# Patient Record
Sex: Female | Born: 1940 | Race: Black or African American | Hispanic: No | Marital: Married | Smoking: Former smoker
Health system: Southern US, Community
[De-identification: ages and names within clinical notes are randomized; demographics above are authoritative.]

## PROBLEM LIST (undated history)

## (undated) DIAGNOSIS — K5790 Diverticulosis of intestine, part unspecified, without perforation or abscess without bleeding: Secondary | ICD-10-CM

## (undated) DIAGNOSIS — D259 Leiomyoma of uterus, unspecified: Secondary | ICD-10-CM

## (undated) DIAGNOSIS — M1990 Unspecified osteoarthritis, unspecified site: Secondary | ICD-10-CM

## (undated) DIAGNOSIS — R739 Hyperglycemia, unspecified: Secondary | ICD-10-CM

## (undated) DIAGNOSIS — R011 Cardiac murmur, unspecified: Secondary | ICD-10-CM

## (undated) DIAGNOSIS — E119 Type 2 diabetes mellitus without complications: Secondary | ICD-10-CM

## (undated) DIAGNOSIS — K219 Gastro-esophageal reflux disease without esophagitis: Secondary | ICD-10-CM

## (undated) DIAGNOSIS — N950 Postmenopausal bleeding: Secondary | ICD-10-CM

## (undated) DIAGNOSIS — Z87442 Personal history of urinary calculi: Secondary | ICD-10-CM

## (undated) DIAGNOSIS — F419 Anxiety disorder, unspecified: Secondary | ICD-10-CM

## (undated) DIAGNOSIS — E118 Type 2 diabetes mellitus with unspecified complications: Secondary | ICD-10-CM

## (undated) DIAGNOSIS — S82409A Unspecified fracture of shaft of unspecified fibula, initial encounter for closed fracture: Secondary | ICD-10-CM

## (undated) DIAGNOSIS — K760 Fatty (change of) liver, not elsewhere classified: Secondary | ICD-10-CM

## (undated) DIAGNOSIS — E785 Hyperlipidemia, unspecified: Secondary | ICD-10-CM

## (undated) DIAGNOSIS — R7303 Prediabetes: Secondary | ICD-10-CM

## (undated) DIAGNOSIS — Z86010 Personal history of colonic polyps: Secondary | ICD-10-CM

## (undated) DIAGNOSIS — Z87891 Personal history of nicotine dependence: Secondary | ICD-10-CM

## (undated) DIAGNOSIS — K2970 Gastritis, unspecified, without bleeding: Secondary | ICD-10-CM

## (undated) DIAGNOSIS — I10 Essential (primary) hypertension: Secondary | ICD-10-CM

## (undated) DIAGNOSIS — H269 Unspecified cataract: Secondary | ICD-10-CM

## (undated) DIAGNOSIS — M069 Rheumatoid arthritis, unspecified: Secondary | ICD-10-CM

## (undated) DIAGNOSIS — B029 Zoster without complications: Secondary | ICD-10-CM

## (undated) HISTORY — DX: Personal history of colonic polyps: Z86.010

## (undated) HISTORY — DX: Unspecified fracture of shaft of unspecified fibula, initial encounter for closed fracture: S82.409A

## (undated) HISTORY — DX: Zoster without complications: B02.9

## (undated) HISTORY — DX: Hyperlipidemia, unspecified: E78.5

## (undated) HISTORY — DX: Unspecified cataract: H26.9

## (undated) HISTORY — PX: CHOLECYSTECTOMY: SHX55

## (undated) HISTORY — DX: Type 2 diabetes mellitus without complications: E11.9

## (undated) HISTORY — PX: OTHER SURGICAL HISTORY: SHX169

## (undated) HISTORY — DX: Cardiac murmur, unspecified: R01.1

## (undated) HISTORY — PX: TUBAL LIGATION: SHX77

## (undated) HISTORY — DX: Anxiety disorder, unspecified: F41.9

## (undated) HISTORY — DX: Essential (primary) hypertension: I10

## (undated) HISTORY — DX: Gastro-esophageal reflux disease without esophagitis: K21.9

## (undated) HISTORY — DX: Rheumatoid arthritis, unspecified: M06.9

## (undated) HISTORY — DX: Personal history of urinary calculi: Z87.442

## (undated) HISTORY — DX: Diverticulosis of intestine, part unspecified, without perforation or abscess without bleeding: K57.90

## (undated) HISTORY — DX: Unspecified osteoarthritis, unspecified site: M19.90

## (undated) HISTORY — PX: UPPER GASTROINTESTINAL ENDOSCOPY: SHX188

## (undated) HISTORY — DX: Gastritis, unspecified, without bleeding: K29.70

## (undated) HISTORY — DX: Personal history of nicotine dependence: Z87.891

---

## 1992-10-20 HISTORY — PX: OTHER SURGICAL HISTORY: SHX169

## 1996-10-20 DIAGNOSIS — Z860100 Personal history of colon polyps, unspecified: Secondary | ICD-10-CM

## 1996-10-20 HISTORY — DX: Personal history of colonic polyps: Z86.010

## 1996-10-20 HISTORY — DX: Personal history of colon polyps, unspecified: Z86.0100

## 1997-06-02 ENCOUNTER — Encounter: Payer: Self-pay | Admitting: Gastroenterology

## 1997-12-12 ENCOUNTER — Ambulatory Visit (HOSPITAL_COMMUNITY): Admission: RE | Admit: 1997-12-12 | Discharge: 1997-12-12 | Payer: Self-pay | Admitting: Family Medicine

## 1998-09-12 ENCOUNTER — Encounter: Payer: Self-pay | Admitting: Family Medicine

## 1998-09-12 ENCOUNTER — Ambulatory Visit (HOSPITAL_COMMUNITY): Admission: RE | Admit: 1998-09-12 | Discharge: 1998-09-12 | Payer: Self-pay | Admitting: Family Medicine

## 2000-12-10 ENCOUNTER — Other Ambulatory Visit: Admission: RE | Admit: 2000-12-10 | Discharge: 2000-12-10 | Payer: Self-pay | Admitting: *Deleted

## 2000-12-11 ENCOUNTER — Other Ambulatory Visit: Admission: RE | Admit: 2000-12-11 | Discharge: 2000-12-11 | Payer: Self-pay | Admitting: *Deleted

## 2000-12-11 ENCOUNTER — Encounter (INDEPENDENT_AMBULATORY_CARE_PROVIDER_SITE_OTHER): Payer: Self-pay

## 2000-12-18 HISTORY — PX: OTHER SURGICAL HISTORY: SHX169

## 2000-12-30 ENCOUNTER — Other Ambulatory Visit: Admission: RE | Admit: 2000-12-30 | Discharge: 2000-12-30 | Payer: Self-pay | Admitting: Obstetrics and Gynecology

## 2000-12-30 ENCOUNTER — Encounter (INDEPENDENT_AMBULATORY_CARE_PROVIDER_SITE_OTHER): Payer: Self-pay | Admitting: Specialist

## 2001-10-20 DIAGNOSIS — K2970 Gastritis, unspecified, without bleeding: Secondary | ICD-10-CM

## 2001-10-20 HISTORY — DX: Gastritis, unspecified, without bleeding: K29.70

## 2002-07-09 ENCOUNTER — Emergency Department (HOSPITAL_COMMUNITY): Admission: EM | Admit: 2002-07-09 | Discharge: 2002-07-09 | Payer: Self-pay | Admitting: Emergency Medicine

## 2002-07-10 ENCOUNTER — Encounter: Payer: Self-pay | Admitting: Emergency Medicine

## 2002-07-22 ENCOUNTER — Inpatient Hospital Stay (HOSPITAL_COMMUNITY): Admission: EM | Admit: 2002-07-22 | Discharge: 2002-07-24 | Payer: Self-pay | Admitting: *Deleted

## 2002-08-20 HISTORY — PX: ESOPHAGOGASTRODUODENOSCOPY: SHX1529

## 2002-08-22 ENCOUNTER — Encounter: Payer: Self-pay | Admitting: Gastroenterology

## 2002-10-05 ENCOUNTER — Encounter: Payer: Self-pay | Admitting: Family Medicine

## 2002-10-05 ENCOUNTER — Ambulatory Visit (HOSPITAL_COMMUNITY): Admission: RE | Admit: 2002-10-05 | Discharge: 2002-10-05 | Payer: Self-pay | Admitting: Family Medicine

## 2002-11-20 HISTORY — PX: LIPOMA EXCISION: SHX5283

## 2002-12-02 ENCOUNTER — Ambulatory Visit (HOSPITAL_COMMUNITY): Admission: RE | Admit: 2002-12-02 | Discharge: 2002-12-02 | Payer: Self-pay | Admitting: General Surgery

## 2002-12-02 ENCOUNTER — Encounter (INDEPENDENT_AMBULATORY_CARE_PROVIDER_SITE_OTHER): Payer: Self-pay | Admitting: *Deleted

## 2002-12-23 ENCOUNTER — Encounter: Payer: Self-pay | Admitting: Orthopedic Surgery

## 2002-12-23 ENCOUNTER — Encounter: Admission: RE | Admit: 2002-12-23 | Discharge: 2002-12-23 | Payer: Self-pay | Admitting: Orthopedic Surgery

## 2003-01-11 ENCOUNTER — Encounter: Admission: RE | Admit: 2003-01-11 | Discharge: 2003-01-11 | Payer: Self-pay | Admitting: Orthopedic Surgery

## 2003-01-11 ENCOUNTER — Encounter: Payer: Self-pay | Admitting: Orthopedic Surgery

## 2003-02-03 ENCOUNTER — Encounter: Payer: Self-pay | Admitting: Orthopedic Surgery

## 2003-02-03 ENCOUNTER — Encounter: Admission: RE | Admit: 2003-02-03 | Discharge: 2003-02-03 | Payer: Self-pay | Admitting: Orthopedic Surgery

## 2003-05-19 ENCOUNTER — Encounter: Admission: RE | Admit: 2003-05-19 | Discharge: 2003-05-19 | Payer: Self-pay | Admitting: Orthopedic Surgery

## 2003-05-19 ENCOUNTER — Encounter: Payer: Self-pay | Admitting: Orthopedic Surgery

## 2003-06-07 ENCOUNTER — Encounter: Admission: RE | Admit: 2003-06-07 | Discharge: 2003-06-07 | Payer: Self-pay | Admitting: Orthopedic Surgery

## 2003-06-07 ENCOUNTER — Encounter: Payer: Self-pay | Admitting: Orthopedic Surgery

## 2003-06-07 ENCOUNTER — Encounter: Payer: Self-pay | Admitting: Radiology

## 2003-07-07 ENCOUNTER — Encounter: Payer: Self-pay | Admitting: Orthopedic Surgery

## 2003-07-07 ENCOUNTER — Encounter: Admission: RE | Admit: 2003-07-07 | Discharge: 2003-07-07 | Payer: Self-pay | Admitting: Orthopedic Surgery

## 2004-04-19 ENCOUNTER — Ambulatory Visit (HOSPITAL_COMMUNITY): Admission: RE | Admit: 2004-04-19 | Discharge: 2004-04-19 | Payer: Self-pay | Admitting: Neurosurgery

## 2004-05-16 ENCOUNTER — Encounter: Admission: RE | Admit: 2004-05-16 | Discharge: 2004-05-16 | Payer: Self-pay | Admitting: Neurological Surgery

## 2004-07-04 ENCOUNTER — Encounter: Admission: RE | Admit: 2004-07-04 | Discharge: 2004-07-04 | Payer: Self-pay | Admitting: Neurosurgery

## 2004-10-10 ENCOUNTER — Ambulatory Visit: Payer: Self-pay | Admitting: Family Medicine

## 2005-02-17 DIAGNOSIS — S82409A Unspecified fracture of shaft of unspecified fibula, initial encounter for closed fracture: Secondary | ICD-10-CM

## 2005-02-17 HISTORY — DX: Unspecified fracture of shaft of unspecified fibula, initial encounter for closed fracture: S82.409A

## 2005-03-20 ENCOUNTER — Ambulatory Visit: Payer: Self-pay | Admitting: Family Medicine

## 2005-08-14 ENCOUNTER — Ambulatory Visit: Payer: Self-pay | Admitting: Gastroenterology

## 2006-04-04 ENCOUNTER — Emergency Department (HOSPITAL_COMMUNITY): Admission: EM | Admit: 2006-04-04 | Discharge: 2006-04-05 | Payer: Self-pay | Admitting: Emergency Medicine

## 2006-04-07 ENCOUNTER — Ambulatory Visit: Payer: Self-pay | Admitting: Family Medicine

## 2006-04-21 ENCOUNTER — Ambulatory Visit: Payer: Self-pay | Admitting: Family Medicine

## 2006-05-27 ENCOUNTER — Ambulatory Visit: Payer: Self-pay | Admitting: Family Medicine

## 2006-06-08 ENCOUNTER — Ambulatory Visit: Payer: Self-pay | Admitting: Family Medicine

## 2006-06-17 ENCOUNTER — Ambulatory Visit: Payer: Self-pay | Admitting: Gastroenterology

## 2006-09-04 ENCOUNTER — Other Ambulatory Visit: Admission: RE | Admit: 2006-09-04 | Discharge: 2006-09-04 | Payer: Self-pay | Admitting: Family Medicine

## 2006-09-04 ENCOUNTER — Encounter: Payer: Self-pay | Admitting: Family Medicine

## 2006-09-04 ENCOUNTER — Ambulatory Visit: Payer: Self-pay | Admitting: Family Medicine

## 2006-09-07 ENCOUNTER — Ambulatory Visit (HOSPITAL_COMMUNITY): Admission: RE | Admit: 2006-09-07 | Discharge: 2006-09-07 | Payer: Self-pay | Admitting: Family Medicine

## 2006-09-09 ENCOUNTER — Ambulatory Visit (HOSPITAL_COMMUNITY): Admission: RE | Admit: 2006-09-09 | Discharge: 2006-09-09 | Payer: Self-pay | Admitting: Family Medicine

## 2006-09-09 LAB — HM MAMMOGRAPHY: HM Mammogram: NORMAL

## 2006-11-06 ENCOUNTER — Ambulatory Visit: Payer: Self-pay | Admitting: Family Medicine

## 2007-01-13 ENCOUNTER — Ambulatory Visit: Payer: Self-pay | Admitting: Family Medicine

## 2007-03-05 ENCOUNTER — Encounter: Admission: RE | Admit: 2007-03-05 | Discharge: 2007-03-05 | Payer: Self-pay | Admitting: Orthopedic Surgery

## 2007-03-19 ENCOUNTER — Ambulatory Visit: Payer: Self-pay | Admitting: Family Medicine

## 2007-03-19 DIAGNOSIS — F17200 Nicotine dependence, unspecified, uncomplicated: Secondary | ICD-10-CM | POA: Insufficient documentation

## 2007-03-24 ENCOUNTER — Encounter: Admission: RE | Admit: 2007-03-24 | Discharge: 2007-03-24 | Payer: Self-pay | Admitting: Orthopedic Surgery

## 2007-04-09 ENCOUNTER — Encounter: Admission: RE | Admit: 2007-04-09 | Discharge: 2007-04-09 | Payer: Self-pay | Admitting: Orthopedic Surgery

## 2007-07-09 ENCOUNTER — Ambulatory Visit: Payer: Self-pay | Admitting: Family Medicine

## 2007-07-09 DIAGNOSIS — K219 Gastro-esophageal reflux disease without esophagitis: Secondary | ICD-10-CM | POA: Insufficient documentation

## 2007-07-14 ENCOUNTER — Encounter: Payer: Self-pay | Admitting: Family Medicine

## 2007-07-14 LAB — CONVERTED CEMR LAB
Anti Nuclear Antibody(ANA): POSITIVE — AB
Basophils Absolute: 0.1 10*3/uL (ref 0.0–0.1)
Basophils Relative: 1 % (ref 0–1)
Eosinophils Absolute: 0.1 10*3/uL (ref 0.0–0.7)
MCHC: 32.5 g/dL (ref 30.0–36.0)
Monocytes Absolute: 0.5 10*3/uL (ref 0.2–0.7)
Neutro Abs: 3.8 10*3/uL (ref 1.7–7.7)
Neutrophils Relative %: 59 % (ref 43–77)
RDW: 13.9 % (ref 11.5–14.0)
Rhuematoid fact SerPl-aCnc: 55 intl units/mL — ABNORMAL HIGH (ref 0–20)

## 2007-07-23 ENCOUNTER — Ambulatory Visit: Payer: Self-pay | Admitting: Family Medicine

## 2007-08-06 ENCOUNTER — Ambulatory Visit: Payer: Self-pay | Admitting: Gastroenterology

## 2007-08-21 HISTORY — PX: COLONOSCOPY: SHX174

## 2007-08-24 ENCOUNTER — Encounter: Payer: Self-pay | Admitting: Family Medicine

## 2007-08-27 ENCOUNTER — Ambulatory Visit: Payer: Self-pay | Admitting: Gastroenterology

## 2007-08-27 ENCOUNTER — Encounter: Payer: Self-pay | Admitting: Family Medicine

## 2007-08-30 ENCOUNTER — Ambulatory Visit: Payer: Self-pay | Admitting: Family Medicine

## 2007-09-28 ENCOUNTER — Encounter: Payer: Self-pay | Admitting: Family Medicine

## 2007-11-30 ENCOUNTER — Telehealth: Payer: Self-pay | Admitting: Family Medicine

## 2007-11-30 ENCOUNTER — Encounter: Payer: Self-pay | Admitting: Family Medicine

## 2008-02-24 ENCOUNTER — Ambulatory Visit: Payer: Self-pay | Admitting: Gastroenterology

## 2008-02-29 ENCOUNTER — Encounter: Payer: Self-pay | Admitting: Family Medicine

## 2008-05-04 ENCOUNTER — Ambulatory Visit: Payer: Self-pay | Admitting: Family Medicine

## 2008-10-16 ENCOUNTER — Ambulatory Visit: Payer: Self-pay | Admitting: Family Medicine

## 2008-10-17 LAB — CONVERTED CEMR LAB
Albumin: 3.9 g/dL (ref 3.5–5.2)
Alkaline Phosphatase: 61 units/L (ref 39–117)
Basophils Absolute: 0.1 10*3/uL (ref 0.0–0.1)
Basophils Relative: 1 % (ref 0.0–3.0)
CO2: 31 meq/L (ref 19–32)
Chloride: 104 meq/L (ref 96–112)
Creatinine, Ser: 0.8 mg/dL (ref 0.4–1.2)
Eosinophils Absolute: 0.1 10*3/uL (ref 0.0–0.7)
GFR calc Af Amer: 92 mL/min
GFR calc non Af Amer: 76 mL/min
HCT: 40 % (ref 36.0–46.0)
MCHC: 34.2 g/dL (ref 30.0–36.0)
MCV: 91.1 fL (ref 78.0–100.0)
Monocytes Absolute: 0.5 10*3/uL (ref 0.1–1.0)
Neutrophils Relative %: 57.7 % (ref 43.0–77.0)
Potassium: 3.8 meq/L (ref 3.5–5.1)
RBC: 4.38 M/uL (ref 3.87–5.11)
Sodium: 140 meq/L (ref 135–145)

## 2008-11-23 ENCOUNTER — Ambulatory Visit: Payer: Self-pay | Admitting: Family Medicine

## 2008-12-19 ENCOUNTER — Encounter: Payer: Self-pay | Admitting: Family Medicine

## 2008-12-28 ENCOUNTER — Telehealth: Payer: Self-pay | Admitting: Family Medicine

## 2009-01-03 ENCOUNTER — Ambulatory Visit: Payer: Self-pay | Admitting: Family Medicine

## 2009-01-11 ENCOUNTER — Telehealth (INDEPENDENT_AMBULATORY_CARE_PROVIDER_SITE_OTHER): Payer: Self-pay | Admitting: Internal Medicine

## 2009-03-28 ENCOUNTER — Ambulatory Visit: Payer: Self-pay | Admitting: Family Medicine

## 2009-04-17 ENCOUNTER — Encounter: Payer: Self-pay | Admitting: Family Medicine

## 2009-05-04 ENCOUNTER — Telehealth: Payer: Self-pay | Admitting: Gastroenterology

## 2009-05-16 ENCOUNTER — Encounter: Admission: RE | Admit: 2009-05-16 | Discharge: 2009-05-16 | Payer: Self-pay | Admitting: Pulmonary Disease

## 2009-06-15 ENCOUNTER — Ambulatory Visit: Payer: Self-pay | Admitting: Gastroenterology

## 2009-09-17 ENCOUNTER — Ambulatory Visit: Payer: Self-pay | Admitting: Gastroenterology

## 2009-09-17 ENCOUNTER — Encounter (INDEPENDENT_AMBULATORY_CARE_PROVIDER_SITE_OTHER): Payer: Self-pay | Admitting: *Deleted

## 2009-09-21 ENCOUNTER — Ambulatory Visit: Payer: Self-pay | Admitting: Gastroenterology

## 2009-10-20 DIAGNOSIS — Z8679 Personal history of other diseases of the circulatory system: Secondary | ICD-10-CM

## 2009-10-20 HISTORY — PX: OTHER SURGICAL HISTORY: SHX169

## 2009-10-20 HISTORY — DX: Personal history of other diseases of the circulatory system: Z86.79

## 2009-11-09 ENCOUNTER — Ambulatory Visit: Payer: Self-pay | Admitting: Family Medicine

## 2009-11-09 DIAGNOSIS — L708 Other acne: Secondary | ICD-10-CM

## 2009-12-10 ENCOUNTER — Ambulatory Visit: Payer: Self-pay | Admitting: Family Medicine

## 2009-12-10 DIAGNOSIS — J1189 Influenza due to unidentified influenza virus with other manifestations: Secondary | ICD-10-CM

## 2009-12-10 DIAGNOSIS — J019 Acute sinusitis, unspecified: Secondary | ICD-10-CM

## 2009-12-11 ENCOUNTER — Telehealth: Payer: Self-pay | Admitting: Family Medicine

## 2009-12-13 ENCOUNTER — Encounter: Payer: Self-pay | Admitting: Family Medicine

## 2009-12-13 ENCOUNTER — Telehealth: Payer: Self-pay | Admitting: Family Medicine

## 2009-12-26 ENCOUNTER — Telehealth: Payer: Self-pay | Admitting: Family Medicine

## 2009-12-26 ENCOUNTER — Encounter: Payer: Self-pay | Admitting: Family Medicine

## 2010-01-15 ENCOUNTER — Encounter: Payer: Self-pay | Admitting: Family Medicine

## 2010-02-13 ENCOUNTER — Encounter: Payer: Self-pay | Admitting: Family Medicine

## 2010-07-05 ENCOUNTER — Telehealth: Payer: Self-pay | Admitting: Gastroenterology

## 2010-09-05 ENCOUNTER — Ambulatory Visit: Payer: Self-pay | Admitting: Family Medicine

## 2010-10-20 DIAGNOSIS — B029 Zoster without complications: Secondary | ICD-10-CM

## 2010-10-20 HISTORY — DX: Zoster without complications: B02.9

## 2010-10-29 ENCOUNTER — Telehealth: Payer: Self-pay | Admitting: Family Medicine

## 2010-10-31 ENCOUNTER — Telehealth: Payer: Self-pay | Admitting: Gastroenterology

## 2010-10-31 ENCOUNTER — Telehealth: Payer: Self-pay | Admitting: Family Medicine

## 2010-11-05 ENCOUNTER — Ambulatory Visit
Admission: RE | Admit: 2010-11-05 | Discharge: 2010-11-05 | Payer: Self-pay | Source: Home / Self Care | Attending: Family Medicine | Admitting: Family Medicine

## 2010-11-19 NOTE — Miscellaneous (Signed)
Summary: Med update   Clinical Lists Changes  Medications: Removed medication of FEXOFENADINE HCL 180 MG TABS (FEXOFENADINE HCL) 1 by mouth once daily     Prior Medications: MYLANTA  TABS (CALCIUM & MAGNESIUM CARBONATES TABS) take by mouth as directed TYLENOL 325 MG TABS (ACETAMINOPHEN) take by mouth as directed ALPRAZOLAM 0.25 MG TABS (ALPRAZOLAM) take one by mouth three times a day prn EPIPEN  DEVI (EPINEPHRINE HCL (ANAPHYLAXIS) DEVI) use as directed prn METHOTREXATE 2.5 MG  TABS (METHOTREXATE SODIUM) Take 9 tablets by mouth every week NEXIUM 40 MG  CPDR (ESOMEPRAZOLE MAGNESIUM) 1 capsule twice a day 30 minutes before meals FOLIC ACID 1 MG TABS (FOLIC ACID) Take 1 tablet by mouth once a day FLONASE 50 MCG/ACT SUSP (FLUTICASONE PROPIONATE) 2 sprays in each nostril once daily as needed PROAIR HFA 108 (90 BASE) MCG/ACT AERS (ALBUTEROL SULFATE) 2 puffs up to every 4 hours as needed wheezing ASTEPRO 0.15 % SOLN (AZELASTINE HCL) use 2 spays two times a day as needed NASACORT AQ 55 MCG/ACT AERS (TRIAMCINOLONE ACETONIDE(NASAL)) once daily as needed FLEXERIL 10 MG TABS (CYCLOBENZAPRINE HCL) 1 by mouth at bedtime for neck and back pain as needed ZITHROMAX 250 MG TABS (AZITHROMYCIN) take by mouth as directed Current Allergies: ! CLARITIN ! ZYRTEC ALLERGY * YELLOW JACKET

## 2010-11-19 NOTE — Letter (Signed)
Summary: [name]  [facility]   Imported By: Maryln Gottron 01/23/2010 14:22:52  _____________________________________________________________________  External Attachment:    Type:   Image     Comment:   External Document

## 2010-11-19 NOTE — Letter (Signed)
Summary: [facility]  [facility]   Imported By: Lanelle Bal 02/25/2010 10:21:31  _____________________________________________________________________  External Attachment:    Type:   Image     Comment:   External Document

## 2010-11-19 NOTE — Progress Notes (Signed)
Summary: prior Berkley Harvey is needed for clarinex  Phone Note From Pharmacy   [facility]er[facility] Summary of Call: Prior auth is needed for clarinex, form is on your shelf. Initial call taken by: Lowella Petties CMA,  December 13, 2009 9:32 AM  Follow-up for Phone Call        I filled it out- please call her to fill in dates of trial of other meds - thanks  Follow-up by: Judith Part MD,  December 13, 2009 1:50 PM  Additional Follow-up for Phone Call Additional follow up Details #1::        Pt said tried Zyrtec in 10/29/2008 and it did not work. Tried Claritin in 12/09/2008 and it did not work. completed form faxed to [phone].Lewanda Rife LPN  December 13, 2009 2:26 PM      Appended Document: prior Berkley Harvey is needed for clarinex Received PA Approval for Clarinex 5mg .  Approved through 12/13/2010, patient and pharmacy notified.

## 2010-11-19 NOTE — Progress Notes (Signed)
Summary: rx not in pharmacy   Phone Note Call from Patient Call back at Home Phone [phone]   Caller: Patient Call For: Dr Russella Dar Reason for Call: Talk to Nurse Summary of Call: Patient states that her rx is still not in her pharmacy please resend it. Initial call taken by: Tawni Levy,  July 05, 2010 10:42 AM  Follow-up for Phone Call        called the pharm and they state that they never got any of the refills until today, so I called the rx to the pharm and advised the patient the rx was called in . Follow-up by: Harlow Mares CMA Duncan Dull),  July 05, 2010 1:33 PM

## 2010-11-19 NOTE — Progress Notes (Signed)
Summary: Clarinex Tier 3 med  Phone Note Call from Patient Call back at [phone]   Caller: Patient Call For: Judith Part MD Summary of Call: Pt said Clarinex was a Tier 3 and would cost $85.00. Pt spoke with insurance co and was told it could be dropped to a Tier 2  and cost $45.00 if Dr Milinda Antis would call [phone]. Pt is using [facility]. Please advise.  Initial call taken by: Lewanda Rife LPN,  December 11, 2009 5:04 PM  Follow-up for Phone Call        I cannot call the number but get the prior auth form and I will fill it out  Follow-up by: Judith Part MD,  December 11, 2009 7:47 PM  Additional Follow-up for Phone Call Additional follow up Details #1::        Patient notified as instructed by telephone. Lewanda Rife LPN  December 12, 2009 8:21 AM

## 2010-11-19 NOTE — Medication Information (Signed)
Summary: Approval for Clarinex/[facility]  Approval for Clarinex/[facility]   Imported By: Lanelle Bal 12/18/2009 08:10:59  _____________________________________________________________________  External Attachment:    Type:   Image     Comment:   External Document

## 2010-11-19 NOTE — Assessment & Plan Note (Signed)
Summary: CHECK MOLE,NECK PAIN/CLE   Vital Signs:  Patient profile:   70 year old female Weight:      198 pounds BMI:     31.12 Temp:     97.9 degrees F oral Pulse rate:   76 / minute Pulse rhythm:   regular BP sitting:   160 / 90  (left arm) Cuff size:   regular  Vitals Entered By: Lowella Petties CMA (November 09, 2009 2:05 PM) CC: Check mole between breasts, also has left side neck pain.   History of Present Illness: has mole between her breasts for years now is sore and changing color - but has not bled worse for about a month ago   neck is sore on the L hand side  that is about 1-2 months thinks she slept on it funny  is occ rad into her shoulder  pain is worst to turn her head L   occ gets chills all over  wants me to check her ears   is doing pretty well with her rheumatoid arthritis   Allergies: 1)  ! Claritin 2)  * Yellow Jacket  Past History:  Past Medical History: Last updated: 06/15/2009 Allergic rhinitis Osteoarthritis- LS Nephrolithiasis, hx of RA gastritis  hyperlipidemia  former smoker  Hemorrhoids Hyperplastic Polyp 1998 GERD Fibula fracture (02/2005)  Past Surgical History: Last updated: 06/15/2009 Cholecystectomy Vaginal polyp excised - benign (12/2000) Lipoma removed (11/2002) SVT s/p surgery- ? ablation  ABD [place]- neg (08/1998) EP provedure- SVT, normal echo (1994) Cardiolite- pos (EKG only, scan neg. 01/2000) Cardiolite- neg EF 74% (07/2002) EGD- reactive gastropathy (08/2002) Cardiolite stress - neg EF 80% CT head- normal (01/2006) Dexa- o. k.  (08/2006) colonoscopy 08/2007 int hemorrhoids  Family History: Last updated: 2009/07/06 Father: Deceased, colon cancer diag age 8 Mother: deceased- DM,CAD, ESRD, CVA Siblings: 1 brother- HTN, 5 sisters- 1 with breast ca  Social History: Last updated: 09/17/2009 Former Smoker-stopped 7 years ago married  Occupation: retired Illicit Drug Use - no Daily Caffeine Use   occ Alcohol Use - no Patient does not get regular exercise.   Risk Factors: Exercise: no (09/17/2009)  Risk Factors: Smoking Status: quit (03/19/2007)  Review of Systems General:  Denies chills, fatigue, fever, loss of appetite, and malaise. Eyes:  Denies blurring and eye irritation. CV:  Denies chest pain or discomfort and palpitations. Resp:  Denies cough and shortness of breath. GI:  Denies abdominal pain and indigestion. MS:  Complains of muscle aches and stiffness; denies joint redness, joint swelling, cramps, and muscle weakness. Derm:  Complains of itching and lesion(s); denies poor wound healing and rash. Neuro:  Denies numbness and tingling. Heme:  Denies abnormal bruising and bleeding.  Physical Exam  General:  Well-developed,well-nourished,in no acute distress; alert,appropriate and cooperative throughout examination Head:  normocephalic, atraumatic, and no abnormalities observed.   Eyes:  vision grossly intact, pupils equal, pupils round, and pupils reactive to light.   Mouth:  pharynx pink and moist.   Neck:  supple with full rom and no masses or thyromegally, no JVD or carotid bruit  Lungs:  normal respiratory effort, no intercostal retractions, no accessory muscle use, normal breath sounds, no crackles, and no wheezes.   Heart:  RRR with soft systolic M  Msk:  tender trap and rhomboid area L side - with spasm noted  pain to flex and rotate head left  no bony tenderness of neck or spine  nl rom UEs   Extremities:  No clubbing, cyanosis, edema, or  deformity noted with normal full range of motion of all joints.   Neurologic:  strength normal in all extremities, sensation intact to light touch, gait normal, and DTRs symmetrical and normal.   Skin:  comedone - expressed with sebaceous material between breasts (.5 cm)  no redness  slight odor  Cervical Nodes:  No lymphadenopathy noted Psych:  normal affect, talkative and pleasant    Impression &  Recommendations:  Problem # 1:  COMEDO (ICD-706.1) bothersome - was expressed and dressed in office with relief adv to dress with abx ointment   Problem # 2:  CERVICALGIA (ICD-723.1) Assessment: New neck pain with trapezius (and poss rhomboid ) pain and spasm pt reluctant to go to PT but this is ultimately what I recommend  warm compress/cerv supp pillow recommended short trial of flexeril  call when ready to sched PT  update if worse  Her updated medication list for this problem includes:    Tylenol 325 Mg Tabs (Acetaminophen) .Marland Kitchen... Take by mouth as directed    Flexeril 10 Mg Tabs (Cyclobenzaprine hcl) .Marland Kitchen... 1 by mouth at bedtime for neck and back pain as needed  Complete Medication List: 1)  Mylanta Tabs (Calcium & magnesium carbonates tabs) .... Take by mouth as directed 2)  Tylenol 325 Mg Tabs (Acetaminophen) .... Take by mouth as directed 3)  Alprazolam 0.25 Mg Tabs (Alprazolam) .... Take one by mouth three times a day prn 4)  Epipen Devi (Epinephrine hcl (anaphylaxis) devi) .... Use as directed prn 5)  Methotrexate 2.5 Mg Tabs (Methotrexate sodium) .... Take 9 tablets by mouth every week 6)  Nexium 40 Mg Cpdr (Esomeprazole magnesium) .Marland Kitchen.. 1 capsule twice a day 30 minutes before meals 7)  Folic Acid 1 Mg Tabs (Folic acid) .... Take 1 tablet by mouth once a day 8)  Flonase 50 Mcg/act Susp (Fluticasone propionate) .... 2 sprays in each nostril once daily 9)  Proair Hfa 108 (90 Base) Mcg/act Aers (Albuterol sulfate) .... 2 puffs up to every 4 hours as needed wheezing 10)  Astepro 0.15 % Soln (Azelastine hcl) .... Use 2 spays two times a day 11)  Nasacort Aq 55 Mcg/act Aers (Triamcinolone acetonide(nasal)) .... Once daily 12)  Fexofenadine Hcl 180 Mg Tabs (Fexofenadine hcl) .Marland Kitchen.. 1 by mouth once daily 13)  Flexeril 10 Mg Tabs (Cyclobenzaprine hcl) .Marland Kitchen.. 1 by mouth at bedtime for neck and back pain as needed  Patient Instructions: 1)  I recommend warm compresses on neck and back for  muscle spasm 2)  use cervical support pillow  3)  try flexeril -- muscle relaxer as needed (is sedating) 4)  if not improving - call for PT referral 5)  keep spot on chest clean and dry  6)  use antibiotic oitment (like polysporin) until it is healed  Prescriptions: FLEXERIL 10 MG TABS (CYCLOBENZAPRINE HCL) 1 by mouth at bedtime for neck and back pain as needed  #30 x 0   Entered and Authorized by:   Judith Part MD   Signed by:   Judith Part MD on 11/09/2009   Method used:   Print then Give to Patient   RxID:   [id number]   Prior Medications (reviewed today): MYLANTA  TABS (CALCIUM & MAGNESIUM CARBONATES TABS) take by mouth as directed TYLENOL 325 MG TABS (ACETAMINOPHEN) take by mouth as directed ALPRAZOLAM 0.25 MG TABS (ALPRAZOLAM) take one by mouth three times a day prn EPIPEN  DEVI (EPINEPHRINE HCL (ANAPHYLAXIS) DEVI) use as directed prn METHOTREXATE 2.5 MG  TABS (METHOTREXATE SODIUM) Take 9 tablets by mouth every week NEXIUM 40 MG  CPDR (ESOMEPRAZOLE MAGNESIUM) 1 capsule twice a day 30 minutes before meals FOLIC ACID 1 MG TABS (FOLIC ACID) Take 1 tablet by mouth once a day FLONASE 50 MCG/ACT SUSP (FLUTICASONE PROPIONATE) 2 sprays in each nostril once daily PROAIR HFA 108 (90 BASE) MCG/ACT AERS (ALBUTEROL SULFATE) 2 puffs up to every 4 hours as needed wheezing ASTEPRO 0.15 % SOLN (AZELASTINE HCL) use 2 spays two times a day NASACORT AQ 55 MCG/ACT AERS (TRIAMCINOLONE ACETONIDE(NASAL)) once daily FEXOFENADINE HCL 180 MG TABS (FEXOFENADINE HCL) 1 by mouth once daily FLEXERIL 10 MG TABS (CYCLOBENZAPRINE HCL) 1 by mouth at bedtime for neck and back pain as needed Current Allergies: ! CLARITIN * YELLOW JACKET

## 2010-11-19 NOTE — Progress Notes (Signed)
Summary: Allegra 180mg  rx  Phone Note Call from Patient   Summary of Call: [employer] no longer covers clarinex and needs to change to allegra -- I got fax about this   Follow-up for Phone Call        will px written on EMR for call in - for allegra and change on emr Follow-up by: Judith Part MD,  December 26, 2009 1:54 PM  Additional Follow-up for Phone Call Additional follow up Details #1::        Patient notified as instructed by telephone. Medication phoned to [facility]  pharmacy as instructed. Lewanda Rife LPN  December 27, 2723 4:21 PM     New/Updated Medications: ALLEGRA 180 MG TABS (FEXOFENADINE HCL) 1 by mouth once daily as needed allergies Prescriptions: ALLEGRA 180 MG TABS (FEXOFENADINE HCL) 1 by mouth once daily as needed allergies  #30 x 11   Entered and Authorized by:   Judith Part MD   Signed by:   Lewanda Rife LPN on 36/64/4034   Method used:   Telephoned to ...       [facility] (retail)       [address]       [place], [place]  [address]       Ph: [phone] or [phone]       Fax: [phone]   RxID:   [id number]

## 2010-11-19 NOTE — Assessment & Plan Note (Signed)
Summary: 11:00 EARS/CLE   Vital Signs:  Patient profile:   70 year old female Weight:      200.25 pounds Temp:     98.2 degrees F oral Pulse rate:   74 / minute Pulse rhythm:   regular BP sitting:   130 / 80  (left arm) Cuff size:   large  Vitals Entered By: Selena Batten Dance CMA (AAMA) (September 05, 2010 11:11 AM) CC: Check ears   History of Present Illness: CC: blood in ears?  Sunday clearning ears with washcloth, noticed slight amt blood out of both ears.  + mild tinnitis.  + pain in both ears.  Also having pain behind ears.  + congestion, h/o allergies.  No draining from ears.  No fevers/chills.  No cough.  no abd pain, n/v/d.  No sick contacts at home.  No smokers at home.  Current Medications (verified): 1)  Mylanta  Tabs (Calcium & Magnesium Carbonates Tabs) .... Take By Mouth As Directed 2)  Tylenol 325 Mg Tabs (Acetaminophen) .... Take By Mouth As Directed 3)  Alprazolam 0.25 Mg Tabs (Alprazolam) .... Take One By Mouth Three Times A Day Prn 4)  Epipen  Devi (Epinephrine Hcl (Anaphylaxis) Devi) .... Use As Directed Prn 5)  Nexium 40 Mg  Cpdr (Esomeprazole Magnesium) .Marland Kitchen.. 1 Capsule Twice A Day 30 Minutes Before Meals 6)  Folic Acid 1 Mg Tabs (Folic Acid) .... Take 1 Tablet By Mouth Once A Day 7)  Proair Hfa 108 (90 Base) Mcg/act Aers (Albuterol Sulfate) .... 2 Puffs Up To Every 4 Hours As Needed Wheezing 8)  Astepro 0.15 % Soln (Azelastine Hcl) .... Use 2 Spays Two Times A Day As Needed 9)  Allegra 180 Mg Tabs (Fexofenadine Hcl) .Marland Kitchen.. 1 By Mouth Once Daily As Needed Allergies  Allergies: 1)  ! Claritin 2)  ! Zyrtec Allergy 3)  * Yellow Jacket  Past History:  Past Medical History: Last updated: 06/15/2009 Allergic rhinitis Osteoarthritis- LS Nephrolithiasis, hx of RA gastritis  hyperlipidemia  former smoker  Hemorrhoids Hyperplastic Polyp 1998 GERD Fibula fracture (02/2005)  Social History: Last updated: 09/17/2009 Former Smoker-stopped 7 years ago married    Occupation: retired Illicit Drug Use - no Daily Caffeine Use  occ Alcohol Use - no Patient does not get regular exercise.   Review of Systems       per hpi  Physical Exam  General:  Well-developed,well-nourished,in no acute distress; alert,appropriate and cooperative throughout examination Head:  normocephalic, atraumatic, and no abnormalities observed.  no sinus tenderness Eyes:  vision grossly intact, pupils equal, pupils round, and pupils reactive to light.  Ears:  R ear normal and L ear normal.  TMs and external canals.  Nose:  External nasal examination shows no deformity or inflammation. Nasal mucosa are pink and moist without lesions or exudates. Mouth:  pharynx pink and moist, no erythema, and no exudates.  some post nasal drip  Neck:  No deformities, masses, or tenderness noted.   Impression & Recommendations:  Problem # 1:  ALLERGIC RHINITIS (ICD-477.9) ear exam normal today.  off flonase.  ? if just saw very dark cerumen.  likely nasal congestion causing muffled hearing.  rec start with nasal saline spray, if not improved, consider adding INCS again.  The following medications were removed from the medication list:    Flonase 50 Mcg/act Susp (Fluticasone propionate) .Marland Kitchen... 2 sprays in each nostril once daily as needed    Nasacort Aq 55 Mcg/act Aers (Triamcinolone acetonide(nasal)) ..... Once daily as needed  Her updated medication list for this problem includes:    Astepro 0.15 % Soln (Azelastine hcl) ..... Use 2 spays two times a day as needed    Allegra 180 Mg Tabs (Fexofenadine hcl) .Marland Kitchen... 1 by mouth once daily as needed allergies  Complete Medication List: 1)  Mylanta Tabs (Calcium & magnesium carbonates tabs) .... Take by mouth as directed 2)  Tylenol 325 Mg Tabs (Acetaminophen) .... Take by mouth as directed 3)  Alprazolam 0.25 Mg Tabs (Alprazolam) .... Take one by mouth three times a day prn 4)  Epipen Devi (Epinephrine hcl (anaphylaxis) devi) .... Use as  directed prn 5)  Nexium 40 Mg Cpdr (Esomeprazole magnesium) .Marland Kitchen.. 1 capsule twice a day 30 minutes before meals 6)  Folic Acid 1 Mg Tabs (Folic acid) .... Take 1 tablet by mouth once a day 7)  Proair Hfa 108 (90 Base) Mcg/act Aers (Albuterol sulfate) .... 2 puffs up to every 4 hours as needed wheezing 8)  Astepro 0.15 % Soln (Azelastine hcl) .... Use 2 spays two times a day as needed 9)  Allegra 180 Mg Tabs (Fexofenadine hcl) .Marland Kitchen.. 1 by mouth once daily as needed allergies  Patient Instructions: 1)  I don't see an infection today. 2)  For allergies, try nasal saline spray or drops.  If not helping, we may try nasal steroids. 3)  Good to see you today.   Orders Added: 1)  Est. Patient Level III [13086]    Current Allergies (reviewed today): ! CLARITIN ! ZYRTEC ALLERGY * YELLOW JACKET

## 2010-11-19 NOTE — Assessment & Plan Note (Signed)
Summary: CONGESTION, HEAD & EAR PAIN / LFW   Vital Signs:  Patient profile:   70 year old female Height:      67 inches Weight:      195.25 pounds BMI:     30.69 Temp:     98 degrees F oral Pulse rate:   76 / minute Pulse rhythm:   regular Resp:     20 per minute BP sitting:   140 / 80  (left arm) Cuff size:   large  Vitals Entered By: Lewanda Rife LPN (December 10, 2009 3:09 PM)  History of Present Illness: last week - started getting sick on tuesday night --started having chills and a bad cough nose running and sinus congestion really sick next day - stayed in her bed   does never get a flu shot   did not take her temp - but did have the chills and the sweats  sinus pain over eyes  ear was hurting  today is first day up and out    ? if had colored nasal discharge  cough is starting to calm down - a little prodcuctive  maybe a bit of wheezing  took delsym for cough   clarinex helps her runny nose - was from allergist  needs px for that   claritin and zyrtec do not help at all   Allergies: 1)  ! Claritin 2)  ! Zyrtec Allergy 3)  * Yellow Jacket  Past History:  Past Medical History: Last updated: 06/15/2009 Allergic rhinitis Osteoarthritis- LS Nephrolithiasis, hx of RA gastritis  hyperlipidemia  former smoker  Hemorrhoids Hyperplastic Polyp 1998 GERD Fibula fracture (02/2005)  Past Surgical History: Last updated: 06/15/2009 Cholecystectomy Vaginal polyp excised - benign (12/2000) Lipoma removed (11/2002) SVT s/p surgery- ? ablation  ABD [place]- neg (08/1998) EP provedure- SVT, normal echo (1994) Cardiolite- pos (EKG only, scan neg. 01/2000) Cardiolite- neg EF 74% (07/2002) EGD- reactive gastropathy (08/2002) Cardiolite stress - neg EF 80% CT head- normal (01/2006) Dexa- o. k.  (08/2006) colonoscopy 08/2007 int hemorrhoids  Family History: Last updated: 06/23/2009 Father: Deceased, colon cancer diag age 62 Mother: deceased- DM,CAD, ESRD,  CVA Siblings: 1 brother- HTN, 5 sisters- 1 with breast ca  Social History: Last updated: 09/17/2009 Former Smoker-stopped 7 years ago married  Occupation: retired Illicit Drug Use - no Daily Caffeine Use  occ Alcohol Use - no Patient does not get regular exercise.   Risk Factors: Exercise: no (09/17/2009)  Risk Factors: Smoking Status: quit (03/19/2007)  Review of Systems General:  Complains of chills, fatigue, fever, loss of appetite, and malaise. Eyes:  Complains of eye irritation; denies discharge. ENT:  Complains of hoarseness, nasal congestion, postnasal drainage, and sore throat. CV:  Denies chest pain or discomfort and palpitations. Resp:  Complains of cough; denies shortness of breath and wheezing. GI:  Denies diarrhea, nausea, and vomiting. Derm:  Denies itching and rash.  Physical Exam  General:  somewhat fatigued but well appearing  Head:  normocephalic, atraumatic, and no abnormalities observed.  tender frontal and maxillary sinuses - marked bilateral  Eyes:  vision grossly intact, pupils equal, pupils round, and pupils reactive to light.  slt conjunctival injection Ears:  R ear normal and L ear normal.   Nose:  nares are congested and injected  Mouth:  pharynx pink and moist, no erythema, and no exudates.  some post nasal drip  Neck:  No deformities, masses, or tenderness noted. Lungs:  Normal respiratory effort, chest expands symmetrically. Lungs are clear  to auscultation, no crackles or wheezes. Heart:  Normal rate and regular rhythm. S1 and S2 normal without gallop, murmur, click, rub or other extra sounds. Abdomen:  soft, non-tender, and normal bowel sounds.   Skin:  Intact without suspicious lesions or rashes Cervical Nodes:  No lymphadenopathy noted Psych:  normal affect, talkative and pleasant    Impression & Recommendations:  Problem # 1:  INFLUENZA (ICD-487.8) Assessment New almost resolved - but now with complicating sinus pain I believe to be  secondary sinus infection (see below) enc fluids and rest until energy level returns  Orders: Prescription Created Electronically [phone])  Problem # 2:  SINUSITIS - ACUTE-NOS (ICD-461.9) Assessment: New s/p influenza now with increasing sinus pain will cover with zithromax (avoided augmentin due to potential interaction with methotrexate)  fluids/rest  decongestant as needed  Her updated medication list for this problem includes:    Flonase 50 Mcg/act Susp (Fluticasone propionate) .Marland Kitchen... 2 sprays in each nostril once daily as needed    Astepro 0.15 % Soln (Azelastine hcl) ..... Use 2 spays two times a day as needed    Nasacort Aq 55 Mcg/act Aers (Triamcinolone acetonide(nasal)) ..... Once daily as needed    Zithromax 250 Mg Tabs (Azithromycin) .Marland Kitchen... Take by mouth as directed  Problem # 3:  ALLERGIC RHINITIS (ICD-477.9) Assessment: Unchanged under care of allergist- but needs refil of clarinex since claritin and zyrtec do not work  sent to pharmacy   Her updated medication list for this problem includes:    Flonase 50 Mcg/act Susp (Fluticasone propionate) .Marland Kitchen... 2 sprays in each nostril once daily as needed    Astepro 0.15 % Soln (Azelastine hcl) ..... Use 2 spays two times a day as needed    Nasacort Aq 55 Mcg/act Aers (Triamcinolone acetonide(nasal)) ..... Once daily as needed    Fexofenadine Hcl 180 Mg Tabs (Fexofenadine hcl) .Marland Kitchen... 1 by mouth once daily    Clarinex 5 Mg Tabs (Desloratadine) .Marland Kitchen... 1 by mouth once daily as needed allergies  Complete Medication List: 1)  Mylanta Tabs (Calcium & magnesium carbonates tabs) .... Take by mouth as directed 2)  Tylenol 325 Mg Tabs (Acetaminophen) .... Take by mouth as directed 3)  Alprazolam 0.25 Mg Tabs (Alprazolam) .... Take one by mouth three times a day prn 4)  Epipen Devi (Epinephrine hcl (anaphylaxis) devi) .... Use as directed prn 5)  Methotrexate 2.5 Mg Tabs (Methotrexate sodium) .... Take 9 tablets by mouth every week 6)  Nexium 40  Mg Cpdr (Esomeprazole magnesium) .Marland Kitchen.. 1 capsule twice a day 30 minutes before meals 7)  Folic Acid 1 Mg Tabs (Folic acid) .... Take 1 tablet by mouth once a day 8)  Flonase 50 Mcg/act Susp (Fluticasone propionate) .... 2 sprays in each nostril once daily as needed 9)  Proair Hfa 108 (90 Base) Mcg/act Aers (Albuterol sulfate) .... 2 puffs up to every 4 hours as needed wheezing 10)  Astepro 0.15 % Soln (Azelastine hcl) .... Use 2 spays two times a day as needed 11)  Nasacort Aq 55 Mcg/act Aers (Triamcinolone acetonide(nasal)) .... Once daily as needed 12)  Fexofenadine Hcl 180 Mg Tabs (Fexofenadine hcl) .Marland Kitchen.. 1 by mouth once daily 13)  Flexeril 10 Mg Tabs (Cyclobenzaprine hcl) .Marland Kitchen.. 1 by mouth at bedtime for neck and back pain as needed 14)  Zithromax 250 Mg Tabs (Azithromycin) .... Take by mouth as directed 15)  Clarinex 5 Mg Tabs (Desloratadine) .Marland Kitchen.. 1 by mouth once daily as needed allergies  Patient Instructions: 1)  I think you had the flu - continue getting lots of rest and fluids 2)  take the zithromax for sinus infection 3)  I sent that and clarinex to your pharmacy  4)  update me if not improving in a week- especially sinus pain  Prescriptions: CLARINEX 5 MG TABS (DESLORATADINE) 1 by mouth once daily as needed allergies  #30 x 11   Entered and Authorized by:   Judith Part MD   Signed by:   Judith Part MD on 12/10/2009   Method used:   [facility] to        [facility] (ret[facility]       [address]       [place], [place]  [address]       Ph: [phone] or [phone]       Fax: [phone]   RxID:   [id number] ZITHROMAX 250 MG TABS (AZITHROMYCIN) take by mouth as directed  #1 pack x 0   Entered and Authorized by:   Judith Part MD   Signed by:   Judith Part MD on 12/10/2009   Method used:   [facility]        [facility] [address] [facility]ail[facility]       [address]       [place], [place]  [address]       Ph: [phone] or  [phone]       Fax: [phone]   RxID:   [id number]   Current Allergies (reviewed today): ! CLARITIN ! ZYRTEC ALLERGY * YELLOW JACKET

## 2010-11-21 NOTE — Progress Notes (Signed)
Summary: complaining of upper abd pain  Phone Note Call from Patient   Caller: Patient Call For: Judith Part MD Summary of Call: Pt called complaining of bloating and discomfort in the middle of epigastric area, below her breasts.  She said she took a nexium but that didnt help.  She asked if she should call Dr Ardell Isaacs office, advised yes, since he is her GI. Initial call taken by: Lowella Petties CMA, AAMA,  October 31, 2010 3:05 PM

## 2010-11-21 NOTE — Assessment & Plan Note (Signed)
Summary: ABD PAIN   Vital Signs:  Patient profile:   69 year old female Height:      67 inches Weight:      195.25 pounds BMI:     30.69 Temp:     98.5 degrees F oral Pulse rate:   80 / minute Pulse rhythm:   regular BP sitting:   130 / 84  (left arm) Cuff size:   large  Vitals Entered By: Delilah Shan CMA  Dull) (November 05, 2010 11:31 AM) CC: abdominal pain   History of Present Illness: "the food just wants to lay right here [inferior to sternum]."  present for about 1.5 weeks.  No vomiting but some nausea, intermittent.  No blood in stool.  No fevers.  back on two times a day PPI for about 10days.  Off PPI prev for about 3 weeks.  "this feels like it did before."   Working on GERD diet and feels better after belching.  No NSAIDS.  On mylanta with some relief.  No hematemesis.   Allergies: 1)  ! Claritin 2)  ! Zyrtec Allergy 3)  * Yellow Jacket  Past History:  Past Medical History: Last updated: 06/15/2009 Allergic rhinitis Osteoarthritis- LS Nephrolithiasis, hx of RA gastritis  hyperlipidemia  former smoker  Hemorrhoids Hyperplastic Polyp 1998 GERD Fibula fracture (02/2005)  Past Surgical History: Last updated: 06/15/2009 Cholecystectomy Vaginal polyp excised - benign (12/2000) Lipoma removed (11/2002) SVT s/p surgery- ? ablation  ABD [place]- neg (08/1998) EP provedure- SVT, normal echo (1994) Cardiolite- pos (EKG only, scan neg. 01/2000) Cardiolite- neg EF 74% (07/2002) EGD- reactive gastropathy (08/2002) Cardiolite stress - neg EF 80% CT head- normal (01/2006) Dexa- o. k.  (08/2006) colonoscopy 08/2007 int hemorrhoids  Review of Systems       See HPI.  Otherwise negative.    Physical Exam  General:   no apparent distress normocephalic atraumatic mucous membranes moist, w/o erythema neck supple regular rate and rhythm clear to auscultation bilaterally abdominal exam soft, minimall tender to palpation in epigastrum.  no rebound, normal bs ext  w/o edema   Impression & Recommendations:  Problem # 1:  GERD (ICD-530.81) no change in meds and follow up with GI if not improved.  I would continue PPI.  I d/w patient about foods to avoid.  She understood. Nontoxic and no other localizing symptoms, ie dysuria.    Her updated medication list for this problem includes:    Mylanta Tabs (Calcium & magnesium carbonates tabs) .Marland Kitchen... Take by mouth as directed    Nexium 40 Mg Cpdr (Esomeprazole magnesium) .Marland Kitchen... 1 capsule twice a day 30 minutes before meals  Complete Medication List: 1)  Mylanta Tabs (Calcium & magnesium carbonates tabs) .... Take by mouth as directed 2)  Tylenol 325 Mg Tabs (Acetaminophen) .... Take by mouth as directed 3)  Alprazolam 0.25 Mg Tabs (Alprazolam) .... Take one by mouth three times a day prn 4)  Epipen Devi (Epinephrine hcl (anaphylaxis) devi) .... Use as directed prn 5)  Nexium 40 Mg Cpdr (Esomeprazole magnesium) .Marland Kitchen.. 1 capsule twice a day 30 minutes before meals 6)  Folic Acid 1 Mg Tabs (Folic acid) .... Take 1 tablet by mouth once a day 7)  Proair Hfa 108 (90 Base) Mcg/act Aers (Albuterol sulfate) .... 2 puffs up to every 4 hours as needed wheezing 8)  Astepro 0.15 % Soln (Azelastine hcl) .... Use 2 spays two times a day as needed 9)  Allegra 180 Mg Tabs (Fexofenadine hcl) .Marland Kitchen.. 1 by  mouth once daily as needed allergies 10)  Flexeril 10 Mg Tabs (Cyclobenzaprine hcl) .Marland Kitchen.. 1 by mouth at bedtime as needed neck and back pain  Patient Instructions: 1)  Keep taking the medicine as you are and let the GI clinic know next week if you aren't improving.  Take care.     Orders Added: 1)  Est. Patient Level III [[address]]    Current Allergies (reviewed today): ! CLARITIN ! ZYRTEC ALLERGY * YELLOW JACKET

## 2010-11-21 NOTE — Progress Notes (Signed)
Summary: Triage   Phone Note Call from Patient Call back at Home Phone [phone]   Caller: Patient Call For: Dr. Russella Dar Reason for Call: Talk to Nurse Summary of Call: Heartburn, having problems digesting, bloating Initial call taken by: Karna Christmas,  October 31, 2010 3:08 PM  Follow-up for Phone Call        Patient has not been taking Nexium due to lack of insurance. She just resumed it on Monday.  She is c/o reflux and epigastic pain.  I have asked her to increase the Nexium to two times a day for 1 week.  Patient instructed to maintain an anti-reflux diet. Advised to avoid caffeine, mint, citrus foods/juices, tomatoes,  chocolate, NSAIDS/ASA products.  Instructed not to eat within 2 hours of exercise or bed, multiple small meals are better than 3 large meals.  Need to take PPI 30 minutes prior to 1st meal of the day and at bedtime.  She is advised she can take OTC Pepcid if needed.  I have asked her to call back if her symptoms don't improve after 1 week.   Follow-up by: Darcey Nora RN, CGRN,  October 31, 2010 4:40 PM  Additional Follow-up for Phone Call Additional follow up Details #1::        Agree with above. Additional Follow-up by: Meryl Dare MD Clementeen Graham,  October 31, 2010 8:32 PM

## 2010-11-21 NOTE — Progress Notes (Signed)
Summary: cyclobenzprine  Phone Note Refill Request Message from:  Scriptline on October 29, 2010 12:25 PM  Refills Requested: Medication #1:  flexiril   Supply Requested: 1 month   Last Refilled: 10-2009   Notes: Not [facility] list [facility]   Method Requested: Telephone to Pharmacy Initial call taken by: Benny Lennert CMA Duncan Dull),  October 29, 2010 12:25 PM  Follow-up for Phone Call        she takes occ for neck and back pain px written on EMR for call in  Follow-up by: Judith Part MD,  October 29, 2010 1:52 PM  Additional Follow-up for Phone Call Additional follow up Details #1::        [facility]ication [facility] as instructed. Lewanda Rife LPN  October 29, 2010 5:49 PM     New/Updated Medications: FLEXERIL 10 MG TABS (CYCLOBENZAPRINE HCL) 1 by mouth at bedtime as needed neck and back pain Prescriptions: FLEXERIL 10 MG TABS (CYCLOBENZAPRINE HCL) 1 by mouth at bedtime as needed neck and back pain  #30 x 0   Entered and Authorized by:   Judith Part MD   Signed by:   Lewanda Rife LPN on 16/07/9603   Method used:   Telephoned to ...       [facility]       [address]       [place], [place]  [address]       Ph: [phone] or [phone]       Fax: [phone]   RxID:   [id number]

## 2011-01-13 ENCOUNTER — Other Ambulatory Visit: Payer: Self-pay | Admitting: *Deleted

## 2011-01-13 MED ORDER — ALPRAZOLAM 0.25 MG PO TABS: 0.2500 mg | ORAL_TABLET | Freq: Every day | ORAL | Status: DC | PRN

## 2011-01-13 NOTE — Telephone Encounter (Signed)
Spoke with pt and she states Dr Milinda Antis gave her rx a while back for 15-30 pills(pt cannot remember) I called [facility] and they have never filled for her. I called pt back to find out which pharmacy filled last and pt said it has been awhile since she had med and does not remember the pharmacy. Pt said if she has the alprazolam she takes it every day or every other day. When she is out of med like now she does with out. Sorry but I cannot get all info you requested. Pt understands it will be 01/14/11 Tues before she cks with [facility] about rx being called in.

## 2011-01-13 NOTE — Telephone Encounter (Signed)
I could not see in centricty when last refil was and from whom and # of pills Can you verify that and get back to me Also get a sense for how often she uses it ? -thanks

## 2011-01-13 NOTE — Telephone Encounter (Signed)
Will refill small amount - please adv her to take sparingly only if really needed because this is habit forming  Px done for call in

## 2011-01-14 ENCOUNTER — Telehealth: Payer: Self-pay

## 2011-01-14 MED ORDER — ALPRAZOLAM 0.25 MG PO TABS: 0.2500 mg | ORAL_TABLET | Freq: Every day | ORAL | Status: DC | PRN

## 2011-01-14 NOTE — Telephone Encounter (Signed)
Alprazolam 0.25mg  called in to [facility] 1 tablet by mouth daily as needed for anxiety.(take only if needed.

## 2011-01-14 NOTE — Telephone Encounter (Signed)
Ok thanks 

## 2011-01-14 NOTE — Telephone Encounter (Signed)
Medication phoned to [facility] pharmacy as instructed.Patient notified as instructed by telephone.   

## 2011-01-15 ENCOUNTER — Other Ambulatory Visit: Payer: Self-pay | Admitting: Gastroenterology

## 2011-01-15 NOTE — Telephone Encounter (Signed)
Please see 01/13/11 refill.

## 2011-03-02 ENCOUNTER — Other Ambulatory Visit: Payer: Self-pay | Admitting: Gastroenterology

## 2011-03-03 ENCOUNTER — Other Ambulatory Visit: Payer: Self-pay | Admitting: Gastroenterology

## 2011-03-03 NOTE — Telephone Encounter (Signed)
NEEDS OFFICE VISIT FOR ANY FURTHER REFILLS! 

## 2011-03-03 NOTE — Telephone Encounter (Signed)
Informed pt that I sent a refill to her pharmacy this morning for her Nexium. Pt verbalized understanding.

## 2011-03-04 NOTE — Assessment & Plan Note (Signed)
[facility]                         GASTROENTEROLOGY OFFICE NOTE   NAME:[name], [name]                     MRN:          [id number]  DATE:02/24/2008                            DOB:          15-Jan-1941    HISTORY:  [name] complains of soreness and swelling on her left  lower ribs as well as worsening reflux symptoms.  She has no dysphagia,  odynophagia, change in bowel habits, weight loss, melena, hematochezia.   CURRENT MEDICATIONS:  Listed on the chart, updated and reviewed.   MEDICATION ALLERGIES:  NONE KNOWN.   PHYSICAL EXAMINATION:  GENERAL:  No acute distress.  VITAL SIGNS:  Weight 194 pounds, blood pressure 140/76, pulse 72 and  regular.  CHEST:  Clear to auscultation bilaterally.  She has tenderness at her  left costal margin and the left lower ribs anteriorly and laterally.  CARDIAC:  Regular rate and rhythm without murmurs.  ABDOMEN:  Soft, nontender with normoactive bowel sounds.  No palpable  organomegaly, masses or hernias.   ASSESSMENT/PLAN:  1. Left lower chest pain which appears to be musculoskeletal.  Follow      up with Dr. Milinda Antis.  2. Gastroesophageal reflux disease with good, but not optimal, symptom      control.  Re-intensify all antireflux measures.  Increase Nexium to      40 mg p.o. b.i.d.  Return office visit in 4-6 weeks.     Amy Lick. Russella Dar, MD, [facility]  Electronically Signed    MTS/MedQ  DD: 02/25/2008  DT: 02/25/2008  Job #: [id number]   cc:   Marne A. Milinda Antis, MD

## 2011-03-07 NOTE — H&P (Signed)
NAME:  [name], [name]                        ACCOUNT NO.:  [id number]   MEDICAL RECORD NO.:  [id number]                   PATIENT TYPE:  INP   LOCATION:  0372                                 FACILITY:  [facility]   PHYSICIAN:  Lonzo Cloud. Kriste Basque, M.D. [facility]            DATE OF BIRTH:  Dec 07, 1940   DATE OF ADMISSION:  07/22/2002  DATE OF DISCHARGE:                                HISTORY & PHYSICAL   HISTORY:  The patient is a 70 year old black female, patient of Dr. Royden Purl,  followed for general medical purposes.  She states she was in her usual  state of health until two weeks ago when she underwent gum surgery by Dr.  Orvan Falconer.  Ever since that time, she has had chest pain and a number of  symptoms.  She described the pain as being sharp, radiating across her  chest, down to her abdomen, and up into her neck.  This is associated with  shortness of breath and nausea but no vomiting.  She had been feeling weak  in addition.  She notes some shaking chills but denies any fever or sweats.  She has apparently seen Dr. Milinda Antis on three occasions in the last two weeks  and was started on Protonix.  She went to the Emergency Room at [facility] about a week ago and was diagnosed with a Klebsiella urinary tract  infection and started on Septra.   She has a questionable history of cardiac problems.  She notes a history of  an irregular rapid heartbeat and workup at [facility] in the past.  She has what sounds like a cardiac catheterization and was told that she had  dead cells.  But was not given any therapy other than Tylenol.  She  believes she may have seen the Cardiologist at [facility] with what  sounds like a stress test but she does not know the results (about two years  ago).  She states she was previously on blood pressure medications but has  not required any of this and was not given any heart medications from the  cardiologist.  The patient was seen in the emergency room by  Dr. Josie Saunders of  the Emergency Room staff.  She obtained EKG and labs, which were normal.  She did chest x-ray and a CT of the chest which is pending.  Dr. Josie Saunders felt  that since this was her fifth visit since the gum surgery still complaining  of the same symptoms, she needed to be admitted to rule out an MI and for  further cardiac testing.   PAST MEDICAL HISTORY:  In addition to the above, the patient has a history  of kidney stones in the past.  She has also had history of colon polyps with  the last colonoscopy about five years ago.  Her gynecologist is Dr. Roslyn Smiling  and she does not know of any problems.  There is  a family history of colon  cancer and breast cancer.   CURRENT MEDICATIONS:  1. Protonix 40 mg p.o. q.d.  2. Mylanta 30 cc p.o. p.r.n.  3. She is not currently taking aspirin.  4. She states she cannot take any medication greater than 325 mg in dose.   FAMILY HISTORY:  The patient's father died at age 59 of colon cancer.  The  patient's mother died at age 67 with history of diabetes and she was on  dialysis.  She also had a pacemaker.  The patient had six siblings.  One  sister who died from breast cancer and five brothers; one brother has  diabetes, the other four alive and well as far as she knows.   SOCIAL HISTORY:  The patient is married, her husband's name is Billey Gosling, and  they have been married 37 years.  His phone number is [phone].  The couple  has three children who are in good general health.  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  She has been a smoker starting in her 64s up to  present but states she quit for 10 years in between.  She never smoked more  than a pack a day.  This adds up to about a 30-year history of half a pack a  day smoking.  She does not know her serum cholesterol but is not on cardiac  medication or any specific diet.   REVIEW OF SYSTEMS:  HEENT:  The patient notes some mild dull headaches, and  a slight sore throat  recently.  GI:  She has had some abdominal discomfort  since her gum surgery and nausea, and was started on Protonix.  GU:  She had  a urinary tract infection with Klebsiella treated with Septra.   PHYSICAL EXAMINATION:  GENERAL:  The patient is a 70 year old black female  in no acute distress.  VITAL SIGNS:  Blood pressure 128/82, pulse 68 per minute and regular,  respirations 20 per minute and not labored, temperature 98 degrees orally.  HEENT:  Unremarkable.  She wears glasses.  NECK:  No jugular venous distention, no carotid bruits, no thyromegaly, or  lymphadenopathy.  CHEST:  Clear to percussion and auscultation.  CARDIAC:  Regular rhythm, grade 1/6 systolic ejection murmur left sternal  border.  No rubs or gallops heard.  There is some tenderness of the chest  wall on palpation across the precordium.  ABDOMEN:  Epigastric tenderness on palpation.  Normal bowel sounds.  No  evidence of organomegaly or masses.  EXTREMITIES:  No cyanosis, clubbing or edema.  NEUROLOGIC:  Exam is intact without focal abnormalities detected.   RADIOLOGY:  Chest x-ray is within normal limits.   EKG:  Shows some minor nonspecific ST-T wave changes.   CT OF THE CHEST:  To rule out pulmonary emboli per Dr. Josie Saunders, Emergency  Room, is pending at this time.   LABORATORY DATA:  The patient's laboratory data including CBC, complete  metabolic panel, CPK, and troponin are normal.   IMPRESSION:  Atypical chest pain over the last two weeks since she has had  gum surgery.  As this is her fifth visit and symptoms continue, I feel we  need to rule out cardiac process, get a 2-D echocardiogram and Cardiolite.  Unfortunately, this is Friday evening and we may not be able to get these  studies before Monday.  We will admit her to rule out a myocardial  infarction and to get the appropriate studies.  Will treat her discomfort with continued Protonix, Darvocet as needed for pain, and some alprazolam.                                                Lonzo Cloud. Kriste Basque, M.D. [facility]    SMN/MEDQ  D:  07/22/2002  T:  07/23/2002  Job:  [id number]   cc:   Marne A. Milinda Antis, M.D. [facility]

## 2011-03-07 NOTE — Discharge Summary (Signed)
NAME:  [name], [name]                        ACCOUNT NO.:  [id number]   MEDICAL RECORD NO.:  [id number]                   PATIENT TYPE:  INP   LOCATION:  0372                                 FACILITY:  [facility]   PHYSICIAN:  Rosalyn Gess. Norins, M.D. [facility]         DATE OF BIRTH:  1941-07-17   DATE OF ADMISSION:  07/22/2002  DATE OF DISCHARGE:  07/24/2002                                 DISCHARGE SUMMARY   ADMISSION DIAGNOSES:  1. Chest pain, rule out myocardial infarction.  2. Gastroesophageal reflux disease.  3. Rule out infection.   DISCHARGE DIAGNOSES:  1. Ruled out for myocardial infarction.  2. Gastroesophageal reflux disease.  3. No evidence of infection.  4. Costochondritis.   CONSULTATIONS:  None.   PROCEDURE:  1. CT scan of the chest with contrast to rule out pulmonary embolism.     Negative study except for mild changes of chronic obstructive pulmonary     disease with no other evidence of abnormality, no evidence of  pulmonary     embolism, lower extremity study no evidence of deep vein thrombosis.  2. Portable chest x-ray on 07/22/02, with chronic obstructive pulmonary     disease and no active disease.   HISTORY OF PRESENT ILLNESS:  This is a 70 year old black female followed by  Dr. Roxy Manns who had a three week illness since having gum surgery where  she was complaining of chest pain and multiple other symptoms.  She  describes the pain as radiating across her chest and down her abdomen up her  neck.  Pain is worse with deep inspiration or movement.  The patient has  complained of weakness.  She has complained of shaking chills.  She had seen  Dr. Milinda Antis on three occasions in the last two weeks, and had been started on  Protonix.  She had been seen in the emergency department at [facility],  and was found to have a Klebsiella urinary tract infection that was treated  with Septra.   The patient has a concern about heart disease.  She had been worked up at  [facility] in the past.  She had been seen in the emergency department  as noted, and it was felt at this time that she needed to be admitted for  rule out.   Please see the H&P for past medical history, family history, and social  history.   HOSPITAL COURSE:  #1 -  CARDIOVASCULAR:  The patient was admitted to a  telemetry floor.  She maintained a normal sinus rhythm with no  abnormalities.  A 12-lead electrocardiogram revealed normal sinus rhythm  with no abnormalities noted.  The patient's cardiac enzymes were negative  x2.  Troponin-I was negative x2.  With no evidence of coronary artery  disease based on cardiac enzymes and normal EKG, it is felt the patient is  stable for discharge home.  Plan is that the patient is to have  an  outpatient Cardiolite study at [facility].  I do not feel a 2-D  echocardiogram is warranted at this time.  #2 -  GASTROINTESTINAL:  The patient with symptoms that are consistent with  reflux disease.  She continued to have kind of a substernal discomfort  described as a burning tight pain and shortness of breath.  During hospital  stay, the patient's Protonix was increased to 40 mg b.i.d.  The plan is that  the patient is discharged home on this regimen.  She is already scheduled  for a GI consultation and esophagogastroduodenoscopy and colonoscopy on  08/03/02.  #3 -  INFECTIOUS DISEASE:  The patient complained of chills.  She did have  an urinary tract infection.  It was successfully treated with normal  followup urinalysis.  Her CBC's have been normal with no elevation in her  white count.  She has had normal chest x-ray, including normal CT scan.  She  has had no fevers, and no evidence of overt infection.  The plan is no  further evaluation at this time.  #4 -  CHEST WALL PAIN:  On examination, the patient does have significant  tenderness, particularly at the costal sternal junction.  The patient was  given Toradol IV with some relief  of her discomfort.  Strongly suspect the  patient has an element of costochondritis as part of her pain and  discomfort.  The plan is that the patient is to be discharged home on Vioxx  50 mg p.o. q.d.   I discussed all of these findings with the patient in great detail.  I did  my best to reassure her that to the best of our ability at this time we have  uncovered no significant underlying major medical problem.  I urged her to  consider discharge home with followup as noted above for various problems.  She is amenable at this time.   DISCHARGE PHYSICAL EXAMINATION:  VITAL SIGNS:  The patient is afebrile, and  in fact, has had no temperature elevation during her hospital stay.  Blood  pressure 132/71, pulse 62, respirations 16, O2 saturation 96%.  GENERAL:  This is a well-developed, well-nourished black female in bed in no  acute distress.  CHEST:  The patient is moving air well with no rales or wheezes.  The  patient is tenderness to palpation along the chest wall, particularly at the  costal sternal junction.  CARDIOVASCULAR:  Radial pulse 2+, the precordium is quiet, regular rate and  rhythm.  ABDOMEN:  Positive bowel sounds.  No further examination conducted.   LABORATORY DATA:  CBC on 07/22/02, with a white count of 8000, 62% segs, 32%  lymphs, 4% monos.  Hemoglobin 13.2, hematocrit 38.8, platelet count 309,000.  Chemistries on 07/23/02, with sodium of 137, potassium 3.5, chloride 104, CO2  29, glucose 89, BUN 8, creatinine 0.9.  Liver function tests were normal.  CK's were negative as noted at 63 and 98, troponin-I was negative at 0.01  and 0.01, respectively.  Cholesterol panel with a cholesterol of 137, HDL  41, LDL 65, triglycerides 157.  Thyroid function normal with a TSH of 2.162.  Urinalysis was negative on 07/23/02.  Radiographic studies as outlined above.   DISCHARGE MEDICATIONS:  1. Protonix 40 mg b.i.d. 2. Xanax 0.25 mg t.i.d. p.r.n.  3. Liquid antacid of choice.  4.  Laxative of choice.  5. Vioxx 50 mg p.o. q.d.    DISPOSITION:  The patient is discharged home.  She will have outpatient  Cardiolite study scheduled.  She will keep her appointment for outpatient GI  evaluation.   CONDITION ON DISCHARGE:  Stable.                                               Rosalyn Gess Norins, M.D. [facility]    MEN/MEDQ  D:  07/24/2002  T:  07/24/2002  Job:  [id number]   cc:   Marne A. Tower, M.D. [facility]   [facility]  [facility]   GI Department of [facility]
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
## 2011-03-07 NOTE — Op Note (Signed)
   NAME:  [name], [name]                        ACCOUNT NO.:  [id number]   MEDICAL RECORD NO.:  [id number]                   PATIENT TYPE:  AMB   LOCATION:  DAY                                  FACILITY:  [facility]   PHYSICIAN:  Timothy E. Earlene Plater, M.D.              DATE OF BIRTH:  [birth year]   DATE OF PROCEDURE:  12/02/2002  DATE OF DISCHARGE:                                 OPERATIVE REPORT   PREOPERATIVE DIAGNOSIS:  Lipoma, left posterior shoulder.   POSTOPERATIVE DIAGNOSIS:  Lipoma, left posterior shoulder.   PROCEDURE:  Excision of lipoma.   SURGEON:  Timothy E. Earlene Plater, M.D.   ANESTHESIA:  Local standby.   CLINICAL NOTE:  The patient is 49, otherwise healthy.  Has an enlarging  lipoma, left posterior shoulder, that has become painful and after careful  discussion, she wishes to have a repair.  This was identified and marked,  permit signed, evaluated by anesthesia.   DESCRIPTION OF PROCEDURE:  The patient was taken to the operating room,  placed supine, IV started, and mild sedation was given.  She was turned  toward the right with the left shoulder fully exposed.  It was prepped and  draped in the usual fashion.  Marcaine 0.25% was used throughout for local  anesthesia.  A curvilinear incision made over this approximate 10 cm lipoma,  the subcutaneous tissue dissected, the lipomatous cavity entered, and the  incision extended somewhat.  The lipoma was multilobulated, was not  encapsulated, and was distally adherent to the surrounding tissue, including  the fascia of the muscles.  Careful dissection around and about this area  finally revealed a distinctive anatomic end to the lipoma laterally.  This  was then dissected medially both superior and inferior to its medial border.  This was carried out with care and detail so that all of the lipomatous  structure was removed.  Bleeding was controlled with a cautery, and the  wound was dry.  The specimen measured 13 x 6 x 2.  With  the cavity dry, I  did place a 7 mm drain, brought it out through the inferior stab wound, and  tied it to the skin.  The skin was then closed with wide skin staples.  A  fluffy compression dressing applied.  She tolerated it well, counts correct,  and she was removed to the recovery room.   Written and verbal instructions, including Percocet 5 mg #36 were given, and  she will be followed in the office in five days.                                               Timothy E. Earlene Plater, M.D.    TED/MEDQ  D:  12/02/2002  T:  12/02/2002  Job:  [id number]

## 2011-03-07 NOTE — Op Note (Signed)
NAME:  [name], [name]                        ACCOUNT NO.:  [id number]   MEDICAL RECORD NO.:  [id number]                   PATIENT TYPE:  OIB   LOCATION:  NA                                   FACILITY:  [facility]   PHYSICIAN:  Donalee Citrin, M.D.                     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   PROCEDURE:  Anterior cervical diskectomy and fusion at C5-6 and C6-7 using a  6-mm patellar wedge at C5-6 and  7-mm at C6-7 and a 40-mm Atlantis plate with six 54-UJ variable angle  screws.   SURGEON:  Donalee Citrin, M.D.   ASSISTANT:  Tia Alert, M.D.   ANESTHESIA:  General anesthesia.   HISTORY OF PRESENT ILLNESS:  The patient is a 70 year old female who has had  longstanding neck and bilateral arm pain, worse on the right, radiating to  her shoulder, down to her hand, with numbness and tingling in the first 3  fingers of her right hand.  The patient also had developed weakness in the  triceps.  Preoperative imaging showed severe spondylitic disease with  spondylitic with spondylitic compression of the C6 and C7 nerve roots on the  right.  The patient was recommended anterior cervical diskectomy and fusion.  I subsequently went over the risks and benefits of the surgery with her.  She understands and agrees to proceed.   DESCRIPTION OF PROCEDURE:  Patient was brought to the OR where she received  general anesthesia and she was positioned supine, neck positioned in slight  extension with 5 pounds of Holter traction.  The right side of her neck was  prepped and draped in the usual sterile fashion.  Preop x-ray localized the  C6 vertebral body.  A curvilinear incision was made just off the midline to  the anterior portion of sternocleidomastoid.  The superficial layer of the  platysma was dissected out and divided longitudinally.  The avascular plane  between the sternocleidomastoid and the strap muscles was developed down to  the prevertebral fascia which was dissected with Kittners.  Intraoperative x-  ray confirmed the localization of the C5-6 disk space.  Disk space was  marked with annulotomy made in the C5-6 disk space.  Longus colli were then  reflected laterally and self-retaining retractor was placed.  A large  anterior osteophytic complex was bitten off the C6-7 interspace.  Then the  annulotomies were extended.  A high-speed drill was used to drill out both  disk spaces down the posterior osteophytic complex and posterior annulus.  Then the operating microscope was draped, brought into the field __________  the C5-6 disk space and then the disk space was drilled down again to the  posterior annulus and longitudinal ligament.  Then using a 1-mm Kerrison  punch, a large osteophytic complex coming off the C6 vertebral body,  extending leftward, was underbitten, exposing the thecal sac and the  proximal aspect of the left C6 nerve root and then this repeated with  decompression on the right side.  There seemed to be a partially small soft  disk compressing the thecal sac and right C6 nerve root.  This was all  removed and large osteophytic spur coming off the uncinate process was also  removed from the C6 neural foramen on the right.  At the end of the  diskectomy, there was no further stenosis on central canal or either C6  nerve roots.  Gelfoam was placed in this interspace.  Then attention was  taken to the C6-7 interspace and the procedure was repeated with a high-  speed drill, drilling down to the anterior cruciate and posterior  longitudinal ligament, which was underbitten with an 11-mm Kerrison punch,  as well as a very large osteopenic complex of the C7 vertebral body and the  uncinate process compressing the C7 nerve root.  In addition, there was  also  a very large partially calcified posterior longitudinal ligament and disk  complex right behind the C6 vertebral body that was densely adherent to the  dura.  This was teased away with a black nerve hook and fished out from  underneath and behind the C6 vertebral body and using a combination of  upgoing curettes, the black nerve hook and a 1 Kerrison, this was all  removed in piecemeal fashion.  Then both C7 nerve roots were widely  decompressed with the neural foramen explored with an angled nerve hook and  noted to have no further stenosis.  Then the end plate was scraped and  prepared to receive bone graft.  Two patellar wedges of 6 and 7 mm,  respectively, were fashioned with 2 mm cut off the depth and inserted at C5-  6 and C6-7 respectively.  Approximately 1 mm deep to the anterior vertebral  body line,  fluoroscopy confirmed good position of plates, screws and bone  graft.  Then a 40-mm Atlantis plate was sized, selected and six 13-mm  variable angle screws were drilled, tapped in place; all screws had  excellent purchase.  Wound was then copiously irrigated and meticulous  hemostasis was maintained.  The platysma was reapproximated with 3-0  interrupted Vicryl and the skin was closed with a running 4-0 subcuticular.  Benzoin and Steri-Strips were applied.  The patient went to recovery room in  stable condition.  At the end of the case, needle count and sponge count  were correct.                                               Donalee Citrin, M.D.    GC/MEDQ  D:  04/19/2004  T:  04/20/2004  Job:  [id number]

## 2011-04-01 ENCOUNTER — Telehealth: Payer: Self-pay | Admitting: Gastroenterology

## 2011-04-01 NOTE — Telephone Encounter (Signed)
Patient c/o abdominal pain.  She is not sure exactly where she is feeling the pain she thinks it is all over her abdomen. She is on nexium BID and this is not helping her symptoms I have offered her an appt for tomorrow with Mike Gip PA , but she is unable to come due to transportation.  I have scheduled her to come in on 04/04/11.

## 2011-04-04 ENCOUNTER — Other Ambulatory Visit (INDEPENDENT_AMBULATORY_CARE_PROVIDER_SITE_OTHER): Payer: Medicare Other

## 2011-04-04 ENCOUNTER — Encounter: Payer: Self-pay | Admitting: Physician Assistant

## 2011-04-04 ENCOUNTER — Ambulatory Visit (INDEPENDENT_AMBULATORY_CARE_PROVIDER_SITE_OTHER): Payer: Medicare Other | Admitting: Physician Assistant

## 2011-04-04 VITALS — BP 124/74 | HR 74 | Ht 67.0 in | Wt 197.6 lb

## 2011-04-04 DIAGNOSIS — R1084 Generalized abdominal pain: Secondary | ICD-10-CM

## 2011-04-04 DIAGNOSIS — K219 Gastro-esophageal reflux disease without esophagitis: Secondary | ICD-10-CM

## 2011-04-04 LAB — CBC WITH DIFFERENTIAL/PLATELET
Basophils Relative: 0.5 % (ref 0.0–3.0)
Eosinophils Absolute: 0.1 10*3/uL (ref 0.0–0.7)
Eosinophils Relative: 1.8 % (ref 0.0–5.0)
HCT: 40.6 % (ref 36.0–46.0)
Lymphs Abs: 2.4 10*3/uL (ref 0.7–4.0)
MCHC: 34.2 g/dL (ref 30.0–36.0)
MCV: 90.2 fl (ref 78.0–100.0)
Monocytes Absolute: 0.5 10*3/uL (ref 0.1–1.0)
Neutrophils Relative %: 53.3 % (ref 43.0–77.0)
Platelets: 263 10*3/uL (ref 150.0–400.0)
WBC: 6.6 10*3/uL (ref 4.5–10.5)

## 2011-04-04 LAB — BASIC METABOLIC PANEL
BUN: 8 mg/dL (ref 6–23)
Calcium: 8.9 mg/dL (ref 8.4–10.5)
GFR: 97.06 mL/min (ref >60.00)
Glucose, Bld: 77 mg/dL (ref 70–99)
Potassium: 4.2 mEq/L (ref 3.5–5.1)
Sodium: 138 mEq/L (ref 135–145)

## 2011-04-04 MED ORDER — DICYCLOMINE HCL 10 MG PO CAPS: 10.0000 mg | ORAL_CAPSULE | Freq: Two times a day (BID) | ORAL | Status: DC | PRN

## 2011-04-04 MED ORDER — ALIGN 4 MG PO CAPS: 4.0000 mg | ORAL_CAPSULE | Freq: Two times a day (BID) | ORAL | Status: DC

## 2011-04-04 NOTE — Patient Instructions (Signed)
We have given you samples of a Probiotic- Align capsules, take 1 daily for 14 days. We sent a prescription for dicyclomine 10 mg, take as directed to your pharmacy, [facility] [place], [place]. Called [place] on Tues 04-08-2011 and ask for Lavonna Rua /nurse, Dr Anselm Jungling nurse.

## 2011-04-06 ENCOUNTER — Encounter: Payer: Self-pay | Admitting: Physician Assistant

## 2011-04-06 NOTE — Progress Notes (Signed)
  Subjective:    Patient ID: [name], female    DOB: [birth year], 70 y.o.   MRN: [id number]  HPI [name] is a pleasant 56 H. or old female known to Dr. Damita Lack with history of rheumatoid arthritis. She has history of chronic GERD, colon polyps and family history of colon cancer. EGD December 2010 was normal Colonoscopy November 2008 negative except for internal hemorrhoids, due for followup 2013.  [name] comes in today after onset of an acute illness about 6 days ago. She complains of generalized abdominal discomfort bloating and gas. At onset s he had some mild diarrhea which has improved. She has not had any vomiting but has had some vague nausea, no fever no chills, no sick contacts. She has not started any new meds and is not using any NSAIDs no recent antibiotics. She complains of abdominal pain and cramping after eating, which again has been better over the past couple of days. She did not note any melena or hematochezia. She is on Nexium 40 mg twice daily, for maintenance. She is planning to do a trip to [place] within a week, and wanted to be sure she was better.    Review of Systems  Constitutional: Negative.   HENT: Negative.   Eyes: Negative.   Respiratory: Negative.   Cardiovascular: Negative.   Gastrointestinal: Positive for nausea, abdominal pain and diarrhea.  Genitourinary: Negative.   Musculoskeletal: Negative.   Skin: Negative.   Neurological: Negative.   Hematological: Negative.   Psychiatric/Behavioral: Negative.        Objective:   Physical Exam Well-developed female in no acute distress , pleasant, alert and oriented X3 HEENT; nontraumatic normocephalic EOMI PERRLA sclera anicteric Neck; Supple no JVD  Cardiovascular; regular rate and rhythm with S1-S2 no murmur rub or gallop  Pulmonary; clear bilaterally Abdomen ; soft mildly tender bilaterally in the lower quadrants no guarding no rebound no palpable masses or hepatosplenomegaly bowel sounds active  Rectal;  Hemoccult-negative  Psych; mood and affect normal and appropriate.        Assessment & Plan:  #58 70 year old female with 5-6 day history of generalized abdominal discomfort bloating gas mild diarrhea and nausea all of which are improving without any specific therapy. Suspect viral gastroenteritis; resolving.  Plan; Continue Nexium 40 mg twice daily Start Align  one by mouth daily x2-3 weeks, samples given today Add Bentyl 10 mg 2-3 times daily as needed for abdominal discomfort, cramping Check CBC and b met today Followup with Dr. Russella Dar as needed  #2 Chronic GERD  #3 Personal history of colon polyps and family history of colon cancer patient's father  Due for followup colonoscopy November 2013

## 2011-04-07 NOTE — Progress Notes (Signed)
Agree with assessment and plan as per Ms. Esterwood. Maveryk Renstrom E. Byrne Capek, MD, FACG  

## 2011-04-10 ENCOUNTER — Telehealth: Payer: Self-pay | Admitting: *Deleted

## 2011-04-10 NOTE — Telephone Encounter (Signed)
Per Willette Cluster, NP, call patient with CBC and Bmet results are normal. Patient notified of results.

## 2011-04-18 ENCOUNTER — Encounter: Payer: Self-pay | Admitting: Neurosurgery

## 2011-04-18 ENCOUNTER — Other Ambulatory Visit: Payer: Self-pay | Admitting: Gastroenterology

## 2011-04-18 NOTE — Telephone Encounter (Signed)
error 

## 2011-06-05 ENCOUNTER — Telehealth: Payer: Self-pay | Admitting: Gastroenterology

## 2011-06-05 MED ORDER — DICYCLOMINE HCL 10 MG PO CAPS: 10.0000 mg | ORAL_CAPSULE | Freq: Two times a day (BID) | ORAL | Status: DC | PRN

## 2011-06-05 NOTE — Telephone Encounter (Signed)
Prescription sent to the pharmacy.

## 2011-06-12 ENCOUNTER — Ambulatory Visit (INDEPENDENT_AMBULATORY_CARE_PROVIDER_SITE_OTHER): Payer: Medicare Other | Admitting: Family Medicine

## 2011-06-12 ENCOUNTER — Encounter: Payer: Self-pay | Admitting: Family Medicine

## 2011-06-12 VITALS — BP 140/80 | HR 68 | Temp 97.7°F | Wt 194.2 lb

## 2011-06-12 DIAGNOSIS — R109 Unspecified abdominal pain: Secondary | ICD-10-CM

## 2011-06-12 LAB — POCT URINALYSIS DIPSTICK
Ketones, UA: NEGATIVE
Protein, UA: NEGATIVE
Spec Grav, UA: 1.015
Urobilinogen, UA: 0.2
pH, UA: 7

## 2011-06-12 MED ORDER — ACYCLOVIR 800 MG PO TABS: 800.0000 mg | ORAL_TABLET | Freq: Four times a day (QID) | ORAL | Status: AC

## 2011-06-12 NOTE — Patient Instructions (Signed)
Urine looking ok. This is either early shingles or muscle strain of left back. Try flexeril to see if will help with strain and help you sleep at night ( may make you sleepy). Keep eye on left back skin - I have provided you with script for acyclovir in case rash further develops into shingles rash (normal shingles rash is very tender small red blisters that are either alone or in clusters on one side of body).

## 2011-06-12 NOTE — Assessment & Plan Note (Signed)
UA with trace blood and LE, but micro WNL.  Doubt nephrolithiasis or urinary tract infection. Anticipate muscle strain given some positional however did have small rash, possible early shingles vs nonspecific eruption.  Treat wit flexeril she has at home, provided with acyclovir script in case rash develops into shingles rash.

## 2011-06-12 NOTE — Progress Notes (Signed)
  Subjective:    Patient ID: [name], female    DOB: 05/23/1941, 70 y.o.   MRN: [id number]  HPI CC: back pain  4d h/o left sided back pain under ribcage.  Denies injury/trauma.  Worse with twisting motion.  Did have some abd pain and nausea, resolved since.  Has tried tylenol which did help some as well as heating pad - which helped some as well.  Hasn't tried flexeril.  Seems to be easing off.  pain described as mid spine to left lateral chest wall, achey sometimes stabbing.  No fevers/chills, v/d, dysuria, urgency or frequency. No h/o shingles, has had chicken pox.  Never had zostavax. + h/o kidney stones, this doesn't feel like that. H/o RA on MTX.  Review of Systems Per HPI    Objective:   Physical Exam  Nursing note and vitals reviewed. Constitutional: She appears well-developed and well-nourished. No distress.  HENT:  Head: Normocephalic and atraumatic.  Mouth/Throat: Oropharynx is clear and moist. No oropharyngeal exudate.  Eyes: Conjunctivae and EOM are normal. Pupils are equal, round, and reactive to light.  Cardiovascular: Normal rate, regular rhythm, normal heart sounds and intact distal pulses.   No murmur heard. Pulmonary/Chest: Effort normal and breath sounds normal. No respiratory distress. She has no wheezes. She has no rales.  Abdominal: Soft. Bowel sounds are normal. She exhibits no distension. There is tenderness in the suprapubic area. There is CVA tenderness (left). There is no rebound and no guarding.  Musculoskeletal: She exhibits no edema.  Skin: Skin is warm and dry. Rash noted.          Isolated erythematous papules left ~T9 distribution  Psychiatric: She has a normal mood and affect.          Assessment & Plan:

## 2011-06-13 ENCOUNTER — Telehealth: Payer: Self-pay

## 2011-06-13 NOTE — Telephone Encounter (Signed)
Pt saw Dr Sharen Hones on 06/12/11. Pt wants to know if Dr Milinda Antis thinks Acyclovir 800 mg four times daily is too high a dose and too often to take med. I asked pt if she was having problem with med and she said no that she had only taken one pill today. She wants Dr Royden Purl opinion about taking med. Pt said she does not think the rash is any worse today. Please advise. Pt can be reached [phone].

## 2011-06-13 NOTE — Telephone Encounter (Signed)
If she has a rash resembling shingles that is absolutely the dose to take and less than that would not be effective - I know it sounds like a lot but that is the px dose for shingles

## 2011-06-13 NOTE — Telephone Encounter (Signed)
Patient notified as instructed by telephone. 

## 2011-07-01 ENCOUNTER — Telehealth: Payer: Self-pay | Admitting: *Deleted

## 2011-07-01 NOTE — Telephone Encounter (Signed)
Pt finished medicine for shingles but she is still having a lot of itching on arms, fingers, arms and back.  She is asking if she should have another round of medicine.  Uses [facility].

## 2011-07-01 NOTE — Telephone Encounter (Signed)
Patient notified as instructed by telephone. Pt will give more time and will call back for appt if needed.

## 2011-07-01 NOTE — Telephone Encounter (Signed)
No - more med for that does not help- just the initial course (but that was a good question) If she is developing chronic pain from this please f/u Otherwise - let her know it can take quite a while for things to die down

## 2011-07-09 ENCOUNTER — Ambulatory Visit (INDEPENDENT_AMBULATORY_CARE_PROVIDER_SITE_OTHER): Payer: Medicare Other | Admitting: Family Medicine

## 2011-07-09 ENCOUNTER — Encounter: Payer: Self-pay | Admitting: Family Medicine

## 2011-07-09 DIAGNOSIS — M545 Low back pain: Secondary | ICD-10-CM

## 2011-07-09 DIAGNOSIS — B0229 Other postherpetic nervous system involvement: Secondary | ICD-10-CM

## 2011-07-09 DIAGNOSIS — R030 Elevated blood-pressure reading, without diagnosis of hypertension: Secondary | ICD-10-CM

## 2011-07-09 MED ORDER — GABAPENTIN 300 MG PO CAPS: 300.0000 mg | ORAL_CAPSULE | Freq: Two times a day (BID) | ORAL | Status: DC | PRN

## 2011-07-09 NOTE — Patient Instructions (Signed)
Try the gabapentin for shingles pain  It will sedate you - but usually you get used to it pretty quickly  If back pain worsens tell your rheumatologist (they may want to do some x rays)  Blood pressure is high today -- do not take any DECONGESTANTS over the counter  Schedule nurse visit for bp check in 2 weeks Avoid salt in diet

## 2011-07-09 NOTE — Progress Notes (Signed)
Subjective:    Patient ID: [name], female    DOB: 1941/04/09, 70 y.o.   MRN: [id number]  HPI Here for f/u of shingles Seen by Dr Reece Agar on 8/23 and had pain with small rash in L T9 distribution  tx with acyclovir- finished the course  She is on methotrexate - still on that - did not stop it during the shingles   Both legs are burning some - both sides - front of her legs  Also low back is bothering her  No numbness- just burning and hurting  Most pain is still over area where her rash was -- chest wall and back  At times tolerable and at times more severe and keeps her from sleeping   Took allergy med this am - allegra (? If D ) - from Dr Corinda Gubler  bp is high today - unusual  No headache No cp or edema or palpitations   Patient Active Problem List  Diagnoses  . HYPERCHOLESTEROLEMIA  . TOBACCO ABUSE  . PSVT  . SINUSITIS - ACUTE-NOS  . ALLERGIC RHINITIS  . INFLUENZA  . GERD  . ENDOMETRIAL POLYP  . POSTMENOPAUSAL STATUS  . COMEDO  . ARTHRITIS, RHEUMATOID  . OSTEOARTHRITIS  . SPINAL STENOSIS  . MURMUR  . NEPHROLITHIASIS, HX OF  . Left sided abdominal pain  . Post herpetic neuralgia  . Elevated blood pressure  . Low back pain   Past Medical History  Diagnosis Date  . RA (rheumatoid arthritis)   . Allergic rhinitis   . Osteoarthritis   . Gastritis   . Hyperlipemia   . Former smoker   . Hemorrhoids   . Personal history of colonic polyps 1998    hyperplastic  . Esophageal reflux   . Fibula fracture 02/2005  . History of nephrolithiasis    Past Surgical History  Procedure Date  . Cholecystectomy   . Vaginal polyp excised 12/2000    benign  . Lipoma excision 11/2002  . Svt s/p surgery--? ablation   . Ep procedure 1994    SVT; normal echo  . Esophagogastroduodenoscopy 11/03    reactive gastropathy  . Colonoscopy 11/08    internal hemorrhoids   History  Substance Use Topics  . Smoking status: Former Smoker    Quit date: 10/21/2003  . Smokeless tobacco:  Not on file  . Alcohol Use: No   Family History  Problem Relation Age of Onset  . Colon cancer Father 48  . Diabetes Mother   . Heart disease Mother   . Hypertension Brother   . Breast cancer Sister   . Diabetes Other     nephews  . Kidney disease Mother     ESRD  . Stroke Mother    Allergies  Allergen Reactions  . Cetirizine Hcl     REACTION: does not work  . Loratadine     REACTION: does not work   Current Outpatient Prescriptions on File Prior to Visit  Medication Sig Dispense Refill  . Azelastine HCl (ASTEPRO) 0.15 % SOLN Use 2 sprays two times a day as needed       . Calcium & Magnesium Carbonates (MYLANTA PO) Take 1 capsule by mouth as directed.        . cyclobenzaprine (FLEXERIL) 10 MG tablet Take 10 mg by mouth at bedtime as needed.        . dicyclomine (BENTYL) 10 MG capsule Take 1 capsule (10 mg total) by mouth 2 (two) times daily as needed (spasms  and cramping).  60 capsule  1  . fexofenadine (ALLEGRA) 180 MG tablet Take 180 mg by mouth daily.        . folic acid (FOLVITE) 1 MG tablet Take 1 mg by mouth daily.        . methotrexate (RHEUMATREX) 2.5 MG tablet Take 7.5 mg by mouth once a week.        Marland Kitchen NEXIUM 40 MG capsule TAKE 1 CAPSULE BY MOUTH TWICE DAILY  60 capsule  11  . acetaminophen (TYLENOL) 325 MG tablet Take 650 mg by mouth as needed.        Marland Kitchen albuterol (PROAIR HFA) 108 (90 BASE) MCG/ACT inhaler Inhale 2 puffs into the lungs every 4 (four) hours as needed.        . ALPRAZolam (XANAX) 0.25 MG tablet Take 1 tablet (0.25 mg total) by mouth daily as needed for anxiety (take only if needed ).  30 tablet  0  . EPINEPHrine (EPIPEN IJ) Inject as directed as needed.        . Probiotic Product (ALIGN) 4 MG CAPS Take 4 mg by mouth 2 (two) times daily.  14 capsule  0          Review of Systems Review of Systems  Constitutional: Negative for fever, appetite change, fatigue and unexpected weight change.  Eyes: Negative for pain and visual disturbance.    Respiratory: Negative for cough and shortness of breath.   Cardiovascular: Negative for cp or palpitations    Gastrointestinal: Negative for nausea, diarrhea and constipation.  Genitourinary: Negative for urgency and frequency.  Skin: Negative for pallor or rash  pos for pain from shingles  MSK pos for low back pain that radiates to legs, also generalized joint pain from RA Neurological: Negative for weakness, light-headedness, numbness and headaches.  Hematological: Negative for adenopathy. Does not bruise/bleed easily.  Psychiatric/Behavioral: Negative for dysphoric mood. The patient is not nervous/anxious.          Objective:   Physical Exam  Constitutional: She appears well-developed and well-nourished.  HENT:  Head: Normocephalic and atraumatic.  Mouth/Throat: Oropharynx is clear and moist.  Eyes: Conjunctivae and EOM are normal. Pupils are equal, round, and reactive to light.  Neck: Normal range of motion. Neck supple. No JVD present. Carotid bruit is not present. No thyromegaly present.  Cardiovascular: Normal rate, regular rhythm, normal heart sounds and intact distal pulses.   Pulmonary/Chest: Effort normal and breath sounds normal. No respiratory distress. She has no wheezes. She exhibits tenderness.       Tender over soft tissue and ribs in the T9 distribution front and back   Abdominal: Soft. Bowel sounds are normal. She exhibits no distension. There is no tenderness.  Musculoskeletal: She exhibits tenderness. She exhibits no edema.  Lymphadenopathy:    She has no cervical adenopathy.  Neurological: She is alert. She has normal reflexes. No cranial nerve deficit. Coordination normal.  Skin: Skin is warm. No rash noted. No erythema. No pallor.       Rash from shingles is now resolved  Psychiatric: She has a normal mood and affect.          Assessment & Plan:

## 2011-07-09 NOTE — Assessment & Plan Note (Signed)
Up today - ? If early HTN or if it is from pain  Will watch salt in diet and avoid decongestants Nurse visit for bp in 2 weeks - then make plan

## 2011-07-09 NOTE — Assessment & Plan Note (Signed)
No rash remains- but pt still has burning pain in T9 distribution (took full course of acyclovir) tx with gabapentin prn bid - adv of sedation Will update if not imp

## 2011-07-09 NOTE — Assessment & Plan Note (Signed)
I think this is separate from shingles Some radicular pain  Adv her to address with her rheumatologist at next visit  ? Deg change/ disc dz or RA Gabapentin may help

## 2011-07-23 ENCOUNTER — Ambulatory Visit (INDEPENDENT_AMBULATORY_CARE_PROVIDER_SITE_OTHER): Payer: Medicare Other | Admitting: Family Medicine

## 2011-07-23 VITALS — BP 140/76 | HR 76 | Temp 97.5°F | Resp 20

## 2011-07-23 DIAGNOSIS — R030 Elevated blood-pressure reading, without diagnosis of hypertension: Secondary | ICD-10-CM

## 2011-07-23 NOTE — Progress Notes (Signed)
  Subjective:    Patient ID: [name], female    DOB: October 25, 1940, 70 y.o.   MRN: [id number]  HPI Pt here for nurse visit bp check, she states that her bp was elevated when she was last seen, but she had eaten spicy food and thought that was the cause.    Review of Systems     Objective:   Physical Exam        Assessment & Plan:

## 2011-07-24 NOTE — Assessment & Plan Note (Signed)
This blood pressure was better than the last one but still a little high for her Want to watch it  inst in lifestyle change F/u in 2-3 mo

## 2011-07-24 NOTE — Patient Instructions (Signed)
Your bp is improved but still borderline high  Please watch sodium in diet and drink water  Also be as active as you can F/u 2-3 mo for bp visit with me unless already planned earlier

## 2011-09-16 ENCOUNTER — Telehealth: Payer: Self-pay | Admitting: Gastroenterology

## 2011-09-16 NOTE — Telephone Encounter (Signed)
Called patient and told her I left samples of Nexium up front for her to pick up. Pt agreed and verbalized understanding.

## 2011-09-17 ENCOUNTER — Telehealth: Payer: Self-pay

## 2011-09-17 NOTE — Telephone Encounter (Signed)
She can have them if we have some

## 2011-09-17 NOTE — Telephone Encounter (Signed)
Patient notified as instructed by telephone. Pt given Nexium 40 mg #20 Lot # [id number] and exp date 04/2014. Samples left at front office for pick up.

## 2011-09-17 NOTE — Telephone Encounter (Signed)
Pt is in donut hole with medicare and request Nexium 40 mg samples.Please advise.

## 2012-05-05 ENCOUNTER — Ambulatory Visit (INDEPENDENT_AMBULATORY_CARE_PROVIDER_SITE_OTHER): Payer: Medicare Other | Admitting: Gastroenterology

## 2012-05-05 ENCOUNTER — Encounter: Payer: Self-pay | Admitting: Gastroenterology

## 2012-05-05 VITALS — BP 150/80 | HR 80 | Ht 67.0 in | Wt 200.2 lb

## 2012-05-05 DIAGNOSIS — K219 Gastro-esophageal reflux disease without esophagitis: Secondary | ICD-10-CM

## 2012-05-05 DIAGNOSIS — R1319 Other dysphagia: Secondary | ICD-10-CM

## 2012-05-05 MED ORDER — DICYCLOMINE HCL 10 MG PO CAPS: 10.0000 mg | ORAL_CAPSULE | Freq: Two times a day (BID) | ORAL | Status: DC | PRN

## 2012-05-05 NOTE — Patient Instructions (Addendum)
You have been scheduled for a Barium Esophogram at [facility] (1st floor of the hospital) on 05/06/12 at 9:30am. Please arrive 15 minutes prior to your appointment for registration. Make certain not to have anything to eat or drink 6 hours prior to your test. If you need to reschedule for any reason, please contact radiology at [phone] to do so. Continue taking your Bentyl as needed for abdominal pain and cramping. cc: Roxy Manns, MD

## 2012-05-05 NOTE — Progress Notes (Signed)
History of Present Illness: This is a 71 year old female who returns in followup for epigastric pain, bloating and occasional difficulty swallowing. She was evaluated by Mike Gip, PAC several weeks ago and felt to have gastroenteritis. She was advised to take dicyclomine but it is unclear in speaking with her if the prescription was filled and whether she is using or not. She notes that her abdominal pain and bloating has substantially improved and these symptoms are only minimal at this time. Of more concern to her is intermittent difficulty swallowing solid and soft foods. She underwent upper endoscopy in 2010 for similar symptoms which was normal.  Current Medications, Allergies, Past Medical History, Past Surgical History, Family History and Social History were reviewed in [employer] record.  Physical Exam: General: Well developed , well nourished, no acute distress Head: Normocephalic and atraumatic Eyes:  sclerae anicteric, EOMI Ears: Normal auditory acuity Mouth: No deformity or lesions Lungs: Clear throughout to auscultation Heart: Regular rate and rhythm; no murmurs, rubs or bruits Abdomen: Soft, non tender and non distended. No masses, hepatosplenomegaly or hernias noted. Normal Bowel sounds Musculoskeletal: Symmetrical with no gross deformities  Pulses:  Normal pulses noted Extremities: No clubbing, cyanosis, edema or deformities noted Neurological: Alert oriented x 4, grossly nonfocal Psychological:  Alert and cooperative. Normal mood and affect  Assessment and Recommendations:  1. GERD with intermittent dysphasia to soft and solid foods. Upper endoscopy performed for same symptoms in 2010 was unremarkable. Schedule barium esophagram. I think her symptoms are most likely due to reflux and the symptoms. Continue Nexium 40 mg twice daily and all standard antireflux measures.  2. Epigastric pain and bloating. She is advised to take dicyclomine twice a day  as needed.  3. Family history of colon cancer. She is due for surveillance colonoscopy in November 2013.

## 2012-05-06 ENCOUNTER — Ambulatory Visit (HOSPITAL_COMMUNITY)
Admission: RE | Admit: 2012-05-06 | Discharge: 2012-05-06 | Disposition: A | Discharge status: Home / Self Care | Payer: Medicare Other | Source: Ambulatory Visit | Attending: Gastroenterology | Admitting: Gastroenterology

## 2012-05-06 DIAGNOSIS — Z981 Arthrodesis status: Secondary | ICD-10-CM | POA: Insufficient documentation

## 2012-05-06 DIAGNOSIS — R1319 Other dysphagia: Secondary | ICD-10-CM

## 2012-05-06 DIAGNOSIS — K219 Gastro-esophageal reflux disease without esophagitis: Secondary | ICD-10-CM

## 2012-05-06 DIAGNOSIS — R1310 Dysphagia, unspecified: Secondary | ICD-10-CM | POA: Insufficient documentation

## 2012-05-27 ENCOUNTER — Other Ambulatory Visit: Payer: Self-pay | Admitting: Gastroenterology

## 2012-07-12 ENCOUNTER — Encounter: Payer: Self-pay | Admitting: Family Medicine

## 2012-07-12 ENCOUNTER — Ambulatory Visit (INDEPENDENT_AMBULATORY_CARE_PROVIDER_SITE_OTHER)
Admission: RE | Admit: 2012-07-12 | Discharge: 2012-07-12 | Disposition: A | Discharge status: Home / Self Care | Payer: Medicare Other | Source: Ambulatory Visit | Attending: Family Medicine | Admitting: Family Medicine

## 2012-07-12 ENCOUNTER — Ambulatory Visit (INDEPENDENT_AMBULATORY_CARE_PROVIDER_SITE_OTHER): Payer: Medicare Other | Admitting: Family Medicine

## 2012-07-12 VITALS — BP 150/80 | HR 82 | Temp 98.2°F | Ht 67.5 in | Wt 203.2 lb

## 2012-07-12 DIAGNOSIS — M25512 Pain in left shoulder: Secondary | ICD-10-CM

## 2012-07-12 DIAGNOSIS — M25519 Pain in unspecified shoulder: Secondary | ICD-10-CM

## 2012-07-12 DIAGNOSIS — M545 Low back pain, unspecified: Secondary | ICD-10-CM

## 2012-07-12 MED ORDER — CYCLOBENZAPRINE HCL 10 MG PO TABS
10.0000 mg | ORAL_TABLET | Freq: Three times a day (TID) | ORAL | Status: DC | PRN
Start: 2012-07-12 — End: 2013-02-23

## 2012-07-12 NOTE — Assessment & Plan Note (Signed)
?   If from RA or poss rotator cuff pathology  Xray today and update Pt has f/u with Dr Gavin Potters next mo also

## 2012-07-12 NOTE — Progress Notes (Signed)
Subjective:    Patient ID: [name], female    DOB: [birth year], 71 y.o.   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 Lipoma  Hurts at times when she moves it  Hurts to reach in back of her  Feels deep in the shoulder    Patient Active Problem List  Diagnosis  . HYPERCHOLESTEROLEMIA  . TOBACCO ABUSE  . PSVT  . SINUSITIS - ACUTE-NOS  . ALLERGIC RHINITIS  . INFLUENZA  . GERD  . ENDOMETRIAL POLYP  . POSTMENOPAUSAL STATUS  . COMEDO  . ARTHRITIS, RHEUMATOID  . OSTEOARTHRITIS  . SPINAL STENOSIS  . MURMUR  . NEPHROLITHIASIS, HX OF  . Left sided abdominal pain  . Post herpetic neuralgia  . Elevated blood pressure  . Low back pain   Past Medical History  Diagnosis Date  . RA (rheumatoid arthritis)   . Allergic rhinitis   . Osteoarthritis   . Gastritis   . Hyperlipemia   . Former smoker   . Hemorrhoids   . Personal history of colonic polyps 1998    hyperplastic  . Esophageal reflux   . Fibula fracture 02/2005  . History of nephrolithiasis   . Shingles 2012   Past Surgical History  Procedure Date  . Cholecystectomy   . Vaginal polyp excised 12/2000    benign  . Lipoma excision 11/2002  . Svt s/p surgery--? ablation   . Ep procedure 1994    SVT; normal echo  . Esophagogastroduodenoscopy 11/03    reactive gastropathy  . Colonoscopy 11/08    internal hemorrhoids   History  Substance Use Topics  . Smoking status: Former Smoker    Quit date: 10/21/2003  . Smokeless tobacco: Never Used  . Alcohol Use: No   Family History  Problem  Relation Age of Onset  . Colon cancer Father 72  . Diabetes Mother   . Heart disease Mother   . Hypertension Brother   . Breast cancer Sister   . Diabetes Other     nephews  . Kidney disease Mother     ESRD  . Stroke Mother    Allergies  Allergen Reactions  . Cetirizine Hcl     REACTION: does not work  . Loratadine     REACTION: does not work   Current Outpatient Prescriptions on File Prior to Visit  Medication Sig Dispense Refill  . acetaminophen (TYLENOL) 325 MG tablet Take 650 mg by mouth as needed.        Marland Kitchen albuterol (PROAIR HFA) 108 (90 BASE) MCG/ACT inhaler Inhale 2 puffs into the lungs every 4 (four) hours as needed.        . ALPRAZolam (XANAX) 0.25 MG tablet Take 1 tablet (0.25 mg total) by mouth daily as needed for anxiety (take only if needed ).  30 tablet  0  . Azelastine HCl (ASTEPRO) 0.15 % SOLN Use 2 sprays two times a day as needed       .  Calcium & Magnesium Carbonates (MYLANTA PO) Take 1 capsule by mouth as directed.        . cyclobenzaprine (FLEXERIL) 10 MG tablet Take 10 mg by mouth at bedtime as needed.        . dicyclomine (BENTYL) 10 MG capsule Take 1 capsule (10 mg total) by mouth 2 (two) times daily as needed (spasms and cramping).  60 capsule  1  . EPINEPHrine (EPIPEN IJ) Inject as directed as needed.        . fexofenadine (ALLEGRA) 180 MG tablet Take 180 mg by mouth 2 (two) times daily.       . folic acid (FOLVITE) 1 MG tablet Take 1 mg by mouth daily.        . methotrexate (RHEUMATREX) 2.5 MG tablet Take 7.5 mg by mouth once a week.        Marland Kitchen NEXIUM 40 MG capsule TAKE 1 CAPSULE BY MOUTH TWICE DAILY  60 capsule  7  . gabapentin (NEURONTIN) 300 MG capsule Take 1 capsule (300 mg total) by mouth 2 (two) times daily as needed.  60 capsule  2  . Probiotic Product (ALIGN) 4 MG CAPS Take 4 mg by mouth 2 (two) times daily.  14 capsule  0      Review of Systems Review of Systems  Constitutional: Negative for fever, appetite change, fatigue and unexpected weight  change.  Eyes: Negative for pain and visual disturbance.  Respiratory: Negative for cough and shortness of breath.   Cardiovascular: Negative for cp or palpitations    Gastrointestinal: Negative for nausea, diarrhea and constipation.  Genitourinary: Negative for urgency and frequency.  Skin: Negative for pallor or rash   MSK pos for baseline joint pain , without swelling recently, pos for back pain  Neurological: Negative for weakness, light-headedness, numbness and headaches.  Hematological: Negative for adenopathy. Does not bruise/bleed easily.  Psychiatric/Behavioral: Negative for dysphoric mood. The patient is not nervous/anxious.         Objective:   Physical Exam  Constitutional: She appears well-developed and well-nourished. No distress.  HENT:  Head: Normocephalic and atraumatic.  Mouth/Throat: Oropharynx is clear and moist.  Eyes: Conjunctivae normal and EOM are normal. Pupils are equal, round, and reactive to light.  Neck: Normal range of motion. Neck supple.  Cardiovascular: Normal rate and regular rhythm.   Murmur heard. Pulmonary/Chest: Effort normal and breath sounds normal.  Abdominal: Soft. Bowel sounds are normal.       No suprapubic tenderness or fullness    Musculoskeletal:       Left shoulder: She exhibits decreased range of motion and tenderness. She exhibits no bony tenderness, no swelling, no effusion, no crepitus, no deformity, normal pulse and normal strength.       Lumbar back: She exhibits decreased range of motion, tenderness, bony tenderness and spasm. She exhibits no swelling and normal pulse.       LS- tender over L5- S1 bony Neg SLR Nl rom hips  No piriformis tenderness Pain to flex/ ext and lateral bend  Nl gait  L shoulder Mild acromion tenderness Pos Neer test , equivicol Hawking's Much pain to int/ ext rotate but can abduct well  Apprehensive to extend the shoulder   Lymphadenopathy:    She has no cervical adenopathy.  Neurological: She  is alert. She has normal reflexes. She displays no atrophy. No cranial nerve deficit or sensory deficit. She exhibits normal muscle tone. Coordination and gait normal.  Skin: Skin is warm and dry. No  rash noted. No erythema. No pallor.  Psychiatric: She has a normal mood and affect.          Assessment & Plan:

## 2012-07-12 NOTE — Patient Instructions (Addendum)
Xray of your left shoulder today  Will update you tomorrow  We will do ref to orthopedics for you back when you check out Flexeril may help with muscle spasm- careful , however as it can make you sleepy Gentle heat on back is helpful also  Keep walking

## 2012-07-12 NOTE — Assessment & Plan Note (Signed)
With hx of deg disc dz/ also RA and injections in the past that were helpful Some radicular sympt to R leg- but reassuring exam Will ref to ortho- Dr August Saucer has seen in past- for f/u Flexeril refilled for spasm also

## 2012-07-21 ENCOUNTER — Other Ambulatory Visit: Payer: Self-pay | Admitting: Orthopedic Surgery

## 2012-07-21 DIAGNOSIS — M545 Low back pain: Secondary | ICD-10-CM

## 2012-07-21 DIAGNOSIS — R531 Weakness: Secondary | ICD-10-CM

## 2012-07-27 ENCOUNTER — Ambulatory Visit
Admission: RE | Admit: 2012-07-27 | Discharge: 2012-07-27 | Disposition: A | Discharge status: Home / Self Care | Payer: Medicare Other | Source: Ambulatory Visit | Attending: Orthopedic Surgery | Admitting: Orthopedic Surgery

## 2012-07-27 DIAGNOSIS — M545 Low back pain: Secondary | ICD-10-CM

## 2012-07-27 DIAGNOSIS — R531 Weakness: Secondary | ICD-10-CM

## 2012-08-03 ENCOUNTER — Encounter: Payer: Self-pay | Admitting: Gastroenterology

## 2012-08-06 ENCOUNTER — Other Ambulatory Visit: Payer: Self-pay

## 2012-08-06 MED ORDER — FLUTICASONE PROPIONATE 50 MCG/ACT NA SUSP: 2.0000 | Freq: Every day | NASAL | Status: DC

## 2012-08-06 NOTE — Telephone Encounter (Signed)
Pt request refill on fluticasone prop to [facility]. Is not on med list.Please advise.

## 2012-08-06 NOTE — Telephone Encounter (Signed)
She has been on it before  I will send px - let her know

## 2012-08-06 NOTE — Telephone Encounter (Signed)
Left voicemail letting pt know Rx was sent to pharm.

## 2012-09-10 ENCOUNTER — Other Ambulatory Visit: Payer: Self-pay

## 2012-09-10 MED ORDER — DICYCLOMINE HCL 10 MG PO CAPS: 10.0000 mg | ORAL_CAPSULE | Freq: Two times a day (BID) | ORAL | Status: DC | PRN

## 2012-09-17 ENCOUNTER — Telehealth: Payer: Self-pay | Admitting: *Deleted

## 2012-09-17 NOTE — Telephone Encounter (Signed)
Patient calling asking for samples of nexium, Spoke with patient and advised we do have samples and will leave some at the front desk, [place] and can't get refill until 10/20/2012.

## 2012-11-18 ENCOUNTER — Other Ambulatory Visit: Payer: Self-pay | Admitting: Gastroenterology

## 2012-11-18 ENCOUNTER — Encounter: Payer: Self-pay | Admitting: Gastroenterology

## 2012-11-18 MED ORDER — ESOMEPRAZOLE MAGNESIUM 40 MG PO CPDR: DELAYED_RELEASE_CAPSULE | ORAL | Status: DC

## 2012-11-18 NOTE — Telephone Encounter (Signed)
Prescription sent to patient's pharmacy.

## 2012-12-09 ENCOUNTER — Ambulatory Visit (AMBULATORY_SURGERY_CENTER): Payer: Medicare Other | Admitting: *Deleted

## 2012-12-09 VITALS — Ht 67.0 in | Wt 202.7 lb

## 2012-12-09 DIAGNOSIS — Z1211 Encounter for screening for malignant neoplasm of colon: Secondary | ICD-10-CM

## 2012-12-09 DIAGNOSIS — Z800 Family history of malignant neoplasm of digestive organs: Secondary | ICD-10-CM

## 2012-12-09 MED ORDER — PEG-KCL-NACL-NASULF-NA ASC-C 100 G PO SOLR: ORAL | Status: DC

## 2012-12-09 NOTE — Progress Notes (Signed)
No egg or soy allergy 

## 2012-12-22 ENCOUNTER — Telehealth: Payer: Self-pay | Admitting: Gastroenterology

## 2012-12-22 NOTE — Telephone Encounter (Signed)
Patient advised to drink the remainder of her mixed Moviprep tonight, follow the instructions for tomorrow am.  She is asked to get a bottle of Mag Citrate.  If she is not clear in the am, she needs to drink the bottle of Mag Citrate.  Dr. Russella Dar aware and agrees with the plan.  Patient verbalized understanding of all instructions

## 2012-12-22 NOTE — Telephone Encounter (Signed)
Returned patient's call. Patient misunderstood on when to start her prep and she drank 1/2 of first bottle Monday morning and realized she wasn't suppose to start it until 5 pm this evening.  I advised her that the remainder of the first bottle will not be good since it has already been mixed up.   I advised her I would ask Dr. Russella Dar on what he wants to do and give her a call back.  She also stated that she has not eaten anything since Sunday as her husband told her these instructions which were incorrect.   Dr. Russella Dar what would you like for patient do?

## 2012-12-22 NOTE — Telephone Encounter (Signed)
Please sort this out. Not clear on the problem or question

## 2012-12-23 ENCOUNTER — Encounter: Payer: Self-pay | Admitting: Gastroenterology

## 2012-12-23 ENCOUNTER — Ambulatory Visit (AMBULATORY_SURGERY_CENTER): Payer: Medicare Other | Admitting: Gastroenterology

## 2012-12-23 VITALS — BP 159/76 | HR 63 | Temp 97.8°F | Resp 17 | Ht 67.0 in | Wt 202.0 lb

## 2012-12-23 DIAGNOSIS — Z1211 Encounter for screening for malignant neoplasm of colon: Secondary | ICD-10-CM

## 2012-12-23 DIAGNOSIS — Z800 Family history of malignant neoplasm of digestive organs: Secondary | ICD-10-CM

## 2012-12-23 MED ORDER — SODIUM CHLORIDE 0.9 % IV SOLN: 500.0000 mL | INTRAVENOUS | Status: DC

## 2012-12-23 NOTE — Progress Notes (Signed)
Stable to RR 

## 2012-12-23 NOTE — Op Note (Signed)
[facility] [address] [place], [address]   COLONOSCOPY PROCEDURE REPORT  PATIENT: Amy, [name]  MR#: [id number] BIRTHDATE: 10/02/1941 , 71  yrs. old GENDER: Female ENDOSCOPIST: Meryl Dare, MD, [facility] PROCEDURE DATE:  12/23/2012 PROCEDURE:   Colonoscopy, screening ASA CLASS:   Class II INDICATIONS:elevated risk screening and Patient's immediate family history of colon cancer. MEDICATIONS: MAC sedation, administered by CRNA and propofol (Diprivan) 150mg  IV DESCRIPTION OF PROCEDURE:   After the risks benefits and alternatives of the procedure were thoroughly explained, informed consent was obtained.  A digital rectal exam revealed no abnormalities of the rectum.   The LB [id number]  endoscope was introduced through the anus and advanced to the cecum, which was identified by both the appendix and ileocecal valve. No adverse events experienced.   The quality of the prep was good, using MoviPrep  The instrument was then slowly withdrawn as the colon was fully examined.  COLON FINDINGS: Mild diverticulosis was noted in the sigmoid colon. The colon was otherwise normal.  There was no diverticulosis, inflammation, polyps or cancers unless previously stated. Retroflexed views revealed moderate internal hemorrhoids. The time to cecum=3 minutes 44 seconds.  Withdrawal time=8 minutes 46 seconds.  The scope was withdrawn and the procedure completed.  COMPLICATIONS: There were no complications.  ENDOSCOPIC IMPRESSION: 1.   Mild diverticulosis was noted in the sigmoid colon 2.   Moderate internal hemorrhoids  RECOMMENDATIONS: 1.  High fiber diet with liberal fluid intake. 2.  Repeat Colonoscopy in 5 years.   eSigned:  Meryl Dare, MD, [facility] 12/23/2012 11:53 AM

## 2012-12-23 NOTE — Progress Notes (Signed)
Patient did not experience any of the following events: a burn prior to discharge; a fall within the facility; wrong site/side/patient/procedure/implant event; or a hospital transfer or hospital admission upon discharge from the facility. (G8907) Patient did not have preoperative order for IV antibiotic SSI prophylaxis. (G8918)  

## 2012-12-23 NOTE — Patient Instructions (Addendum)
Impressions/Recommendations:  Diverticulosis (handout given) Hemorrhoids (handout given)  High Fiber Diet (handout given)  Repeat colonoscopy in 5 years.  YOU HAD AN ENDOSCOPIC PROCEDURE TODAY AT THE [facility]: Refer to the procedure report that was given to you for any specific questions about what was found during the examination.  If the procedure report does not answer your questions, please call your gastroenterologist to clarify.  If you requested that your care partner not be given the details of your procedure findings, then the procedure report has been included in a sealed envelope for you to review at your convenience later.  YOU SHOULD EXPECT: Some feelings of bloating in the abdomen. Passage of more gas than usual.  Walking can help get rid of the air that was put into your GI tract during the procedure and reduce the bloating. If you had a lower endoscopy (such as a colonoscopy or flexible sigmoidoscopy) you may notice spotting of blood in your stool or on the toilet paper. If you underwent a bowel prep for your procedure, then you may not have a normal bowel movement for a few days.  DIET: Your first meal following the procedure should be a light meal and then it is ok to progress to your normal diet.  A half-sandwich or bowl of soup is an example of a good first meal.  Heavy or fried foods are harder to digest and may make you feel nauseous or bloated.  Likewise meals heavy in dairy and vegetables can cause extra gas to form and this can also increase the bloating.  Drink plenty of fluids but you should avoid alcoholic beverages for 24 hours.  ACTIVITY: Your care partner should take you home directly after the procedure.  You should plan to take it easy, moving slowly for the rest of the day.  You can resume normal activity the day after the procedure however you should NOT DRIVE or use heavy machinery for 24 hours (because of the sedation medicines used during the  test).    SYMPTOMS TO REPORT IMMEDIATELY: A gastroenterologist can be reached at any hour.  During normal business hours, 8:30 AM to 5:00 PM Monday through Friday, call [phone].  After hours and on weekends, please call the GI answering service at [phone] who will take a message and have the physician on call contact you.   Following lower endoscopy (colonoscopy or flexible sigmoidoscopy):  Excessive amounts of blood in the stool  Significant tenderness or worsening of abdominal pains  Swelling of the abdomen that is new, acute  Fever of 100F or higher  FOLLOW UP: If any biopsies were taken you will be contacted by phone or by letter within the next 1-3 weeks.  Call your gastroenterologist if you have not heard about the biopsies in 3 weeks.  Our staff will call the home number listed on your records the next business day following your procedure to check on you and address any questions or concerns that you may have at that time regarding the information given to you following your procedure. This is a courtesy call and so if there is no answer at the home number and we have not heard from you through the emergency physician on call, we will assume that you have returned to your regular daily activities without incident.  SIGNATURES/CONFIDENTIALITY: You and/or your care partner have signed paperwork which will be entered into your electronic medical record.  These signatures attest to the fact that that the  information above on your After Visit Summary has been reviewed and is understood.  Full responsibility of the confidentiality of this discharge information lies with you and/or your care-partner.

## 2012-12-24 ENCOUNTER — Telehealth: Payer: Self-pay

## 2012-12-24 NOTE — Telephone Encounter (Signed)
  Follow up Call-  Call back number 12/23/2012  Post procedure Call Back phone  # [phone]  Permission to leave phone message Yes     Patient questions:  Do you have a fever, pain , or abdominal swelling? no Pain Score  0 *  Have you tolerated food without any problems? yes  Have you been able to return to your normal activities? yes  Do you have any questions about your discharge instructions: Diet   no Medications  no Follow up visit  no  Do you have questions or concerns about your Care? no  Actions: * If pain score is 4 or above: No action needed, pain <4.

## 2013-02-23 ENCOUNTER — Encounter: Payer: Self-pay | Admitting: Family Medicine

## 2013-02-23 ENCOUNTER — Ambulatory Visit (INDEPENDENT_AMBULATORY_CARE_PROVIDER_SITE_OTHER): Payer: Medicare Other | Admitting: Family Medicine

## 2013-02-23 ENCOUNTER — Ambulatory Visit (INDEPENDENT_AMBULATORY_CARE_PROVIDER_SITE_OTHER)
Admission: RE | Admit: 2013-02-23 | Discharge: 2013-02-23 | Disposition: A | Discharge status: Home / Self Care | Payer: Medicare Other | Source: Ambulatory Visit | Attending: Family Medicine | Admitting: Family Medicine

## 2013-02-23 VITALS — BP 140/82 | HR 77 | Temp 98.8°F | Ht 67.5 in | Wt 199.2 lb

## 2013-02-23 DIAGNOSIS — M79609 Pain in unspecified limb: Secondary | ICD-10-CM

## 2013-02-23 DIAGNOSIS — M79671 Pain in right foot: Secondary | ICD-10-CM

## 2013-02-23 DIAGNOSIS — M722 Plantar fascial fibromatosis: Secondary | ICD-10-CM | POA: Insufficient documentation

## 2013-02-23 DIAGNOSIS — M069 Rheumatoid arthritis, unspecified: Secondary | ICD-10-CM

## 2013-02-23 NOTE — Progress Notes (Signed)
Subjective:    Patient ID: [name], female    DOB: 07/14/1941, 72 y.o.   MRN: [id number]  HPI Here with leg and foot Started in lower part of back and then top hip and then to her heel    Has hx of back problems  Saw Dr Alvester Morin for a shot in her back - that helped a bit   Is painful to put weight on her R foot  Bending forward makes it worse also  No numbness or weakness from this  No swelling in the foot  No hx of trauma   Will see Dr Earlene Plater her rheumatologist-needs ref  Off of any methotrexate and prednisone for a while  She thought it made her gain weight  Was also on prednisone  Cannot take nsaid due to stomach issues   Patient Active Problem List   Diagnosis Date Noted  . Left shoulder pain 07/12/2012  . Post herpetic neuralgia 07/09/2011  . Elevated blood pressure 07/09/2011  . Low back pain 07/09/2011  . Left sided abdominal pain 06/12/2011  . COMEDO 11/09/2009  . ARTHRITIS, RHEUMATOID 07/23/2007  . GERD 07/09/2007  . HYPERCHOLESTEROLEMIA 03/19/2007  . TOBACCO ABUSE 03/19/2007  . PSVT 03/19/2007  . ALLERGIC RHINITIS 03/19/2007  . ENDOMETRIAL POLYP 03/19/2007  . POSTMENOPAUSAL STATUS 03/19/2007  . OSTEOARTHRITIS 03/19/2007  . SPINAL STENOSIS 03/19/2007  . MURMUR 03/19/2007  . NEPHROLITHIASIS, HX OF 03/19/2007   Past Medical History  Diagnosis Date  . RA (rheumatoid arthritis)   . Allergic rhinitis   . Osteoarthritis   . Gastritis   . Hyperlipemia   . Former smoker   . Hemorrhoids   . Personal history of colonic polyps 1998    hyperplastic  . Esophageal reflux   . Fibula fracture 02/2005  . History of nephrolithiasis   . Shingles 2012  . Anxiety   . Heart murmur    Past Surgical History  Procedure Laterality Date  . Cholecystectomy    . Vaginal polyp excised  12/2000    benign  . Lipoma excision  11/2002  . Svt s/p surgery--? ablation    . Ep procedure  1994    SVT; normal echo  . Esophagogastroduodenoscopy  11/03    reactive  gastropathy  . Colonoscopy  11/08    internal hemorrhoids  . Upper gastrointestinal endoscopy     History  Substance Use Topics  . Smoking status: Former Smoker    Quit date: 10/21/2003  . Smokeless tobacco: Never Used  . Alcohol Use: No   Family History  Problem Relation Age of Onset  . Colon cancer Father 72  . Diabetes Mother   . Heart disease Mother   . Kidney disease Mother     ESRD  . Stroke Mother   . Hypertension Brother   . Breast cancer Sister   . Diabetes Other     nephews  . Colon cancer Sister   . Esophageal cancer Neg Hx   . Stomach cancer Neg Hx   . Rectal cancer Neg Hx    Allergies  Allergen Reactions  . Bee Venom Anaphylaxis  . Cetirizine Hcl     REACTION: does not work   Current Outpatient Prescriptions on File Prior to Visit  Medication Sig Dispense Refill  . acetaminophen (TYLENOL) 325 MG tablet Take 650 mg by mouth as needed.        . ALPRAZolam (XANAX) 0.25 MG tablet Take 1 tablet (0.25 mg total) by mouth daily as needed for  anxiety (take only if needed ).  30 tablet  0  . Azelastine HCl (ASTEPRO) 0.15 % SOLN Use 2 sprays two times a day as needed       . Calcium & Magnesium Carbonates (MYLANTA PO) Take 1 capsule by mouth as directed.        . dicyclomine (BENTYL) 10 MG capsule Take 1 capsule (10 mg total) by mouth 2 (two) times daily as needed (spasms and cramping).  60 capsule  5  . EPINEPHrine (EPIPEN IJ) Inject as directed as needed.        Marland Kitchen esomeprazole (NEXIUM) 40 MG capsule TAKE 1 CAPSULE BY MOUTH TWICE DAILY  60 capsule  7  . fluticasone (FLONASE) 50 MCG/ACT nasal spray Place 2 sprays into the nose daily.  16 g  6  . folic acid (FOLVITE) 1 MG tablet Take 1 mg by mouth daily.        Marland Kitchen gabapentin (NEURONTIN) 300 MG capsule Take 300 mg by mouth 2 (two) times daily.      Marland Kitchen loratadine (CLARITIN) 10 MG tablet Take 10 mg by mouth daily.      . methotrexate (RHEUMATREX) 2.5 MG tablet Take 7.5 mg by mouth once a week.        Marland Kitchen albuterol (PROAIR  HFA) 108 (90 BASE) MCG/ACT inhaler Inhale 2 puffs into the lungs every 4 (four) hours as needed.        . predniSONE (STERAPRED UNI-PAK) 10 MG tablet Take 5 mg by mouth daily.       No current facility-administered medications on file prior to visit.      Review of Systems Review of Systems  Constitutional: Negative for fever, appetite change, fatigue and unexpected weight change.  Eyes: Negative for pain and visual disturbance.  Respiratory: Negative for cough and shortness of breath.   Cardiovascular: Negative for cp or palpitations    Gastrointestinal: Negative for nausea, diarrhea and constipation.  Genitourinary: Negative for urgency and frequency.  Skin: Negative for pallor or rash   MSK pos for joint pain with occ swelling/ back pain , and now heel pain  Neurological: Negative for weakness, light-headedness, numbness and headaches.  Hematological: Negative for adenopathy. Does not bruise/bleed easily.  Psychiatric/Behavioral: Negative for dysphoric mood. The patient is not nervous/anxious.         Objective:   Physical Exam  Constitutional: She appears well-developed and well-nourished. No distress.  overwt and well app  HENT:  Head: Normocephalic and atraumatic.  Eyes: Conjunctivae and EOM are normal. Pupils are equal, round, and reactive to light.  Neck: Normal range of motion. Neck supple.  Cardiovascular: Normal rate and regular rhythm.   Pulmonary/Chest: Effort normal and breath sounds normal. No respiratory distress. She has no wheezes.  Abdominal: Soft. Bowel sounds are normal.  Musculoskeletal: She exhibits tenderness. She exhibits no edema.       Lumbar back: She exhibits normal range of motion, no bony tenderness and no swelling.       Right upper leg: She exhibits no tenderness, no bony tenderness and no swelling.       Right lower leg: She exhibits no tenderness, no bony tenderness and no swelling.       Right foot: She exhibits tenderness and bony tenderness.  She exhibits normal range of motion, no swelling, normal capillary refill, no crepitus and no deformity.  R foot - tender over the calcaneous Some pain with dorsiflex of ankle - but rom is full No swelling/ redness or  change in joints  Toe joints are not tender Favors other foot to walk initially-then this improves   Lymphadenopathy:    She has no cervical adenopathy.  Neurological: She is alert. She has normal strength and normal reflexes. She displays no atrophy. No sensory deficit. She exhibits normal muscle tone.  Skin: Skin is warm and dry. No rash noted. No erythema. No pallor.  Psychiatric: She has a normal mood and affect.          Assessment & Plan:

## 2013-02-23 NOTE — Patient Instructions (Addendum)
I think you have plantar fasciitis of the heel I want to do an xray today- we will call you with results  Wear a hard soled shoe with good support at all times - do not go barefoot  Put your shoes by the bed  Get a frozen can and roll your foot over it twice daily for 5 minutes - to help the inflammation See Shirlee Limerick up front to get appt to Dr Earlene Plater

## 2013-02-24 NOTE — Assessment & Plan Note (Signed)
Suspect from plantar fasciitis due to tenderness

## 2013-02-24 NOTE — Assessment & Plan Note (Signed)
Pt is off all tx now and needs to return to her rheumatologist  Expressed concern that without tx this could worsen and permanently hurt her joints  Will do ref to Dr Earlene Plater

## 2013-02-24 NOTE — Assessment & Plan Note (Signed)
Heel pain- worse on initiation of walking and also dorseflex of foot  Pt cannot take nsaids Xray showed small calcaneal spurs  inst to wear supportive hard soled shoe/ use ice / stretches  Will return to Dr Earlene Plater  Orthotics or strapping may be necessary in future

## 2013-03-02 ENCOUNTER — Encounter: Payer: Self-pay | Admitting: *Deleted

## 2013-03-09 ENCOUNTER — Telehealth: Payer: Self-pay

## 2013-03-09 NOTE — Telephone Encounter (Signed)
Pt requesting refill hydrocodone apap 5-325 mg to [facility]; pt said prescribed by another doctor (cannot read name on bottle) for leg, foot and back pain. Please advise. Pt got xray report.

## 2013-03-09 NOTE — Telephone Encounter (Signed)
Pt notified of Dr. Milinda Antis recommendations. Pt said since she can't tell who wrote the Rx she will call pharmacy and get the name of the doctor from them, and call that doctor directly

## 2013-03-09 NOTE — Telephone Encounter (Signed)
This cannot come from 2 different doctors since it is a controlled substance - I will need to see her and get a note from prev px doctor that I am taking over that med  She should probably call the doc who px it

## 2013-08-10 ENCOUNTER — Ambulatory Visit (INDEPENDENT_AMBULATORY_CARE_PROVIDER_SITE_OTHER): Payer: Medicare Other | Admitting: Family Medicine

## 2013-08-10 ENCOUNTER — Encounter: Payer: Self-pay | Admitting: Family Medicine

## 2013-08-10 VITALS — BP 130/78 | HR 78 | Temp 98.3°F | Ht 67.5 in | Wt 200.0 lb

## 2013-08-10 DIAGNOSIS — R1310 Dysphagia, unspecified: Secondary | ICD-10-CM | POA: Insufficient documentation

## 2013-08-10 DIAGNOSIS — K219 Gastro-esophageal reflux disease without esophagitis: Secondary | ICD-10-CM

## 2013-08-10 NOTE — Progress Notes (Signed)
Subjective:    Patient ID: [name], female    DOB: 1941/03/08, 72 y.o.   MRN: [id number]  HPI Here for heartburn and epigastric discomfort   Going on for about 4 days  Happened after eating a spinach salad  She takes nexium bid  Takes mylanta - it helps temporarily - takes it after eating  Eating makes her symptoms worse  Feels like food is getting stuck 1/2 way down- then it hurts to bend over  No regurgitation or vomiting  Pain is burning in nature  Feels very bloated   Is finishing a course or prednisone right now  No nsaids (just tylenol )   EGD was last 2010  Nl esophagus but had to stretch it - ? Happening again   No uri symptoms  No ST   Has had nl blood work for MTX from rheum Has had shots in her back for back pain as well   Patient Active Problem List   Diagnosis Date Noted  . Plantar fasciitis of right foot 02/23/2013  . Right foot pain 02/23/2013  . Left shoulder pain 07/12/2012  . Post herpetic neuralgia 07/09/2011  . Elevated blood pressure 07/09/2011  . Low back pain 07/09/2011  . Left sided abdominal pain 06/12/2011  . COMEDO 11/09/2009  . ARTHRITIS, RHEUMATOID 07/23/2007  . GERD 07/09/2007  . HYPERCHOLESTEROLEMIA 03/19/2007  . TOBACCO ABUSE 03/19/2007  . PSVT 03/19/2007  . ALLERGIC RHINITIS 03/19/2007  . ENDOMETRIAL POLYP 03/19/2007  . POSTMENOPAUSAL STATUS 03/19/2007  . OSTEOARTHRITIS 03/19/2007  . SPINAL STENOSIS 03/19/2007  . MURMUR 03/19/2007  . NEPHROLITHIASIS, HX OF 03/19/2007   Past Medical History  Diagnosis Date  . RA (rheumatoid arthritis)   . Allergic rhinitis   . Osteoarthritis   . Gastritis   . Hyperlipemia   . Former smoker   . Hemorrhoids   . Personal history of colonic polyps 1998    hyperplastic  . Esophageal reflux   . Fibula fracture 02/2005  . History of nephrolithiasis   . Shingles 2012  . Anxiety   . Heart murmur    Past Surgical History  Procedure Laterality Date  . Cholecystectomy    . Vaginal  polyp excised  12/2000    benign  . Lipoma excision  11/2002  . Svt s/p surgery--? ablation    . Ep procedure  1994    SVT; normal echo  . Esophagogastroduodenoscopy  11/03    reactive gastropathy  . Colonoscopy  11/08    internal hemorrhoids  . Upper gastrointestinal endoscopy     History  Substance Use Topics  . Smoking status: Former Smoker    Quit date: 10/21/2003  . Smokeless tobacco: Never Used  . Alcohol Use: No   Family History  Problem Relation Age of Onset  . Colon cancer Father 6  . Diabetes Mother   . Heart disease Mother   . Kidney disease Mother     ESRD  . Stroke Mother   . Hypertension Brother   . Breast cancer Sister   . Diabetes Other     nephews  . Colon cancer Sister   . Esophageal cancer Neg Hx   . Stomach cancer Neg Hx   . Rectal cancer Neg Hx    Allergies  Allergen Reactions  . Bee Venom Anaphylaxis  . Cetirizine Hcl     REACTION: does not work   Current Outpatient Prescriptions on File Prior to Visit  Medication Sig Dispense Refill  . acetaminophen (TYLENOL) 325  MG tablet Take 650 mg by mouth as needed.        Marland Kitchen albuterol (PROAIR HFA) 108 (90 BASE) MCG/ACT inhaler Inhale 2 puffs into the lungs every 4 (four) hours as needed.        . ALPRAZolam (XANAX) 0.25 MG tablet Take 1 tablet (0.25 mg total) by mouth daily as needed for anxiety (take only if needed ).  30 tablet  0  . Azelastine HCl (ASTEPRO) 0.15 % SOLN Use 2 sprays two times a day as needed       . Calcium & Magnesium Carbonates (MYLANTA PO) Take 1 capsule by mouth as directed.        . dicyclomine (BENTYL) 10 MG capsule Take 1 capsule (10 mg total) by mouth 2 (two) times daily as needed (spasms and cramping).  60 capsule  5  . EPINEPHrine (EPIPEN IJ) Inject as directed as needed.        Marland Kitchen esomeprazole (NEXIUM) 40 MG capsule TAKE 1 CAPSULE BY MOUTH TWICE DAILY  60 capsule  7  . fluticasone (FLONASE) 50 MCG/ACT nasal spray Place 2 sprays into the nose daily.  16 g  6  . folic acid  (FOLVITE) 1 MG tablet Take 1 mg by mouth daily.        Marland Kitchen gabapentin (NEURONTIN) 300 MG capsule Take 300 mg by mouth 2 (two) times daily.      Marland Kitchen loratadine (CLARITIN) 10 MG tablet Take 10 mg by mouth daily.      . methotrexate (RHEUMATREX) 2.5 MG tablet Take 7.5 mg by mouth once a week.        . predniSONE (STERAPRED UNI-PAK) 10 MG tablet Take 5 mg by mouth daily.       No current facility-administered medications on file prior to visit.    Review of Systems     Review of Systems  Constitutional: Negative for fever, appetite change, fatigue and unexpected weight change. ENT neg for ST or hoarseness   Eyes: Negative for pain and visual disturbance.  Respiratory: Negative for cough and shortness of breath.   Cardiovascular: Negative for cp or palpitations    Gastrointestinal: Negative for nausea, diarrhea and constipation. neg for blood in stool or dark stool MSK pos for joint pain from RA Genitourinary: Negative for urgency and frequency.  Skin: Negative for pallor or rash   Neurological: Negative for weakness, light-headedness, numbness and headaches.  Hematological: Negative for adenopathy. Does not bruise/bleed easily.  Psychiatric/Behavioral: Negative for dysphoric mood. The patient is not nervous/anxious.      Objective:   Physical Exam  Constitutional: She appears well-developed and well-nourished. No distress.  obese and well appearing   HENT:  Head: Normocephalic and atraumatic.  Eyes: Conjunctivae and EOM are normal. Pupils are equal, round, and reactive to light. No scleral icterus.  Neck: Normal range of motion. Neck supple. No JVD present. Carotid bruit is not present. No thyromegaly present.  Cardiovascular: Normal rate and regular rhythm.   Pulmonary/Chest: Breath sounds normal. No respiratory distress. She has no wheezes. She exhibits tenderness.  Some mild anterior and L lateral cw tenderness without crepitus or skin change   Abdominal: Soft. Normal appearance,  normal aorta and bowel sounds are normal. She exhibits no distension, no abdominal bruit, no ascites and no mass. There is no hepatosplenomegaly. There is tenderness in the epigastric area and left upper quadrant. There is no rigidity, no rebound, no guarding, no CVA tenderness, no tenderness at McBurney's point and negative Murphy's sign.  Musculoskeletal: She exhibits no edema.  Lymphadenopathy:    She has no cervical adenopathy.  Neurological: She is alert. She has normal reflexes.  Skin: Skin is warm and dry. No rash noted.  Psychiatric: She has a normal mood and affect.          Assessment & Plan:

## 2013-08-10 NOTE — Patient Instructions (Signed)
Continue your nexium twice daily and do not miss a dose  Eat a bland diet - avoid acidic or spicy foods  Also avoid chewy meats and food that is hard to swallow  Also avoid anti inflammatory medicines (ibuprofen/ aspirin or aleve)  We will refer you to GI at check out

## 2013-08-11 NOTE — Assessment & Plan Note (Signed)
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

## 2013-08-11 NOTE — Assessment & Plan Note (Signed)
?   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

## 2013-08-31 ENCOUNTER — Ambulatory Visit (INDEPENDENT_AMBULATORY_CARE_PROVIDER_SITE_OTHER): Payer: Medicare Other | Admitting: Gastroenterology

## 2013-08-31 ENCOUNTER — Encounter: Payer: Self-pay | Admitting: Gastroenterology

## 2013-08-31 VITALS — BP 140/78 | HR 90 | Ht 67.0 in | Wt 196.0 lb

## 2013-08-31 DIAGNOSIS — R1310 Dysphagia, unspecified: Secondary | ICD-10-CM

## 2013-08-31 DIAGNOSIS — K219 Gastro-esophageal reflux disease without esophagitis: Secondary | ICD-10-CM

## 2013-08-31 MED ORDER — ESOMEPRAZOLE MAGNESIUM 40 MG PO CPDR: DELAYED_RELEASE_CAPSULE | ORAL | Status: DC

## 2013-08-31 NOTE — Patient Instructions (Signed)
We have sent the following medications to your pharmacy for you to pick up at your convenience:Nexium and samples given today.   You have been scheduled for a modified barium swallow on 09/05/13 at 11:00am. Please arrive 15 minutes prior to your test for registration. You will go to [facility] (1st Floor) for your appointment. Please refrain from eating or drinking anything 4 hours prior to your test. Should you need to cancel or reschedule your appointment, please contact [phone] [facility]) or [phone] [facility]). _____________________________________________________________________ A Modified Barium Swallow Study, or MBS, is a special x-ray that is taken to check swallowing skills. It is carried out by a Marine scientist and a Warehouse manager (SLP). During this test, yourmouth, throat, and esophagus, a muscular tube which connects your mouth to your stomach, is checked. The test will help you, your doctor, and the SLP plan what types of foods and liquids are easier for you to swallow. The SLP will also identify positions and ways to help you swallow more easily and safely. What will happen during an MBS? You will be taken to an x-ray room and seated comfortably. You will be asked to swallow small amounts of food and liquid mixed with barium. Barium is a liquid or paste that allows images of your mouth, throat and esophagus to be seen on x-ray. The x-ray captures moving images of the food you are swallowing as it travels from your mouth through your throat and into your esophagus. This test helps identify whether food or liquid is entering your lungs (aspiration). The test also shows which part of your mouth or throat lacks strength or coordination to move the food or liquid in the right direction. This test typically takes 30 minutes to 1 hour to complete. _______________________________________________________________________

## 2013-08-31 NOTE — Progress Notes (Signed)
    History of Present Illness: This is a 72 year old female who relates occasional difficulty swallowing solids and liquids. She localizes the symptoms to the right side of her neck. She notes that swallowing liquids occasionally makes her choke and cough. She has frequent breakthrough reflux symptoms mainly related to dietary stressors such as fried foods and she has been advised to avoid such foods. She has occasional upper abdominal pain and bloating following meals that is relieved by dicyclomine. She underwent barium esophagram for similar symptoms her last year and that was unremarkable. Upper endoscopy in 2010 for similar symptoms was normal.   Current Medications, Allergies, Past Medical History, Past Surgical History, Family History and Social History were reviewed in [employer] record.  Physical Exam: General: Well developed , well nourished, no acute distress Head: Normocephalic and atraumatic Eyes:  sclerae anicteric, EOMI Ears: Normal auditory acuity Mouth: No deformity or lesions Lungs: Clear throughout to auscultation Heart: Regular rate and rhythm; no murmurs, rubs or bruits Abdomen: Soft, non tender and non distended. No masses, hepatosplenomegaly or hernias noted. Normal Bowel sounds Musculoskeletal: Symmetrical with no gross deformities  Pulses:  Normal pulses noted Extremities: No clubbing, cyanosis, edema or deformities noted Neurological: Alert oriented x 4, grossly nonfocal Psychological:  Alert and cooperative. Normal mood and affect  Assessment and Recommendations:  1. Oropharyngeal dysphagia. Schedule a modified barium swallow study.  2. GERD. Continue Nexium 40 mg twice a day and follow strict antireflux measures.  3. Irritable bowel syndrome. Continue dicyclomine bid when necessary.

## 2013-09-02 ENCOUNTER — Other Ambulatory Visit (HOSPITAL_COMMUNITY): Payer: Self-pay | Admitting: Gastroenterology

## 2013-09-02 DIAGNOSIS — R1310 Dysphagia, unspecified: Secondary | ICD-10-CM

## 2013-09-05 ENCOUNTER — Telehealth: Payer: Self-pay | Admitting: Family Medicine

## 2013-09-05 ENCOUNTER — Ambulatory Visit (HOSPITAL_COMMUNITY)
Admission: RE | Admit: 2013-09-05 | Discharge: 2013-09-05 | Disposition: A | Discharge status: Home / Self Care | Payer: Medicare Other | Source: Ambulatory Visit | Attending: Gastroenterology | Admitting: Gastroenterology

## 2013-09-05 DIAGNOSIS — E785 Hyperlipidemia, unspecified: Secondary | ICD-10-CM | POA: Insufficient documentation

## 2013-09-05 DIAGNOSIS — R1310 Dysphagia, unspecified: Secondary | ICD-10-CM

## 2013-09-05 DIAGNOSIS — R1319 Other dysphagia: Secondary | ICD-10-CM | POA: Insufficient documentation

## 2013-09-05 DIAGNOSIS — K219 Gastro-esophageal reflux disease without esophagitis: Secondary | ICD-10-CM | POA: Insufficient documentation

## 2013-09-05 NOTE — Telephone Encounter (Signed)
We have samples, pt advise

## 2013-09-05 NOTE — Telephone Encounter (Signed)
Pt is wanting to know if we have any nexium samples ??? she can have and she says if she does not answer please leave a detailedm essage on her answering machine.

## 2013-09-05 NOTE — Procedures (Signed)
Objective Swallowing Evaluation: Modified Barium Swallowing Study  Patient Details  Name: [name] MRN: [id number] Date of Birth: July 09, 1941  Today's Date: 09/05/2013 Time: 1125-1140 SLP Time Calculation (min): 15 min  Past Medical History:  Past Medical History  Diagnosis Date  . RA (rheumatoid arthritis)   . Allergic rhinitis   . Osteoarthritis   . Gastritis   . Hyperlipemia   . Former smoker   . Hemorrhoids   . Personal history of colonic polyps 1998    hyperplastic  . Esophageal reflux   . Fibula fracture 02/2005  . History of nephrolithiasis   . Shingles 2012  . Anxiety   . Heart murmur   . Diverticulosis    Past Surgical History:  Past Surgical History  Procedure Laterality Date  . Cholecystectomy    . Vaginal polyp excised  12/2000    benign  . Lipoma excision  11/2002  . Svt s/p surgery--? ablation    . Ep procedure  1994    SVT; normal echo  . Esophagogastroduodenoscopy  11/03    reactive gastropathy  . Colonoscopy  11/08    internal hemorrhoids  . Upper gastrointestinal endoscopy     HPI:  72 year old female seen for OP MBS due to c/o globus with liquids and solids. Patient reports a h/o cervical surgery approximately 15 years ago and esophageal dilation recently. Most recent Barium swallow 04/2012 indicated normal function.      Assessment / Plan / Recommendation Clinical Impression  Dysphagia Diagnosis: Suspected primary esophageal dysphagia Clinical impression: Patient presents with a suspected primary esophageal dysphagia characterized by what appears to be normal oropharyngeal function but mildly tight UES (? cervical hardware vs lower esophageal pressure with h/o GER as cause) resulting in trace vallecular residuals post swallow. Spontaneous dry swallowing successful to clear. Full airway protection noted. No SLP f/u indicated. Defer further f/u to MD.     Treatment Recommendation  No treatment recommended at this time    Diet Recommendation  Regular;Thin liquid   Liquid Administration via: Cup;Straw Medication Administration: Whole meds with liquid Supervision: Patient able to self feed Compensations: Slow rate;Small sips/bites Postural Changes and/or Swallow Maneuvers: Seated upright 90 degrees;Upright 30-60 min after meal    Other  Recommendations Oral Care Recommendations: Oral care BID   Follow Up Recommendations  None               General HPI: 72 year old female seen for OP MBS due to c/o globus with liquids and solids. Patient reports a h/o cervical surgery approximately 15 years ago and esophageal dilation recently. Most recent Barium swallow 04/2012 indicated normal function.  Type of Study: Modified Barium Swallowing Study Reason for Referral: Objectively evaluate swallowing function Previous Swallow Assessment: see HPI Diet Prior to this Study: Regular;Thin liquids Temperature Spikes Noted: No Respiratory Status: Room air History of Recent Intubation: No Behavior/Cognition: Alert;Cooperative;Pleasant mood Oral Cavity - Dentition: Adequate natural dentition Oral Motor / Sensory Function: Within functional limits Self-Feeding Abilities: Able to feed self Patient Positioning: Upright in chair Baseline Vocal Quality: Clear Volitional Cough: Strong Volitional Swallow: Able to elicit Anatomy: Other (Comment) (cervical hardware C5-7) Pharyngeal Secretions: Not observed secondary MBS    Reason for Referral Objectively evaluate swallowing function   Oral Phase Oral Preparation/Oral Phase Oral Phase: WFL   Pharyngeal Phase Pharyngeal Phase Pharyngeal Phase: Impaired Pharyngeal - Thin Pharyngeal - Thin Straw: Pharyngeal residue - valleculae Pharyngeal - Solids Pharyngeal - Puree: Pharyngeal residue - valleculae Pharyngeal - Mechanical  Soft: Pharyngeal residue - valleculae Pharyngeal - Pill: Within functional limits  Cervical Esophageal Phase    GO    Cervical Esophageal Phase Cervical Esophageal  Phase: Impaired Cervical Esophageal Phase - Thin Thin Straw: Reduced cricopharyngeal relaxation Cervical Esophageal Phase - Solids Puree: Reduced cricopharyngeal relaxation Mechanical Soft: Reduced cricopharyngeal relaxation Pill: Reduced cricopharyngeal relaxation Cervical Esophageal Phase - Comment Cervical Esophageal Comment: question due to cervical hardware vs sub UES pressure    Functional Assessment Tool Used: skilled clinical judgement Functional Limitations: Swallowing Swallow Current Status ([id number]): At least 1 percent but less than 20 percent impaired, limited or restricted Swallow Goal Status [phone]): At least 1 percent but less than 20 percent impaired, limited or restricted Swallow Discharge Status [phone]): At least 1 percent but less than 20 percent impaired, limited or restricted   [facility] MA, CCC-SLP [phone]  Ferdinand Lango Meryl 09/05/2013, 2:57 PM

## 2013-10-20 DIAGNOSIS — H269 Unspecified cataract: Secondary | ICD-10-CM

## 2013-10-20 HISTORY — DX: Unspecified cataract: H26.9

## 2013-12-20 ENCOUNTER — Other Ambulatory Visit: Payer: Self-pay | Admitting: Gastroenterology

## 2013-12-21 ENCOUNTER — Other Ambulatory Visit: Payer: Self-pay | Admitting: Family Medicine

## 2013-12-21 DIAGNOSIS — Z1231 Encounter for screening mammogram for malignant neoplasm of breast: Secondary | ICD-10-CM

## 2013-12-28 ENCOUNTER — Ambulatory Visit (HOSPITAL_COMMUNITY)
Admission: RE | Admit: 2013-12-28 | Discharge: 2013-12-28 | Disposition: A | Discharge status: Home / Self Care | Payer: Medicare Other | Source: Ambulatory Visit | Attending: Family Medicine | Admitting: Family Medicine

## 2013-12-28 DIAGNOSIS — Z1231 Encounter for screening mammogram for malignant neoplasm of breast: Secondary | ICD-10-CM | POA: Insufficient documentation

## 2014-01-26 ENCOUNTER — Encounter: Payer: Self-pay | Admitting: Internal Medicine

## 2014-01-26 ENCOUNTER — Ambulatory Visit (INDEPENDENT_AMBULATORY_CARE_PROVIDER_SITE_OTHER): Payer: Medicare Other | Admitting: Internal Medicine

## 2014-01-26 VITALS — BP 142/84 | HR 93 | Temp 98.0°F | Wt 202.0 lb

## 2014-01-26 DIAGNOSIS — J309 Allergic rhinitis, unspecified: Secondary | ICD-10-CM

## 2014-01-26 NOTE — Progress Notes (Signed)
HPI  Pt presents to the clinic today with c/o cough and chest congestion, headache, sneezing. The cough is productive of clear mucous. She denies fever, chills or body aches. She has tried tylenol, delsym and saline nasal spray with some relief. She does have a history of allergies. She has not had sick contacts.  Review of Systems    Past Medical History  Diagnosis Date  . RA (rheumatoid arthritis)   . Allergic rhinitis   . Osteoarthritis   . Gastritis   . Hyperlipemia   . Former smoker   . Hemorrhoids   . Personal history of colonic polyps 1998    hyperplastic  . Esophageal reflux   . Fibula fracture 02/2005  . History of nephrolithiasis   . Shingles 2012  . Anxiety   . Heart murmur   . Diverticulosis     Family History  Problem Relation Age of Onset  . Colon cancer Father 86  . Diabetes Mother   . Heart disease Mother   . Kidney disease Mother     ESRD  . Stroke Mother   . Hypertension Brother   . Breast cancer Sister   . Diabetes Other     nephews  . Colon cancer Sister   . Esophageal cancer Neg Hx   . Stomach cancer Neg Hx   . Rectal cancer Neg Hx     History   Social History  . Marital Status: Married    Spouse Name: N/A    Number of Children: 3  . Years of Education: N/A   Occupational History  . retired   .     Social History Main Topics  . Smoking status: Former Smoker    Quit date: 10/21/2003  . Smokeless tobacco: Never Used  . Alcohol Use: No  . Drug Use: No  . Sexual Activity: No   Other Topics Concern  . Not on file   Social History Narrative   Married      Retired      Occasional caffeine      No regular exercise    Allergies  Allergen Reactions  . Bee Venom Anaphylaxis  . Cetirizine Hcl     REACTION: does not work     Constitutional: Positive headache, fatigue. Denies fever or abrupt weight changes.  HEENT:  Positive nasal congestion and sore throat. Denies eye redness, ear pain, ringing in the ears, wax buildup,  runny nose or bloody nose. Respiratory: Positive cough. Denies difficulty breathing or shortness of breath.  Cardiovascular: Denies chest pain, chest tightness, palpitations or swelling in the hands or feet.   No other specific complaints in a complete review of systems (except as listed in HPI above).  Objective:    General: Appears her stated age, well developed, well nourished in NAD. HEENT: Head: normal shape and size, sinus tenderness noted; Eyes: sclera white, no icterus, conjunctiva pink, PERRLA and EOMs intact; Ears: Tm's gray and intact, normal light reflex; Nose: mucosa pink and moist, septum midline; Throat/Mouth: + PND. Teeth present, mucosa pink and moist, no exudate noted, no lesions or ulcerations noted.  Neck: Neck supple, trachea midline. No massses, lumps or thyromegaly present.  Cardiovascular: Normal rate and rhythm. S1,S2 noted.  No murmur, rubs or gallops noted. No JVD or BLE edema. No carotid bruits noted. Pulmonary/Chest: Normal effort and positive vesicular breath sounds. No respiratory distress. No wheezes, rales or ronchi noted.      Assessment & Plan:   Allergic Rhinitis:  Can use  a [employer] which can be purchased from your local drug store. Continue flonase daily Add allegra to your regimen  RTC as needed or if symptoms persist.

## 2014-01-26 NOTE — Patient Instructions (Addendum)
Allergic Rhinitis Allergic rhinitis is when the mucous membranes in the nose respond to allergens. Allergens are particles in the air that cause your body to have an allergic reaction. This causes you to release allergic antibodies. Through a chain of events, these eventually cause you to release histamine into the blood stream. Although meant to protect the body, it is this release of histamine that causes your discomfort, such as frequent sneezing, congestion, and an itchy, runny nose.  CAUSES  Seasonal allergic rhinitis (hay fever) is caused by pollen allergens that may come from grasses, trees, and weeds. Year-round allergic rhinitis (perennial allergic rhinitis) is caused by allergens such as house dust mites, pet dander, and mold spores.  SYMPTOMS   Nasal stuffiness (congestion).  Itchy, runny nose with sneezing and tearing of the eyes. DIAGNOSIS  Your health care provider can help you determine the allergen or allergens that trigger your symptoms. If you and your health care provider are unable to determine the allergen, skin or blood testing may be used. TREATMENT  Allergic Rhinitis does not have a cure, but it can be controlled by:  Medicines and allergy shots (immunotherapy).  Avoiding the allergen. Hay fever may often be treated with antihistamines in pill or nasal spray forms. Antihistamines block the effects of histamine. There are over-the-counter medicines that may help with nasal congestion and swelling around the eyes. Check with your health care provider before taking or giving this medicine.  If avoiding the allergen or the medicine prescribed do not work, there are many new medicines your health care provider can prescribe. Stronger medicine may be used if initial measures are ineffective. Desensitizing injections can be used if medicine and avoidance does not work. Desensitization is when a patient is given ongoing shots until the body becomes less sensitive to the allergen.  Make sure you follow up with your health care provider if problems continue. HOME CARE INSTRUCTIONS It is not possible to completely avoid allergens, but you can reduce your symptoms by taking steps to limit your exposure to them. It helps to know exactly what you are allergic to so that you can avoid your specific triggers. SEEK MEDICAL CARE IF:   You have a fever.  You develop a cough that does not stop easily (persistent).  You have shortness of breath.  You start wheezing.  Symptoms interfere with normal daily activities. Document Released: 07/01/2001 Document Revised: 07/27/2013 Document Reviewed: 06/13/2013 Exit[employer] Patient Information 2014 [employer].  

## 2014-01-26 NOTE — Progress Notes (Signed)
Pre visit review using our clinic review tool, if applicable. No additional management support is needed unless otherwise documented below in the visit note. 

## 2014-04-24 ENCOUNTER — Ambulatory Visit (INDEPENDENT_AMBULATORY_CARE_PROVIDER_SITE_OTHER): Payer: Medicare Other | Admitting: Family Medicine

## 2014-04-24 ENCOUNTER — Encounter: Payer: Self-pay | Admitting: Family Medicine

## 2014-04-24 VITALS — BP 148/82 | HR 83 | Temp 98.5°F | Ht 67.5 in | Wt 209.5 lb

## 2014-04-24 DIAGNOSIS — E7800 Pure hypercholesterolemia, unspecified: Secondary | ICD-10-CM

## 2014-04-24 DIAGNOSIS — Z0000 Encounter for general adult medical examination without abnormal findings: Secondary | ICD-10-CM

## 2014-04-24 DIAGNOSIS — Z23 Encounter for immunization: Secondary | ICD-10-CM

## 2014-04-24 DIAGNOSIS — R5381 Other malaise: Secondary | ICD-10-CM

## 2014-04-24 DIAGNOSIS — R5382 Chronic fatigue, unspecified: Secondary | ICD-10-CM

## 2014-04-24 DIAGNOSIS — E669 Obesity, unspecified: Secondary | ICD-10-CM

## 2014-04-24 DIAGNOSIS — R5383 Other fatigue: Secondary | ICD-10-CM

## 2014-04-24 NOTE — Assessment & Plan Note (Signed)
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

## 2014-04-24 NOTE — Progress Notes (Signed)
Pre visit review using our clinic review tool, if applicable. No additional management support is needed unless otherwise documented below in the visit note. 

## 2014-04-24 NOTE — Assessment & Plan Note (Signed)
Lipid panel today Rev low sat fat diet and goals for control

## 2014-04-24 NOTE — Assessment & Plan Note (Signed)
Discussed how this problem influences overall health and the risks it imposes  Reviewed plan for weight loss with lower calorie diet (via better food choices and also portion control or program like weight watchers) and exercise building up to or more than 30 minutes 5 days per week including some aerobic activity    

## 2014-04-24 NOTE — Assessment & Plan Note (Signed)
Ongoing - suspect secondary to RA/ medications/ inability to exercise/ chronic pain Lab today Disc water exercise as a good alternative

## 2014-04-24 NOTE — Patient Instructions (Addendum)
Pneumonia vaccine today I recommend you get a flu vaccine every fall Eat smart/eat less and consider water exercise to loose weight Labs for cholesterol and fatigue today Take care of yourself  If you are interested in a shingles/zoster vaccine - call your insurance to check on coverage,( you should not get it within 1 month of other vaccines) , then call us for a prescription  for it to take to a pharmacy that gives the shot , or make a nurse visit to get it here depending on your coverage

## 2014-04-24 NOTE — Progress Notes (Signed)
Subjective:    Patient ID: [name], female    DOB: 01/14/1941, 73 y.o.   MRN: [id number]  HPI Here for f/u of chronic medical problems and also wants to complete a medicare exam -to get a gift card from [facility] is up 7 lb with bmi of 32- she could work harder on healthy diet   Blood pressure BP Readings from Last 3 Encounters:  04/24/14 148/82  01/26/14 142/84  08/31/13 140/78    Saw GI for gerd/ dysphagia   Allergies - improved with otc tx prn   Has seen eye doctor for exam recently and doing well  Denies problems with hearing - could hear whisper at 15 ft today   She brought a 15$ gift card - if she does a medicare physical   I have personally reviewed the Medicare Annual Wellness questionnaire and have noted 1. The patient's medical and social history 2. Their use of alcohol, tobacco or illicit drugs 3. Their current medications and supplements 4. The patient's functional ability including ADL's, fall risks, home safety risks and hearing or visual             impairment. 5. Diet and physical activities 6. Evidence for depression or mood disorders  The patients weight, height, BMI have been recorded in the chart and visual acuity is per eye clinic.  I have made referrals, counseling and provided education to the patient based review of the above and I have provided the pt with a written personalized care plan for preventive services.  See scanned forms.  Routine anticipatory guidance given to patient.  See health maintenance. Colon cancer screening colonoscopy 3/14 - has 5 year recall  Breast cancer screening 3/15  Self breast exam- no lumps on self exam /has dense breasts -aware  Flu vaccine she declines  Tetanus vaccine 11/07  Pneumovax- has not had - will get that today  Zoster vaccine-has not had this - and has had shingles already   Advance directive- she does not have drawn up legally - was given a packet to work on that  Cognitive  function addressed- see scanned forms- and if abnormal then additional documentation follows. -no worries about memory   Still feels generally tired but not depressed ? Last tsh  No falls in the past year   PMH and SH reviewed  Meds, vitals, and allergies reviewed.   ROS: See HPI.  Otherwise negative.    Has not had any cholesterol screening in a long time  Her rheumatologist does regular labs for her methotrexate   Allergy symptoms are a bit better  She used to take shots  Sinus surgery was recommended for her   Patient Active Problem List   Diagnosis Date Noted  . Dysphagia 08/10/2013  . Plantar fasciitis of right foot 02/23/2013  . Right foot pain 02/23/2013  . Left shoulder pain 07/12/2012  . Post herpetic neuralgia 07/09/2011  . Elevated blood pressure 07/09/2011  . Low back pain 07/09/2011  . Left sided abdominal pain 06/12/2011  . COMEDO 11/09/2009  . ARTHRITIS, RHEUMATOID 07/23/2007  . GERD 07/09/2007  . HYPERCHOLESTEROLEMIA 03/19/2007  . PSVT 03/19/2007  . ALLERGIC RHINITIS 03/19/2007  . ENDOMETRIAL POLYP 03/19/2007  . POSTMENOPAUSAL STATUS 03/19/2007  . OSTEOARTHRITIS 03/19/2007  . SPINAL STENOSIS 03/19/2007  . MURMUR 03/19/2007  . NEPHROLITHIASIS, HX OF 03/19/2007   Past Medical History  Diagnosis Date  . RA (rheumatoid arthritis)   . Allergic rhinitis   .  Osteoarthritis   . Gastritis   . Hyperlipemia   . Former smoker   . Hemorrhoids   . Personal history of colonic polyps 1998    hyperplastic  . Esophageal reflux   . Fibula fracture 02/2005  . History of nephrolithiasis   . Shingles 2012  . Anxiety   . Heart murmur   . Diverticulosis    Past Surgical History  Procedure Laterality Date  . Cholecystectomy    . Vaginal polyp excised  12/2000    benign  . Lipoma excision  11/2002  . Svt s/p surgery--? ablation    . Ep procedure  1994    SVT; normal echo  . Esophagogastroduodenoscopy  11/03    reactive gastropathy  . Colonoscopy  11/08     internal hemorrhoids  . Upper gastrointestinal endoscopy     History  Substance Use Topics  . Smoking status: Former Smoker    Quit date: 10/21/2003  . Smokeless tobacco: Never Used  . Alcohol Use: No   Family History  Problem Relation Age of Onset  . Colon cancer Father 85  . Diabetes Mother   . Heart disease Mother   . Kidney disease Mother     ESRD  . Stroke Mother   . Hypertension Brother   . Breast cancer Sister   . Diabetes Other     nephews  . Colon cancer Sister   . Esophageal cancer Neg Hx   . Stomach cancer Neg Hx   . Rectal cancer Neg Hx    Allergies  Allergen Reactions  . Bee Venom Anaphylaxis  . Cetirizine Hcl     REACTION: does not work   Current Outpatient Prescriptions on File Prior to Visit  Medication Sig Dispense Refill  . acetaminophen (TYLENOL) 325 MG tablet Take 650 mg by mouth as needed.        Marland Kitchen albuterol (PROAIR HFA) 108 (90 BASE) MCG/ACT inhaler Inhale 2 puffs into the lungs every 4 (four) hours as needed.        . ALPRAZolam (XANAX) 0.25 MG tablet Take 1 tablet (0.25 mg total) by mouth daily as needed for anxiety (take only if needed ).  30 tablet  0  . Azelastine HCl (ASTEPRO) 0.15 % SOLN Use 2 sprays two times a day as needed       . Calcium & Magnesium Carbonates (MYLANTA PO) Take 1 capsule by mouth as directed.        . dicyclomine (BENTYL) 10 MG capsule TAKE 1 CAPSULE (10 MG TOTAL) BY MOUTH 2 (TWO) TIMES DAILY AS NEEDED (SPASMS AND CRAMPING).  60 capsule  5  . EPINEPHrine (EPIPEN IJ) Inject as directed as needed.        Marland Kitchen esomeprazole (NEXIUM) 40 MG capsule TAKE 1 CAPSULE BY MOUTH TWICE DAILY  60 capsule  5  . fluticasone (FLONASE) 50 MCG/ACT nasal spray Place 2 sprays into the nose daily.  16 g  6  . gabapentin (NEURONTIN) 300 MG capsule Take 300 mg by mouth 2 (two) times daily.      Marland Kitchen loratadine (CLARITIN) 10 MG tablet Take 10 mg by mouth daily.      . methotrexate (RHEUMATREX) 2.5 MG tablet Take 7.5 mg by mouth once a week.         No  current facility-administered medications on file prior to visit.     Review of Systems Review of Systems  Constitutional: Negative for fever, appetite change,  and unexpected weight change. pos for fatigue  Eyes: Negative for pain and visual disturbance.  ENT neg for hearing loss  Respiratory: Negative for cough and shortness of breath.   Cardiovascular: Negative for cp or palpitations    Gastrointestinal: Negative for nausea, diarrhea and constipation.  Genitourinary: Negative for urgency and frequency.  Skin: Negative for pallor or rash   Neurological: Negative for weakness, light-headedness, numbness and headaches. neg for falls  MSK pos for arthritis pain - RA and OA that limit her exercise  Hematological: Negative for adenopathy. Does not bruise/bleed easily.  Psychiatric/Behavioral: Negative for dysphoric mood. The patient is not nervous/anxious.         Objective:   Physical Exam  Constitutional: She appears well-developed and well-nourished. No distress.  obese and well appearing   HENT:  Head: Normocephalic and atraumatic.  Right Ear: External ear normal.  Left Ear: External ear normal.  Nose: Nose normal.  Mouth/Throat: Oropharynx is clear and moist.  Boggy nares Ear canals are clear   Eyes: Conjunctivae and EOM are normal. Pupils are equal, round, and reactive to light. No scleral icterus.  Neck: Neck supple. No JVD present. Carotid bruit is not present. No thyromegaly present.  Cardiovascular: Normal rate, regular rhythm and intact distal pulses.   Murmur heard. LE varicosities noted   Pulmonary/Chest: Effort normal and breath sounds normal. No respiratory distress. She has no wheezes. She has no rales.  Abdominal: Soft. Bowel sounds are normal. She exhibits no distension, no abdominal bruit and no mass. There is no tenderness.  Musculoskeletal: She exhibits no edema.  Poor rom of spine noted   Lymphadenopathy:    She has no cervical adenopathy.  Neurological:  She is alert. She has normal reflexes. No cranial nerve deficit. She exhibits normal muscle tone. Coordination normal.  Skin: Skin is warm and dry. No rash noted. No erythema. No pallor.  Psychiatric: She has a normal mood and affect.          Assessment & Plan:   Problem List Items Addressed This Visit     Other   HYPERCHOLESTEROLEMIA     Lipid panel today Rev low sat fat diet and goals for control     Relevant Orders      Lipid panel   Fatigue     Ongoing - suspect secondary to RA/ medications/ inability to exercise/ chronic pain Lab today Disc water exercise as a good alternative     Relevant Orders      TSH      Vitamin B12      Lipid panel   Obesity     Discussed how this problem influences overall health and the risks it imposes  Reviewed plan for weight loss with lower calorie diet (via better food choices and also portion control or program like weight watchers) and exercise building up to or more than 30 minutes 5 days per week including some aerobic activity       Encounter for Medicare annual wellness exam - Primary     Reviewed health habits including diet and exercise and skin cancer prevention Reviewed appropriate screening tests for age  Also reviewed health mt list, fam hx and immunization status , as well as social and family history   See HPI 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

## 2014-04-25 NOTE — Addendum Note (Signed)
Addended by: Marchia Bond on: 04/25/2014 04:57 PM   Modules accepted: Orders

## 2014-04-26 ENCOUNTER — Other Ambulatory Visit (INDEPENDENT_AMBULATORY_CARE_PROVIDER_SITE_OTHER): Payer: Medicare Other

## 2014-04-26 DIAGNOSIS — R5383 Other fatigue: Secondary | ICD-10-CM

## 2014-04-26 DIAGNOSIS — R5381 Other malaise: Secondary | ICD-10-CM

## 2014-04-26 DIAGNOSIS — R5382 Chronic fatigue, unspecified: Secondary | ICD-10-CM

## 2014-04-26 DIAGNOSIS — E7800 Pure hypercholesterolemia, unspecified: Secondary | ICD-10-CM

## 2014-04-26 LAB — LIPID PANEL
Cholesterol: 182 mg/dL (ref 0–200)
HDL: 49 mg/dL (ref >39.00)
LDL Cholesterol: 90 mg/dL (ref 0–99)
NonHDL: 133
TRIGLYCERIDES: 215 mg/dL — AB (ref 0.0–149.0)
Total CHOL/HDL Ratio: 4
VLDL: 43 mg/dL — ABNORMAL HIGH (ref 0.0–40.0)

## 2014-04-26 LAB — TSH: TSH: 0.26 u[IU]/mL — AB (ref 0.35–4.50)

## 2014-04-26 LAB — VITAMIN B12: VITAMIN B 12: 275 pg/mL (ref 211–911)

## 2014-04-27 ENCOUNTER — Encounter: Payer: Self-pay | Admitting: *Deleted

## 2014-04-27 ENCOUNTER — Ambulatory Visit: Payer: Medicare Other

## 2014-04-27 DIAGNOSIS — E039 Hypothyroidism, unspecified: Secondary | ICD-10-CM

## 2014-04-27 LAB — T4, FREE: Free T4: 0.77 ng/dL (ref 0.60–1.60)

## 2014-05-10 DIAGNOSIS — M1990 Unspecified osteoarthritis, unspecified site: Secondary | ICD-10-CM | POA: Insufficient documentation

## 2014-06-03 ENCOUNTER — Other Ambulatory Visit: Payer: Self-pay | Admitting: Family Medicine

## 2014-08-12 ENCOUNTER — Other Ambulatory Visit: Payer: Self-pay | Admitting: Gastroenterology

## 2014-08-16 ENCOUNTER — Telehealth: Payer: Self-pay | Admitting: Gastroenterology

## 2014-08-16 NOTE — Telephone Encounter (Signed)
Advised patient that per pharmacy, she may be in the doughnut hole. Her insurance does typically cover Nexium but it is now $200 + for 1 month. I asked patient to call her insurance company to see if she is in the doughnut hole. She states that she got a letter from [facility] that they did not want her to take Nexium any longer. I again advised her to call her insurance to find out which PPI medication they would prefer. She will come for office visit with Dr Fuller Plan for yearly GERD follow up on 08/28/14 and will give [place] the information at that time.

## 2014-08-28 ENCOUNTER — Ambulatory Visit (INDEPENDENT_AMBULATORY_CARE_PROVIDER_SITE_OTHER): Payer: Medicare Other | Admitting: Gastroenterology

## 2014-08-28 ENCOUNTER — Encounter: Payer: Self-pay | Admitting: Gastroenterology

## 2014-08-28 VITALS — BP 142/78 | HR 68 | Ht 67.0 in | Wt 211.8 lb

## 2014-08-28 DIAGNOSIS — Z800 Family history of malignant neoplasm of digestive organs: Secondary | ICD-10-CM

## 2014-08-28 DIAGNOSIS — K219 Gastro-esophageal reflux disease without esophagitis: Secondary | ICD-10-CM

## 2014-08-28 DIAGNOSIS — R0782 Intercostal pain: Secondary | ICD-10-CM

## 2014-08-28 NOTE — Patient Instructions (Addendum)
We will send in your generic prescription of Nexium to your pharmacy in January call and give [place] a reminder call when you are ready for [place] to send it

## 2014-08-28 NOTE — Progress Notes (Signed)
    History of Present Illness: This is a 73 year old female returning for follow-up of GERD. She has a letter from her insurance company wanting her to switch from Nexium to esomeprazole in January. She relates occasional crampy pain along her left anterior and lateral chest associated with bending.  Current Medications, Allergies, Past Medical History, Past Surgical History, Family History and Social History were reviewed in [employer] record.  Physical Exam: General: Well developed , well nourished, no acute distress Head: Normocephalic and atraumatic Eyes:  sclerae anicteric, EOMI Ears: Normal auditory acuity Mouth: No deformity or lesions Lungs: Clear throughout to auscultation Heart: Regular rate and rhythm; no murmurs, rubs or bruits Abdomen: Soft, non tender and non distended. No masses, hepatosplenomegaly or hernias noted. Normal Bowel sounds Musculoskeletal: Symmetrical with no gross deformities  Pulses:  Normal pulses noted Extremities: No clubbing, cyanosis, edema or deformities noted Neurological: Alert oriented x 4, grossly nonfocal Psychological:  Alert and cooperative. Normal mood and affect  Assessment and Recommendations:  1. GERD. Continue Nexium 40 mg daily. Change to esomeprazole 40 mg daily beginning in January.  2. Family history of colon cancer. Surveillance colonoscopy recommended at a 5 year interval in March 2019.  3. Left anterior and lateral chest pain with movement, presumed musculoskeletal etiology. Further follow-up with her PCP.

## 2014-11-06 DIAGNOSIS — M5416 Radiculopathy, lumbar region: Secondary | ICD-10-CM | POA: Diagnosis not present

## 2014-11-06 DIAGNOSIS — M47817 Spondylosis without myelopathy or radiculopathy, lumbosacral region: Secondary | ICD-10-CM | POA: Diagnosis not present

## 2014-11-06 DIAGNOSIS — M5116 Intervertebral disc disorders with radiculopathy, lumbar region: Secondary | ICD-10-CM | POA: Diagnosis not present

## 2014-11-06 DIAGNOSIS — M545 Low back pain: Secondary | ICD-10-CM | POA: Diagnosis not present

## 2014-12-03 ENCOUNTER — Other Ambulatory Visit: Payer: Self-pay | Admitting: Gastroenterology

## 2014-12-14 ENCOUNTER — Other Ambulatory Visit: Payer: Self-pay | Admitting: Gastroenterology

## 2014-12-14 DIAGNOSIS — Z961 Presence of intraocular lens: Secondary | ICD-10-CM | POA: Diagnosis not present

## 2014-12-25 ENCOUNTER — Ambulatory Visit (INDEPENDENT_AMBULATORY_CARE_PROVIDER_SITE_OTHER)
Admission: RE | Admit: 2014-12-25 | Discharge: 2014-12-25 | Disposition: A | Discharge status: Home / Self Care | Payer: Medicare Other | Source: Ambulatory Visit | Attending: Family Medicine | Admitting: Family Medicine

## 2014-12-25 ENCOUNTER — Ambulatory Visit (INDEPENDENT_AMBULATORY_CARE_PROVIDER_SITE_OTHER): Payer: Medicare Other | Admitting: Family Medicine

## 2014-12-25 ENCOUNTER — Encounter: Payer: Self-pay | Admitting: Family Medicine

## 2014-12-25 VITALS — BP 142/90 | HR 77 | Temp 98.1°F | Ht 67.0 in | Wt 216.8 lb

## 2014-12-25 DIAGNOSIS — M25512 Pain in left shoulder: Secondary | ICD-10-CM | POA: Diagnosis not present

## 2014-12-25 DIAGNOSIS — R0789 Other chest pain: Secondary | ICD-10-CM | POA: Diagnosis not present

## 2014-12-25 DIAGNOSIS — M19012 Primary osteoarthritis, left shoulder: Secondary | ICD-10-CM | POA: Diagnosis not present

## 2014-12-25 DIAGNOSIS — M069 Rheumatoid arthritis, unspecified: Secondary | ICD-10-CM

## 2014-12-25 NOTE — Progress Notes (Signed)
Pre visit review using our clinic review tool, if applicable. No additional management support is needed unless otherwise documented below in the visit note. 

## 2014-12-25 NOTE — Patient Instructions (Signed)
Xray of shoulder and chest wall today- we will call you about that  Use alternate heat and ice on shoulder (10 minutes)  Keep walking for joint health You should also get back to your rheumatologist

## 2014-12-25 NOTE — Assessment & Plan Note (Signed)
Over shoulder and also deltoid area  Tender diffusely-no swelling  Pos hawking and neer tests  Xray today Known RA as well-needs to f/u with her rheumatologist also

## 2014-12-25 NOTE — Assessment & Plan Note (Signed)
Adv to return to rheumatologist to come up with a game plan for tx Not on MTX now

## 2014-12-25 NOTE — Progress Notes (Signed)
Subjective:    Patient ID: [name], female    DOB: [birth year], 74 y.o.   MRN: [id number]  HPI Here with pain in L arm /shoulder and side pain   Did have surgery on L shoulder - 10 years ago - she had a "fatty tumor" in that area   Pain started 1 week or more ago  Pain is worst when just sitting and leaving arm hanging down Pain radiates from lateral ribs up into arm and shoulder  No rash  Some stiffness- points to shoulder and also deltoid area     RA- due to go back  Not on methotrexate  Does not know if she wants to treat it   Also seeing Dr Ernestina Patches for shots in her back  Has OA in her back  LS  Is helping   Patient Active Problem List   Diagnosis Date Noted  . Left shoulder pain 12/25/2014  . Left-sided chest wall pain 12/25/2014  . Fatigue 04/24/2014  . Obesity 04/24/2014  . Encounter for Medicare annual wellness exam 04/24/2014  . Post herpetic neuralgia 07/09/2011  . Elevated blood pressure 07/09/2011  . Low back pain 07/09/2011  . Left sided abdominal pain 06/12/2011  . Rheumatoid arthritis 07/23/2007  . GERD 07/09/2007  . HYPERCHOLESTEROLEMIA 03/19/2007  . PSVT 03/19/2007  . ALLERGIC RHINITIS 03/19/2007  . ENDOMETRIAL POLYP 03/19/2007  . POSTMENOPAUSAL STATUS 03/19/2007  . OSTEOARTHRITIS 03/19/2007  . SPINAL STENOSIS 03/19/2007  . MURMUR 03/19/2007  . NEPHROLITHIASIS, HX OF 03/19/2007   Past Medical History  Diagnosis Date  . RA (rheumatoid arthritis)   . Allergic rhinitis   . Osteoarthritis   . Gastritis   . Hyperlipemia   . Former smoker   . Hemorrhoids   . Personal history of colonic polyps 1998    hyperplastic  . Esophageal reflux   . Fibula fracture 02/2005  . History of nephrolithiasis   . Shingles 2012  . Anxiety   . Heart murmur   . Diverticulosis    Past Surgical History  Procedure Laterality Date  . Cholecystectomy    . Vaginal polyp excised  12/2000    benign  . Lipoma excision  11/2002  . Svt s/p surgery--? ablation     . Ep procedure  1994    SVT; normal echo  . Esophagogastroduodenoscopy  11/03    reactive gastropathy  . Colonoscopy  11/08    internal hemorrhoids  . Upper gastrointestinal endoscopy     History  Substance Use Topics  . Smoking status: Former Smoker    Quit date: 10/21/2003  . Smokeless tobacco: Never Used  . Alcohol Use: No   Family History  Problem Relation Age of Onset  . Colon cancer Father 42  . Diabetes Mother   . Heart disease Mother   . Kidney disease Mother     ESRD  . Stroke Mother   . Hypertension Brother   . Breast cancer Sister   . Diabetes Other     nephews  . Colon cancer Sister   . Esophageal cancer Neg Hx   . Stomach cancer Neg Hx   . Rectal cancer Neg Hx    Allergies  Allergen Reactions  . Bee Venom Anaphylaxis  . Cetirizine Hcl     REACTION: does not work   Current Outpatient Prescriptions on File Prior to Visit  Medication Sig Dispense Refill  . acetaminophen (TYLENOL) 325 MG tablet Take 650 mg by mouth as needed.      Marland Kitchen  albuterol (PROAIR HFA) 108 (90 BASE) MCG/ACT inhaler Inhale 2 puffs into the lungs every 4 (four) hours as needed.      . ALPRAZolam (XANAX) 0.25 MG tablet Take 1 tablet (0.25 mg total) by mouth daily as needed for anxiety (take only if needed ). 30 tablet 0  . Azelastine HCl (ASTEPRO) 0.15 % SOLN Use 2 sprays two times a day as needed     . Calcium & Magnesium Carbonates (MYLANTA PO) Take 1 capsule by mouth as directed.      . dicyclomine (BENTYL) 10 MG capsule TAKE 1 CAPSULE (10 MG TOTAL) BY MOUTH 2 (TWO) TIMES DAILY AS NEEDED (SPASMS AND CRAMPING). 60 capsule 5  . EPINEPHrine (EPIPEN IJ) Inject as directed as needed.      . fluticasone (FLONASE) 50 MCG/ACT nasal spray PLACE 2 SPRAYS INTO THE NOSE DAILY. 16 g 3  . gabapentin (NEURONTIN) 300 MG capsule Take 300 mg by mouth 2 (two) times daily.    Marland Kitchen loratadine (CLARITIN) 10 MG tablet Take 10 mg by mouth daily.    Marland Kitchen NEXIUM 40 MG capsule TAKE 1 CAPSULE BY MOUTH TWICE DAILY 60  capsule 5   No current facility-administered medications on file prior to visit.    Review of Systems Review of Systems  Constitutional: Negative for fever, appetite change, fatigue and unexpected weight change.  Eyes: Negative for pain and visual disturbance.  Respiratory: Negative for cough and shortness of breath.   Cardiovascular: Negative for cp or palpitations    Gastrointestinal: Negative for nausea, diarrhea and constipation.  Genitourinary: Negative for urgency and frequency.  Skin: Negative for pallor or rash   MSK pos for L shoulder and chest wall pain  Neurological: Negative for weakness, light-headedness, numbness and headaches.  Hematological: Negative for adenopathy. Does not bruise/bleed easily.  Psychiatric/Behavioral: Negative for dysphoric mood. The patient is not nervous/anxious.         Objective:   Physical Exam  Constitutional: She appears well-developed and well-nourished. No distress.  obese and well appearing   HENT:  Head: Normocephalic and atraumatic.  Eyes: Conjunctivae and EOM are normal. Pupils are equal, round, and reactive to light.  Neck: Normal range of motion. Neck supple.  Cardiovascular: Normal rate and regular rhythm.   Pulmonary/Chest: Effort normal and breath sounds normal. No respiratory distress. She has no wheezes. She has no rales. She exhibits tenderness.  L anterolateral cw tenderness without crepitus or skin change   Musculoskeletal: She exhibits tenderness. She exhibits no edema.       Left shoulder: She exhibits decreased range of motion, tenderness and bony tenderness. She exhibits no swelling, no effusion, no crepitus and normal pulse.  L shoulder -tender over acromion and also bicep tendon   Pos hawking and neer tests for pain  Apprehensive to abduct arm fully  Pain with int and ext rotation     Lymphadenopathy:    She has no cervical adenopathy.  Neurological: She is alert. She has normal reflexes. She displays no  atrophy. No cranial nerve deficit or sensory deficit. She exhibits normal muscle tone. Coordination normal.  Skin: Skin is warm and dry. No rash noted. No erythema. No pallor.  Psychiatric: She has a normal mood and affect.          Assessment & Plan:   Problem List Items Addressed This Visit      Musculoskeletal and Integument   Rheumatoid arthritis    Adv to return to rheumatologist to come up with a game  plan for tx Not on MTX now        Other   Left shoulder pain - Primary    Over shoulder and also deltoid area  Tender diffusely-no swelling  Pos hawking and neer tests  Xray today Known RA as well-needs to f/u with her rheumatologist also       Relevant Orders   DG Shoulder Left (Completed)   Left-sided chest wall pain    For 1 week  Suspect strain Tender without crepitus  xr rib today      Relevant Orders   DG Ribs Unilateral Left (Completed)

## 2014-12-25 NOTE — Assessment & Plan Note (Signed)
For 1 week  Suspect strain Tender without crepitus  xr rib today

## 2014-12-26 ENCOUNTER — Telehealth: Payer: Self-pay

## 2014-12-26 NOTE — Telephone Encounter (Signed)
Patient aware of xray results and recommendations.

## 2014-12-26 NOTE — Telephone Encounter (Signed)
-----   Message from Abner Greenspan, MD sent at 12/25/2014  6:23 PM EST ----- No rib fractures  There are mild degenerative changes of the shoulder joint This may be tendonitis of the shoulder  I recommend she follows up with her rheumatologist for this  Use warm and cold compresses intermittently and avoid heavy lifting on that side

## 2015-02-20 ENCOUNTER — Other Ambulatory Visit: Payer: Self-pay

## 2015-02-20 MED ORDER — DICYCLOMINE HCL 10 MG PO CAPS: ORAL_CAPSULE | ORAL | Status: DC

## 2015-02-23 DIAGNOSIS — I00 Rheumatic fever without heart involvement: Secondary | ICD-10-CM | POA: Diagnosis not present

## 2015-03-26 ENCOUNTER — Encounter: Payer: Self-pay | Admitting: Primary Care

## 2015-03-26 ENCOUNTER — Ambulatory Visit (INDEPENDENT_AMBULATORY_CARE_PROVIDER_SITE_OTHER): Payer: Medicare Other | Admitting: Primary Care

## 2015-03-26 VITALS — BP 148/80 | HR 85 | Temp 98.6°F | Ht 67.0 in | Wt 211.0 lb

## 2015-03-26 DIAGNOSIS — R05 Cough: Secondary | ICD-10-CM | POA: Diagnosis not present

## 2015-03-26 DIAGNOSIS — R059 Cough, unspecified: Secondary | ICD-10-CM

## 2015-03-26 MED ORDER — BENZONATATE 200 MG PO CAPS: 200.0000 mg | ORAL_CAPSULE | Freq: Three times a day (TID) | ORAL | Status: DC | PRN

## 2015-03-26 MED ORDER — AZITHROMYCIN 250 MG PO TABS: ORAL_TABLET | ORAL | Status: DC

## 2015-03-26 NOTE — Progress Notes (Signed)
Pre visit review using our clinic review tool, if applicable. No additional management support is needed unless otherwise documented below in the visit note. 

## 2015-03-26 NOTE — Patient Instructions (Signed)
Start Azithromycin antibiotics. Take 2 tablet by mouth today, then take 1 tablet by mouth daily for 4 days. Start Benzonatate capsules. Take 1 capsule three times daily as needed for cough. Call me if no improvement in the next 3-4 days or if you develop shortness of breath. It was nice meeting you!

## 2015-03-26 NOTE — Progress Notes (Signed)
Subjective:    Patient ID: [name], female    DOB: [birth year], 74 y.o.   MRN: [id number]  HPI  [name] is a 74 year old female who presents today with a chief complaint of cough, sore throat, fatigue, and ear pain. Her symptoms began 6 days ago. Her cough is productive with yellow sputum. She's been taking Delsym with temporary relief. Overall she's feeling worse as her cough has not dissipated.  Review of Systems  Constitutional: Negative for fever and chills.  HENT: Positive for ear pain, sinus pressure and sore throat. Negative for congestion and rhinorrhea.   Respiratory: Positive for cough. Negative for shortness of breath.   Cardiovascular: Negative for chest pain.  Gastrointestinal: Negative for nausea and vomiting.  Musculoskeletal: Negative for myalgias.       Past Medical History  Diagnosis Date  . RA (rheumatoid arthritis)   . Allergic rhinitis   . Osteoarthritis   . Gastritis   . Hyperlipemia   . Former smoker   . Hemorrhoids   . Personal history of colonic polyps 1998    hyperplastic  . Esophageal reflux   . Fibula fracture 02/2005  . History of nephrolithiasis   . Shingles 2012  . Anxiety   . Heart murmur   . Diverticulosis     History   Social History  . Marital Status: Married    Spouse Name: N/A  . Number of Children: 3  . Years of Education: N/A   Occupational History  . retired   .     Social History Main Topics  . Smoking status: Former Smoker    Quit date: 10/21/2003  . Smokeless tobacco: Never Used  . Alcohol Use: No  . Drug Use: No  . Sexual Activity: No   Other Topics Concern  . Not on file   Social History Narrative   Married      Retired      Occasional caffeine      No regular exercise    Past Surgical History  Procedure Laterality Date  . Cholecystectomy    . Vaginal polyp excised  12/2000    benign  . Lipoma excision  11/2002  . Svt s/p surgery--? ablation    . Ep procedure  1994    SVT; normal echo    . Esophagogastroduodenoscopy  11/03    reactive gastropathy  . Colonoscopy  11/08    internal hemorrhoids  . Upper gastrointestinal endoscopy      Family History  Problem Relation Age of Onset  . Colon cancer Father 28  . Diabetes Mother   . Heart disease Mother   . Kidney disease Mother     ESRD  . Stroke Mother   . Hypertension Brother   . Breast cancer Sister   . Diabetes Other     nephews  . Colon cancer Sister   . Esophageal cancer Neg Hx   . Stomach cancer Neg Hx   . Rectal cancer Neg Hx     Allergies  Allergen Reactions  . Bee Venom Anaphylaxis  . Cetirizine Hcl     REACTION: does not work    Current Outpatient Prescriptions on File Prior to Visit  Medication Sig Dispense Refill  . acetaminophen (TYLENOL) 325 MG tablet Take 650 mg by mouth as needed.      Marland Kitchen albuterol (PROAIR HFA) 108 (90 BASE) MCG/ACT inhaler Inhale 2 puffs into the lungs every 4 (four) hours as needed.      Marland Kitchen  ALPRAZolam (XANAX) 0.25 MG tablet Take 1 tablet (0.25 mg total) by mouth daily as needed for anxiety (take only if needed ). 30 tablet 0  . Azelastine HCl (ASTEPRO) 0.15 % SOLN Use 2 sprays two times a day as needed     . Calcium & Magnesium Carbonates (MYLANTA PO) Take 1 capsule by mouth as directed.      . dicyclomine (BENTYL) 10 MG capsule TAKE 1 CAPSULE (10 MG TOTAL) BY MOUTH 2 (TWO) TIMES DAILY AS NEEDED (SPASMS AND CRAMPING). 180 capsule 1  . EPINEPHrine (EPIPEN IJ) Inject as directed as needed.      . fluticasone (FLONASE) 50 MCG/ACT nasal spray PLACE 2 SPRAYS INTO THE NOSE DAILY. 16 g 3  . gabapentin (NEURONTIN) 300 MG capsule Take 300 mg by mouth 2 (two) times daily.    Marland Kitchen loratadine (CLARITIN) 10 MG tablet Take 10 mg by mouth daily.    Marland Kitchen NEXIUM 40 MG capsule TAKE 1 CAPSULE BY MOUTH TWICE DAILY 60 capsule 5   No current facility-administered medications on file prior to visit.    BP 148/80 mmHg  Pulse 85  Temp(Src) 98.6 F (37 C) (Oral)  Ht 5\' 7"  (1.702 m)  Wt 211 lb  (95.709 kg)  BMI 33.04 kg/m2  SpO2 94%    Objective:   Physical Exam  Constitutional: She appears well-nourished. She does not appear ill.  HENT:  Right Ear: Tympanic membrane and ear canal normal.  Left Ear: Tympanic membrane and ear canal normal.  Nose: Right sinus exhibits maxillary sinus tenderness. Left sinus exhibits maxillary sinus tenderness.  Eyes: Conjunctivae are normal. Pupils are equal, round, and reactive to light.  Neck: Neck supple.  Pulmonary/Chest: Effort normal and breath sounds normal.  Lymphadenopathy:    She has no cervical adenopathy.  Skin: Skin is warm and dry.          Assessment & Plan:  Cough:  Suspect this may be bacterial in nature due to duration and worsening of symptoms. Lung clear, no fevers, do no suspect pneumonia. RX for Zpak and Tessalon Pearls. Push fluids, rest. Follow up if no improvement in 3-4 days.

## 2015-04-02 ENCOUNTER — Ambulatory Visit (INDEPENDENT_AMBULATORY_CARE_PROVIDER_SITE_OTHER): Payer: Medicare Other | Admitting: Family Medicine

## 2015-04-02 ENCOUNTER — Encounter: Payer: Self-pay | Admitting: Family Medicine

## 2015-04-02 VITALS — BP 142/70 | HR 77 | Temp 98.1°F | Ht 67.0 in | Wt 210.0 lb

## 2015-04-02 DIAGNOSIS — J069 Acute upper respiratory infection, unspecified: Secondary | ICD-10-CM | POA: Diagnosis not present

## 2015-04-02 NOTE — Patient Instructions (Signed)
I think you are in the middle of a viral upper respiratory infection  It will take longer to get better  The antibiotic stays in your system 10-14 days  Use cough medicine as needed  Check temp and let me know if you have a fever  Update me if sinus pain increases and green/thicker nasal drainage

## 2015-04-02 NOTE — Assessment & Plan Note (Signed)
Slowly improving in pt with RA Reassuring exam Watch for s/s of sinusitis  Finished augmentin Update if not starting to improve in a week or if worsening

## 2015-04-02 NOTE — Progress Notes (Signed)
Subjective:    Patient ID: [name], female    DOB: [birth year], 74 y.o.   MRN: [id number]  HPI Here for f/u of uri   Seen on 6/6   Took azithromycin - finished that  Still hoarse -not improving   Sweating -off and on all day and esp at night  Not checking temp   No fever now   Cough - is not as bad - improved  No sob  Not wheezing   Throat is sore -improved from what it was   R ear hurts / noise in it   Patient Active Problem List   Diagnosis Date Noted  . Left shoulder pain 12/25/2014  . Left-sided chest wall pain 12/25/2014  . Fatigue 04/24/2014  . Obesity 04/24/2014  . Encounter for Medicare annual wellness exam 04/24/2014  . Post herpetic neuralgia 07/09/2011  . Elevated blood pressure 07/09/2011  . Low back pain 07/09/2011  . Left sided abdominal pain 06/12/2011  . Rheumatoid arthritis 07/23/2007  . GERD 07/09/2007  . HYPERCHOLESTEROLEMIA 03/19/2007  . PSVT 03/19/2007  . ALLERGIC RHINITIS 03/19/2007  . ENDOMETRIAL POLYP 03/19/2007  . POSTMENOPAUSAL STATUS 03/19/2007  . OSTEOARTHRITIS 03/19/2007  . SPINAL STENOSIS 03/19/2007  . MURMUR 03/19/2007  . NEPHROLITHIASIS, HX OF 03/19/2007   Past Medical History  Diagnosis Date  . RA (rheumatoid arthritis)   . Allergic rhinitis   . Osteoarthritis   . Gastritis   . Hyperlipemia   . Former smoker   . Hemorrhoids   . Personal history of colonic polyps 1998    hyperplastic  . Esophageal reflux   . Fibula fracture 02/2005  . History of nephrolithiasis   . Shingles 2012  . Anxiety   . Heart murmur   . Diverticulosis    Past Surgical History  Procedure Laterality Date  . Cholecystectomy    . Vaginal polyp excised  12/2000    benign  . Lipoma excision  11/2002  . Svt s/p surgery--? ablation    . Ep procedure  1994    SVT; normal echo  . Esophagogastroduodenoscopy  11/03    reactive gastropathy  . Colonoscopy  11/08    internal hemorrhoids  . Upper gastrointestinal endoscopy     History    Substance Use Topics  . Smoking status: Former Smoker    Quit date: 10/21/2003  . Smokeless tobacco: Never Used  . Alcohol Use: No   Family History  Problem Relation Age of Onset  . Colon cancer Father 25  . Diabetes Mother   . Heart disease Mother   . Kidney disease Mother     ESRD  . Stroke Mother   . Hypertension Brother   . Breast cancer Sister   . Diabetes Other     nephews  . Colon cancer Sister   . Esophageal cancer Neg Hx   . Stomach cancer Neg Hx   . Rectal cancer Neg Hx    Allergies  Allergen Reactions  . Bee Venom Anaphylaxis  . Cetirizine Hcl     REACTION: does not work   Current Outpatient Prescriptions on File Prior to Visit  Medication Sig Dispense Refill  . acetaminophen (TYLENOL) 325 MG tablet Take 650 mg by mouth as needed.      Marland Kitchen albuterol (PROAIR HFA) 108 (90 BASE) MCG/ACT inhaler Inhale 2 puffs into the lungs every 4 (four) hours as needed.      . ALPRAZolam (XANAX) 0.25 MG tablet Take 1 tablet (0.25 mg total) by mouth  daily as needed for anxiety (take only if needed ). 30 tablet 0  . Azelastine HCl (ASTEPRO) 0.15 % SOLN Use 2 sprays two times a day as needed     . Calcium & Magnesium Carbonates (MYLANTA PO) Take 1 capsule by mouth as directed.      . dicyclomine (BENTYL) 10 MG capsule TAKE 1 CAPSULE (10 MG TOTAL) BY MOUTH 2 (TWO) TIMES DAILY AS NEEDED (SPASMS AND CRAMPING). 180 capsule 1  . EPINEPHrine (EPIPEN IJ) Inject as directed as needed.      . fluticasone (FLONASE) 50 MCG/ACT nasal spray PLACE 2 SPRAYS INTO THE NOSE DAILY. 16 g 3  . folic acid (FOLVITE) 1 MG tablet Take by mouth.    . gabapentin (NEURONTIN) 300 MG capsule Take 300 mg by mouth 2 (two) times daily.    Marland Kitchen loratadine (CLARITIN) 10 MG tablet Take 10 mg by mouth daily.    . methotrexate (RHEUMATREX) 2.5 MG tablet Take by mouth.    Marland Kitchen NEXIUM 40 MG capsule TAKE 1 CAPSULE BY MOUTH TWICE DAILY 60 capsule 5   No current facility-administered medications on file prior to visit.       Review of Systems Review of Systems  Constitutional: Negative for fever, appetite change, and unexpected weight change. pos for fatigue/malaise ENT pos for cong and rhinorrhea and ST Eyes: Negative for pain and visual disturbance.  Respiratory: Negative for wheeze  and shortness of breath.   Cardiovascular: Negative for cp or palpitations    Gastrointestinal: Negative for nausea, diarrhea and constipation.  Genitourinary: Negative for urgency and frequency.  Skin: Negative for pallor or rash   Neurological: Negative for weakness, light-headedness, numbness and headaches.  Hematological: Negative for adenopathy. Does not bruise/bleed easily.  Psychiatric/Behavioral: Negative for dysphoric mood. The patient is not nervous/anxious.         Objective:   Physical Exam  Constitutional: She appears well-developed and well-nourished. No distress.  obese and well appearing   HENT:  Head: Normocephalic and atraumatic.  Right Ear: External ear normal.  Left Ear: External ear normal.  Mouth/Throat: Oropharynx is clear and moist.  Nares are injected and congested  No sinus tenderness Clear rhinorrhea and post nasal drip   Eyes: Conjunctivae and EOM are normal. Pupils are equal, round, and reactive to light. Right eye exhibits no discharge. Left eye exhibits no discharge.  Neck: Normal range of motion. Neck supple.  Cardiovascular: Normal rate and normal heart sounds.   Pulmonary/Chest: Effort normal and breath sounds normal. No respiratory distress. She has no wheezes. She has no rales. She exhibits no tenderness.  Lymphadenopathy:    She has no cervical adenopathy.  Neurological: She is alert.  Skin: Skin is warm and dry. No rash noted.  Psychiatric: She has a normal mood and affect.          Assessment & Plan:   Problem List Items Addressed This Visit    Acute upper respiratory infection - Primary    Slowly improving in pt with RA Reassuring exam Watch for s/s of  sinusitis  Finished augmentin Update if not starting to improve in a week or if worsening

## 2015-04-02 NOTE — Progress Notes (Signed)
Pre visit review using our clinic review tool, if applicable. No additional management support is needed unless otherwise documented below in the visit note. 

## 2015-05-23 DIAGNOSIS — I00 Rheumatic fever without heart involvement: Secondary | ICD-10-CM | POA: Diagnosis not present

## 2015-05-29 DIAGNOSIS — M25562 Pain in left knee: Secondary | ICD-10-CM | POA: Diagnosis not present

## 2015-05-29 DIAGNOSIS — M25561 Pain in right knee: Secondary | ICD-10-CM | POA: Diagnosis not present

## 2015-05-29 DIAGNOSIS — M545 Low back pain: Secondary | ICD-10-CM | POA: Diagnosis not present

## 2015-05-29 DIAGNOSIS — G8929 Other chronic pain: Secondary | ICD-10-CM | POA: Diagnosis not present

## 2015-05-29 DIAGNOSIS — M0579 Rheumatoid arthritis with rheumatoid factor of multiple sites without organ or systems involvement: Secondary | ICD-10-CM | POA: Diagnosis not present

## 2015-05-31 DIAGNOSIS — M47816 Spondylosis without myelopathy or radiculopathy, lumbar region: Secondary | ICD-10-CM | POA: Diagnosis not present

## 2015-08-10 ENCOUNTER — Ambulatory Visit (INDEPENDENT_AMBULATORY_CARE_PROVIDER_SITE_OTHER): Payer: Medicare Other | Admitting: Family Medicine

## 2015-08-10 ENCOUNTER — Encounter: Payer: Self-pay | Admitting: Family Medicine

## 2015-08-10 VITALS — BP 136/76 | HR 73 | Temp 98.4°F | Ht 67.0 in | Wt 215.5 lb

## 2015-08-10 DIAGNOSIS — M5441 Lumbago with sciatica, right side: Secondary | ICD-10-CM

## 2015-08-10 DIAGNOSIS — Z23 Encounter for immunization: Secondary | ICD-10-CM

## 2015-08-10 MED ORDER — CYCLOBENZAPRINE HCL 10 MG PO TABS: 5.0000 mg | ORAL_TABLET | Freq: Three times a day (TID) | ORAL | Status: DC | PRN

## 2015-08-10 NOTE — Progress Notes (Signed)
Pre visit review using our clinic review tool, if applicable. No additional management support is needed unless otherwise documented below in the visit note. 

## 2015-08-10 NOTE — Progress Notes (Signed)
Subjective:    Patient ID: [name], female    DOB: 18-Nov-1940, 74 y.o.   MRN: [id number]  HPI Here with back pain   Had recent visit and xray with Dr Jefm Bryant  Told she had degenerative disease  Thinks she has arthritis  Known RA as well   Dr Ernestina Patches gave her shots in her back the last time (gave 2 shots -in 2013)  Has seen Dr Marlou Sa for her back   Pain in back has been worse in the past 2 months  Lower back  Sometimes runs down right leg (that is new)  Bending over and getting on knees makes it worse  Sitting makes it worse   Walking improves it   Sleeps on her side   Has not taken any flexeril lately   Methotrexate -on now  Gabapentin -on now    Patient Active Problem List   Diagnosis Date Noted  . Acute upper respiratory infection 04/02/2015  . Left shoulder pain 12/25/2014  . Left-sided chest wall pain 12/25/2014  . Fatigue 04/24/2014  . Obesity 04/24/2014  . Encounter for Medicare annual wellness exam 04/24/2014  . Post herpetic neuralgia 07/09/2011  . Elevated blood pressure 07/09/2011  . Low back pain 07/09/2011  . Left sided abdominal pain 06/12/2011  . Rheumatoid arthritis ([place]) 07/23/2007  . GERD 07/09/2007  . HYPERCHOLESTEROLEMIA 03/19/2007  . PSVT 03/19/2007  . ALLERGIC RHINITIS 03/19/2007  . ENDOMETRIAL POLYP 03/19/2007  . POSTMENOPAUSAL STATUS 03/19/2007  . OSTEOARTHRITIS 03/19/2007  . SPINAL STENOSIS 03/19/2007  . MURMUR 03/19/2007  . NEPHROLITHIASIS, HX OF 03/19/2007   Past Medical History  Diagnosis Date  . RA (rheumatoid arthritis) ([place])   . Allergic rhinitis   . Osteoarthritis   . Gastritis   . Hyperlipemia   . Former smoker   . Hemorrhoids   . Personal history of colonic polyps 1998    hyperplastic  . Esophageal reflux   . Fibula fracture 02/2005  . History of nephrolithiasis   . Shingles 2012  . Anxiety   . Heart murmur   . Diverticulosis    Past Surgical History  Procedure Laterality Date  . Cholecystectomy      . Vaginal polyp excised  12/2000    benign  . Lipoma excision  11/2002  . Svt s/p surgery--? ablation    . Ep procedure  1994    SVT; normal echo  . Esophagogastroduodenoscopy  11/03    reactive gastropathy  . Colonoscopy  11/08    internal hemorrhoids  . Upper gastrointestinal endoscopy     Social History  Substance Use Topics  . Smoking status: Former Smoker    Quit date: 10/21/2003  . Smokeless tobacco: Never Used  . Alcohol Use: No   Family History  Problem Relation Age of Onset  . Colon cancer Father 73  . Diabetes Mother   . Heart disease Mother   . Kidney disease Mother     ESRD  . Stroke Mother   . Hypertension Brother   . Breast cancer Sister   . Diabetes Other     nephews  . Colon cancer Sister   . Esophageal cancer Neg Hx   . Stomach cancer Neg Hx   . Rectal cancer Neg Hx    Allergies  Allergen Reactions  . Bee Venom Anaphylaxis  . Cetirizine Hcl     REACTION: does not work   Current Outpatient Prescriptions on File Prior to Visit  Medication Sig Dispense Refill  .  acetaminophen (TYLENOL) 325 MG tablet Take 650 mg by mouth as needed.      Marland Kitchen albuterol (PROAIR HFA) 108 (90 BASE) MCG/ACT inhaler Inhale 2 puffs into the lungs every 4 (four) hours as needed.      . ALPRAZolam (XANAX) 0.25 MG tablet Take 1 tablet (0.25 mg total) by mouth daily as needed for anxiety (take only if needed ). 30 tablet 0  . Azelastine HCl (ASTEPRO) 0.15 % SOLN Use 2 sprays two times a day as needed     . Calcium & Magnesium Carbonates (MYLANTA PO) Take 1 capsule by mouth as directed.      . dicyclomine (BENTYL) 10 MG capsule TAKE 1 CAPSULE (10 MG TOTAL) BY MOUTH 2 (TWO) TIMES DAILY AS NEEDED (SPASMS AND CRAMPING). 180 capsule 1  . EPINEPHrine (EPIPEN IJ) Inject as directed as needed.      . fluticasone (FLONASE) 50 MCG/ACT nasal spray PLACE 2 SPRAYS INTO THE NOSE DAILY. 16 g 3  . folic acid (FOLVITE) 1 MG tablet Take by mouth.    . gabapentin (NEURONTIN) 300 MG capsule Take 300  mg by mouth 2 (two) times daily.    Marland Kitchen loratadine (CLARITIN) 10 MG tablet Take 10 mg by mouth daily.    . methotrexate (RHEUMATREX) 2.5 MG tablet Take by mouth.    Marland Kitchen NEXIUM 40 MG capsule TAKE 1 CAPSULE BY MOUTH TWICE DAILY 60 capsule 5   No current facility-administered medications on file prior to visit.    Review of Systems Review of Systems  Constitutional: Negative for fever, appetite change, fatigue and unexpected weight change.  Eyes: Negative for pain and visual disturbance.  Respiratory: Negative for cough and shortness of breath.   Cardiovascular: Negative for cp or palpitations    Gastrointestinal: Negative for nausea, diarrhea and constipation.  Genitourinary: Negative for urgency and frequency.  Skin: Negative for pallor or rash   MSK pos for low back pain and also baseline joint pain or RA Neurological: Negative for weakness, light-headedness, numbness and headaches.  Hematological: Negative for adenopathy. Does not bruise/bleed easily.  Psychiatric/Behavioral: Negative for dysphoric mood. The patient is not nervous/anxious.         Objective:   Physical Exam  Constitutional: She appears well-developed and well-nourished. No distress.  obese and well appearing   HENT:  Head: Normocephalic and atraumatic.  Eyes: Conjunctivae are normal. Pupils are equal, round, and reactive to light.  Neck: Normal range of motion. Neck supple.  Cardiovascular: Normal rate and regular rhythm.   Pulmonary/Chest: Effort normal and breath sounds normal.  Musculoskeletal: She exhibits tenderness.       Lumbar back: She exhibits decreased range of motion, tenderness, bony tenderness and spasm. She exhibits no edema and no deformity.  Tight lumbar musculature bilat   Some tenderness of L2- L4  Neg slr  Flex 40 deg/ ext 10 deg Pain on lat bend bilat  No crepitus   No neuro deficits   Gait is slow     Lymphadenopathy:    She has no cervical adenopathy.  Neurological: She is alert.  She has normal reflexes. She displays no atrophy. No sensory deficit. She exhibits normal muscle tone.  Skin: Skin is warm and dry. No rash noted. No erythema.  Psychiatric: She has a normal mood and affect.         Assessment & Plan:   Problem List Items Addressed This Visit      Other   Low back pain - Primary  Pt has ongoing low back pain -now with radiculopathy symptoms to R leg (no neuro change on exam)  In pt with RA Known arthritis and deg disc dz with ? Epidural inj in the past  Cannot bring up latest spine film from Dr Hilma Favors for them  ? If will need MRI or PT Flexeril recommended for spasm with intermittent heat and walking Will make plan after xray report reviewe       Relevant Medications   cyclobenzaprine (FLEXERIL) 10 MG tablet    Other Visit Diagnoses    Need for influenza vaccination        Relevant Orders    Flu Vaccine QUAD 36+ mos PF IM (Fluarix & Fluzone Quad PF) (Completed)

## 2015-08-10 NOTE — Patient Instructions (Signed)
Try the muscle relaxer (flexeril) - when not working or driving because it can sedate  I will send for your xrays from Dr Jefm Bryant and see what they show You may be a candidate for physical therapy or a specialist for your back (you have a combination of arthritis/ degeneration of discs and muscle spasm)  A pinched nerve causes pain down your leg

## 2015-08-12 NOTE — Assessment & Plan Note (Signed)
Pt has ongoing low back pain -now with radiculopathy symptoms to R leg (no neuro change on exam)  In pt with RA Known arthritis and deg disc dz with ? Epidural inj in the past  Cannot bring up latest spine film from Dr Hilma Favors for them  ? If will need MRI or PT Flexeril recommended for spasm with intermittent heat and walking Will make plan after xray report reviewe

## 2015-08-22 DIAGNOSIS — M0579 Rheumatoid arthritis with rheumatoid factor of multiple sites without organ or systems involvement: Secondary | ICD-10-CM | POA: Diagnosis not present

## 2015-08-29 DIAGNOSIS — M0579 Rheumatoid arthritis with rheumatoid factor of multiple sites without organ or systems involvement: Secondary | ICD-10-CM | POA: Diagnosis not present

## 2015-08-29 DIAGNOSIS — M545 Low back pain: Secondary | ICD-10-CM | POA: Diagnosis not present

## 2015-10-14 ENCOUNTER — Other Ambulatory Visit: Payer: Self-pay | Admitting: Gastroenterology

## 2015-10-16 ENCOUNTER — Encounter: Payer: Self-pay | Admitting: Family Medicine

## 2015-10-16 ENCOUNTER — Ambulatory Visit (INDEPENDENT_AMBULATORY_CARE_PROVIDER_SITE_OTHER): Payer: Medicare Other | Admitting: Family Medicine

## 2015-10-16 VITALS — BP 136/78 | HR 88 | Temp 98.2°F | Wt 212.8 lb

## 2015-10-16 DIAGNOSIS — J0100 Acute maxillary sinusitis, unspecified: Secondary | ICD-10-CM | POA: Diagnosis not present

## 2015-10-16 DIAGNOSIS — J019 Acute sinusitis, unspecified: Secondary | ICD-10-CM | POA: Insufficient documentation

## 2015-10-16 MED ORDER — AMOXICILLIN-POT CLAVULANATE 875-125 MG PO TABS: 1.0000 | ORAL_TABLET | Freq: Two times a day (BID) | ORAL | Status: DC

## 2015-10-16 NOTE — Assessment & Plan Note (Signed)
With viral uri in immunocompromised pt  She is holding methotrexate while sick Cover with augmentin  Disc symptomatic care - see instructions on AVS  Update if not starting to improve in a week or if worsening

## 2015-10-16 NOTE — Progress Notes (Signed)
Pre visit review using our clinic review tool, if applicable. No additional management support is needed unless otherwise documented below in the visit note. 

## 2015-10-16 NOTE — Patient Instructions (Signed)
I think you have a cold that is turning into a sinus infection  Do not re start your methotrexate until you are better  Take augmentin for the sinus infection Try mucinex DM over the counter for cough and congestion  Drink lots of fluids and rest and breathe steam for congestion   Update if not starting to improve in a week or if worsening

## 2015-10-16 NOTE — Progress Notes (Signed)
Subjective:    Patient ID: [name], female    DOB: November 29, 1940, 74 y.o.   MRN: [id number]  HPI Here with sinus symptoms  Started Thursday with a cough   In bed all weekend  Some sweats- ? Unsure if fever  No aches  Sinus pressure - above and below eyes  Sides hurt from coughing  Cough -phlegm-is clear  Nasal d/c is yellow   Throat is sore Ears are hurting   ? If a cold   On MTX- she stopped it when she started getting sick   Patient Active Problem List   Diagnosis Date Noted  . Acute sinusitis 10/16/2015  . Acute upper respiratory infection 04/02/2015  . Left shoulder pain 12/25/2014  . Left-sided chest wall pain 12/25/2014  . Fatigue 04/24/2014  . Obesity 04/24/2014  . Encounter for Medicare annual wellness exam 04/24/2014  . Post herpetic neuralgia 07/09/2011  . Elevated blood pressure 07/09/2011  . Low back pain 07/09/2011  . Left sided abdominal pain 06/12/2011  . Rheumatoid arthritis ([place]) 07/23/2007  . GERD 07/09/2007  . HYPERCHOLESTEROLEMIA 03/19/2007  . PSVT 03/19/2007  . ALLERGIC RHINITIS 03/19/2007  . ENDOMETRIAL POLYP 03/19/2007  . POSTMENOPAUSAL STATUS 03/19/2007  . OSTEOARTHRITIS 03/19/2007  . SPINAL STENOSIS 03/19/2007  . MURMUR 03/19/2007  . NEPHROLITHIASIS, HX OF 03/19/2007   Past Medical History  Diagnosis Date  . RA (rheumatoid arthritis) ([place])   . Allergic rhinitis   . Osteoarthritis   . Gastritis   . Hyperlipemia   . Former smoker   . Hemorrhoids   . Personal history of colonic polyps 1998    hyperplastic  . Esophageal reflux   . Fibula fracture 02/2005  . History of nephrolithiasis   . Shingles 2012  . Anxiety   . Heart murmur   . Diverticulosis    Past Surgical History  Procedure Laterality Date  . Cholecystectomy    . Vaginal polyp excised  12/2000    benign  . Lipoma excision  11/2002  . Svt s/p surgery--? ablation    . Ep procedure  1994    SVT; normal echo  . Esophagogastroduodenoscopy  11/03    reactive  gastropathy  . Colonoscopy  11/08    internal hemorrhoids  . Upper gastrointestinal endoscopy     Social History  Substance Use Topics  . Smoking status: Former Smoker    Quit date: 10/21/2003  . Smokeless tobacco: Never Used  . Alcohol Use: No   Family History  Problem Relation Age of Onset  . Colon cancer Father 30  . Diabetes Mother   . Heart disease Mother   . Kidney disease Mother     ESRD  . Stroke Mother   . Hypertension Brother   . Breast cancer Sister   . Diabetes Other     nephews  . Colon cancer Sister   . Esophageal cancer Neg Hx   . Stomach cancer Neg Hx   . Rectal cancer Neg Hx    Allergies  Allergen Reactions  . Bee Venom Anaphylaxis  . Cetirizine Hcl     REACTION: does not work   Current Outpatient Prescriptions on File Prior to Visit  Medication Sig Dispense Refill  . acetaminophen (TYLENOL) 325 MG tablet Take 650 mg by mouth as needed.      Marland Kitchen albuterol (PROAIR HFA) 108 (90 BASE) MCG/ACT inhaler Inhale 2 puffs into the lungs every 4 (four) hours as needed.      . ALPRAZolam (XANAX) 0.25  MG tablet Take 1 tablet (0.25 mg total) by mouth daily as needed for anxiety (take only if needed ). 30 tablet 0  . Azelastine HCl (ASTEPRO) 0.15 % SOLN Use 2 sprays two times a day as needed     . Calcium & Magnesium Carbonates (MYLANTA PO) Take 1 capsule by mouth as directed.      . cyclobenzaprine (FLEXERIL) 10 MG tablet Take 0.5-1 tablets (5-10 mg total) by mouth 3 (three) times daily as needed for muscle spasms (watch for sedation). 30 tablet 0  . dicyclomine (BENTYL) 10 MG capsule TAKE 1 CAPSULE (10 MG TOTAL) BY MOUTH 2 (TWO) TIMES DAILY AS NEEDED (SPASMS AND CRAMPING). 180 capsule 1  . EPINEPHrine (EPIPEN IJ) Inject as directed as needed.      . fluticasone (FLONASE) 50 MCG/ACT nasal spray PLACE 2 SPRAYS INTO THE NOSE DAILY. 16 g 3  . folic acid (FOLVITE) 1 MG tablet Take by mouth.    . gabapentin (NEURONTIN) 300 MG capsule Take 300 mg by mouth 2 (two) times  daily.    Marland Kitchen loratadine (CLARITIN) 10 MG tablet Take 10 mg by mouth daily.    . methotrexate (RHEUMATREX) 2.5 MG tablet Take by mouth.    Marland Kitchen NEXIUM 40 MG capsule TAKE 1 CAPSULE BY MOUTH TWICE DAILY 60 capsule 5   No current facility-administered medications on file prior to visit.    Review of Systems Review of Systems  Constitutional: Negative for fever, appetite change,  and unexpected weight change.  ENT pos for cong/ rhinorrhea and sinus pain  Eyes: Negative for pain and visual disturbance.  Respiratory: Negative for wheeze  and shortness of breath.   Cardiovascular: Negative for cp or palpitations    Gastrointestinal: Negative for nausea, diarrhea and constipation.  Genitourinary: Negative for urgency and frequency.  Skin: Negative for pallor or rash   Neurological: Negative for weakness, light-headedness, numbness and headaches.  Hematological: Negative for adenopathy. Does not bruise/bleed easily.  Psychiatric/Behavioral: Negative for dysphoric mood. The patient is not nervous/anxious.         Objective:   Physical Exam  Constitutional: She appears well-developed and well-nourished. No distress.  obese and well appearing   HENT:  Head: Normocephalic and atraumatic.  Right Ear: External ear normal.  Left Ear: External ear normal.  Mouth/Throat: Oropharynx is clear and moist. No oropharyngeal exudate.  Nares are injected and congested  Bilateral frontal and maxillarysinus tenderness (marked) Post nasal drip   Eyes: Conjunctivae and EOM are normal. Pupils are equal, round, and reactive to light. Right eye exhibits no discharge. Left eye exhibits no discharge.  Neck: Normal range of motion. Neck supple.  Cardiovascular: Normal rate and regular rhythm.   Pulmonary/Chest: Effort normal and breath sounds normal. No respiratory distress. She has no wheezes. She has no rales.  Lymphadenopathy:    She has no cervical adenopathy.  Neurological: She is alert. No cranial nerve  deficit.  Skin: Skin is warm and dry. No rash noted.  Psychiatric: She has a normal mood and affect.          Assessment & Plan:   Problem List Items Addressed This Visit      Respiratory   Acute sinusitis - Primary    With viral uri in immunocompromised pt  She is holding methotrexate while sick Cover with augmentin  Disc symptomatic care - see instructions on AVS  Update if not starting to improve in a week or if worsening  Relevant Medications   amoxicillin-clavulanate (AUGMENTIN) 875-125 MG tablet

## 2015-10-21 HISTORY — PX: EYE SURGERY: SHX253

## 2015-11-01 ENCOUNTER — Telehealth: Payer: Self-pay | Admitting: Gastroenterology

## 2015-11-01 ENCOUNTER — Telehealth: Payer: Self-pay | Admitting: Family Medicine

## 2015-11-01 NOTE — Telephone Encounter (Signed)
Patient is scheduled for follow up GERD for 12/05/15

## 2015-11-01 NOTE — Telephone Encounter (Signed)
Patient Name: [name] DOB: [birth year] Initial Comment Caller states she has a hiatal hernia. She states she has indigestion in her chest. She said she has pain in her chest. Nurse Assessment Nurse: Vallery Sa, RN, Tye [place] Date/Time (Eastern Time): 11/01/2015 11:47:34 AM Confirm and document reason for call. If symptomatic, describe symptoms. ---Caller states she developed chest pain 3 days ago that is still present. No breathing difficulty. No injury in the past 3 days. Alert and responsive. Has the patient traveled out of the country within the last 30 days? ---No Does the patient have any new or worsening symptoms? ---Yes Will a triage be completed? ---Yes Related visit to physician within the last 2 weeks? ---No Does the PT have any chronic conditions? (i.e. diabetes, asthma, etc.) ---Yes List chronic conditions. ---Acid Reflux, Arthritis Is this a behavioral health or substance abuse call? ---No Guidelines Guideline Title Affirmed Question Affirmed Notes Chest Pain [1] Chest pain lasts > 5 minutes AND [2] age > 17 Final Disposition User Call EMS 911 Now [place], RN, Tye [place] Comments Caller declines the Call 911 disposition. Reinforced the Call 911 disposition. She asks to see MD at the office. Called the office backline and Reena will assist her further. Disagree/Comply: Disagree Disagree/Comply Reason: Disagree with instructions

## 2015-11-01 NOTE — Telephone Encounter (Signed)
Aware-will watch for notes, thanks

## 2015-11-01 NOTE — Telephone Encounter (Signed)
Cathy with [facility] transferred pt to [facility]; 3 days ago started with sternal pain that comes and goes; pain usually comes back after eats anything. No SOB or dizziness. Now pain is minimal; when walks pain is better. Pt has hx of hiatal hernia and indigestion; pt has been taking Nexium as prescribed. Pt said this time the pain is different. Pt has no hx of heart or BP issues. Pt does not want to go to ED and no available appts at [facility]. Pt is going to call Dr Fuller Plan and if he cannot see pt she will go to ED for eval. If condition changes or worsens will go to ED. pts husband is at home and can get transportation to ED. Dr Glori Bickers out of office. Will send note to Dr Glori Bickers.

## 2015-11-02 ENCOUNTER — Emergency Department (HOSPITAL_COMMUNITY): Payer: Medicare Other

## 2015-11-02 ENCOUNTER — Emergency Department (HOSPITAL_COMMUNITY)
Admission: EM | Admit: 2015-11-02 | Discharge: 2015-11-03 | Disposition: A | Discharge status: Home / Self Care | Payer: Medicare Other | Attending: Emergency Medicine | Admitting: Emergency Medicine

## 2015-11-02 ENCOUNTER — Encounter (HOSPITAL_COMMUNITY): Payer: Self-pay

## 2015-11-02 ENCOUNTER — Other Ambulatory Visit: Payer: Self-pay

## 2015-11-02 DIAGNOSIS — R011 Cardiac murmur, unspecified: Secondary | ICD-10-CM | POA: Insufficient documentation

## 2015-11-02 DIAGNOSIS — Z86010 Personal history of colonic polyps: Secondary | ICD-10-CM | POA: Insufficient documentation

## 2015-11-02 DIAGNOSIS — Z87891 Personal history of nicotine dependence: Secondary | ICD-10-CM | POA: Diagnosis not present

## 2015-11-02 DIAGNOSIS — F419 Anxiety disorder, unspecified: Secondary | ICD-10-CM | POA: Insufficient documentation

## 2015-11-02 DIAGNOSIS — Z79899 Other long term (current) drug therapy: Secondary | ICD-10-CM | POA: Diagnosis not present

## 2015-11-02 DIAGNOSIS — Z8781 Personal history of (healed) traumatic fracture: Secondary | ICD-10-CM | POA: Diagnosis not present

## 2015-11-02 DIAGNOSIS — E785 Hyperlipidemia, unspecified: Secondary | ICD-10-CM | POA: Insufficient documentation

## 2015-11-02 DIAGNOSIS — K219 Gastro-esophageal reflux disease without esophagitis: Secondary | ICD-10-CM | POA: Insufficient documentation

## 2015-11-02 DIAGNOSIS — R079 Chest pain, unspecified: Secondary | ICD-10-CM | POA: Diagnosis not present

## 2015-11-02 DIAGNOSIS — R1013 Epigastric pain: Secondary | ICD-10-CM | POA: Diagnosis not present

## 2015-11-02 DIAGNOSIS — R109 Unspecified abdominal pain: Secondary | ICD-10-CM | POA: Diagnosis not present

## 2015-11-02 DIAGNOSIS — R0602 Shortness of breath: Secondary | ICD-10-CM | POA: Diagnosis not present

## 2015-11-02 LAB — CBC WITH DIFFERENTIAL/PLATELET
BASOS ABS: 0 10*3/uL (ref 0.0–0.1)
Basophils Relative: 0 %
EOS ABS: 0 10*3/uL (ref 0.0–0.7)
Eosinophils Relative: 1 %
HCT: 40 % (ref 36.0–46.0)
Hemoglobin: 13.5 g/dL (ref 12.0–15.0)
Lymphocytes Relative: 17 %
Lymphs Abs: 1.1 10*3/uL (ref 0.7–4.0)
MCH: 29.1 pg (ref 26.0–34.0)
MCHC: 33.8 g/dL (ref 30.0–36.0)
MCV: 86.2 fL (ref 78.0–100.0)
MONO ABS: 0.3 10*3/uL (ref 0.1–1.0)
Monocytes Relative: 4 %
Neutro Abs: 5.2 10*3/uL (ref 1.7–7.7)
Neutrophils Relative %: 78 %
Platelets: 231 10*3/uL (ref 150–400)
RBC: 4.64 MIL/uL (ref 3.87–5.11)
RDW: 14.4 % (ref 11.5–15.5)
WBC: 6.6 10*3/uL (ref 4.0–10.5)

## 2015-11-02 LAB — COMPREHENSIVE METABOLIC PANEL
ALT: 15 U/L (ref 14–54)
AST: 26 U/L (ref 15–41)
Albumin: 3.8 g/dL (ref 3.5–5.0)
Alkaline Phosphatase: 70 U/L (ref 38–126)
Anion gap: 10 (ref 5–15)
BUN: 6 mg/dL (ref 6–20)
CHLORIDE: 105 mmol/L (ref 101–111)
CO2: 24 mmol/L (ref 22–32)
CREATININE: 0.82 mg/dL (ref 0.44–1.00)
Calcium: 9 mg/dL (ref 8.9–10.3)
GFR calc non Af Amer: >60 mL/min (ref >60)
Glucose, Bld: 160 mg/dL — ABNORMAL HIGH (ref 65–99)
Potassium: 4.2 mmol/L (ref 3.5–5.1)
SODIUM: 139 mmol/L (ref 135–145)
Total Bilirubin: 0.5 mg/dL (ref 0.3–1.2)
Total Protein: 7.5 g/dL (ref 6.5–8.1)

## 2015-11-02 LAB — I-STAT TROPONIN, ED
TROPONIN I, POC: 0 ng/mL (ref 0.00–0.08)
TROPONIN I, POC: 0.01 ng/mL (ref 0.00–0.08)

## 2015-11-02 LAB — D-DIMER, QUANTITATIVE: D-Dimer, Quant: 0.89 ug/mL-FEU — ABNORMAL HIGH (ref 0.00–0.50)

## 2015-11-02 LAB — LIPASE, BLOOD: LIPASE: 40 U/L (ref 11–51)

## 2015-11-02 MED ORDER — PANTOPRAZOLE SODIUM 40 MG IV SOLR
40.0000 mg | Freq: Once | INTRAVENOUS | Status: AC
  Administered 2015-11-02: 40 mg via INTRAVENOUS
  Filled 2015-11-02: qty 40

## 2015-11-02 MED ORDER — PANTOPRAZOLE SODIUM 20 MG PO TBEC: 20.0000 mg | DELAYED_RELEASE_TABLET | Freq: Every day | ORAL | Status: DC

## 2015-11-02 MED ORDER — HYDROCODONE-ACETAMINOPHEN 5-325 MG PO TABS: 1.0000 | ORAL_TABLET | Freq: Four times a day (QID) | ORAL | Status: DC | PRN

## 2015-11-02 MED ORDER — ONDANSETRON HCL 4 MG/2ML IJ SOLN
4.0000 mg | Freq: Once | INTRAMUSCULAR | Status: AC
  Administered 2015-11-02: 4 mg via INTRAVENOUS
  Filled 2015-11-02: qty 2

## 2015-11-02 MED ORDER — ONDANSETRON 4 MG PO TBDP: ORAL_TABLET | ORAL | Status: DC

## 2015-11-02 MED ORDER — IOHEXOL 300 MG/ML  SOLN
100.0000 mL | Freq: Once | INTRAMUSCULAR | Status: AC | PRN
  Administered 2015-11-02: 100 mL via INTRAVENOUS

## 2015-11-02 MED ORDER — MORPHINE SULFATE (PF) 4 MG/ML IV SOLN
4.0000 mg | Freq: Once | INTRAVENOUS | Status: AC
  Administered 2015-11-02: 4 mg via INTRAVENOUS
  Filled 2015-11-02: qty 1

## 2015-11-02 MED ORDER — HYDROMORPHONE HCL 1 MG/ML IJ SOLN
1.0000 mg | Freq: Once | INTRAMUSCULAR | Status: AC
  Administered 2015-11-02: 1 mg via INTRAVENOUS
  Filled 2015-11-02: qty 1

## 2015-11-02 MED ORDER — GI COCKTAIL ~~LOC~~
30.0000 mL | Freq: Once | ORAL | Status: AC
  Administered 2015-11-02: 30 mL via ORAL
  Filled 2015-11-02: qty 30

## 2015-11-02 MED ORDER — SODIUM CHLORIDE 0.9 % IV BOLUS (SEPSIS)
1000.0000 mL | Freq: Once | INTRAVENOUS | Status: AC
  Administered 2015-11-02: 1000 mL via INTRAVENOUS

## 2015-11-02 MED ORDER — IOHEXOL 350 MG/ML SOLN
80.0000 mL | Freq: Once | INTRAVENOUS | Status: AC | PRN
  Administered 2015-11-02: 80 mL via INTRAVENOUS

## 2015-11-02 NOTE — Discharge Instructions (Signed)
Follow up with your md next week. °

## 2015-11-02 NOTE — ED Provider Notes (Signed)
CSN: [id number]     Arrival date & time 11/02/15  0757 History   First MD Initiated Contact with Patient 11/02/15 0809     No chief complaint on file.    (Consider location/radiation/quality/duration/timing/severity/associated sxs/prior Treatment) Patient is a 75 y.o. female presenting with abdominal pain. The history is provided by the patient (Patient complains of upper get epigastric abdominal pain she states it is sharp in nature she's been nauseated).  Abdominal Pain Pain location:  Epigastric Pain quality: aching   Pain radiates to:  Does not radiate Pain severity:  Moderate Onset quality:  Sudden Timing:  Constant Progression:  Worsening Chronicity:  New Context: not alcohol use   Associated symptoms: no chest pain, no cough, no diarrhea, no fatigue and no hematuria     Past Medical History  Diagnosis Date  . RA (rheumatoid arthritis) ([place])   . Allergic rhinitis   . Osteoarthritis   . Gastritis   . Hyperlipemia   . Former smoker   . Hemorrhoids   . Personal history of colonic polyps 1998    hyperplastic  . Esophageal reflux   . Fibula fracture 02/2005  . History of nephrolithiasis   . Shingles 2012  . Anxiety   . Heart murmur   . Diverticulosis    Past Surgical History  Procedure Laterality Date  . Cholecystectomy    . Vaginal polyp excised  12/2000    benign  . Lipoma excision  11/2002  . Svt s/p surgery--? ablation    . Ep procedure  1994    SVT; normal echo  . Esophagogastroduodenoscopy  11/03    reactive gastropathy  . Colonoscopy  11/08    internal hemorrhoids  . Upper gastrointestinal endoscopy     Family History  Problem Relation Age of Onset  . Colon cancer Father 35  . Diabetes Mother   . Heart disease Mother   . Kidney disease Mother     ESRD  . Stroke Mother   . Hypertension Brother   . Breast cancer Sister   . Diabetes Other     nephews  . Colon cancer Sister   . Esophageal cancer Neg Hx   . Stomach cancer Neg Hx   . Rectal cancer  Neg Hx    Social History  Substance Use Topics  . Smoking status: Former Smoker    Quit date: 10/21/2003  . Smokeless tobacco: Never Used  . Alcohol Use: No   OB History    No data available     Review of Systems  Constitutional: Negative for appetite change and fatigue.  HENT: Negative for congestion, ear discharge and sinus pressure.   Eyes: Negative for discharge.  Respiratory: Negative for cough.   Cardiovascular: Negative for chest pain.  Gastrointestinal: Positive for abdominal pain. Negative for diarrhea.  Genitourinary: Negative for frequency and hematuria.  Musculoskeletal: Negative for back pain.  Skin: Negative for rash.  Neurological: Negative for seizures and headaches.  Psychiatric/Behavioral: Negative for hallucinations.      Allergies  Bee venom and Cetirizine hcl  Home Medications   Prior to Admission medications   Medication Sig Start Date End Date Taking? Authorizing Provider  albuterol (PROAIR HFA) 108 (90 BASE) MCG/ACT inhaler Inhale 2 puffs into the lungs every 4 (four) hours as needed.     Yes Historical Provider, MD  Calcium & Magnesium Carbonates (MYLANTA PO) Take 1 capsule by mouth as directed.     Yes Historical Provider, MD  cyclobenzaprine (FLEXERIL) 10 MG tablet Take  0.5-1 tablets (5-10 mg total) by mouth 3 (three) times daily as needed for muscle spasms (watch for sedation). 08/10/15  Yes Abner Greenspan, MD  dicyclomine (BENTYL) 10 MG capsule TAKE 1 CAPSULE (10 MG TOTAL) BY MOUTH 2 (TWO) TIMES DAILY AS NEEDED (SPASMS AND CRAMPING). Patient taking differently: Take 10 mg by mouth 2 (two) times daily as needed for spasms.  02/20/15  Yes Ladene Artist, MD  folic acid (FOLVITE) 1 MG tablet Take 1 mg by mouth daily.  02/23/15 02/23/16 Yes Historical Provider, MD  loratadine (CLARITIN) 10 MG tablet Take 10 mg by mouth daily.   Yes Historical Provider, MD  NEXIUM 40 MG capsule TAKE 1 CAPSULE BY MOUTH TWICE DAILY 08/14/14  Yes Ladene Artist, MD   ALPRAZolam Duanne Moron) 0.25 MG tablet Take 1 tablet (0.25 mg total) by mouth daily as needed for anxiety (take only if needed ). Patient not taking: Reported on 11/02/2015 01/14/11   Abner Greenspan, MD  EPINEPHrine (EPIPEN IJ) Inject as directed as needed.      Historical Provider, MD  fluticasone (FLONASE) 50 MCG/ACT nasal spray PLACE 2 SPRAYS INTO THE NOSE DAILY. 06/05/14   Abner Greenspan, MD  HYDROcodone-acetaminophen (NORCO/VICODIN) 5-325 MG tablet Take 1 tablet by mouth every 6 (six) hours as needed for moderate pain. 11/02/15   Milton Ferguson, MD  ondansetron (ZOFRAN ODT) 4 MG disintegrating tablet 4mg  ODT q4 hours prn nausea/vomit 11/02/15   Milton Ferguson, MD  pantoprazole (PROTONIX) 20 MG tablet Take 1 tablet (20 mg total) by mouth daily. 11/02/15   Milton Ferguson, MD   BP 135/79 mmHg  Pulse 64  Temp(Src) 97.8 F (36.6 C) (Oral)  Resp 18  Ht 5\' 7"  (1.702 m)  Wt 212 lb (96.163 kg)  BMI 33.20 kg/m2  SpO2 95% Physical Exam  Constitutional: She is oriented to person, place, and time. She appears well-developed.  HENT:  Head: Normocephalic.  Eyes: Conjunctivae and EOM are normal. No scleral icterus.  Neck: Neck supple. No thyromegaly present.  Cardiovascular: Normal rate and regular rhythm.  Exam reveals no gallop and no friction rub.   No murmur heard. Pulmonary/Chest: No stridor. She has no wheezes. She has no rales. She exhibits no tenderness.  Abdominal: She exhibits no distension. There is tenderness. There is no rebound.  Tender epigastric area moderate  Musculoskeletal: Normal range of motion. She exhibits no edema.  Lymphadenopathy:    She has no cervical adenopathy.  Neurological: She is oriented to person, place, and time. She exhibits normal muscle tone. Coordination normal.  Skin: No rash noted. No erythema.  Psychiatric: She has a normal mood and affect. Her behavior is normal.    ED Course  Procedures (including critical care time) Labs Review Labs Reviewed  COMPREHENSIVE  METABOLIC PANEL - Abnormal; Notable for the following:    Glucose, Bld 160 (*)    All other components within normal limits  D-DIMER, QUANTITATIVE (NOT AT [facility]) - Abnormal; Notable for the following:    D-Dimer, Quant 0.89 (*)    All other components within normal limits  CBC WITH DIFFERENTIAL/PLATELET  LIPASE, BLOOD  I-STAT TROPOININ, ED  I-STAT TROPOININ, ED    Imaging Review Ct Angio Chest Pe W/cm &/or Wo Cm  11/02/2015  CLINICAL DATA:  Three-day history of shortness of breath and chest pain EXAM: CT ANGIOGRAPHY CHEST WITH CONTRAST TECHNIQUE: Multidetector CT imaging of the chest was performed using the standard protocol during bolus administration of intravenous contrast. Multiplanar CT image reconstructions  and MIPs were obtained to evaluate the vascular anatomy. CONTRAST:  69mL OMNIPAQUE IOHEXOL 350 MG/ML SOLN COMPARISON:  Chest CT April 04, 2006; chest radiograph November 02, 2015 FINDINGS: There is no demonstrable pulmonary embolus. There is no thoracic aortic aneurysm or dissection. The visualized great vessels appear normal except for scattered foci of calcification causing less than 50% diameter narrowing in visualized regions. There is also scattered calcification in the thoracic aorta. There is patchy atelectatic change in each lower lobe. There is no edema or consolidation. Thyroid appears unremarkable. There is appreciable thoracic adenopathy. There is mild left ventricular hypertrophy. Pericardium is not thickened. There are foci coronary artery calcification. Visualized upper abdominal structures appear unremarkable. There is 2 postoperative change in the lower cervical region. There is degenerative change in the thoracic spine. There are no blastic or lytic bone lesions. Review of the MIP images confirms the above findings. IMPRESSION: No demonstrable pulmonary embolus. Patchy lower lobe bilateral atelectatic change. No frank edema or consolidation. No adenopathy. There are foci of  coronary artery calcification. There is left ventricular hypertrophy. Scattered foci of atherosclerotic change are also noted in the thoracic aorta and proximal great vessels. Electronically Signed   By: Lowella Grip III M.D.   On: 11/02/2015 13:31   US Abdomen Complete  11/02/2015  CLINICAL DATA:  75 year old female with midline abdominal pain for 3 days. Previous cholecystectomy. Initial encounter. EXAM: ABDOMEN ULTRASOUND COMPLETE COMPARISON:  Lumbar MRI 07/27/2012 FINDINGS: Gallbladder: Surgically absent Common bile duct: Diameter: 5 mm, normal Liver: Diffusely echogenic (image 9). Coarse echotexture. No intrahepatic biliary ductal dilatation or discrete liver lesion. IVC: No abnormality visualized. Pancreas: Visualized portion unremarkable. Spleen: Size and appearance within normal limits. Right Kidney: Length: 11.4 cm. Echogenicity within normal limits. No mass or hydronephrosis visualized. Left Kidney: Length: 12.9 cm. Echogenicity within normal limits. No mass or hydronephrosis visualized. Abdominal aorta: No aneurysm visualized. Other findings: None. IMPRESSION: Fatty liver disease. Otherwise negative abdomen ultrasound status post cholecystectomy. Electronically Signed   By: Genevie Ann M.D.   On: 11/02/2015 09:25   Ct Abdomen Pelvis W Contrast  11/02/2015  CLINICAL DATA:  Intermittent abdominal pain for 2 days. EXAM: CT ABDOMEN AND PELVIS WITH CONTRAST TECHNIQUE: Multidetector CT imaging of the abdomen and pelvis was performed using the standard protocol following bolus administration of intravenous contrast. CONTRAST:  162mL OMNIPAQUE IOHEXOL 300 MG/ML  SOLN COMPARISON:  None. FINDINGS: Lower chest: The lung bases are clear except for dependent subpleural atelectasis. No worrisome pulmonary lesions or pleural effusion. The heart is normal in size. No pericardial effusion. The distal esophagus is grossly normal. Hepatobiliary: Mild diffuse fatty infiltration of the liver but no focal hepatic  lesions or intrahepatic biliary dilatation. The gallbladder is surgically absent. No common bile duct dilatation. Pancreas: No mass, inflammation or ductal dilatation. Spleen: Normal size.  No focal lesions. Adrenals/Urinary Tract: The adrenal glands are normal. Both kidneys are normal. Stomach/Bowel: The stomach, duodenum, small bowel and colon are grossly normal without oral contrast. No inflammatory changes, mass lesions or obstructive findings. The terminal ileum is normal. The appendix is normal. Vascular/Lymphatic: Moderate atherosclerotic calcifications involving the aorta and iliac arteries but no focal aneurysm or dissection. The branch vessels are patent. The major venous structures are patent. Other: The uterus demonstrates multiple small calcified fibroids. Both ovaries are normal. No pelvic mass or adenopathy. No free pelvic fluid collections. The bladder is normal. No inguinal mass or adenopathy. No abdominal wall hernia. Musculoskeletal: No significant bony findings. Moderate degenerative changes  involving the lower lumbar spine. IMPRESSION: 1. No acute abdominal/pelvic findings, mass lesions or adenopathy. 2. Diffuse fatty infiltration of the liver. 3. Moderate atherosclerotic calcifications involving the aorta and branch vessels but no aneurysm or dissection. 4. Status post cholecystectomy.  No biliary dilatation. 5. Uterine fibroids. Electronically Signed   By: Marijo Sanes M.D.   On: 11/02/2015 10:49   Dg Chest Port 1 View  11/02/2015  CLINICAL DATA:  Chest pain and shortness of breath for 3 days. EXAM: PORTABLE CHEST 1 VIEW COMPARISON:  12/25/2014 FINDINGS: The cardiac silhouette, mediastinal and hilar contours are within normal limits and stable. The lungs are clear of acute process. Streaky basilar scarring changes. No pleural effusion. The bony thorax is intact. IMPRESSION: No acute cardiopulmonary findings. Electronically Signed   By: Marijo Sanes M.D.   On: 11/02/2015 08:29   I have  personally reviewed and evaluated these images and lab results as part of my medical decision-making.   EKG Interpretation None      MDM   Final diagnoses:  Abdominal pain in female    Labs T scan of chest and abdomen were unremarkable. Suspect gastritis patient will be put on protonic Vicodin and Phenergan and will follow-up with PCP    Milton Ferguson, MD 11/02/15 1407

## 2015-11-02 NOTE — ED Notes (Signed)
Pt presents with 2 day h/o intermittent chest pain that has been constant today.  Pt reports shortness of breath, diaphoresis at onset.

## 2015-11-07 ENCOUNTER — Ambulatory Visit (INDEPENDENT_AMBULATORY_CARE_PROVIDER_SITE_OTHER): Payer: Medicare Other | Admitting: Family Medicine

## 2015-11-07 ENCOUNTER — Encounter: Payer: Self-pay | Admitting: Family Medicine

## 2015-11-07 VITALS — BP 148/70 | HR 80 | Temp 98.6°F | Ht 67.0 in | Wt 211.5 lb

## 2015-11-07 DIAGNOSIS — R079 Chest pain, unspecified: Secondary | ICD-10-CM | POA: Insufficient documentation

## 2015-11-07 DIAGNOSIS — K2970 Gastritis, unspecified, without bleeding: Secondary | ICD-10-CM

## 2015-11-07 DIAGNOSIS — I2584 Coronary atherosclerosis due to calcified coronary lesion: Secondary | ICD-10-CM | POA: Diagnosis not present

## 2015-11-07 DIAGNOSIS — K760 Fatty (change of) liver, not elsewhere classified: Secondary | ICD-10-CM

## 2015-11-07 DIAGNOSIS — I2510 Atherosclerotic heart disease of native coronary artery without angina pectoris: Secondary | ICD-10-CM

## 2015-11-07 DIAGNOSIS — E669 Obesity, unspecified: Secondary | ICD-10-CM

## 2015-11-07 MED ORDER — RANITIDINE HCL 150 MG PO CAPS: 150.0000 mg | ORAL_CAPSULE | Freq: Two times a day (BID) | ORAL | Status: DC

## 2015-11-07 NOTE — Assessment & Plan Note (Signed)
This was seen incidentally on radiology studies in ED for abd/chest pain  Ref to cardiol Given info on low cholesterol diet  Lab Results  Component Value Date   CHOL 182 04/26/2014   HDL 49.00 04/26/2014   LDLCALC 90 04/26/2014   TRIG 215.0* 04/26/2014   CHOLHDL 4 04/26/2014

## 2015-11-07 NOTE — Progress Notes (Signed)
Pre visit review using our clinic review tool, if applicable. No additional management support is needed unless otherwise documented below in the visit note. 

## 2015-11-07 NOTE — Assessment & Plan Note (Signed)
Mostly epigastric with some chest burning and heartburn Rev ED w/u - imaging  Nl EKG  Did see some evidence of atherosclerosis on CT /cxr-ref to cardiol  Rev low fat/cholesterol diet   Update if sob or if symptoms change

## 2015-11-07 NOTE — Progress Notes (Signed)
Subjective:    Patient ID: [name], female    DOB: 07-25-1941, 75 y.o.   MRN: [id number]  HPI Here for f/u of ED visit for abd pain 1/13  Was in epigastric area  Ct Angio Chest Pe W/cm &/or Wo Cm  11/02/2015  CLINICAL DATA:  Three-day history of shortness of breath and chest pain EXAM: CT ANGIOGRAPHY CHEST WITH CONTRAST TECHNIQUE: Multidetector CT imaging of the chest was performed using the standard protocol during bolus administration of intravenous contrast. Multiplanar CT image reconstructions and MIPs were obtained to evaluate the vascular anatomy. CONTRAST:  45mL OMNIPAQUE IOHEXOL 350 MG/ML SOLN COMPARISON:  Chest CT April 04, 2006; chest radiograph November 02, 2015 FINDINGS: There is no demonstrable pulmonary embolus. There is no thoracic aortic aneurysm or dissection. The visualized great vessels appear normal except for scattered foci of calcification causing less than 50% diameter narrowing in visualized regions. There is also scattered calcification in the thoracic aorta. There is patchy atelectatic change in each lower lobe. There is no edema or consolidation. Thyroid appears unremarkable. There is appreciable thoracic adenopathy. There is mild left ventricular hypertrophy. Pericardium is not thickened. There are foci coronary artery calcification. Visualized upper abdominal structures appear unremarkable. There is 2 postoperative change in the lower cervical region. There is degenerative change in the thoracic spine. There are no blastic or lytic bone lesions. Review of the MIP images confirms the above findings. IMPRESSION: No demonstrable pulmonary embolus. Patchy lower lobe bilateral atelectatic change. No frank edema or consolidation. No adenopathy. There are foci of coronary artery calcification. There is left ventricular hypertrophy. Scattered foci of atherosclerotic change are also noted in the thoracic aorta and proximal great vessels. Electronically Signed   By: Lowella Grip III M.D.   On: 11/02/2015 13:31   US Abdomen Complete  11/02/2015  CLINICAL DATA:  75 year old female with midline abdominal pain for 3 days. Previous cholecystectomy. Initial encounter. EXAM: ABDOMEN ULTRASOUND COMPLETE COMPARISON:  Lumbar MRI 07/27/2012 FINDINGS: Gallbladder: Surgically absent Common bile duct: Diameter: 5 mm, normal Liver: Diffusely echogenic (image 9). Coarse echotexture. No intrahepatic biliary ductal dilatation or discrete liver lesion. IVC: No abnormality visualized. Pancreas: Visualized portion unremarkable. Spleen: Size and appearance within normal limits. Right Kidney: Length: 11.4 cm. Echogenicity within normal limits. No mass or hydronephrosis visualized. Left Kidney: Length: 12.9 cm. Echogenicity within normal limits. No mass or hydronephrosis visualized. Abdominal aorta: No aneurysm visualized. Other findings: None. IMPRESSION: Fatty liver disease. Otherwise negative abdomen ultrasound status post cholecystectomy. Electronically Signed   By: Genevie Ann M.D.   On: 11/02/2015 09:25   Ct Abdomen Pelvis W Contrast  11/02/2015  CLINICAL DATA:  Intermittent abdominal pain for 2 days. EXAM: CT ABDOMEN AND PELVIS WITH CONTRAST TECHNIQUE: Multidetector CT imaging of the abdomen and pelvis was performed using the standard protocol following bolus administration of intravenous contrast. CONTRAST:  185mL OMNIPAQUE IOHEXOL 300 MG/ML  SOLN COMPARISON:  None. FINDINGS: Lower chest: The lung bases are clear except for dependent subpleural atelectasis. No worrisome pulmonary lesions or pleural effusion. The heart is normal in size. No pericardial effusion. The distal esophagus is grossly normal. Hepatobiliary: Mild diffuse fatty infiltration of the liver but no focal hepatic lesions or intrahepatic biliary dilatation. The gallbladder is surgically absent. No common bile duct dilatation. Pancreas: No mass, inflammation or ductal dilatation. Spleen: Normal size.  No focal lesions.  Adrenals/Urinary Tract: The adrenal glands are normal. Both kidneys are normal. Stomach/Bowel: The stomach, duodenum, small bowel and colon are  grossly normal without oral contrast. No inflammatory changes, mass lesions or obstructive findings. The terminal ileum is normal. The appendix is normal. Vascular/Lymphatic: Moderate atherosclerotic calcifications involving the aorta and iliac arteries but no focal aneurysm or dissection. The branch vessels are patent. The major venous structures are patent. Other: The uterus demonstrates multiple small calcified fibroids. Both ovaries are normal. No pelvic mass or adenopathy. No free pelvic fluid collections. The bladder is normal. No inguinal mass or adenopathy. No abdominal wall hernia. Musculoskeletal: No significant bony findings. Moderate degenerative changes involving the lower lumbar spine. IMPRESSION: 1. No acute abdominal/pelvic findings, mass lesions or adenopathy. 2. Diffuse fatty infiltration of the liver. 3. Moderate atherosclerotic calcifications involving the aorta and branch vessels but no aneurysm or dissection. 4. Status post cholecystectomy.  No biliary dilatation. 5. Uterine fibroids. Electronically Signed   By: Marijo Sanes M.D.   On: 11/02/2015 10:49   Dg Chest Port 1 View  11/02/2015  CLINICAL DATA:  Chest pain and shortness of breath for 3 days. EXAM: PORTABLE CHEST 1 VIEW COMPARISON:  12/25/2014 FINDINGS: The cardiac silhouette, mediastinal and hilar contours are within normal limits and stable. The lungs are clear of acute process. Streaky basilar scarring changes. No pleural effusion. The bony thorax is intact. IMPRESSION: No acute cardiopulmonary findings. Electronically Signed   By: Marijo Sanes M.D.   On: 11/02/2015 08:29     Results for orders placed or performed during the hospital encounter of 11/02/15  CBC with Differential/Platelet  Result Value Ref Range   WBC 6.6 4.0 - 10.5 K/uL   RBC 4.64 3.87 - 5.11 MIL/uL   Hemoglobin  13.5 12.0 - 15.0 g/dL   HCT 40.0 36.0 - 46.0 %   MCV 86.2 78.0 - 100.0 fL   MCH 29.1 26.0 - 34.0 pg   MCHC 33.8 30.0 - 36.0 g/dL   RDW 14.4 11.5 - 15.5 %   Platelets 231 150 - 400 K/uL   Neutrophils Relative % 78 %   Neutro Abs 5.2 1.7 - 7.7 K/uL   Lymphocytes Relative 17 %   Lymphs Abs 1.1 0.7 - 4.0 K/uL   Monocytes Relative 4 %   Monocytes Absolute 0.3 0.1 - 1.0 K/uL   Eosinophils Relative 1 %   Eosinophils Absolute 0.0 0.0 - 0.7 K/uL   Basophils Relative 0 %   Basophils Absolute 0.0 0.0 - 0.1 K/uL  Comprehensive metabolic panel  Result Value Ref Range   Sodium 139 135 - 145 mmol/L   Potassium 4.2 3.5 - 5.1 mmol/L   Chloride 105 101 - 111 mmol/L   CO2 24 22 - 32 mmol/L   Glucose, Bld 160 (H) 65 - 99 mg/dL   BUN 6 6 - 20 mg/dL   Creatinine, Ser 0.82 0.44 - 1.00 mg/dL   Calcium 9.0 8.9 - 10.3 mg/dL   Total Protein 7.5 6.5 - 8.1 g/dL   Albumin 3.8 3.5 - 5.0 g/dL   AST 26 15 - 41 U/L   ALT 15 14 - 54 U/L   Alkaline Phosphatase 70 38 - 126 U/L   Total Bilirubin 0.5 0.3 - 1.2 mg/dL   GFR calc non Af Amer >60 >60 mL/min   GFR calc Af Amer >60 >60 mL/min   Anion gap 10 5 - 15  Lipase, blood  Result Value Ref Range   Lipase 40 11 - 51 U/L  D-dimer, quantitative (not at [facility])  Result Value Ref Range   D-Dimer, Quant 0.89 (H) 0.00 - 0.50  ug/mL-FEU  I-stat troponin, ED  Result Value Ref Range   Troponin i, poc 0.00 0.00 - 0.08 ng/mL   Comment 3          I-stat troponin, ED  Result Value Ref Range   Troponin i, poc 0.01 0.00 - 0.08 ng/mL   Comment 3            Dx with gastritis  Given protonix  Gave vicodin for severe pain  Given zofran for nausea   L shoulder is hurting  Pain is constant and it shoots down especially when she holds it down at her side   Still hurts in epigastric area Comes and goes overall - although improved some  Nausea medicine is helping   No dark stool  Constipated from her vicodin  No blood in stool   Does have burping and belching    Has f/u with GI on 1/17 - Dr Fuller Plan for f/u of this   Patient Active Problem List   Diagnosis Date Noted  . Gastritis 11/07/2015  . Chest pain 11/07/2015  . Fatty liver 11/07/2015  . Coronary atherosclerosis 11/07/2015  . Acute sinusitis 10/16/2015  . Acute upper respiratory infection 04/02/2015  . Left shoulder pain 12/25/2014  . Left-sided chest wall pain 12/25/2014  . Fatigue 04/24/2014  . Obesity 04/24/2014  . Encounter for Medicare annual wellness exam 04/24/2014  . Post herpetic neuralgia 07/09/2011  . Elevated blood pressure 07/09/2011  . Low back pain 07/09/2011  . Left sided abdominal pain 06/12/2011  . Rheumatoid arthritis ([place]) 07/23/2007  . GERD 07/09/2007  . HYPERCHOLESTEROLEMIA 03/19/2007  . PSVT 03/19/2007  . ALLERGIC RHINITIS 03/19/2007  . ENDOMETRIAL POLYP 03/19/2007  . POSTMENOPAUSAL STATUS 03/19/2007  . OSTEOARTHRITIS 03/19/2007  . SPINAL STENOSIS 03/19/2007  . MURMUR 03/19/2007  . NEPHROLITHIASIS, HX OF 03/19/2007   Past Medical History  Diagnosis Date  . RA (rheumatoid arthritis) ([place])   . Allergic rhinitis   . Osteoarthritis   . Gastritis   . Hyperlipemia   . Former smoker   . Hemorrhoids   . Personal history of colonic polyps 1998    hyperplastic  . Esophageal reflux   . Fibula fracture 02/2005  . History of nephrolithiasis   . Shingles 2012  . Anxiety   . Heart murmur   . Diverticulosis    Past Surgical History  Procedure Laterality Date  . Cholecystectomy    . Vaginal polyp excised  12/2000    benign  . Lipoma excision  11/2002  . Svt s/p surgery--? ablation    . Ep procedure  1994    SVT; normal echo  . Esophagogastroduodenoscopy  11/03    reactive gastropathy  . Colonoscopy  11/08    internal hemorrhoids  . Upper gastrointestinal endoscopy     Social History  Substance Use Topics  . Smoking status: Former Smoker    Quit date: 10/21/2003  . Smokeless tobacco: Never Used  . Alcohol Use: No   Family History  Problem  Relation Age of Onset  . Colon cancer Father 31  . Diabetes Mother   . Heart disease Mother   . Kidney disease Mother     ESRD  . Stroke Mother   . Hypertension Brother   . Breast cancer Sister   . Diabetes Other     nephews  . Colon cancer Sister   . Esophageal cancer Neg Hx   . Stomach cancer Neg Hx   . Rectal cancer Neg Hx  Allergies  Allergen Reactions  . Bee Venom Anaphylaxis  . Cetirizine Hcl     REACTION: does not work   Current Outpatient Prescriptions on File Prior to Visit  Medication Sig Dispense Refill  . albuterol (PROAIR HFA) 108 (90 BASE) MCG/ACT inhaler Inhale 2 puffs into the lungs every 4 (four) hours as needed.      . ALPRAZolam (XANAX) 0.25 MG tablet Take 1 tablet (0.25 mg total) by mouth daily as needed for anxiety (take only if needed ). 30 tablet 0  . Calcium & Magnesium Carbonates (MYLANTA PO) Take 1 capsule by mouth as directed.      . cyclobenzaprine (FLEXERIL) 10 MG tablet Take 0.5-1 tablets (5-10 mg total) by mouth 3 (three) times daily as needed for muscle spasms (watch for sedation). 30 tablet 0  . dicyclomine (BENTYL) 10 MG capsule TAKE 1 CAPSULE (10 MG TOTAL) BY MOUTH 2 (TWO) TIMES DAILY AS NEEDED (SPASMS AND CRAMPING). (Patient taking differently: Take 10 mg by mouth 2 (two) times daily as needed for spasms. ) 180 capsule 1  . EPINEPHrine (EPIPEN IJ) Inject as directed as needed.      . fluticasone (FLONASE) 50 MCG/ACT nasal spray PLACE 2 SPRAYS INTO THE NOSE DAILY. 16 g 3  . folic acid (FOLVITE) 1 MG tablet Take 1 mg by mouth daily.     Marland Kitchen loratadine (CLARITIN) 10 MG tablet Take 10 mg by mouth daily.    Marland Kitchen NEXIUM 40 MG capsule TAKE 1 CAPSULE BY MOUTH TWICE DAILY 60 capsule 5  . ondansetron (ZOFRAN ODT) 4 MG disintegrating tablet 4mg  ODT q4 hours prn nausea/vomit 12 tablet 0   No current facility-administered medications on file prior to visit.     Review of Systems Review of Systems  Constitutional: Negative for fever, appetite change,  and  unexpected weight change.  ENT neg for ST, pos for regurgitation of acid , neg for dysphagia  Eyes: Negative for pain and visual disturbance.  Respiratory: Negative for cough and shortness of breath.   Cardiovascular: Negative for  palpitations   neg for PND or orthopnea or pedal edema  Gastrointestinal: Negative for diarrhea/dark stool/ blood in stool  Genitourinary: Negative for urgency and frequency. neg for hematuria  Skin: Negative for pallor or rash   Neurological: Negative for weakness, light-headedness, numbness and headaches.  Hematological: Negative for adenopathy. Does not bruise/bleed easily.  Psychiatric/Behavioral: Negative for dysphoric mood. The patient is occ nervous/anxious.         Objective:   Physical Exam  Constitutional: She appears well-developed and well-nourished. No distress.  obese and well appearing   HENT:  Head: Normocephalic and atraumatic.  Mouth/Throat: Oropharynx is clear and moist.  Eyes: Conjunctivae and EOM are normal. Pupils are equal, round, and reactive to light. No scleral icterus.  Neck: Normal range of motion. Neck supple. No JVD present. Carotid bruit is not present. No thyromegaly present.  Cardiovascular: Normal rate, regular rhythm, normal heart sounds and intact distal pulses.  Exam reveals no gallop.   Pulmonary/Chest: Effort normal and breath sounds normal. No respiratory distress. She has no wheezes. She has no rales.  No crackles  Abdominal: Soft. Bowel sounds are normal. She exhibits no distension, no pulsatile liver, no abdominal bruit, no ascites, no pulsatile midline mass and no mass. There is no hepatosplenomegaly. There is tenderness in the right upper quadrant and epigastric area. There is no rigidity, no rebound, no guarding, no CVA tenderness, no tenderness at McBurney's point and negative Murphy's sign.  Musculoskeletal: She exhibits no edema.  Lymphadenopathy:    She has no cervical adenopathy.  Neurological: She is alert.  She has normal reflexes. No cranial nerve deficit. She exhibits normal muscle tone. Coordination normal.  Skin: Skin is warm and dry. No rash noted. No erythema. No pallor.  Psychiatric: She has a normal mood and affect.          Assessment & Plan:   Problem List Items Addressed This Visit      Cardiovascular and Mediastinum   Coronary atherosclerosis    This was seen incidentally on radiology studies in ED for abd/chest pain  Ref to cardiol Given info on low cholesterol diet  Lab Results  Component Value Date   CHOL 182 04/26/2014   HDL 49.00 04/26/2014   LDLCALC 90 04/26/2014   TRIG 215.0* 04/26/2014   CHOLHDL 4 04/26/2014         Relevant Orders   Ambulatory referral to Cardiology     Digestive   Fatty liver    Seen on [place] and CT incidentally  LFTs are ok  Disc imp of low fat diet and wt loss with pt  Disc plan for this       Gastritis - Primary    Rev ED notes from 1/13  Tender in epigastrium No imp with addn of protonix or vicodin zofran helps with nausea  Will continue bid nexium  Add zantac 150 bid  Also given info on low acid diet to follow - (she thinks tomato sauce could have exac this) No nsaids  Watch for dark stool/ etc  Rev CT and [place]  For GI f/u feb as planned - unsure if she will need EGD        Other   Chest pain    Mostly epigastric with some chest burning and heartburn Rev ED w/u - imaging  Nl EKG  Did see some evidence of atherosclerosis on CT /cxr-ref to cardiol  Rev low fat/cholesterol diet   Update if sob or if symptoms change       Relevant Orders   Ambulatory referral to Cardiology   Obesity    Now with fatty liver  Rev low fat /calorie diet   Discussed how this problem influences overall health and the risks it imposes  Reviewed plan for weight loss with lower calorie diet (via better food choices and also portion control or program like weight watchers) and exercise building up to or more than 30 minutes 5 days per week  including some aerobic activity

## 2015-11-07 NOTE — Patient Instructions (Signed)
I think you have gastritis (after reviewing your ER notes and studies) Stop protonix since it is not helping  Stop vicodin- it is just going to make you constipated   Continue nexium twice daily Eat a low acid and low fat diet (you also have fatty liver and you need to loose weight) Add ranitidine 150 mg twice daily (this is the generic of zantac)   If symptoms worsen please let me know  Stop at check out for referral to cardiology to check out your heart (there was evidence of cholesterol deposits in your arteries)  Eat  A low cholesterol diet  (Avoid red meat/ fried foods/ egg yolks/ fatty breakfast meats/ butter, cheese and high fat dairy/ and shellfish)

## 2015-11-07 NOTE — Assessment & Plan Note (Signed)
Now with fatty liver  Rev low fat /calorie diet   Discussed how this problem influences overall health and the risks it imposes  Reviewed plan for weight loss with lower calorie diet (via better food choices and also portion control or program like weight watchers) and exercise building up to or more than 30 minutes 5 days per week including some aerobic activity

## 2015-11-07 NOTE — Assessment & Plan Note (Signed)
Seen on [place] and CT incidentally  LFTs are ok  Disc imp of low fat diet and wt loss with pt  Disc plan for this

## 2015-11-07 NOTE — Assessment & Plan Note (Signed)
Rev ED notes from 1/13  Tender in epigastrium No imp with addn of protonix or vicodin zofran helps with nausea  Will continue bid nexium  Add zantac 150 bid  Also given info on low acid diet to follow - (she thinks tomato sauce could have exac this) No nsaids  Watch for dark stool/ etc  Rev CT and [place]  For GI f/u feb as planned - unsure if she will need EGD

## 2015-11-16 ENCOUNTER — Other Ambulatory Visit: Payer: Self-pay

## 2015-11-16 MED ORDER — ONDANSETRON 4 MG PO TBDP: ORAL_TABLET | ORAL | Status: DC

## 2015-11-16 NOTE — Telephone Encounter (Signed)
Pt left v/m requesting refill ondansetron to [facility]. Last seen 11/07/15 and rx last refilled # 12 on 11/02/15.Please advise.

## 2015-11-16 NOTE — Telephone Encounter (Signed)
Will refill electronically  

## 2015-12-05 ENCOUNTER — Encounter: Payer: Self-pay | Admitting: Gastroenterology

## 2015-12-05 ENCOUNTER — Ambulatory Visit (INDEPENDENT_AMBULATORY_CARE_PROVIDER_SITE_OTHER): Payer: Medicare Other | Admitting: Gastroenterology

## 2015-12-05 VITALS — BP 146/60 | HR 84 | Ht 67.0 in | Wt 209.0 lb

## 2015-12-05 DIAGNOSIS — R079 Chest pain, unspecified: Secondary | ICD-10-CM

## 2015-12-05 DIAGNOSIS — K5900 Constipation, unspecified: Secondary | ICD-10-CM | POA: Diagnosis not present

## 2015-12-05 DIAGNOSIS — R1013 Epigastric pain: Secondary | ICD-10-CM | POA: Diagnosis not present

## 2015-12-05 DIAGNOSIS — K219 Gastro-esophageal reflux disease without esophagitis: Secondary | ICD-10-CM

## 2015-12-05 NOTE — Patient Instructions (Signed)
Take Tylenol three times a day for your chest and rib pain.  Follow up with Dr. Glori Bickers and [place] as needed.   Thank you for choosing me and [facility].  Pricilla Riffle. Dagoberto Ligas., MD., Marval Regal

## 2015-12-05 NOTE — Progress Notes (Signed)
    History of Present Illness: This is 5 her old female complaining of lower chest pain and epigastric pain for the past several weeks. She is accompanied by her husband. She was evaluated in the ED on Jan 13th for these symptoms with a chest/abd/pelvic CT scan negative for acute findings and unremarkable blood work. She was prescribed Protonix, Vicodin and Phenergan. She notes a flare in reflux symptoms recently as well. She was started on dicyclomine without change in symptoms. Her lower chest pain and epigastric pain have no relation to meals and her present almost all the time that worsens at various times during the day without clear triggers. She has noted mild constipation recently. EGD performed in 2000 was normal. Colonoscopy performed in March 2014 showed diverticulosis and internal hemorrhoids.  Current Medications, Allergies, Past Medical History, Past Surgical History, Family History and Social History were reviewed in [employer] record.  Physical Exam: General: Well developed, well nourished, no acute distress Head: Normocephalic and atraumatic Eyes:  sclerae anicteric, EOMI Ears: Normal auditory acuity Mouth: No deformity or lesions Lungs: Clear throughout to auscultation Chest: Marked lower sternum and xiphoid process tenderness Heart: Regular rate and rhythm; no murmurs, rubs or bruits Abdomen: Soft, mild epigastric tenderness and non distended. No masses, hepatosplenomegaly or hernias noted. Normal Bowel sounds Musculoskeletal: Symmetrical with no gross deformities  Pulses:  Normal pulses noted Extremities: No clubbing, cyanosis, edema or deformities noted Neurological: Alert oriented x 4, grossly nonfocal Psychological:  Alert and cooperative. Normal mood and affect  Assessment and Recommendations:  1. Marked lower sternum and xiphoid process tenderness. Possible costochondritis. Begin Tylenol 3 times a day for the next week and if this is not  effective change to Advil 600 mg 3 times a day for one week. Reserve Vicodin for moderate to severe pain. Further follow-up and management per Dr. Glori Bickers.  2. Epigastric pain which is most musculoskeletal/abdominal wall pain referred from lower sternum and xiphoid process. She also is having a mild reflux flare. Continue Nexium 40 mg twice daily, ranitidine 300 mg at bedtime and follow all standard antireflux measures. Discontinue dicyclomine.  3. Mild constipation. Increase daily water intake and fiber intake. MiraLAX once daily as needed.

## 2015-12-09 ENCOUNTER — Other Ambulatory Visit: Payer: Self-pay | Admitting: Family Medicine

## 2015-12-10 NOTE — Telephone Encounter (Signed)
Please refill times 3  Please ask pt if she mentioned the nausea to the GI doctor  Thanks

## 2015-12-10 NOTE — Telephone Encounter (Signed)
Pt notified of Dr. Marliss Coots comments. She has told her GI doc about nausea too

## 2015-12-10 NOTE — Telephone Encounter (Signed)
Pt needs to get a rx refill on her Ondansetron.  Please call to let patient know if we can call this in as soon as possible.

## 2015-12-14 ENCOUNTER — Encounter: Payer: Self-pay | Admitting: Internal Medicine

## 2015-12-14 ENCOUNTER — Ambulatory Visit (INDEPENDENT_AMBULATORY_CARE_PROVIDER_SITE_OTHER): Payer: Medicare Other | Admitting: Internal Medicine

## 2015-12-14 VITALS — BP 166/82 | HR 80 | Ht 67.0 in | Wt 211.8 lb

## 2015-12-14 DIAGNOSIS — I2510 Atherosclerotic heart disease of native coronary artery without angina pectoris: Secondary | ICD-10-CM

## 2015-12-14 DIAGNOSIS — I2584 Coronary atherosclerosis due to calcified coronary lesion: Secondary | ICD-10-CM | POA: Diagnosis not present

## 2015-12-14 DIAGNOSIS — R079 Chest pain, unspecified: Secondary | ICD-10-CM

## 2015-12-14 LAB — LIPID PANEL
CHOL/HDL RATIO: 3.4 ratio (ref <=5.0)
CHOLESTEROL: 165 mg/dL (ref 125–200)
HDL: 49 mg/dL (ref >=46)
LDL Cholesterol: 78 mg/dL (ref <130)
Triglycerides: 189 mg/dL — ABNORMAL HIGH (ref <150)
VLDL: 38 mg/dL — ABNORMAL HIGH (ref <30)

## 2015-12-14 LAB — HEMOGLOBIN A1C
HEMOGLOBIN A1C: 6.6 % — AB (ref <5.7)
MEAN PLASMA GLUCOSE: 143 mg/dL — AB (ref <117)

## 2015-12-14 LAB — TSH: TSH: 0.54 mIU/L

## 2015-12-14 NOTE — Patient Instructions (Signed)
Your physician recommends that you continue on your current medications as directed. Please refer to the Current Medication list given to you today. Your physician recommends that you return for lab work in: today (SED RATE, HGA1C, LIPIDS, TSH)  Your physician wants you to follow-up in: November, 2017 WITH DR. Harrington Challenger.  You will receive a reminder letter in the mail two months in advance. If you don't receive a letter, please call our office to schedule the follow-up appointment.

## 2015-12-14 NOTE — Progress Notes (Signed)
Cardiology Office Note   Date:  12/14/2015   ID:  [name], [name] May 14, 1941, MRN [id number]  PCP:  Loura Pardon, MD  Cardiologist:   Dorris Carnes, MD   Pt presents for CP eval  Referred by Thea Alken     History of Present Illness: [name] is a 75 y.o. female with a history of CP, CAD, gastritis  Followed by Thea Alken CT of chest done  Ths showed scattered calcifications of gr vessels.   Seen in GI by Barth Kirks. Pt had sternal/xiphoid tenderness Felt musculoskeletal  Rx tylenol/advil  Also recomm Nexium for reflux    Since seen,  pain comes and goes  Takes tylenol  Helps the pain Pt says it is Not as bad Taking nexium  Reflux better    Not too active   Outpatient Prescriptions Prior to Visit  Medication Sig Dispense Refill  . ALPRAZolam (XANAX) 0.25 MG tablet Take 1 tablet (0.25 mg total) by mouth daily as needed for anxiety (take only if needed ). 30 tablet 0  . Calcium & Magnesium Carbonates (MYLANTA PO) Take 1 capsule by mouth as directed.      . dicyclomine (BENTYL) 10 MG capsule TAKE 1 CAPSULE (10 MG TOTAL) BY MOUTH 2 (TWO) TIMES DAILY AS NEEDED (SPASMS AND CRAMPING). (Patient taking differently: Take 10 mg by mouth 2 (two) times daily as needed for spasms. ) 180 capsule 1  . EPINEPHrine (EPIPEN IJ) Inject as directed as needed. Reported on Q000111Q    . folic acid (FOLVITE) 1 MG tablet Take 1 mg by mouth daily.     Marland Kitchen loratadine (CLARITIN) 10 MG tablet Take 10 mg by mouth daily.    . methotrexate (RHEUMATREX) 2.5 MG tablet Take 10 mg by mouth once a week. Caution:Chemotherapy. Protect from light.    Marland Kitchen NEXIUM 40 MG capsule TAKE 1 CAPSULE BY MOUTH TWICE DAILY 60 capsule 5  . ranitidine (ZANTAC) 150 MG capsule Take 1 capsule (150 mg total) by mouth 2 (two) times daily. 60 capsule 11  . albuterol (PROAIR HFA) 108 (90 BASE) MCG/ACT inhaler Inhale 2 puffs into the lungs every 4 (four) hours as needed. Reported on 12/14/2015    . cyclobenzaprine (FLEXERIL) 10 MG tablet Take  0.5-1 tablets (5-10 mg total) by mouth 3 (three) times daily as needed for muscle spasms (watch for sedation). (Patient not taking: Reported on 12/14/2015) 30 tablet 0  . fluticasone (FLONASE) 50 MCG/ACT nasal spray PLACE 2 SPRAYS INTO THE NOSE DAILY. (Patient not taking: Reported on 12/14/2015) 16 g 3  . HYDROcodone-acetaminophen (NORCO/VICODIN) 5-325 MG tablet Take 1 tablet by mouth every 6 (six) hours as needed for moderate pain. Reported on 12/14/2015    . ondansetron (ZOFRAN-ODT) 4 MG disintegrating tablet TAKE 1 TABLET BY MOUTH EVERY 4 HOURS AS NEEDED FOR NAUSEA/VOMIT (Patient not taking: Reported on 12/14/2015) 20 tablet 2   No facility-administered medications prior to visit.     Allergies:   Bee venom and Cetirizine hcl   Past Medical History  Diagnosis Date  . RA (rheumatoid arthritis) ([place])   . Allergic rhinitis   . Osteoarthritis   . Gastritis   . Hyperlipemia   . Former smoker   . Hemorrhoids   . Personal history of colonic polyps 1998    hyperplastic  . Esophageal reflux   . Fibula fracture 02/2005  . History of nephrolithiasis   . Shingles 2012  . Anxiety   . Heart murmur   . Diverticulosis  Past Surgical History  Procedure Laterality Date  . Cholecystectomy    . Vaginal polyp excised  12/2000    benign  . Lipoma excision  11/2002  . Svt s/p surgery--? ablation    . Ep procedure  1994    SVT; normal echo  . Esophagogastroduodenoscopy  11/03    reactive gastropathy  . Colonoscopy  11/08    internal hemorrhoids  . Upper gastrointestinal endoscopy       Social History:  The patient  reports that she quit smoking about 12 years ago. She has never used smokeless tobacco. She reports that she does not drink alcohol or use illicit drugs.   Family History:  The patient's family history includes Breast cancer in her sister; Colon cancer in her sister; Colon cancer (age of onset: 38) in her father; Diabetes in her mother and other; Heart disease in her mother;  Hypertension in her brother; Kidney disease in her mother; Stroke in her mother. There is no history of Esophageal cancer, Stomach cancer, or Rectal cancer.    ROS:  Please see the history of present illness. All other systems are reviewed and  Negative to the above problem except as noted.    PHYSICAL EXAM: VS:  BP 166/82 mmHg  Pulse 80  Ht 5\' 7"  (1.702 m)  Wt 211 lb 12.8 oz (96.072 kg)  BMI 33.16 kg/m2  SpO2 97%  GEN: Well nourished, well developed, in no acute distress HEENT: normal Neck: no JVD, carotid bruits, or masses Cardiac: RRR; no murmurs, rubs, or gallops,no edema  Respiratory:  clear to auscultation bilaterally, normal work of breathing GI: soft, nontender, nondistended, + BS  No hepatomegaly  MS: no deformity Moving all extremities   Skin: warm and dry, no rash Neuro:  Strength and sensation are intact Psych: euthymic mood, full affect   EKG:  EKG is not ordered today.On 1/13  SR 87 bpm     Lipid Panel    Component Value Date/Time   CHOL 182 04/26/2014 1132   TRIG 215.0* 04/26/2014 1132   HDL 49.00 04/26/2014 1132   CHOLHDL 4 04/26/2014 1132   VLDL 43.0* 04/26/2014 1132   LDLCALC 90 04/26/2014 1132      Wt Readings from Last 3 Encounters:  12/14/15 211 lb 12.8 oz (96.072 kg)  12/05/15 209 lb (94.802 kg)  11/07/15 211 lb 8 oz (95.936 kg)      ASSESSMENT AND PLAN:  1  CHest pain  Atypical for cardiac  Appears to be more musculoskeletal I would continue to treat with tylenol  Can try motirn Check WESR   2  CAD  Pt with coronary calcifications  I am not convinced symptoms reflect angina  Check lipids    2  HCM  Will set up for lipds and Hgb A1C  Disposition:   FU with  Me in Novmeber    Signed, Dorris Carnes, MD  12/14/2015 11:47 AM    [id number], [place], [place]  [address] Phone: [phone]; Fax: [phone]

## 2015-12-15 LAB — SEDIMENTATION RATE: SED RATE: 32 mm/h — AB (ref 0–30)

## 2016-01-03 ENCOUNTER — Ambulatory Visit (INDEPENDENT_AMBULATORY_CARE_PROVIDER_SITE_OTHER): Payer: Medicare Other

## 2016-01-03 VITALS — BP 142/98 | HR 67 | Temp 97.8°F | Wt 199.8 lb

## 2016-01-03 DIAGNOSIS — Z23 Encounter for immunization: Secondary | ICD-10-CM

## 2016-01-03 DIAGNOSIS — Z0000 Encounter for general adult medical examination without abnormal findings: Secondary | ICD-10-CM | POA: Diagnosis not present

## 2016-01-03 NOTE — Progress Notes (Signed)
Pre visit review using our clinic review tool, if applicable. No additional management support is needed unless otherwise documented below in the visit note. 

## 2016-01-03 NOTE — Patient Instructions (Signed)
[name] , Thank you for taking time to come for your Medicare Wellness Visit. I appreciate your ongoing commitment to your health goals. Please review the following plan we discussed and let me know if I can assist you in the future.   These are the goals we discussed: Goals    . Increase physical activity     When weather permits, I will walk daily for 15 minutes.        This is a list of the screening recommended for you and due dates:  Health Maintenance  Topic Date Due  . Shingles Public librarian  . Mammogram  12/29/2014  . Pneumonia vaccines (2 of 2 - PCV13) 01/03/2016  . Flu Shot  05/20/2016  . Tetanus Vaccine  09/04/2016  . Colon Cancer Screening  12/23/2017  . DEXA scan (bone density measurement)  Completed   Preventive Care for Adults  A healthy lifestyle and preventive care can promote health and wellness. Preventive health guidelines for adults include the following key practices.  . A routine yearly physical is a good way to check with your health care provider about your health and preventive screening. It is a chance to share any concerns and updates on your health and to receive a thorough exam.  . Visit your dentist for a routine exam and preventive care every 6 months. Brush your teeth twice a day and floss once a day. Good oral hygiene prevents tooth decay and gum disease.  . The frequency of eye exams is based on your age, health, family medical history, use  of contact lenses, and other factors. Follow your health care provider's ecommendations for frequency of eye exams.  . Eat a healthy diet. Foods like vegetables, fruits, whole grains, low-fat dairy products, and lean protein foods contain the nutrients you need without too many calories. Decrease your intake of foods high in solid fats, added sugars, and salt. Eat the right amount of calories for you. Get information about a proper diet from your health care provider, if necessary.  .  Regular physical exercise is one of the most important things you can do for your health. Most adults should get at least 150 minutes of moderate-intensity exercise (any activity that increases your heart rate and causes you to sweat) each week. In addition, most adults need muscle-strengthening exercises on 2 or more days a week.  Silver Sneakers may be a benefit available to you. To determine eligibility, you may visit the website: www.silversneakers.com or contact program at (435)195-2869 Mon-Fri between 8AM-8PM.   . Maintain a healthy weight. The body mass index (BMI) is a screening tool to identify possible weight problems. It provides an estimate of body fat based on height and weight. Your health care provider can find your BMI and can help you achieve or maintain a healthy weight.   For adults 20 years and older: ? A BMI below 18.5 is considered underweight. ? A BMI of 18.5 to 24.9 is normal. ? A BMI of 25 to 29.9 is considered overweight. ? A BMI of 30 and above is considered obese.   . Maintain normal blood lipids and cholesterol levels by exercising and minimizing your intake of saturated fat. Eat a balanced diet with plenty of fruit and vegetables. Blood tests for lipids and cholesterol should begin at age 61 and be repeated every 5 years. If your lipid or cholesterol levels are high, you are over 50, or you are at high risk for heart disease,  you may need your cholesterol levels checked more frequently. Ongoing high lipid and cholesterol levels should be treated with medicines if diet and exercise are not working.  . If you smoke, find out from your health care provider how to quit. If you do not use tobacco, please do not start.  . If you choose to drink alcohol, please do not consume more than 2 drinks per day. One drink is considered to be 12 ounces (355 mL) of beer, 5 ounces (148 mL) of wine, or 1.5 ounces (44 mL) of liquor.  . If you are 67-10 years old, ask your health care  provider if you should take aspirin to prevent strokes.  . Use sunscreen. Apply sunscreen liberally and repeatedly throughout the day. You should seek shade when your shadow is shorter than you. Protect yourself by wearing long sleeves, pants, a wide-brimmed hat, and sunglasses year round, whenever you are outdoors.  . Once a month, do a whole body skin exam, using a mirror to look at the skin on your back. Tell your health care provider of new moles, moles that have irregular borders, moles that are larger than a pencil eraser, or moles that have changed in shape or color.

## 2016-01-03 NOTE — Progress Notes (Signed)
Subjective:   [name] is a 75 y.o. female who presents for Medicare Annual (Subsequent) preventive examination.   Cardiac Risk Factors include: advanced age (>66men, >41 women);dyslipidemia;hypertension;obesity (BMI >30kg/m2)     Objective:     Vitals: BP 142/98 mmHg  Pulse 67  Temp(Src) 97.8 F (36.6 C) (Oral)  Wt 199 lb 12 oz (90.606 kg)  SpO2 93%  Tobacco History  Smoking status  . Former Smoker  . Quit date: 10/21/2003  Smokeless tobacco  . Never Used     Counseling given: No   Past Medical History  Diagnosis Date  . RA (rheumatoid arthritis) ([place])   . Allergic rhinitis   . Osteoarthritis   . Gastritis   . Hyperlipemia   . Former smoker   . Hemorrhoids   . Personal history of colonic polyps 1998    hyperplastic  . Esophageal reflux   . Fibula fracture 02/2005  . History of nephrolithiasis   . Shingles 2012  . Anxiety   . Heart murmur   . Diverticulosis    Past Surgical History  Procedure Laterality Date  . Cholecystectomy    . Vaginal polyp excised  12/2000    benign  . Lipoma excision  11/2002  . Svt s/p surgery--? ablation    . Ep procedure  1994    SVT; normal echo  . Esophagogastroduodenoscopy  11/03    reactive gastropathy  . Colonoscopy  11/08    internal hemorrhoids  . Upper gastrointestinal endoscopy     Family History  Problem Relation Age of Onset  . Colon cancer Father 32  . Diabetes Mother   . Heart disease Mother   . Kidney disease Mother     ESRD  . Stroke Mother   . Hypertension Brother   . Breast cancer Sister   . Diabetes Other     nephews  . Colon cancer Sister   . Esophageal cancer Neg Hx   . Stomach cancer Neg Hx   . Rectal cancer Neg Hx    History  Sexual Activity  . Sexual Activity: Yes    Outpatient Encounter Prescriptions as of 01/03/2016  Medication Sig  . ALPRAZolam (XANAX) 0.25 MG tablet Take 1 tablet (0.25 mg total) by mouth daily as needed for anxiety (take only if needed ).  . Calcium &  Magnesium Carbonates (MYLANTA PO) Take 1 capsule by mouth as directed.    . dicyclomine (BENTYL) 10 MG capsule TAKE 1 CAPSULE (10 MG TOTAL) BY MOUTH 2 (TWO) TIMES DAILY AS NEEDED (SPASMS AND CRAMPING). (Patient taking differently: Take 10 mg by mouth 2 (two) times daily as needed for spasms. )  . EPINEPHrine (EPIPEN IJ) Inject as directed as needed. Reported on Q000111Q  . folic acid (FOLVITE) 1 MG tablet Take 1 mg by mouth daily.   Marland Kitchen loratadine (CLARITIN) 10 MG tablet Take 10 mg by mouth daily.  . methotrexate (RHEUMATREX) 2.5 MG tablet Take 10 mg by mouth once a week. Caution:Chemotherapy. Protect from light.  Marland Kitchen NEXIUM 40 MG capsule TAKE 1 CAPSULE BY MOUTH TWICE DAILY  . ranitidine (ZANTAC) 150 MG capsule Take 1 capsule (150 mg total) by mouth 2 (two) times daily.   No facility-administered encounter medications on file as of 01/03/2016.    Activities of Daily Living In your present state of health, do you have any difficulty performing the following activities: 01/03/2016  Hearing? N  Vision? N  Difficulty concentrating or making decisions? N  Walking or climbing stairs?  N  Dressing or bathing? N  Doing errands, shopping? N  Preparing Food and eating ? N  Using the Toilet? N  In the past six months, have you accidently leaked urine? N  Do you have problems with loss of bowel control? N  Managing your Medications? N  Managing your Finances? N  Housekeeping or managing your Housekeeping? N    Patient Care Team: Abner Greenspan, MD as PCP - General  Dr. Gloriann Loan - Optometry    Assessment:    Hearing Screening   125Hz  250Hz  500Hz  1000Hz  2000Hz  4000Hz  8000Hz   Right ear:   40 40 40 40   Left ear:   0 0 40 0   Vision Screening Comments: Last eye exam approx. 1 yr ago with Dr. Gloriann Loan   Exercise Activities and Dietary recommendations Current Exercise Habits: Home exercise routine, Type of exercise: walking, Time (Minutes): 30, Frequency (Times/Week): 2, Weekly Exercise (Minutes/Week): 60,  Intensity: Mild, Exercise limited by: None identified  Goals    . Increase physical activity     When weather permits, I will walk daily for 15 minutes.       Fall Risk Fall Risk  01/03/2016 04/24/2014  Falls in the past year? No No   Depression Screen PHQ 2/9 Scores 01/03/2016 04/24/2014  PHQ - 2 Score 0 0     Cognitive Testing MMSE - Mini Mental State Exam 01/03/2016  Orientation to time 5  Orientation to Place 5  Registration 3  Attention/ Calculation 5  Recall 3  Language- name 2 objects 0  Language- repeat 1  Language- follow 3 step command 3  Language- read & follow direction 1  Write a sentence 0  Copy design 0  Total score 26    Immunization History  Administered Date(s) Administered  . Influenza,inj,Quad PF,36+ Mos 08/10/2015  . Pneumococcal Conjugate-13 01/03/2016  . Pneumococcal Polysaccharide-23 04/24/2014  . Td 09/04/2006   Screening Tests Health Maintenance  Topic Date Due  . MAMMOGRAM  07/20/2016 (Originally 12/29/2014) - pt will schedule  . ZOSTAVAX  12/18/2016 (Originally 05/04/2002) - pt will check with insurance; also hx of shingles  . INFLUENZA VACCINE  05/20/2016  . TETANUS/TDAP  09/04/2016  . COLONOSCOPY  12/23/2017  . DEXA SCAN  Completed  . PNA vac Low Risk Adult  Completed      Plan:      I have personally reviewed the Medicare Annual Wellness questionnaire and have noted the following in the patient's chart:  A. Medical and social history B. Use of alcohol, tobacco or illicit drugs  C. Current medications and supplements D. Functional ability and status E.  Nutritional status F.  Physical activity G. Advance directives H. List of other physicians I.  Hospitalizations, surgeries, and ER visits in previous 12 months J.  [place] to include hearing, vision, cognitive, depression L. Referrals and appointments - none  In addition, I reviewed preventive protocols, quality metrics, and best practice recommendations specific to  patient. A written personalized care plan for preventive services as well as general preventive health recommendations were provided to patient.  See attached scanned questionnaire for additional information.   Signed,   Lindell Noe, MHA, BS, LPN Health Advisor [id number]

## 2016-01-03 NOTE — Progress Notes (Signed)
   Subjective:    Patient ID: [name], female    DOB: 1941-07-18, 75 y.o.   MRN: [id number]  HPI    Review of Systems     Objective:   Physical Exam        Assessment & Plan:  I reviewed health advisor's note, was available for consultation, and agree with documentation and plan.

## 2016-01-03 NOTE — Progress Notes (Signed)
Patient concerns during AWV:  Pt had elevated BP of 142/98 (right) and 150/98 (left). Pt states she believes her BP was elevated due to diet. Pt did not report or exhibit any abnormal symptoms and stated she felt fine. Allie Bossier, NP, was consulted about BP during AWV. Pt was encouraged to monitor and to lower sodium intake. An acute visit was scheduled with PCP on 01/07/16 @ 1015AM.

## 2016-01-07 ENCOUNTER — Ambulatory Visit (INDEPENDENT_AMBULATORY_CARE_PROVIDER_SITE_OTHER): Payer: Medicare Other | Admitting: Family Medicine

## 2016-01-07 ENCOUNTER — Encounter: Payer: Self-pay | Admitting: Family Medicine

## 2016-01-07 VITALS — BP 140/80 | HR 67 | Temp 97.4°F | Ht 67.0 in | Wt 211.8 lb

## 2016-01-07 DIAGNOSIS — I10 Essential (primary) hypertension: Secondary | ICD-10-CM | POA: Diagnosis not present

## 2016-01-07 HISTORY — DX: Essential (primary) hypertension: I10

## 2016-01-07 MED ORDER — ONDANSETRON 4 MG PO TBDP: ORAL_TABLET | ORAL | Status: DC

## 2016-01-07 MED ORDER — HYDROCHLOROTHIAZIDE 25 MG PO TABS: 25.0000 mg | ORAL_TABLET | Freq: Every day | ORAL | Status: DC

## 2016-01-07 NOTE — Patient Instructions (Signed)
Eat less sodium  Work on weight loss Try to exercise more Drink more water Start hctz 25 mg one pill each am for high blood pressure  Follow up in about 1 month

## 2016-01-07 NOTE — Progress Notes (Signed)
Subjective:    Patient ID: [name], female    DOB: Jan 22, 1941, 75 y.o.   MRN: [id number]  HPI Here with elevated bp  She had eaten some "pigs feet"   Was 140/80-when it was checked here  BP Readings from Last 3 Encounters:  01/07/16 136/76  01/03/16 142/98  12/14/15 166/82   no headaches/edema/cp/sob or other cv symptoms    Last wt reading was not correct  Wt is stable bmi of 37   Mother has high bp   She walks for exercise and does some in the house  2 times per week   Some [place] sausage and gravy   Patient Active Problem List   Diagnosis Date Noted  . Gastritis 11/07/2015  . Chest pain 11/07/2015  . Fatty liver 11/07/2015  . Coronary atherosclerosis 11/07/2015  . Acute sinusitis 10/16/2015  . Acute upper respiratory infection 04/02/2015  . Left shoulder pain 12/25/2014  . Left-sided chest wall pain 12/25/2014  . Fatigue 04/24/2014  . Obesity 04/24/2014  . Encounter for Medicare annual wellness exam 04/24/2014  . Post herpetic neuralgia 07/09/2011  . Elevated blood pressure 07/09/2011  . Low back pain 07/09/2011  . Left sided abdominal pain 06/12/2011  . Rheumatoid arthritis ([place]) 07/23/2007  . GERD 07/09/2007  . HYPERCHOLESTEROLEMIA 03/19/2007  . PSVT 03/19/2007  . ALLERGIC RHINITIS 03/19/2007  . ENDOMETRIAL POLYP 03/19/2007  . POSTMENOPAUSAL STATUS 03/19/2007  . OSTEOARTHRITIS 03/19/2007  . SPINAL STENOSIS 03/19/2007  . MURMUR 03/19/2007  . NEPHROLITHIASIS, HX OF 03/19/2007   Past Medical History  Diagnosis Date  . RA (rheumatoid arthritis) ([place])   . Allergic rhinitis   . Osteoarthritis   . Gastritis   . Hyperlipemia   . Former smoker   . Hemorrhoids   . Personal history of colonic polyps 1998    hyperplastic  . Esophageal reflux   . Fibula fracture 02/2005  . History of nephrolithiasis   . Shingles 2012  . Anxiety   . Heart murmur   . Diverticulosis    Past Surgical History  Procedure Laterality Date  . Cholecystectomy    .  Vaginal polyp excised  12/2000    benign  . Lipoma excision  11/2002  . Svt s/p surgery--? ablation    . Ep procedure  1994    SVT; normal echo  . Esophagogastroduodenoscopy  11/03    reactive gastropathy  . Colonoscopy  11/08    internal hemorrhoids  . Upper gastrointestinal endoscopy     Social History  Substance Use Topics  . Smoking status: Former Smoker    Quit date: 10/21/2003  . Smokeless tobacco: Never Used  . Alcohol Use: No   Family History  Problem Relation Age of Onset  . Colon cancer Father 4  . Diabetes Mother   . Heart disease Mother   . Kidney disease Mother     ESRD  . Stroke Mother   . Hypertension Brother   . Breast cancer Sister   . Diabetes Other     nephews  . Colon cancer Sister   . Esophageal cancer Neg Hx   . Stomach cancer Neg Hx   . Rectal cancer Neg Hx    Allergies  Allergen Reactions  . Bee Venom Anaphylaxis  . Cetirizine Hcl     REACTION: does not work   Current Outpatient Prescriptions on File Prior to Visit  Medication Sig Dispense Refill  . ALPRAZolam (XANAX) 0.25 MG tablet Take 1 tablet (0.25 mg total) by mouth  daily as needed for anxiety (take only if needed ). 30 tablet 0  . Calcium & Magnesium Carbonates (MYLANTA PO) Take 1 capsule by mouth as directed.      . dicyclomine (BENTYL) 10 MG capsule TAKE 1 CAPSULE (10 MG TOTAL) BY MOUTH 2 (TWO) TIMES DAILY AS NEEDED (SPASMS AND CRAMPING). (Patient taking differently: Take 10 mg by mouth 2 (two) times daily as needed for spasms. ) 180 capsule 1  . EPINEPHrine (EPIPEN IJ) Inject as directed as needed. Reported on Q000111Q    . folic acid (FOLVITE) 1 MG tablet Take 1 mg by mouth daily.     Marland Kitchen loratadine (CLARITIN) 10 MG tablet Take 10 mg by mouth daily.    . methotrexate (RHEUMATREX) 2.5 MG tablet Take 10 mg by mouth once a week. Caution:Chemotherapy. Protect from light.    Marland Kitchen NEXIUM 40 MG capsule TAKE 1 CAPSULE BY MOUTH TWICE DAILY 60 capsule 5  . ranitidine (ZANTAC) 150 MG capsule Take  1 capsule (150 mg total) by mouth 2 (two) times daily. 60 capsule 11   No current facility-administered medications on file prior to visit.     Review of Systems Review of Systems  Constitutional: Negative for fever, appetite change, fatigue and unexpected weight change.  Eyes: Negative for pain and visual disturbance.  Respiratory: Negative for cough and shortness of breath.   Cardiovascular: Negative for cp or palpitations    Gastrointestinal: Negative for nausea, diarrhea and constipation.  Genitourinary: Negative for urgency and frequency.  Skin: Negative for pallor or rash   MSK pos for baseline joint pain  Neurological: Negative for weakness, light-headedness, numbness and headaches.  Hematological: Negative for adenopathy. Does not bruise/bleed easily.  Psychiatric/Behavioral: Negative for dysphoric mood. The patient is not nervous/anxious.         Objective:   Physical Exam  Constitutional: She appears well-developed and well-nourished. No distress.  obese and well appearing   HENT:  Head: Normocephalic and atraumatic.  Mouth/Throat: Oropharynx is clear and moist.  Eyes: Conjunctivae and EOM are normal. Pupils are equal, round, and reactive to light.  Neck: Normal range of motion. Neck supple. No JVD present. Carotid bruit is not present. No thyromegaly present.  Cardiovascular: Normal rate, regular rhythm and intact distal pulses.  Exam reveals no gallop.   Murmur heard. Pulmonary/Chest: Effort normal and breath sounds normal. No respiratory distress. She has no wheezes. She has no rales.  No crackles  Abdominal: Soft. Bowel sounds are normal. She exhibits no distension, no abdominal bruit and no mass. There is no tenderness.  Musculoskeletal: She exhibits no edema.  Lymphadenopathy:    She has no cervical adenopathy.  Neurological: She is alert. She has normal reflexes.  Skin: Skin is warm and dry. No rash noted.  Psychiatric: She has a normal mood and affect.           Assessment & Plan:   Problem List Items Addressed This Visit      Cardiovascular and Mediastinum   Essential hypertension - Primary    Elevated bp last 3 visits in obese female with family hx  Given handout on DASH eating plan and HTN Start hctz 25 mg -disc poss side eff F/u 1 mo visit and labs Disc goals for bp   Last labs reviewed  Enc wt loss       Relevant Medications   hydrochlorothiazide (HYDRODIURIL) 25 MG tablet

## 2016-01-07 NOTE — Assessment & Plan Note (Signed)
Elevated bp last 3 visits in obese female with family hx  Given handout on DASH eating plan and HTN Start hctz 25 mg -disc poss side eff F/u 1 mo visit and labs Disc goals for bp   Last labs reviewed  Enc wt loss

## 2016-01-07 NOTE — Progress Notes (Signed)
Pre visit review using our clinic review tool, if applicable. No additional management support is needed unless otherwise documented below in the visit note. 

## 2016-01-10 ENCOUNTER — Other Ambulatory Visit: Payer: Self-pay | Admitting: Family Medicine

## 2016-01-23 DIAGNOSIS — Z961 Presence of intraocular lens: Secondary | ICD-10-CM | POA: Diagnosis not present

## 2016-02-06 ENCOUNTER — Ambulatory Visit (INDEPENDENT_AMBULATORY_CARE_PROVIDER_SITE_OTHER): Payer: Medicare Other | Admitting: Family Medicine

## 2016-02-06 ENCOUNTER — Encounter: Payer: Self-pay | Admitting: Family Medicine

## 2016-02-06 ENCOUNTER — Other Ambulatory Visit: Payer: Self-pay

## 2016-02-06 VITALS — BP 148/68 | HR 96 | Temp 98.5°F | Ht 67.0 in | Wt 205.5 lb

## 2016-02-06 DIAGNOSIS — I10 Essential (primary) hypertension: Secondary | ICD-10-CM | POA: Diagnosis not present

## 2016-02-06 DIAGNOSIS — R739 Hyperglycemia, unspecified: Secondary | ICD-10-CM

## 2016-02-06 DIAGNOSIS — E669 Obesity, unspecified: Secondary | ICD-10-CM

## 2016-02-06 DIAGNOSIS — Z1231 Encounter for screening mammogram for malignant neoplasm of breast: Secondary | ICD-10-CM

## 2016-02-06 MED ORDER — AMLODIPINE BESYLATE 5 MG PO TABS: 5.0000 mg | ORAL_TABLET | Freq: Every day | ORAL | Status: DC

## 2016-02-06 NOTE — Assessment & Plan Note (Signed)
Discussed how this problem influences overall health and the risks it imposes  Reviewed plan for weight loss with lower calorie diet (via better food choices and also portion control or program like weight watchers) and exercise building up to or more than 30 minutes 5 days per week including some aerobic activity   Wt is down a bit - some of this may be from diuresis (but coming off hctz now due to side eff) Enc to keep working on wt loss

## 2016-02-06 NOTE — Assessment & Plan Note (Signed)
Pt did not tolerate HCTZ - multiple side effects Will stop it and try amlodipine 5 mg  Disc poss side eff like pedal edema- inst to call and update if any intolerable side eff inst to keep working on DASH diet/exercise and wt loss  F/u in early June with lab prior

## 2016-02-06 NOTE — Progress Notes (Signed)
Pre visit review using our clinic review tool, if applicable. No additional management support is needed unless otherwise documented below in the visit note. 

## 2016-02-06 NOTE — Patient Instructions (Addendum)
Stay off the HCTZ  Start amlodipine 5 mg once daily  Keep watching sodium in your diet  Keep losing weight  Make sure to drink enough water   If any intolerable side effects from the amlodipine please stop it and call to let me know  Stay active   Follow up in early June with labs prior

## 2016-02-06 NOTE — Progress Notes (Signed)
Subjective:    Patient ID: [name], female    DOB: September 11, 1941, 75 y.o.   MRN: [id number]  HPI Here for f/u of HTN   Last visit started on hctz 25 mg daily Having some side effects  It makes her feel weak and gives her headaches and dizziness (even staggering) and stomach "hurt" Unsure if it made her bp too low -never had a chance to check it   Did not take it today  (last took at at 12 noon yesterday)   BP Readings from Last 3 Encounters:  02/06/16 148/68  01/07/16 140/80  01/03/16 142/98    Better today but not 100%  This is the first thing she has tried for HTN   No swelling in feet or ankles No ha today   Patient Active Problem List   Diagnosis Date Noted  . Essential hypertension 01/07/2016  . Gastritis 11/07/2015  . Fatty liver 11/07/2015  . Coronary atherosclerosis 11/07/2015  . Acute upper respiratory infection 04/02/2015  . Left shoulder pain 12/25/2014  . Left-sided chest wall pain 12/25/2014  . Fatigue 04/24/2014  . Obesity 04/24/2014  . Encounter for Medicare annual wellness exam 04/24/2014  . Post herpetic neuralgia 07/09/2011  . Low back pain 07/09/2011  . Left sided abdominal pain 06/12/2011  . Rheumatoid arthritis ([place]) 07/23/2007  . GERD 07/09/2007  . HYPERCHOLESTEROLEMIA 03/19/2007  . PSVT 03/19/2007  . ALLERGIC RHINITIS 03/19/2007  . ENDOMETRIAL POLYP 03/19/2007  . POSTMENOPAUSAL STATUS 03/19/2007  . OSTEOARTHRITIS 03/19/2007  . SPINAL STENOSIS 03/19/2007  . MURMUR 03/19/2007  . NEPHROLITHIASIS, HX OF 03/19/2007   Past Medical History  Diagnosis Date  . RA (rheumatoid arthritis) ([place])   . Allergic rhinitis   . Osteoarthritis   . Gastritis   . Hyperlipemia   . Former smoker   . Hemorrhoids   . Personal history of colonic polyps 1998    hyperplastic  . Esophageal reflux   . Fibula fracture 02/2005  . History of nephrolithiasis   . Shingles 2012  . Anxiety   . Heart murmur   . Diverticulosis   . Essential hypertension  01/07/2016   Past Surgical History  Procedure Laterality Date  . Cholecystectomy    . Vaginal polyp excised  12/2000    benign  . Lipoma excision  11/2002  . Svt s/p surgery--? ablation    . Ep procedure  1994    SVT; normal echo  . Esophagogastroduodenoscopy  11/03    reactive gastropathy  . Colonoscopy  11/08    internal hemorrhoids  . Upper gastrointestinal endoscopy     Social History  Substance Use Topics  . Smoking status: Former Smoker    Quit date: 10/21/2003  . Smokeless tobacco: Never Used  . Alcohol Use: No   Family History  Problem Relation Age of Onset  . Colon cancer Father 39  . Diabetes Mother   . Heart disease Mother   . Kidney disease Mother     ESRD  . Stroke Mother   . Hypertension Brother   . Breast cancer Sister   . Diabetes Other     nephews  . Colon cancer Sister   . Esophageal cancer Neg Hx   . Stomach cancer Neg Hx   . Rectal cancer Neg Hx    Allergies  Allergen Reactions  . Bee Venom Anaphylaxis  . Cetirizine Hcl     REACTION: does not work  . Hctz [Hydrochlorothiazide] Other (See Comments)    Felt weak  all over    Current Outpatient Prescriptions on File Prior to Visit  Medication Sig Dispense Refill  . ALPRAZolam (XANAX) 0.25 MG tablet Take 1 tablet (0.25 mg total) by mouth daily as needed for anxiety (take only if needed ). 30 tablet 0  . Calcium & Magnesium Carbonates (MYLANTA PO) Take 1 capsule by mouth as directed.      . dicyclomine (BENTYL) 10 MG capsule TAKE 1 CAPSULE (10 MG TOTAL) BY MOUTH 2 (TWO) TIMES DAILY AS NEEDED (SPASMS AND CRAMPING). (Patient taking differently: Take 10 mg by mouth 2 (two) times daily as needed for spasms. ) 180 capsule 1  . EPINEPHrine (EPIPEN IJ) Inject as directed as needed. Reported on 12/05/2015    . fluticasone (FLONASE) 50 MCG/ACT nasal spray PLACE 2 SPRAYS INTO THE NOSE DAILY. 16 g 5  . folic acid (FOLVITE) 1 MG tablet Take 1 mg by mouth daily.     Marland Kitchen loratadine (CLARITIN) 10 MG tablet Take 10 mg  by mouth daily.    . methotrexate (RHEUMATREX) 2.5 MG tablet Take 10 mg by mouth once a week. Caution:Chemotherapy. Protect from light.    Marland Kitchen NEXIUM 40 MG capsule TAKE 1 CAPSULE BY MOUTH TWICE DAILY 60 capsule 5  . ondansetron (ZOFRAN-ODT) 4 MG disintegrating tablet TAKE 1 TABLET BY MOUTH EVERY 8 HOURS AS NEEDED FOR NAUSEA/VOMIT 30 tablet 3  . ranitidine (ZANTAC) 150 MG capsule Take 1 capsule (150 mg total) by mouth 2 (two) times daily. 60 capsule 11   No current facility-administered medications on file prior to visit.    Review of Systems Review of Systems  Constitutional: Negative for fever, appetite change, and unexpected weight change. pos for fatigue and generalized weakness that is improved today after stopping hctz  Eyes: Negative for pain and visual disturbance.  Respiratory: Negative for cough and shortness of breath.   Cardiovascular: Negative for cp or palpitations    Gastrointestinal: Negative for nausea, diarrhea and constipation.  Genitourinary: Negative for urgency and frequency.  Skin: Negative for pallor or rash   MSK pos for joint pain from RA Neurological: Negative for weakness, numbness and headaches.  Hematological: Negative for adenopathy. Does not bruise/bleed easily.  Psychiatric/Behavioral: Negative for dysphoric mood. The patient is somewhat nervous/anxious.         Objective:   Physical Exam  Constitutional: She appears well-developed and well-nourished. No distress.  obese and well appearing   HENT:  Head: Normocephalic and atraumatic.  Mouth/Throat: Oropharynx is clear and moist.  Eyes: Conjunctivae and EOM are normal. Pupils are equal, round, and reactive to light.  Neck: Normal range of motion. Neck supple. No JVD present. Carotid bruit is not present. No thyromegaly present.  Cardiovascular: Normal rate, regular rhythm and intact distal pulses.  Exam reveals no gallop.   Murmur heard. Pulmonary/Chest: Effort normal and breath sounds normal. No  respiratory distress. She has no wheezes. She has no rales.  No crackles  Abdominal: Soft. Bowel sounds are normal. She exhibits no distension, no abdominal bruit and no mass. There is no tenderness.  Musculoskeletal: She exhibits no edema.  Lymphadenopathy:    She has no cervical adenopathy.  Neurological: She is alert. She has normal reflexes.  Skin: Skin is warm and dry. No rash noted.  Psychiatric: She has a normal mood and affect.          Assessment & Plan:   Problem List Items Addressed This Visit      Cardiovascular and Mediastinum   Essential hypertension -  Primary    Pt did not tolerate HCTZ - multiple side effects Will stop it and try amlodipine 5 mg  Disc poss side eff like pedal edema- inst to call and update if any intolerable side eff inst to keep working on DASH diet/exercise and wt loss  F/u in early June with lab prior        Relevant Medications   amLODipine (NORVASC) 5 MG tablet     Other   Obesity    Discussed how this problem influences overall health and the risks it imposes  Reviewed plan for weight loss with lower calorie diet (via better food choices and also portion control or program like weight watchers) and exercise building up to or more than 30 minutes 5 days per week including some aerobic activity   Wt is down a bit - some of this may be from diuresis (but coming off hctz now due to side eff) Enc to keep working on wt loss

## 2016-02-15 ENCOUNTER — Ambulatory Visit (INDEPENDENT_AMBULATORY_CARE_PROVIDER_SITE_OTHER): Payer: Medicare Other | Admitting: Family Medicine

## 2016-02-15 ENCOUNTER — Encounter: Payer: Self-pay | Admitting: Family Medicine

## 2016-02-15 ENCOUNTER — Ambulatory Visit (INDEPENDENT_AMBULATORY_CARE_PROVIDER_SITE_OTHER)
Admission: RE | Admit: 2016-02-15 | Discharge: 2016-02-15 | Disposition: A | Discharge status: Home / Self Care | Payer: Medicare Other | Source: Ambulatory Visit | Attending: Family Medicine | Admitting: Family Medicine

## 2016-02-15 VITALS — BP 130/70 | HR 73 | Temp 98.3°F | Ht 67.0 in | Wt 208.5 lb

## 2016-02-15 DIAGNOSIS — R1032 Left lower quadrant pain: Secondary | ICD-10-CM | POA: Diagnosis not present

## 2016-02-15 DIAGNOSIS — K2970 Gastritis, unspecified, without bleeding: Secondary | ICD-10-CM | POA: Diagnosis not present

## 2016-02-15 DIAGNOSIS — G8929 Other chronic pain: Secondary | ICD-10-CM | POA: Diagnosis not present

## 2016-02-15 DIAGNOSIS — R1013 Epigastric pain: Secondary | ICD-10-CM | POA: Diagnosis not present

## 2016-02-15 DIAGNOSIS — R8290 Unspecified abnormal findings in urine: Secondary | ICD-10-CM | POA: Diagnosis not present

## 2016-02-15 DIAGNOSIS — K6389 Other specified diseases of intestine: Secondary | ICD-10-CM | POA: Diagnosis not present

## 2016-02-15 LAB — COMPREHENSIVE METABOLIC PANEL
ALT: 12 U/L (ref 0–35)
AST: 18 U/L (ref 0–37)
Albumin: 4.4 g/dL (ref 3.5–5.2)
Alkaline Phosphatase: 68 U/L (ref 39–117)
BUN: 8 mg/dL (ref 6–23)
CHLORIDE: 102 meq/L (ref 96–112)
CO2: 29 meq/L (ref 19–32)
Calcium: 9.6 mg/dL (ref 8.4–10.5)
Creatinine, Ser: 0.79 mg/dL (ref 0.40–1.20)
GFR: 91.55 mL/min (ref >60.00)
GLUCOSE: 111 mg/dL — AB (ref 70–99)
POTASSIUM: 3.9 meq/L (ref 3.5–5.1)
SODIUM: 138 meq/L (ref 135–145)
Total Bilirubin: 0.3 mg/dL (ref 0.2–1.2)
Total Protein: 8.1 g/dL (ref 6.0–8.3)

## 2016-02-15 LAB — CBC WITH DIFFERENTIAL/PLATELET
BASOS PCT: 0.4 % (ref 0.0–3.0)
Basophils Absolute: 0 10*3/uL (ref 0.0–0.1)
EOS PCT: 2.5 % (ref 0.0–5.0)
Eosinophils Absolute: 0.2 10*3/uL (ref 0.0–0.7)
HCT: 43 % (ref 36.0–46.0)
Hemoglobin: 14.3 g/dL (ref 12.0–15.0)
LYMPHS ABS: 2.3 10*3/uL (ref 0.7–4.0)
Lymphocytes Relative: 26.8 % (ref 12.0–46.0)
MCHC: 33.3 g/dL (ref 30.0–36.0)
MCV: 85.4 fl (ref 78.0–100.0)
MONO ABS: 0.5 10*3/uL (ref 0.1–1.0)
Monocytes Relative: 5.9 % (ref 3.0–12.0)
NEUTROS PCT: 64.4 % (ref 43.0–77.0)
Neutro Abs: 5.5 10*3/uL (ref 1.4–7.7)
Platelets: 291 10*3/uL (ref 150.0–400.0)
RBC: 5.03 Mil/uL (ref 3.87–5.11)
RDW: 15.6 % — AB (ref 11.5–15.5)
WBC: 8.5 10*3/uL (ref 4.0–10.5)

## 2016-02-15 LAB — POC URINALSYSI DIPSTICK (AUTOMATED)
Bilirubin, UA: NEGATIVE
Glucose, UA: NEGATIVE
NITRITE UA: NEGATIVE
RBC UA: NEGATIVE
SPEC GRAV UA: 1.015
UROBILINOGEN UA: 0.2
pH, UA: 7.5

## 2016-02-15 LAB — LIPASE: Lipase: 24 U/L (ref 11.0–59.0)

## 2016-02-15 LAB — AMYLASE: Amylase: 47 U/L (ref 27–131)

## 2016-02-15 NOTE — Patient Instructions (Signed)
Xray of abdomen today Blood and urine tests today  Continue avoiding fatty foods and spicy food or anything that bothers you  Continue current medicines Plan when labs and xray return   If symptoms suddenly worsen-go to the ER

## 2016-02-15 NOTE — Progress Notes (Signed)
Pre visit review using our clinic review tool, if applicable. No additional management support is needed unless otherwise documented below in the visit note. 

## 2016-02-15 NOTE — Progress Notes (Signed)
Subjective:    Patient ID: [name], female    DOB: 24-Feb-1941, 75 y.o.   MRN: [id number]  HPI Here with stomach problems   It just hurts  Eating makes no difference  Over the weekend was worse Area changed position -used to be epigastric now it is lower   He saw Dr Fuller Plan in feb - on nexium bid and zantac qhs and dicyclomine and miralax for constipation    Middle of low abdomen  Hurts constantly all the time  Eases a bit when she lies still  Pain is achey  Has been ongoing since before her last visit   Not constipated  occ uses miralax to clean herself out - every other week  No blood in stool or dark stool  2 wk ago had a bout of diarrhea for 3 days   No abx lately  No hosp or nursing home visits   Last colonoscopy was 3/14 - ok / 5 year recall   UA and cx sent today  Patient Active Problem List   Diagnosis Date Noted  . Abdominal pain, left lower quadrant 02/15/2016  . Abdominal pain, chronic, epigastric 02/15/2016  . Hyperglycemia 02/06/2016  . Essential hypertension 01/07/2016  . Gastritis 11/07/2015  . Fatty liver 11/07/2015  . Coronary atherosclerosis 11/07/2015  . Acute upper respiratory infection 04/02/2015  . Left shoulder pain 12/25/2014  . Left-sided chest wall pain 12/25/2014  . Fatigue 04/24/2014  . Obesity 04/24/2014  . Encounter for Medicare annual wellness exam 04/24/2014  . Post herpetic neuralgia 07/09/2011  . Low back pain 07/09/2011  . Left sided abdominal pain 06/12/2011  . Rheumatoid arthritis ([place]) 07/23/2007  . GERD 07/09/2007  . HYPERCHOLESTEROLEMIA 03/19/2007  . PSVT 03/19/2007  . ALLERGIC RHINITIS 03/19/2007  . ENDOMETRIAL POLYP 03/19/2007  . POSTMENOPAUSAL STATUS 03/19/2007  . OSTEOARTHRITIS 03/19/2007  . SPINAL STENOSIS 03/19/2007  . MURMUR 03/19/2007  . NEPHROLITHIASIS, HX OF 03/19/2007   Past Medical History  Diagnosis Date  . RA (rheumatoid arthritis) ([place])   . Allergic rhinitis   . Osteoarthritis   .  Gastritis   . Hyperlipemia   . Former smoker   . Hemorrhoids   . Personal history of colonic polyps 1998    hyperplastic  . Esophageal reflux   . Fibula fracture 02/2005  . History of nephrolithiasis   . Shingles 2012  . Anxiety   . Heart murmur   . Diverticulosis   . Essential hypertension 01/07/2016   Past Surgical History  Procedure Laterality Date  . Cholecystectomy    . Vaginal polyp excised  12/2000    benign  . Lipoma excision  11/2002  . Svt s/p surgery--? ablation    . Ep procedure  1994    SVT; normal echo  . Esophagogastroduodenoscopy  11/03    reactive gastropathy  . Colonoscopy  11/08    internal hemorrhoids  . Upper gastrointestinal endoscopy     Social History  Substance Use Topics  . Smoking status: Former Smoker    Quit date: 10/21/2003  . Smokeless tobacco: Never Used  . Alcohol Use: No   Family History  Problem Relation Age of Onset  . Colon cancer Father 19  . Diabetes Mother   . Heart disease Mother   . Kidney disease Mother     ESRD  . Stroke Mother   . Hypertension Brother   . Breast cancer Sister   . Diabetes Other     nephews  . Colon cancer  Sister   . Esophageal cancer Neg Hx   . Stomach cancer Neg Hx   . Rectal cancer Neg Hx    Allergies  Allergen Reactions  . Bee Venom Anaphylaxis  . Cetirizine Hcl     REACTION: does not work  . Hctz [Hydrochlorothiazide] Other (See Comments)    Felt weak all over    Current Outpatient Prescriptions on File Prior to Visit  Medication Sig Dispense Refill  . ALPRAZolam (XANAX) 0.25 MG tablet Take 1 tablet (0.25 mg total) by mouth daily as needed for anxiety (take only if needed ). 30 tablet 0  . amLODipine (NORVASC) 5 MG tablet Take 1 tablet (5 mg total) by mouth daily. 30 tablet 11  . Calcium & Magnesium Carbonates (MYLANTA PO) Take 1 capsule by mouth as directed.      . dicyclomine (BENTYL) 10 MG capsule TAKE 1 CAPSULE (10 MG TOTAL) BY MOUTH 2 (TWO) TIMES DAILY AS NEEDED (SPASMS AND CRAMPING).  (Patient taking differently: Take 10 mg by mouth 2 (two) times daily as needed for spasms. ) 180 capsule 1  . EPINEPHrine (EPIPEN IJ) Inject as directed as needed. Reported on 12/05/2015    . fluticasone (FLONASE) 50 MCG/ACT nasal spray PLACE 2 SPRAYS INTO THE NOSE DAILY. 16 g 5  . folic acid (FOLVITE) 1 MG tablet Take 1 mg by mouth daily.     Marland Kitchen loratadine (CLARITIN) 10 MG tablet Take 10 mg by mouth daily.    . methotrexate (RHEUMATREX) 2.5 MG tablet Take 10 mg by mouth once a week. Caution:Chemotherapy. Protect from light.    Marland Kitchen NEXIUM 40 MG capsule TAKE 1 CAPSULE BY MOUTH TWICE DAILY 60 capsule 5  . ondansetron (ZOFRAN-ODT) 4 MG disintegrating tablet TAKE 1 TABLET BY MOUTH EVERY 8 HOURS AS NEEDED FOR NAUSEA/VOMIT 30 tablet 3  . ranitidine (ZANTAC) 150 MG capsule Take 1 capsule (150 mg total) by mouth 2 (two) times daily. 60 capsule 11   No current facility-administered medications on file prior to visit.     Review of Systems   Review of Systems  Constitutional: Negative for fever, appetite change, fatigue and unexpected weight change.  Eyes: Negative for pain and visual disturbance.  Respiratory: Negative for cough and shortness of breath.   Cardiovascular: Negative for cp or palpitations    Gastrointestinal: Negative for nausea, diarrhea and blood in stool   Genitourinary: Negative for urgency and frequency. neg for hematuria  Skin: Negative for pallor or rash   Neurological: Negative for weakness, light-headedness, numbness and headaches.  Hematological: Negative for adenopathy. Does not bruise/bleed easily.  Psychiatric/Behavioral: Negative for dysphoric mood. The patient is not nervous/anxious.      Objective:   Physical Exam  Constitutional: She appears well-developed and well-nourished. No distress.  obese and well appearing   HENT:  Head: Normocephalic and atraumatic.  Mouth/Throat: Oropharynx is clear and moist.  Eyes: Conjunctivae and EOM are normal. Pupils are equal,  round, and reactive to light. No scleral icterus.  Neck: Normal range of motion. Neck supple.  Cardiovascular: Normal rate, regular rhythm and normal heart sounds.   Pulmonary/Chest: Effort normal and breath sounds normal. No respiratory distress. She has no wheezes. She has no rales.  Abdominal: Soft. Bowel sounds are normal. She exhibits no distension, no abdominal bruit, no pulsatile midline mass and no mass. There is no hepatosplenomegaly. There is tenderness in the epigastric area, suprapubic area, left upper quadrant and left lower quadrant. There is no rigidity, no rebound, no guarding, no  CVA tenderness, no tenderness at McBurney's point and negative Murphy's sign.  Lymphadenopathy:    She has no cervical adenopathy.  Neurological: She is alert.  Skin: Skin is warm and dry. No erythema. No pallor.  Psychiatric: She has a normal mood and affect.          Assessment & Plan:   Problem List Items Addressed This Visit      Digestive   Gastritis    Ongoing upper abd pain and bloating and burning sens Continue ppi Lab today Abd xr today Rev prev studies and CT scan  Also today c/o some lower abd discomfort  May req ref back to GI        Other   Abdominal pain, chronic, epigastric    On PPI Lab today Abd film today Rev old GI studies and CT May req return ot GI       Relevant Orders   CBC with Differential/Platelet (Completed)   Comprehensive metabolic panel (Completed)   Amylase (Completed)   Lipase (Completed)   DG Abd 1 View (Completed)   Abdominal pain, left lower quadrant - Primary    This is new to [place] Rev last colonoscopy Lab today - if wbc elevated consider tx for diverticulitis and CT abd xr today-has hx of chronic constipation as well  May req return to GI      Relevant Orders   DG Abd 1 View (Completed)   POCT Urinalysis Dipstick (Automated) (Completed)    Other Visit Diagnoses    Abnormal urinalysis        Relevant Orders    Urine culture  (Completed)

## 2016-02-17 ENCOUNTER — Telehealth: Payer: Self-pay | Admitting: Family Medicine

## 2016-02-17 DIAGNOSIS — R1013 Epigastric pain: Secondary | ICD-10-CM

## 2016-02-17 DIAGNOSIS — G8929 Other chronic pain: Secondary | ICD-10-CM

## 2016-02-17 DIAGNOSIS — R1032 Left lower quadrant pain: Secondary | ICD-10-CM

## 2016-02-17 LAB — URINE CULTURE
COLONY COUNT: NO GROWTH
Organism ID, Bacteria: NO GROWTH

## 2016-02-17 NOTE — Assessment & Plan Note (Signed)
On PPI Lab today Abd film today Rev old GI studies and CT May req return ot GI

## 2016-02-17 NOTE — Assessment & Plan Note (Signed)
Ongoing upper abd pain and bloating and burning sens Continue ppi Lab today Abd xr today Rev prev studies and CT scan  Also today c/o some lower abd discomfort  May req ref back to GI

## 2016-02-17 NOTE — Assessment & Plan Note (Signed)
This is new to [place] Rev last colonoscopy Lab today - if wbc elevated consider tx for diverticulitis and CT abd xr today-has hx of chronic constipation as well  May req return to GI

## 2016-02-18 NOTE — Telephone Encounter (Signed)
GI Appt made with Nicoletta Ba and patient is aware.

## 2016-02-21 ENCOUNTER — Ambulatory Visit: Payer: Medicare Other

## 2016-02-26 ENCOUNTER — Ambulatory Visit (INDEPENDENT_AMBULATORY_CARE_PROVIDER_SITE_OTHER): Payer: Medicare Other | Admitting: Physician Assistant

## 2016-02-26 ENCOUNTER — Encounter: Payer: Self-pay | Admitting: Physician Assistant

## 2016-02-26 VITALS — BP 132/78 | HR 82 | Ht 68.0 in | Wt 206.0 lb

## 2016-02-26 DIAGNOSIS — R634 Abnormal weight loss: Secondary | ICD-10-CM | POA: Diagnosis not present

## 2016-02-26 DIAGNOSIS — R1084 Generalized abdominal pain: Secondary | ICD-10-CM

## 2016-02-26 DIAGNOSIS — K5909 Other constipation: Secondary | ICD-10-CM | POA: Diagnosis not present

## 2016-02-26 DIAGNOSIS — R195 Other fecal abnormalities: Secondary | ICD-10-CM | POA: Diagnosis not present

## 2016-02-26 MED ORDER — NA SULFATE-K SULFATE-MG SULF 17.5-3.13-1.6 GM/177ML PO SOLN
1.0000 | Freq: Once | ORAL | Status: AC
Start: 2016-02-26 — End: 2016-03-27

## 2016-02-26 MED ORDER — ALPRAZOLAM 0.25 MG PO TABS: ORAL_TABLET | ORAL | Status: DC

## 2016-02-26 NOTE — Progress Notes (Signed)
Patient ID: [name], female   DOB: 05/08/1941, 75 y.o.   MRN: [id number]   Subjective:    Patient ID: [name], female    DOB: 1941/06/01, 75 y.o.   MRN: [id number]  HPI  [name] is a pleasant 75 year old African-American female known to Dr. Fuller Plan. She is referred back today by Dr. Glori Bickers. She was last seen in our office in February 2017 at that time with complaints of lower chest and epigastric pain. It was felt that this was consistent with a musculoskeletal pain or costochondritis. Patient does have history of rheumatoid arthritis and is maintained on Rheumatrex. On Nexium 40 mg daily and Zantac at bedtime for control of GERD. Last colonoscopy was done in March 2014 with finding of sigmoid diverticulosis and internal hemorrhoids no polyps last EGD remotely in 2003. She comes in today stating that she has been having mid abdominal pain over the past 3-4 months and was having this pain when she was last seen in the office. Says his pain is bad at times and may hurt for hours. Her appetite has been okay but her weight has gradually come down 10 pounds. She says sometimes she feels worse after eating, and has been eating very bland. She has recently started on MiraLAX 17 g daily after KUB done by PCP showed constipation. Says her bowel movements are more frequent with the MiraLAX but still only going every other day. She's not noticed any melena or hematochezia.  No regular aspirin or NSAIDs. She's been taking Bentyl for several months but has noticed no change in symptoms. CT of the abdomen and pelvis was done in January 2017 during an ER visit and this was unremarkable. He is status post cholecystectomy.  Review of Systems Pertinent positive and negative review of systems were noted in the above HPI section.  All other review of systems was otherwise negative.  Outpatient Encounter Prescriptions as of 02/26/2016  Medication Sig  . amLODipine (NORVASC) 5 MG tablet Take 1 tablet (5 mg  total) by mouth daily.  . Calcium & Magnesium Carbonates (MYLANTA PO) Take 1 capsule by mouth as directed.    . dicyclomine (BENTYL) 10 MG capsule TAKE 1 CAPSULE (10 MG TOTAL) BY MOUTH 2 (TWO) TIMES DAILY AS NEEDED (SPASMS AND CRAMPING). (Patient taking differently: Take 10 mg by mouth 2 (two) times daily as needed for spasms. )  . EPINEPHrine (EPIPEN IJ) Inject as directed as needed. Reported on 12/05/2015  . fluticasone (FLONASE) 50 MCG/ACT nasal spray PLACE 2 SPRAYS INTO THE NOSE DAILY.  Marland Kitchen loratadine (CLARITIN) 10 MG tablet Take 10 mg by mouth daily.  . methotrexate (RHEUMATREX) 2.5 MG tablet Take 10 mg by mouth once a week. Caution:Chemotherapy. Protect from light.  Marland Kitchen NEXIUM 40 MG capsule TAKE 1 CAPSULE BY MOUTH TWICE DAILY  . ondansetron (ZOFRAN-ODT) 4 MG disintegrating tablet TAKE 1 TABLET BY MOUTH EVERY 8 HOURS AS NEEDED FOR NAUSEA/VOMIT  . polyethylene glycol (MIRALAX / GLYCOLAX) packet Take 17 g by mouth daily.  . ranitidine (ZANTAC) 150 MG capsule Take 1 capsule (150 mg total) by mouth 2 (two) times daily.  Marland Kitchen ALPRAZolam (XANAX) 0.25 MG tablet Take 1/2 to 1 tablet prior to the procedure.  . Na Sulfate-K Sulfate-Mg Sulf SOLN Take 1 kit by mouth once.  . [DISCONTINUED] ALPRAZolam (XANAX) 0.25 MG tablet Take 1 tablet (0.25 mg total) by mouth daily as needed for anxiety (take only if needed ).   No facility-administered encounter medications on file as of  02/26/2016.   Allergies  Allergen Reactions  . Bee Venom Anaphylaxis  . Cetirizine Hcl     REACTION: does not work  . Hctz [Hydrochlorothiazide] Other (See Comments)    Felt weak all over    Patient Active Problem List   Diagnosis Date Noted  . Abdominal pain, left lower quadrant 02/15/2016  . Abdominal pain, chronic, epigastric 02/15/2016  . Hyperglycemia 02/06/2016  . Essential hypertension 01/07/2016  . Gastritis 11/07/2015  . Fatty liver 11/07/2015  . Coronary atherosclerosis 11/07/2015  . Acute upper respiratory infection  04/02/2015  . Left shoulder pain 12/25/2014  . Left-sided chest wall pain 12/25/2014  . Fatigue 04/24/2014  . Obesity 04/24/2014  . Encounter for Medicare annual wellness exam 04/24/2014  . Post herpetic neuralgia 07/09/2011  . Low back pain 07/09/2011  . Left sided abdominal pain 06/12/2011  . Rheumatoid arthritis ([place]) 07/23/2007  . GERD 07/09/2007  . HYPERCHOLESTEROLEMIA 03/19/2007  . PSVT 03/19/2007  . ALLERGIC RHINITIS 03/19/2007  . ENDOMETRIAL POLYP 03/19/2007  . POSTMENOPAUSAL STATUS 03/19/2007  . OSTEOARTHRITIS 03/19/2007  . SPINAL STENOSIS 03/19/2007  . MURMUR 03/19/2007  . NEPHROLITHIASIS, HX OF 03/19/2007   Social History   Social History  . Marital Status: Married    Spouse Name: N/A  . Number of Children: 3  . Years of Education: N/A   Occupational History  . retired   .     Social History Main Topics  . Smoking status: Former Smoker    Quit date: 10/21/2003  . Smokeless tobacco: Never Used  . Alcohol Use: No  . Drug Use: No  . Sexual Activity: Yes   Other Topics Concern  . Not on file   Social History Narrative   Married      Retired      Occasional caffeine      No regular exercise    Ms. Hoar's family history includes Breast cancer in her sister; Colon cancer in her sister; Colon cancer (age of onset: 6) in her father; Diabetes in her mother and other; Heart disease in her mother; Hypertension in her brother; Kidney disease in her mother; Stroke in her mother. There is no history of Esophageal cancer, Stomach cancer, or Rectal cancer.      Objective:    Filed Vitals:   02/26/16 1023  BP: 132/78  Pulse: 82    Physical Exam  well-developed older African-American female in no acute distress, comfortable by her husband blood pressure 132/78 pulse 82 height 5 foot 8 weight 206. HEENT; nontraumatic normocephalic EOMI PERRLA sclera anicteric, Cardiovascular; regular rate and rhythm with S1-S2 no murmur or gallop, Pulmonary; clear  bilaterally, Abdomen; soft she has some tenderness in the left upper quadrant left mid quadrant left lower quadrant is no guarding or rebound no palpable mass or hepatosplenomegaly bowel sounds are present, Rectal; exam large external hemorrhoidal tags stool is brown and Hemoccult positive no clubbing cyanosis or edema skin warm dry, Neuropsych ;mood and affect appropriate       Assessment & Plan:   #1 75 yo AA female with Several month history of mid and left-sided abdominal pain, intermittent sometimes worse postprandially. Patient has had an associated 10 pound weight loss and has heme positive stool. Etiology of symptoms is not clear. Rule out peptic ulcer disease, occult colon lesion or other intra-abdominal inflammatory process #2 chronic GERD #3 hepatic steatosis #4 rheumatoid arthritis   #5 hypertension #6 history of PSVT  Plan; stopped Bentyl as she's not had any improvement in  symptoms Continue Nexium and Zantac at current doses Will purge her bowel with a MiraLAX purge and then asked her to continue MiraLAX 17 g every day in 8 ounces of water Schedule for EGD and colonoscopy with Dr. Fuller Plan. Procedures discussed in detail with patient and she is agreeable to proceed.  Lakiyah Arntson S Biagio Snelson PA-C 02/26/2016   Cc: Tower, Wynelle Fanny, MD

## 2016-02-26 NOTE — Patient Instructions (Signed)
Stop the Bentyl. Take Miralax daily.  Do a bowel purge with 5-6 doses of Miralax ,mixed the clear juice or Gatorade, (17 grams in a glass of liquid. ) 1 glass every 15 minutes for 5-6 doses.   You have been scheduled for an endoscopy and colonoscopy. Please follow the written instructions given to you at your visit today. Please pick up your prep supplies at the pharmacy within the next 1-3 days. If you use inhalers (even only as needed), please bring them with you on the day of your procedure. Your physician has requested that you go to www.startemmi.com and enter the access code given to you at your visit today. This web site gives a general overview about your procedure. However, you should still follow specific instructions given to you by our office regarding your preparation for the procedure.

## 2016-02-26 NOTE — Progress Notes (Signed)
Reviewed and agree with management plan.  Jireh Vinas T. Davison Ohms, MD FACG 

## 2016-02-27 ENCOUNTER — Ambulatory Visit
Admission: RE | Admit: 2016-02-27 | Discharge: 2016-02-27 | Disposition: A | Discharge status: Home / Self Care | Payer: Medicare Other | Source: Ambulatory Visit

## 2016-02-27 DIAGNOSIS — Z1231 Encounter for screening mammogram for malignant neoplasm of breast: Secondary | ICD-10-CM

## 2016-02-27 LAB — HM MAMMOGRAPHY

## 2016-02-28 ENCOUNTER — Encounter: Payer: Self-pay | Admitting: *Deleted

## 2016-03-24 ENCOUNTER — Other Ambulatory Visit (INDEPENDENT_AMBULATORY_CARE_PROVIDER_SITE_OTHER): Payer: Medicare Other

## 2016-03-24 DIAGNOSIS — R739 Hyperglycemia, unspecified: Secondary | ICD-10-CM | POA: Diagnosis not present

## 2016-03-24 DIAGNOSIS — I10 Essential (primary) hypertension: Secondary | ICD-10-CM

## 2016-03-24 LAB — COMPREHENSIVE METABOLIC PANEL
ALT: 11 U/L (ref 0–35)
AST: 18 U/L (ref 0–37)
Albumin: 4.2 g/dL (ref 3.5–5.2)
Alkaline Phosphatase: 74 U/L (ref 39–117)
BILIRUBIN TOTAL: 0.4 mg/dL (ref 0.2–1.2)
BUN: 12 mg/dL (ref 6–23)
CALCIUM: 9.2 mg/dL (ref 8.4–10.5)
CHLORIDE: 104 meq/L (ref 96–112)
CO2: 29 meq/L (ref 19–32)
Creatinine, Ser: 0.86 mg/dL (ref 0.40–1.20)
GFR: 82.98 mL/min (ref >60.00)
Glucose, Bld: 112 mg/dL — ABNORMAL HIGH (ref 70–99)
Potassium: 3.9 mEq/L (ref 3.5–5.1)
Sodium: 138 mEq/L (ref 135–145)
Total Protein: 7.6 g/dL (ref 6.0–8.3)

## 2016-03-24 LAB — HEMOGLOBIN A1C: Hgb A1c MFr Bld: 6.4 % (ref 4.6–6.5)

## 2016-03-26 ENCOUNTER — Encounter: Payer: Self-pay | Admitting: Family Medicine

## 2016-03-26 ENCOUNTER — Encounter: Payer: Self-pay | Admitting: Gastroenterology

## 2016-03-26 ENCOUNTER — Ambulatory Visit (INDEPENDENT_AMBULATORY_CARE_PROVIDER_SITE_OTHER): Payer: Medicare Other | Admitting: Family Medicine

## 2016-03-26 VITALS — BP 130/70 | HR 79 | Temp 98.3°F | Ht 67.0 in | Wt 208.5 lb

## 2016-03-26 DIAGNOSIS — I10 Essential (primary) hypertension: Secondary | ICD-10-CM

## 2016-03-26 DIAGNOSIS — E669 Obesity, unspecified: Secondary | ICD-10-CM

## 2016-03-26 DIAGNOSIS — R739 Hyperglycemia, unspecified: Secondary | ICD-10-CM

## 2016-03-26 NOTE — Progress Notes (Signed)
Pre visit review using our clinic review tool, if applicable. No additional management support is needed unless otherwise documented below in the visit note. 

## 2016-03-26 NOTE — Patient Instructions (Addendum)
Blood pressure is improved Stay on your current medicines  Eat a healthy diet - low carb (sugar) and low fat  Work on weight loss to prevent diabetes Work up to 30 minutes of exercise daily (5 days per week)   Your blood sugar is borderline for diabetes   Follow up in 6 months with labs prior

## 2016-03-26 NOTE — Progress Notes (Signed)
Subjective:    Patient ID: [name], female    DOB: 11-08-1940, 75 y.o.   MRN: [id number]  HPI  Here for f/u of multiple medical problems   Wt is up 2 lb with bmi of 32  Obese range   For HTN last visit hctz was not tolerated and amlodipine was started at 5 mg  No problems or side effects -thinks it is in good control  bp is stable today (controlled) No cp or palpitations or headaches or edema  No side effects to medicines  BP Readings from Last 3 Encounters:  03/26/16 130/74  02/26/16 132/78  02/15/16 130/70   BP: 130/70 mmHg today on 2nd check      Chemistry      Component Value Date/Time   NA 138 03/24/2016 1033   K 3.9 03/24/2016 1033   CL 104 03/24/2016 1033   CO2 29 03/24/2016 1033   BUN 12 03/24/2016 1033   CREATININE 0.86 03/24/2016 1033      Component Value Date/Time   CALCIUM 9.2 03/24/2016 1033   ALKPHOS 74 03/24/2016 1033   AST 18 03/24/2016 1033   ALT 11 03/24/2016 1033   BILITOT 0.4 03/24/2016 1033         Lab Results  Component Value Date   HGBA1C 6.4 03/24/2016   This is down from 6.6 She has changed how she eats since last visit  Wt is not down however  She is avoiding fried foods  Also cut back on sugar  She still eats a little chocolate-but less/ no more sweets at home  Right at borderline of DM Knows she wants to loose wt Walks outside when not raining - about 12 minutes    Has colonoscopy and egd upcoming  miralax is helping symptoms quite a bit  Feels better oveall  Patient Active Problem List   Diagnosis Date Noted  . Abdominal pain, left lower quadrant 02/15/2016  . Abdominal pain, chronic, epigastric 02/15/2016  . Hyperglycemia 02/06/2016  . Essential hypertension 01/07/2016  . Gastritis 11/07/2015  . Fatty liver 11/07/2015  . Coronary atherosclerosis 11/07/2015  . Acute upper respiratory infection 04/02/2015  . Left shoulder pain 12/25/2014  . Left-sided chest wall pain 12/25/2014  . Fatigue 04/24/2014  .  Obesity 04/24/2014  . Encounter for Medicare annual wellness exam 04/24/2014  . Post herpetic neuralgia 07/09/2011  . Low back pain 07/09/2011  . Left sided abdominal pain 06/12/2011  . Rheumatoid arthritis ([place]) 07/23/2007  . GERD 07/09/2007  . HYPERCHOLESTEROLEMIA 03/19/2007  . PSVT 03/19/2007  . ALLERGIC RHINITIS 03/19/2007  . ENDOMETRIAL POLYP 03/19/2007  . POSTMENOPAUSAL STATUS 03/19/2007  . OSTEOARTHRITIS 03/19/2007  . SPINAL STENOSIS 03/19/2007  . MURMUR 03/19/2007  . NEPHROLITHIASIS, HX OF 03/19/2007   Past Medical History  Diagnosis Date  . RA (rheumatoid arthritis) ([place])   . Allergic rhinitis   . Osteoarthritis   . Gastritis   . Hyperlipemia   . Former smoker   . Hemorrhoids   . Personal history of colonic polyps 1998    hyperplastic  . Esophageal reflux   . Fibula fracture 02/2005  . History of nephrolithiasis   . Shingles 2012  . Anxiety   . Heart murmur   . Diverticulosis   . Essential hypertension 01/07/2016   Past Surgical History  Procedure Laterality Date  . Cholecystectomy    . Vaginal polyp excised  12/2000    benign  . Lipoma excision  11/2002  . Svt s/p surgery--? ablation    .  Ep procedure  1994    SVT; normal echo  . Esophagogastroduodenoscopy  11/03    reactive gastropathy  . Colonoscopy  11/08    internal hemorrhoids  . Upper gastrointestinal endoscopy     Social History  Substance Use Topics  . Smoking status: Former Smoker    Quit date: 10/21/2003  . Smokeless tobacco: Never Used  . Alcohol Use: No   Family History  Problem Relation Age of Onset  . Colon cancer Father 63  . Diabetes Mother   . Heart disease Mother   . Kidney disease Mother     ESRD  . Stroke Mother   . Hypertension Brother   . Breast cancer Sister   . Diabetes Other     nephews  . Colon cancer Sister   . Esophageal cancer Neg Hx   . Stomach cancer Neg Hx   . Rectal cancer Neg Hx    Allergies  Allergen Reactions  . Bee Venom Anaphylaxis  . Cetirizine  Hcl     REACTION: does not work  . Hctz [Hydrochlorothiazide] Other (See Comments)    Felt weak all over    Current Outpatient Prescriptions on File Prior to Visit  Medication Sig Dispense Refill  . ALPRAZolam (XANAX) 0.25 MG tablet Take 1/2 to 1 tablet prior to the procedure. 1 tablet 0  . amLODipine (NORVASC) 5 MG tablet Take 1 tablet (5 mg total) by mouth daily. 30 tablet 11  . Calcium & Magnesium Carbonates (MYLANTA PO) Take 1 capsule by mouth as directed.      . dicyclomine (BENTYL) 10 MG capsule TAKE 1 CAPSULE (10 MG TOTAL) BY MOUTH 2 (TWO) TIMES DAILY AS NEEDED (SPASMS AND CRAMPING). (Patient taking differently: Take 10 mg by mouth 2 (two) times daily as needed for spasms. ) 180 capsule 1  . EPINEPHrine (EPIPEN IJ) Inject as directed as needed. Reported on 12/05/2015    . fluticasone (FLONASE) 50 MCG/ACT nasal spray PLACE 2 SPRAYS INTO THE NOSE DAILY. 16 g 5  . loratadine (CLARITIN) 10 MG tablet Take 10 mg by mouth daily.    . methotrexate (RHEUMATREX) 2.5 MG tablet Take 10 mg by mouth once a week. Caution:Chemotherapy. Protect from light.    . Na Sulfate-K Sulfate-Mg Sulf SOLN Take 1 kit by mouth once. 354 mL 0  . NEXIUM 40 MG capsule TAKE 1 CAPSULE BY MOUTH TWICE DAILY 60 capsule 5  . ondansetron (ZOFRAN-ODT) 4 MG disintegrating tablet TAKE 1 TABLET BY MOUTH EVERY 8 HOURS AS NEEDED FOR NAUSEA/VOMIT 30 tablet 3  . polyethylene glycol (MIRALAX / GLYCOLAX) packet Take 17 g by mouth daily.    . ranitidine (ZANTAC) 150 MG capsule Take 1 capsule (150 mg total) by mouth 2 (two) times daily. 60 capsule 11   No current facility-administered medications on file prior to visit.    Review of Systems    Review of Systems  Constitutional: Negative for fever, appetite change, fatigue and unexpected weight change.  Eyes: Negative for pain and visual disturbance.  Respiratory: Negative for cough and shortness of breath.   Cardiovascular: Negative for cp or palpitations    Gastrointestinal:  Negative for nausea, diarrhea and pos for constipation and bloating that are improved  Genitourinary: Negative for urgency and frequency.  Skin: Negative for pallor or rash   Neurological: Negative for weakness, light-headedness, numbness and headaches.  Hematological: Negative for adenopathy. Does not bruise/bleed easily.  Psychiatric/Behavioral: Negative for dysphoric mood. The patient is not nervous/anxious.  Objective:   Physical Exam  Constitutional: She appears well-developed and well-nourished. No distress.  obese and well appearing   HENT:  Head: Normocephalic and atraumatic.  Mouth/Throat: Oropharynx is clear and moist.  Eyes: Conjunctivae and EOM are normal. Pupils are equal, round, and reactive to light.  Neck: Normal range of motion. Neck supple. No JVD present. Carotid bruit is not present. No thyromegaly present.  Cardiovascular: Normal rate, regular rhythm, normal heart sounds and intact distal pulses.  Exam reveals no gallop.   Pulmonary/Chest: Effort normal and breath sounds normal. No respiratory distress. She has no wheezes. She has no rales.  No crackles  Abdominal: Soft. Bowel sounds are normal. She exhibits no distension, no abdominal bruit and no mass. There is no tenderness.  Musculoskeletal: She exhibits no edema or tenderness.  Lymphadenopathy:    She has no cervical adenopathy.  Neurological: She is alert. She has normal reflexes. She exhibits normal muscle tone.  Skin: Skin is warm and dry. No rash noted. No pallor.  Psychiatric: She has a normal mood and affect.          Assessment & Plan:   Problem List Items Addressed This Visit      Cardiovascular and Mediastinum   Essential hypertension - Primary    Improved with amlodipine 5 mg and well tolerate bp in fair control at this time  BP Readings from Last 1 Encounters:  03/26/16 130/70   No changes needed Disc lifstyle change with low sodium diet and exercise  Wt loss enc      Relevant  Orders   Comprehensive metabolic panel   Lipid panel     Other   Obesity    Discussed how this problem influences overall health and the risks it imposes  Reviewed plan for weight loss with lower calorie diet (via better food choices and also portion control or program like weight watchers) and exercise building up to or more than 30 minutes 5 days per week including some aerobic activity   Disc imp of low glycemic diet for prev of DM as well Enc to gradually inc exercise (indoors or out) to 30 min or more 5 days per week      Hyperglycemia    Improved slt Lab Results  Component Value Date   HGBA1C 6.4 03/24/2016   Disc imp of low glycemic diet (handout on carb counting given) and wt loss to prevent DM      Relevant Orders   Hemoglobin A1c

## 2016-03-26 NOTE — Assessment & Plan Note (Signed)
Improved with amlodipine 5 mg and well tolerate bp in fair control at this time  BP Readings from Last 1 Encounters:  03/26/16 130/70   No changes needed Disc lifstyle change with low sodium diet and exercise  Wt loss enc

## 2016-03-26 NOTE — Assessment & Plan Note (Signed)
Improved slt Lab Results  Component Value Date   HGBA1C 6.4 03/24/2016   Disc imp of low glycemic diet (handout on carb counting given) and wt loss to prevent DM

## 2016-03-26 NOTE — Assessment & Plan Note (Signed)
Discussed how this problem influences overall health and the risks it imposes  Reviewed plan for weight loss with lower calorie diet (via better food choices and also portion control or program like weight watchers) and exercise building up to or more than 30 minutes 5 days per week including some aerobic activity   Disc imp of low glycemic diet for prev of DM as well Enc to gradually inc exercise (indoors or out) to 30 min or more 5 days per week

## 2016-04-08 DIAGNOSIS — M0579 Rheumatoid arthritis with rheumatoid factor of multiple sites without organ or systems involvement: Secondary | ICD-10-CM | POA: Diagnosis not present

## 2016-04-08 DIAGNOSIS — M545 Low back pain: Secondary | ICD-10-CM | POA: Diagnosis not present

## 2016-04-09 ENCOUNTER — Ambulatory Visit (AMBULATORY_SURGERY_CENTER): Payer: Medicare Other | Admitting: Gastroenterology

## 2016-04-09 ENCOUNTER — Encounter: Payer: Self-pay | Admitting: Gastroenterology

## 2016-04-09 VITALS — BP 120/79 | HR 78 | Temp 98.6°F | Resp 16 | Ht 68.0 in | Wt 206.0 lb

## 2016-04-09 DIAGNOSIS — R634 Abnormal weight loss: Secondary | ICD-10-CM | POA: Diagnosis not present

## 2016-04-09 DIAGNOSIS — R195 Other fecal abnormalities: Secondary | ICD-10-CM

## 2016-04-09 DIAGNOSIS — Z800 Family history of malignant neoplasm of digestive organs: Secondary | ICD-10-CM | POA: Diagnosis not present

## 2016-04-09 DIAGNOSIS — D125 Benign neoplasm of sigmoid colon: Secondary | ICD-10-CM

## 2016-04-09 DIAGNOSIS — K635 Polyp of colon: Secondary | ICD-10-CM

## 2016-04-09 DIAGNOSIS — R1084 Generalized abdominal pain: Secondary | ICD-10-CM | POA: Diagnosis not present

## 2016-04-09 MED ORDER — SODIUM CHLORIDE 0.9 % IV SOLN: 500.0000 mL | INTRAVENOUS | Status: DC

## 2016-04-09 NOTE — Progress Notes (Signed)
A/ox3 pleased with MAC, report to Wendy RN 

## 2016-04-09 NOTE — Op Note (Addendum)
[place] Patient Name: [name] Procedure Date: 04/09/2016 3:11 PM MRN: [id number] Endoscopist: Ladene Artist , MD Age: 75 Referring MD:  Date of Birth: [birth year] Gender: Female Account #: [id number] Procedure:                Upper GI endoscopy Indications:              Upper abdominal symptoms that persist despite an                            appropriate trial of therapy, Heme positive stool,                            weight loss Medicines:                Monitored Anesthesia Care Procedure:                Pre-Anesthesia Assessment:                           - Prior to the procedure, a History and Physical                            was performed, and patient medications and                            allergies were reviewed. The patient's tolerance of                            previous anesthesia was also reviewed. The risks                            and benefits of the procedure and the sedation                            options and risks were discussed with the patient.                            All questions were answered, and informed consent                            was obtained. Prior Anticoagulants: The patient has                            taken no previous anticoagulant or antiplatelet                            agents. ASA Grade Assessment: II - A patient with                            mild systemic disease. After reviewing the risks                            and benefits, the patient was deemed in  satisfactory condition to undergo the procedure.                           After obtaining informed consent, the endoscope was                            passed under direct vision. Throughout the                            procedure, the patient's blood pressure, pulse, and                            oxygen saturations were monitored continuously. The                            Model GIF-HQ190 [id number]) scope was  introduced                            through the mouth, and advanced to the second part                            of duodenum. The upper GI endoscopy was                            accomplished without difficulty. The patient                            tolerated the procedure well. Scope In: Scope Out: Findings:                 The examined esophagus was normal.                           A small hiatal hernia was present.                           The exam of the stomach was otherwise normal.                           The duodenal bulb and second portion of the                            duodenum were normal. Complications:            No immediate complications. Estimated Blood Loss:     Estimated blood loss: none. Impression:               - Normal esophagus.                           - Small hiatal hernia.                           - Normal duodenal bulb and second portion of the                            duodenum.                           -  No specimens collected. Recommendation:           - Patient has a contact number available for                            emergencies. The signs and symptoms of potential                            delayed complications were discussed with the                            patient. Return to normal activities tomorrow.                            Written discharge instructions were provided to the                            patient.                           - Resume previous diet.                           - Continue present medications. Ladene Artist, MD 04/09/2016 3:51:31 PM This report has been signed electronically.

## 2016-04-09 NOTE — Progress Notes (Signed)
Dental advisory given to patient 

## 2016-04-09 NOTE — Progress Notes (Signed)
Called to room to assist during endoscopic procedure.  Patient ID and intended procedure confirmed with present staff. Received instructions for my participation in the procedure from the performing physician.  

## 2016-04-09 NOTE — Op Note (Addendum)
[facility] Patient Name: [name] Procedure Date: 04/09/2016 3:11 PM MRN: [id number] Endoscopist: Ladene Artist , MD Age: 75 Referring MD:  Date of Birth: 01-Jun-1941 Gender: Female Account #: [id number] Procedure:                Colonoscopy Indications:              Evaluation of unexplained GI bleeding, Heme                            positive stool, family history of colon cancer Medicines:                Monitored Anesthesia Care Procedure:                Pre-Anesthesia Assessment:                           - Prior to the procedure, a History and Physical                            was performed, and patient medications and                            allergies were reviewed. The patient's tolerance of                            previous anesthesia was also reviewed. The risks                            and benefits of the procedure and the sedation                            options and risks were discussed with the patient.                            All questions were answered, and informed consent                            was obtained. Prior Anticoagulants: The patient has                            taken no previous anticoagulant or antiplatelet                            agents. ASA Grade Assessment: II - A patient with                            mild systemic disease. After reviewing the risks                            and benefits, the patient was deemed in                            satisfactory condition to undergo the procedure.  After obtaining informed consent, the colonoscope                            was passed under direct vision. Throughout the                            procedure, the patient's blood pressure, pulse, and                            oxygen saturations were monitored continuously. The                            Model PCF-[id number]) scope was introduced                            through the anus  and advanced to the the cecum,                            identified by appendiceal orifice and ileocecal                            valve. The colonoscopy was performed without                            difficulty. The patient tolerated the procedure                            well. The quality of the bowel preparation was                            good. The ileocecal valve, appendiceal orifice, and                            rectum were photographed. Scope In: 3:22:14 PM Scope Out: 3:37:42 PM Scope Withdrawal Time: 0 hours 10 minutes 51 seconds  Total Procedure Duration: 0 hours 15 minutes 28 seconds  Findings:                 The digital rectal exam findings include                            non-thrombosed external hemorrhoids/tags.                           A 5 mm polyp was found in the sigmoid colon. The                            polyp was sessile. The polyp was removed with a                            cold biopsy forceps. Resection and retrieval were                            complete.  A few medium-mouthed diverticula were found in the                            sigmoid colon. There was no evidence of                            diverticular bleeding.                           Internal hemorrhoids were found during                            retroflexion. The hemorrhoids were medium-sized and                            Grade I (internal hemorrhoids that do not prolapse).                           The exam was otherwise normal throughout the                            examined colon. Complications:            No immediate complications. Estimated Blood Loss:     Estimated blood loss: none. Impression:               - Non-thrombosed external hemorrhoids/tags found on                            digital rectal exam.                           - One 5 mm polyp in the sigmoid colon, removed with                            a cold biopsy forceps. Resected and  retrieved.                           - Mild diverticulosis in the sigmoid colon. There                            was no evidence of diverticular bleeding.                           - Internal hemorrhoids. Recommendation:           - Patient has a contact number available for                            emergencies. The signs and symptoms of potential                            delayed complications were discussed with the                            patient. Return to normal activities tomorrow.  Written discharge instructions were provided to the                            patient.                           - Resume previous diet.                           - Continue present medications.                           - Await pathology results.                           - Repeat colonoscopy in 5 years for surveillance if                            polyp is precancerous, otherwise no plans for                            future screening colonoscopy due to age. Ladene Artist, MD 04/09/2016 3:47:09 PM This report has been signed electronically.

## 2016-04-09 NOTE — Patient Instructions (Signed)

## 2016-04-10 ENCOUNTER — Telehealth: Payer: Self-pay

## 2016-04-10 NOTE — Telephone Encounter (Signed)
  Follow up Call-  Call back number 04/09/2016  Post procedure Call Back phone  # [phone]  Permission to leave phone message Yes     Patient questions:  Do you have a fever, pain , or abdominal swelling? No. Pain Score  0 *  Have you tolerated food without any problems? Yes.    Have you been able to return to your normal activities? Yes.    Do you have any questions about your discharge instructions: Diet   No. Medications  No. Follow up visit  No.  Do you have questions or concerns about your Care? No.  Actions: * If pain score is 4 or above: No action needed, pain <4.

## 2016-04-15 ENCOUNTER — Encounter: Payer: Self-pay | Admitting: Gastroenterology

## 2016-04-28 ENCOUNTER — Ambulatory Visit (INDEPENDENT_AMBULATORY_CARE_PROVIDER_SITE_OTHER): Payer: Medicare Other | Admitting: Primary Care

## 2016-04-28 ENCOUNTER — Encounter: Payer: Self-pay | Admitting: Primary Care

## 2016-04-28 VITALS — BP 142/70 | HR 64 | Temp 98.1°F | Ht 67.0 in | Wt 206.8 lb

## 2016-04-28 DIAGNOSIS — J069 Acute upper respiratory infection, unspecified: Secondary | ICD-10-CM

## 2016-04-28 MED ORDER — AMOXICILLIN 500 MG PO CAPS: 500.0000 mg | ORAL_CAPSULE | Freq: Two times a day (BID) | ORAL | Status: DC

## 2016-04-28 NOTE — Progress Notes (Signed)
Subjective:    Patient ID: [name], female    DOB: [birth year], 75 y.o.   MRN: [id number]  HPI  [name] is a 75 year old female who presents today with a chief complaint of cough. She also reports sore throat, ear fullness, nasal congestion, sneezing, fatigue. Her symptoms started about 1 week ago while traveling to [place]. Denies fevers. Her cough is non productive. Overall she's feeling improved, but is not feeling herself. She's taking Delsym, Claritin, and Flonase with temporary improvement. No sick contacts.  Review of Systems  Constitutional: Positive for chills and fatigue. Negative for fever.  HENT: Positive for congestion, ear pain, postnasal drip, sinus pressure and sore throat.   Respiratory: Positive for cough. Negative for shortness of breath.   Cardiovascular: Negative for chest pain.  Musculoskeletal: Negative for myalgias.       Past Medical History  Diagnosis Date  . RA (rheumatoid arthritis) ([place])   . Allergic rhinitis   . Osteoarthritis   . Gastritis   . Hyperlipemia   . Former smoker   . Hemorrhoids   . Personal history of colonic polyps 1998    hyperplastic  . Esophageal reflux   . Fibula fracture 02/2005  . History of nephrolithiasis   . Shingles 2012  . Anxiety   . Heart murmur   . Diverticulosis   . Essential hypertension 01/07/2016  . Cataract 2015    bilateral     Social History   Social History  . Marital Status: Married    Spouse Name: N/A  . Number of Children: 3  . Years of Education: N/A   Occupational History  . retired   .     Social History Main Topics  . Smoking status: Former Smoker    Quit date: 10/21/2003  . Smokeless tobacco: Never Used  . Alcohol Use: No  . Drug Use: No  . Sexual Activity: Yes   Other Topics Concern  . Not on file   Social History Narrative   Married      Retired      Occasional caffeine      No regular exercise    Past Surgical History  Procedure Laterality Date  .  Cholecystectomy    . Vaginal polyp excised  12/2000    benign  . Lipoma excision  11/2002  . Svt s/p surgery--? ablation    . Ep procedure  1994    SVT; normal echo  . Esophagogastroduodenoscopy  11/03    reactive gastropathy  . Colonoscopy  11/08    internal hemorrhoids  . Upper gastrointestinal endoscopy      Family History  Problem Relation Age of Onset  . Colon cancer Father 55  . Diabetes Mother   . Heart disease Mother   . Kidney disease Mother     ESRD  . Stroke Mother   . Hypertension Brother   . Breast cancer Sister   . Diabetes Other     nephews  . Colon cancer Sister   . Esophageal cancer Neg Hx   . Stomach cancer Neg Hx   . Rectal cancer Neg Hx     Allergies  Allergen Reactions  . Bee Venom Anaphylaxis  . Cetirizine Hcl     REACTION: does not work  . Hctz [Hydrochlorothiazide] Other (See Comments)    Felt weak all over     Current Outpatient Prescriptions on File Prior to Visit  Medication Sig Dispense Refill  . ALPRAZolam (XANAX) 0.25 MG  tablet Take 1/2 to 1 tablet prior to the procedure. 1 tablet 0  . amLODipine (NORVASC) 5 MG tablet Take 1 tablet (5 mg total) by mouth daily. 30 tablet 11  . Calcium & Magnesium Carbonates (MYLANTA PO) Take 1 capsule by mouth as directed.      . dicyclomine (BENTYL) 10 MG capsule TAKE 1 CAPSULE (10 MG TOTAL) BY MOUTH 2 (TWO) TIMES DAILY AS NEEDED (SPASMS AND CRAMPING). (Patient taking differently: Take 10 mg by mouth 2 (two) times daily as needed for spasms. ) 180 capsule 1  . EPINEPHrine (EPIPEN IJ) Inject as directed as needed. Reported on 12/05/2015    . fluticasone (FLONASE) 50 MCG/ACT nasal spray PLACE 2 SPRAYS INTO THE NOSE DAILY. 16 g 5  . loratadine (CLARITIN) 10 MG tablet Take 10 mg by mouth daily.    . methotrexate (RHEUMATREX) 2.5 MG tablet Take 10 mg by mouth once a week. Caution:Chemotherapy. Protect from light.    Marland Kitchen NEXIUM 40 MG capsule TAKE 1 CAPSULE BY MOUTH TWICE DAILY 60 capsule 5  . ondansetron  (ZOFRAN-ODT) 4 MG disintegrating tablet TAKE 1 TABLET BY MOUTH EVERY 8 HOURS AS NEEDED FOR NAUSEA/VOMIT 30 tablet 3  . polyethylene glycol (MIRALAX / GLYCOLAX) packet Take 17 g by mouth daily.    . ranitidine (ZANTAC) 150 MG capsule Take 1 capsule (150 mg total) by mouth 2 (two) times daily. 60 capsule 11   No current facility-administered medications on file prior to visit.    BP 142/70 mmHg  Pulse 64  Temp(Src) 98.1 F (36.7 C) (Oral)  Ht 5\' 7"  (1.702 m)  Wt 206 lb 12.8 oz (93.804 kg)  BMI 32.38 kg/m2  SpO2 96%    Objective:   Physical Exam  Constitutional: She appears well-nourished. She does not appear ill.  HENT:  Right Ear: Tympanic membrane and ear canal normal.  Left Ear: Tympanic membrane and ear canal normal.  Nose: Right sinus exhibits frontal sinus tenderness. Right sinus exhibits no maxillary sinus tenderness. Left sinus exhibits frontal sinus tenderness. Left sinus exhibits no maxillary sinus tenderness.  Mouth/Throat: Oropharynx is clear and moist.  Eyes: Conjunctivae are normal.  Neck: Neck supple.  Cardiovascular: Normal rate and regular rhythm.   Pulmonary/Chest: Effort normal and breath sounds normal. She has no wheezes. She has no rales.  Lymphadenopathy:    She has no cervical adenopathy.  Skin: Skin is warm and dry.          Assessment & Plan:  URI:  Cough, fatigue, chills, ear discomfort, PND x 8 days. Overall feeling slightly improved. Temporary improvement with OTC. Exam today with clear lungs, moderate sinus tenderness to frontal sinuses, does not appear acutely ill. Suspect viral involvement that should resolve in the next 2-3 days, but given duration of symptoms will print RX for amoxil and have her take if no improvement Wednesday/Thursday this week. Continue Flonase, Claritin, Delsym for now. Return precautions provided.  Sheral Flow, NP

## 2016-04-28 NOTE — Progress Notes (Signed)
Pre visit review using our clinic review tool, if applicable. No additional management support is needed unless otherwise documented below in the visit note. 

## 2016-04-28 NOTE — Patient Instructions (Signed)
Your symptoms represent a viral infection that will go away on its own in a few days.  Start amoxicillin antibiotics on Wednesday if symptoms become worse, or if no improvement. Take 1 tablet by mouth twice daily for 7 days.  Continue Flonase, Claritin, and Delsym for now. Ensure you are staying hydrated with water to prevent dehydration.  It was a pleasure meeting you!  Upper Respiratory Infection, Adult Most upper respiratory infections (URIs) are a viral infection of the air passages leading to the lungs. A URI affects the nose, throat, and upper air passages. The most common type of URI is nasopharyngitis and is typically referred to as "the common cold." URIs run their course and usually go away on their own. Most of the time, a URI does not require medical attention, but sometimes a bacterial infection in the upper airways can follow a viral infection. This is called a secondary infection. Sinus and middle ear infections are common types of secondary upper respiratory infections. Bacterial pneumonia can also complicate a URI. A URI can worsen asthma and chronic obstructive pulmonary disease (COPD). Sometimes, these complications can require emergency medical care and may be life threatening.  CAUSES Almost all URIs are caused by viruses. A virus is a type of germ and can spread from one person to another.  RISKS FACTORS You may be at risk for a URI if:   You smoke.   You have chronic heart or lung disease.  You have a weakened defense (immune) system.   You are very young or very old.   You have nasal allergies or asthma.  You work in crowded or poorly ventilated areas.  You work in health care facilities or schools. SIGNS AND SYMPTOMS  Symptoms typically develop 2-3 days after you come in contact with a cold virus. Most viral URIs last 7-10 days. However, viral URIs from the influenza virus (flu virus) can last 14-18 days and are typically more severe. Symptoms may include:     Runny or stuffy (congested) nose.   Sneezing.   Cough.   Sore throat.   Headache.   Fatigue.   Fever.   Loss of appetite.   Pain in your forehead, behind your eyes, and over your cheekbones (sinus pain).  Muscle aches.  DIAGNOSIS  Your health care provider may diagnose a URI by:  Physical exam.  Tests to check that your symptoms are not due to another condition such as:  Strep throat.  Sinusitis.  Pneumonia.  Asthma. TREATMENT  A URI goes away on its own with time. It cannot be cured with medicines, but medicines may be prescribed or recommended to relieve symptoms. Medicines may help:  Reduce your fever.  Reduce your cough.  Relieve nasal congestion. HOME CARE INSTRUCTIONS   Take medicines only as directed by your health care provider.   Gargle warm saltwater or take cough drops to comfort your throat as directed by your health care provider.  Use a warm mist humidifier or inhale steam from a shower to increase air moisture. This may make it easier to breathe.  Drink enough fluid to keep your urine clear or pale yellow.   Eat soups and other clear broths and maintain good nutrition.   Rest as needed.   Return to work when your temperature has returned to normal or as your health care provider advises. You may need to stay home longer to avoid infecting others. You can also use a face mask and careful hand washing to prevent  spread of the virus.  Increase the usage of your inhaler if you have asthma.   Do not use any tobacco products, including cigarettes, chewing tobacco, or electronic cigarettes. If you need help quitting, ask your health care provider. PREVENTION  The best way to protect yourself from getting a cold is to practice good hygiene.   Avoid oral or hand contact with people with cold symptoms.   Wash your hands often if contact occurs.  There is no clear evidence that vitamin C, vitamin E, echinacea, or exercise  reduces the chance of developing a cold. However, it is always recommended to get plenty of rest, exercise, and practice good nutrition.  SEEK MEDICAL CARE IF:   You are getting worse rather than better.   Your symptoms are not controlled by medicine.   You have chills.  You have worsening shortness of breath.  You have brown or red mucus.  You have yellow or brown nasal discharge.  You have pain in your face, especially when you bend forward.  You have a fever.  You have swollen neck glands.  You have pain while swallowing.  You have white areas in the back of your throat. SEEK IMMEDIATE MEDICAL CARE IF:   You have severe or persistent:  Headache.  Ear pain.  Sinus pain.  Chest pain.  You have chronic lung disease and any of the following:  Wheezing.  Prolonged cough.  Coughing up blood.  A change in your usual mucus.  You have a stiff neck.  You have changes in your:  Vision.  Hearing.  Thinking.  Mood. MAKE SURE YOU:   Understand these instructions.  Will watch your condition.  Will get help right away if you are not doing well or get worse.   This information is not intended to replace advice given to you by your health care provider. Make sure you discuss any questions you have with your health care provider.   Document Released: 04/01/2001 Document Revised: 02/20/2015 Document Reviewed: 01/11/2014 Elsevier Interactive Patient Education [employer].

## 2016-05-01 ENCOUNTER — Other Ambulatory Visit: Payer: Self-pay | Admitting: *Deleted

## 2016-05-01 MED ORDER — AMLODIPINE BESYLATE 5 MG PO TABS: 5.0000 mg | ORAL_TABLET | Freq: Every day | ORAL | Status: DC

## 2016-06-09 ENCOUNTER — Ambulatory Visit (INDEPENDENT_AMBULATORY_CARE_PROVIDER_SITE_OTHER)
Admission: RE | Admit: 2016-06-09 | Discharge: 2016-06-09 | Disposition: A | Discharge status: Home / Self Care | Payer: Medicare Other | Source: Ambulatory Visit | Attending: Family Medicine | Admitting: Family Medicine

## 2016-06-09 ENCOUNTER — Encounter: Payer: Self-pay | Admitting: Family Medicine

## 2016-06-09 ENCOUNTER — Ambulatory Visit (INDEPENDENT_AMBULATORY_CARE_PROVIDER_SITE_OTHER): Payer: Medicare Other | Admitting: Family Medicine

## 2016-06-09 ENCOUNTER — Telehealth: Payer: Self-pay | Admitting: Family Medicine

## 2016-06-09 VITALS — BP 140/68 | HR 70 | Temp 98.5°F | Ht 67.0 in | Wt 203.5 lb

## 2016-06-09 DIAGNOSIS — K589 Irritable bowel syndrome without diarrhea: Secondary | ICD-10-CM

## 2016-06-09 DIAGNOSIS — R05 Cough: Secondary | ICD-10-CM

## 2016-06-09 DIAGNOSIS — R059 Cough, unspecified: Secondary | ICD-10-CM | POA: Insufficient documentation

## 2016-06-09 MED ORDER — LEVOFLOXACIN 500 MG PO TABS: 500.0000 mg | ORAL_TABLET | Freq: Every day | ORAL | 0 refills | Status: DC

## 2016-06-09 MED ORDER — DICYCLOMINE HCL 10 MG PO CAPS: ORAL_CAPSULE | ORAL | 1 refills | Status: DC

## 2016-06-09 MED ORDER — ALBUTEROL SULFATE HFA 108 (90 BASE) MCG/ACT IN AERS: 2.0000 | INHALATION_SPRAY | RESPIRATORY_TRACT | 1 refills | Status: DC | PRN

## 2016-06-09 NOTE — Assessment & Plan Note (Addendum)
Ongoing since uri last mo  On imp with amoxicillin  Also some maxillary sinus tenderness cxr today- former smoker/also c/o wheezing If no source found will tx for sinusitis Consider albuterol mdi

## 2016-06-09 NOTE — Progress Notes (Signed)
Pre visit review using our clinic review tool, if applicable. No additional management support is needed unless otherwise documented below in the visit note. 

## 2016-06-09 NOTE — Assessment & Plan Note (Signed)
Had a relatively recent colonoscopy - was ok  Suspect IBS Refilled dicyclomine for cramping Update if worse or not improved Continue tx constipation

## 2016-06-09 NOTE — Telephone Encounter (Signed)
Pt notified of xray results and Dr. Marliss Coots comments and verbalized understanding. Pt will pick up meds

## 2016-06-09 NOTE — Telephone Encounter (Signed)
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

## 2016-06-09 NOTE — Patient Instructions (Addendum)
Use the flonase every day around the same time  Continue claritin if it helps Salt water nasal spray (saline) - helps congestion as well over the counter as needed Chest xray today- we will get the result to you later today and decide what we need to do next  I refilled dicyclomine for your cramping (likely from irritable bowel syndrome)  Only take it when you need it

## 2016-06-09 NOTE — Progress Notes (Signed)
Subjective:    Patient ID: [name], female    DOB: 08-30-1941, 75 y.o.   MRN: [id number]  HPI Here for f/u of URI she was seen for last month  Took the course of amoxicillin   Still feels chest tightness and wheezing-makes her uncomfortable Still coughing -not as much  Non productive  Barky dry cough - irritates her   Sneezing a little-not bad  No sinus pain  Stays congested in the head and feels like there is fluid in ears All nasal d/c is clear  Still using claritin and flonase spray   Last smoked 13 years ago  Nl cxr 3/16    Stomach still hurts on and off  Was doing better on acid reflux pill -nexium  Dicyclomine helped in the past - has been out of it for a while Now bothering her again  Eating a lot better  Not a lot of post nasal drip  Colonoscopy was nl in June with Dr Fuller Plan   Patient Active Problem List   Diagnosis Date Noted  . IBS (irritable bowel syndrome) 06/09/2016  . Cough 06/09/2016  . Abdominal pain, left lower quadrant 02/15/2016  . Abdominal pain, chronic, epigastric 02/15/2016  . Hyperglycemia 02/06/2016  . Essential hypertension 01/07/2016  . Gastritis 11/07/2015  . Fatty liver 11/07/2015  . Coronary atherosclerosis 11/07/2015  . Acute upper respiratory infection 04/02/2015  . Left shoulder pain 12/25/2014  . Left-sided chest wall pain 12/25/2014  . Fatigue 04/24/2014  . Obesity 04/24/2014  . Encounter for Medicare annual wellness exam 04/24/2014  . Post herpetic neuralgia 07/09/2011  . Low back pain 07/09/2011  . Left sided abdominal pain 06/12/2011  . Rheumatoid arthritis ([place]) 07/23/2007  . GERD 07/09/2007  . HYPERCHOLESTEROLEMIA 03/19/2007  . PSVT 03/19/2007  . ALLERGIC RHINITIS 03/19/2007  . ENDOMETRIAL POLYP 03/19/2007  . POSTMENOPAUSAL STATUS 03/19/2007  . OSTEOARTHRITIS 03/19/2007  . SPINAL STENOSIS 03/19/2007  . MURMUR 03/19/2007  . NEPHROLITHIASIS, HX OF 03/19/2007   Past Medical History:  Diagnosis Date  .  Allergic rhinitis   . Anxiety   . Cataract 2015   bilateral  . Diverticulosis   . Esophageal reflux   . Essential hypertension 01/07/2016  . Fibula fracture 02/2005  . Former smoker   . Gastritis   . Heart murmur   . Hemorrhoids   . History of nephrolithiasis   . Hyperlipemia   . Osteoarthritis   . Personal history of colonic polyps 1998   hyperplastic  . RA (rheumatoid arthritis) ([place])   . Shingles 2012   Past Surgical History:  Procedure Laterality Date  . CHOLECYSTECTOMY    . COLONOSCOPY  11/08   internal hemorrhoids  . EP procedure  1994   SVT; normal echo  . ESOPHAGOGASTRODUODENOSCOPY  11/03   reactive gastropathy  . LIPOMA EXCISION  11/2002  . SVT s/p surgery--? ablation    . UPPER GASTROINTESTINAL ENDOSCOPY    . vaginal polyp excised  12/2000   benign   Social History  Substance Use Topics  . Smoking status: Former Smoker    Quit date: 10/21/2003  . Smokeless tobacco: Never Used  . Alcohol use No   Family History  Problem Relation Age of Onset  . Colon cancer Father 37  . Diabetes Mother   . Heart disease Mother   . Kidney disease Mother     ESRD  . Stroke Mother   . Hypertension Brother   . Breast cancer Sister   . Diabetes Other  nephews  . Colon cancer Sister   . Esophageal cancer Neg Hx   . Stomach cancer Neg Hx   . Rectal cancer Neg Hx    Allergies  Allergen Reactions  . Bee Venom Anaphylaxis  . Cetirizine Hcl     REACTION: does not work  . Hctz [Hydrochlorothiazide] Other (See Comments)    Felt weak all over    Current Outpatient Prescriptions on File Prior to Visit  Medication Sig Dispense Refill  . ALPRAZolam (XANAX) 0.25 MG tablet Take 1/2 to 1 tablet prior to the procedure. 1 tablet 0  . amLODipine (NORVASC) 5 MG tablet Take 1 tablet (5 mg total) by mouth daily. 90 tablet 2  . Calcium & Magnesium Carbonates (MYLANTA PO) Take 1 capsule by mouth as directed.      Marland Kitchen EPINEPHrine (EPIPEN IJ) Inject as directed as needed. Reported on  12/05/2015    . fluticasone (FLONASE) 50 MCG/ACT nasal spray PLACE 2 SPRAYS INTO THE NOSE DAILY. 16 g 5  . loratadine (CLARITIN) 10 MG tablet Take 10 mg by mouth daily.    . methotrexate (RHEUMATREX) 2.5 MG tablet Take 10 mg by mouth once a week. Caution:Chemotherapy. Protect from light.    Marland Kitchen NEXIUM 40 MG capsule TAKE 1 CAPSULE BY MOUTH TWICE DAILY 60 capsule 5  . ondansetron (ZOFRAN-ODT) 4 MG disintegrating tablet TAKE 1 TABLET BY MOUTH EVERY 8 HOURS AS NEEDED FOR NAUSEA/VOMIT 30 tablet 3  . polyethylene glycol (MIRALAX / GLYCOLAX) packet Take 17 g by mouth daily.    . ranitidine (ZANTAC) 150 MG capsule Take 1 capsule (150 mg total) by mouth 2 (two) times daily. 60 capsule 11   No current facility-administered medications on file prior to visit.       Review of Systems  Constitutional: Positive for appetite change and fatigue. Negative for fever.  HENT: Positive for congestion, postnasal drip, rhinorrhea, sinus pressure, sneezing and sore throat. Negative for ear pain.   Eyes: Negative for pain and discharge.  Respiratory: Positive for cough. Negative for shortness of breath, wheezing and stridor.   Cardiovascular: Negative for chest pain.  Gastrointestinal: Positive for abdominal pain and constipation. Negative for anal bleeding, blood in stool, diarrhea, nausea and vomiting.  Genitourinary: Negative for frequency, hematuria and urgency.  Musculoskeletal: Negative for arthralgias and myalgias.  Skin: Negative for rash.  Neurological: Positive for headaches. Negative for dizziness, weakness and light-headedness.  Psychiatric/Behavioral: Negative for confusion and dysphoric mood.       Objective:   Physical Exam  Constitutional: She appears well-developed and well-nourished. No distress.  Well appearing   HENT:  Head: Normocephalic and atraumatic.  Right Ear: External ear normal.  Left Ear: External ear normal.  Mouth/Throat: Oropharynx is clear and moist. No oropharyngeal exudate.   Nares are injected and congested  Bilateral maxillary sinus tenderness  Post nasal drip   Eyes: Conjunctivae and EOM are normal. Pupils are equal, round, and reactive to light. Right eye exhibits no discharge. Left eye exhibits no discharge.  Neck: Normal range of motion. Neck supple.  Cardiovascular: Normal rate and regular rhythm.   Pulmonary/Chest: Effort normal and breath sounds normal. No respiratory distress. She has no wheezes. She has no rales.  Harsh bs with no wheeze and good air exch Not coughing in room  Abdominal: She exhibits no mass. There is tenderness. There is no rebound and no guarding.  Abdomen is diffusely tender w/o rebound No M Nl bs   Lymphadenopathy:    She has no  cervical adenopathy.  Neurological: She is alert. No cranial nerve deficit.  Skin: Skin is warm and dry. No rash noted.  Psychiatric: She has a normal mood and affect.          Assessment & Plan:   Problem List Items Addressed This Visit      Digestive   IBS (irritable bowel syndrome)    Had a relatively recent colonoscopy - was ok  Suspect IBS Refilled dicyclomine for cramping Update if worse or not improved Continue tx constipation       Relevant Medications   dicyclomine (BENTYL) 10 MG capsule     Other   Cough    Ongoing since uri last mo  On imp with amoxicillin  Also some maxillary sinus tenderness cxr today- former smoker/also c/o wheezing If no source found will tx for sinusitis Consider albuterol mdi       Relevant Orders   DG Chest 2 View (Completed)    Other Visit Diagnoses   None.

## 2016-06-24 ENCOUNTER — Encounter: Payer: Self-pay | Admitting: Family Medicine

## 2016-06-24 ENCOUNTER — Telehealth: Payer: Self-pay | Admitting: *Deleted

## 2016-06-24 ENCOUNTER — Ambulatory Visit (INDEPENDENT_AMBULATORY_CARE_PROVIDER_SITE_OTHER): Payer: Medicare Other | Admitting: Family Medicine

## 2016-06-24 VITALS — BP 130/70 | HR 81 | Temp 98.4°F | Ht 67.0 in | Wt 201.5 lb

## 2016-06-24 DIAGNOSIS — F4323 Adjustment disorder with mixed anxiety and depressed mood: Secondary | ICD-10-CM | POA: Insufficient documentation

## 2016-06-24 DIAGNOSIS — K219 Gastro-esophageal reflux disease without esophagitis: Secondary | ICD-10-CM

## 2016-06-24 MED ORDER — FLUOXETINE HCL 20 MG PO TABS: ORAL_TABLET | ORAL | 11 refills | Status: DC

## 2016-06-24 MED ORDER — ESCITALOPRAM OXALATE 20 MG PO TABS: 20.0000 mg | ORAL_TABLET | Freq: Every day | ORAL | 11 refills | Status: DC

## 2016-06-24 NOTE — Progress Notes (Signed)
Subjective:    Patient ID: [name], female    DOB: February 05, 1941, 74 y.o.   MRN: [id number]  HPI Last visit pt was tx with levaquin for a sinus infx and albuterol inhaler for wheezing (with signs of copd on her cxr) Does not think the levaquin helped  Albuterol did not help her wheezing - does not help at all    She gets chest pain/ feels like acid reflux  She has no appetite  Feels like food stops 1/2 way down  Sore in chest  Is interested in seeing GI again   Pt feels generally weak all over and tired  Stomach stays swollen (saw GI for this) - never found anything - EGD normal  Tried dicyclomine again-not helpful occ headache   She takes nausea medicine - only about once per month   Takes methotrexate once per week  ? If this causes any symptoms  She does hurt in xiphoid process   Husband wonders if she is depressed   Sometimes she "might" feel anxious  Esp when sob  Has to take deep breaths (no trouble getting air in or out)  This bothers her and then she "gets to crying"  Cannot do what she wants to - for instance going to church or bathing  Was worried about grandkids in [place] during the [place]   ? If depression  She is by herself a lot - cries a lot  Is sad at times ? If hopeless No SI  She wakes up at 4 am- watches tv and then tries to go back to sleep  Sometime she thinks about dying /worried about dying Worried if something happened no one would find her   Has been on med before - ? prozac  Thinks she would like to try it    Dg Chest 2 View  Result Date: 06/09/2016 CLINICAL DATA:  Coughing and wheezing since last month EXAM: CHEST  2 VIEW COMPARISON:  Portable chest x-ray of November 02, 2015 and chest CT scan of the same date. FINDINGS: The lungs are hyperinflated. There is no focal infiltrate. There is no pleural effusion. The heart and pulmonary vascularity are normal. The mediastinum is normal in width. There is calcification in the wall of the  aortic arch. There is mild multilevel degenerative disc disease of the thoracic spine. IMPRESSION: COPD. There is no pneumonia nor other acute cardiopulmonary abnormality. Aortic atherosclerosis. Electronically Signed   By: David  Martinique M.D.   On: 06/09/2016 13:03     Patient Active Problem List   Diagnosis Date Noted  . Adjustment disorder with mixed anxiety and depressed mood 06/24/2016  . IBS (irritable bowel syndrome) 06/09/2016  . Cough 06/09/2016  . Abdominal pain, left lower quadrant 02/15/2016  . Abdominal pain, chronic, epigastric 02/15/2016  . Hyperglycemia 02/06/2016  . Essential hypertension 01/07/2016  . Gastritis 11/07/2015  . Fatty liver 11/07/2015  . Coronary atherosclerosis 11/07/2015  . Left shoulder pain 12/25/2014  . Left-sided chest wall pain 12/25/2014  . Fatigue 04/24/2014  . Obesity 04/24/2014  . Encounter for Medicare annual wellness exam 04/24/2014  . Post herpetic neuralgia 07/09/2011  . Low back pain 07/09/2011  . Left sided abdominal pain 06/12/2011  . Rheumatoid arthritis ([place]) 07/23/2007  . GERD 07/09/2007  . HYPERCHOLESTEROLEMIA 03/19/2007  . PSVT 03/19/2007  . ALLERGIC RHINITIS 03/19/2007  . ENDOMETRIAL POLYP 03/19/2007  . POSTMENOPAUSAL STATUS 03/19/2007  . OSTEOARTHRITIS 03/19/2007  . SPINAL STENOSIS 03/19/2007  . MURMUR 03/19/2007  .  NEPHROLITHIASIS, HX OF 03/19/2007   Past Medical History:  Diagnosis Date  . Allergic rhinitis   . Anxiety   . Cataract 2015   bilateral  . Diverticulosis   . Esophageal reflux   . Essential hypertension 01/07/2016  . Fibula fracture 02/2005  . Former smoker   . Gastritis   . Heart murmur   . Hemorrhoids   . History of nephrolithiasis   . Hyperlipemia   . Osteoarthritis   . Personal history of colonic polyps 1998   hyperplastic  . RA (rheumatoid arthritis) ([place])   . Shingles 2012   Past Surgical History:  Procedure Laterality Date  . CHOLECYSTECTOMY    . COLONOSCOPY  11/08   internal  hemorrhoids  . EP procedure  1994   SVT; normal echo  . ESOPHAGOGASTRODUODENOSCOPY  11/03   reactive gastropathy  . LIPOMA EXCISION  11/2002  . SVT s/p surgery--? ablation    . UPPER GASTROINTESTINAL ENDOSCOPY    . vaginal polyp excised  12/2000   benign   Social History  Substance Use Topics  . Smoking status: Former Smoker    Quit date: 10/21/2003  . Smokeless tobacco: Never Used  . Alcohol use No   Family History  Problem Relation Age of Onset  . Colon cancer Father 9  . Diabetes Mother   . Heart disease Mother   . Kidney disease Mother     ESRD  . Stroke Mother   . Hypertension Brother   . Breast cancer Sister   . Diabetes Other     nephews  . Colon cancer Sister   . Esophageal cancer Neg Hx   . Stomach cancer Neg Hx   . Rectal cancer Neg Hx    Allergies  Allergen Reactions  . Bee Venom Anaphylaxis  . Cetirizine Hcl     REACTION: does not work  . Hctz [Hydrochlorothiazide] Other (See Comments)    Felt weak all over    Current Outpatient Prescriptions on File Prior to Visit  Medication Sig Dispense Refill  . albuterol (PROVENTIL HFA;VENTOLIN HFA) 108 (90 Base) MCG/ACT inhaler Inhale 2 puffs into the lungs every 4 (four) hours as needed for wheezing or shortness of breath. Please give spacer if possible 1 Inhaler 1  . amLODipine (NORVASC) 5 MG tablet Take 1 tablet (5 mg total) by mouth daily. 90 tablet 2  . Calcium & Magnesium Carbonates (MYLANTA PO) Take 1 capsule by mouth as directed.      Marland Kitchen EPINEPHrine (EPIPEN IJ) Inject as directed as needed. Reported on 12/05/2015    . fluticasone (FLONASE) 50 MCG/ACT nasal spray PLACE 2 SPRAYS INTO THE NOSE DAILY. 16 g 5  . loratadine (CLARITIN) 10 MG tablet Take 10 mg by mouth daily.    . methotrexate (RHEUMATREX) 2.5 MG tablet Take 10 mg by mouth once a week. Caution:Chemotherapy. Protect from light.    Marland Kitchen NEXIUM 40 MG capsule TAKE 1 CAPSULE BY MOUTH TWICE DAILY 60 capsule 5  . ondansetron (ZOFRAN-ODT) 4 MG disintegrating  tablet TAKE 1 TABLET BY MOUTH EVERY 8 HOURS AS NEEDED FOR NAUSEA/VOMIT 30 tablet 3  . polyethylene glycol (MIRALAX / GLYCOLAX) packet Take 17 g by mouth daily.    . ranitidine (ZANTAC) 150 MG capsule Take 1 capsule (150 mg total) by mouth 2 (two) times daily. 60 capsule 11   No current facility-administered medications on file prior to visit.     Review of Systems Review of Systems  Constitutional: Negative for fever,  and unexpected  weight change. pos for fatigue and dec appetite  Eyes: Negative for pain and visual disturbance.  Respiratory: Negative for cough and pos for shortness of breath.   Cardiovascular: Negative for cp or palpitations    Gastrointestinal: Negative for nausea, diarrhea and constipation. pos for heartburn and dysphagia , pos for low abd cramping  Genitourinary: Negative for urgency and frequency.  Skin: Negative for pallor or rash   Neurological: Negative for weakness, light-headedness, numbness and headaches.  Hematological: Negative for adenopathy. Does not bruise/bleed easily.  Psychiatric/Behavioral: pos for dysphoric and anxious mood and frequent tearfulness, neg for SI.         Objective:   Physical Exam  Constitutional: She appears well-developed and well-nourished. No distress.  overwt and well app Tearful at times   HENT:  Head: Normocephalic and atraumatic.  Mouth/Throat: Oropharynx is clear and moist.  Eyes: Conjunctivae and EOM are normal. Pupils are equal, round, and reactive to light.  Neck: Normal range of motion. Neck supple. No JVD present. Carotid bruit is not present. No thyromegaly present.  Cardiovascular: Normal rate, regular rhythm, normal heart sounds and intact distal pulses.  Exam reveals no gallop.   Pulmonary/Chest: Effort normal and breath sounds normal. No respiratory distress. She has no wheezes. She has no rales.  No crackles  Abdominal: Soft. Bowel sounds are normal. She exhibits no distension, no abdominal bruit, no pulsatile  midline mass and no mass. There is tenderness in the epigastric area. There is no rigidity, no rebound, no guarding, no CVA tenderness, no tenderness at McBurney's point and negative Murphy's sign.  Mild epigastric tenderness w/o rebound or guarding   Musculoskeletal: She exhibits no edema.  Lymphadenopathy:    She has no cervical adenopathy.  Neurological: She is alert. She has normal reflexes. No cranial nerve deficit. She exhibits normal muscle tone. Coordination normal.  Skin: Skin is warm and dry. No rash noted.  Psychiatric: Her speech is normal and behavior is normal. Thought content normal. Her mood appears anxious. Her affect is not blunt, not labile and not inappropriate. Thought content is not paranoid. Cognition and memory are normal. She exhibits a depressed mood. She expresses no homicidal and no suicidal ideation.  Often tearful during interview          Assessment & Plan:   Problem List Items Addressed This Visit      Digestive   GERD - Primary    Recent re assuring EGD and colonoscopy Pt still feels like food is getting stuck shortly after she swallows and is not able to eat much  Will schedule f/u with GI to discuss this and review her studies       Relevant Orders   Ambulatory referral to Gastroenterology     Other   Adjustment disorder with mixed anxiety and depressed mood    Long disc re: mood and also its possible link to physical symptoms  Husband agrees  Reviewed stressors/ coping techniques/symptoms/ support sources/ tx options and side effects in detail today  She has symptoms of both dep and anxiety  Wishes to try ssri again (ins did not cover prozac so will try lexapro- 10 mg to titrate to 20) Discussed expectations of SSRI medication including time to effectiveness and mechanism of action, also poss of side effects (early and late)- including mental fuzziness, weight or appetite change, nausea and poss of worse dep or anxiety (even suicidal thoughts)   Pt voiced understanding and will stop med and update if this occurs  Also ref to counseling Pt is tearful today >25 minutes spent in face to face time with patient, >50% spent in counselling or coordination of care F/u planned approx 3 mo or earlier if needed No SI      Relevant Orders   Ambulatory referral to Psychology    Other Visit Diagnoses   None.

## 2016-06-24 NOTE — Patient Instructions (Signed)
Stop at check out for referral to GI and also counseling Start prozac at 1/2 pill daily for 2 weeks and then increase to one pill daily if no side effects  If you feel worse or any suicidal thoughts - stop the medicine/ let us know and get help  Follow up with me in about 3 months

## 2016-06-24 NOTE — Telephone Encounter (Signed)
Pt notified Rx sent to pharmacy, pharmacy notified to change Rx, and pt will stick with same directions of taking 1/2 tab x2 weeks 1st

## 2016-06-24 NOTE — Telephone Encounter (Signed)
Received a fax saying that pt's fluoxetine isn't covered by pt's insurance without a PA, the letter says the alt meds pt has to have tried are citalopram and escitalopram, do you want to change Rx to one of these Rxs or should I try to start a PA for the fluoxetine, please advise

## 2016-06-24 NOTE — Assessment & Plan Note (Signed)
Recent re assuring EGD and colonoscopy Pt still feels like food is getting stuck shortly after she swallows and is not able to eat much  Will schedule f/u with GI to discuss this and review her studies

## 2016-06-24 NOTE — Progress Notes (Signed)
Pre visit review using our clinic review tool, if applicable. No additional management support is needed unless otherwise documented below in the visit note. 

## 2016-06-24 NOTE — Assessment & Plan Note (Signed)
Long disc re: mood and also its possible link to physical symptoms  Husband agrees  Reviewed stressors/ coping techniques/symptoms/ support sources/ tx options and side effects in detail today  She has symptoms of both dep and anxiety  Wishes to try ssri again (ins did not cover prozac so will try lexapro- 10 mg to titrate to 20) Discussed expectations of SSRI medication including time to effectiveness and mechanism of action, also poss of side effects (early and late)- including mental fuzziness, weight or appetite change, nausea and poss of worse dep or anxiety (even suicidal thoughts)  Pt voiced understanding and will stop med and update if this occurs   Also ref to counseling Pt is tearful today >25 minutes spent in face to face time with patient, >50% spent in counselling or coordination of care F/u planned approx 3 mo or earlier if needed No SI

## 2016-06-24 NOTE — Telephone Encounter (Signed)
Let's try generic lexapro  Start with 1/2 pill daily for 2 weeks and then go up to 1 pill daily  The directions will say take one daily-make sure she understands to start with 1/2 pill first  Same side effect profile as we discussed applies  Any problems-please let me know

## 2016-07-01 ENCOUNTER — Encounter (INDEPENDENT_AMBULATORY_CARE_PROVIDER_SITE_OTHER): Payer: Self-pay

## 2016-07-01 ENCOUNTER — Encounter: Payer: Self-pay | Admitting: Gastroenterology

## 2016-07-01 ENCOUNTER — Ambulatory Visit (INDEPENDENT_AMBULATORY_CARE_PROVIDER_SITE_OTHER): Payer: Medicare Other | Admitting: Gastroenterology

## 2016-07-01 VITALS — BP 140/80 | HR 72 | Ht 67.0 in | Wt 202.1 lb

## 2016-07-01 DIAGNOSIS — R079 Chest pain, unspecified: Secondary | ICD-10-CM

## 2016-07-01 DIAGNOSIS — R1013 Epigastric pain: Secondary | ICD-10-CM | POA: Diagnosis not present

## 2016-07-01 NOTE — Patient Instructions (Signed)
Take Tylenol three times a day x 1 week for your musculoskeletal pain.  Thank you for choosing me and [facility].  Pricilla Riffle. Dagoberto Ligas., MD., Marval Regal

## 2016-07-01 NOTE — Progress Notes (Signed)
    History of Present Illness: This is a 75 year old female returning for complaints of chest pain, abdominal pain and weight loss. She is accompanied by her husband. She has had an extensive gastrointestinal evaluation as summarized below. She has a history of musculoskeletal chest and abdominal pain. She is not taking medications for pain management. She complains of a poor appetite that is leading to weight loss  EGD 03/2016: small HH, otherwise normal Colonoscopy 03/2016: mixed hemorrhoids, mild diverticulosis, one small hyperplastic polyp  CMP, CBC, amylase, lipase, TSH - normal ESR minimally elevated at 33 CT abd/pelvis 10/2015: Fatty liver, atherosclerosis sclerosis of the aorta and branch vessels, uterine fibroid, postcholecystectomy CT angio chest 10/2015: Patchy lower lobe atelectasis focal coronary artery calcifications left ventricular hypertrophy Abd [place] 10/2015: Fatty liver disease, postcholecystectomy  Current Medications, Allergies, Past Medical History, Past Surgical History, Family History and Social History were reviewed in [employer] record.  Physical Exam: General: Well developed, well nourished, no acute distress Head: Normocephalic and atraumatic Eyes:  sclerae anicteric, EOMI Ears: Normal auditory acuity Mouth: No deformity or lesions Lungs: Clear throughout to auscultation Chest: Markedly anterior chest wall tenderness Heart: Regular rate and rhythm; no murmurs, rubs or bruits Abdomen: Soft, tenderness across upper abdomen to very light palpation and non distended. No masses, hepatosplenomegaly or hernias noted. Normal Bowel sounds Musculoskeletal: Symmetrical with no gross deformities  Pulses:  Normal pulses noted Extremities: No clubbing, cyanosis, edema or deformities noted Neurological: Alert oriented x 4, grossly nonfocal Psychological:  Alert and cooperative. Depressed affect  Assessment and Recommendations:  1. Musculoskeletal chest  pain, abdominal wall pain. No active gastrointestinal problems have been identified. Depression and anxiety contributing. Begin Tylenol 3 times a day for pain mgmt. Lexapro was recently started by Dr. Glori Bickers. She may need additional medications or treatments for these problem. Defer further mgmt and follow-up to Dr. Glori Bickers.  2. GERD. Well controlled on daily Nexium.  3. Weight loss. No gastrointestinal cause identified. Likely related to anxiety and depression.  4. Fatty liver. Long-term weight loss, low-fat diet supervised by PCP.

## 2016-07-29 ENCOUNTER — Telehealth: Payer: Self-pay | Admitting: Family Medicine

## 2016-07-29 NOTE — Telephone Encounter (Signed)
-----   Message from [name], [place] sent at 07/23/2016 12:43 PM EDT ----- [name]:  We called your patient and she has declined to schedule an appointment with [place], at this time.  We appreciate the referral.

## 2016-07-29 NOTE — Telephone Encounter (Signed)
Patient has declined an appointment with any Counselor at this time.

## 2016-07-29 NOTE — Telephone Encounter (Signed)
Aware, thanks!

## 2016-08-27 ENCOUNTER — Telehealth: Payer: Self-pay | Admitting: Family Medicine

## 2016-08-27 NOTE — Telephone Encounter (Signed)
atient Name: [name]  DOB: October 23, 1940    Initial Comment Caller states she has itchy red bumps all over   Nurse Assessment  Nurse: Raphael Gibney, RN, Vanita Ingles Date/Time (Eastern Time): 08/27/2016 11:37:21 AM  Confirm and document reason for call. If symptomatic, describe symptoms. You must click the next button to save text entered. ---Caller states she has itchy red bumps on her legs, abd and a few on her back. No fever. Noticed rash last week. Has been using anti itch cream and a spray.  Has the patient traveled out of the country within the last 30 days? ---Not Applicable  Does the patient have any new or worsening symptoms? ---Yes  Will a triage be completed? ---Yes  Related visit to physician within the last 2 weeks? ---No  Does the PT have any chronic conditions? (i.e. diabetes, asthma, etc.) ---No  Is this a behavioral health or substance abuse call? ---No     Guidelines    Guideline Title Affirmed Question Affirmed Notes  Rash or Redness - Widespread SEVERE itching (i.e., interferes with sleep, normal activities or school)    Final Disposition User   See Physician within 24 Hours [place], RN, Vanita Ingles    Comments  appt scheduled for 10>45 am on 08/28/2016 with Dr. Arnette Norris   Referrals  REFERRED TO PCP OFFICE   Disagree/Comply: Comply

## 2016-08-27 NOTE — Telephone Encounter (Signed)
TH scheduled appt with Dr Deborra Medina on 11/09/17at 10:45.

## 2016-08-28 ENCOUNTER — Encounter: Payer: Self-pay | Admitting: Family Medicine

## 2016-08-28 ENCOUNTER — Ambulatory Visit (INDEPENDENT_AMBULATORY_CARE_PROVIDER_SITE_OTHER): Payer: Medicare Other | Admitting: Family Medicine

## 2016-08-28 VITALS — BP 146/82 | HR 80 | Temp 97.8°F | Wt 205.0 lb

## 2016-08-28 DIAGNOSIS — Z23 Encounter for immunization: Secondary | ICD-10-CM | POA: Diagnosis not present

## 2016-08-28 DIAGNOSIS — R21 Rash and other nonspecific skin eruption: Secondary | ICD-10-CM | POA: Insufficient documentation

## 2016-08-28 MED ORDER — DEXAMETHASONE SODIUM PHOSPHATE 10 MG/ML IJ SOLN
10.0000 mg | Freq: Once | INTRAMUSCULAR | Status: AC
  Administered 2016-08-28: 10 mg via INTRAMUSCULAR

## 2016-08-28 NOTE — Progress Notes (Signed)
Subjective:   Patient ID: [name], female    DOB: 1941-06-18, 75 y.o.   MRN: [id number]  [name] is a pleasant 75 y.o. year old female pt of Dr. Glori Bickers, new to me, who presents to clinic today with Rash (denies any lifestyles changes)  on 08/28/2016  HPI:  Complains of itchy, red bumps on her legs abdomen and back for 1 week. Has been using "anti itch cream and a spray."  Takes claritin daily.  Denies any changes in soaps, lotions or detergents.  No one in house has similar symptoms.  Not around children.  Current Outpatient Prescriptions on File Prior to Visit  Medication Sig Dispense Refill  . albuterol (PROVENTIL HFA;VENTOLIN HFA) 108 (90 Base) MCG/ACT inhaler Inhale 2 puffs into the lungs every 4 (four) hours as needed for wheezing or shortness of breath. Please give spacer if possible 1 Inhaler 1  . amLODipine (NORVASC) 5 MG tablet Take 1 tablet (5 mg total) by mouth daily. 90 tablet 2  . Calcium & Magnesium Carbonates (MYLANTA PO) Take 1 capsule by mouth as directed.      Marland Kitchen EPINEPHrine (EPIPEN IJ) Inject as directed as needed. Reported on 12/05/2015    . escitalopram (LEXAPRO) 20 MG tablet Take 1 tablet (20 mg total) by mouth daily. 30 tablet 11  . fluticasone (FLONASE) 50 MCG/ACT nasal spray PLACE 2 SPRAYS INTO THE NOSE DAILY. 16 g 5  . loratadine (CLARITIN) 10 MG tablet Take 10 mg by mouth daily.    . methotrexate (RHEUMATREX) 2.5 MG tablet Take 10 mg by mouth once a week. Caution:Chemotherapy. Protect from light.    Marland Kitchen NEXIUM 40 MG capsule TAKE 1 CAPSULE BY MOUTH TWICE DAILY 60 capsule 5  . ondansetron (ZOFRAN-ODT) 4 MG disintegrating tablet TAKE 1 TABLET BY MOUTH EVERY 8 HOURS AS NEEDED FOR NAUSEA/VOMIT 30 tablet 3  . polyethylene glycol (MIRALAX / GLYCOLAX) packet Take 17 g by mouth daily.    . ranitidine (ZANTAC) 150 MG capsule Take 1 capsule (150 mg total) by mouth 2 (two) times daily. 60 capsule 11   No current facility-administered medications on file  prior to visit.     Allergies  Allergen Reactions  . Bee Venom Anaphylaxis  . Cetirizine Hcl     REACTION: does not work  . Hctz [Hydrochlorothiazide] Other (See Comments)    Felt weak all over     Past Medical History:  Diagnosis Date  . Allergic rhinitis   . Anxiety   . Cataract 2015   bilateral  . Diverticulosis   . Esophageal reflux   . Essential hypertension 01/07/2016  . Fibula fracture 02/2005  . Former smoker   . Gastritis   . Heart murmur   . Hemorrhoids   . History of nephrolithiasis   . Hyperlipemia   . Osteoarthritis   . Personal history of colonic polyps 1998   hyperplastic  . RA (rheumatoid arthritis) ([place])   . Shingles 2012    Past Surgical History:  Procedure Laterality Date  . CHOLECYSTECTOMY    . COLONOSCOPY  11/08   internal hemorrhoids  . EP procedure  1994   SVT; normal echo  . ESOPHAGOGASTRODUODENOSCOPY  11/03   reactive gastropathy  . LIPOMA EXCISION  11/2002  . SVT s/p surgery--? ablation    . UPPER GASTROINTESTINAL ENDOSCOPY    . vaginal polyp excised  12/2000   benign    Family History  Problem Relation Age of Onset  . Colon cancer Father  86  . Diabetes Mother   . Heart disease Mother   . Kidney disease Mother     ESRD  . Stroke Mother   . Hypertension Brother   . Breast cancer Sister   . Diabetes Other     nephews  . Colon cancer Sister   . Esophageal cancer Neg Hx   . Stomach cancer Neg Hx   . Rectal cancer Neg Hx     Social History   Social History  . Marital status: Married    Spouse name: N/A  . Number of children: 3  . Years of education: N/A   Occupational History  . retired   .  Retired   Social History Main Topics  . Smoking status: Former Smoker    Quit date: 10/21/2003  . Smokeless tobacco: Never Used  . Alcohol use No  . Drug use: No  . Sexual activity: Yes   Other Topics Concern  . Not on file   Social History Narrative   Married      Retired      Occasional caffeine      No regular  exercise   The PMH, PSH, Social History, Family History, Medications, and allergies have been reviewed in [facility], and have been updated if relevant.   Review of Systems  Skin: Positive for rash.  All other systems reviewed and are negative.      Objective:    BP (!) 146/82   Pulse 80   Temp 97.8 F (36.6 C) (Oral)   Wt 205 lb (93 kg)   SpO2 95%   BMI 32.11 kg/m    Physical Exam  Constitutional: She is oriented to person, place, and time. She appears well-developed and well-nourished. No distress.  HENT:  Head: Normocephalic and atraumatic.  Eyes: Conjunctivae are normal.  Cardiovascular: Normal rate.   Pulmonary/Chest: Effort normal.  Musculoskeletal: Normal range of motion.  Neurological: She is alert and oriented to person, place, and time. No cranial nerve deficit.  Skin: She is not diaphoretic.     Psychiatric: She has a normal mood and affect. Her behavior is normal. Judgment and thought content normal.  Nursing note and vitals reviewed.         Assessment & Plan:   Need for influenza vaccination - Plan: Flu Vaccine QUAD 36+ mos PF IM (Fluarix & Fluzone Quad PF)  Rash and nonspecific skin eruption No Follow-up on file.

## 2016-08-28 NOTE — Progress Notes (Signed)
Pre visit review using our clinic review tool, if applicable. No additional management support is needed unless otherwise documented below in the visit note. 

## 2016-08-28 NOTE — Assessment & Plan Note (Signed)
Initial appearance- ? Scabies but none between her hands or toes and no one else is itching.  No contact with children or recent travel. ? Allergic reaction. Given IM decadron in office, continue claritin.  Call PCP tomorrow with an update.

## 2016-08-28 NOTE — Addendum Note (Signed)
Addended by: Modena Nunnery on: 08/28/2016 11:39 AM   Modules accepted: Orders

## 2016-08-28 NOTE — Patient Instructions (Signed)
Good to see you. Please call us tomorrow with an update.

## 2016-08-29 ENCOUNTER — Telehealth: Payer: Self-pay

## 2016-08-29 NOTE — Telephone Encounter (Signed)
Pt left v/m;pt seen 08/28/16 by Dr Deborra Medina and pt received Decadron IM injection for itching; today itching is better; just occasional itch. Pt thinks shot helped. Pt was to cb with update for PCP; Dr Glori Bickers is out of office. FYI to Dr Diona Browner.

## 2016-08-29 NOTE — Telephone Encounter (Signed)
Noted. Forward to [employer].

## 2016-08-31 NOTE — Telephone Encounter (Signed)
Glad she is doing better- alert [place] if symptoms do not continue to improve

## 2016-09-08 ENCOUNTER — Ambulatory Visit (INDEPENDENT_AMBULATORY_CARE_PROVIDER_SITE_OTHER): Payer: Medicare Other | Admitting: Internal Medicine

## 2016-09-08 ENCOUNTER — Encounter (INDEPENDENT_AMBULATORY_CARE_PROVIDER_SITE_OTHER): Payer: Self-pay

## 2016-09-08 ENCOUNTER — Encounter: Payer: Self-pay | Admitting: Internal Medicine

## 2016-09-08 VITALS — BP 140/74 | HR 62 | Ht 67.0 in | Wt 204.0 lb

## 2016-09-08 DIAGNOSIS — R011 Cardiac murmur, unspecified: Secondary | ICD-10-CM

## 2016-09-08 NOTE — Patient Instructions (Signed)

## 2016-09-08 NOTE — Progress Notes (Signed)
Cardiology Office Note   Date:  09/08/2016   ID:  [name], [name] 1941-08-21, MRN [id number]  PCP:  Loura Pardon, MD  Cardiologist:   Dorris Carnes, MD   F/U of CAD     History of Present Illness: [name] is a 75 y.o. female with a history of CP, CAD gastritis  Scattered calcifications of great vessels seen on CT   Pt notes occsionl CP  Food can get stuck       Outpatient Medications Prior to Visit  Medication Sig Dispense Refill  . albuterol (PROVENTIL HFA;VENTOLIN HFA) 108 (90 Base) MCG/ACT inhaler Inhale 2 puffs into the lungs every 4 (four) hours as needed for wheezing or shortness of breath. Please give spacer if possible 1 Inhaler 1  . amLODipine (NORVASC) 5 MG tablet Take 1 tablet (5 mg total) by mouth daily. 90 tablet 2  . Calcium & Magnesium Carbonates (MYLANTA PO) Take 1 capsule by mouth as directed.      Marland Kitchen EPINEPHrine (EPIPEN IJ) Inject as directed as needed. Reported on 12/05/2015    . escitalopram (LEXAPRO) 20 MG tablet Take 1 tablet (20 mg total) by mouth daily. 30 tablet 11  . fluticasone (FLONASE) 50 MCG/ACT nasal spray PLACE 2 SPRAYS INTO THE NOSE DAILY. 16 g 5  . loratadine (CLARITIN) 10 MG tablet Take 10 mg by mouth daily.    . methotrexate (RHEUMATREX) 2.5 MG tablet Take 10 mg by mouth once a week. Caution:Chemotherapy. Protect from light.    Marland Kitchen NEXIUM 40 MG capsule TAKE 1 CAPSULE BY MOUTH TWICE DAILY 60 capsule 5  . ondansetron (ZOFRAN-ODT) 4 MG disintegrating tablet TAKE 1 TABLET BY MOUTH EVERY 8 HOURS AS NEEDED FOR NAUSEA/VOMIT 30 tablet 3  . polyethylene glycol (MIRALAX / GLYCOLAX) packet Take 17 g by mouth daily.    . ranitidine (ZANTAC) 150 MG capsule Take 1 capsule (150 mg total) by mouth 2 (two) times daily. 60 capsule 11   No facility-administered medications prior to visit.      Allergies:   Bee venom; Cetirizine hcl; and Hctz [hydrochlorothiazide]   Past Medical History:  Diagnosis Date  . Allergic rhinitis   . Anxiety   .  Cataract 2015   bilateral  . Diverticulosis   . Esophageal reflux   . Essential hypertension 01/07/2016  . Fibula fracture 02/2005  . Former smoker   . Gastritis   . Heart murmur   . Hemorrhoids   . History of nephrolithiasis   . Hyperlipemia   . Osteoarthritis   . Personal history of colonic polyps 1998   hyperplastic  . RA (rheumatoid arthritis) ([place])   . Shingles 2012    Past Surgical History:  Procedure Laterality Date  . CHOLECYSTECTOMY    . COLONOSCOPY  11/08   internal hemorrhoids  . EP procedure  1994   SVT; normal echo  . ESOPHAGOGASTRODUODENOSCOPY  11/03   reactive gastropathy  . LIPOMA EXCISION  11/2002  . SVT s/p surgery--? ablation    . UPPER GASTROINTESTINAL ENDOSCOPY    . vaginal polyp excised  12/2000   benign     Social History:  The patient  reports that she quit smoking about 12 years ago. She has never used smokeless tobacco. She reports that she does not drink alcohol or use drugs.   Family History:  The patient's family history includes Breast cancer in her sister; Colon cancer in her sister; Colon cancer (age of onset: 19) in her father; Diabetes in  her mother and other; Heart disease in her mother; Hypertension in her brother; Kidney disease in her mother; Stroke in her mother.    ROS:  Please see the history of present illness. All other systems are reviewed and  Negative to the above problem except as noted.    PHYSICAL EXAM: VS:  BP 140/74   Pulse 62   Ht 5\' 7"  (1.702 m)   Wt 204 lb (92.5 kg)   BMI 31.95 kg/m   GEN: Well nourished, well developed, in no acute distress HEENT: normal Neck: no JVD, carotid bruits, or masses Cardiac: RRR; Gr II/VI systolic murmur L SB  No rubs, or gallops,no edema  Respiratory:  clear to auscultation bilaterally, normal work of breathing GI: soft, nontender, nondistended, + BS  No hepatomegaly  MS: no deformity Moving all extremities   Skin: warm and dry, no rash Neuro:  Strength and sensation are  intact Psych: euthymic mood, full affect   EKG:  EKG is ordered today.  SR ;62 bpm  Occasional PAC     Lipid Panel    Component Value Date/Time   CHOL 165 12/14/2015 1218   TRIG 189 (H) 12/14/2015 1218   HDL 49 12/14/2015 1218   CHOLHDL 3.4 12/14/2015 1218   VLDL 38 (H) 12/14/2015 1218   LDLCALC 78 12/14/2015 1218      Wt Readings from Last 3 Encounters:  09/08/16 204 lb (92.5 kg)  08/28/16 205 lb (93 kg)  07/01/16 202 lb 2 oz (91.7 kg)      ASSESSMENT AND PLAN:  1  CP  PT denies signif spells    2  Murmur  Will get echo to eval  3  CAD  Mild plaquing  Keep on statin     Current medicines are reviewed at length with the patient today.  The patient does not have concerns regarding medicines.  Signed, Dorris Carnes, MD  09/08/2016 11:12 AM    [address], [place], [place]  [address] Phone: [phone]; Fax: [phone]

## 2016-09-16 ENCOUNTER — Encounter: Payer: Self-pay | Admitting: Family Medicine

## 2016-09-16 ENCOUNTER — Ambulatory Visit (INDEPENDENT_AMBULATORY_CARE_PROVIDER_SITE_OTHER): Payer: Medicare Other | Admitting: Family Medicine

## 2016-09-16 VITALS — BP 132/80 | HR 82 | Temp 97.7°F | Ht 67.0 in | Wt 205.5 lb

## 2016-09-16 DIAGNOSIS — B86 Scabies: Secondary | ICD-10-CM | POA: Diagnosis not present

## 2016-09-16 MED ORDER — PERMETHRIN 5 % EX CREA: 1.0000 "application " | TOPICAL_CREAM | Freq: Once | CUTANEOUS | 1 refills | Status: AC

## 2016-09-16 NOTE — Patient Instructions (Addendum)
Instead of claritin try zyrtec 10 mg over the counter- it may work better for itching  Use the permethrin cream as directed (repeat course in 1 week if not better)  I think you have scabies Wash linens in hot water and clean surfaces well  Stay cool because being hot makes you itch more

## 2016-09-16 NOTE — Assessment & Plan Note (Signed)
After exp to fam member who stayed with her  Px permethrin cream - for use as directed (rep course in a week if not better)  inst given  Wash linens in hot water  Stay cool  Zyrtec prn itch  Update if not starting to improve in a week or if worsening

## 2016-09-16 NOTE — Progress Notes (Signed)
Subjective:    Patient ID: [name], female    DOB: December 12, 1940, 75 y.o.   MRN: [id number]  HPI Here for an itchy rash  Seen by Dr Deborra Medina 11/9 for this- itchy bumps on abd/waist line and back  tx with decadron for possible dermatitis Improved for about a week  Then came back   Very very itchy Tried tea tree oil Cannot take benadryl  No known exp to scabies  Her grandson is in college- he was itching when he came home for the holiday He was dx with scabies    bp is elevated BP Readings from Last 3 Encounters:  09/16/16 (!) 154/68  09/08/16 140/74  08/28/16 (!) 146/82   better on 2nd check after sitting BP: 132/80   Patient Active Problem List   Diagnosis Date Noted  . Scabies 09/16/2016  . Rash and nonspecific skin eruption 08/28/2016  . Adjustment disorder with mixed anxiety and depressed mood 06/24/2016  . IBS (irritable bowel syndrome) 06/09/2016  . Cough 06/09/2016  . Abdominal pain, left lower quadrant 02/15/2016  . Abdominal pain, chronic, epigastric 02/15/2016  . Hyperglycemia 02/06/2016  . Essential hypertension 01/07/2016  . Gastritis 11/07/2015  . Fatty liver 11/07/2015  . Coronary atherosclerosis 11/07/2015  . Left shoulder pain 12/25/2014  . Left-sided chest wall pain 12/25/2014  . Fatigue 04/24/2014  . Obesity 04/24/2014  . Encounter for Medicare annual wellness exam 04/24/2014  . Post herpetic neuralgia 07/09/2011  . Low back pain 07/09/2011  . Left sided abdominal pain 06/12/2011  . Rheumatoid arthritis ([place]) 07/23/2007  . GERD 07/09/2007  . HYPERCHOLESTEROLEMIA 03/19/2007  . PSVT 03/19/2007  . ALLERGIC RHINITIS 03/19/2007  . ENDOMETRIAL POLYP 03/19/2007  . POSTMENOPAUSAL STATUS 03/19/2007  . OSTEOARTHRITIS 03/19/2007  . SPINAL STENOSIS 03/19/2007  . MURMUR 03/19/2007  . NEPHROLITHIASIS, HX OF 03/19/2007   Past Medical History:  Diagnosis Date  . Allergic rhinitis   . Anxiety   . Cataract 2015   bilateral  . Diverticulosis   .  Esophageal reflux   . Essential hypertension 01/07/2016  . Fibula fracture 02/2005  . Former smoker   . Gastritis   . Heart murmur   . Hemorrhoids   . History of nephrolithiasis   . Hyperlipemia   . Osteoarthritis   . Personal history of colonic polyps 1998   hyperplastic  . RA (rheumatoid arthritis) ([place])   . Shingles 2012   Past Surgical History:  Procedure Laterality Date  . CHOLECYSTECTOMY    . COLONOSCOPY  11/08   internal hemorrhoids  . EP procedure  1994   SVT; normal echo  . ESOPHAGOGASTRODUODENOSCOPY  11/03   reactive gastropathy  . LIPOMA EXCISION  11/2002  . SVT s/p surgery--? ablation    . UPPER GASTROINTESTINAL ENDOSCOPY    . vaginal polyp excised  12/2000   benign   Social History  Substance Use Topics  . Smoking status: Former Smoker    Quit date: 10/21/2003  . Smokeless tobacco: Never Used  . Alcohol use No   Family History  Problem Relation Age of Onset  . Colon cancer Father 72  . Diabetes Mother   . Heart disease Mother   . Kidney disease Mother     ESRD  . Stroke Mother   . Hypertension Brother   . Breast cancer Sister   . Diabetes Other     nephews  . Colon cancer Sister   . Esophageal cancer Neg Hx   . Stomach cancer Neg Hx   .  Rectal cancer Neg Hx    Allergies  Allergen Reactions  . Bee Venom Anaphylaxis  . Cetirizine Hcl     REACTION: does not work  . Hctz [Hydrochlorothiazide] Other (See Comments)    Felt weak all over  Felt weak all over    Current Outpatient Prescriptions on File Prior to Visit  Medication Sig Dispense Refill  . albuterol (PROVENTIL HFA;VENTOLIN HFA) 108 (90 Base) MCG/ACT inhaler Inhale 2 puffs into the lungs every 4 (four) hours as needed for wheezing or shortness of breath. Please give spacer if possible 1 Inhaler 1  . amLODipine (NORVASC) 5 MG tablet Take 1 tablet (5 mg total) by mouth daily. 90 tablet 2  . Calcium & Magnesium Carbonates (MYLANTA PO) Take 1 capsule by mouth as directed.      Marland Kitchen EPINEPHrine  (EPIPEN IJ) Inject as directed as needed. Reported on 12/05/2015    . escitalopram (LEXAPRO) 20 MG tablet Take 1 tablet (20 mg total) by mouth daily. 30 tablet 11  . fluticasone (FLONASE) 50 MCG/ACT nasal spray PLACE 2 SPRAYS INTO THE NOSE DAILY. 16 g 5  . loratadine (CLARITIN) 10 MG tablet Take 10 mg by mouth daily.    . methotrexate (RHEUMATREX) 2.5 MG tablet Take 10 mg by mouth once a week. Caution:Chemotherapy. Protect from light.    Marland Kitchen NEXIUM 40 MG capsule TAKE 1 CAPSULE BY MOUTH TWICE DAILY 60 capsule 5  . ondansetron (ZOFRAN-ODT) 4 MG disintegrating tablet TAKE 1 TABLET BY MOUTH EVERY 8 HOURS AS NEEDED FOR NAUSEA/VOMIT 30 tablet 3  . polyethylene glycol (MIRALAX / GLYCOLAX) packet Take 17 g by mouth daily.    . ranitidine (ZANTAC) 150 MG capsule Take 1 capsule (150 mg total) by mouth 2 (two) times daily. 60 capsule 11   No current facility-administered medications on file prior to visit.     Review of Systems Review of Systems  Constitutional: Negative for fever, appetite change, fatigue and unexpected weight change.  Eyes: Negative for pain and visual disturbance.  Respiratory: Negative for cough and shortness of breath.   Cardiovascular: Negative for cp or palpitations    Gastrointestinal: Negative for nausea, diarrhea and constipation.  Genitourinary: Negative for urgency and frequency.  Skin: Negative for pallor and pos for itching and rash Neurological: Negative for weakness, light-headedness, numbness and headaches.  Hematological: Negative for adenopathy. Does not bruise/bleed easily.  Psychiatric/Behavioral: Negative for dysphoric mood. The patient is not nervous/anxious.         Objective:   Physical Exam  Constitutional: She appears well-developed and well-nourished. No distress.  HENT:  Head: Normocephalic and atraumatic.  Mouth/Throat: Oropharynx is clear and moist.  Eyes: Conjunctivae and EOM are normal. Pupils are equal, round, and reactive to light.  Neck: Normal  range of motion. Neck supple.  Cardiovascular: Normal rate and regular rhythm.   Pulmonary/Chest: She has no wheezes.  Lymphadenopathy:    She has no cervical adenopathy.  Neurological: She is alert. No cranial nerve deficit.  Skin: Skin is warm and dry. Rash noted.  Papular rash with a few vesicles and excoriations around waist and chest and back/ few on distal arms (sparing hands and feet)  No drainage and no hives   Psychiatric: She has a normal mood and affect.          Assessment & Plan:   Problem List Items Addressed This Visit      Musculoskeletal and Integument   Scabies    After exp to fam member who stayed  with her  Px permethrin cream - for use as directed (rep course in a week if not better)  inst given  Wash linens in hot water  Stay cool  Zyrtec prn itch  Update if not starting to improve in a week or if worsening

## 2016-09-16 NOTE — Progress Notes (Signed)
Pre visit review using our clinic review tool, if applicable. No additional management support is needed unless otherwise documented below in the visit note. 

## 2016-09-19 ENCOUNTER — Other Ambulatory Visit: Payer: Self-pay | Admitting: *Deleted

## 2016-09-19 MED ORDER — FLUTICASONE PROPIONATE 50 MCG/ACT NA SUSP: NASAL | 0 refills | Status: DC

## 2016-09-23 ENCOUNTER — Ambulatory Visit: Payer: Medicare Other | Admitting: Family Medicine

## 2016-10-06 ENCOUNTER — Ambulatory Visit (HOSPITAL_COMMUNITY): Payer: Medicare Other | Attending: Internal Medicine

## 2016-10-06 ENCOUNTER — Other Ambulatory Visit: Payer: Self-pay

## 2016-10-06 DIAGNOSIS — I2510 Atherosclerotic heart disease of native coronary artery without angina pectoris: Secondary | ICD-10-CM | POA: Insufficient documentation

## 2016-10-06 DIAGNOSIS — Z6832 Body mass index (BMI) 32.0-32.9, adult: Secondary | ICD-10-CM | POA: Diagnosis not present

## 2016-10-06 DIAGNOSIS — R011 Cardiac murmur, unspecified: Secondary | ICD-10-CM | POA: Insufficient documentation

## 2016-10-06 DIAGNOSIS — I071 Rheumatic tricuspid insufficiency: Secondary | ICD-10-CM | POA: Diagnosis not present

## 2016-10-06 DIAGNOSIS — I340 Nonrheumatic mitral (valve) insufficiency: Secondary | ICD-10-CM | POA: Insufficient documentation

## 2016-10-07 ENCOUNTER — Ambulatory Visit (INDEPENDENT_AMBULATORY_CARE_PROVIDER_SITE_OTHER): Payer: Medicare Other | Admitting: Family Medicine

## 2016-10-07 ENCOUNTER — Encounter: Payer: Self-pay | Admitting: Family Medicine

## 2016-10-07 VITALS — BP 128/70 | HR 85 | Temp 97.9°F | Ht 66.25 in | Wt 202.8 lb

## 2016-10-07 DIAGNOSIS — E2839 Other primary ovarian failure: Secondary | ICD-10-CM

## 2016-10-07 DIAGNOSIS — Z6832 Body mass index (BMI) 32.0-32.9, adult: Secondary | ICD-10-CM

## 2016-10-07 DIAGNOSIS — R739 Hyperglycemia, unspecified: Secondary | ICD-10-CM

## 2016-10-07 DIAGNOSIS — I10 Essential (primary) hypertension: Secondary | ICD-10-CM | POA: Diagnosis not present

## 2016-10-07 DIAGNOSIS — M0579 Rheumatoid arthritis with rheumatoid factor of multiple sites without organ or systems involvement: Secondary | ICD-10-CM

## 2016-10-07 DIAGNOSIS — E6609 Other obesity due to excess calories: Secondary | ICD-10-CM

## 2016-10-07 DIAGNOSIS — E7800 Pure hypercholesterolemia, unspecified: Secondary | ICD-10-CM

## 2016-10-07 DIAGNOSIS — K2950 Unspecified chronic gastritis without bleeding: Secondary | ICD-10-CM

## 2016-10-07 DIAGNOSIS — Z0000 Encounter for general adult medical examination without abnormal findings: Secondary | ICD-10-CM | POA: Diagnosis not present

## 2016-10-07 LAB — CBC WITH DIFFERENTIAL/PLATELET
Basophils Absolute: 0 10*3/uL (ref 0.0–0.1)
Basophils Relative: 0.4 % (ref 0.0–3.0)
EOS PCT: 1.5 % (ref 0.0–5.0)
Eosinophils Absolute: 0.1 10*3/uL (ref 0.0–0.7)
HCT: 42.3 % (ref 36.0–46.0)
Hemoglobin: 14.3 g/dL (ref 12.0–15.0)
LYMPHS ABS: 2.5 10*3/uL (ref 0.7–4.0)
Lymphocytes Relative: 31.4 % (ref 12.0–46.0)
MCHC: 33.7 g/dL (ref 30.0–36.0)
MCV: 86.9 fl (ref 78.0–100.0)
MONOS PCT: 8 % (ref 3.0–12.0)
Monocytes Absolute: 0.6 10*3/uL (ref 0.1–1.0)
NEUTROS ABS: 4.7 10*3/uL (ref 1.4–7.7)
NEUTROS PCT: 58.7 % (ref 43.0–77.0)
PLATELETS: 274 10*3/uL (ref 150.0–400.0)
RBC: 4.86 Mil/uL (ref 3.87–5.11)
RDW: 15.5 % (ref 11.5–15.5)
WBC: 8 10*3/uL (ref 4.0–10.5)

## 2016-10-07 LAB — LIPID PANEL
CHOL/HDL RATIO: 4
Cholesterol: 160 mg/dL (ref 0–200)
HDL: 44.6 mg/dL (ref >39.00)
LDL Cholesterol: 78 mg/dL (ref 0–99)
NonHDL: 115.38
TRIGLYCERIDES: 188 mg/dL — AB (ref 0.0–149.0)
VLDL: 37.6 mg/dL (ref 0.0–40.0)

## 2016-10-07 LAB — HEMOGLOBIN A1C: HEMOGLOBIN A1C: 6.3 % (ref 4.6–6.5)

## 2016-10-07 LAB — COMPREHENSIVE METABOLIC PANEL
ALK PHOS: 62 U/L (ref 39–117)
ALT: 11 U/L (ref 0–35)
AST: 19 U/L (ref 0–37)
Albumin: 4.5 g/dL (ref 3.5–5.2)
BUN: 8 mg/dL (ref 6–23)
CHLORIDE: 101 meq/L (ref 96–112)
CO2: 29 mEq/L (ref 19–32)
Calcium: 9.4 mg/dL (ref 8.4–10.5)
Creatinine, Ser: 0.84 mg/dL (ref 0.40–1.20)
GFR: 85.14 mL/min (ref >60.00)
GLUCOSE: 79 mg/dL (ref 70–99)
POTASSIUM: 3.9 meq/L (ref 3.5–5.1)
Sodium: 137 mEq/L (ref 135–145)
TOTAL PROTEIN: 7.8 g/dL (ref 6.0–8.3)
Total Bilirubin: 0.4 mg/dL (ref 0.2–1.2)

## 2016-10-07 LAB — TSH: TSH: 1.08 u[IU]/mL (ref 0.35–4.50)

## 2016-10-07 MED ORDER — AMLODIPINE BESYLATE 5 MG PO TABS: 5.0000 mg | ORAL_TABLET | Freq: Every day | ORAL | 3 refills | Status: DC

## 2016-10-07 MED ORDER — FLUTICASONE PROPIONATE 50 MCG/ACT NA SUSP: NASAL | 3 refills | Status: DC

## 2016-10-07 NOTE — Assessment & Plan Note (Signed)
Improved with H2 blocker

## 2016-10-07 NOTE — Assessment & Plan Note (Signed)
Discussed how this problem influences overall health and the risks it imposes  Reviewed plan for weight loss with lower calorie diet (via better food choices and also portion control or program like weight watchers) and exercise building up to or more than 30 minutes 5 days per week including some aerobic activity   She may benefit from water exercise

## 2016-10-07 NOTE — Assessment & Plan Note (Signed)
Pt has not been taking MTX regularly  Enc f/u with her rheumatologist

## 2016-10-07 NOTE — Assessment & Plan Note (Signed)
Reviewed health habits including diet and exercise and skin cancer prevention Reviewed appropriate screening tests for age  Also reviewed health mt list, fam hx and immunization status , as well as social and family history   Rev AMW Given info re: getting tetanus shot in pharmacy Cannot have zoster vaccine due to her imm med from rheumatology  Hearing loss does not bother her  Mammogram utd  dexa ordered  inst to start ca and D as well as exercise as tolerated Labs today

## 2016-10-07 NOTE — Patient Instructions (Addendum)
Low impact exercise (like water exercise)- would be helpful  You are due for a tetanus shot - however medicare usually does not cover it in the office-see the handout about getting it in a drug store   Eat a balanced diet  Also get exercise - for energy level   Try to get 1200-1500 mg of calcium per day with at least 1000 iu of vitamin D - for bone health   Labs today   Stop at check out for referral for a bone density test

## 2016-10-07 NOTE — Progress Notes (Signed)
Pre visit review using our clinic review tool, if applicable. No additional management support is needed unless otherwise documented below in the visit note. 

## 2016-10-07 NOTE — Assessment & Plan Note (Signed)
Lab Results  Component Value Date   HGBA1C 6.4 03/24/2016   Due for one today Disc low refined carb diet- given copy of DASH eating plan  Disc need for wt loss

## 2016-10-07 NOTE — Assessment & Plan Note (Signed)
Disc goals for lipids and reasons to control them Rev labs with pt (last check)  Lipid profile today  Rev low sat fat diet in detail

## 2016-10-07 NOTE — Assessment & Plan Note (Signed)
bp in fair control at this time  BP Readings from Last 1 Encounters:  10/07/16 128/70   No changes needed Disc lifstyle change with low sodium diet and exercise  Labs today Wt loss enc

## 2016-10-07 NOTE — Assessment & Plan Note (Signed)
Ref for dexa 

## 2016-10-07 NOTE — Progress Notes (Signed)
Subjective:    Patient ID: [name], female    DOB: April 19, 1941, 75 y.o.   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 - she has not noticed it and not bothersome to her Does not want hearing aides  Mini cog 26 She notes from time to time she will forget why she walks in a room  She does socialize and talk on the phone a lot   Tetanus shot 11/07-given handout regarding getting it in a drug store   Zoster vaccine -cannot get due to MTX   Mammogram 5/17-normal  Self breast exam -no lumps or changes  Sister had breast cancer    dexa 11/07 No falls  Broke ankle in the past  Does not take ca and D    Colonoscopy 6/17-hyperplastic polyps Father and sister had colon cancer -unsure if she has recall yet   bp is stable today (per pt it goes up and down)  No cp or palpitations or headaches or edema  No side effects to medicines  BP Readings from Last 3 Encounters:  10/07/16 (!) 146/82  09/16/16 132/80  09/08/16 140/74     Hx of hyperlipidemia  Lab Results  Component Value Date   CHOL 165 12/14/2015   CHOL 182 04/26/2014   Lab Results  Component Value Date   HDL 49 12/14/2015   HDL 49.00 04/26/2014   Lab Results  Component Value Date   LDLCALC 78 12/14/2015   LDLCALC 90 04/26/2014   Lab Results  Component Value Date   TRIG 189 (H) 12/14/2015   TRIG 215.0 (H) 04/26/2014   Lab Results  Component Value Date   CHOLHDL 3.4 12/14/2015   CHOLHDL 4 04/26/2014   No results found for: LDLDIRECT Has not eaten today - due for labs   Hx of hyperglycemia Lab Results  Component Value Date   HGBA1C 6.4 03/24/2016    due for a recheck  Review of Systems    Review of Systems  Constitutional: Negative for fever, appetite change,  and unexpected weight change. pos for sluggishness  Eyes: Negative for pain and visual disturbance.  Respiratory: Negative for cough and shortness of breath.   Cardiovascular: Negative for cp or palpitations    Gastrointestinal: Negative for nausea, diarrhea and constipation.  Genitourinary: Negative for urgency and frequency.  Skin: Negative for pallor or rash   MSK pos for chronic joint and back pain  Neurological: Negative for weakness, light-headedness, numbness and headaches.  Hematological: Negative for adenopathy. Does not bruise/bleed easily.  Psychiatric/Behavioral: Negative for dysphoric mood. The patient is not nervous/anxious.      Objective:   Physical Exam  Constitutional: She appears well-developed and well-nourished. No distress.  obese and well appearing   HENT:  Head: Normocephalic and atraumatic.  Right Ear: External ear normal.  Left Ear: External ear normal.  Mouth/Throat: Oropharynx is clear and moist.  Eyes: Conjunctivae and EOM are normal. Pupils are equal, round, and reactive to light. No scleral icterus.  Neck: Normal range of motion. Neck supple. No JVD  present. Carotid bruit is not present. No thyromegaly present.  Cardiovascular: Normal rate, regular rhythm, normal heart sounds and intact distal pulses.  Exam reveals no gallop.   Pulmonary/Chest: Effort normal and breath sounds normal. No respiratory distress. She has no wheezes. She exhibits no tenderness.  Abdominal: Soft. Bowel sounds are normal. She exhibits no distension, no abdominal bruit and no mass. There is no tenderness.  Genitourinary: No breast swelling, tenderness, discharge or bleeding.  Genitourinary Comments: Breast exam: No mass, nodules, thickening, tenderness, bulging, retraction, inflamation, nipple discharge or skin changes noted.  No axillary or clavicular LA.       Musculoskeletal: Normal range of motion. She exhibits tenderness. She exhibits no edema.  Some tender joints and tender LS  Changes notable for RA  Lymphadenopathy:    She has no cervical adenopathy.  Neurological: She is alert. She has normal reflexes. No cranial nerve deficit. She exhibits normal muscle tone. Coordination normal.  Skin: Skin is warm and dry. No rash noted. No erythema. No pallor.  Some skin tags  Psychiatric: She has a normal mood and affect.  Mood is good today          Assessment & Plan:   Problem List Items Addressed This Visit      Cardiovascular and Mediastinum   Essential hypertension - Primary    bp in fair control at this time  BP Readings from Last 1 Encounters:  10/07/16 128/70   No changes needed Disc lifstyle change with low sodium diet and exercise  Labs today Wt loss enc       Relevant Medications   amLODipine (NORVASC) 5 MG tablet     Digestive   Gastritis    Improved with H2 blocker        Musculoskeletal and Integument   Rheumatoid arthritis (HCC)    Pt has not been taking MTX regularly  Enc f/u with her rheumatologist         Other   Routine general medical examination at a health care facility    Reviewed health habits including diet and exercise and skin cancer prevention Reviewed appropriate screening tests for age  Also reviewed health mt list, fam hx and immunization status , as well as social and family history   Rev AMW Given info re: getting tetanus shot in pharmacy Cannot have zoster vaccine due to her imm med from rheumatology  Hearing loss does not bother her  Mammogram utd  dexa ordered  inst to start ca and D as well as exercise as tolerated Labs today      Relevant Orders   CBC with Differential/Platelet (Completed)   TSH (Completed)   Obesity    Discussed how this problem influences overall health and the risks it imposes  Reviewed plan for weight loss with lower calorie diet (via better food  choices and also portion control or program like weight watchers) and exercise building up to or more than 30 minutes 5 days per week including some aerobic activity   She may benefit from water exercise       Hyperglycemia    Lab Results  Component Value Date   HGBA1C 6.4 03/24/2016   Due for one today Disc low refined carb diet- given copy of DASH eating plan  Disc need for wt loss      HYPERCHOLESTEROLEMIA    Disc goals for lipids and reasons to control them Rev labs with pt (last check)  Lipid profile today  Rev low sat  fat diet in detail       Relevant Medications   amLODipine (NORVASC) 5 MG tablet   Estrogen deficiency    Ref for dexa       Relevant Orders   DG Bone Density

## 2016-10-08 ENCOUNTER — Encounter: Payer: Self-pay | Admitting: *Deleted

## 2016-10-16 ENCOUNTER — Other Ambulatory Visit: Payer: Self-pay | Admitting: *Deleted

## 2016-10-16 ENCOUNTER — Encounter: Payer: Self-pay | Admitting: *Deleted

## 2016-10-16 ENCOUNTER — Ambulatory Visit
Admission: RE | Admit: 2016-10-16 | Discharge: 2016-10-16 | Disposition: A | Discharge status: Home / Self Care | Payer: Medicare Other | Source: Ambulatory Visit | Attending: Family Medicine | Admitting: Family Medicine

## 2016-10-16 DIAGNOSIS — E2839 Other primary ovarian failure: Secondary | ICD-10-CM

## 2016-10-16 DIAGNOSIS — Z780 Asymptomatic menopausal state: Secondary | ICD-10-CM | POA: Diagnosis not present

## 2016-10-16 DIAGNOSIS — Z13820 Encounter for screening for osteoporosis: Secondary | ICD-10-CM | POA: Diagnosis not present

## 2016-10-16 LAB — HM DEXA SCAN: HM Dexa Scan: NORMAL

## 2016-10-16 MED ORDER — RANITIDINE HCL 150 MG PO CAPS: 150.0000 mg | ORAL_CAPSULE | Freq: Two times a day (BID) | ORAL | 3 refills | Status: DC

## 2016-10-16 NOTE — Telephone Encounter (Signed)
Received fax requesting pt's Rx to be changed to a 90 day supply, done

## 2016-10-20 DIAGNOSIS — K625 Hemorrhage of anus and rectum: Secondary | ICD-10-CM

## 2016-10-20 DIAGNOSIS — K760 Fatty (change of) liver, not elsewhere classified: Secondary | ICD-10-CM

## 2016-10-20 HISTORY — DX: Fatty (change of) liver, not elsewhere classified: K76.0

## 2016-10-20 HISTORY — DX: Hemorrhage of anus and rectum: K62.5

## 2016-10-28 ENCOUNTER — Ambulatory Visit (INDEPENDENT_AMBULATORY_CARE_PROVIDER_SITE_OTHER): Payer: Medicare Other | Admitting: Family Medicine

## 2016-10-28 ENCOUNTER — Encounter: Payer: Self-pay | Admitting: Family Medicine

## 2016-10-28 VITALS — BP 134/68 | HR 68 | Temp 98.2°F | Ht 66.25 in | Wt 203.5 lb

## 2016-10-28 DIAGNOSIS — J0100 Acute maxillary sinusitis, unspecified: Secondary | ICD-10-CM | POA: Diagnosis not present

## 2016-10-28 DIAGNOSIS — M0579 Rheumatoid arthritis with rheumatoid factor of multiple sites without organ or systems involvement: Secondary | ICD-10-CM | POA: Diagnosis not present

## 2016-10-28 DIAGNOSIS — I471 Supraventricular tachycardia: Secondary | ICD-10-CM | POA: Diagnosis not present

## 2016-10-28 MED ORDER — AMOXICILLIN-POT CLAVULANATE 875-125 MG PO TABS: 1.0000 | ORAL_TABLET | Freq: Two times a day (BID) | ORAL | 0 refills | Status: DC

## 2016-10-28 MED ORDER — GUAIFENESIN-CODEINE 100-10 MG/5ML PO SYRP: 5.0000 mL | ORAL_SOLUTION | Freq: Four times a day (QID) | ORAL | 0 refills | Status: DC | PRN

## 2016-10-28 NOTE — Assessment & Plan Note (Signed)
No re occurances 

## 2016-10-28 NOTE — Assessment & Plan Note (Signed)
With a cold in pt on high risk medications for RA Cover with augmentin Re assuring exam Rob- AC for cough prn with caution of sedation  Fluids/rest Disc symptomatic care - see instructions on AVS  Update if not starting to improve in a week or if worsening

## 2016-10-28 NOTE — Progress Notes (Signed)
Subjective:    Patient ID: [name], female    DOB: [birth year], 76 y.o.   MRN: [id number]  HPI Here for uri symptoms   She started last week with uri symptoms   This am - purulent nasal drainage with blood Also prod cough-yellow phlegm   Coughing all night Makes her chest sore and tight  Takes a lot out of her  A little wheezing -not much  ST originally with hoarse voice That is improved   Some facial pain -on and off / pressure   No fever  Temp: 98.2 F (36.8 C)  Did feel hot and cold for a while  ? Body aches- hard to tell with her RA  RA acts up in this weather - the cold makes her worse On methotrexate    Patient Active Problem List   Diagnosis Date Noted  . Estrogen deficiency 10/07/2016  . Routine general medical examination at a health care facility 10/07/2016  . Adjustment disorder with mixed anxiety and depressed mood 06/24/2016  . IBS (irritable bowel syndrome) 06/09/2016  . Hyperglycemia 02/06/2016  . Essential hypertension 01/07/2016  . Gastritis 11/07/2015  . Fatty liver 11/07/2015  . Coronary atherosclerosis 11/07/2015  . Left shoulder pain 12/25/2014  . Left-sided chest wall pain 12/25/2014  . Fatigue 04/24/2014  . Obesity 04/24/2014  . Encounter for Medicare annual wellness exam 04/24/2014  . Post herpetic neuralgia 07/09/2011  . Low back pain 07/09/2011  . Left sided abdominal pain 06/12/2011  . Rheumatoid arthritis ([place]) 07/23/2007  . GERD 07/09/2007  . HYPERCHOLESTEROLEMIA 03/19/2007  . PSVT 03/19/2007  . ALLERGIC RHINITIS 03/19/2007  . ENDOMETRIAL POLYP 03/19/2007  . POSTMENOPAUSAL STATUS 03/19/2007  . OSTEOARTHRITIS 03/19/2007  . SPINAL STENOSIS 03/19/2007  . MURMUR 03/19/2007  . NEPHROLITHIASIS, HX OF 03/19/2007   Past Medical History:  Diagnosis Date  . Allergic rhinitis   . Anxiety   . Cataract 2015   bilateral  . Diverticulosis   . Esophageal reflux   . Essential hypertension 01/07/2016  . Fibula fracture 02/2005   . Former smoker   . Gastritis   . Heart murmur   . Hemorrhoids   . History of nephrolithiasis   . Hyperlipemia   . Osteoarthritis   . Personal history of colonic polyps 1998   hyperplastic  . RA (rheumatoid arthritis) ([place])   . Shingles 2012   Past Surgical History:  Procedure Laterality Date  . CHOLECYSTECTOMY    . COLONOSCOPY  11/08   internal hemorrhoids  . EP procedure  1994   SVT; normal echo  . ESOPHAGOGASTRODUODENOSCOPY  11/03   reactive gastropathy  . LIPOMA EXCISION  11/2002  . SVT s/p surgery--? ablation    . UPPER GASTROINTESTINAL ENDOSCOPY    . vaginal polyp excised  12/2000   benign   Social History  Substance Use Topics  . Smoking status: Former Smoker    Quit date: 10/21/2003  . Smokeless tobacco: Never Used  . Alcohol use No   Family History  Problem Relation Age of Onset  . Colon cancer Father 41  . Diabetes Mother   . Heart disease Mother   . Kidney disease Mother     ESRD  . Stroke Mother   . Hypertension Brother   . Breast cancer Sister   . Diabetes Other     nephews  . Colon cancer Sister   . Esophageal cancer Neg Hx   . Stomach cancer Neg Hx   . Rectal cancer Neg Hx  Allergies  Allergen Reactions  . Bee Venom Anaphylaxis  . Cetirizine Hcl     REACTION: does not work  . Hctz [Hydrochlorothiazide] Other (See Comments)    Felt weak all over  Felt weak all over    Current Outpatient Prescriptions on File Prior to Visit  Medication Sig Dispense Refill  . albuterol (PROVENTIL HFA;VENTOLIN HFA) 108 (90 Base) MCG/ACT inhaler Inhale 2 puffs into the lungs every 4 (four) hours as needed for wheezing or shortness of breath. Please give spacer if possible 1 Inhaler 1  . amLODipine (NORVASC) 5 MG tablet Take 1 tablet (5 mg total) by mouth daily. 90 tablet 3  . Calcium & Magnesium Carbonates (MYLANTA PO) Take 1 capsule by mouth as directed.      Marland Kitchen EPINEPHrine (EPIPEN IJ) Inject as directed as needed. Reported on 12/05/2015    . escitalopram  (LEXAPRO) 20 MG tablet Take 1 tablet (20 mg total) by mouth daily. 30 tablet 11  . fluticasone (FLONASE) 50 MCG/ACT nasal spray PLACE 2 SPRAYS INTO THE NOSE DAILY. 48 g 3  . loratadine (CLARITIN) 10 MG tablet Take 10 mg by mouth daily.    . methotrexate (RHEUMATREX) 2.5 MG tablet Take 10 mg by mouth once a week. Caution:Chemotherapy. Protect from light.    Marland Kitchen NEXIUM 40 MG capsule TAKE 1 CAPSULE BY MOUTH TWICE DAILY 60 capsule 5  . ondansetron (ZOFRAN-ODT) 4 MG disintegrating tablet TAKE 1 TABLET BY MOUTH EVERY 8 HOURS AS NEEDED FOR NAUSEA/VOMIT 30 tablet 3  . polyethylene glycol (MIRALAX / GLYCOLAX) packet Take 17 g by mouth daily.    . ranitidine (ZANTAC) 150 MG capsule Take 1 capsule (150 mg total) by mouth 2 (two) times daily. 180 capsule 3   No current facility-administered medications on file prior to visit.      Review of Systems  Constitutional: Positive for appetite change. Negative for fatigue and fever.  HENT: Positive for congestion, ear pain, postnasal drip, rhinorrhea, sinus pressure and sore throat. Negative for nosebleeds.   Eyes: Negative for pain, redness and itching.  Respiratory: Positive for cough. Negative for shortness of breath and wheezing.   Cardiovascular: Negative for chest pain.  Gastrointestinal: Negative for abdominal pain, diarrhea, nausea and vomiting.  Endocrine: Negative for polyuria.  Genitourinary: Negative for dysuria, frequency and urgency.  Musculoskeletal: Negative for arthralgias and myalgias.  Allergic/Immunologic: Negative for immunocompromised state.  Neurological: Positive for headaches. Negative for dizziness, tremors, syncope, weakness and numbness.  Hematological: Negative for adenopathy. Does not bruise/bleed easily.  Psychiatric/Behavioral: Negative for dysphoric mood. The patient is not nervous/anxious.        Objective:   Physical Exam  Constitutional: She appears well-developed and well-nourished. No distress.  Well appearing     HENT:  Head: Normocephalic and atraumatic.  Right Ear: External ear normal.  Left Ear: External ear normal.  Mouth/Throat: Oropharynx is clear and moist. No oropharyngeal exudate.  Nares are injected and congested  Bilateral maxillary sinus tenderness  Post nasal drip   Eyes: Conjunctivae and EOM are normal. Pupils are equal, round, and reactive to light. Right eye exhibits no discharge. Left eye exhibits no discharge.  Neck: Normal range of motion. Neck supple.  Cardiovascular: Normal rate and regular rhythm.   Pulmonary/Chest: Effort normal and breath sounds normal. No respiratory distress. She has no wheezes. She has no rales.  Harsh bs with good air exch No rales or rhonchi  Musculoskeletal:  Baseline changes from RA  Lymphadenopathy:    She has no  cervical adenopathy.  Neurological: She is alert. No cranial nerve deficit.  Skin: Skin is warm and dry. No rash noted.  Psychiatric: She has a normal mood and affect.          Assessment & Plan:   Problem List Items Addressed This Visit      Cardiovascular and Mediastinum   PSVT    No re occurances         Respiratory   Acute sinusitis    With a cold in pt on high risk medications for RA Cover with augmentin Re assuring exam Rob- AC for cough prn with caution of sedation  Fluids/rest Disc symptomatic care - see instructions on AVS  Update if not starting to improve in a week or if worsening        Relevant Medications   amoxicillin-clavulanate (AUGMENTIN) 875-125 MG tablet   guaiFENesin-codeine (ROBITUSSIN AC) 100-10 MG/5ML syrup     Musculoskeletal and Integument   Rheumatoid arthritis (HCC) - Primary    High risk medications  Will tx sinusitis with abx  She will ask her rheumatologist whether to hold the mtx when she is sick

## 2016-10-28 NOTE — Progress Notes (Signed)
Pre visit review using our clinic review tool, if applicable. No additional management support is needed unless otherwise documented below in the visit note. 

## 2016-10-28 NOTE — Patient Instructions (Signed)
I think you have a cold with a sinus infection  Drink fluids and rest  Continue delsym for daytime / or use the px cough medicine with codeine for night time or when not working or driving Take augmentin for the sinus infection  Breathe steam for congestion   Update if not starting to improve in a week or if worsening

## 2016-10-28 NOTE — Assessment & Plan Note (Signed)
High risk medications  Will tx sinusitis with abx  She will ask her rheumatologist whether to hold the mtx when she is sick

## 2016-11-17 ENCOUNTER — Telehealth (INDEPENDENT_AMBULATORY_CARE_PROVIDER_SITE_OTHER): Payer: Self-pay | Admitting: Physical Medicine and Rehabilitation

## 2016-11-17 NOTE — Telephone Encounter (Signed)
Scheduled for an office visit 11/18/16 at 930.

## 2016-11-17 NOTE — Telephone Encounter (Signed)
ov

## 2016-11-18 ENCOUNTER — Encounter (INDEPENDENT_AMBULATORY_CARE_PROVIDER_SITE_OTHER): Payer: Self-pay | Admitting: Physical Medicine and Rehabilitation

## 2016-11-18 ENCOUNTER — Ambulatory Visit (INDEPENDENT_AMBULATORY_CARE_PROVIDER_SITE_OTHER): Payer: Medicare Other | Admitting: Physical Medicine and Rehabilitation

## 2016-11-18 ENCOUNTER — Ambulatory Visit (INDEPENDENT_AMBULATORY_CARE_PROVIDER_SITE_OTHER): Payer: Medicare Other

## 2016-11-18 ENCOUNTER — Telehealth (INDEPENDENT_AMBULATORY_CARE_PROVIDER_SITE_OTHER): Payer: Self-pay | Admitting: Physical Medicine and Rehabilitation

## 2016-11-18 VITALS — BP 163/93 | HR 73

## 2016-11-18 DIAGNOSIS — M4726 Other spondylosis with radiculopathy, lumbar region: Secondary | ICD-10-CM

## 2016-11-18 DIAGNOSIS — M25511 Pain in right shoulder: Secondary | ICD-10-CM | POA: Diagnosis not present

## 2016-11-18 DIAGNOSIS — M79642 Pain in left hand: Secondary | ICD-10-CM | POA: Diagnosis not present

## 2016-11-18 DIAGNOSIS — M5416 Radiculopathy, lumbar region: Secondary | ICD-10-CM

## 2016-11-18 DIAGNOSIS — G8929 Other chronic pain: Secondary | ICD-10-CM

## 2016-11-18 DIAGNOSIS — M79641 Pain in right hand: Secondary | ICD-10-CM | POA: Diagnosis not present

## 2016-11-18 NOTE — Progress Notes (Signed)
[name] - 76 y.o. female MRN [id number]  Date of birth: 10-26-1940  Office Visit Note: Visit Date: 11/18/2016 PCP: Loura Pardon, MD Referred by: Tower, Wynelle Fanny, MD  Subjective: Chief Complaint  Patient presents with  . Lower Back - Pain   HPI: HPI  Back, right shoulder, and bilateral hand pain. "Hurts all over." Pain in lower back radiates down both legs/ buttock pain. Right is worse than left. Sitting and standing increases pain- feels better to walk. Pain for about 2 weeks this time. ROS Otherwise per HPI.  Assessment & Plan: Visit Diagnoses:  1. Chronic right shoulder pain   2. Other spondylosis with radiculopathy, lumbar region   3. Pain in right hand   4. Pain in left hand   5. Lumbar radiculopathy     Plan: Findings:  Chronic worsening axial low back pain with referral into both hamstrings and the patient with intermittent epidural steroid injections of helped over the years. These have been bilateral S1 transforaminal injections. She has MRI findings in the past of multilevel facet arthropathy and lateral recess narrowing but not much in the way of central canal stenosis at all. Degenerative changes of the lower disc with bi-foraminal narrowing. She has consistent symptoms with this once again and it seems like it's just flared up on her. She also has widespread pain in all areas including the neck shoulders arms and hands legs etc. She does not carry a diagnosis of fibromyalgia. Examination of the right shoulder shows impingement sign and clear subacromial bursitis. She could have rotator cuff issues but negative drop arm test. She is seen Dr. Marlou Sa in the past for her orthopedic care. Today what I go ahead and complete a diagnostic subacromial joint injection if that helps quite a bit we'll watch her. Depending on the relief she gets a follow-up with Dr. Marlou Sa. She has pain in the hands with clear osteophytic changes of the hands. Again with pilar follow-up with Dr. Marlou Sa  depending on the relief with that. I don't think she has carpal tunnel syndrome although she does endorse pain in the wrist and fullness in the hands at times. In terms of her low back we are going to set her up for an appointment for diagnostic and therapeutic bilateral S1 transforaminal injections. She's failed conservative care in the past has had good relief with these in the past.  I spent more than 30 minutes speaking face-to-face with the patient with 50% of the time in counseling.    Meds & Orders: No orders of the defined types were placed in this encounter.   Orders Placed This Encounter  Procedures  . Large Joint Injection/Arthrocentesis  . XR Shoulder Right  . XR Lumbar Spine 2-3 Views    Follow-up: Return for Bilateral S1 transforaminal epidural steroid injections..   Procedures: Right subacromial bursa injection patient did have relief during the anesthetic phase of the injection with increased range of motion. Date/Time: 11/18/2016 10:08 AM Performed by: Magnus Sinning Authorized by: Magnus Sinning   Consent Given by:  Patient Site marked: the procedure site was marked   Timeout: prior to procedure the correct patient, procedure, and site was verified   Indications:  Pain Location:  Shoulder Site:  R subacromial bursa Prep: patient was prepped and draped in usual sterile fashion   Needle Size:  22 G Needle Length:  1.5 inches Approach:  Posterior Ultrasound Guidance: No   Fluoroscopic Guidance: No   Arthrogram: No   Medications:  2 mL bupivacaine 0.5 %; 2 mL lidocaine 1 %; 40 mg triamcinolone acetonide 40 MG/ML Aspiration Attempted: No   Patient tolerance:  Patient tolerated the procedure well with no immediate complications  Patient did have relief during the anesthetic phase of the injection with increased range of motion.     No notes on file   Clinical History: No specialty comments available.  She reports that she quit smoking about 13 years ago. She  has never used smokeless tobacco.   Recent Labs  12/14/15 1218 03/24/16 1033 10/07/16 1535  HGBA1C 6.6* 6.4 6.3    Objective:  VS:  HT:    WT:   BMI:     BP:(!) 163/93  HR:73bpm  TEMP: ( )  RESP:  Physical Exam  Constitutional: She is oriented to person, place, and time. She appears well-developed and well-nourished. No distress.  HENT:  Head: Normocephalic and atraumatic.  Nose: Nose normal.  Mouth/Throat: Oropharynx is clear and moist.  Eyes: Conjunctivae are normal. Pupils are equal, round, and reactive to light.  Neck: Normal range of motion. Neck supple.  Cardiovascular: Regular rhythm and intact distal pulses.   Pulmonary/Chest: Effort normal and breath sounds normal.  Abdominal: She exhibits no distension.  Musculoskeletal:  Cervical examination reveals decent range of motion with forward set cervical spine. She does have some trigger points in the levator scapula and trapezius bilaterally. She has shoulder impingement signs on the right with XR rotation. She has a negative speed's test and no tenderness around the before meals joint or biceps tendon. She has a negative drop arm test. She has good strength in the upper tremor knees bilaterally. Examination of the hand shows no atrophy but she does have significant MCP joint arthritis. She has 2+ muscle strength reflexes at the biceps and brachioradialis and a negative Hoffmann's bilaterally. She has no Tinel sign at the wrists bilaterally although it elicits pain in the wrist. Any real movement of stretching or movement of the joints really elicit some pain in general. Lumbar exam shows fairly normal anatomic alignment. She has no pain with hip rotation. She has some restricted movement with the left hip. She has good distal strength without clonus. She does have pain with extension of the lumbar spine. She has a negative slump test.  Neurological: She is alert and oriented to person, place, and time. She displays normal  reflexes. No sensory deficit. She exhibits normal muscle tone. Coordination normal.  Skin: Skin is warm. No rash noted. No erythema.  Psychiatric: She has a normal mood and affect. Her behavior is normal.  Nursing note and vitals reviewed.   Ortho Exam Imaging: No results found.  Past Medical/Family/Surgical/Social History: Medications & Allergies reviewed per EMR Patient Active Problem List   Diagnosis Date Noted  . Acute sinusitis 10/28/2016  . Estrogen deficiency 10/07/2016  . Routine general medical examination at a health care facility 10/07/2016  . Adjustment disorder with mixed anxiety and depressed mood 06/24/2016  . IBS (irritable bowel syndrome) 06/09/2016  . Hyperglycemia 02/06/2016  . Essential hypertension 01/07/2016  . Gastritis 11/07/2015  . Fatty liver 11/07/2015  . Coronary atherosclerosis 11/07/2015  . Left shoulder pain 12/25/2014  . Left-sided chest wall pain 12/25/2014  . Fatigue 04/24/2014  . Obesity 04/24/2014  . Encounter for Medicare annual wellness exam 04/24/2014  . Post herpetic neuralgia 07/09/2011  . Low back pain 07/09/2011  . Left sided abdominal pain 06/12/2011  . Rheumatoid arthritis ([place]) 07/23/2007  . GERD 07/09/2007  .  HYPERCHOLESTEROLEMIA 03/19/2007  . PSVT 03/19/2007  . ALLERGIC RHINITIS 03/19/2007  . ENDOMETRIAL POLYP 03/19/2007  . POSTMENOPAUSAL STATUS 03/19/2007  . OSTEOARTHRITIS 03/19/2007  . SPINAL STENOSIS 03/19/2007  . MURMUR 03/19/2007  . NEPHROLITHIASIS, HX OF 03/19/2007   Past Medical History:  Diagnosis Date  . Allergic rhinitis   . Anxiety   . Cataract 2015   bilateral  . Diverticulosis   . Esophageal reflux   . Essential hypertension 01/07/2016  . Fibula fracture 02/2005  . Former smoker   . Gastritis   . Heart murmur   . Hemorrhoids   . History of nephrolithiasis   . Hyperlipemia   . Osteoarthritis   . Personal history of colonic polyps 1998   hyperplastic  . RA (rheumatoid arthritis) ([place])   . Shingles  2012   Family History  Problem Relation Age of Onset  . Colon cancer Father 40  . Diabetes Mother   . Heart disease Mother   . Kidney disease Mother     ESRD  . Stroke Mother   . Hypertension Brother   . Breast cancer Sister   . Diabetes Other     nephews  . Colon cancer Sister   . Esophageal cancer Neg Hx   . Stomach cancer Neg Hx   . Rectal cancer Neg Hx    Past Surgical History:  Procedure Laterality Date  . CHOLECYSTECTOMY    . COLONOSCOPY  11/08   internal hemorrhoids  . EP procedure  1994   SVT; normal echo  . ESOPHAGOGASTRODUODENOSCOPY  11/03   reactive gastropathy  . LIPOMA EXCISION  11/2002  . SVT s/p surgery--? ablation    . UPPER GASTROINTESTINAL ENDOSCOPY    . vaginal polyp excised  12/2000   benign   Social History   Occupational History  . retired   .  Retired   Social History Main Topics  . Smoking status: Former Smoker    Quit date: 10/21/2003  . Smokeless tobacco: Never Used  . Alcohol use No  . Drug use: No  . Sexual activity: Yes

## 2016-11-19 NOTE — Telephone Encounter (Signed)
Active [employer] coverage and no prior auth required per [facility] website

## 2016-11-20 MED ORDER — BUPIVACAINE HCL 0.5 % IJ SOLN
2.0000 mL | INTRAMUSCULAR | Status: AC | PRN
  Administered 2016-11-18: 2 mL via INTRA_ARTICULAR

## 2016-11-20 MED ORDER — TRIAMCINOLONE ACETONIDE 40 MG/ML IJ SUSP
40.0000 mg | INTRAMUSCULAR | Status: AC | PRN
  Administered 2016-11-18: 40 mg via INTRA_ARTICULAR

## 2016-11-20 MED ORDER — LIDOCAINE HCL 1 % IJ SOLN
2.0000 mL | INTRAMUSCULAR | Status: AC | PRN
  Administered 2016-11-18: 2 mL

## 2016-11-24 ENCOUNTER — Ambulatory Visit (INDEPENDENT_AMBULATORY_CARE_PROVIDER_SITE_OTHER): Payer: Medicare Other | Admitting: Physical Medicine and Rehabilitation

## 2016-11-24 ENCOUNTER — Encounter (INDEPENDENT_AMBULATORY_CARE_PROVIDER_SITE_OTHER): Payer: Self-pay | Admitting: Physical Medicine and Rehabilitation

## 2016-11-24 VITALS — BP 132/82 | HR 74 | Temp 98.3°F

## 2016-11-24 DIAGNOSIS — M5416 Radiculopathy, lumbar region: Secondary | ICD-10-CM | POA: Diagnosis not present

## 2016-11-24 NOTE — Progress Notes (Signed)
[name] - 76 y.o. female MRN [id number]  Date of birth: 1941/02/22  Office Visit Note: Visit Date: 11/24/2016 PCP: Loura Pardon, MD Referred by: Tower, Wynelle Fanny, MD  Subjective: Chief Complaint  [name] presents with  . Lower Back - Pain   HPI: [name] is a pleasant 76 year old female that I recently saw evaluation and management and completed a right subacromial shoulder injection. [name] had good relief and range of motion during Amy anesthetic phase of Amy injection but really has not sustained much relief with that. I am going to refer [name] to Dr. Marlou Sa who [name] is seen in Amy past for evaluation and management of [name] shoulder pain. [name] has no radicular symptoms. In terms of [name] low back and legs again [name] symptoms are low back and bilateral leg pain somewhat of an L5 and S1 distribution. We decided last time we saw [name] to go ahead and complete S1 transforaminal injections because they've helped [name] so much in Amy past. Obviously if it is not beneficial we might look at reimaging [name] since [name] MRI was from 2013. [name] has questions today about Amy leg pain and knee pain. I think while [name] sees Dr. Marlou Sa [name] can ask him about [name] knees are not evaluated those and he would be a better person to look at [name] knees from a standpoint of this is causing [name] symptoms in Amy legs. [name] is here today for planned bilateral S1 transforaminal injection. No change in symptoms.     ROS Otherwise per HPI.  Assessment & Plan: Visit Diagnoses:  1. Lumbar radiculopathy     Plan: Findings:  Bilateral S1 transforaminal epidural steroid injection.    Meds & Orders:  Meds ordered this encounter  Medications  . lidocaine (PF) (XYLOCAINE) 1 % injection 0.3 mL  . methylPREDNISolone acetate (DEPO-MEDROL) injection 80 mg    Orders Placed This Encounter  Procedures  . XR C-ARM NO REPORT  . Epidural Steroid injection    Follow-up: Return for Appointment ot see Dr. Marlou Sa for Shoulder and possibly  knees.   Procedures: No procedures performed  S1 Lumbosacral Transforaminal Epidural Steroid Injection - Sub-Pedicular Approach with Fluoroscopic Guidance   [name]: [name]      Date of Birth: 05/29/1941 MRN: [id number] PCP: Loura Pardon, MD      Visit Date: 11/24/2016   Universal Protocol:    Date/Time: 02/06/186:05 AM  Consent Given By: [name]  Position:  PRONE  Additional Comments: Vital signs were monitored before and after Amy procedure. [name] was prepped and draped in Amy usual sterile fashion. Amy correct [name], procedure, and site was verified.   Injection Procedure Details:  Procedure Site One Meds Administered:  Meds ordered this encounter  Medications  . lidocaine (PF) (XYLOCAINE) 1 % injection 0.3 mL  . methylPREDNISolone acetate (DEPO-MEDROL) injection 80 mg    Laterality: Bilateral  Location/Site:  S1 Foramen   Needle size: 22 G  Needle type: Spinal  Needle Placement: Transforaminal  Findings:  -Contrast Used: 1 mL iohexol 180 mg iodine/mL   -Comments: Excellent flow of contrast along Amy nerve and into Amy epidural space.  Procedure Details: After squaring off Amy sacral end-plate to get a true AP view, Amy C-arm was positioned so that Amy best possible view of Amy S1 foramen was visualized. Amy soft tissues overlying this structure were infiltrated with 2-3 ml. of 1% Lidocaine without Epinephrine.    Amy spinal needle was inserted toward Amy target using a "  trajectory" view along Amy fluoroscope beam.  Under AP and lateral visualization, Amy needle was advanced so it did not puncture dura. Biplanar projections were used to confirm position. Aspiration was confirmed to be negative for CSF and/or blood. A 1-2 ml. volume of Isovue-250 was injected and flow of contrast was noted at each level. Radiographs were obtained for documentation purposes.   After attaining Amy desired flow of contrast documented above, a 0.5 to 1.0 ml test  dose of 0.25% Marcaine was injected into each respective transforaminal space.  [name] was observed for 90 seconds post injection.  After no sensory deficits were reported, and normal lower extremity motor function was noted,   Amy above injectate was administered so that equal amounts of Amy injectate were placed at each foramen (level) into Amy transforaminal epidural space.   Additional Comments:  [name] tolerated Amy procedure well Dressing: Band-Aid    Post-procedure details: [name] was observed during Amy procedure. Post-procedure instructions were reviewed.  [name] left Amy clinic in stable condition.     Clinical History: No specialty comments available.  [name] reports that [name] quit smoking about 13 years ago. [name] has never used smokeless tobacco.   Recent Labs  12/14/15 1218 03/24/16 1033 10/07/16 1535  HGBA1C 6.6* 6.4 6.3    Objective:  VS:  HT:    WT:   BMI:     BP:132/82  HR:74bpm  TEMP:98.3 F (36.8 C)(Oral)  RESP:96 % Physical Exam  Musculoskeletal:  [name] ambulates without aid with a normal gait. [name] has good distal strength.    Ortho Exam Imaging: No results found.  Past Medical/Family/Surgical/Social History: Medications & Allergies reviewed per EMR [name] Active Problem List   Diagnosis Date Noted  . Acute sinusitis 10/28/2016  . Estrogen deficiency 10/07/2016  . Routine general medical examination at a health care facility 10/07/2016  . Adjustment disorder with mixed anxiety and depressed mood 06/24/2016  . IBS (irritable bowel syndrome) 06/09/2016  . Hyperglycemia 02/06/2016  . Essential hypertension 01/07/2016  . Gastritis 11/07/2015  . Fatty liver 11/07/2015  . Coronary atherosclerosis 11/07/2015  . Left shoulder pain 12/25/2014  . Left-sided chest wall pain 12/25/2014  . Fatigue 04/24/2014  . Obesity 04/24/2014  . Encounter for Medicare annual wellness exam 04/24/2014  . Post herpetic neuralgia 07/09/2011  . Low back  pain 07/09/2011  . Left sided abdominal pain 06/12/2011  . Rheumatoid arthritis ([place]) 07/23/2007  . GERD 07/09/2007  . HYPERCHOLESTEROLEMIA 03/19/2007  . PSVT 03/19/2007  . ALLERGIC RHINITIS 03/19/2007  . ENDOMETRIAL POLYP 03/19/2007  . POSTMENOPAUSAL STATUS 03/19/2007  . OSTEOARTHRITIS 03/19/2007  . SPINAL STENOSIS 03/19/2007  . MURMUR 03/19/2007  . NEPHROLITHIASIS, HX OF 03/19/2007   Past Medical History:  Diagnosis Date  . Allergic rhinitis   . Anxiety   . Cataract 2015   bilateral  . Diverticulosis   . Esophageal reflux   . Essential hypertension 01/07/2016  . Fibula fracture 02/2005  . Former smoker   . Gastritis   . Heart murmur   . Hemorrhoids   . History of nephrolithiasis   . Hyperlipemia   . Osteoarthritis   . Personal history of colonic polyps 1998   hyperplastic  . RA (rheumatoid arthritis) ([place])   . Shingles 2012   Family History  Problem Relation Age of Onset  . Colon cancer Father 17  . Diabetes Mother   . Heart disease Mother   . Kidney disease Mother     ESRD  . Stroke Mother   .  Hypertension Brother   . Breast cancer Sister   . Diabetes Other     nephews  . Colon cancer Sister   . Esophageal cancer Neg Hx   . Stomach cancer Neg Hx   . Rectal cancer Neg Hx    Past Surgical History:  Procedure Laterality Date  . CHOLECYSTECTOMY    . COLONOSCOPY  11/08   internal hemorrhoids  . EP procedure  1994   SVT; normal echo  . ESOPHAGOGASTRODUODENOSCOPY  11/03   reactive gastropathy  . LIPOMA EXCISION  11/2002  . SVT s/p surgery--? ablation    . UPPER GASTROINTESTINAL ENDOSCOPY    . vaginal polyp excised  12/2000   benign   Social History   Occupational History  . retired   .  Retired   Social History Main Topics  . Smoking status: Former Smoker    Quit date: 10/21/2003  . Smokeless tobacco: Never Used  . Alcohol use No  . Drug use: No  . Sexual activity: Yes

## 2016-11-24 NOTE — Patient Instructions (Signed)

## 2016-11-25 ENCOUNTER — Ambulatory Visit (INDEPENDENT_AMBULATORY_CARE_PROVIDER_SITE_OTHER): Payer: Self-pay

## 2016-11-25 MED ORDER — LIDOCAINE HCL (PF) 1 % IJ SOLN: 0.3300 mL | Freq: Once | INTRAMUSCULAR | Status: DC

## 2016-11-25 MED ORDER — METHYLPREDNISOLONE ACETATE 80 MG/ML IJ SUSP
80.0000 mg | Freq: Once | INTRAMUSCULAR | Status: AC
  Administered 2016-11-24: 80 mg

## 2016-11-25 NOTE — Procedures (Signed)
S1 Lumbosacral Transforaminal Epidural Steroid Injection - Sub-Pedicular Approach with Fluoroscopic Guidance   Patient: [name]      Date of Birth: 06-05-1941 MRN: [id number] PCP: Loura Pardon, MD      Visit Date: 11/24/2016   Universal Protocol:    Date/Time: 02/06/186:05 AM  Consent Given By: the patient  Position:  PRONE  Additional Comments: Vital signs were monitored before and after the procedure. Patient was prepped and draped in the usual sterile fashion. The correct patient, procedure, and site was verified.   Injection Procedure Details:  Procedure Site One Meds Administered:  Meds ordered this encounter  Medications  . lidocaine (PF) (XYLOCAINE) 1 % injection 0.3 mL  . methylPREDNISolone acetate (DEPO-MEDROL) injection 80 mg    Laterality: Bilateral  Location/Site:  S1 Foramen   Needle size: 22 G  Needle type: Spinal  Needle Placement: Transforaminal  Findings:  -Contrast Used: 1 mL iohexol 180 mg iodine/mL   -Comments: Excellent flow of contrast along the nerve and into the epidural space.  Procedure Details: After squaring off the sacral end-plate to get a true AP view, the C-arm was positioned so that the best possible view of the S1 foramen was visualized. The soft tissues overlying this structure were infiltrated with 2-3 ml. of 1% Lidocaine without Epinephrine.    The spinal needle was inserted toward the target using a "trajectory" view along the fluoroscope beam.  Under AP and lateral visualization, the needle was advanced so it did not puncture dura. Biplanar projections were used to confirm position. Aspiration was confirmed to be negative for CSF and/or blood. A 1-2 ml. volume of Isovue-250 was injected and flow of contrast was noted at each level. Radiographs were obtained for documentation purposes.   After attaining the desired flow of contrast documented above, a 0.5 to 1.0 ml test dose of 0.25% Marcaine was injected into each  respective transforaminal space.  The patient was observed for 90 seconds post injection.  After no sensory deficits were reported, and normal lower extremity motor function was noted,   the above injectate was administered so that equal amounts of the injectate were placed at each foramen (level) into the transforaminal epidural space.   Additional Comments:  The patient tolerated the procedure well Dressing: Band-Aid    Post-procedure details: Patient was observed during the procedure. Post-procedure instructions were reviewed.  Patient left the clinic in stable condition.

## 2016-12-17 ENCOUNTER — Ambulatory Visit (INDEPENDENT_AMBULATORY_CARE_PROVIDER_SITE_OTHER): Payer: Medicare Other

## 2016-12-17 ENCOUNTER — Ambulatory Visit (INDEPENDENT_AMBULATORY_CARE_PROVIDER_SITE_OTHER): Payer: Medicare Other | Admitting: Orthopedic Surgery

## 2016-12-17 ENCOUNTER — Encounter (INDEPENDENT_AMBULATORY_CARE_PROVIDER_SITE_OTHER): Payer: Self-pay | Admitting: Orthopedic Surgery

## 2016-12-17 DIAGNOSIS — G8929 Other chronic pain: Secondary | ICD-10-CM

## 2016-12-17 DIAGNOSIS — M25562 Pain in left knee: Secondary | ICD-10-CM

## 2016-12-17 DIAGNOSIS — M25511 Pain in right shoulder: Secondary | ICD-10-CM | POA: Diagnosis not present

## 2016-12-17 DIAGNOSIS — M25561 Pain in right knee: Secondary | ICD-10-CM

## 2016-12-17 MED ORDER — BUPIVACAINE HCL 0.25 % IJ SOLN
4.0000 mL | INTRAMUSCULAR | Status: AC | PRN
  Administered 2016-12-17: 4 mL via INTRA_ARTICULAR

## 2016-12-17 MED ORDER — METHYLPREDNISOLONE ACETATE 40 MG/ML IJ SUSP
40.0000 mg | INTRAMUSCULAR | Status: AC | PRN
  Administered 2016-12-17: 40 mg via INTRA_ARTICULAR

## 2016-12-17 MED ORDER — LIDOCAINE HCL 1 % IJ SOLN
5.0000 mL | INTRAMUSCULAR | Status: AC | PRN
  Administered 2016-12-17: 5 mL

## 2016-12-17 NOTE — Progress Notes (Signed)
Office Visit Note   Patient: [name]           Date of Birth: 1941-02-21           MRN: [id number] Visit Date: 12/17/2016 Requested by: Abner Greenspan, MD [place], [place] [address] PCP: Loura Pardon, MD  Subjective: Chief Complaint  Patient presents with  . Left Knee - Pain  . Right Knee - Pain  . Right Shoulder - Pain    HPI [name] is a 76 year old patient with right shoulder pain and bilateral knee pain.  Today look pretty good in terms of absence of arthritis in the knees.  She had an injection in the right shoulder with Dr. Ernestina Patches a month ago.  Also had a back injection month ago which helped her back.  Still having some radiation into her buttocks but she does not want back surgery.  She denies any real mechanical symptoms in the knees but does report some popping and grinding.              Review of Systems All systems reviewed are negative as they relate to the chief complaint within the history of present illness.  Patient denies  fevers or chills.    Assessment & Plan: Visit Diagnoses:  1. Chronic pain of both knees   2. Chronic right shoulder pain     Plan: Impression is bilateral knee patellofemoral crepitus and grinding which we will try an injection for today in both knees.  The shoulder is something that we'll watch for now.  Does not really want to proceed with surgical intervention at this time.  In regards to the tobacco think that's something that Dr. Ernestina Patches is managing.  Nonweightbearing quad strengthening exercises encouraged.  I'll see her back as needed  Follow-Up Instructions: No Follow-up on file.   Orders:  Orders Placed This Encounter  Procedures  . XR Knee 1-2 Views Left  . XR KNEE 3 VIEW RIGHT   No orders of the defined types were placed in this encounter.     Procedures: Large Joint Inj Date/Time: 12/17/2016 11:44 AM Performed by: Meredith Pel Authorized by: Meredith Pel   Consent Given by:   Patient Site marked: the procedure site was marked   Timeout: prior to procedure the correct patient, procedure, and site was verified   Indications:  Pain, joint swelling and diagnostic evaluation Location:  Knee Site:  R knee Prep: patient was prepped and draped in usual sterile fashion   Needle Size:  18 G Needle Length:  1.5 inches Approach:  Superolateral Ultrasound Guidance: No   Fluoroscopic Guidance: No   Arthrogram: No   Medications:  5 mL lidocaine 1 %; 4 mL bupivacaine 0.25 %; 40 mg methylPREDNISolone acetate 40 MG/ML Patient tolerance:  Patient tolerated the procedure well with no immediate complications Large Joint Inj Date/Time: 12/17/2016 11:44 AM Performed by: Meredith Pel Authorized by: Meredith Pel   Consent Given by:  Patient Site marked: the procedure site was marked   Timeout: prior to procedure the correct patient, procedure, and site was verified   Indications:  Pain, joint swelling and diagnostic evaluation Location:  Knee Site:  L knee Prep: patient was prepped and draped in usual sterile fashion   Needle Size:  18 G Needle Length:  1.5 inches Approach:  Superolateral Ultrasound Guidance: No   Fluoroscopic Guidance: No   Arthrogram: No   Medications:  5 mL lidocaine 1 %; 4  mL bupivacaine 0.25 %; 40 mg methylPREDNISolone acetate 40 MG/ML Patient tolerance:  Patient tolerated the procedure well with no immediate complications     Clinical Data: No additional findings.  Objective: Vital Signs: There were no vitals taken for this visit.  Physical Exam   Constitutional: Patient appears well-developed HEENT:  Head: Normocephalic Eyes:EOM are normal Neck: Normal range of motion Cardiovascular: Normal rate Pulmonary/chest: Effort normal Neurologic: Patient is alert Skin: Skin is warm Psychiatric: Patient has normal mood and affect    Ortho Exam orthopedic exam demonstrates neck extension about 20 flexion chin is closer to the  chest.  All rotation both sides.  She has more forward active elevation on the left than the right 160 compared to 120.  Rotator cuff strength is reasonable but painful on the right-hand side with interspace interspace and subscap muscle testing.  She does have a little bit of coarseness with internal/external rotation on the right-hand side but not on the left-hand side.  In regards to the knees the patient has no effusion in either knee but significant patellofemoral crepitus.  Extensor mechanism is intact.  Pedal pulses palpable.  There is no groin pain with internal/external rotation of the leg no nerve root tension signs today.  Collateral crucial ligaments stable in both knees  Specialty Comments:  No specialty comments available.  Imaging: No results found.   PMFS History: Patient Active Problem List   Diagnosis Date Noted  . Acute sinusitis 10/28/2016  . Estrogen deficiency 10/07/2016  . Routine general medical examination at a health care facility 10/07/2016  . Adjustment disorder with mixed anxiety and depressed mood 06/24/2016  . IBS (irritable bowel syndrome) 06/09/2016  . Hyperglycemia 02/06/2016  . Essential hypertension 01/07/2016  . Gastritis 11/07/2015  . Fatty liver 11/07/2015  . Coronary atherosclerosis 11/07/2015  . Left shoulder pain 12/25/2014  . Left-sided chest wall pain 12/25/2014  . Fatigue 04/24/2014  . Obesity 04/24/2014  . Encounter for Medicare annual wellness exam 04/24/2014  . Post herpetic neuralgia 07/09/2011  . Low back pain 07/09/2011  . Left sided abdominal pain 06/12/2011  . Rheumatoid arthritis ([place]) 07/23/2007  . GERD 07/09/2007  . HYPERCHOLESTEROLEMIA 03/19/2007  . PSVT 03/19/2007  . ALLERGIC RHINITIS 03/19/2007  . ENDOMETRIAL POLYP 03/19/2007  . POSTMENOPAUSAL STATUS 03/19/2007  . OSTEOARTHRITIS 03/19/2007  . SPINAL STENOSIS 03/19/2007  . MURMUR 03/19/2007  . NEPHROLITHIASIS, HX OF 03/19/2007   Past Medical History:  Diagnosis Date    . Allergic rhinitis   . Anxiety   . Cataract 2015   bilateral  . Diverticulosis   . Esophageal reflux   . Essential hypertension 01/07/2016  . Fibula fracture 02/2005  . Former smoker   . Gastritis   . Heart murmur   . Hemorrhoids   . History of nephrolithiasis   . Hyperlipemia   . Osteoarthritis   . Personal history of colonic polyps 1998   hyperplastic  . RA (rheumatoid arthritis) ([place])   . Shingles 2012    Family History  Problem Relation Age of Onset  . Colon cancer Father 7  . Diabetes Mother   . Heart disease Mother   . Kidney disease Mother     ESRD  . Stroke Mother   . Hypertension Brother   . Breast cancer Sister   . Diabetes Other     nephews  . Colon cancer Sister   . Esophageal cancer Neg Hx   . Stomach cancer Neg Hx   . Rectal cancer Neg Hx  Past Surgical History:  Procedure Laterality Date  . CHOLECYSTECTOMY    . COLONOSCOPY  11/08   internal hemorrhoids  . EP procedure  1994   SVT; normal echo  . ESOPHAGOGASTRODUODENOSCOPY  11/03   reactive gastropathy  . LIPOMA EXCISION  11/2002  . SVT s/p surgery--? ablation    . UPPER GASTROINTESTINAL ENDOSCOPY    . vaginal polyp excised  12/2000   benign   Social History   Occupational History  . retired   .  Retired   Social History Main Topics  . Smoking status: Former Smoker    Quit date: 10/21/2003  . Smokeless tobacco: Never Used  . Alcohol use No  . Drug use: No  . Sexual activity: Yes

## 2016-12-25 ENCOUNTER — Ambulatory Visit (INDEPENDENT_AMBULATORY_CARE_PROVIDER_SITE_OTHER): Payer: Medicare Other | Admitting: Surgery

## 2017-01-08 ENCOUNTER — Ambulatory Visit (INDEPENDENT_AMBULATORY_CARE_PROVIDER_SITE_OTHER): Payer: Medicare Other | Admitting: Orthopedic Surgery

## 2017-01-08 ENCOUNTER — Encounter (INDEPENDENT_AMBULATORY_CARE_PROVIDER_SITE_OTHER): Payer: Self-pay | Admitting: Orthopedic Surgery

## 2017-01-08 DIAGNOSIS — M25562 Pain in left knee: Secondary | ICD-10-CM | POA: Diagnosis not present

## 2017-01-08 DIAGNOSIS — M25561 Pain in right knee: Secondary | ICD-10-CM

## 2017-01-08 MED ORDER — TRAMADOL HCL 50 MG PO TABS: 50.0000 mg | ORAL_TABLET | Freq: Three times a day (TID) | ORAL | 0 refills | Status: DC | PRN

## 2017-01-08 NOTE — Progress Notes (Signed)
Office Visit Note   Patient: [name]           Date of Birth: 19-Mar-1941           MRN: [id number] Visit Date: 01/08/2017 Requested by: Abner Greenspan, MD [place], [place] [address] PCP: Loura Pardon, MD  Subjective: Chief Complaint  Patient presents with  . Left Knee - Follow-up  . Right Knee - Follow-up    HPI: [name] is a 76 year old female with bilateral knee pain.  She had injections on 12/17/2016 which were not helpful.  In general her knees are painful the right is worse than left.  She's been in bed for about 2 weeks.  Tart for her to walk.  She is taking Tylenol for pain.  She did have prednisone.              ROS: All systems reviewed are negative as they relate to the chief complaint within the history of present illness.  Patient denies  fevers or chills.   Assessment & Plan: Visit Diagnoses:  1. 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  I would try her on tramadol.  She also is having some swelling in her hands which looks like it could be worsening of rheumatoid arthritis.  I think she needs to see her rheumatologist Dr. Kizzie Bane.  I'll see her back when she is rate for knee replacement.  Follow-Up Instructions: Return if symptoms worsen or fail to improve.   Orders:  No orders of the defined types were placed in this encounter.  Meds ordered this encounter  Medications  . traMADol (ULTRAM) 50 MG tablet    Sig: Take 1 tablet (50 mg total) by mouth every 8 (eight) hours as needed.    Dispense:  45 tablet    Refill:  0      Procedures: No procedures performed   Clinical Data: No additional findings.  Objective: Vital Signs: There were no vitals taken for this visit.  Physical Exam:   Constitutional: Patient appears well-developed HEENT:  Head: Normocephalic Eyes:EOM are normal Neck: Normal range of  motion Cardiovascular: Normal rate Pulmonary/chest: Effort normal Neurologic: Patient is alert Skin: Skin is warm Psychiatric: Patient has normal mood and affect    Ortho Exam: Orthopedic exam demonstrates trace bilateral knee effusions but with painful range of motion.  Flexion contractures are present less than 10.  Pedal pulses palpable.  Extensor mechanism is intact.  Bilateral patellofemoral crepitus is present.  No groin pain with internal/external rotation of the leg.  Specialty Comments:  No specialty comments available.  Imaging: No results found.   PMFS History: Patient Active Problem List   Diagnosis Date Noted  . Acute sinusitis 10/28/2016  . Estrogen deficiency 10/07/2016  . Routine general medical examination at a health care facility 10/07/2016  . Adjustment disorder with mixed anxiety and depressed mood 06/24/2016  . IBS (irritable bowel syndrome) 06/09/2016  . Hyperglycemia 02/06/2016  . Essential hypertension 01/07/2016  . Gastritis 11/07/2015  . Fatty liver 11/07/2015  . Coronary atherosclerosis 11/07/2015  . Left shoulder pain 12/25/2014  . Left-sided chest wall pain 12/25/2014  . Fatigue 04/24/2014  . Obesity 04/24/2014  . Encounter for Medicare annual wellness exam 04/24/2014  . Post herpetic neuralgia 07/09/2011  . Low back pain 07/09/2011  . Left sided abdominal pain 06/12/2011  . Rheumatoid arthritis ([place]) 07/23/2007  .  GERD 07/09/2007  . HYPERCHOLESTEROLEMIA 03/19/2007  . PSVT 03/19/2007  . ALLERGIC RHINITIS 03/19/2007  . ENDOMETRIAL POLYP 03/19/2007  . POSTMENOPAUSAL STATUS 03/19/2007  . OSTEOARTHRITIS 03/19/2007  . SPINAL STENOSIS 03/19/2007  . MURMUR 03/19/2007  . NEPHROLITHIASIS, HX OF 03/19/2007   Past Medical History:  Diagnosis Date  . Allergic rhinitis   . Anxiety   . Cataract 2015   bilateral  . Diverticulosis   . Esophageal reflux   . Essential hypertension 01/07/2016  . Fibula fracture 02/2005  . Former smoker   .  Gastritis   . Heart murmur   . Hemorrhoids   . History of nephrolithiasis   . Hyperlipemia   . Osteoarthritis   . Personal history of colonic polyps 1998   hyperplastic  . RA (rheumatoid arthritis) ([place])   . Shingles 2012    Family History  Problem Relation Age of Onset  . Colon cancer Father 23  . Diabetes Mother   . Heart disease Mother   . Kidney disease Mother     ESRD  . Stroke Mother   . Hypertension Brother   . Breast cancer Sister   . Diabetes Other     nephews  . Colon cancer Sister   . Esophageal cancer Neg Hx   . Stomach cancer Neg Hx   . Rectal cancer Neg Hx     Past Surgical History:  Procedure Laterality Date  . CHOLECYSTECTOMY    . COLONOSCOPY  11/08   internal hemorrhoids  . EP procedure  1994   SVT; normal echo  . ESOPHAGOGASTRODUODENOSCOPY  11/03   reactive gastropathy  . LIPOMA EXCISION  11/2002  . SVT s/p surgery--? ablation    . UPPER GASTROINTESTINAL ENDOSCOPY    . vaginal polyp excised  12/2000   benign   Social History   Occupational History  . retired   .  Retired   Social History Main Topics  . Smoking status: Former Smoker    Quit date: 10/21/2003  . Smokeless tobacco: Never Used  . Alcohol use No  . Drug use: No  . Sexual activity: Yes

## 2017-01-14 DIAGNOSIS — M79641 Pain in right hand: Secondary | ICD-10-CM | POA: Diagnosis not present

## 2017-01-14 DIAGNOSIS — M79642 Pain in left hand: Secondary | ICD-10-CM | POA: Diagnosis not present

## 2017-01-14 DIAGNOSIS — M545 Low back pain: Secondary | ICD-10-CM | POA: Diagnosis not present

## 2017-01-14 DIAGNOSIS — Z79899 Other long term (current) drug therapy: Secondary | ICD-10-CM | POA: Diagnosis not present

## 2017-01-14 DIAGNOSIS — M0579 Rheumatoid arthritis with rheumatoid factor of multiple sites without organ or systems involvement: Secondary | ICD-10-CM | POA: Diagnosis not present

## 2017-01-20 ENCOUNTER — Emergency Department (HOSPITAL_COMMUNITY): Payer: Medicare Other

## 2017-01-20 ENCOUNTER — Emergency Department (HOSPITAL_COMMUNITY)
Admission: EM | Admit: 2017-01-20 | Discharge: 2017-01-20 | Disposition: A | Discharge status: Home / Self Care | Payer: Medicare Other | Source: Home / Self Care | Attending: Emergency Medicine | Admitting: Emergency Medicine

## 2017-01-20 ENCOUNTER — Telehealth: Payer: Self-pay | Admitting: Family Medicine

## 2017-01-20 ENCOUNTER — Encounter (HOSPITAL_COMMUNITY): Payer: Self-pay | Admitting: Emergency Medicine

## 2017-01-20 DIAGNOSIS — Z87891 Personal history of nicotine dependence: Secondary | ICD-10-CM | POA: Insufficient documentation

## 2017-01-20 DIAGNOSIS — R1030 Lower abdominal pain, unspecified: Secondary | ICD-10-CM | POA: Diagnosis not present

## 2017-01-20 DIAGNOSIS — I2510 Atherosclerotic heart disease of native coronary artery without angina pectoris: Secondary | ICD-10-CM

## 2017-01-20 DIAGNOSIS — K5731 Diverticulosis of large intestine without perforation or abscess with bleeding: Secondary | ICD-10-CM

## 2017-01-20 DIAGNOSIS — K922 Gastrointestinal hemorrhage, unspecified: Secondary | ICD-10-CM

## 2017-01-20 DIAGNOSIS — Z79899 Other long term (current) drug therapy: Secondary | ICD-10-CM

## 2017-01-20 DIAGNOSIS — K625 Hemorrhage of anus and rectum: Secondary | ICD-10-CM

## 2017-01-20 LAB — COMPREHENSIVE METABOLIC PANEL
ALK PHOS: 66 U/L (ref 38–126)
ALT: 17 U/L (ref 14–54)
AST: 22 U/L (ref 15–41)
Albumin: 3.5 g/dL (ref 3.5–5.0)
Anion gap: 9 (ref 5–15)
BUN: 14 mg/dL (ref 6–20)
CHLORIDE: 105 mmol/L (ref 101–111)
CO2: 23 mmol/L (ref 22–32)
CREATININE: 0.76 mg/dL (ref 0.44–1.00)
Calcium: 8.9 mg/dL (ref 8.9–10.3)
GFR calc Af Amer: >60 mL/min (ref >60)
GFR calc non Af Amer: >60 mL/min (ref >60)
Glucose, Bld: 152 mg/dL — ABNORMAL HIGH (ref 65–99)
Potassium: 3.8 mmol/L (ref 3.5–5.1)
SODIUM: 137 mmol/L (ref 135–145)
Total Bilirubin: 0.5 mg/dL (ref 0.3–1.2)
Total Protein: 7.1 g/dL (ref 6.5–8.1)

## 2017-01-20 LAB — CBC
HCT: 40.6 % (ref 36.0–46.0)
Hemoglobin: 13.4 g/dL (ref 12.0–15.0)
MCH: 28.3 pg (ref 26.0–34.0)
MCHC: 33 g/dL (ref 30.0–36.0)
MCV: 85.7 fL (ref 78.0–100.0)
PLATELETS: 354 10*3/uL (ref 150–400)
RBC: 4.74 MIL/uL (ref 3.87–5.11)
RDW: 14.7 % (ref 11.5–15.5)
WBC: 12.6 10*3/uL — ABNORMAL HIGH (ref 4.0–10.5)

## 2017-01-20 LAB — ABO/RH: ABO/RH(D): O POS

## 2017-01-20 LAB — POC OCCULT BLOOD, ED: FECAL OCCULT BLD: POSITIVE — AB

## 2017-01-20 MED ORDER — HYDROCORTISONE ACETATE 25 MG RE SUPP: 25.0000 mg | Freq: Two times a day (BID) | RECTAL | 0 refills | Status: DC

## 2017-01-20 MED ORDER — MORPHINE SULFATE (PF) 4 MG/ML IV SOLN
4.0000 mg | Freq: Once | INTRAVENOUS | Status: AC
  Administered 2017-01-20: 4 mg via INTRAVENOUS
  Filled 2017-01-20: qty 1

## 2017-01-20 MED ORDER — SODIUM CHLORIDE 0.9 % IV SOLN
Freq: Once | INTRAVENOUS | Status: AC
  Administered 2017-01-20: 13:00:00 via INTRAVENOUS

## 2017-01-20 MED ORDER — IOPAMIDOL (ISOVUE-300) INJECTION 61%
INTRAVENOUS | Status: AC
  Administered 2017-01-20: 100 mL
  Filled 2017-01-20: qty 100

## 2017-01-20 NOTE — ED Triage Notes (Signed)
Pt sts episode x 1 this am of blood with BM; pt sts lower abd pain prior to bm

## 2017-01-20 NOTE — Telephone Encounter (Signed)
Patient Name: RAYNI NEMITZ DOB: 07-23-1941 Initial Comment Caller states she has blood in stool and urine Nurse Assessment Nurse: Vallery Sa, RN, [place] Date/Time (Eastern Time): 01/20/2017 9:10:24 AM Confirm and document reason for call. If symptomatic, describe symptoms. ---Mardene Celeste states she developed blood in her stool with lower, right abdominal pain (rated as a 5 on the 1 to 10 scale) this morning. She thinks she also has blood in her urine. No fever. Alert and responsive. No injury. Does the patient have any new or worsening symptoms? ---Yes Will a triage be completed? ---Yes Related visit to physician within the last 2 weeks? ---Yes Does the PT have any chronic conditions? (i.e. diabetes, asthma, etc.) ---Yes List chronic conditions. ---Arthritis Is this a behavioral health or substance abuse call? ---No Guidelines Guideline Title Affirmed Question Affirmed Notes Rectal Bleeding [1] Constant abdominal pain AND [2] present > 2 hours Final Disposition User Go to ED Now Vallery Sa, RN, [facility] - ED Disagree/Comply: Comply

## 2017-01-20 NOTE — Telephone Encounter (Signed)
Per chart review tab pt is at [facility]. 

## 2017-01-20 NOTE — ED Provider Notes (Signed)
[facility] DEPT Provider Note   CSN: [id number] Arrival date & time: 01/20/17  1029     History   Chief Complaint Chief Complaint  Patient presents with  . Rectal Bleeding    HPI [name] is a 76 y.o. female.  HPI Patient reports she developed a lower abdominal pain that was fairly severe earlier this morning. She waited a little while then decided she needed to try to have a bowel movement. She reports when she went in to have the bowel movement it was all bloody. She reports she continues to have abdominal pain despite having had a bowel movement. She reports that she does take MiraLAX daily because her doctor told her to do so for constipation. She reports this was diagnosed on an x-ray about 6 months ago. She denies problems with ongoing abdominal pain until today. No problems with nausea vomiting. Patient reports  she does have chronic reflux. Past Medical History:  Diagnosis Date  . Allergic rhinitis   . Anxiety   . Cataract 2015   bilateral  . Diverticulosis   . Esophageal reflux   . Essential hypertension 01/07/2016  . Fibula fracture 02/2005  . Former smoker   . Gastritis   . Heart murmur   . Hemorrhoids   . History of nephrolithiasis   . Hyperlipemia   . Osteoarthritis   . Personal history of colonic polyps 1998   hyperplastic  . RA (rheumatoid arthritis) ([place])   . Shingles 2012    Patient Active Problem List   Diagnosis Date Noted  . Acute sinusitis 10/28/2016  . Estrogen deficiency 10/07/2016  . Routine general medical examination at a health care facility 10/07/2016  . Adjustment disorder with mixed anxiety and depressed mood 06/24/2016  . IBS (irritable bowel syndrome) 06/09/2016  . Hyperglycemia 02/06/2016  . Essential hypertension 01/07/2016  . Gastritis 11/07/2015  . Fatty liver 11/07/2015  . Coronary atherosclerosis 11/07/2015  . Left shoulder pain 12/25/2014  . Left-sided chest wall pain 12/25/2014  . Fatigue 04/24/2014  . Obesity  04/24/2014  . Encounter for Medicare annual wellness exam 04/24/2014  . Post herpetic neuralgia 07/09/2011  . Low back pain 07/09/2011  . Left sided abdominal pain 06/12/2011  . Rheumatoid arthritis ([place]) 07/23/2007  . GERD 07/09/2007  . HYPERCHOLESTEROLEMIA 03/19/2007  . PSVT 03/19/2007  . ALLERGIC RHINITIS 03/19/2007  . ENDOMETRIAL POLYP 03/19/2007  . POSTMENOPAUSAL STATUS 03/19/2007  . OSTEOARTHRITIS 03/19/2007  . SPINAL STENOSIS 03/19/2007  . MURMUR 03/19/2007  . NEPHROLITHIASIS, HX OF 03/19/2007    Past Surgical History:  Procedure Laterality Date  . CHOLECYSTECTOMY    . COLONOSCOPY  11/08   internal hemorrhoids  . EP procedure  1994   SVT; normal echo  . ESOPHAGOGASTRODUODENOSCOPY  11/03   reactive gastropathy  . LIPOMA EXCISION  11/2002  . SVT s/p surgery--? ablation    . UPPER GASTROINTESTINAL ENDOSCOPY    . vaginal polyp excised  12/2000   benign    OB History    No data available       Home Medications    Prior to Admission medications   Medication Sig Start Date End Date Taking? Authorizing Provider  acetaminophen (TYLENOL) 500 MG tablet Take 500 mg by mouth every 6 (six) hours as needed.   Yes Historical Provider, MD  albuterol (PROVENTIL HFA;VENTOLIN HFA) 108 (90 Base) MCG/ACT inhaler Inhale 2 puffs into the lungs every 4 (four) hours as needed for wheezing or shortness of breath. Please give spacer if possible  06/09/16  Yes Abner Greenspan, MD  amLODipine (NORVASC) 5 MG tablet Take 1 tablet (5 mg total) by mouth daily. 10/07/16  Yes Abner Greenspan, MD  Calcium & Magnesium Carbonates (MYLANTA PO) Take 1 capsule by mouth as directed.     Yes Historical Provider, MD  esomeprazole (NEXIUM) 20 MG capsule Take 20 mg by mouth daily at 12 noon.   Yes Historical Provider, MD  fluticasone (FLONASE) 50 MCG/ACT nasal spray PLACE 2 SPRAYS INTO THE NOSE DAILY. 10/07/16  Yes Abner Greenspan, MD  folic acid (FOLVITE) 1 MG tablet Take 1 mg by mouth daily.   Yes Historical  Provider, MD  loratadine (CLARITIN) 10 MG tablet Take 10 mg by mouth daily.   Yes Historical Provider, MD  methotrexate (RHEUMATREX) 2.5 MG tablet Take 15 mg by mouth once a week. Caution:Chemotherapy. Protect from light. ( Monday of each week )   Yes Historical Provider, MD  polyethylene glycol (MIRALAX / GLYCOLAX) packet Take 17 g by mouth daily.   Yes Historical Provider, MD  prednisoLONE 5 MG TABS tablet Take 5 mg by mouth daily.   Yes Historical Provider, MD  ranitidine (ZANTAC) 150 MG capsule Take 1 capsule (150 mg total) by mouth 2 (two) times daily. 10/16/16  Yes Abner Greenspan, MD  traMADol (ULTRAM) 50 MG tablet Take 1 tablet (50 mg total) by mouth every 8 (eight) hours as needed. 01/08/17  Yes Scott Marcene Duos, MD  amoxicillin-clavulanate (AUGMENTIN) 875-125 MG tablet Take 1 tablet by mouth 2 (two) times daily. Patient not taking: Reported on 01/20/2017 10/28/16   Abner Greenspan, MD  EPINEPHrine (EPIPEN IJ) Inject as directed as needed. Reported on 12/05/2015    Historical Provider, MD  escitalopram (LEXAPRO) 20 MG tablet Take 1 tablet (20 mg total) by mouth daily. Patient not taking: Reported on 01/20/2017 06/24/16   Abner Greenspan, MD  guaiFENesin-codeine [facility]) 100-10 MG/5ML syrup Take 5 mLs by mouth 4 (four) times daily as needed for cough. With caution of sedation / do not take if working or driving Patient not taking: Reported on 01/20/2017 10/28/16   Abner Greenspan, MD  hydrocortisone (ANUSOL-HC) 25 MG suppository Place 1 suppository (25 mg total) rectally 2 (two) times daily. For 7 days 01/20/17   Charlesetta Shanks, MD  NEXIUM 40 MG capsule TAKE 1 CAPSULE BY MOUTH TWICE DAILY Patient not taking: Reported on 01/20/2017 08/14/14   Ladene Artist, MD  ondansetron (ZOFRAN-ODT) 4 MG disintegrating tablet TAKE 1 TABLET BY MOUTH EVERY 8 HOURS AS NEEDED FOR NAUSEA/VOMIT Patient not taking: Reported on 01/20/2017 01/07/16   Abner Greenspan, MD    Family History Family History  Problem Relation Age of  Onset  . Colon cancer Father 26  . Diabetes Mother   . Heart disease Mother   . Kidney disease Mother     ESRD  . Stroke Mother   . Hypertension Brother   . Breast cancer Sister   . Diabetes Other     nephews  . Colon cancer Sister   . Esophageal cancer Neg Hx   . Stomach cancer Neg Hx   . Rectal cancer Neg Hx     Social History Social History  Substance Use Topics  . Smoking status: Former Smoker    Quit date: 10/21/2003  . Smokeless tobacco: Never Used  . Alcohol use No     Allergies   Bee venom; Cetirizine hcl; and Hctz [hydrochlorothiazide]   Review of Systems Review of  Systems 10 Systems reviewed and are negative for acute change except as noted in the HPI.   Physical Exam Updated Vital Signs BP (!) 160/78 (BP Location: Left Arm)   Pulse 70   Temp 98.2 F (36.8 C) (Oral)   Resp 17   SpO2 96%   Physical Exam  Constitutional: She is oriented to person, place, and time. She appears well-developed and well-nourished. No distress.  HENT:  Head: Normocephalic and atraumatic.  Mouth/Throat: Oropharynx is clear and moist.  Eyes: Conjunctivae and EOM are normal.  Neck: Neck supple.  Cardiovascular: Normal rate and regular rhythm.   No murmur heard. Pulmonary/Chest: Effort normal and breath sounds normal. No respiratory distress.  Abdominal: Soft. Bowel sounds are normal. There is tenderness.  Moderate to severe diffuse lower abdominal pain to palpation.  Genitourinary:  Genitourinary Comments: One large external hemorrhoid tag nonthrombosed. Internal digital examination with soft, internal hemorrhoid. Blood-tinged mucus in the vault. No melena.  Musculoskeletal: Normal range of motion. She exhibits no edema or tenderness.  Neurological: She is alert and oriented to person, place, and time. No cranial nerve deficit. She exhibits normal muscle tone. Coordination normal.  Skin: Skin is warm and dry.  Psychiatric: She has a normal mood and affect.  Nursing note  and vitals reviewed.    ED Treatments / Results  Labs (all labs ordered are listed, but only abnormal results are displayed) Labs Reviewed  COMPREHENSIVE METABOLIC PANEL - Abnormal; Notable for the following:       Result Value   Glucose, Bld 152 (*)    All other components within normal limits  CBC - Abnormal; Notable for the following:    WBC 12.6 (*)    All other components within normal limits  POC OCCULT BLOOD, ED  TYPE AND SCREEN  ABO/RH    EKG  EKG Interpretation None       Radiology Ct Abdomen Pelvis W Contrast  Result Date: 01/20/2017 CLINICAL DATA:  Lower abdomen pain, large amount of blood in stool today and EXAM: CT ABDOMEN AND PELVIS WITH CONTRAST TECHNIQUE: Multidetector CT imaging of the abdomen and pelvis was performed using the standard protocol following bolus administration of intravenous contrast. CONTRAST:  124mL ISOVUE-300 IOPAMIDOL (ISOVUE-300) INJECTION 61% COMPARISON:  CT abdomen pelvis of 11/02/2015 FINDINGS: Lower chest: The lung bases clear. The heart is within normal limits in size. Hepatobiliary: The liver remains low in attenuation consistent with fatty infiltration. Surgical clips are noted from prior cholecystectomy. Pancreas: The pancreas is normal in size and the pancreatic duct is not dilated. Spleen: The spleen is normal in size. Adrenals/Urinary Tract: The adrenal glands are normal. The kidneys enhance with no calculus or mass and no hydronephrosis is seen. On delayed images, the pelvocaliceal systems are unremarkable. The ureters are normal in caliber. The urinary bladder is unremarkable. Stomach/Bowel: The stomach is decompressed. No small bowel distention is seen. There are a few diverticula within the rectosigmoid colon. No colonic lesion is seen to explain the bright red blood per rectum. There is feces throughout the colon. The terminal ileum and the appendix are somewhat high in position but normal by CT. Vascular/Lymphatic: The abdominal  aorta is normal in caliber with moderate abdominal aortic atherosclerosis present. No adenopathy is seen. Reproductive: The uterus is normal in size and calcified uterine fibroids are noted. No adnexal lesion is seen. No free fluid is noted within the pelvis. Other: None. Musculoskeletal: The lumbar vertebrae are in normal alignment with degenerative disc disease primarily at L5-S1,  with degenerative changes in the facet joints of the lower lumbar spine as well. IMPRESSION: 1. No explanation for the blood per rectum is seen other than a few diverticula within the rectosigmoid colon. No mass is evident by CT. 2. Suspect fatty infiltration of the liver.  Correlate with LFTs. 3. Moderate abdominal aortic atherosclerosis. 4. Calcified uterine fibroids. Electronically Signed   By: Ivar Drape M.D.   On: 01/20/2017 15:16    Procedures Procedures (including critical care time)  Medications Ordered in ED Medications  0.9 %  sodium chloride infusion ( Intravenous Stopped 01/20/17 1629)  morphine 4 MG/ML injection 4 mg (4 mg Intravenous Given 01/20/17 1324)  iopamidol (ISOVUE-300) 61 % injection (100 mLs  Contrast Given 01/20/17 1449)     Initial Impression / Assessment and Plan / ED Course  I have reviewed the triage vital signs and the nursing notes.  Pertinent labs & imaging results that were available during my care of the patient were reviewed by me and considered in my medical decision making (see chart for details).     Final Clinical Impressions(s) / ED Diagnoses   Final diagnoses:  Gastrointestinal hemorrhage associated with anorectal source  Diverticulosis of large intestine with hemorrhage  Patient does have identified diverticulosis without diverticulitis. It is possible rectal bleeding is from diverticulosis however I more suspect internal hemorrhoid. Patient has a large nonthrombosed external hemorrhoid and likely a palpable internal hemorrhoid. Digital rectal exam has blood-tinged mucus  present. Patient will be placed on Anusol and is advised for close follow-up with GI, she has been seen by after [facility] at [place]. She reports last colonoscopy about 6 months ago. Patient has no anemia stable vital signs. She is counseled on signs and symptoms for which return.  New Prescriptions New Prescriptions   HYDROCORTISONE (ANUSOL-HC) 25 MG SUPPOSITORY    Place 1 suppository (25 mg total) rectally 2 (two) times daily. For 7 days     Charlesetta Shanks, MD 01/20/17 1655

## 2017-01-20 NOTE — ED Notes (Signed)
Patients husband is helping her undress for exam

## 2017-01-20 NOTE — Discharge Instructions (Signed)
Return to the emergency department if you develop fever, worsening pain or bleeding that is increasing

## 2017-01-21 ENCOUNTER — Telehealth: Payer: Self-pay

## 2017-01-21 ENCOUNTER — Inpatient Hospital Stay (HOSPITAL_COMMUNITY)
Admission: EM | Admit: 2017-01-21 | Discharge: 2017-01-26 | DRG: 378 | Disposition: A | Discharge status: Home / Self Care | Payer: Medicare Other | Attending: Internal Medicine | Admitting: Internal Medicine

## 2017-01-21 ENCOUNTER — Encounter (HOSPITAL_COMMUNITY): Payer: Self-pay | Admitting: *Deleted

## 2017-01-21 DIAGNOSIS — K219 Gastro-esophageal reflux disease without esophagitis: Secondary | ICD-10-CM | POA: Diagnosis not present

## 2017-01-21 DIAGNOSIS — Z9109 Other allergy status, other than to drugs and biological substances: Secondary | ICD-10-CM

## 2017-01-21 DIAGNOSIS — Z7952 Long term (current) use of systemic steroids: Secondary | ICD-10-CM

## 2017-01-21 DIAGNOSIS — K644 Residual hemorrhoidal skin tags: Secondary | ICD-10-CM | POA: Diagnosis present

## 2017-01-21 DIAGNOSIS — R1032 Left lower quadrant pain: Secondary | ICD-10-CM | POA: Diagnosis not present

## 2017-01-21 DIAGNOSIS — Z87891 Personal history of nicotine dependence: Secondary | ICD-10-CM

## 2017-01-21 DIAGNOSIS — K559 Vascular disorder of intestine, unspecified: Secondary | ICD-10-CM | POA: Diagnosis present

## 2017-01-21 DIAGNOSIS — K760 Fatty (change of) liver, not elsewhere classified: Secondary | ICD-10-CM | POA: Diagnosis present

## 2017-01-21 DIAGNOSIS — T380X5A Adverse effect of glucocorticoids and synthetic analogues, initial encounter: Secondary | ICD-10-CM | POA: Diagnosis present

## 2017-01-21 DIAGNOSIS — R1030 Lower abdominal pain, unspecified: Secondary | ICD-10-CM | POA: Diagnosis not present

## 2017-01-21 DIAGNOSIS — I10 Essential (primary) hypertension: Secondary | ICD-10-CM | POA: Diagnosis present

## 2017-01-21 DIAGNOSIS — R000 Tachycardia, unspecified: Secondary | ICD-10-CM | POA: Diagnosis not present

## 2017-01-21 DIAGNOSIS — E785 Hyperlipidemia, unspecified: Secondary | ICD-10-CM | POA: Diagnosis not present

## 2017-01-21 DIAGNOSIS — E861 Hypovolemia: Secondary | ICD-10-CM | POA: Diagnosis not present

## 2017-01-21 DIAGNOSIS — M069 Rheumatoid arthritis, unspecified: Secondary | ICD-10-CM | POA: Diagnosis present

## 2017-01-21 DIAGNOSIS — K5733 Diverticulitis of large intestine without perforation or abscess with bleeding: Secondary | ICD-10-CM | POA: Diagnosis not present

## 2017-01-21 DIAGNOSIS — F4323 Adjustment disorder with mixed anxiety and depressed mood: Secondary | ICD-10-CM | POA: Diagnosis present

## 2017-01-21 DIAGNOSIS — I2510 Atherosclerotic heart disease of native coronary artery without angina pectoris: Secondary | ICD-10-CM | POA: Diagnosis not present

## 2017-01-21 DIAGNOSIS — K921 Melena: Secondary | ICD-10-CM

## 2017-01-21 DIAGNOSIS — K589 Irritable bowel syndrome without diarrhea: Secondary | ICD-10-CM | POA: Diagnosis not present

## 2017-01-21 DIAGNOSIS — E669 Obesity, unspecified: Secondary | ICD-10-CM | POA: Diagnosis present

## 2017-01-21 DIAGNOSIS — M1990 Unspecified osteoarthritis, unspecified site: Secondary | ICD-10-CM | POA: Diagnosis present

## 2017-01-21 DIAGNOSIS — Z79899 Other long term (current) drug therapy: Secondary | ICD-10-CM

## 2017-01-21 DIAGNOSIS — K625 Hemorrhage of anus and rectum: Secondary | ICD-10-CM | POA: Diagnosis present

## 2017-01-21 DIAGNOSIS — R0602 Shortness of breath: Secondary | ICD-10-CM | POA: Diagnosis not present

## 2017-01-21 DIAGNOSIS — R739 Hyperglycemia, unspecified: Secondary | ICD-10-CM | POA: Diagnosis not present

## 2017-01-21 DIAGNOSIS — Z86010 Personal history of colonic polyps: Secondary | ICD-10-CM

## 2017-01-21 DIAGNOSIS — K922 Gastrointestinal hemorrhage, unspecified: Secondary | ICD-10-CM | POA: Diagnosis not present

## 2017-01-21 DIAGNOSIS — R0682 Tachypnea, not elsewhere classified: Secondary | ICD-10-CM | POA: Diagnosis present

## 2017-01-21 DIAGNOSIS — K5731 Diverticulosis of large intestine without perforation or abscess with bleeding: Secondary | ICD-10-CM | POA: Diagnosis not present

## 2017-01-21 DIAGNOSIS — Z6831 Body mass index (BMI) 31.0-31.9, adult: Secondary | ICD-10-CM

## 2017-01-21 DIAGNOSIS — D62 Acute posthemorrhagic anemia: Secondary | ICD-10-CM | POA: Diagnosis not present

## 2017-01-21 DIAGNOSIS — E118 Type 2 diabetes mellitus with unspecified complications: Secondary | ICD-10-CM

## 2017-01-21 DIAGNOSIS — E1165 Type 2 diabetes mellitus with hyperglycemia: Secondary | ICD-10-CM | POA: Diagnosis not present

## 2017-01-21 DIAGNOSIS — R7303 Prediabetes: Secondary | ICD-10-CM | POA: Insufficient documentation

## 2017-01-21 HISTORY — DX: Hyperglycemia, unspecified: R73.9

## 2017-01-21 HISTORY — DX: Type 2 diabetes mellitus with unspecified complications: E11.8

## 2017-01-21 HISTORY — DX: Leiomyoma of uterus, unspecified: D25.9

## 2017-01-21 HISTORY — DX: Fatty (change of) liver, not elsewhere classified: K76.0

## 2017-01-21 LAB — TYPE AND SCREEN
ABO/RH(D): O POS
ABO/RH(D): O POS
Antibody Screen: NEGATIVE
Antibody Screen: NEGATIVE

## 2017-01-21 LAB — COMPREHENSIVE METABOLIC PANEL
ALBUMIN: 3.2 g/dL — AB (ref 3.5–5.0)
ALK PHOS: 60 U/L (ref 38–126)
ALT: 16 U/L (ref 14–54)
AST: 25 U/L (ref 15–41)
Anion gap: 8 (ref 5–15)
BUN: 17 mg/dL (ref 6–20)
CALCIUM: 8.8 mg/dL — AB (ref 8.9–10.3)
CHLORIDE: 106 mmol/L (ref 101–111)
CO2: 24 mmol/L (ref 22–32)
Creatinine, Ser: 0.85 mg/dL (ref 0.44–1.00)
GFR calc Af Amer: >60 mL/min (ref >60)
GFR calc non Af Amer: >60 mL/min (ref >60)
GLUCOSE: 195 mg/dL — AB (ref 65–99)
Potassium: 3.9 mmol/L (ref 3.5–5.1)
SODIUM: 138 mmol/L (ref 135–145)
Total Bilirubin: 0.4 mg/dL (ref 0.3–1.2)
Total Protein: 6.5 g/dL (ref 6.5–8.1)

## 2017-01-21 LAB — URINALYSIS, ROUTINE W REFLEX MICROSCOPIC
Bilirubin Urine: NEGATIVE
GLUCOSE, UA: NEGATIVE mg/dL
Hgb urine dipstick: NEGATIVE
Ketones, ur: NEGATIVE mg/dL
LEUKOCYTES UA: NEGATIVE
Nitrite: NEGATIVE
Protein, ur: NEGATIVE mg/dL
Specific Gravity, Urine: 1.031 — ABNORMAL HIGH (ref 1.005–1.030)
pH: 5 (ref 5.0–8.0)

## 2017-01-21 LAB — TSH: TSH: 0.685 u[IU]/mL (ref 0.350–4.500)

## 2017-01-21 LAB — GLUCOSE, CAPILLARY
Glucose-Capillary: 113 mg/dL — ABNORMAL HIGH (ref 65–99)
Glucose-Capillary: 138 mg/dL — ABNORMAL HIGH (ref 65–99)

## 2017-01-21 LAB — CBC
HCT: 33.7 % — ABNORMAL LOW (ref 36.0–46.0)
HEMATOCRIT: 26.5 % — AB (ref 36.0–46.0)
Hemoglobin: 11 g/dL — ABNORMAL LOW (ref 12.0–15.0)
Hemoglobin: 8.7 g/dL — ABNORMAL LOW (ref 12.0–15.0)
MCH: 28.1 pg (ref 26.0–34.0)
MCH: 28.2 pg (ref 26.0–34.0)
MCHC: 32.6 g/dL (ref 30.0–36.0)
MCHC: 32.8 g/dL (ref 30.0–36.0)
MCV: 86 fL (ref 78.0–100.0)
MCV: 85.8 fL (ref 78.0–100.0)
PLATELETS: 386 10*3/uL (ref 150–400)
Platelets: 276 10*3/uL (ref 150–400)
RBC: 3.92 MIL/uL (ref 3.87–5.11)
RBC: 3.09 MIL/uL — AB (ref 3.87–5.11)
RDW: 14.7 % (ref 11.5–15.5)
RDW: 14.7 % (ref 11.5–15.5)
WBC: 13.8 10*3/uL — ABNORMAL HIGH (ref 4.0–10.5)
WBC: 10.4 10*3/uL (ref 4.0–10.5)

## 2017-01-21 LAB — POC OCCULT BLOOD, ED: FECAL OCCULT BLD: POSITIVE — AB

## 2017-01-21 LAB — CBG MONITORING, ED: GLUCOSE-CAPILLARY: 145 mg/dL — AB (ref 65–99)

## 2017-01-21 MED ORDER — ALBUTEROL SULFATE (2.5 MG/3ML) 0.083% IN NEBU: 3.0000 mL | INHALATION_SOLUTION | RESPIRATORY_TRACT | Status: DC | PRN

## 2017-01-21 MED ORDER — HYDROCODONE-ACETAMINOPHEN 5-325 MG PO TABS
1.0000 | ORAL_TABLET | ORAL | Status: DC | PRN
  Administered 2017-01-21: 1 via ORAL
  Filled 2017-01-21: qty 1

## 2017-01-21 MED ORDER — INSULIN ASPART 100 UNIT/ML ~~LOC~~ SOLN: 0.0000 [IU] | Freq: Every day | SUBCUTANEOUS | Status: DC

## 2017-01-21 MED ORDER — ONDANSETRON HCL 4 MG PO TABS: 4.0000 mg | ORAL_TABLET | Freq: Four times a day (QID) | ORAL | Status: DC | PRN

## 2017-01-21 MED ORDER — LORATADINE 10 MG PO TABS
10.0000 mg | ORAL_TABLET | Freq: Every day | ORAL | Status: DC
  Administered 2017-01-21 – 2017-01-26 (×6): 10 mg via ORAL
  Filled 2017-01-21 (×6): qty 1

## 2017-01-21 MED ORDER — SODIUM CHLORIDE 0.9 % IV SOLN
INTRAVENOUS | Status: AC
  Administered 2017-01-21: 08:00:00 via INTRAVENOUS

## 2017-01-21 MED ORDER — HYDROCORTISONE ACETATE 25 MG RE SUPP
25.0000 mg | Freq: Two times a day (BID) | RECTAL | Status: DC
  Administered 2017-01-21 – 2017-01-23 (×3): 25 mg via RECTAL
  Filled 2017-01-21 (×6): qty 1

## 2017-01-21 MED ORDER — PANTOPRAZOLE SODIUM 40 MG PO TBEC
40.0000 mg | DELAYED_RELEASE_TABLET | Freq: Every day | ORAL | Status: DC
Start: 2017-01-21 — End: 2017-01-26
  Administered 2017-01-21 – 2017-01-26 (×6): 40 mg via ORAL
  Filled 2017-01-21 (×6): qty 1

## 2017-01-21 MED ORDER — AMLODIPINE BESYLATE 5 MG PO TABS
5.0000 mg | ORAL_TABLET | Freq: Every day | ORAL | Status: DC
  Administered 2017-01-22 – 2017-01-26 (×5): 5 mg via ORAL
  Filled 2017-01-21 (×5): qty 1

## 2017-01-21 MED ORDER — TRAMADOL HCL 50 MG PO TABS
50.0000 mg | ORAL_TABLET | Freq: Four times a day (QID) | ORAL | Status: DC | PRN
  Administered 2017-01-21 – 2017-01-25 (×5): 50 mg via ORAL
  Filled 2017-01-21 (×7): qty 1

## 2017-01-21 MED ORDER — POLYETHYLENE GLYCOL 3350 17 G PO PACK
17.0000 g | PACK | Freq: Every day | ORAL | Status: DC
  Administered 2017-01-21 – 2017-01-22 (×2): 17 g via ORAL
  Filled 2017-01-21 (×2): qty 1

## 2017-01-21 MED ORDER — PREDNISOLONE 5 MG PO TABS: 5.0000 mg | ORAL_TABLET | Freq: Every day | ORAL | Status: DC

## 2017-01-21 MED ORDER — FAMOTIDINE 20 MG PO TABS
10.0000 mg | ORAL_TABLET | Freq: Every day | ORAL | Status: DC
  Administered 2017-01-21 – 2017-01-26 (×6): 10 mg via ORAL
  Filled 2017-01-21 (×6): qty 1

## 2017-01-21 MED ORDER — ACETAMINOPHEN 500 MG PO TABS: 500.0000 mg | ORAL_TABLET | Freq: Four times a day (QID) | ORAL | Status: DC | PRN

## 2017-01-21 MED ORDER — BOOST / RESOURCE BREEZE PO LIQD
1.0000 | Freq: Three times a day (TID) | ORAL | Status: DC
  Administered 2017-01-21 – 2017-01-26 (×12): 1 via ORAL

## 2017-01-21 MED ORDER — SODIUM CHLORIDE 0.9 % IV BOLUS (SEPSIS)
1000.0000 mL | Freq: Once | INTRAVENOUS | Status: AC
  Administered 2017-01-21: 1000 mL via INTRAVENOUS

## 2017-01-21 MED ORDER — ONDANSETRON HCL 4 MG/2ML IJ SOLN: 4.0000 mg | Freq: Four times a day (QID) | INTRAMUSCULAR | Status: DC | PRN

## 2017-01-21 MED ORDER — INSULIN ASPART 100 UNIT/ML ~~LOC~~ SOLN
0.0000 [IU] | Freq: Three times a day (TID) | SUBCUTANEOUS | Status: DC
  Administered 2017-01-22 – 2017-01-23 (×3): 2 [IU] via SUBCUTANEOUS
  Administered 2017-01-23: 1 [IU] via SUBCUTANEOUS
  Administered 2017-01-24: 3 [IU] via SUBCUTANEOUS
  Administered 2017-01-24 (×2): 2 [IU] via SUBCUTANEOUS
  Administered 2017-01-25: 3 [IU] via SUBCUTANEOUS

## 2017-01-21 MED ORDER — PREDNISONE 5 MG PO TABS
5.0000 mg | ORAL_TABLET | Freq: Every day | ORAL | Status: DC
  Administered 2017-01-21 – 2017-01-26 (×6): 5 mg via ORAL
  Filled 2017-01-21 (×6): qty 1

## 2017-01-21 MED ORDER — SODIUM CHLORIDE 0.9% FLUSH
3.0000 mL | Freq: Two times a day (BID) | INTRAVENOUS | Status: DC
  Administered 2017-01-22 – 2017-01-26 (×7): 3 mL via INTRAVENOUS

## 2017-01-21 MED ORDER — FLUTICASONE PROPIONATE 50 MCG/ACT NA SUSP
1.0000 | Freq: Every day | NASAL | Status: DC
  Administered 2017-01-21 – 2017-01-26 (×5): 1 via NASAL
  Filled 2017-01-21: qty 16

## 2017-01-21 MED ORDER — FOLIC ACID 1 MG PO TABS
1.0000 mg | ORAL_TABLET | Freq: Every day | ORAL | Status: DC
  Administered 2017-01-21 – 2017-01-26 (×6): 1 mg via ORAL
  Filled 2017-01-21 (×6): qty 1

## 2017-01-21 NOTE — ED Notes (Signed)
Helped pt to bathroom in wheel chair

## 2017-01-21 NOTE — ED Provider Notes (Signed)
[facility] DEPT Provider Note   CSN: [id number] Arrival date & time: 01/21/17  0439     History   Chief Complaint Chief Complaint  Patient presents with  . Rectal Bleeding    HPI [name] is a 76 y.o. female.  The history is provided by the patient.  Rectal Bleeding  Quality:  Bright red Amount:  Moderate Timing:  Intermittent Chronicity:  New Relieved by:  None tried Worsened by:  Nothing Associated symptoms: abdominal pain, dizziness and light-headedness   Associated symptoms: no fever, no hematemesis and no vomiting   Risk factors: no anticoagulant use    Patient presents for recurrent rectal bleeding She reports she had 2 bloody bowel movements earlier tonight She now feels weak/dizzy No LOC No vomiting She is not on anticoagulants She was seen on 4/3 for rectal bleeding but improved and went home Past Medical History:  Diagnosis Date  . Allergic rhinitis   . Anxiety   . Cataract 2015   bilateral  . Diverticulosis   . Esophageal reflux   . Essential hypertension 01/07/2016  . Fibula fracture 02/2005  . Former smoker   . Gastritis   . Heart murmur   . Hemorrhoids   . History of nephrolithiasis   . Hyperlipemia   . Osteoarthritis   . Personal history of colonic polyps 1998   hyperplastic  . RA (rheumatoid arthritis) ([place])   . Shingles 2012    Patient Active Problem List   Diagnosis Date Noted  . Acute sinusitis 10/28/2016  . Estrogen deficiency 10/07/2016  . Routine general medical examination at a health care facility 10/07/2016  . Adjustment disorder with mixed anxiety and depressed mood 06/24/2016  . IBS (irritable bowel syndrome) 06/09/2016  . Hyperglycemia 02/06/2016  . Essential hypertension 01/07/2016  . Gastritis 11/07/2015  . Fatty liver 11/07/2015  . Coronary atherosclerosis 11/07/2015  . Left shoulder pain 12/25/2014  . Left-sided chest wall pain 12/25/2014  . Fatigue 04/24/2014  . Obesity 04/24/2014  . Encounter for  Medicare annual wellness exam 04/24/2014  . Post herpetic neuralgia 07/09/2011  . Low back pain 07/09/2011  . Left sided abdominal pain 06/12/2011  . Rheumatoid arthritis ([place]) 07/23/2007  . GERD 07/09/2007  . HYPERCHOLESTEROLEMIA 03/19/2007  . PSVT 03/19/2007  . ALLERGIC RHINITIS 03/19/2007  . ENDOMETRIAL POLYP 03/19/2007  . POSTMENOPAUSAL STATUS 03/19/2007  . OSTEOARTHRITIS 03/19/2007  . SPINAL STENOSIS 03/19/2007  . MURMUR 03/19/2007  . NEPHROLITHIASIS, HX OF 03/19/2007    Past Surgical History:  Procedure Laterality Date  . CHOLECYSTECTOMY    . COLONOSCOPY  11/08   internal hemorrhoids  . EP procedure  1994   SVT; normal echo  . ESOPHAGOGASTRODUODENOSCOPY  11/03   reactive gastropathy  . LIPOMA EXCISION  11/2002  . SVT s/p surgery--? ablation    . UPPER GASTROINTESTINAL ENDOSCOPY    . vaginal polyp excised  12/2000   benign    OB History    No data available       Home Medications    Prior to Admission medications   Medication Sig Start Date End Date Taking? Authorizing Provider  acetaminophen (TYLENOL) 500 MG tablet Take 500 mg by mouth every 6 (six) hours as needed.    Historical Provider, MD  albuterol (PROVENTIL HFA;VENTOLIN HFA) 108 (90 Base) MCG/ACT inhaler Inhale 2 puffs into the lungs every 4 (four) hours as needed for wheezing or shortness of breath. Please give spacer if possible 06/09/16   Abner Greenspan, MD  amLODipine [facility])  5 MG tablet Take 1 tablet (5 mg total) by mouth daily. 10/07/16   Abner Greenspan, MD  amoxicillin-clavulanate (AUGMENTIN) 875-125 MG tablet Take 1 tablet by mouth 2 (two) times daily. Patient not taking: Reported on 01/20/2017 10/28/16   Abner Greenspan, MD  Calcium & Magnesium Carbonates (MYLANTA PO) Take 1 capsule by mouth as directed.      Historical Provider, MD  EPINEPHrine (EPIPEN IJ) Inject as directed as needed. Reported on 12/05/2015    Historical Provider, MD  escitalopram (LEXAPRO) 20 MG tablet Take 1 tablet (20 mg total) by  mouth daily. Patient not taking: Reported on 01/20/2017 06/24/16   Abner Greenspan, MD  esomeprazole (NEXIUM) 20 MG capsule Take 20 mg by mouth daily at 12 noon.    Historical Provider, MD  fluticasone (FLONASE) 50 MCG/ACT nasal spray PLACE 2 SPRAYS INTO THE NOSE DAILY. 10/07/16   Abner Greenspan, MD  folic acid (FOLVITE) 1 MG tablet Take 1 mg by mouth daily.    Historical Provider, MD  guaiFENesin-codeine (ROBITUSSIN AC) 100-10 MG/5ML syrup Take 5 mLs by mouth 4 (four) times daily as needed for cough. With caution of sedation / do not take if working or driving Patient not taking: Reported on 01/20/2017 10/28/16   Abner Greenspan, MD  hydrocortisone (ANUSOL-HC) 25 MG suppository Place 1 suppository (25 mg total) rectally 2 (two) times daily. For 7 days 01/20/17   Charlesetta Shanks, MD  loratadine (CLARITIN) 10 MG tablet Take 10 mg by mouth daily.    Historical Provider, MD  methotrexate (RHEUMATREX) 2.5 MG tablet Take 15 mg by mouth once a week. Caution:Chemotherapy. Protect from light. ( Monday of each week )    Historical Provider, MD  NEXIUM 40 MG capsule TAKE 1 CAPSULE BY MOUTH TWICE DAILY Patient not taking: Reported on 01/20/2017 08/14/14   Ladene Artist, MD  ondansetron (ZOFRAN-ODT) 4 MG disintegrating tablet TAKE 1 TABLET BY MOUTH EVERY 8 HOURS AS NEEDED FOR NAUSEA/VOMIT Patient not taking: Reported on 01/20/2017 01/07/16   Abner Greenspan, MD  polyethylene glycol (MIRALAX / GLYCOLAX) packet Take 17 g by mouth daily.    Historical Provider, MD  prednisoLONE 5 MG TABS tablet Take 5 mg by mouth daily.    Historical Provider, MD  ranitidine (ZANTAC) 150 MG capsule Take 1 capsule (150 mg total) by mouth 2 (two) times daily. 10/16/16   Abner Greenspan, MD  traMADol (ULTRAM) 50 MG tablet Take 1 tablet (50 mg total) by mouth every 8 (eight) hours as needed. 01/08/17   Meredith Pel, MD    Family History Family History  Problem Relation Age of Onset  . Colon cancer Father 48  . Diabetes Mother   . Heart disease  Mother   . Kidney disease Mother     ESRD  . Stroke Mother   . Hypertension Brother   . Breast cancer Sister   . Diabetes Other     nephews  . Colon cancer Sister   . Esophageal cancer Neg Hx   . Stomach cancer Neg Hx   . Rectal cancer Neg Hx     Social History Social History  Substance Use Topics  . Smoking status: Former Smoker    Quit date: 10/21/2003  . Smokeless tobacco: Never Used  . Alcohol use No     Allergies   Bee venom; Cetirizine hcl; and Hctz [hydrochlorothiazide]   Review of Systems Review of Systems  Constitutional: Negative for fever.  Respiratory: Positive for  shortness of breath.   Gastrointestinal: Positive for abdominal pain, hematochezia and nausea. Negative for hematemesis and vomiting.  Genitourinary: Negative for hematuria and vaginal bleeding.  Neurological: Positive for dizziness and light-headedness.  All other systems reviewed and are negative.    Physical Exam Updated Vital Signs BP 123/74   Pulse 97   Temp 98.1 F (36.7 C)   Resp 20   SpO2 93%   Physical Exam CONSTITUTIONAL: ill appearing HEAD: Normocephalic/atraumatic EYES: EOMI/PERRL, conjunctiva pale ENMT: Mucous membranes moist NECK: supple no meningeal signs SPINE/BACK:entire spine nontender CV: S1/S2 noted, murmur noted LUNGS: Lungs are clear to auscultation bilaterally, no apparent distress ABDOMEN: soft, mild diffuse tenderness, no rebound or guarding, bowel sounds noted throughout abdomen GU:no cva tenderness Rectal - red stool noted.  External hemorrhoids.  Female chaperone present NEURO: Pt is awake/alert/appropriate, moves all extremitiesx4.  No facial droop.   EXTREMITIES: pulses normal/equal, full ROM SKIN: warm, color normal PSYCH: mildly anxious  ED Treatments / Results  Labs (all labs ordered are listed, but only abnormal results are displayed) Labs Reviewed  COMPREHENSIVE METABOLIC PANEL - Abnormal; Notable for the following:       Result Value    Glucose, Bld 195 (*)    Calcium 8.8 (*)    Albumin 3.2 (*)    All other components within normal limits  CBC - Abnormal; Notable for the following:    WBC 13.8 (*)    Hemoglobin 11.0 (*)    HCT 33.7 (*)    All other components within normal limits  POC OCCULT BLOOD, ED - Abnormal; Notable for the following:    Fecal Occult Bld POSITIVE (*)    All other components within normal limits  TYPE AND SCREEN    EKG  EKG Interpretation  Date/Time:  Wednesday January 21 2017 04:43:08 EDT Ventricular Rate:  115 PR Interval:  142 QRS Duration: 78 QT Interval:  328 QTC Calculation: 453 R Axis:   72 Text Interpretation:  Sinus tachycardia with Premature supraventricular complexes Nonspecific ST abnormality Abnormal ECG Confirmed by Christy Gentles  MD, Elenore Rota ([id number]) on 01/21/2017 5:35:01 AM       Radiology Ct Abdomen Pelvis W Contrast  Result Date: 01/20/2017 CLINICAL DATA:  Lower abdomen pain, large amount of blood in stool today and EXAM: CT ABDOMEN AND PELVIS WITH CONTRAST TECHNIQUE: Multidetector CT imaging of the abdomen and pelvis was performed using the standard protocol following bolus administration of intravenous contrast. CONTRAST:  135mL ISOVUE-300 IOPAMIDOL (ISOVUE-300) INJECTION 61% COMPARISON:  CT abdomen pelvis of 11/02/2015 FINDINGS: Lower chest: The lung bases clear. The heart is within normal limits in size. Hepatobiliary: The liver remains low in attenuation consistent with fatty infiltration. Surgical clips are noted from prior cholecystectomy. Pancreas: The pancreas is normal in size and the pancreatic duct is not dilated. Spleen: The spleen is normal in size. Adrenals/Urinary Tract: The adrenal glands are normal. The kidneys enhance with no calculus or mass and no hydronephrosis is seen. On delayed images, the pelvocaliceal systems are unremarkable. The ureters are normal in caliber. The urinary bladder is unremarkable. Stomach/Bowel: The stomach is decompressed. No small bowel  distention is seen. There are a few diverticula within the rectosigmoid colon. No colonic lesion is seen to explain the bright red blood per rectum. There is feces throughout the colon. The terminal ileum and the appendix are somewhat high in position but normal by CT. Vascular/Lymphatic: The abdominal aorta is normal in caliber with moderate abdominal aortic atherosclerosis present. No adenopathy is seen.  Reproductive: The uterus is normal in size and calcified uterine fibroids are noted. No adnexal lesion is seen. No free fluid is noted within the pelvis. Other: None. Musculoskeletal: The lumbar vertebrae are in normal alignment with degenerative disc disease primarily at L5-S1, with degenerative changes in the facet joints of the lower lumbar spine as well. IMPRESSION: 1. No explanation for the blood per rectum is seen other than a few diverticula within the rectosigmoid colon. No mass is evident by CT. 2. Suspect fatty infiltration of the liver.  Correlate with LFTs. 3. Moderate abdominal aortic atherosclerosis. 4. Calcified uterine fibroids. Electronically Signed   By: Ivar Drape M.D.   On: 01/20/2017 15:16    Procedures Procedures (including critical care time)  Medications Ordered in ED Medications  sodium chloride 0.9 % bolus 1,000 mL (1,000 mLs Intravenous New Bag/Given 01/21/17 0535)     Initial Impression / Assessment and Plan / ED Course  I have reviewed the triage vital signs and the nursing notes.  Pertinent labs & results that were available during my care of the patient were reviewed by me and considered in my medical decision making (see chart for details).     5:39 AM Pt seen yesterday for rectal bleeding/abd pain She had CT scan that revealed diverticulosis but no other acute findings She felt improved and went home She now returns with 2 episodes of bloody stool She does not have any objection to blood products if necessary 7:19 AM Pt stabilized in the ED She did have  drop in her HGB since ER visit yesterday I feel admission/monitoring is warranted at this time Pt agreeable  D/w triad for admission  Final Clinical Impressions(s) / ED Diagnoses   Final diagnoses:  Rectal bleeding    New Prescriptions New Prescriptions   No medications on file     Ripley Fraise, MD 01/21/17 [id number]

## 2017-01-21 NOTE — Care Management Note (Signed)
Case Management Note  Patient Details  Name: [name] MRN: [id number] Date of Birth: 01/20/1941  Subjective/Objective:                  From home with spouse. /75 y.o. female with medical history significant  hypertension, hyperlipidemia, anxiety, arthritis, presents to the emergency department with chief complaint of bright red blood per rectum and persistent abdominal pain. Initial evaluation reveals drop in her hemoglobin.  Action/Plan: Admit status OBSERVATION (RECTAL BLEED); anticipate discharge [place].   Expected Discharge Date:   (unsure)               Expected Discharge Plan:  Home/Self Care  In-House Referral:  NA  Discharge planning Services  CM Consult  Post Acute Care Choice:    Choice offered to:     DME Arranged:    DME Agency:     HH Arranged:    HH Agency:     Status of Service:  In process, will continue to follow  If discussed at Long Length of Stay Meetings, dates discussed:    Additional Comments:  Fuller Mandril, RN 01/21/2017, 9:53 AM

## 2017-01-21 NOTE — Telephone Encounter (Signed)
PLEASE NOTE: All timestamps contained within this report are represented as [place] Standard Time. CONFIDENTIALTY NOTICE: This fax transmission is intended only for the addressee. It contains information that is legally privileged, confidential or otherwise protected from use or disclosure. If you are not the intended recipient, you are strictly prohibited from reviewing, disclosing, copying using or disseminating any of this information or taking any action in reliance on or regarding this information. If you have received this fax in error, please notify us immediately by telephone so that we can arrange for its return to [place]. Phone: [phone], Toll-Free: [phone], Fax: [phone] Page: 1 of 2 Call Id: [id number] [place] Patient Name: [name] 65 Gender: Female DOB: 09-29-1941 Age: 76 Y 55 M 19 D Return Phone Number: [phone] (Primary), [phone] (Secondary) City/State/Zip: [place] Client [place] Night - Client Client Site [facility], Roque Lias - MD Who Is Calling Patient / Member / Family / Caregiver Call Type Triage / Clinical Caller Name Charlie Relationship To Patient Spouse Return Phone Number [phone] (Primary) Chief Complaint Blood In Stool Reason for Call Symptomatic / Request for [name] states his wife [name] was seen at the ER for blood in her stool. She was told she has hemorrhoids. She is still passing dark blood. Nurse Assessment Nurse: Jimmye Norman, RN, Museum/gallery conservator Date/Time (Eastern Time): 01/21/2017 3:34:05 AM Confirm and document reason for call. If symptomatic, describe symptoms. ---Caller states that his wife was seen in the ER Yesterday for blood in her stool. She is still passing dark blood. She has hemorrhoids Does the PT have any chronic conditions? (i.e.  diabetes, asthma, etc.) ---Yes List chronic conditions. ---arthritis Guidelines Guideline Title Affirmed Question Rectal Bleeding Severe dizziness (e.g., unable to stand, requires support to walk, feels like passing out now) Disp. Time Eilene Ghazi Time) Disposition Final User 01/21/2017 3:37:35 AM Go to ED Now Yes Jimmye Norman, RN, Amber Referrals [facility] - ED Care Advice Given Per Guideline GO TO ED NOW: You need to be seen in the Emergency Department. Go to the ER at ___________ [place] now. Drive carefully. DRIVING: Another adult should drive. Do not delay going to the Emergency Department. If immediate transportation is not available via car or taxi, then the patient should be instructed to call [id number]. BRING MEDICINES: * Please bring a list of your current medicines when you go to the Emergency Department (ER). CARE ADVICE given per Rectal Bleeding (Adult) guideline. Comments User: Genene Churn, RN Date/Time (Eastern Time): 01/21/2017 3:36:44 AM She is feeling weak but is able to walk PLEASE NOTE: All timestamps contained within this report are represented as [place] Standard Time. CONFIDENTIALTY NOTICE: This fax transmission is intended only for the addressee. It contains information that is legally privileged, confidential or otherwise protected from use or disclosure. If you are not the intended recipient, you are strictly prohibited from reviewing, disclosing, copying using or disseminating any of this information or taking any action in reliance on or regarding this information. If you have received this fax in error, please notify us immediately by telephone so that we can arrange for its return to [place]. Phone: [phone], Toll-Free: [phone], Fax: [phone] Page: 2 of 2 Call Id: [id number]

## 2017-01-21 NOTE — Telephone Encounter (Signed)
Per chart review tab pt went to [facility]. 

## 2017-01-21 NOTE — ED Triage Notes (Addendum)
Pt c/o dark red rectal bleeding x2 this morning. Pt was seen and discharged on 4/3 for the same.  Tonight pt became weak while using the bathroom and noticed dark red blood in the toilet. Pt is pale and diaphoretic in triage. c/o abdominal cramping

## 2017-01-21 NOTE — H&P (Signed)
History and Physical    [name]:[id number] DOB: [birth year] DOA: 01/21/2017  PCP: Loura Pardon, MD Patient coming from: home  Chief Complaint: abdominal pain/BRBPR  HPI: [name] is a 76 y.o. female with medical history significant  hypertension, hyperlipidemia, anxiety, arthritis, presents to the emergency department with chief complaint of bright red blood per rectum and persistent abdominal pain. Initial evaluation reveals drop in her hemoglobin from yesterday when she was evaluated as well.  Information is obtained from the patient. She states she came to the emergency department yesterday for lower abdominal pain. She was noted to have diverticulosis without diverticulitis as well as nonthrombosed external hemorrhoid and internal hemorrhoid. She was placed on Anusol and advised to follow-up with gastroenterology. She came back this morning due to persistent abdominal pain and continued episodes of bright red blood per rectum. She reports to additional episodes of bright red blood per rectum. She describes the pain as sharp constant worse in her lower quadrants but noted to be throughout abdomen. Worse with eating. In addition she reports nausea without vomiting dizziness shortness of breath. He was noted to be pale and diaphoretic upon presentation. She denies chest pain palpitations.   ED Course: In the emergency department she's afebrile hemodynamically stable with mild tachycardia and tachypnea. She's not hypoxic  Review of Systems: As per HPI otherwise 10 point review of systems negative.   Ambulatory Status: He relates independently no recent falls independent with ADLs  Past Medical History:  Diagnosis Date  . Allergic rhinitis   . Anxiety   . Cataract 2015   bilateral  . Diverticulosis   . Diverticulosis   . Esophageal reflux   . Essential hypertension 01/07/2016  . Fatty liver   . Fibula fracture 02/2005  . Former smoker   . Gastritis   . Heart murmur     . Hemorrhoids   . History of nephrolithiasis   . Hyperglycemia   . Hyperlipemia   . Osteoarthritis   . Personal history of colonic polyps 1998   hyperplastic  . RA (rheumatoid arthritis) ([facility])   . Shingles 2012  . Uterine fibroid     Past Surgical History:  Procedure Laterality Date  . CHOLECYSTECTOMY    . COLONOSCOPY  11/08   internal hemorrhoids  . EP procedure  1994   SVT; normal echo  . ESOPHAGOGASTRODUODENOSCOPY  11/03   reactive gastropathy  . LIPOMA EXCISION  11/2002  . SVT s/p surgery--? ablation    . UPPER GASTROINTESTINAL ENDOSCOPY    . vaginal polyp excised  12/2000   benign    Social History   Social History  . Marital status: Married    Spouse name: N/A  . Number of children: 3  . Years of education: N/A   Occupational History  . retired   .  Retired   Social History Main Topics  . Smoking status: Former Smoker    Quit date: 10/21/2003  . Smokeless tobacco: Never Used  . Alcohol use No  . Drug use: No  . Sexual activity: Yes   Other Topics Concern  . Not on file   Social History Narrative   Married      Retired      Occasional caffeine      No regular exercise    Allergies  Allergen Reactions  . Bee Venom Anaphylaxis  . Cetirizine Hcl     REACTION: does not work  . Hctz [Hydrochlorothiazide] Other (See Comments)    Felt  weak all over  Felt weak all over     Family History  Problem Relation Age of Onset  . Colon cancer Father 99  . Diabetes Mother   . Heart disease Mother   . Kidney disease Mother     ESRD  . Stroke Mother   . Hypertension Brother   . Breast cancer Sister   . Diabetes Other     nephews  . Colon cancer Sister   . Esophageal cancer Neg Hx   . Stomach cancer Neg Hx   . Rectal cancer Neg Hx     Prior to Admission medications   Medication Sig Start Date End Date Taking? Authorizing Provider  predniSONE (DELTASONE) 5 MG tablet Take 5 mg by mouth daily with breakfast.   Yes Historical Provider, MD   acetaminophen (TYLENOL) 500 MG tablet Take 500 mg by mouth every 6 (six) hours as needed.    Historical Provider, MD  albuterol (PROVENTIL HFA;VENTOLIN HFA) 108 (90 Base) MCG/ACT inhaler Inhale 2 puffs into the lungs every 4 (four) hours as needed for wheezing or shortness of breath. Please give spacer if possible 06/09/16   Abner Greenspan, MD  amLODipine (NORVASC) 5 MG tablet Take 1 tablet (5 mg total) by mouth daily. 10/07/16   Abner Greenspan, MD  Calcium & Magnesium Carbonates (MYLANTA PO) Take 1 capsule by mouth as directed.      Historical Provider, MD  EPINEPHrine (EPIPEN IJ) Inject as directed as needed. Reported on 12/05/2015    Historical Provider, MD  esomeprazole (NEXIUM) 20 MG capsule Take 20 mg by mouth daily at 12 noon.    Historical Provider, MD  fluticasone (FLONASE) 50 MCG/ACT nasal spray PLACE 2 SPRAYS INTO THE NOSE DAILY. 10/07/16   Abner Greenspan, MD  folic acid (FOLVITE) 1 MG tablet Take 1 mg by mouth daily.    Historical Provider, MD  hydrocortisone (ANUSOL-HC) 25 MG suppository Place 1 suppository (25 mg total) rectally 2 (two) times daily. For 7 days 01/20/17   Charlesetta Shanks, MD  loratadine (CLARITIN) 10 MG tablet Take 10 mg by mouth daily.    Historical Provider, MD  methotrexate (RHEUMATREX) 2.5 MG tablet Take 15 mg by mouth once a week. Caution:Chemotherapy. Protect from light. ( Monday of each week )    Historical Provider, MD  ondansetron (ZOFRAN-ODT) 4 MG disintegrating tablet TAKE 1 TABLET BY MOUTH EVERY 8 HOURS AS NEEDED FOR NAUSEA/VOMIT Patient not taking: Reported on 01/20/2017 01/07/16   Abner Greenspan, MD  polyethylene glycol (MIRALAX / GLYCOLAX) packet Take 17 g by mouth daily.    Historical Provider, MD  ranitidine (ZANTAC) 150 MG capsule Take 1 capsule (150 mg total) by mouth 2 (two) times daily. 10/16/16   Abner Greenspan, MD  traMADol (ULTRAM) 50 MG tablet Take 1 tablet (50 mg total) by mouth every 8 (eight) hours as needed. 01/08/17   Meredith Pel, MD     Physical Exam: Vitals:   01/21/17 0700 01/21/17 0715 01/21/17 0745 01/21/17 0800  BP: 128/82 (!) 161/66 113/72 134/70  Pulse: 90 90 92 89  Resp: (!) 23 13 (!) 27 11  Temp:      SpO2: 95% 98% 97% 97%     General:  Appears calm and comfortable Eyes:  PERRL, EOMI, normal lids, iris ENT:  grossly normal hearing, lips & tongue, Mucous membranes of her mouth are slightly pale somewhat dry Neck:  no LAD, masses or thyromegaly Cardiovascular:  RRR, + murmur No LE  edema.  Respiratory:  CTA bilaterally, no w/r/r. Normal respiratory effort. Abdomen:  soft, underdistended bowel sounds are sluggish mild diffuse tenderness throughout palpation. No guarding or rebounding Skin:  no rash or induration seen on limited exam Musculoskeletal:  grossly normal tone BUE/BLE, good ROM, no bony abnormality Psychiatric:  grossly normal mood and affect, speech fluent and appropriate, AOx3 Neurologic:  CN 2-12 grossly intact, moves all extremities in coordinated fashion, sensation intact  Labs on Admission: I have personally reviewed following labs and imaging studies  CBC:  Recent Labs Lab 01/20/17 1148 01/21/17 0510  WBC 12.6* 13.8*  HGB 13.4 11.0*  HCT 40.6 33.7*  MCV 85.7 86.0  PLT 354 280   Basic Metabolic Panel:  Recent Labs Lab 01/20/17 1148 01/21/17 0510  NA 137 138  K 3.8 3.9  CL 105 106  CO2 23 24  GLUCOSE 152* 195*  BUN 14 17  CREATININE 0.76 0.85  CALCIUM 8.9 8.8*   GFR: CrCl cannot be calculated (Unknown ideal weight.). Liver Function Tests:  Recent Labs Lab 01/20/17 1148 01/21/17 0510  AST 22 25  ALT 17 16  ALKPHOS 66 60  BILITOT 0.5 0.4  PROT 7.1 6.5  ALBUMIN 3.5 3.2*   No results for input(s): LIPASE, AMYLASE in the last 168 hours. No results for input(s): AMMONIA in the last 168 hours. Coagulation Profile: No results for input(s): INR, PROTIME in the last 168 hours. Cardiac Enzymes: No results for input(s): CKTOTAL, CKMB, CKMBINDEX, TROPONINI in the  last 168 hours. BNP (last 3 results) No results for input(s): PROBNP in the last 8760 hours. HbA1C: No results for input(s): HGBA1C in the last 72 hours. CBG: No results for input(s): GLUCAP in the last 168 hours. Lipid Profile: No results for input(s): CHOL, HDL, LDLCALC, TRIG, CHOLHDL, LDLDIRECT in the last 72 hours. Thyroid Function Tests: No results for input(s): TSH, T4TOTAL, FREET4, T3FREE, THYROIDAB in the last 72 hours. Anemia Panel: No results for input(s): VITAMINB12, FOLATE, FERRITIN, TIBC, IRON, RETICCTPCT in the last 72 hours. Urine analysis:    Component Value Date/Time   BILIRUBINUR Negative 02/15/2016 1116   PROTEINUR Trace 02/15/2016 1116   UROBILINOGEN 0.2 02/15/2016 1116   NITRITE Negative 02/15/2016 1116   LEUKOCYTESUR moderate (2+) (A) 02/15/2016 1116    Creatinine Clearance: CrCl cannot be calculated (Unknown ideal weight.).  Sepsis Labs: '@LABRCNTIP' (procalcitonin:4,lacticidven:4) )No results found for this or any previous visit (from the past 240 hour(s)).   Radiological Exams on Admission: Ct Abdomen Pelvis W Contrast  Result Date: 01/20/2017 CLINICAL DATA:  Lower abdomen pain, large amount of blood in stool today and EXAM: CT ABDOMEN AND PELVIS WITH CONTRAST TECHNIQUE: Multidetector CT imaging of the abdomen and pelvis was performed using the standard protocol following bolus administration of intravenous contrast. CONTRAST:  173m ISOVUE-300 IOPAMIDOL (ISOVUE-300) INJECTION 61% COMPARISON:  CT abdomen pelvis of 11/02/2015 FINDINGS: Lower chest: The lung bases clear. The heart is within normal limits in size. Hepatobiliary: The liver remains low in attenuation consistent with fatty infiltration. Surgical clips are noted from prior cholecystectomy. Pancreas: The pancreas is normal in size and the pancreatic duct is not dilated. Spleen: The spleen is normal in size. Adrenals/Urinary Tract: The adrenal glands are normal. The kidneys enhance with no calculus or  mass and no hydronephrosis is seen. On delayed images, the pelvocaliceal systems are unremarkable. The ureters are normal in caliber. The urinary bladder is unremarkable. Stomach/Bowel: The stomach is decompressed. No small bowel distention is seen. There are a few diverticula within  the rectosigmoid colon. No colonic lesion is seen to explain the bright red blood per rectum. There is feces throughout the colon. The terminal ileum and the appendix are somewhat high in position but normal by CT. Vascular/Lymphatic: The abdominal aorta is normal in caliber with moderate abdominal aortic atherosclerosis present. No adenopathy is seen. Reproductive: The uterus is normal in size and calcified uterine fibroids are noted. No adnexal lesion is seen. No free fluid is noted within the pelvis. Other: None. Musculoskeletal: The lumbar vertebrae are in normal alignment with degenerative disc disease primarily at L5-S1, with degenerative changes in the facet joints of the lower lumbar spine as well. IMPRESSION: 1. No explanation for the blood per rectum is seen other than a few diverticula within the rectosigmoid colon. No mass is evident by CT. 2. Suspect fatty infiltration of the liver.  Correlate with LFTs. 3. Moderate abdominal aortic atherosclerosis. 4. Calcified uterine fibroids. Electronically Signed   By: Ivar Drape M.D.   On: 01/20/2017 15:16    EKG: Independently reviewed. Sinus tachycardia with Premature supraventricular complexes Nonspecific ST abnormality   Assessment/Plan Principal Problem:   BRBPR (bright red blood per rectum) Active Problems:   GERD   Rheumatoid arthritis ([facility])   Abdominal pain   Obesity   Fatty liver   Coronary atherosclerosis   Essential hypertension   Hyperglycemia   IBS (irritable bowel syndrome)   Adjustment disorder with mixed anxiety and depressed mood   Tachycardia   Acute blood loss anemia   Diverticulosis   #1. Bright red blood per rectum/diverticulosis. Likely  related to recently noted diverticulosis on imaging. Also has large nonthrombosed external hemorrhoid with internal hemorrhoid as well. Colonoscopy done June 2017 with 1 polyp, sigmoid diverticula and internal hemorrhoids -Admit for observation -Serial CBCs -Clear liquid diet -Bowel rest -Monitor intake and output -Supportive therapy -Patient follow-up with GI  2. Acute blood loss anemia. Hemoglobin 11.0 on admission. Chart review indicates hemoglobin 13.4 yesterday. Likely related to above -Serial CBCs -Gentle IV fluids -Continue Anusol -Pain orthostatic vital signs  #3. Tachycardia. Related to above. Improved with IV fluids -Monitor  #4. Abdominal pain. Recent CT of the abdomen reveals no explanation for the blood per rectum is seen other than a few diverticula within the rectosigmoid colon. No mass is evident by CT. May be related to above.  -supportive therapy -monitor  5. Hyperglycemia. Serum glucose 195 on admission. No history of diabetes. She does have a family history of diabetes -Obtain a hemoglobin A1c -Sliding scale insulin for optimal control -Close outpatient follow-up with PCP  #6.Fatty liver. Per CT noted above. AST ALT and alk phos as well as total bili all within limits of normal.  -OP follow up  #7. Hypertension. Uncontrolled in the emergency department. Home medications include Norvasc -Continue Norvasc for now -monitor  #8. Rheumatoid arthritis. Stable at baseline. Home meds include weekly methotrexate. -hold for now   DVT prophylaxis: scd Code Status: full  Family Communication: husband at bedside  Disposition Plan: home  Consults called: none  Admission status: obs    Radene Gunning MD [facility]  If 7PM-7AM, please contact night-coverage [email or web address] Password [facility]  01/21/2017, 8:44 AM

## 2017-01-22 ENCOUNTER — Encounter (HOSPITAL_COMMUNITY): Payer: Self-pay | Admitting: Physician Assistant

## 2017-01-22 DIAGNOSIS — K5731 Diverticulosis of large intestine without perforation or abscess with bleeding: Secondary | ICD-10-CM | POA: Diagnosis not present

## 2017-01-22 DIAGNOSIS — Z6831 Body mass index (BMI) 31.0-31.9, adult: Secondary | ICD-10-CM | POA: Diagnosis not present

## 2017-01-22 DIAGNOSIS — K219 Gastro-esophageal reflux disease without esophagitis: Secondary | ICD-10-CM

## 2017-01-22 DIAGNOSIS — K5733 Diverticulitis of large intestine without perforation or abscess with bleeding: Secondary | ICD-10-CM | POA: Diagnosis present

## 2017-01-22 DIAGNOSIS — R0682 Tachypnea, not elsewhere classified: Secondary | ICD-10-CM | POA: Diagnosis present

## 2017-01-22 DIAGNOSIS — E1165 Type 2 diabetes mellitus with hyperglycemia: Secondary | ICD-10-CM | POA: Diagnosis present

## 2017-01-22 DIAGNOSIS — I2510 Atherosclerotic heart disease of native coronary artery without angina pectoris: Secondary | ICD-10-CM | POA: Diagnosis present

## 2017-01-22 DIAGNOSIS — K760 Fatty (change of) liver, not elsewhere classified: Secondary | ICD-10-CM | POA: Diagnosis present

## 2017-01-22 DIAGNOSIS — E861 Hypovolemia: Secondary | ICD-10-CM | POA: Diagnosis present

## 2017-01-22 DIAGNOSIS — R1032 Left lower quadrant pain: Secondary | ICD-10-CM | POA: Diagnosis not present

## 2017-01-22 DIAGNOSIS — R739 Hyperglycemia, unspecified: Secondary | ICD-10-CM | POA: Diagnosis not present

## 2017-01-22 DIAGNOSIS — I10 Essential (primary) hypertension: Secondary | ICD-10-CM | POA: Diagnosis not present

## 2017-01-22 DIAGNOSIS — T380X5A Adverse effect of glucocorticoids and synthetic analogues, initial encounter: Secondary | ICD-10-CM | POA: Diagnosis not present

## 2017-01-22 DIAGNOSIS — K922 Gastrointestinal hemorrhage, unspecified: Secondary | ICD-10-CM | POA: Diagnosis not present

## 2017-01-22 DIAGNOSIS — K589 Irritable bowel syndrome without diarrhea: Secondary | ICD-10-CM | POA: Diagnosis present

## 2017-01-22 DIAGNOSIS — R000 Tachycardia, unspecified: Secondary | ICD-10-CM | POA: Diagnosis present

## 2017-01-22 DIAGNOSIS — Z86010 Personal history of colonic polyps: Secondary | ICD-10-CM | POA: Diagnosis not present

## 2017-01-22 DIAGNOSIS — K921 Melena: Secondary | ICD-10-CM | POA: Diagnosis not present

## 2017-01-22 DIAGNOSIS — F4323 Adjustment disorder with mixed anxiety and depressed mood: Secondary | ICD-10-CM | POA: Diagnosis present

## 2017-01-22 DIAGNOSIS — K644 Residual hemorrhoidal skin tags: Secondary | ICD-10-CM | POA: Diagnosis present

## 2017-01-22 DIAGNOSIS — K559 Vascular disorder of intestine, unspecified: Secondary | ICD-10-CM | POA: Diagnosis present

## 2017-01-22 DIAGNOSIS — M1990 Unspecified osteoarthritis, unspecified site: Secondary | ICD-10-CM | POA: Diagnosis present

## 2017-01-22 DIAGNOSIS — E785 Hyperlipidemia, unspecified: Secondary | ICD-10-CM | POA: Diagnosis present

## 2017-01-22 DIAGNOSIS — E669 Obesity, unspecified: Secondary | ICD-10-CM | POA: Diagnosis present

## 2017-01-22 DIAGNOSIS — Z87891 Personal history of nicotine dependence: Secondary | ICD-10-CM | POA: Diagnosis not present

## 2017-01-22 DIAGNOSIS — D62 Acute posthemorrhagic anemia: Secondary | ICD-10-CM | POA: Diagnosis not present

## 2017-01-22 DIAGNOSIS — Z79899 Other long term (current) drug therapy: Secondary | ICD-10-CM | POA: Diagnosis not present

## 2017-01-22 DIAGNOSIS — Z7952 Long term (current) use of systemic steroids: Secondary | ICD-10-CM | POA: Diagnosis not present

## 2017-01-22 DIAGNOSIS — M069 Rheumatoid arthritis, unspecified: Secondary | ICD-10-CM | POA: Diagnosis present

## 2017-01-22 DIAGNOSIS — K625 Hemorrhage of anus and rectum: Secondary | ICD-10-CM | POA: Diagnosis not present

## 2017-01-22 LAB — BASIC METABOLIC PANEL
ANION GAP: 5 (ref 5–15)
BUN: 6 mg/dL (ref 6–20)
CO2: 26 mmol/L (ref 22–32)
CREATININE: 0.63 mg/dL (ref 0.44–1.00)
Calcium: 8.1 mg/dL — ABNORMAL LOW (ref 8.9–10.3)
Chloride: 107 mmol/L (ref 101–111)
Glucose, Bld: 112 mg/dL — ABNORMAL HIGH (ref 65–99)
Potassium: 3.6 mmol/L (ref 3.5–5.1)
SODIUM: 138 mmol/L (ref 135–145)

## 2017-01-22 LAB — CBC
HCT: 24.6 % — ABNORMAL LOW (ref 36.0–46.0)
Hemoglobin: 8 g/dL — ABNORMAL LOW (ref 12.0–15.0)
MCH: 28 pg (ref 26.0–34.0)
MCHC: 32.5 g/dL (ref 30.0–36.0)
MCV: 86 fL (ref 78.0–100.0)
PLATELETS: 265 10*3/uL (ref 150–400)
RBC: 2.86 MIL/uL — ABNORMAL LOW (ref 3.87–5.11)
RDW: 14.7 % (ref 11.5–15.5)
WBC: 8.9 10*3/uL (ref 4.0–10.5)

## 2017-01-22 LAB — HEMOGLOBIN A1C
HEMOGLOBIN A1C: 6.9 % — AB (ref 4.8–5.6)
MEAN PLASMA GLUCOSE: 151 mg/dL

## 2017-01-22 LAB — GLUCOSE, CAPILLARY
Glucose-Capillary: 116 mg/dL — ABNORMAL HIGH (ref 65–99)
Glucose-Capillary: 130 mg/dL — ABNORMAL HIGH (ref 65–99)
Glucose-Capillary: 131 mg/dL — ABNORMAL HIGH (ref 65–99)
Glucose-Capillary: 148 mg/dL — ABNORMAL HIGH (ref 65–99)

## 2017-01-22 NOTE — Consult Note (Signed)
[facility] Consult: 12:23 PM 01/22/2017  LOS: 0 days    Referring Provider: Dr Charlies Silvers  Primary Care Physician:  Loura Pardon, MD Primary Gastroenterologist:  Dr. Fuller Plan    Reason for Consultation:  Hematochezia.   HPI: [name] is a 76 y.o. female.  PMH Rheumatoid arthritis, on methotrexate and 5 mg daily prednisone. Degenerative spinal disease.  GERD, on Nexium.  Type  2 DM, on no meds. Hepatitis ABC serologies negative 01/14/17.  1998 colonoscopy. Hyperplastic polyp 2003 EGD.  Gastritis 2003 colonoscopy.  Internal hemorrhoids. Moderately difficult exam due to tortuous colon. 08/2007 colonoscopy.  Hemorrhoids.  09/2009 EGD.  For dysphagia, GERD.  Normal study to the second duodenum. No stricturing or stenosis noted but underwent empiric 17 mm savary dilation of esophagus.  04/2012 esophagram: Unremarkable 12/2012 colonoscopy.  Mild sigmoid diverticulosis, internal hemorrhoids. 04/10/2016 EGD.  For weight loss, FOBT positive, abdominal complaints. Small HH.  Normal esophagus and stomach, normal study to second portion of duodenum.  04/10/16 colonoscopy.  4 unexplained GI bleeding, FOBT positive stool, + family history of colon cancer.  Nonthrombosed external hemorrhoids/tags on digital exam. The 5 mm sigmoid polyp (hyperplastic) resected. Mild sigmoid diverticulosis.   For close to 2 weeks patient has been having vague abdominal pain which is predominantly in the upper abdomen. It had not been associated with nausea or vomiting or change in her bowel habits. It was exacerbated by food intake but not by intake of liquids. Last week she was seen by her rheumatologist to initiated weekly methotrexate, she started this on Monday 3/2. Also initiated Ultram, low-dose prednisone and folic acid. This was because her  hands have been swelling and are painful and she has other joint pains which have not been well controlled.  On 4 3 she developed worsening of the abdominal pain, it is now in the lower abdomen.She had a bloody bowel movement soon after the pain developed.  She did not have nausea or vomiting but does report chronic reflux symptoms. Patient uses MiraLAX daily for constipation.  On DRE examining physician encountered nonthrombosed, large external hemorrhoid tag and palpable soft, nontender internal hemorrhoids. There was some blood-tinged mucus in the vault and no melena.  Hemoglobin was 13.4.  She was prescribed Anusol suppository and advised to seek follow-up with Dr. Fuller Plan.  She never had time to fill the Anusol prescription.   That evening she had more episodes of hematochezia, these were large and made the commode water look like she had replaced water with blood. She had no pain in her rectum but the pain in her lower abdominal pain persisted and radiating into the upper abdomen. She developed weakness/dizziness but no syncope. She felt nausea but had no emesis. She reported no use of any anticoagulants or aspirin/NSAID products.  On exam she appeared pale, diaphoretic. Hemoglobin was then 11. She was admitted to the hospitalist service.  Hgbs in last 3 days: 13.4.. 11.0.. 8.7.. 8.0.  Baseline is 14.3.  MCV normal. No evidence for azotemia or renal insufficiency.  FOBT +.  No coags have  been collected.  She doesn't tend to have any bleeding or bruising issues. 01/20/17 CT abdomen pelvis with contrast with rectosigmoid diverticula. Probable fatty liver. Moderate abdominal aortic atherosclerosis. Uterine fibroids Since her admission, she's had continued hematochezia/bloody stools. The volume seems to be diminishing. Her last episode was this morning. Abdominal pain persists.   Past Medical History:  Diagnosis Date  . Allergic rhinitis   . Anxiety   . Cataract 2015   bilateral  . Diabetes mellitus  with complication ([place])   . Diverticulosis   . Diverticulosis   . Esophageal reflux   . Essential hypertension 01/07/2016  . Fatty liver   . Fibula fracture 02/2005  . Former smoker   . Gastritis   . Heart murmur   . Hemorrhoids   . History of nephrolithiasis   . Hyperglycemia   . Hyperlipemia   . Osteoarthritis   . Personal history of colonic polyps 1998   hyperplastic  . RA (rheumatoid arthritis) ([place])   . Shingles 2012  . Uterine fibroid     Past Surgical History:  Procedure Laterality Date  . CHOLECYSTECTOMY    . COLONOSCOPY  11/08   internal hemorrhoids  . EP procedure  1994   SVT; normal echo  . ESOPHAGOGASTRODUODENOSCOPY  11/03   reactive gastropathy  . LIPOMA EXCISION  11/2002  . SVT s/p surgery--? ablation    . UPPER GASTROINTESTINAL ENDOSCOPY    . vaginal polyp excised  12/2000   benign    Prior to Admission medications   Medication Sig Start Date End Date Taking? Authorizing Provider  predniSONE (DELTASONE) 5 MG tablet Take 5 mg by mouth daily with breakfast.   Yes Historical Provider, MD  acetaminophen (TYLENOL) 500 MG tablet Take 500 mg by mouth every 6 (six) hours as needed.    Historical Provider, MD  albuterol (PROVENTIL HFA;VENTOLIN HFA) 108 (90 Base) MCG/ACT inhaler Inhale 2 puffs into the lungs every 4 (four) hours as needed for wheezing or shortness of breath. Please give spacer if possible 06/09/16   Abner Greenspan, MD  amLODipine (NORVASC) 5 MG tablet Take 1 tablet (5 mg total) by mouth daily. 10/07/16   Abner Greenspan, MD  Calcium & Magnesium Carbonates (MYLANTA PO) Take 1 capsule by mouth as directed.      Historical Provider, MD  EPINEPHrine (EPIPEN IJ) Inject as directed as needed. Reported on 12/05/2015    Historical Provider, MD  esomeprazole (NEXIUM) 20 MG capsule Take 20 mg by mouth daily at 12 noon.    Historical Provider, MD  fluticasone (FLONASE) 50 MCG/ACT nasal spray PLACE 2 SPRAYS INTO THE NOSE DAILY. 10/07/16   Abner Greenspan, MD  folic acid  (FOLVITE) 1 MG tablet Take 1 mg by mouth daily.    Historical Provider, MD  hydrocortisone (ANUSOL-HC) 25 MG suppository Place 1 suppository (25 mg total) rectally 2 (two) times daily. For 7 days 01/20/17   Charlesetta Shanks, MD  loratadine (CLARITIN) 10 MG tablet Take 10 mg by mouth daily.    Historical Provider, MD  methotrexate (RHEUMATREX) 2.5 MG tablet Take 15 mg by mouth once a week. Caution:Chemotherapy. Protect from light. ( Monday of each week )    Historical Provider, MD  ondansetron (ZOFRAN-ODT) 4 MG disintegrating tablet TAKE 1 TABLET BY MOUTH EVERY 8 HOURS AS NEEDED FOR NAUSEA/VOMIT Patient not taking: Reported on 01/20/2017 01/07/16   Abner Greenspan, MD  polyethylene glycol (MIRALAX / GLYCOLAX) packet Take 17 g by mouth daily.  Historical Provider, MD  ranitidine (ZANTAC) 150 MG capsule Take 1 capsule (150 mg total) by mouth 2 (two) times daily. 10/16/16   Abner Greenspan, MD  traMADol (ULTRAM) 50 MG tablet Take 1 tablet (50 mg total) by mouth every 8 (eight) hours as needed. 01/08/17   Meredith Pel, MD    Scheduled Meds: . amLODipine  5 mg Oral Daily  . famotidine  10 mg Oral Daily  . feeding supplement  1 Container Oral TID BM  . fluticasone  1 spray Each Nare Daily  . folic acid  1 mg Oral Daily  . hydrocortisone  25 mg Rectal BID  . insulin aspart  0-15 Units Subcutaneous TID WC  . loratadine  10 mg Oral Daily  . pantoprazole  40 mg Oral Daily  . polyethylene glycol  17 g Oral Daily  . predniSONE  5 mg Oral Q breakfast  . sodium chloride flush  3 mL Intravenous Q12H   Infusions:  PRN Meds: acetaminophen, albuterol, HYDROcodone-acetaminophen, ondansetron **OR** ondansetron (ZOFRAN) IV, traMADol   Allergies as of 01/21/2017 - Review Complete 01/21/2017  Allergen Reaction Noted  . Bee venom Anaphylaxis 12/09/2012  . Cetirizine hcl  12/10/2009  . Hctz [hydrochlorothiazide] Other (See Comments) 02/06/2016    Family History  Problem Relation Age of Onset  . Colon  cancer Father 66  . Diabetes Mother   . Heart disease Mother   . Kidney disease Mother     ESRD  . Stroke Mother   . Hypertension Brother   . Breast cancer Sister   . Diabetes Other     nephews  . Colon cancer Sister   . Esophageal cancer Neg Hx   . Stomach cancer Neg Hx   . Rectal cancer Neg Hx     Social History   Social History  . Marital status: Married    Spouse name: N/A  . Number of children: 3  . Years of education: N/A   Occupational History  . retired   .  Retired   Social History Main Topics  . Smoking status: Former Smoker    Quit date: 10/21/2003  . Smokeless tobacco: Never Used  . Alcohol use No  . Drug use: No  . Sexual activity: Yes   Other Topics Concern  . Not on file   Social History Narrative   Married      Retired      Occasional caffeine      No regular exercise    REVIEW OF SYSTEMS: Constitutional:  Weakness, dizziness with recent events is improved. Generally she is able to perform household tasks including cooking, cleaning but she does not exercise. ENT:  No nose bleeds.  Tends to suffer seasonal allergies and sinus congestion which is currently flared here in pollen season. Pulm:  No shortness of breath, cough. CV:  No palpitations, no LE edema. No chest pain. No orthopnea. GU:  No hematuria, no frequency GI:  Per HPI Heme:  Per HPI   Transfusions:  No records found of previous blood transfusions dating back to 1998.  Neuro:  No headaches, no peripheral tingling or numbness Derm:  No itching, no rash or sores.  Endocrine:  No sweats or chills.  No polyuria or dysuria Immunization:  Reviewed immunization records. She is up-to-date on Pneumovax, influenza and Td.   Travel:  None beyond local counties in last few months.    PHYSICAL EXAM: Vital signs in last 24 hours: Vitals:   01/22/17 0512 01/22/17  1007  BP: 134/65 (!) 132/58  Pulse: 73   Resp: 18   Temp: 98 F (36.7 C)    Wt Readings from Last 3 Encounters:  01/22/17  90.7 kg (199 lb 14.4 oz)  10/28/16 92.3 kg (203 lb 8 oz)  10/07/16 92 kg (202 lb 12 oz)    General: Pleasant, overweight, somewhat pale but not acutely ill appearing AAF.  She does not look chronically ill either Head:  No asymmetry, no swelling. No signs of head trauma  Eyes:  No scleral icterus. No conjunctival pallor. EOMI. Ears:  No hearing deficits  Nose:  Sounds congested Mouth:  Oral mucosa is pink, moist and clear. Good dental repair. Tongue midline. Neck:  No JVD, no masses, no thyromegaly. Lungs:  Clear bilaterally. No cough. No dyspnea. Heart: RRR with 2 -3/6 somewhat harsh systolic murmur. S1, S2 audible. Abdomen:  Soft. Obese.  Mild tenderness in all 4 quadrants. Bowel sounds active and none are tingling or tympanitic. No HSM, hernias or masses. No bruits..   Rectal: External hemorrhoid tag. No masses. Red blood within the rectal vault.   Musc/Skeltl: Some swelling in the hands and fingers. No gross joint deformities. Extremities:  No pedal/lower extremity edema.  Neurologic:  Alert. Good historian. Oriented times 3. No tremor. Moves all 4 limbs, limb strength was not tested. Skin:  No rashes, telangiectasias or sores. Tattoos:  None Nodes:  No cervical adenopathy.   Psych:  Cooperative, pleasant, calm.  Intake/Output from previous day: 04/04 0701 - 04/05 0700 In: 1678.8 [P.O.:360; I.V.:318.8; IV Piggyback:1000] Out: 520 [Urine:520] Intake/Output this shift: Total I/O In: 1080 [P.O.:1080] Out: 500 [Urine:500]  LAB RESULTS:  Recent Labs  01/21/17 0510 01/21/17 1510 01/22/17 0322  WBC 13.8* 10.4 8.9  HGB 11.0* 8.7* 8.0*  HCT 33.7* 26.5* 24.6*  PLT 386 276 265   BMET Lab Results  Component Value Date   NA 138 01/22/2017   NA 138 01/21/2017   NA 137 01/20/2017   K 3.6 01/22/2017   K 3.9 01/21/2017   K 3.8 01/20/2017   CL 107 01/22/2017   CL 106 01/21/2017   CL 105 01/20/2017   CO2 26 01/22/2017   CO2 24 01/21/2017   CO2 23 01/20/2017   GLUCOSE 112  (H) 01/22/2017   GLUCOSE 195 (H) 01/21/2017   GLUCOSE 152 (H) 01/20/2017   BUN 6 01/22/2017   BUN 17 01/21/2017   BUN 14 01/20/2017   CREATININE 0.63 01/22/2017   CREATININE 0.85 01/21/2017   CREATININE 0.76 01/20/2017   CALCIUM 8.1 (L) 01/22/2017   CALCIUM 8.8 (L) 01/21/2017   CALCIUM 8.9 01/20/2017   LFT  Recent Labs  01/20/17 1148 01/21/17 0510  PROT 7.1 6.5  ALBUMIN 3.5 3.2*  AST 22 25  ALT 17 16  ALKPHOS 66 60  BILITOT 0.5 0.4   PT/INR No results found for: INR, PROTIME Hepatitis Panel No results for input(s): HEPBSAG, HCVAB, HEPAIGM, HEPBIGM in the last 72 hours. C-Diff No components found for: CDIFF Lipase     Component Value Date/Time   LIPASE 24.0 02/15/2016 1054    Drugs of Abuse  No results found for: LABOPIA, COCAINSCRNUR, LABBENZ, AMPHETMU, THCU, LABBARB   RADIOLOGY STUDIES: Ct Abdomen Pelvis W Contrast  Result Date: 01/20/2017 CLINICAL DATA:  Lower abdomen pain, large amount of blood in stool today and EXAM: CT ABDOMEN AND PELVIS WITH CONTRAST TECHNIQUE: Multidetector CT imaging of the abdomen and pelvis was performed using the standard protocol following bolus administration of intravenous  contrast. CONTRAST:  126mL ISOVUE-300 IOPAMIDOL (ISOVUE-300) INJECTION 61% COMPARISON:  CT abdomen pelvis of 11/02/2015 FINDINGS: Lower chest: The lung bases clear. The heart is within normal limits in size. Hepatobiliary: The liver remains low in attenuation consistent with fatty infiltration. Surgical clips are noted from prior cholecystectomy. Pancreas: The pancreas is normal in size and the pancreatic duct is not dilated. Spleen: The spleen is normal in size. Adrenals/Urinary Tract: The adrenal glands are normal. The kidneys enhance with no calculus or mass and no hydronephrosis is seen. On delayed images, the pelvocaliceal systems are unremarkable. The ureters are normal in caliber. The urinary bladder is unremarkable. Stomach/Bowel: The stomach is decompressed. No  small bowel distention is seen. There are a few diverticula within the rectosigmoid colon. No colonic lesion is seen to explain the bright red blood per rectum. There is feces throughout the colon. The terminal ileum and the appendix are somewhat high in position but normal by CT. Vascular/Lymphatic: The abdominal aorta is normal in caliber with moderate abdominal aortic atherosclerosis present. No adenopathy is seen. Reproductive: The uterus is normal in size and calcified uterine fibroids are noted. No adnexal lesion is seen. No free fluid is noted within the pelvis. Other: None. Musculoskeletal: The lumbar vertebrae are in normal alignment with degenerative disc disease primarily at L5-S1, with degenerative changes in the facet joints of the lower lumbar spine as well. IMPRESSION: 1. No explanation for the blood per rectum is seen other than a few diverticula within the rectosigmoid colon. No mass is evident by CT. 2. Suspect fatty infiltration of the liver.  Correlate with LFTs. 3. Moderate abdominal aortic atherosclerosis. 4. Calcified uterine fibroids. Electronically Signed   By: Ivar Drape M.D.   On: 01/20/2017 15:16      IMPRESSION:   *   Hematochezia with 10-14 day prodrome of vague abdominal pain which worsened in recent days. Patient does have known hemorrhoids which could be bleeding but would not be the source of her abdominal pain. Generally diverticular bleeding alone is painless. Her story sounds more consistent with an ischemic colitis, however no evidence of colitis on her CT scan. Due to family history of colon cancer in her father. Patient is well up-to-date on screening colonoscopies and has only ever had hyperplastic polyps. Mild sigmoid diverticulosis and internal hemorrhoids also documented on several colonoscopies. History of gastritis many years ago and maintained on daily Nexium.  *  Acute blood loss anemia.  *  Rheumatoid arthritis. Her rheumatologist added methotrexate,  low-dose prednisone, tramadol and folic acid to her medical regimen last week.  *  Probable fatty liver, noted on CT scan.  Her LFTs are well within normal limits though her albumin is slightly low at 3.2.    PLAN:     *  ?  Does she need flexible sigmoidoscopy versus colonoscopy. Dr. Silverio Decamp will be seeing the patient and making her assessment.   Azucena Freed  01/22/2017, 12:23 PM Pager: [phone]    Attending physician's note   I have taken a history, examined the patient and reviewed the chart. I agree with the Advanced Practitioner's note, impression and recommendations. 76 year old female admitted with hematochezia, hemoglobin dropped from 14 to 8 On exam abdomen is soft, not distended and nontender She has had multiple colonoscopies in the past, most recent in June 2017 with findings of sigmoid diverticulosis and removed a diminutive hyperplastic polyp in sigmoid colon Likely etiology of her hematochezia is diverticular hemorrhage If she has a recurrent bleed, we'll  request nuclear med RBC tag scan and angiogram embolization by IR Hold off repeat colonoscopy as it likely to be low yield in treatment of diverticular bleed  K Denzil Magnuson, MD [phone] Mon-Fri 8a-5p [phone] after 5p, weekends, holidays

## 2017-01-22 NOTE — Progress Notes (Addendum)
Patient ID: [name], female   DOB: 06-27-1941, 75 y.o.   MRN: [id number]  PROGRESS NOTE    [name]  WUJ:[id number] DOB: 1941/02/17 DOA: 01/21/2017  PCP: Loura Pardon, MD   Brief Narrative:  76 y.o. female with medical history significant  hypertension, rheumatoid arthritis (on prednisone), GERD, anxiety who presented to [facility] ED with bright red blood per rectum and persistent abdominal pain for past few days prior to this admission. She was seen in ED 01/20/17 for same issue, told she has diverticular bleed, given anusol prescription and told to follow up with GI. Bleeding persisted and she came to ED again for evaluation.  Her abdominal pain was in lower abdomen, constant, sharp and 8/10 in intensity at worst. Pain was worse with eating.  Last colonoscopy was 03/2016 - 5 mm polyp in the sigmoid colon. The polyp was sessile. The polyp was removed. A few medium-mouthed diverticula were found in the sigmoid colon. There was no evidence of diverticular bleeding. Internal hemorrhoids were found during retroflexion. The exam was otherwise normal throughout the examined colon.  In ED, pt was hemodynamically stable. hgb was 11 and repeat value 8.7. CT Abd showed few diverticula within the rectosigmoid colon. Calcified uterine fibroids. GI consulted.   Assessment & Plan:   Principal Problem:   BRBPR (bright red blood per rectum) / Acute blood loss anemia - Likely diverticular bleed - Colonoscopy in 03/2016 showed  5 mm polyp in the sigmoid colon. The polyp was sessile. A few medium-mouthed diverticula were found in the sigmoid colon. There was no evidence of diverticular bleeding. Internal hemorrhoids were found during retroflexion. The exam was otherwise normal throughout the examined colon. - Hgb now down to 8 this am - Continue pepcid and protonix - GI consulted - Follow up CBC   Active Problems:   GERD - Continue Pepcid and protonix    Essential hypertension - Continue Norvasc 5  mg daily    RA (rheumatoid arthritis) ([place]) - Continue low dose prednisone     Steroid induced hyperglycemia - Continue SSI   DVT prophylaxis: SCD's bilaterally  Code Status: full code  Family Communication: family at the bedside this am Disposition Plan: home once cleared by GI   Consultants:   GI  Procedures:   None   Antimicrobials:   None    Subjective: Had blood with BM yesterday night.   Objective: Vitals:   01/21/17 2153 01/22/17 0109 01/22/17 0512 01/22/17 1007  BP: (!) 141/58 (!) 124/59 134/65 (!) 132/58  Pulse: 75 82 73   Resp: 18 18 18    Temp: 98.3 F (36.8 C) 98.2 F (36.8 C) 98 F (36.7 C)   TempSrc: Oral Oral Oral   SpO2: 96% 95% 97%   Weight:   90.7 kg (199 lb 14.4 oz)   Height:        Intake/Output Summary (Last 24 hours) at 01/22/17 1050 Last data filed at 01/22/17 0937  Gross per 24 hour  Intake          1758.75 ml  Output             1020 ml  Net           738.75 ml   Filed Weights   01/21/17 1407 01/22/17 0512  Weight: 89.9 kg (198 lb 4.8 oz) 90.7 kg (199 lb 14.4 oz)    Examination:  General exam: Appears calm and comfortable  Respiratory system: Clear to auscultation. Respiratory effort normal. Cardiovascular system:  S1 & S2 heard, Rate controlled  Gastrointestinal system: Abdomen is nondistended, soft and nontender. No organomegaly or masses felt. Normal bowel sounds heard. Central nervous system: Alert and oriented. No focal neurological deficits. Extremities: Symmetric 5 x 5 power. Skin: No rashes, lesions or ulcers Psychiatry: Judgement and insight appear normal. Mood & affect appropriate.   Data Reviewed: I have personally reviewed following labs and imaging studies  CBC:  Recent Labs Lab 01/20/17 1148 01/21/17 0510 01/21/17 1510 01/22/17 0322  WBC 12.6* 13.8* 10.4 8.9  HGB 13.4 11.0* 8.7* 8.0*  HCT 40.6 33.7* 26.5* 24.6*  MCV 85.7 86.0 85.8 86.0  PLT 354 386 276 409   Basic Metabolic Panel:  Recent  Labs Lab 01/20/17 1148 01/21/17 0510 01/22/17 0322  NA 137 138 138  K 3.8 3.9 3.6  CL 105 106 107  CO2 23 24 26   GLUCOSE 152* 195* 112*  BUN 14 17 6   CREATININE 0.76 0.85 0.63  CALCIUM 8.9 8.8* 8.1*   GFR: Estimated Creatinine Clearance: 70 mL/min (by C-G formula based on SCr of 0.63 mg/dL). Liver Function Tests:  Recent Labs Lab 01/20/17 1148 01/21/17 0510  AST 22 25  ALT 17 16  ALKPHOS 66 60  BILITOT 0.5 0.4  PROT 7.1 6.5  ALBUMIN 3.5 3.2*   No results for input(s): LIPASE, AMYLASE in the last 168 hours. No results for input(s): AMMONIA in the last 168 hours. Coagulation Profile: No results for input(s): INR, PROTIME in the last 168 hours. Cardiac Enzymes: No results for input(s): CKTOTAL, CKMB, CKMBINDEX, TROPONINI in the last 168 hours. BNP (last 3 results) No results for input(s): PROBNP in the last 8760 hours. HbA1C:  Recent Labs  01/21/17 0724  HGBA1C 6.9*   CBG:  Recent Labs Lab 01/21/17 1152 01/21/17 1656 01/21/17 2146 01/22/17 0757  GLUCAP 145* 113* 138* 116*   Lipid Profile: No results for input(s): CHOL, HDL, LDLCALC, TRIG, CHOLHDL, LDLDIRECT in the last 72 hours. Thyroid Function Tests:  Recent Labs  01/21/17 0743  TSH 0.685   Anemia Panel: No results for input(s): VITAMINB12, FOLATE, FERRITIN, TIBC, IRON, RETICCTPCT in the last 72 hours. Urine analysis:    Component Value Date/Time   COLORURINE YELLOW 01/21/2017 1214   APPEARANCEUR HAZY (A) 01/21/2017 1214   LABSPEC 1.031 (H) 01/21/2017 1214   PHURINE 5.0 01/21/2017 1214   GLUCOSEU NEGATIVE 01/21/2017 1214   HGBUR NEGATIVE 01/21/2017 1214   BILIRUBINUR NEGATIVE 01/21/2017 1214   BILIRUBINUR Negative 02/15/2016 1116   KETONESUR NEGATIVE 01/21/2017 1214   PROTEINUR NEGATIVE 01/21/2017 1214   UROBILINOGEN 0.2 02/15/2016 1116   NITRITE NEGATIVE 01/21/2017 1214   LEUKOCYTESUR NEGATIVE 01/21/2017 1214   Sepsis Labs: @LABRCNTIP (procalcitonin:4,lacticidven:4)   )No results  found for this or any previous visit (from the past 240 hour(s)).    Radiology Studies: Ct Abdomen Pelvis W Contrast Result Date: 01/20/2017 1. No explanation for the blood per rectum is seen other than a few diverticula within the rectosigmoid colon. No mass is evident by CT. 2. Suspect fatty infiltration of the liver.  Correlate with LFTs. 3. Moderate abdominal aortic atherosclerosis. 4. Calcified uterine fibroids.    Scheduled Meds: . amLODipine  5 mg Oral Daily  . famotidine  10 mg Oral Daily  . feeding supplement  1 Container Oral TID BM  . fluticasone  1 spray Each Nare Daily  . folic acid  1 mg Oral Daily  . hydrocortisone  25 mg Rectal BID  . insulin aspart  0-15 Units Subcutaneous TID  WC  . insulin aspart  0-5 Units Subcutaneous QHS  . loratadine  10 mg Oral Daily  . pantoprazole  40 mg Oral Daily  . polyethylene glycol  17 g Oral Daily  . predniSONE  5 mg Oral Q breakfast  . sodium chloride flush  3 mL Intravenous Q12H   Continuous Infusions:   LOS: 0 days    Time spent: 25 minutes  Greater than 50% of the time spent on counseling and coordinating the care.   Leisa Lenz, MD [facility] Pager [phone]  If 7PM-7AM, please contact night-coverage [email or web address] Password [email or web address] 01/22/2017, 10:50 AM

## 2017-01-22 NOTE — Progress Notes (Signed)
Patient had a quiet day.  Had one episode of bloody stool this shift.  Family at bedside most of the shift.  No complaints voiced. Marcille Blanco, RN

## 2017-01-22 NOTE — Progress Notes (Signed)
Initial Nutrition Assessment  DOCUMENTATION CODES:   Obesity unspecified  INTERVENTION:   Boost Breeze po TID, each supplement provides 250 kcal and 9 grams of protein  NUTRITION DIAGNOSIS:   Inadequate oral intake related to acute illness, GI bleed as evidenced by other (see comment) (clear liquid diet).  GOAL:   Patient will meet greater than or equal to 90% of their needs  MONITOR:   PO intake, Supplement acceptance, Labs, Weight trends  REASON FOR ASSESSMENT:   Malnutrition Screening Tool    ASSESSMENT:   75 y.o. female with medical history significant for hypertension, hyperlipidemia, anxiety, arthritis, presents to the emergency department with chief complaint of bright red blood per rectum and persistent abdominal pain.  Pt with possible diverticulosis    Met with pt in room today. Pt reports poor appetite and oral intake for 3 weeks pta. Pt currently eating 75% of clear liquid diet and drinking 3 Boost Breeze per day. Per chart, pt is weight stable. Pt likes Boost Breeze and would like to keep getting it when diet advanced. RD discussed with pt the importance of adequate protein intake to prevent loss of lean muscle mass. Plan is to advance diet slowly as tolerated. GI bleed likely from diverticulitis per MD note.   Medications reviewed and include: pepcid, folic acid, insulin, protonix, miralax, prednisone  Labs reviewed: Ca 8.1(L) adj. 8.74(L), alb 3.2(L) Hgb 8.0(L), Hct 24.6(L) cbgs- 195, 112 x 24 hrs AIC 6.9 4/4  Nutrition-Focused physical exam completed. Findings are no fat depletion, no muscle depletion, and no edema.   Diet Order:  Diet clear liquid Room service appropriate? Yes; Fluid consistency: Thin  Skin:  Reviewed, no issues  Last BM:  4/5  Height:   Ht Readings from Last 1 Encounters:  01/21/17 5' 6" (1.676 m)    Weight:   Wt Readings from Last 1 Encounters:  01/22/17 199 lb 14.4 oz (90.7 kg)    Ideal Body Weight:     BMI:  Body mass  index is 32.26 kg/m.  Estimated Nutritional Needs:   Kcal:  1600-1900kcal/day   Protein:  90-108g/day   Fluid:  >1.6L/day   EDUCATION NEEDS:   No education needs identified at this time   , RD, LDN Pager #- [phone]  

## 2017-01-23 ENCOUNTER — Inpatient Hospital Stay (HOSPITAL_COMMUNITY): Payer: Medicare Other

## 2017-01-23 DIAGNOSIS — K921 Melena: Secondary | ICD-10-CM

## 2017-01-23 LAB — BASIC METABOLIC PANEL
ANION GAP: 10 (ref 5–15)
BUN: <5 mg/dL — ABNORMAL LOW (ref 6–20)
CO2: 26 mmol/L (ref 22–32)
Calcium: 8.4 mg/dL — ABNORMAL LOW (ref 8.9–10.3)
Chloride: 104 mmol/L (ref 101–111)
Creatinine, Ser: 0.62 mg/dL (ref 0.44–1.00)
GFR calc Af Amer: >60 mL/min (ref >60)
GLUCOSE: 119 mg/dL — AB (ref 65–99)
Potassium: 3.3 mmol/L — ABNORMAL LOW (ref 3.5–5.1)
Sodium: 140 mmol/L (ref 135–145)

## 2017-01-23 LAB — GLUCOSE, CAPILLARY
GLUCOSE-CAPILLARY: 132 mg/dL — AB (ref 65–99)
Glucose-Capillary: 124 mg/dL — ABNORMAL HIGH (ref 65–99)
Glucose-Capillary: 175 mg/dL — ABNORMAL HIGH (ref 65–99)
Glucose-Capillary: 181 mg/dL — ABNORMAL HIGH (ref 65–99)

## 2017-01-23 LAB — CBC
HEMATOCRIT: 24.6 % — AB (ref 36.0–46.0)
HEMATOCRIT: 26.4 % — AB (ref 36.0–46.0)
HEMOGLOBIN: 7.9 g/dL — AB (ref 12.0–15.0)
HEMOGLOBIN: 8.6 g/dL — AB (ref 12.0–15.0)
MCH: 27.7 pg (ref 26.0–34.0)
MCH: 28.4 pg (ref 26.0–34.0)
MCHC: 32.1 g/dL (ref 30.0–36.0)
MCHC: 32.6 g/dL (ref 30.0–36.0)
MCV: 86.3 fL (ref 78.0–100.0)
MCV: 87.1 fL (ref 78.0–100.0)
PLATELETS: 358 10*3/uL (ref 150–400)
Platelets: 302 10*3/uL (ref 150–400)
RBC: 2.85 MIL/uL — AB (ref 3.87–5.11)
RBC: 3.03 MIL/uL — AB (ref 3.87–5.11)
RDW: 14.7 % (ref 11.5–15.5)
RDW: 14.8 % (ref 11.5–15.5)
WBC: 11.5 10*3/uL — AB (ref 4.0–10.5)
WBC: 12.4 10*3/uL — AB (ref 4.0–10.5)

## 2017-01-23 MED ORDER — POTASSIUM CHLORIDE CRYS ER 20 MEQ PO TBCR
40.0000 meq | EXTENDED_RELEASE_TABLET | Freq: Once | ORAL | Status: AC
  Administered 2017-01-23: 40 meq via ORAL
  Filled 2017-01-23: qty 2

## 2017-01-23 MED ORDER — DICYCLOMINE HCL 10 MG PO CAPS
10.0000 mg | ORAL_CAPSULE | Freq: Three times a day (TID) | ORAL | Status: DC
  Administered 2017-01-23 – 2017-01-26 (×9): 10 mg via ORAL
  Filled 2017-01-23 (×10): qty 1

## 2017-01-23 MED ORDER — TECHNETIUM TC 99M-LABELED RED BLOOD CELLS IV KIT
27.1000 | PACK | Freq: Once | INTRAVENOUS | Status: AC | PRN
  Administered 2017-01-23: 27.1 via INTRAVENOUS

## 2017-01-23 NOTE — Progress Notes (Signed)
[facility] PROGRESS NOTE  ANURADHA CHABOT WRU:[id number] DOB: Sep 28, 1941 DOA: 01/21/2017  PCP: Loura Pardon, MD  Brief History/Interval Summary: 76 year old African-American female with a past medical history of hypertension, rheumatoid arthritis, who was recently started on prednisone and methotrexate, history of GERD, anxiety, presented with rectal bleeding. She also had abdominal pain. Patient was hospitalized for further management.  Reason for Visit: Rectal bleeding  Consultants: Gastroenterology  Procedures: None yet  Antibiotics: None  Subjective/Interval History: Patient mentioned that she had an episode of blood per rectum overnight. Wasn't as large as she has previously had. Continues to have lower abdominal discomfort. Denies any nausea or vomiting.  ROS: Denies any chest pain. Mentions mild shortness of breath.  Objective:  Vital Signs  Vitals:   01/22/17 1711 01/22/17 2300 01/23/17 0650 01/23/17 0959  BP: (!) 128/92 (!) 125/56 (!) 147/67 (!) 142/54  Pulse: 96 86 83   Resp: 18 18 18    Temp: 98 F (36.7 C) 98.5 F (36.9 C) 98.1 F (36.7 C)   TempSrc: Oral Oral Oral   SpO2: 93% 98% 97%   Weight:   89.1 kg (196 lb 8 oz)   Height:        Intake/Output Summary (Last 24 hours) at 01/23/17 1144 Last data filed at 01/23/17 0904  Gross per 24 hour  Intake             2780 ml  Output              510 ml  Net             2270 ml   Filed Weights   01/21/17 1407 01/22/17 0512 01/23/17 0650  Weight: 89.9 kg (198 lb 4.8 oz) 90.7 kg (199 lb 14.4 oz) 89.1 kg (196 lb 8 oz)    General appearance: alert, cooperative, appears stated age and no distress Resp: clear to auscultation bilaterally Cardio: regular rate and rhythm, S1, S2 normal, no murmur, click, rub or gallop GI: Abdomen is soft. Mildly tender in the lower abdomen on both sides without any rebound, rigidity or guarding. No masses or organomegaly. Bowel sounds are present. Extremities: extremities  normal, atraumatic, no cyanosis or edema Neurologic: Awake and alert. Oriented 3. No focal neurological deficits are noted.  Lab Results:  Data Reviewed: I have personally reviewed following labs and imaging studies  CBC:  Recent Labs Lab 01/20/17 1148 01/21/17 0510 01/21/17 1510 01/22/17 0322 01/23/17 0438  WBC 12.6* 13.8* 10.4 8.9 11.5*  HGB 13.4 11.0* 8.7* 8.0* 7.9*  HCT 40.6 33.7* 26.5* 24.6* 24.6*  MCV 85.7 86.0 85.8 86.0 86.3  PLT 354 386 276 265 914    Basic Metabolic Panel:  Recent Labs Lab 01/20/17 1148 01/21/17 0510 01/22/17 0322 01/23/17 0438  NA 137 138 138 140  K 3.8 3.9 3.6 3.3*  CL 105 106 107 104  CO2 23 24 26 26   GLUCOSE 152* 195* 112* 119*  BUN 14 17 6  <5*  CREATININE 0.76 0.85 0.63 0.62  CALCIUM 8.9 8.8* 8.1* 8.4*    GFR: Estimated Creatinine Clearance: 69.3 mL/min (by C-G formula based on SCr of 0.62 mg/dL).  Liver Function Tests:  Recent Labs Lab 01/20/17 1148 01/21/17 0510  AST 22 25  ALT 17 16  ALKPHOS 66 60  BILITOT 0.5 0.4  PROT 7.1 6.5  ALBUMIN 3.5 3.2*    HbA1C:  Recent Labs  01/21/17 0724  HGBA1C 6.9*    CBG:  Recent Labs Lab 01/22/17 0757 01/22/17 1236  01/22/17 1624 01/22/17 2333 01/23/17 0752  GLUCAP 116* 148* 131* 130* 124*    Thyroid Function Tests:  Recent Labs  01/21/17 0743  TSH 0.685    Radiology Studies: No results found.   Medications:  Scheduled: . amLODipine  5 mg Oral Daily  . dicyclomine  10 mg Oral TID AC  . famotidine  10 mg Oral Daily  . feeding supplement  1 Container Oral TID BM  . fluticasone  1 spray Each Nare Daily  . folic acid  1 mg Oral Daily  . insulin aspart  0-15 Units Subcutaneous TID WC  . loratadine  10 mg Oral Daily  . pantoprazole  40 mg Oral Daily  . predniSONE  5 mg Oral Q breakfast  . sodium chloride flush  3 mL Intravenous Q12H   Continuous:  [facility]:[id number], albuterol, HYDROcodone-acetaminophen, ondansetron **OR** ondansetron (ZOFRAN) IV,  traMADol  Assessment/Plan:  Principal Problem:   BRBPR (bright red blood per rectum) Active Problems:   GERD   Essential hypertension   Adjustment disorder with mixed anxiety and depressed mood   Acute blood loss anemia   Diverticulosis   RA (rheumatoid arthritis) (HCC)    Rectal bleeding Most likely diverticular. Colonoscopy in 03/2016 showed 5 mm polyp in the sigmoid colon. The polyp was sessile. A few medium-mouthed diverticula were found in the sigmoid colon. Internal hemorrhoids were found during retroflexion. The exam was otherwise normal throughout the examined colon. Bleeding appears to have slowed down. Gastroenterology is following. Appreciate their input. May have to consider tagged RBC scan. Abdominal discomfort is most likely due to cramps. CT scan did not show any acute process in the abdomen.  Acute blood loss anemia Hemoglobin dropped significantly from 13-8. 7.9 this morning. We will recheck later today. Transfuse if drops to around 7 or below. Does have some dyspnea, which is most likely due to her anemia.  GERD Continue Pepcid and protonix  Essential hypertension Continue Norvasc 5 mg daily. Monitor blood pressure closely.  RA (rheumatoid arthritis) Continue low dose prednisone. Patient was recently started on methotrexate and prednisone by her rheumatologist.  Steroid induced hyperglycemia HbA1c is 6.9. Elevated blood glucose level secondary to recently initiated steroids. Continue just SSI for now. She can pursue this further with her outpatient providers. Discussed in detail with patient.  DVT Prophylaxis: SCDs    Code Status: Full code  Family Communication: Discussed with the patient and her husband  Disposition Plan: Management as outlined above.    LOS: 1 day   [facility] Pager [phone] 01/23/2017, 11:44 AM  If 7PM-7AM, please contact night-coverage at [email or web address], password [facility]

## 2017-01-23 NOTE — Progress Notes (Signed)
Daily Rounding Note  01/23/2017, 9:35 AM  LOS: 1 day   SUBJECTIVE:   Chief complaint: bloody stool.  abd pain.    Still has pain in lower abdomen, bil.  3 hematochezia in last 24 hours, dueing day/overnight/this AM.  Latest turned commode totally bloody.  Feels dizzy with standing.  No SOB or chest pain  OBJECTIVE:         Vital signs in last 24 hours:    Temp:  [98 F (36.7 C)-98.5 F (36.9 C)] 98.1 F (36.7 C) (04/06 0650) Pulse Rate:  [83-96] 83 (04/06 0650) Resp:  [18] 18 (04/06 0650) BP: (125-147)/(56-92) 147/67 (04/06 0650) SpO2:  [93 %-98 %] 97 % (04/06 0650) Weight:  [89.1 kg (196 lb 8 oz)] 89.1 kg (196 lb 8 oz) (04/06 0650) Last BM Date: 01/22/17 Filed Weights   01/21/17 1407 01/22/17 0512 01/23/17 0650  Weight: 89.9 kg (198 lb 4.8 oz) 90.7 kg (199 lb 14.4 oz) 89.1 kg (196 lb 8 oz)   General: pale (? natral coloring)   Heart: RRR.  Not tachy Chest: clear bil.  No labored resps Abdomen: soft, mild tenderness lower abd  Extremities: no CCE Neuro/Psych:  Oriented x 3.  No weakness or deficits  Intake/Output from previous day: 04/05 0701 - 04/06 0700 In: 3240 [P.O.:3240] Out: 500 [Urine:500]  Intake/Output this shift: No intake/output data recorded.  Lab Results:  Recent Labs  01/21/17 1510 01/22/17 0322 01/23/17 0438  WBC 10.4 8.9 11.5*  HGB 8.7* 8.0* 7.9*  HCT 26.5* 24.6* 24.6*  PLT 276 265 302   BMET  Recent Labs  01/21/17 0510 01/22/17 0322 01/23/17 0438  NA 138 138 140  K 3.9 3.6 3.3*  CL 106 107 104  CO2 24 26 26   GLUCOSE 195* 112* 119*  BUN 17 6 <5*  CREATININE 0.85 0.63 0.62  CALCIUM 8.8* 8.1* 8.4*   LFT  Recent Labs  01/20/17 1148 01/21/17 0510  PROT 7.1 6.5  ALBUMIN 3.5 3.2*  AST 22 25  ALT 17 16  ALKPHOS 66 60  BILITOT 0.5 0.4   PT/INR No results for input(s): LABPROT, INR in the last 72 hours. Hepatitis Panel No results for input(s): HEPBSAG, HCVAB,  HEPAIGM, HEPBIGM in the last 72 hours.  Studies/Results: No results found.   Scheduled Meds: . amLODipine  5 mg Oral Daily  . famotidine  10 mg Oral Daily  . feeding supplement  1 Container Oral TID BM  . fluticasone  1 spray Each Nare Daily  . folic acid  1 mg Oral Daily  . insulin aspart  0-15 Units Subcutaneous TID WC  . loratadine  10 mg Oral Daily  . pantoprazole  40 mg Oral Daily  . predniSONE  5 mg Oral Q breakfast  . sodium chloride flush  3 mL Intravenous Q12H   Continuous Infusions: PRN Meds:.acetaminophen, albuterol, HYDROcodone-acetaminophen, ondansetron **OR** ondansetron (ZOFRAN) IV, traMADol   ASSESMENT:   *  Hematochezia, abd pain.   Sigmoid diverticulae without diverticulitis or colitis on CT.  Regular screening colonoscopies, latest 03/2016, only ever had HP polyps, hemorrhoids and sigmoid diverticulosis.    *  Blood loss anemia.  No transfusions to date.  Hgb baseline ~ 14.    *  Leukocytosis, resolved.   *  Hx GERD on daily Nexium and BID Zantac at home.    PLAN   *  nuc RBC tagged scan this AM.  Ordered  *  Should  probably transfuse with PRBC x 1, but defer this for attending to decide.  CBC in AM.    *  Adding Bentyl.      [name]  01/23/2017, 9:35 AM Pager: [phone]   Attending physician's note   I have  reviewed the chart. I agree with the Advanced Practitioner's note, impression and recommendations.  Patient is in nuclear med for RBC tag scan, we'll request IR for angiogram embolization if able to localize site of bleed. Likely diverticular hemorrhage from sigmoid diverticulosis K Denzil Magnuson, MD [phone] Mon-Fri 8a-5p [phone] after 5p, weekends, holidays

## 2017-01-24 DIAGNOSIS — R1032 Left lower quadrant pain: Secondary | ICD-10-CM

## 2017-01-24 DIAGNOSIS — K5731 Diverticulosis of large intestine without perforation or abscess with bleeding: Secondary | ICD-10-CM

## 2017-01-24 LAB — CBC
HCT: 28 % — ABNORMAL LOW (ref 36.0–46.0)
HEMATOCRIT: 25.7 % — AB (ref 36.0–46.0)
Hemoglobin: 8.3 g/dL — ABNORMAL LOW (ref 12.0–15.0)
Hemoglobin: 9.2 g/dL — ABNORMAL LOW (ref 12.0–15.0)
MCH: 28.1 pg (ref 26.0–34.0)
MCH: 28.8 pg (ref 26.0–34.0)
MCHC: 32.3 g/dL (ref 30.0–36.0)
MCHC: 32.9 g/dL (ref 30.0–36.0)
MCV: 87.1 fL (ref 78.0–100.0)
MCV: 87.5 fL (ref 78.0–100.0)
PLATELETS: 336 10*3/uL (ref 150–400)
Platelets: 369 10*3/uL (ref 150–400)
RBC: 2.95 MIL/uL — ABNORMAL LOW (ref 3.87–5.11)
RBC: 3.2 MIL/uL — ABNORMAL LOW (ref 3.87–5.11)
RDW: 14.9 % (ref 11.5–15.5)
RDW: 14.9 % (ref 11.5–15.5)
WBC: 11.2 10*3/uL — AB (ref 4.0–10.5)
WBC: 11.4 10*3/uL — AB (ref 4.0–10.5)

## 2017-01-24 LAB — BASIC METABOLIC PANEL
ANION GAP: 9 (ref 5–15)
BUN: <5 mg/dL — ABNORMAL LOW (ref 6–20)
CALCIUM: 8.4 mg/dL — AB (ref 8.9–10.3)
CO2: 25 mmol/L (ref 22–32)
Chloride: 105 mmol/L (ref 101–111)
Creatinine, Ser: 0.68 mg/dL (ref 0.44–1.00)
Glucose, Bld: 108 mg/dL — ABNORMAL HIGH (ref 65–99)
Potassium: 3.4 mmol/L — ABNORMAL LOW (ref 3.5–5.1)
Sodium: 139 mmol/L (ref 135–145)

## 2017-01-24 LAB — MAGNESIUM: Magnesium: 1.9 mg/dL (ref 1.7–2.4)

## 2017-01-24 LAB — GLUCOSE, CAPILLARY
GLUCOSE-CAPILLARY: 121 mg/dL — AB (ref 65–99)
GLUCOSE-CAPILLARY: 143 mg/dL — AB (ref 65–99)
GLUCOSE-CAPILLARY: 187 mg/dL — AB (ref 65–99)
Glucose-Capillary: 107 mg/dL — ABNORMAL HIGH (ref 65–99)

## 2017-01-24 MED ORDER — POTASSIUM CHLORIDE CRYS ER 20 MEQ PO TBCR
40.0000 meq | EXTENDED_RELEASE_TABLET | Freq: Once | ORAL | Status: AC
  Administered 2017-01-24: 40 meq via ORAL
  Filled 2017-01-24: qty 2

## 2017-01-24 NOTE — Progress Notes (Signed)
[facility] PROGRESS NOTE  [name]:[id number] DOB: 1941/03/29 DOA: 01/21/2017  PCP: Loura Pardon, MD  Brief History/Interval Summary: 76 year old African-American female with a past medical history of hypertension, rheumatoid arthritis, who was recently started on prednisone and methotrexate, history of GERD, anxiety, presented with rectal bleeding. She also had abdominal pain. Patient was hospitalized for further management.  Reason for Visit: Rectal bleeding  Consultants: Gastroenterology  Procedures: None yet  Antibiotics: None  Subjective/Interval History: One episode of bright red blood per rectum this am, hemoglobin stable.    ROS: Denies any chest pain. Mentions mild shortness of breath.  Objective:  Vital Signs  Vitals:   01/23/17 0959 01/23/17 1957 01/24/17 0547 01/24/17 1238  BP: (!) 142/54 126/69 (!) 132/48 (!) 141/46  Pulse:  91 80 85  Resp:  18 18 20   Temp:  97.8 F (36.6 C) 97.9 F (36.6 C) 97.9 F (36.6 C)  TempSrc:  Oral Oral Oral  SpO2:  99% 98% 98%  Weight:   87.2 kg (192 lb 3.2 oz)   Height:        Intake/Output Summary (Last 24 hours) at 01/24/17 1825 Last data filed at 01/24/17 1800  Gross per 24 hour  Intake             1200 ml  Output                0 ml  Net             1200 ml   Filed Weights   01/22/17 0512 01/23/17 0650 01/24/17 0547  Weight: 90.7 kg (199 lb 14.4 oz) 89.1 kg (196 lb 8 oz) 87.2 kg (192 lb 3.2 oz)    General appearance: alert, cooperative, appears stated age and no distress Resp: clear to auscultation bilaterally Cardio: regular rate and rhythm, S1, S2 normal, no murmur, click, rub or gallop GI: Abdomen is soft. Mildly tender in the lower abdomen on both sides without any rebound, rigidity or guarding. No masses or organomegaly. Bowel sounds are present. Extremities: extremities normal, atraumatic, no cyanosis or edema Neurologic: Awake and alert. Oriented 3. No focal neurological deficits are  noted.  Lab Results:  Data Reviewed: I have personally reviewed following labs and imaging studies  CBC:  Recent Labs Lab 01/22/17 0322 01/23/17 0438 01/23/17 1544 01/24/17 0410 01/24/17 1547  WBC 8.9 11.5* 12.4* 11.2* 11.4*  HGB 8.0* 7.9* 8.6* 8.3* 9.2*  HCT 24.6* 24.6* 26.4* 25.7* 28.0*  MCV 86.0 86.3 87.1 87.1 87.5  PLT 265 302 358 336 314    Basic Metabolic Panel:  Recent Labs Lab 01/20/17 1148 01/21/17 0510 01/22/17 0322 01/23/17 0438 01/24/17 0410  NA 137 138 138 140 139  K 3.8 3.9 3.6 3.3* 3.4*  CL 105 106 107 104 105  CO2 23 24 26 26 25   GLUCOSE 152* 195* 112* 119* 108*  BUN 14 17 6  <5* <5*  CREATININE 0.76 0.85 0.63 0.62 0.68  CALCIUM 8.9 8.8* 8.1* 8.4* 8.4*  MG  --   --   --   --  1.9    GFR: Estimated Creatinine Clearance: 68.7 mL/min (by C-G formula based on SCr of 0.68 mg/dL).  Liver Function Tests:  Recent Labs Lab 01/20/17 1148 01/21/17 0510  AST 22 25  ALT 17 16  ALKPHOS 66 60  BILITOT 0.5 0.4  PROT 7.1 6.5  ALBUMIN 3.5 3.2*    HbA1C: No results for input(s): HGBA1C in the last 72 hours.  CBG:  Recent Labs Lab 01/23/17 1628 01/23/17 2057 01/24/17 0749 01/24/17 1152 01/24/17 1636  GLUCAP 181* 132* 121* 143* 187*    Thyroid Function Tests: No results for input(s): TSH, T4TOTAL, FREET4, T3FREE, THYROIDAB in the last 72 hours.  Radiology Studies: Nm Gi Blood Loss  Result Date: 01/23/2017 CLINICAL DATA:  Hematochezia, GI bleeding, bright red blood per rectum and abdominal pain for few days prior to admission, recent similar episode for which she was told she had a diverticular bleed, history of colonoscopy with prior polyp resection and note of diverticulosis as well as internal hemorrhoids. EXAM: NUCLEAR MEDICINE GASTROINTESTINAL BLEEDING SCAN TECHNIQUE: Sequential abdominal images were obtained following intravenous administration of Tc-36m labeled red blood cells. RADIOPHARMACEUTICALS:  27.1 mCi Tc-82m pertechnetate in-vitro  labeled autologous red cells. COMPARISON:  CT abdomen and pelvis 01/20/2017 FINDINGS: Normal blood pool distribution of labeled red cells. No abnormal gastrointestinal localization of red cells identified to suggest active gastrointestinal bleeding. IMPRESSION: Negative GI blood loss scintigraphy. Electronically Signed   By: Lavonia Dana M.D.   On: 01/23/2017 16:05     Medications:  Scheduled: . amLODipine  5 mg Oral Daily  . dicyclomine  10 mg Oral TID AC  . famotidine  10 mg Oral Daily  . feeding supplement  1 Container Oral TID BM  . fluticasone  1 spray Each Nare Daily  . folic acid  1 mg Oral Daily  . insulin aspart  0-15 Units Subcutaneous TID WC  . loratadine  10 mg Oral Daily  . pantoprazole  40 mg Oral Daily  . predniSONE  5 mg Oral Q breakfast  . sodium chloride flush  3 mL Intravenous Q12H   Continuous:  QZR:[id number], albuterol, HYDROcodone-acetaminophen, ondansetron **OR** ondansetron (ZOFRAN) IV, traMADol  Assessment/Plan:  Principal Problem:   BRBPR (bright red blood per rectum) Active Problems:   GERD   Essential hypertension   Adjustment disorder with mixed anxiety and depressed mood   Acute blood loss anemia   Diverticulosis   RA (rheumatoid arthritis) (HCC)   Hematochezia    Rectal bleeding Most likely diverticular. Colonoscopy in 03/2016 showed 5 mm polyp in the sigmoid colon. The polyp was sessile. A few medium-mouthed diverticula were found in the sigmoid colon. Internal hemorrhoids were found during retroflexion. The exam was otherwise normal throughout the examined colon. Bleeding appears to have slowed down. GI consulted, recommended, can go home in am, if bleeding stopped and h&H stable.  Advance diet in am to soft diet.   Acute blood loss anemia Hemoglobin dropped significantly from 13-8. 7.9 this morning. Repeat hemoglobin between 8 to 9.   GERD Continue Pepcid and protonix  Essential hypertension Continue Norvasc 5 mg daily. Well  controlled.   RA (rheumatoid arthritis) Continue low dose prednisone. Patient was recently started on methotrexate and prednisone by her rheumatologist.  Steroid induced hyperglycemia HbA1c is 6.9. Elevated blood glucose level secondary to recently initiated steroids.  CBG (last 3)   Recent Labs  01/24/17 0749 01/24/17 1152 01/24/17 1636  GLUCAP 121* 143* 187*    Would resume SSI. Might need low dose metformin on discharge.   DVT Prophylaxis: SCDs    Code Status: Full code  Family Communication: Discussed with the patient and her husband at bedside.  Disposition Plan: home in am.     LOS: 2 days   [place] [facility] Pager [phone] 01/24/2017, 6:25 PM  If 7PM-7AM, please contact night-coverage at [email or web address], password [facility]

## 2017-01-24 NOTE — Progress Notes (Signed)
Patient ID: [name], female   DOB: June 03, 1941, 76 y.o.   MRN: [id number]    Progress Note   Subjective   Feels Ok - had one bloody stool last night and smaller bloody stool this am . Denies lightheadedness-  She relates having bad abdominal  pain, and diaphoresis just prior to onset of bleeding 4/4- still having pain in lower abdomen , not severe   Objective   Vital signs in last 24 hours: Temp:  [97.8 F (36.6 C)-97.9 F (36.6 C)] 97.9 F (36.6 C) (04/07 0547) Pulse Rate:  [80-91] 80 (04/07 0547) Resp:  [18] 18 (04/07 0547) BP: (126-132)/(48-69) 132/48 (04/07 0547) SpO2:  [98 %-99 %] 98 % (04/07 0547) Weight:  [192 lb 3.2 oz (87.2 kg)] 192 lb 3.2 oz (87.2 kg) (04/07 0547) Last BM Date: 01/23/17 General:     AA  female in NAD Heart:  Regular rate and rhythm; no murmurs Lungs: Respirations even and unlabored, lungs CTA bilaterally Abdomen:  Soft, tender  LLQ and suprapubic,and nondistended. Normal bowel sounds. Extremities:  Without edema. Neurologic:  Alert and oriented,  grossly normal neurologically. Psych:  Cooperative. Normal mood and affect.  Intake/Output from previous day: 04/06 0701 - 04/07 0700 In: 860 [P.O.:860] Out: 510 [Urine:510] Intake/Output this shift: Total I/O In: 360 [P.O.:360] Out: -   Lab Results:  Recent Labs  01/23/17 0438 01/23/17 1544 01/24/17 0410  WBC 11.5* 12.4* 11.2*  HGB 7.9* 8.6* 8.3*  HCT 24.6* 26.4* 25.7*  PLT 302 358 336   BMET  Recent Labs  01/22/17 0322 01/23/17 0438 01/24/17 0410  NA 138 140 139  K 3.6 3.3* 3.4*  CL 107 104 105  CO2 26 26 25   GLUCOSE 112* 119* 108*  BUN 6 <5* <5*  CREATININE 0.63 0.62 0.68  CALCIUM 8.1* 8.4* 8.4*   LFT No results for input(s): PROT, ALBUMIN, AST, ALT, ALKPHOS, BILITOT, BILIDIR, IBILI in the last 72 hours. PT/INR No results for input(s): LABPROT, INR in the last 72 hours.  Studies/Results: Nm Gi Blood Loss  Result Date: 01/23/2017 CLINICAL DATA:  Hematochezia, GI  bleeding, bright red blood per rectum and abdominal pain for few days prior to admission, recent similar episode for which she was told she had a diverticular bleed, history of colonoscopy with prior polyp resection and note of diverticulosis as well as internal hemorrhoids. EXAM: NUCLEAR MEDICINE GASTROINTESTINAL BLEEDING SCAN TECHNIQUE: Sequential abdominal images were obtained following intravenous administration of Tc-57m labeled red blood cells. RADIOPHARMACEUTICALS:  27.1 mCi Tc-60m pertechnetate in-vitro labeled autologous red cells. COMPARISON:  CT abdomen and pelvis 01/20/2017 FINDINGS: Normal blood pool distribution of labeled red cells. No abnormal gastrointestinal localization of red cells identified to suggest active gastrointestinal bleeding. IMPRESSION: Negative GI blood loss scintigraphy. Electronically Signed   By: Lavonia Dana M.D.   On: 01/23/2017 16:05       Assessment / Plan:    #1 76 yo AA female with CAD with acute lower GI bleed - this has been associated with lower abdomina lpain, and leukocytosis which is more consistent with segmental ischemic  colitis - though not called on CT  ?ischemic colitis vs diverticular bleed - She is slowing down - hgb stable  Overnight  #2 anemia - secondary to acute blood loss - transfuse for hgb less than 8  Plan ; Full liquids  Continue serial hgb 's and transfuse if drops below 8 Do not plan colonoscopy at this time , had Colon 2017 with documented sigmoid diverticuli-  likewise is segmental ischemic colitis management is supportive -generallay with resolution of sxs within 4- 5 days .    Principal Problem:   BRBPR (bright red blood per rectum) Active Problems:   GERD   Essential hypertension   Adjustment disorder with mixed anxiety and depressed mood   Acute blood loss anemia   Diverticulosis   RA (rheumatoid arthritis) ([place])   Hematochezia     LOS: 2 days   Amy Esterwood  01/24/2017, 9:59 AM   Attending physician's note   I  have taken an interval history, reviewed the chart and examined the patient. I agree with the Advanced Practitioner's note, impression and recommendations.  Patient likely passing residual blood in the colon, hemoglobin remained stable Likely diverticular hemorrhage given she had an acute blood loss of approximately 5 units, she will also have additional mild ischemic colitis secondary to blood loss and hypovolemia Continue to monitor hemoglobin daily and transfuse as needed We'll hold off on endoscopic evaluation, patient had EGD and colonoscopy in June 2017  IV iron infusion as inpatient Advanced diet as tolerated Hemoglobin remained stable with no evidence of further GI bleed okay to discharge home later tomorrow Will need recheck of Hgb in 1 week after discharge Follow up in GI office next available appointment  K Denzil Magnuson, MD [phone] Mon-Fri 8a-5p [phone] after 5p, weekends, holidays

## 2017-01-24 NOTE — Progress Notes (Signed)
Pt has just one episode of bowel movement this am with little bit of blood, in clear liquid diet, tolerating well, no any complaints so far, will continue to monitor

## 2017-01-25 DIAGNOSIS — T380X5A Adverse effect of glucocorticoids and synthetic analogues, initial encounter: Secondary | ICD-10-CM

## 2017-01-25 DIAGNOSIS — R739 Hyperglycemia, unspecified: Secondary | ICD-10-CM

## 2017-01-25 LAB — CBC
HCT: 31.9 % — ABNORMAL LOW (ref 36.0–46.0)
HEMATOCRIT: 27.3 % — AB (ref 36.0–46.0)
HEMOGLOBIN: 8.7 g/dL — AB (ref 12.0–15.0)
Hemoglobin: 10.5 g/dL — ABNORMAL LOW (ref 12.0–15.0)
MCH: 27.8 pg (ref 26.0–34.0)
MCH: 28.8 pg (ref 26.0–34.0)
MCHC: 31.9 g/dL (ref 30.0–36.0)
MCHC: 32.9 g/dL (ref 30.0–36.0)
MCV: 87.2 fL (ref 78.0–100.0)
MCV: 87.4 fL (ref 78.0–100.0)
PLATELETS: 365 10*3/uL (ref 150–400)
Platelets: 339 10*3/uL (ref 150–400)
RBC: 3.13 MIL/uL — ABNORMAL LOW (ref 3.87–5.11)
RBC: 3.65 MIL/uL — ABNORMAL LOW (ref 3.87–5.11)
RDW: 15.7 % — ABNORMAL HIGH (ref 11.5–15.5)
RDW: 16.3 % — AB (ref 11.5–15.5)
WBC: 11.6 10*3/uL — ABNORMAL HIGH (ref 4.0–10.5)
WBC: 10 10*3/uL (ref 4.0–10.5)

## 2017-01-25 LAB — GLUCOSE, CAPILLARY
GLUCOSE-CAPILLARY: 163 mg/dL — AB (ref 65–99)
Glucose-Capillary: 113 mg/dL — ABNORMAL HIGH (ref 65–99)
Glucose-Capillary: 120 mg/dL — ABNORMAL HIGH (ref 65–99)
Glucose-Capillary: 204 mg/dL — ABNORMAL HIGH (ref 65–99)

## 2017-01-25 LAB — PREPARE RBC (CROSSMATCH)

## 2017-01-25 MED ORDER — DICYCLOMINE HCL 10 MG PO CAPS: 10.0000 mg | ORAL_CAPSULE | Freq: Three times a day (TID) | ORAL | 0 refills | Status: DC

## 2017-01-25 MED ORDER — TRAMADOL HCL 50 MG PO TABS: 50.0000 mg | ORAL_TABLET | Freq: Three times a day (TID) | ORAL | 0 refills | Status: DC | PRN

## 2017-01-25 MED ORDER — ACETAMINOPHEN 325 MG PO TABS
650.0000 mg | ORAL_TABLET | Freq: Once | ORAL | Status: AC
  Administered 2017-01-25: 650 mg via ORAL
  Filled 2017-01-25: qty 2

## 2017-01-25 MED ORDER — SODIUM CHLORIDE 0.9 % IV SOLN
Freq: Once | INTRAVENOUS | Status: AC
  Administered 2017-01-25: 10:00:00 via INTRAVENOUS

## 2017-01-25 MED ORDER — DOCUSATE SODIUM 100 MG PO CAPS: 100.0000 mg | ORAL_CAPSULE | Freq: Two times a day (BID) | ORAL | 0 refills | Status: DC

## 2017-01-25 NOTE — Progress Notes (Signed)
Pt tolerated 1 unit Blood transfusion, no any side effects, vitals stable, will continue to monitor the patient

## 2017-01-25 NOTE — Progress Notes (Signed)
Pt is getting first unit of blood right now, vitals stable, no any side effects noted first 15 min, no any other complain of pain and SOB, will continue to monitor.

## 2017-01-25 NOTE — Progress Notes (Signed)
Patient ID: [name], female   DOB: 1941-05-06, 76 y.o.   MRN: [id number]    Progress Note   Subjective   Feels weak and lightheaded when gets up - tried twice this am to get up and bathe and had to lay back down Eating, less lower abdominal l discomfort , still tender One BM this am - still bloody appearing   Objective   Vital signs in last 24 hours: Temp:  [97.9 F (36.6 C)-98.1 F (36.7 C)] 98 F (36.7 C) (04/08 0554) Pulse Rate:  [85-91] 86 (04/08 0554) Resp:  [18-20] 18 (04/08 0554) BP: (127-141)/(46-61) 133/61 (04/08 0554) SpO2:  [98 %] 98 % (04/08 0554) Weight:  [193 lb 11.2 oz (87.9 kg)] 193 lb 11.2 oz (87.9 kg) (04/08 0554) Last BM Date: 01/24/17 General:    AA  female in NAD Heart:  Regular rate and rhythm; systolic murmur Lungs: Respirations even and unlabored, lungs CTA bilaterally Abdomen:  Soft, tender  LLQ and suprapubic,and nondistended. Normal bowel sounds. Extremities:  Without edema. Neurologic:  Alert and oriented,  grossly normal neurologically. Psych:  Cooperative. Normal mood and affect.  Intake/Output from previous day: 04/07 0701 - 04/08 0700 In: 1400 [P.O.:1400] Out: 801 [Urine:800; Stool:1] Intake/Output this shift: Total I/O In: 240 [P.O.:240] Out: -   Lab Results:  Recent Labs  01/24/17 0410 01/24/17 1547 01/25/17 0344  WBC 11.2* 11.4* 11.6*  HGB 8.3* 9.2* 8.7*  HCT 25.7* 28.0* 27.3*  PLT 336 369 339   BMET  Recent Labs  01/23/17 0438 01/24/17 0410  NA 140 139  K 3.3* 3.4*  CL 104 105  CO2 26 25  GLUCOSE 119* 108*  BUN <5* <5*  CREATININE 0.62 0.68  CALCIUM 8.4* 8.4*   LFT No results for input(s): PROT, ALBUMIN, AST, ALT, ALKPHOS, BILITOT, BILIDIR, IBILI in the last 72 hours. PT/INR No results for input(s): LABPROT, INR in the last 72 hours.  Studies/Results: Nm Gi Blood Loss  Result Date: 01/23/2017 CLINICAL DATA:  Hematochezia, GI bleeding, bright red blood per rectum and abdominal pain for few days prior  to admission, recent similar episode for which she was told she had a diverticular bleed, history of colonoscopy with prior polyp resection and note of diverticulosis as well as internal hemorrhoids. EXAM: NUCLEAR MEDICINE GASTROINTESTINAL BLEEDING SCAN TECHNIQUE: Sequential abdominal images were obtained following intravenous administration of Tc-8m labeled red blood cells. RADIOPHARMACEUTICALS:  27.1 mCi Tc-32m pertechnetate in-vitro labeled autologous red cells. COMPARISON:  CT abdomen and pelvis 01/20/2017 FINDINGS: Normal blood pool distribution of labeled red cells. No abnormal gastrointestinal localization of red cells identified to suggest active gastrointestinal bleeding. IMPRESSION: Negative GI blood loss scintigraphy. Electronically Signed   By: Lavonia Dana M.D.   On: 01/23/2017 16:05       Assessment / Plan:    #1 76 yo AA female with acute lower GI bleed - unclear if diverticular, or combination of diverticular  And low grade segmental ischemic colitis - She continues to pass bloody stool 1-2 x daily Hgb stable overnight so likely old blood   #2 symptomatic anemia- lightheaded and weak when standing- hgb less than 9 - will give one unit blood today #3 CAD #4 RA   Plan; Transfuse x one today - may need to keep her hgb above 9 Continue observation  Home tomorrow if all stable Will arrange for follow up  HGB at our office  On Friday (pt of dr Fuller Plan)   Principal Problem:   BRBPR (bright  red blood per rectum) Active Problems:   GERD   Essential hypertension   Adjustment disorder with mixed anxiety and depressed mood   Acute blood loss anemia   Diverticulosis   RA (rheumatoid arthritis) ([place])   Hematochezia     LOS: 3 days   Amy Esterwood  01/25/2017, 9:33 AM   Attending physician's note   I have taken an interval history, reviewed the chart and examined the patient. I agree with the Advanced Practitioner's note, impression and recommendations.  Patient is likely passing  old residual blood in colon. Hemoglobin remained stable .May need PT eval to assist in discharge planning Please call with any questions  K Denzil Magnuson, MD [phone] Mon-Fri 8a-5p [phone] after 5p, weekends, holidays

## 2017-01-25 NOTE — Progress Notes (Signed)
[facility] PROGRESS NOTE  [name] WYO:[id number] DOB: 12/02/1940 DOA: 01/21/2017  PCP: Loura Pardon, MD  Brief History/Interval Summary: 76 year old African-American female with a past medical history of hypertension, rheumatoid arthritis, who was recently started on prednisone and methotrexate, history of GERD, anxiety, presented with rectal bleeding. She also had abdominal pain. Patient was hospitalized for further management.  Reason for Visit: Rectal bleeding  Consultants: Gastroenterology  Procedures: None yet  Antibiotics: None  Subjective/Interval History: Patient had an episode of bowel movement with small amount of blood this morning. Apparently felt weak and lightheaded when she tried to ambulate.   ROS: Denies any chest pain.   Objective:  Vital Signs  Vitals:   01/24/17 1238 01/24/17 2034 01/25/17 0035 01/25/17 0554  BP: (!) 141/46 133/61 (!) 127/52 133/61  Pulse: 85 91 85 86  Resp: 20 18  18   Temp: 97.9 F (36.6 C) 98.1 F (36.7 C)  98 F (36.7 C)  TempSrc: Oral Oral  Oral  SpO2: 98% 98%  98%  Weight:    87.9 kg (193 lb 11.2 oz)  Height:        Intake/Output Summary (Last 24 hours) at 01/25/17 1039 Last data filed at 01/25/17 0926  Gross per 24 hour  Intake             1280 ml  Output              801 ml  Net              479 ml   Filed Weights   01/23/17 0650 01/24/17 0547 01/25/17 0554  Weight: 89.1 kg (196 lb 8 oz) 87.2 kg (192 lb 3.2 oz) 87.9 kg (193 lb 11.2 oz)    General appearance: alert, cooperative, appears stated age and no distress Resp: clear to auscultation bilaterally Cardio: regular rate and rhythm, S1, S2 normal, no murmur, click, rub or gallop GI: Abdomen Remains soft. Mildly tender in the lower abdomen on both sides without any rebound, rigidity or guarding. No masses or organomegaly. Bowel sounds are present. Extremities: extremities normal, atraumatic, no cyanosis or edema Neurologic: Awake and alert. Oriented  3. No focal neurological deficits are noted.  Lab Results:  Data Reviewed: I have personally reviewed following labs and imaging studies  CBC:  Recent Labs Lab 01/23/17 0438 01/23/17 1544 01/24/17 0410 01/24/17 1547 01/25/17 0344  WBC 11.5* 12.4* 11.2* 11.4* 11.6*  HGB 7.9* 8.6* 8.3* 9.2* 8.7*  HCT 24.6* 26.4* 25.7* 28.0* 27.3*  MCV 86.3 87.1 87.1 87.5 87.2  PLT 302 358 336 369 774    Basic Metabolic Panel:  Recent Labs Lab 01/20/17 1148 01/21/17 0510 01/22/17 0322 01/23/17 0438 01/24/17 0410  NA 137 138 138 140 139  K 3.8 3.9 3.6 3.3* 3.4*  CL 105 106 107 104 105  CO2 23 24 26 26 25   GLUCOSE 152* 195* 112* 119* 108*  BUN 14 17 6  <5* <5*  CREATININE 0.76 0.85 0.63 0.62 0.68  CALCIUM 8.9 8.8* 8.1* 8.4* 8.4*  MG  --   --   --   --  1.9    GFR: Estimated Creatinine Clearance: 68.9 mL/min (by C-G formula based on SCr of 0.68 mg/dL).  Liver Function Tests:  Recent Labs Lab 01/20/17 1148 01/21/17 0510  AST 22 25  ALT 17 16  ALKPHOS 66 60  BILITOT 0.5 0.4  PROT 7.1 6.5  ALBUMIN 3.5 3.2*    CBG:  Recent Labs Lab 01/24/17 0749 01/24/17  1152 01/24/17 1636 01/24/17 2059 01/25/17 0742  GLUCAP 121* 143* 187* 107* 113*    Radiology Studies: Nm Gi Blood Loss  Result Date: 01/23/2017 CLINICAL DATA:  Hematochezia, GI bleeding, bright red blood per rectum and abdominal pain for few days prior to admission, recent similar episode for which she was told she had a diverticular bleed, history of colonoscopy with prior polyp resection and note of diverticulosis as well as internal hemorrhoids. EXAM: NUCLEAR MEDICINE GASTROINTESTINAL BLEEDING SCAN TECHNIQUE: Sequential abdominal images were obtained following intravenous administration of Tc-69m labeled red blood cells. RADIOPHARMACEUTICALS:  27.1 mCi Tc-71m pertechnetate in-vitro labeled autologous red cells. COMPARISON:  CT abdomen and pelvis 01/20/2017 FINDINGS: Normal blood pool distribution of labeled red cells.  No abnormal gastrointestinal localization of red cells identified to suggest active gastrointestinal bleeding. IMPRESSION: Negative GI blood loss scintigraphy. Electronically Signed   By: Lavonia Dana M.D.   On: 01/23/2017 16:05     Medications:  Scheduled: . amLODipine  5 mg Oral Daily  . dicyclomine  10 mg Oral TID AC  . famotidine  10 mg Oral Daily  . feeding supplement  1 Container Oral TID BM  . fluticasone  1 spray Each Nare Daily  . folic acid  1 mg Oral Daily  . insulin aspart  0-15 Units Subcutaneous TID WC  . loratadine  10 mg Oral Daily  . pantoprazole  40 mg Oral Daily  . predniSONE  5 mg Oral Q breakfast  . sodium chloride flush  3 mL Intravenous Q12H   Continuous:  HLK:TGYBWLSLHTDSK, albuterol, HYDROcodone-acetaminophen, ondansetron **OR** ondansetron (ZOFRAN) IV, traMADol  Assessment/Plan:  Principal Problem:   BRBPR (bright red blood per rectum) Active Problems:   GERD   Essential hypertension   Adjustment disorder with mixed anxiety and depressed mood   Acute blood loss anemia   Diverticulosis   RA (rheumatoid arthritis) ([facility])   Hematochezia    Rectal bleeding Most likely diverticular. Colonoscopy in 03/2016 showed 5 mm polyp in the sigmoid colon. The polyp was sessile. A few medium-mouthed diverticula were found in the sigmoid colon. Internal hemorrhoids were found during retroflexion. The exam was otherwise normal throughout the examined colon. Gastroenterology is following. Bleeding appears to have subsided and appears to be mostly old blood. However, patient appears to be symptomatic from her anemia. Diet was advanced today.   Acute blood loss anemia Hemoglobin dropped significantly. Has been stable for the last 3 days. Appears to have symptoms due to anemia. Transfusion ordered by gastroenterology.   GERD Continue Pepcid and protonix  Essential hypertension Continue Norvasc 5 mg daily. Well controlled.   RA (rheumatoid arthritis) Continue low  dose prednisone. Patient was recently started on methotrexate and prednisone by her rheumatologist.  Steroid induced hyperglycemia HbA1c is 6.9. Elevated blood glucose level secondary to recently initiated steroids. Continue SSI. Patient to discuss this further with her primary care provider.  DVT Prophylaxis: SCDs    Code Status: Full code  Family Communication: Discussed with the patient and her husband at bedside.  Disposition Plan: To be transfused today. GI recommends holding the patient's discharge till tomorrow.     LOS: 3 days   [facility] Pager [phone] 01/25/2017, 10:39 AM  If 7PM-7AM, please contact night-coverage at [email or web address], password [facility]

## 2017-01-26 ENCOUNTER — Other Ambulatory Visit: Payer: Self-pay

## 2017-01-26 DIAGNOSIS — K922 Gastrointestinal hemorrhage, unspecified: Secondary | ICD-10-CM

## 2017-01-26 DIAGNOSIS — F4323 Adjustment disorder with mixed anxiety and depressed mood: Secondary | ICD-10-CM

## 2017-01-26 DIAGNOSIS — K5733 Diverticulitis of large intestine without perforation or abscess with bleeding: Principal | ICD-10-CM

## 2017-01-26 LAB — BASIC METABOLIC PANEL
ANION GAP: 12 (ref 5–15)
BUN: 8 mg/dL (ref 6–20)
CHLORIDE: 102 mmol/L (ref 101–111)
CO2: 24 mmol/L (ref 22–32)
Calcium: 8.7 mg/dL — ABNORMAL LOW (ref 8.9–10.3)
Creatinine, Ser: 0.64 mg/dL (ref 0.44–1.00)
GFR calc Af Amer: >60 mL/min (ref >60)
GFR calc non Af Amer: >60 mL/min (ref >60)
GLUCOSE: 118 mg/dL — AB (ref 65–99)
Potassium: 3.4 mmol/L — ABNORMAL LOW (ref 3.5–5.1)
SODIUM: 138 mmol/L (ref 135–145)

## 2017-01-26 LAB — CBC
HCT: 30.8 % — ABNORMAL LOW (ref 36.0–46.0)
HEMOGLOBIN: 10.1 g/dL — AB (ref 12.0–15.0)
MCH: 28.5 pg (ref 26.0–34.0)
MCHC: 32.8 g/dL (ref 30.0–36.0)
MCV: 86.8 fL (ref 78.0–100.0)
Platelets: 324 10*3/uL (ref 150–400)
RBC: 3.55 MIL/uL — ABNORMAL LOW (ref 3.87–5.11)
RDW: 16.5 % — ABNORMAL HIGH (ref 11.5–15.5)
WBC: 9.6 10*3/uL (ref 4.0–10.5)

## 2017-01-26 LAB — GLUCOSE, CAPILLARY: GLUCOSE-CAPILLARY: 136 mg/dL — AB (ref 65–99)

## 2017-01-26 MED ORDER — BOOST / RESOURCE BREEZE PO LIQD: 1.0000 | Freq: Three times a day (TID) | ORAL | 0 refills | Status: DC

## 2017-01-26 NOTE — Discharge Summary (Signed)
Physician Discharge Summary  [name] TKW:[id number] DOB: [birth year] DOA: 01/21/2017  PCP: Loura Pardon, MD  Admit date: 01/21/2017 Discharge date: 01/26/2017  Admitted From: HOME Disposition: Home  Recommendations for Outpatient Follow-up:  1. Follow up with [facility] later this week for H&H monitoring. (Office will arrange follow-up)  Home Health: None Equipment/Devices: None  Discharge Condition: Fair CODE STATUS: Full code Diet recommendation: Low sodium     Discharge Diagnoses:  Principal Problem:   Diverticulitis of colon with bleeding   Active Problems:   BRBPR (bright red blood per rectum)   Acute blood loss anemia   GERD   Essential hypertension   Adjustment disorder with mixed anxiety and depressed mood   RA (rheumatoid arthritis) ([place])   Brief narrative/history of present illness Please refer to admission H&P for details, in brief, 76 year old African-American female with a past medical history of hypertension, rheumatoid arthritis, who was recently started on prednisone and methotrexate, history of GERD, anxiety, presented with rectal bleeding. She also had abdominal pain. Patient was hospitalized for further management.   Hospital course Rectal bleeding Most likely diverticular. Colonoscopy in 03/2016 showed 5 mm sessile polyp in the sigmoid colon. Along with few sigmoid diverticula and internal hemorrhoids. CT of the abdomen on admission was unremarkable. A tagged RBC scan was done which was also negative for source of bleeding. Patient received 2 units PRBC with improvement and stable H&H. Tolerating advanced diet and no further bleeding. Stable to be discharged home. Encouraged hydration and high-fiber diet. GI will arrange follow-up in the office later this week to monitor H&H.  Patient instructed to call her gastroenterologist or return to ED if she has further rectal bleed, worsening abdominal pain, hematemesis or melena.  Acute blood loss  anemia Has symptomatic anemia with significant drop in hemoglobin. Improved with 2 units PRBC.  GERD Continue Pepcid and protonix  Essential hypertension Stable. Continue home medication.  RA (rheumatoid arthritis) Continue low dose prednisone. Patient was recently started on methotrexate and prednisone by her rheumatologist.  Steroid induced hyperglycemia HbA1c is 6.9. Elevated blood glucose level secondary to recently initiated steroids. Follow-up with PCP  Procedure: CT of the abdomen GI bleeding scan  Consults: [facility]  Family Communication: Family at bedside Disposition Plan:  home    Discharge Instructions   Allergies as of 01/26/2017      Reactions   Bee Venom Anaphylaxis   Cetirizine Hcl    REACTION: does not work   Hctz [hydrochlorothiazide] Other (See Comments)   Felt weak all over  Felt weak all over       Medication List    TAKE these medications   acetaminophen 500 MG tablet Commonly known as:  TYLENOL Take 500 mg by mouth every 6 (six) hours as needed.   albuterol 108 (90 Base) MCG/ACT inhaler Commonly known as:  PROVENTIL HFA;VENTOLIN HFA Inhale 2 puffs into the lungs every 4 (four) hours as needed for wheezing or shortness of breath. Please give spacer if possible   amLODipine 5 MG tablet Commonly known as:  NORVASC Take 1 tablet (5 mg total) by mouth daily.   dicyclomine 10 MG capsule Commonly known as:  BENTYL Take 1 capsule (10 mg total) by mouth 3 (three) times daily before meals.   docusate sodium 100 MG capsule Commonly known as:  COLACE Take 1 capsule (100 mg total) by mouth 2 (two) times daily.   EPIPEN IJ Inject as directed as needed. Reported on 12/05/2015   esomeprazole 20 MG  capsule Commonly known as:  NEXIUM Take 20 mg by mouth daily at 12 noon.   feeding supplement Liqd Take 1 Container by mouth 3 (three) times daily between meals.   fluticasone 50 MCG/ACT nasal spray Commonly known as:  FLONASE PLACE 2  SPRAYS INTO THE NOSE DAILY.   folic acid 1 MG tablet Commonly known as:  FOLVITE Take 1 mg by mouth daily.   hydrocortisone 25 MG suppository Commonly known as:  ANUSOL-HC Place 1 suppository (25 mg total) rectally 2 (two) times daily. For 7 days   loratadine 10 MG tablet Commonly known as:  CLARITIN Take 10 mg by mouth daily.   methotrexate 2.5 MG tablet Commonly known as:  RHEUMATREX Take 15 mg by mouth once a week. Caution:Chemotherapy. Protect from light. ( Monday of each week )   MYLANTA PO Take 1 capsule by mouth as directed.   ondansetron 4 MG disintegrating tablet Commonly known as:  ZOFRAN-ODT TAKE 1 TABLET BY MOUTH EVERY 8 HOURS AS NEEDED FOR NAUSEA/VOMIT   polyethylene glycol packet Commonly known as:  MIRALAX / GLYCOLAX Take 17 g by mouth daily.   predniSONE 5 MG tablet Commonly known as:  DELTASONE Take 5 mg by mouth daily with breakfast.   ranitidine 150 MG capsule Commonly known as:  ZANTAC Take 1 capsule (150 mg total) by mouth 2 (two) times daily.   traMADol 50 MG tablet Commonly known as:  ULTRAM Take 1 tablet (50 mg total) by mouth every 8 (eight) hours as needed.      Follow-up [place], MD. Schedule an appointment as soon as possible for a visit in 2 week(s).   Specialties:  Family Medicine, Radiology Contact information: [address] [place] [address] [place] T. Fuller Plan, MD Follow up in 1 week(s).   Specialty:  Gastroenterology Why:  office will arrange f/up appt Contact information: [address] [address] [phone]          Allergies  Allergen Reactions  . Bee Venom Anaphylaxis  . Cetirizine Hcl     REACTION: does not work  . Hctz [Hydrochlorothiazide] Other (See Comments)    Felt weak all over  Felt weak all over         Procedures/Studies: Nm Gi Blood Loss  Result Date: 01/23/2017 CLINICAL DATA:  Hematochezia, GI bleeding, bright red blood per  rectum and abdominal pain for few days prior to admission, recent similar episode for which she was told she had a diverticular bleed, history of colonoscopy with prior polyp resection and note of diverticulosis as well as internal hemorrhoids. EXAM: NUCLEAR MEDICINE GASTROINTESTINAL BLEEDING SCAN TECHNIQUE: Sequential abdominal images were obtained following intravenous administration of Tc-39m labeled red blood cells. RADIOPHARMACEUTICALS:  27.1 mCi Tc-33m pertechnetate in-vitro labeled autologous red cells. COMPARISON:  CT abdomen and pelvis 01/20/2017 FINDINGS: Normal blood pool distribution of labeled red cells. No abnormal gastrointestinal localization of red cells identified to suggest active gastrointestinal bleeding. IMPRESSION: Negative GI blood loss scintigraphy. Electronically Signed   By: Lavonia Dana M.D.   On: 01/23/2017 16:05   Ct Abdomen Pelvis W Contrast  Result Date: 01/20/2017 CLINICAL DATA:  Lower abdomen pain, large amount of blood in stool today and EXAM: CT ABDOMEN AND PELVIS WITH CONTRAST TECHNIQUE: Multidetector CT imaging of the abdomen and pelvis was performed using the standard protocol following bolus administration of intravenous contrast. CONTRAST:  152mL ISOVUE-300 IOPAMIDOL (ISOVUE-300) INJECTION 61%  COMPARISON:  CT abdomen pelvis of 11/02/2015 FINDINGS: Lower chest: The lung bases clear. The heart is within normal limits in size. Hepatobiliary: The liver remains low in attenuation consistent with fatty infiltration. Surgical clips are noted from prior cholecystectomy. Pancreas: The pancreas is normal in size and the pancreatic duct is not dilated. Spleen: The spleen is normal in size. Adrenals/Urinary Tract: The adrenal glands are normal. The kidneys enhance with no calculus or mass and no hydronephrosis is seen. On delayed images, the pelvocaliceal systems are unremarkable. The ureters are normal in caliber. The urinary bladder is unremarkable. Stomach/Bowel: The stomach is  decompressed. No small bowel distention is seen. There are a few diverticula within the rectosigmoid colon. No colonic lesion is seen to explain the bright red blood per rectum. There is feces throughout the colon. The terminal ileum and the appendix are somewhat high in position but normal by CT. Vascular/Lymphatic: The abdominal aorta is normal in caliber with moderate abdominal aortic atherosclerosis present. No adenopathy is seen. Reproductive: The uterus is normal in size and calcified uterine fibroids are noted. No adnexal lesion is seen. No free fluid is noted within the pelvis. Other: None. Musculoskeletal: The lumbar vertebrae are in normal alignment with degenerative disc disease primarily at L5-S1, with degenerative changes in the facet joints of the lower lumbar spine as well. IMPRESSION: 1. No explanation for the blood per rectum is seen other than a few diverticula within the rectosigmoid colon. No mass is evident by CT. 2. Suspect fatty infiltration of the liver.  Correlate with LFTs. 3. Moderate abdominal aortic atherosclerosis. 4. Calcified uterine fibroids. Electronically Signed   By: Ivar Drape M.D.   On: 01/20/2017 15:16       Subjective: Feels better. Had a good bowel movement with a very small amount of bright red blood mixed.  Discharge Exam: Vitals:   01/25/17 2023 01/26/17 0600  BP: 140/60 (!) 154/62  Pulse: 90 82  Resp: 18 18  Temp: 98.7 F (37.1 C) 98 F (36.7 C)   Vitals:   01/25/17 1519 01/25/17 1720 01/25/17 2023 01/26/17 0600  BP: (!) 126/53 (!) 137/55 140/60 (!) 154/62  Pulse: 86 85 90 82  Resp: 18 18 18 18   Temp: 98 F (36.7 C) 98 F (36.7 C) 98.7 F (37.1 C) 98 F (36.7 C)  TempSrc: Oral Oral Oral Oral  SpO2: 98% 98% 98% 98%  Weight:    87.8 kg (193 lb 9.6 oz)  Height:        General: Elderly female not in distress HEENT: Moist mucosa, supple neck Chest: Clear to auscultation bilaterally CVS: Normal S1 and S2, no murmurs GI: Soft, nondistended  mild infraumbilical tenderness, bowel sounds present, musculoskeletal: Warm, no edema   The results of significant diagnostics from this hospitalization (including imaging, microbiology, ancillary and laboratory) are listed below for reference.     Microbiology: No results found for this or any previous visit (from the past 240 hour(s)).   Labs: BNP (last 3 results) No results for input(s): BNP in the last 8760 hours. Basic Metabolic Panel:  Recent Labs Lab 01/21/17 0510 01/22/17 0322 01/23/17 0438 01/24/17 0410 01/26/17 0459  NA 138 138 140 139 138  K 3.9 3.6 3.3* 3.4* 3.4*  CL 106 107 104 105 102  CO2 24 26 26 25 24   GLUCOSE 195* 112* 119* 108* 118*  BUN 17 6 <5* <5* 8  CREATININE 0.85 0.63 0.62 0.68 0.64  CALCIUM 8.8* 8.1* 8.4* 8.4* 8.7*  MG  --   --   --  1.9  --    Liver Function Tests:  Recent Labs Lab 01/20/17 1148 01/21/17 0510  AST 22 25  ALT 17 16  ALKPHOS 66 60  BILITOT 0.5 0.4  PROT 7.1 6.5  ALBUMIN 3.5 3.2*   No results for input(s): LIPASE, AMYLASE in the last 168 hours. No results for input(s): AMMONIA in the last 168 hours. CBC:  Recent Labs Lab 01/24/17 0410 01/24/17 1547 01/25/17 0344 01/25/17 2007 01/26/17 0459  WBC 11.2* 11.4* 11.6* 10.0 9.6  HGB 8.3* 9.2* 8.7* 10.5* 10.1*  HCT 25.7* 28.0* 27.3* 31.9* 30.8*  MCV 87.1 87.5 87.2 87.4 86.8  PLT 336 369 339 365 324   Cardiac Enzymes: No results for input(s): CKTOTAL, CKMB, CKMBINDEX, TROPONINI in the last 168 hours. BNP: Invalid input(s): POCBNP CBG:  Recent Labs Lab 01/25/17 0742 01/25/17 1129 01/25/17 1639 01/25/17 2048 01/26/17 0828  GLUCAP 113* 163* 120* 204* 136*   D-Dimer No results for input(s): DDIMER in the last 72 hours. Hgb A1c No results for input(s): HGBA1C in the last 72 hours. Lipid Profile No results for input(s): CHOL, HDL, LDLCALC, TRIG, CHOLHDL, LDLDIRECT in the last 72 hours. Thyroid function studies No results for input(s): TSH, T4TOTAL, T3FREE,  THYROIDAB in the last 72 hours.  Invalid input(s): FREET3 Anemia work up No results for input(s): VITAMINB12, FOLATE, FERRITIN, TIBC, IRON, RETICCTPCT in the last 72 hours. Urinalysis    Component Value Date/Time   COLORURINE YELLOW 01/21/2017 1214   APPEARANCEUR HAZY (A) 01/21/2017 1214   LABSPEC 1.031 (H) 01/21/2017 1214   PHURINE 5.0 01/21/2017 1214   GLUCOSEU NEGATIVE 01/21/2017 1214   HGBUR NEGATIVE 01/21/2017 1214   BILIRUBINUR NEGATIVE 01/21/2017 1214   BILIRUBINUR Negative 02/15/2016 1116   KETONESUR NEGATIVE 01/21/2017 1214   PROTEINUR NEGATIVE 01/21/2017 1214   UROBILINOGEN 0.2 02/15/2016 1116   NITRITE NEGATIVE 01/21/2017 1214   LEUKOCYTESUR NEGATIVE 01/21/2017 1214   Sepsis Labs Invalid input(s): PROCALCITONIN,  WBC,  LACTICIDVEN Microbiology No results found for this or any previous visit (from the past 240 hour(s)).   Time coordinating discharge: Over 30 minutes  SIGNED:   Louellen Molder, MD  [facility] 01/26/2017, 9:35 AM Pager   If 7PM-7AM, please contact night-coverage [email or web address] Password [email or web address]

## 2017-01-26 NOTE — Discharge Instructions (Addendum)
Lower Gastrointestinal Bleeding °Lower gastrointestinal (GI) bleeding is the result of bleeding from the colon, rectum, or anal area. The colon is the last part of the digestive tract, where stool, also called feces, is formed. If you have lower GI bleeding, you may see blood in or on your stool. It may be bright red. °Lower GI bleeding often stops without treatment. Continued or heavy bleeding needs emergency treatment at the hospital. °What are the causes? °Lower GI bleeding may be caused by: °· A condition that causes pouches to form in the colon over time (diverticulosis). °· Swelling and irritation (inflammation) in areas with diverticulosis (diverticulitis). °· Inflammation of the colon (inflammatory bowel disease). °· Swollen veins in the rectum (hemorrhoids). °· Painful tears in the anus (anal fissures), often caused by passing hard stools. °· Cancer of the colon or rectum. °· Noncancerous growths (polyps) of the colon or rectum. °· A bleeding disorder that impairs the formation of blood clots and causes easy bleeding (coagulopathy). °· An abnormal weakening of a blood vessel where an artery and a vein come together (arteriovenous malformation). °What increases the risk? °You are more likely to develop this condition if: °· You are older than 75 years of age. °· You take aspirin or NSAIDs on a regular basis. °· You take anticoagulant or antiplatelet drugs. °· You have a history of high-dose X-ray treatment (radiation therapy) of the colon. °· You recently had a colon polyp removed. °What are the signs or symptoms? °Symptoms of this condition include: °· Bright red blood or blood clots coming from your rectum. °· Bloody stools. °· Black or maroon-colored stools. °· Pain or cramping in the abdomen. °· Weakness or dizziness. °· Racing heartbeat. °How is this diagnosed? °This condition may be diagnosed based on: °· Your symptoms and medical history. °· A physical exam. During the exam, your health care provider  will check for signs of blood loss, such as low blood pressure and a rapid pulse. °· Tests, such as: °¨ Flexible sigmoidoscopy. In this procedure, a flexible tube with a camera on the end is used to examine your anus and the first part of your colon to look for the source of bleeding. °¨ Colonoscopy. This is similar to a flexible sigmoidoscopy, but the camera can extend all the way to the uppermost part of your colon. °¨ Blood tests to measure your red blood cell count and to check for coagulopathy. °¨ An imaging study of your colon to look for a bleeding site. In some cases, you may have X-rays taken after a dye or radioactive substance is injected into your bloodstream (angiogram). °How is this treated? °Treatment for this condition depends on the cause of the bleeding. Heavy or persistent bleeding is treated at the hospital. Treatment may include: °· Getting fluids through an IV tube inserted into one of your veins. °· Getting blood through an IV tube (blood transfusion). °· Stopping bleeding through high-heat coagulation, injections of certain medicines, or applying surgical clips. This can all be done during a colonoscopy. °· Having a procedure that involves first doing an angiogram and then blocking blood flow to the bleeding site (embolization). °· Stopping some of your regular medicines for a certain amount of time. °· Having surgery to remove part of the colon. This may be needed if bleeding is severe and does not respond to other treatment. °Follow these instructions at home: °· Take over-the-counter and prescription medicines only as told by your health care provider. You may need to avoid   aspirin, NSAIDs, or other medicines that increase bleeding. °· Eat foods that are high in fiber. This will help keep your stools soft. These foods include whole grains, legumes, fruits, and vegetables. Eating 1-3 prunes each day works well for many people. °· Drink enough fluid to keep your urine clear or pale  yellow. °· Keep all follow-up visits as told by your health care provider. This is important. °Contact a health care provider if: °· Your symptoms do not improve. °Get help right away if: °· Your bleeding increases. °· You feel light-headed or you faint. °· You feel weak. °· You have severe cramps in your back or abdomen. °· You pass large blood clots in your stool. °· Your symptoms get worse. °This information is not intended to replace advice given to you by your health care provider. Make sure you discuss any questions you have with your health care provider. °Document Released: 02/21/2016 Document Revised: 03/13/2016 Document Reviewed: 02/21/2016 °Elsevier Interactive Patient Education © 2017 [employer] ° °

## 2017-01-27 ENCOUNTER — Telehealth: Payer: Self-pay | Admitting: *Deleted

## 2017-01-27 LAB — TYPE AND SCREEN
ABO/RH(D): O POS
Antibody Screen: NEGATIVE
UNIT DIVISION: 0
UNIT DIVISION: 0
Unit division: 0

## 2017-01-27 LAB — BPAM RBC
BLOOD PRODUCT EXPIRATION DATE: 201805032359
Blood Product Expiration Date: 201805032359
Blood Product Expiration Date: 201805042359
ISSUE DATE / TIME: 201804081447
UNIT TYPE AND RH: 5100
Unit Type and Rh: 5100
Unit Type and Rh: 5100

## 2017-01-27 NOTE — Telephone Encounter (Signed)
Transition Care Management Follow-up Telephone Call   Date discharged? 01/26/17   How have you been since you were released from the hospital? "still a little weak"   Do you understand why you were in the hospital? yes   Do you understand the discharge instructions? yes   Where were you discharged to? home   Items Reviewed:  Medications reviewed: yes  Allergies reviewed: yes  Dietary changes reviewed: yes  Referrals reviewed: yes   Functional Questionnaire:   Activities of Daily Living (ADLs):   She states they are independent in the following: all States they require assistance with the following: n/a pt is independent   Any transportation issues/concerns?: no, to be brought to appts by her daughter   Any patient concerns? no   Confirmed importance and date/time of follow-up visits scheduled yes  Provider Appointment booked with Dr Glori Bickers 4/17 @1615   Confirmed with patient if condition begins to worsen call PCP or go to the ER.  Patient was given the office number and encouraged to call back with question or concerns.  : yes

## 2017-02-03 ENCOUNTER — Encounter: Payer: Self-pay | Admitting: Family Medicine

## 2017-02-03 ENCOUNTER — Ambulatory Visit (INDEPENDENT_AMBULATORY_CARE_PROVIDER_SITE_OTHER): Payer: Medicare Other | Admitting: Family Medicine

## 2017-02-03 VITALS — BP 124/62 | HR 100 | Temp 98.7°F | Ht 66.25 in | Wt 196.0 lb

## 2017-02-03 DIAGNOSIS — D62 Acute posthemorrhagic anemia: Secondary | ICD-10-CM

## 2017-02-03 DIAGNOSIS — I700 Atherosclerosis of aorta: Secondary | ICD-10-CM | POA: Diagnosis not present

## 2017-02-03 DIAGNOSIS — K5733 Diverticulitis of large intestine without perforation or abscess with bleeding: Secondary | ICD-10-CM

## 2017-02-03 DIAGNOSIS — I10 Essential (primary) hypertension: Secondary | ICD-10-CM

## 2017-02-03 DIAGNOSIS — M069 Rheumatoid arthritis, unspecified: Secondary | ICD-10-CM

## 2017-02-03 NOTE — Progress Notes (Signed)
Subjective:    Patient ID: [name], female    DOB: Apr 24, 1941, 76 y.o.   MRN: [id number]  HPI Here for f/u of hospitalization from 4/4 to 01/26/17 for diverticulitis with GI bleed She was admitted for BRBPR with acute blood loss anemia   CT scan of abdomen on admission was unremarkable  A tagged rbc scan was neg for source of bleeding  She received 2 U PRBC with imp nad stable Hand H  HTN remained stable  RA remained stable   Steroid induced hyperglycemia was noted  Lab Results  Component Value Date   HGBA1C 6.9 (H) 01/21/2017    BP Readings from Last 3 Encounters:  02/03/17 124/62  01/26/17 (!) 154/62  01/20/17 (!) 162/80    Lab Results  Component Value Date   WBC 9.6 01/26/2017   HGB 10.1 (L) 01/26/2017   HCT 30.8 (L) 01/26/2017   MCV 86.8 01/26/2017   PLT 324 01/26/2017   much improved from 8.3     Chemistry      Component Value Date/Time   NA 138 01/26/2017 0459   K 3.4 (L) 01/26/2017 0459   CL 102 01/26/2017 0459   CO2 24 01/26/2017 0459   BUN 8 01/26/2017 0459   CREATININE 0.64 01/26/2017 0459      Component Value Date/Time   CALCIUM 8.7 (L) 01/26/2017 0459   ALKPHOS 60 01/21/2017 0510   AST 25 01/21/2017 0510   ALT 16 01/21/2017 0510   BILITOT 0.4 01/21/2017 0510     Nm Gi Blood Loss  Result Date: 01/23/2017 CLINICAL DATA:  Hematochezia, GI bleeding, bright red blood per rectum and abdominal pain for few days prior to admission, recent similar episode for which she was told she had a diverticular bleed, history of colonoscopy with prior polyp resection and note of diverticulosis as well as internal hemorrhoids. EXAM: NUCLEAR MEDICINE GASTROINTESTINAL BLEEDING SCAN TECHNIQUE: Sequential abdominal images were obtained following intravenous administration of Tc-22m labeled red blood cells. RADIOPHARMACEUTICALS:  27.1 mCi Tc-74m pertechnetate in-vitro labeled autologous red cells. COMPARISON:  CT abdomen and pelvis 01/20/2017 FINDINGS: Normal blood  pool distribution of labeled red cells. No abnormal gastrointestinal localization of red cells identified to suggest active gastrointestinal bleeding. IMPRESSION: Negative GI blood loss scintigraphy. Electronically Signed   By: Lavonia Dana M.D.   On: 01/23/2017 16:05   Ct Abdomen Pelvis W Contrast  Result Date: 01/20/2017 CLINICAL DATA:  Lower abdomen pain, large amount of blood in stool today and EXAM: CT ABDOMEN AND PELVIS WITH CONTRAST TECHNIQUE: Multidetector CT imaging of the abdomen and pelvis was performed using the standard protocol following bolus administration of intravenous contrast. CONTRAST:  140mL ISOVUE-300 IOPAMIDOL (ISOVUE-300) INJECTION 61% COMPARISON:  CT abdomen pelvis of 11/02/2015 FINDINGS: Lower chest: The lung bases clear. The heart is within normal limits in size. Hepatobiliary: The liver remains low in attenuation consistent with fatty infiltration. Surgical clips are noted from prior cholecystectomy. Pancreas: The pancreas is normal in size and the pancreatic duct is not dilated. Spleen: The spleen is normal in size. Adrenals/Urinary Tract: The adrenal glands are normal. The kidneys enhance with no calculus or mass and no hydronephrosis is seen. On delayed images, the pelvocaliceal systems are unremarkable. The ureters are normal in caliber. The urinary bladder is unremarkable. Stomach/Bowel: The stomach is decompressed. No small bowel distention is seen. There are a few diverticula within the rectosigmoid colon. No colonic lesion is seen to explain the bright red blood per rectum. There is feces  throughout the colon. The terminal ileum and the appendix are somewhat high in position but normal by CT. Vascular/Lymphatic: The abdominal aorta is normal in caliber with moderate abdominal aortic atherosclerosis present. No adenopathy is seen. Reproductive: The uterus is normal in size and calcified uterine fibroids are noted. No adnexal lesion is seen. No free fluid is noted within the  pelvis. Other: None. Musculoskeletal: The lumbar vertebrae are in normal alignment with degenerative disc disease primarily at L5-S1, with degenerative changes in the facet joints of the lower lumbar spine as well. IMPRESSION: 1. No explanation for the blood per rectum is seen other than a few diverticula within the rectosigmoid colon. No mass is evident by CT. 2. Suspect fatty infiltration of the liver.  Correlate with LFTs. 3. Moderate abdominal aortic atherosclerosis. 4. Calcified uterine fibroids. Electronically Signed   By: Ivar Drape M.D.   On: 01/20/2017 15:16    Aortic atherosclerosis is noted   Still quite wiped out  No more bleeding Some low abdominal discomfort   She is avoiding nuts and seeds  She had been eating peanuts and cucumbers   She needs to follow up with GI doctor   Wt Readings from Last 3 Encounters:  02/03/17 196 lb (88.9 kg)  01/26/17 193 lb 9.6 oz (87.8 kg)  10/28/16 203 lb 8 oz (92.3 kg)   She is happy with wt loss  Appetite is not completely back   Has rheumatology f/u upcomign   Patient Active Problem List   Diagnosis Date Noted  . Aortic atherosclerosis ([place]) 02/03/2017  . Diverticulitis of colon with bleeding 01/26/2017  . BRBPR (bright red blood per rectum) 01/21/2017  . Acute blood loss anemia 01/21/2017  . RA (rheumatoid arthritis) ([place])   . Diabetes mellitus with complication ([place])   . Adjustment disorder with mixed anxiety and depressed mood 06/24/2016  . Essential hypertension 01/07/2016  . Coronary atherosclerosis 11/07/2015  . GERD 07/09/2007   Past Medical History:  Diagnosis Date  . Allergic rhinitis   . Anxiety   . Cataract 2015   bilateral  . Diabetes mellitus with complication ([place])   . Diverticulosis   . Esophageal reflux   . Essential hypertension 01/07/2016  . Fatty liver 2018  . Fibula fracture 02/2005  . Former smoker   . Gastritis 2003  . Heart murmur   . Hemorrhoids   . History of nephrolithiasis   . Hyperglycemia    . Hyperlipemia   . Osteoarthritis   . Personal history of colonic polyps 1998   hyperplastic  . RA (rheumatoid arthritis) ([place])   . Shingles 2012  . Uterine fibroid    Past Surgical History:  Procedure Laterality Date  . CHOLECYSTECTOMY    . COLONOSCOPY  11/08   internal hemorrhoids  . EP procedure  1994   SVT; normal echo  . ESOPHAGOGASTRODUODENOSCOPY  11/03   reactive gastropathy  . LIPOMA EXCISION  11/2002  . SVT s/p surgery--? ablation    . UPPER GASTROINTESTINAL ENDOSCOPY    . vaginal polyp excised  12/2000   benign   Social History  Substance Use Topics  . Smoking status: Former Smoker    Quit date: 10/21/2003  . Smokeless tobacco: Never Used  . Alcohol use No   Family History  Problem Relation Age of Onset  . Colon cancer Father 66  . Diabetes Mother   . Heart disease Mother   . Kidney disease Mother     ESRD  . Stroke Mother   .  Hypertension Brother   . Breast cancer Sister   . Diabetes Other     nephews  . Colon cancer Sister   . Esophageal cancer Neg Hx   . Stomach cancer Neg Hx   . Rectal cancer Neg Hx    Allergies  Allergen Reactions  . Bee Venom Anaphylaxis  . Cetirizine Hcl     REACTION: does not work  . Hctz [Hydrochlorothiazide] Other (See Comments)    Felt weak all over  Felt weak all over    Current Outpatient Prescriptions on File Prior to Visit  Medication Sig Dispense Refill  . acetaminophen (TYLENOL) 500 MG tablet Take 500 mg by mouth every 6 (six) hours as needed.    Marland Kitchen albuterol (PROVENTIL HFA;VENTOLIN HFA) 108 (90 Base) MCG/ACT inhaler Inhale 2 puffs into the lungs every 4 (four) hours as needed for wheezing or shortness of breath. Please give spacer if possible 1 Inhaler 1  . amLODipine (NORVASC) 5 MG tablet Take 1 tablet (5 mg total) by mouth daily. 90 tablet 3  . Calcium & Magnesium Carbonates (MYLANTA PO) Take 1 capsule by mouth as directed.      . dicyclomine (BENTYL) 10 MG capsule Take 1 capsule (10 mg total) by mouth 3 (three)  times daily before meals. 30 capsule 0  . docusate sodium (COLACE) 100 MG capsule Take 1 capsule (100 mg total) by mouth 2 (two) times daily. 60 capsule 0  . EPINEPHrine (EPIPEN IJ) Inject as directed as needed. Reported on 12/05/2015    . esomeprazole (NEXIUM) 20 MG capsule Take 20 mg by mouth daily at 12 noon.    . fluticasone (FLONASE) 50 MCG/ACT nasal spray PLACE 2 SPRAYS INTO THE NOSE DAILY. 48 g 3  . folic acid (FOLVITE) 1 MG tablet Take 1 mg by mouth daily.    . hydrocortisone (ANUSOL-HC) 25 MG suppository Place 1 suppository (25 mg total) rectally 2 (two) times daily. For 7 days 14 suppository 0  . loratadine (CLARITIN) 10 MG tablet Take 10 mg by mouth daily.    . methotrexate (RHEUMATREX) 2.5 MG tablet Take 15 mg by mouth once a week. Caution:Chemotherapy. Protect from light. ( Monday of each week )    . polyethylene glycol (MIRALAX / GLYCOLAX) packet Take 17 g by mouth daily.    . predniSONE (DELTASONE) 5 MG tablet Take 5 mg by mouth daily with breakfast.    . traMADol (ULTRAM) 50 MG tablet Take 1 tablet (50 mg total) by mouth every 8 (eight) hours as needed. 15 tablet 0  . feeding supplement (BOOST / RESOURCE BREEZE) LIQD Take 1 Container by mouth 3 (three) times daily between meals. (Patient not taking: Reported on 02/03/2017) 90 Container 0   Current Facility-Administered Medications on File Prior to Visit  Medication Dose Route Frequency Provider Last Rate Last Dose  . lidocaine (PF) (XYLOCAINE) 1 % injection 0.3 mL  0.3 mL Other Once Magnus Sinning, MD         Review of Systems Review of Systems  Constitutional: Negative for fever, appetite change,  and unexpected weight change.  Eyes: Negative for pain and visual disturbance.  Respiratory: Negative for cough and shortness of breath.   Cardiovascular: Negative for cp or palpitations    Gastrointestinal: Negative for nausea, diarrhea and pos for occ constipation. neg for melana or hematochezia , pos for low abd bloating and  discomfort  Genitourinary: Negative for urgency and frequency.  Skin: Negative for pallor or rash   MSK pos for  joint pain and stiffness Neurological: Negative for weakness, light-headedness, numbness and headaches.  Hematological: Negative for adenopathy. Does not bruise/bleed easily.  Psychiatric/Behavioral: Negative for dysphoric mood. The patient is not nervous/anxious.         Objective:   Physical Exam  Constitutional: She appears well-developed and well-nourished. No distress.  HENT:  Head: Normocephalic and atraumatic.  Mouth/Throat: Oropharynx is clear and moist.  Eyes: Conjunctivae and EOM are normal. Pupils are equal, round, and reactive to light. No scleral icterus.  Neck: Normal range of motion. Neck supple. No JVD present. Carotid bruit is not present. No thyromegaly present.  Cardiovascular: Normal rate, regular rhythm, normal heart sounds and intact distal pulses.  Exam reveals no gallop.   Pulmonary/Chest: Effort normal and breath sounds normal. No respiratory distress. She has no wheezes. She has no rales.  No crackles  Abdominal: Soft. Bowel sounds are normal. She exhibits no distension, no pulsatile liver, no abdominal bruit, no pulsatile midline mass and no mass. There is tenderness in the right lower quadrant and left lower quadrant. There is no rigidity, no rebound, no guarding, no CVA tenderness, no tenderness at McBurney's point and negative Murphy's sign.  Musculoskeletal: She exhibits no edema.  Lymphadenopathy:    She has no cervical adenopathy.  Neurological: She is alert. She has normal reflexes. No cranial nerve deficit. She exhibits normal muscle tone. Coordination normal.  Skin: Skin is warm and dry. No rash noted. No erythema. No pallor.  Psychiatric: She has a normal mood and affect.          Assessment & Plan:   Problem List Items Addressed This Visit      Cardiovascular and Mediastinum   Aortic atherosclerosis ([place])    Incidental finding on  CT Disc imp of cholesterol and bp control      Essential hypertension    bp in fair control at this time  BP Readings from Last 1 Encounters:  02/03/17 124/62   No changes needed Disc lifstyle change with low sodium diet and exercise  Labs reviewed  Stable while in the hospital        Digestive   Diverticulitis of colon with bleeding - Primary    Reviewed hospital records, lab results and studies in detail  Improved  Cbc today with ferritin  Some abd discomfort  Ref to GI Counseled to avoid nuts/seeds/corn and adv diet as tolerated  Adv activity as tol  If symptoms return (bleeding)- ED is advised       Relevant Orders   Ambulatory referral to Gastroenterology     Musculoskeletal and Integument   RA (rheumatoid arthritis) ([place])    Flared while in hospital Pt has rheum f/u planned         Other   Acute blood loss anemia    From diverticular bleed Gi ref done  Improved with 2 U transfusion Reviewed hospital records, lab results and studies in detail   Not on iron  Cbc and ferritin and bmet today      Relevant Orders   CBC with Differential/Platelet   Basic metabolic panel   Ferritin

## 2017-02-03 NOTE — Patient Instructions (Addendum)
Avoid nuts/seeds/corn and popcorn   Labs today to re check anemia  Try to eat a balanced diet and keep up your fluids  If abdominal pain worsens or if bleeding returns-alert [place] immediately and go to ER immediately   Take care of yourself  Gradually increase your activity   We will call you regarding GI referral and appointment

## 2017-02-03 NOTE — Assessment & Plan Note (Signed)
bp in fair control at this time  BP Readings from Last 1 Encounters:  02/03/17 124/62   No changes needed Disc lifstyle change with low sodium diet and exercise  Labs reviewed  Stable while in the hospital

## 2017-02-03 NOTE — Assessment & Plan Note (Signed)
Flared while in hospital Pt has rheum f/u planned

## 2017-02-03 NOTE — Assessment & Plan Note (Signed)
Reviewed hospital records, lab results and studies in detail  Improved  Cbc today with ferritin  Some abd discomfort  Ref to GI Counseled to avoid nuts/seeds/corn and adv diet as tolerated  Adv activity as tol  If symptoms return (bleeding)- ED is advised

## 2017-02-03 NOTE — Assessment & Plan Note (Signed)
From diverticular bleed Gi ref done  Improved with 2 U transfusion Reviewed hospital records, lab results and studies in detail   Not on iron  Cbc and ferritin and bmet today

## 2017-02-03 NOTE — Assessment & Plan Note (Signed)
Incidental finding on CT Disc imp of cholesterol and bp control

## 2017-02-03 NOTE — Progress Notes (Signed)
Pre visit review using our clinic review tool, if applicable. No additional management support is needed unless otherwise documented below in the visit note. 

## 2017-02-04 LAB — BASIC METABOLIC PANEL
BUN: 8 mg/dL (ref 6–23)
CHLORIDE: 102 meq/L (ref 96–112)
CO2: 26 meq/L (ref 19–32)
CREATININE: 0.74 mg/dL (ref 0.40–1.20)
Calcium: 9.5 mg/dL (ref 8.4–10.5)
GFR: 98.46 mL/min (ref >60.00)
Glucose, Bld: 129 mg/dL — ABNORMAL HIGH (ref 70–99)
POTASSIUM: 3.9 meq/L (ref 3.5–5.1)
SODIUM: 138 meq/L (ref 135–145)

## 2017-02-04 LAB — CBC WITH DIFFERENTIAL/PLATELET
BASOS ABS: 0.2 10*3/uL — AB (ref 0.0–0.1)
Basophils Relative: 1.3 % (ref 0.0–3.0)
Eosinophils Absolute: 0 10*3/uL (ref 0.0–0.7)
Eosinophils Relative: 0.4 % (ref 0.0–5.0)
HCT: 37.2 % (ref 36.0–46.0)
Hemoglobin: 11.7 g/dL — ABNORMAL LOW (ref 12.0–15.0)
LYMPHS PCT: 11.4 % — AB (ref 12.0–46.0)
Lymphs Abs: 1.4 10*3/uL (ref 0.7–4.0)
MCHC: 31.4 g/dL (ref 30.0–36.0)
MCV: 87.2 fl (ref 78.0–100.0)
MONOS PCT: 3.7 % (ref 3.0–12.0)
Monocytes Absolute: 0.4 10*3/uL (ref 0.1–1.0)
NEUTROS ABS: 9.9 10*3/uL — AB (ref 1.4–7.7)
NEUTROS PCT: 83.2 % — AB (ref 43.0–77.0)
PLATELETS: 438 10*3/uL — AB (ref 150.0–400.0)
RBC: 4.26 Mil/uL (ref 3.87–5.11)
RDW: 16.2 % — ABNORMAL HIGH (ref 11.5–15.5)
WBC: 11.9 10*3/uL — ABNORMAL HIGH (ref 4.0–10.5)

## 2017-02-04 LAB — FERRITIN: FERRITIN: 39.7 ng/mL (ref 10.0–291.0)

## 2017-02-16 DIAGNOSIS — M0579 Rheumatoid arthritis with rheumatoid factor of multiple sites without organ or systems involvement: Secondary | ICD-10-CM | POA: Diagnosis not present

## 2017-02-16 DIAGNOSIS — Z79899 Other long term (current) drug therapy: Secondary | ICD-10-CM | POA: Diagnosis not present

## 2017-02-24 ENCOUNTER — Telehealth: Payer: Self-pay | Admitting: Family Medicine

## 2017-02-24 DIAGNOSIS — D62 Acute posthemorrhagic anemia: Secondary | ICD-10-CM

## 2017-02-24 NOTE — Telephone Encounter (Signed)
-----   Message from Ellamae Sia sent at 02/23/2017  4:03 PM EDT ----- Regarding: Lab orders for Thursday, 5.17.18 Lab orders, no f/u appt

## 2017-03-05 ENCOUNTER — Other Ambulatory Visit (INDEPENDENT_AMBULATORY_CARE_PROVIDER_SITE_OTHER): Payer: Medicare Other

## 2017-03-05 DIAGNOSIS — D62 Acute posthemorrhagic anemia: Secondary | ICD-10-CM

## 2017-03-05 LAB — CBC WITH DIFFERENTIAL/PLATELET
BASOS PCT: 1 % (ref 0.0–3.0)
Basophils Absolute: 0.1 10*3/uL (ref 0.0–0.1)
Eosinophils Absolute: 0.2 10*3/uL (ref 0.0–0.7)
Eosinophils Relative: 1.7 % (ref 0.0–5.0)
HEMATOCRIT: 39.4 % (ref 36.0–46.0)
Hemoglobin: 12.7 g/dL (ref 12.0–15.0)
LYMPHS PCT: 22.1 % (ref 12.0–46.0)
Lymphs Abs: 2.2 10*3/uL (ref 0.7–4.0)
MCHC: 32.3 g/dL (ref 30.0–36.0)
MCV: 85.6 fl (ref 78.0–100.0)
MONOS PCT: 4.3 % (ref 3.0–12.0)
Monocytes Absolute: 0.4 10*3/uL (ref 0.1–1.0)
NEUTROS ABS: 7.2 10*3/uL (ref 1.4–7.7)
Neutrophils Relative %: 70.9 % (ref 43.0–77.0)
Platelets: 391 10*3/uL (ref 150.0–400.0)
RBC: 4.6 Mil/uL (ref 3.87–5.11)
RDW: 17.4 % — AB (ref 11.5–15.5)
WBC: 10.1 10*3/uL (ref 4.0–10.5)

## 2017-03-06 ENCOUNTER — Encounter: Payer: Self-pay | Admitting: *Deleted

## 2017-03-10 ENCOUNTER — Ambulatory Visit (INDEPENDENT_AMBULATORY_CARE_PROVIDER_SITE_OTHER): Payer: Medicare Other | Admitting: Gastroenterology

## 2017-03-10 ENCOUNTER — Encounter: Payer: Self-pay | Admitting: Gastroenterology

## 2017-03-10 VITALS — BP 148/80 | HR 74 | Ht 68.0 in | Wt 195.0 lb

## 2017-03-10 DIAGNOSIS — K5731 Diverticulosis of large intestine without perforation or abscess with bleeding: Secondary | ICD-10-CM

## 2017-03-10 DIAGNOSIS — D62 Acute posthemorrhagic anemia: Secondary | ICD-10-CM

## 2017-03-10 DIAGNOSIS — K581 Irritable bowel syndrome with constipation: Secondary | ICD-10-CM

## 2017-03-10 MED ORDER — DICYCLOMINE HCL 20 MG PO TABS: 20.0000 mg | ORAL_TABLET | Freq: Three times a day (TID) | ORAL | 5 refills | Status: DC

## 2017-03-10 NOTE — Patient Instructions (Signed)
Increase your Bentyl to 20 mg before meals and at bedtime four times a day. A new prescription has been sent to your pharmacy.   Thank you for choosing me and [facility].  Pricilla Riffle. Dagoberto Ligas., MD., Marval Regal

## 2017-03-10 NOTE — Progress Notes (Signed)
History of Present Illness: This is a 76 year old female returning for follow-up after hospitalization for an ABL anemia and an acute LGI bleed, felt likely to be diverticular. She is accompanied by her husband. No recurrent GI bleeding. She received a blood transfusion during the hospitalization and at discharge Hb=10.1. On May Hb=11.7. She complains of mild fatigue and intermittent generalized abdominal discomfort. She does not relate her symptoms to meals or particular time of day. She does feel like dicyclomine has been helpful in controlling her symptoms. She generally has a bowel movement each day when using MiraLAX regularly. She has not noted any recurrent gastrointestinal bleeding.  Colonoscopy 03/2016  - Non-thrombosed external hemorrhoids/tags found on digital rectal exam. - One 5 mm polyp in the sigmoid colon, removed with a cold biopsy forceps. Resected and retrieved. - Mild diverticulosis in the sigmoid colon. There was no evidence of diverticular bleeding. - Internal hemorrhoids.   Allergies  Allergen Reactions  . Bee Venom Anaphylaxis  . Cetirizine Hcl     REACTION: does not work  . Hctz [Hydrochlorothiazide] Other (See Comments)    Felt weak all over  Felt weak all over    Outpatient Medications Prior to Visit  Medication Sig Dispense Refill  . acetaminophen (TYLENOL) 500 MG tablet Take 500 mg by mouth every 6 (six) hours as needed.    Marland Kitchen albuterol (PROVENTIL HFA;VENTOLIN HFA) 108 (90 Base) MCG/ACT inhaler Inhale 2 puffs into the lungs every 4 (four) hours as needed for wheezing or shortness of breath. Please give spacer if possible 1 Inhaler 1  . amLODipine (NORVASC) 5 MG tablet Take 1 tablet (5 mg total) by mouth daily. 90 tablet 3  . Calcium & Magnesium Carbonates (MYLANTA PO) Take 1 capsule by mouth as directed.      . dicyclomine (BENTYL) 10 MG capsule Take 1 capsule (10 mg total) by mouth 3 (three) times daily before meals. 30 capsule 0  . docusate sodium (COLACE)  100 MG capsule Take 1 capsule (100 mg total) by mouth 2 (two) times daily. 60 capsule 0  . EPINEPHrine (EPIPEN IJ) Inject as directed as needed. Reported on 12/05/2015    . esomeprazole (NEXIUM) 20 MG capsule Take 20 mg by mouth daily at 12 noon.    . feeding supplement (BOOST / RESOURCE BREEZE) LIQD Take 1 Container by mouth 3 (three) times daily between meals. 90 Container 0  . fluticasone (FLONASE) 50 MCG/ACT nasal spray PLACE 2 SPRAYS INTO THE NOSE DAILY. 48 g 3  . folic acid (FOLVITE) 1 MG tablet Take 1 mg by mouth daily.    . hydrocortisone (ANUSOL-HC) 25 MG suppository Place 1 suppository (25 mg total) rectally 2 (two) times daily. For 7 days 14 suppository 0  . loratadine (CLARITIN) 10 MG tablet Take 10 mg by mouth daily.    . methotrexate (RHEUMATREX) 2.5 MG tablet Take 15 mg by mouth once a week. Caution:Chemotherapy. Protect from light. ( Monday of each week )    . polyethylene glycol (MIRALAX / GLYCOLAX) packet Take 17 g by mouth daily.    . predniSONE (DELTASONE) 5 MG tablet Take 5 mg by mouth daily with breakfast.    . traMADol (ULTRAM) 50 MG tablet Take 1 tablet (50 mg total) by mouth every 8 (eight) hours as needed. 15 tablet 0   Facility-Administered Medications Prior to Visit  Medication Dose Route Frequency Provider Last Rate Last Dose  . lidocaine (PF) (XYLOCAINE) 1 % injection 0.3 mL  0.3 mL Other  Once Magnus Sinning, MD       Past Medical History:  Diagnosis Date  . Allergic rhinitis   . Anxiety   . Cataract 2015   bilateral  . Diabetes mellitus with complication ([place])   . Diverticulosis   . Esophageal reflux   . Essential hypertension 01/07/2016  . Fatty liver 2018  . Fibula fracture 02/2005  . Former smoker   . Gastritis 2003  . Heart murmur   . Hemorrhoids   . History of nephrolithiasis   . Hyperglycemia   . Hyperlipemia   . Osteoarthritis   . Personal history of colonic polyps 1998   hyperplastic  . RA (rheumatoid arthritis) ([place])   . Shingles 2012  .  Uterine fibroid    Past Surgical History:  Procedure Laterality Date  . CHOLECYSTECTOMY    . COLONOSCOPY  11/08   internal hemorrhoids  . EP procedure  1994   SVT; normal echo  . ESOPHAGOGASTRODUODENOSCOPY  11/03   reactive gastropathy  . LIPOMA EXCISION  11/2002  . SVT s/p surgery--? ablation    . UPPER GASTROINTESTINAL ENDOSCOPY    . vaginal polyp excised  12/2000   benign   Social History   Social History  . Marital status: Married    Spouse name: N/A  . Number of children: 3  . Years of education: N/A   Occupational History  . retired   .  Retired   Social History Main Topics  . Smoking status: Former Smoker    Quit date: 10/21/2003  . Smokeless tobacco: Never Used  . Alcohol use No  . Drug use: No  . Sexual activity: Yes   Other Topics Concern  . None   Social History Narrative   Married      Retired      Occasional caffeine      No regular exercise   Family History  Problem Relation Age of Onset  . Colon cancer Father 80  . Diabetes Mother   . Heart disease Mother   . Kidney disease Mother        ESRD  . Stroke Mother   . Hypertension Brother   . Breast cancer Sister   . Diabetes Other        nephews  . Colon cancer Sister   . Esophageal cancer Neg Hx   . Stomach cancer Neg Hx   . Rectal cancer Neg Hx       Physical Exam: General: Well developed, well nourished, no acute distress Head: Normocephalic and atraumatic Eyes:  sclerae anicteric, EOMI Ears: Normal auditory acuity Mouth: No deformity or lesions Lungs: Clear throughout to auscultation Heart: Regular rate and rhythm; no murmurs, rubs or bruits Abdomen: Soft, non tender and non distended. No masses, hepatosplenomegaly or hernias noted. Normal Bowel sounds Musculoskeletal: Symmetrical with no gross deformities  Pulses:  Normal pulses noted Extremities: No clubbing, cyanosis, edema or deformities noted Neurological: Alert oriented x 4, grossly nonfocal Psychological:  Alert and  cooperative. Normal mood and affect  Assessment and Recommendations:  1. Diverticular bleed, resolved. No further treatment needed at this time. Long-term high fiber diet with adequate daily water intake.  2. ABL anemia, improved. Follow up with PCP.   3. IBS. Increase dicyclomine to 20 mg po ac and hs. REV in 6 months.

## 2017-03-11 ENCOUNTER — Telehealth: Payer: Self-pay | Admitting: Family Medicine

## 2017-03-11 NOTE — Telephone Encounter (Signed)
[name] wants intermitte fmla to help mom with dr appointments and sick

## 2017-03-12 NOTE — Telephone Encounter (Signed)
[name]  Aware pt has to fill out dpr before I can fill out fmla paperwork.  Mailed dpr to Norah Legrand

## 2017-03-26 ENCOUNTER — Encounter: Payer: Self-pay | Admitting: Physician Assistant

## 2017-03-26 ENCOUNTER — Ambulatory Visit (INDEPENDENT_AMBULATORY_CARE_PROVIDER_SITE_OTHER): Payer: Medicare Other | Admitting: Physician Assistant

## 2017-03-26 VITALS — BP 132/64 | HR 88 | Ht 66.25 in | Wt 181.5 lb

## 2017-03-26 DIAGNOSIS — R531 Weakness: Secondary | ICD-10-CM | POA: Diagnosis not present

## 2017-03-26 DIAGNOSIS — R1084 Generalized abdominal pain: Secondary | ICD-10-CM

## 2017-03-26 DIAGNOSIS — K219 Gastro-esophageal reflux disease without esophagitis: Secondary | ICD-10-CM

## 2017-03-26 MED ORDER — ESOMEPRAZOLE MAGNESIUM 40 MG PO CPDR: 40.0000 mg | DELAYED_RELEASE_CAPSULE | Freq: Two times a day (BID) | ORAL | 3 refills | Status: DC

## 2017-03-26 NOTE — Progress Notes (Signed)
Chief Complaint: Abdominal pain and weakness  Previous GI history: CT abdomen and pelvis with contrast 01/20/17: No explanation for the blood per rectum is seen other than a few diverticula within the rectosigmoid colon, no mass, suspect fatty titration of liver, moderate abdominal aortic atherosclerosis, calcified uterine fibroids Colonoscopy 03/2016  - Non-thrombosed external hemorrhoids/tags found on digital rectal exam. - One 5 mm polyp in the sigmoid colon, removed with a cold biopsy forceps. Resected and retrieved. - Mild diverticulosis in the sigmoid colon. There was no evidence of diverticular bleeding. - Internal hemorrhoids EGD 03/2016: small HH, otherwise normal Colonoscopy 03/2016: mixed hemorrhoids, mild diverticulosis, one small hyperplastic polyp  CMP, CBC, amylase, lipase, TSH - normal ESR minimally elevated at 33 CT abd/pelvis 10/2015: Fatty liver, atherosclerosis sclerosis of the aorta and branch vessels, uterine fibroid, postcholecystectomy CT angio chest 10/2015: Patchy lower lobe atelectasis focal coronary artery calcifications left ventricular hypertrophy Abd [place] 10/2015: Fatty liver disease, postcholecystectomy   HPI:  [name] is a 76 year old African-American female with a past medical history of anxiety, diabetes, osteoarthritis and multiple others listed below, typically follows with Dr. Fuller Plan and returns to clinic today with a complaint of abdominal pain and weakness.     Per chart review patient was just seen in clinic on 03/10/17 by Dr. Fuller Plan and that time was following up after hospitalization in early April for acute blood loss anemia and acute lower GI bleed felt to be diverticular. At that time the patient had no recurrent bleeding and her hemoglobin was improving. She had mild fatigue and intermittent generalized abdominal discomfort. At that time is recommended patient increase her dicyclomine to 20 mg before meals and at night.    Per further chart review patient  was seen previous to this on 07/01/16 for abdominal pain which seems chronic for her. Apparently she had an extensive gastrointestinal evaluation which is summarized above. It was thought her abdominal pain was likely abdominal wall pain and she has instructed take Tylenol 3 times a day.    Today, the patient tells me that she has excruciating generalized abdominal pain which seems to radiate up and down the mid line of her stomach. She tells me that sometimes this pain is sharp and other times dull and sometimes it makes her have to stop and hold her stomach". Patient tells that nothing seems to make this better or worse. She is using her ascending omeprazole 20 mg twice a day. She is also using her dicyclomine 20 mg before meals and at bedtime and does not feel like this is helping at all. Associated symptoms included general feeling of weakness, patient tells me that sometimes she feels dizzy upon standing. She has noticed no further GI bleeding.   Patient's medical history is positive for an appointment set with her PCP in 5 days.   Patient denies fever, chills, blood in her stool, melena, weight loss, anorexia, nausea, vomiting, heartburn, reflux or symptoms that awaken her at night.  Past Medical History:  Diagnosis Date  . Allergic rhinitis   . Anxiety   . Cataract 2015   bilateral  . Diabetes mellitus with complication ([place])   . Diverticulosis   . Esophageal reflux   . Essential hypertension 01/07/2016  . Fatty liver 2018  . Fibula fracture 02/2005  . Former smoker   . Gastritis 2003  . Heart murmur   . Hemorrhoids   . History of nephrolithiasis   . Hyperglycemia   . Hyperlipemia   . Osteoarthritis   .  Personal history of colonic polyps 1998   hyperplastic  . RA (rheumatoid arthritis) ([place])   . Shingles 2012  . Uterine fibroid     Past Surgical History:  Procedure Laterality Date  . CHOLECYSTECTOMY    . COLONOSCOPY  11/08   internal hemorrhoids  . EP procedure  1994   SVT;  normal echo  . ESOPHAGOGASTRODUODENOSCOPY  11/03   reactive gastropathy  . LIPOMA EXCISION  11/2002  . SVT s/p surgery--? ablation    . UPPER GASTROINTESTINAL ENDOSCOPY    . vaginal polyp excised  12/2000   benign    Current Outpatient Prescriptions  Medication Sig Dispense Refill  . acetaminophen (TYLENOL) 500 MG tablet Take 500 mg by mouth every 6 (six) hours as needed.    Marland Kitchen albuterol (PROVENTIL HFA;VENTOLIN HFA) 108 (90 Base) MCG/ACT inhaler Inhale 2 puffs into the lungs every 4 (four) hours as needed for wheezing or shortness of breath. Please give spacer if possible 1 Inhaler 1  . amLODipine (NORVASC) 5 MG tablet Take 1 tablet (5 mg total) by mouth daily. 90 tablet 3  . Calcium & Magnesium Carbonates (MYLANTA PO) Take 1 capsule by mouth as directed.      . dicyclomine (BENTYL) 20 MG tablet Take 1 tablet (20 mg total) by mouth 4 (four) times daily -  before meals and at bedtime. 120 tablet 5  . docusate sodium (COLACE) 100 MG capsule Take 1 capsule (100 mg total) by mouth 2 (two) times daily. 60 capsule 0  . EPINEPHrine (EPIPEN IJ) Inject as directed as needed. Reported on 12/05/2015    . feeding supplement (BOOST / RESOURCE BREEZE) LIQD Take 1 Container by mouth 3 (three) times daily between meals. 90 Container 0  . fluticasone (FLONASE) 50 MCG/ACT nasal spray PLACE 2 SPRAYS INTO THE NOSE DAILY. 48 g 3  . folic acid (FOLVITE) 1 MG tablet Take 1 mg by mouth daily.    . hydrocortisone (ANUSOL-HC) 25 MG suppository Place 1 suppository (25 mg total) rectally 2 (two) times daily. For 7 days 14 suppository 0  . loratadine (CLARITIN) 10 MG tablet Take 10 mg by mouth daily.    . methotrexate (RHEUMATREX) 2.5 MG tablet Take 15 mg by mouth once a week. Caution:Chemotherapy. Protect from light. ( Monday of each week )    . polyethylene glycol (MIRALAX / GLYCOLAX) packet Take 17 g by mouth daily.    . predniSONE (DELTASONE) 5 MG tablet Take 5 mg by mouth daily with breakfast.    . traMADol (ULTRAM)  50 MG tablet Take 1 tablet (50 mg total) by mouth every 8 (eight) hours as needed. 15 tablet 0  . esomeprazole (NEXIUM) 40 MG capsule Take 1 capsule (40 mg total) by mouth 2 (two) times daily before a meal. 60 capsule 3   Current Facility-Administered Medications  Medication Dose Route Frequency Provider Last Rate Last Dose  . lidocaine (PF) (XYLOCAINE) 1 % injection 0.3 mL  0.3 mL Other Once Magnus Sinning, MD        Allergies as of 03/26/2017 - Review Complete 03/10/2017  Allergen Reaction Noted  . Bee venom Anaphylaxis 12/09/2012  . Hctz [hydrochlorothiazide] Other (See Comments) 02/06/2016  . Zyrtec allergy [cetirizine hcl]  12/10/2009    Family History  Problem Relation Age of Onset  . Colon cancer Father 35  . Diabetes Mother   . Heart disease Mother   . Kidney disease Mother        ESRD  . Stroke Mother   .  Hypertension Brother   . Breast cancer Sister   . Diabetes Other        nephews  . Colon cancer Sister   . Esophageal cancer Neg Hx   . Stomach cancer Neg Hx   . Rectal cancer Neg Hx     Social History   Social History  . Marital status: Married    Spouse name: N/A  . Number of children: 3  . Years of education: N/A   Occupational History  . retired   .  Retired   Social History Main Topics  . Smoking status: Former Smoker    Quit date: 10/21/2003  . Smokeless tobacco: Never Used  . Alcohol use No  . Drug use: No  . Sexual activity: Yes   Other Topics Concern  . Not on file   Social History Narrative   Married      Retired      Occasional caffeine      No regular exercise    Review of Systems:    Constitutional: No weight loss, fever or chills Cardiovascular: No chest pain   Respiratory: No SOB  Gastrointestinal: See HPI and otherwise negative   Physical Exam:  Vital signs: BP 132/64 (BP Location: Left Arm, Patient Position: Sitting, Cuff Size: Normal)   Pulse 88   Ht 5' 6.25" (1.683 m) Comment: height measured without shoes  Wt  181 lb 8 oz (82.3 kg)   BMI 29.07 kg/m   Constitutional:   Pleasant African American female appears to be in NAD, Well developed, Well nourished, alert and cooperative Respiratory: Respirations even and unlabored. Lungs clear to auscultation bilaterally.   No wheezes, crackles, or rhonchi.  Cardiovascular: Normal S1, S2. No MRG. Regular rate and rhythm. No peripheral edema, cyanosis or pallor.  Gastrointestinal:  Soft, nondistended, Generalized ttp, worse in midline of stomach (epigastrum and suprapubic area). No rebound or guarding. Normal bowel sounds. No appreciable masses or hepatomegaly. Psychiatric: Demonstrates good judgement and reason without abnormal affect or behaviors.  RELEVANT LABS AND IMAGING: CBC    Component Value Date/Time   WBC 10.1 03/05/2017 1017   RBC 4.60 03/05/2017 1017   HGB 12.7 03/05/2017 1017   HCT 39.4 03/05/2017 1017   PLT 391.0 03/05/2017 1017   MCV 85.6 03/05/2017 1017   MCH 28.5 01/26/2017 0459   MCHC 32.3 03/05/2017 1017   RDW 17.4 (H) 03/05/2017 1017   LYMPHSABS 2.2 03/05/2017 1017   MONOABS 0.4 03/05/2017 1017   EOSABS 0.2 03/05/2017 1017   BASOSABS 0.1 03/05/2017 1017    CMP     Component Value Date/Time   NA 138 02/03/2017 1713   K 3.9 02/03/2017 1713   CL 102 02/03/2017 1713   CO2 26 02/03/2017 1713   GLUCOSE 129 (H) 02/03/2017 1713   BUN 8 02/03/2017 1713   CREATININE 0.74 02/03/2017 1713   CALCIUM 9.5 02/03/2017 1713   PROT 6.5 01/21/2017 0510   ALBUMIN 3.2 (L) 01/21/2017 0510   AST 25 01/21/2017 0510   ALT 16 01/21/2017 0510   ALKPHOS 60 01/21/2017 0510   BILITOT 0.4 01/21/2017 0510   GFRNONAA >60 01/26/2017 0459   GFRAA >60 01/26/2017 0459    Assessment: 1. Abdominal pain: Down the midline of the patient's stomach, question gastritis plus minus possible uterine fibroids giving her suprapubic pain?, Abdominal pain has been evaluated extensively in the past with anoscopy colonoscopy and patient was diagnosed with abdominal  wall pain which could certainly be causing some of her symptoms  today 2. GERD: History of reflux was patient is on his omeprazole 20 mg twice a day, now with epigastric abdominal pain; consider gastritis versus other 3. Weakness: Uncertain etiology, likely it is no longer her anemia, question whether she has an element of heart failure versus other, she is due to see her PCP next week  Plan: 1. After extensive review of patient's chart it appears his abdominal pain has been worked up previously and patient was diagnosed with abdominal wall pain and told to use Tylenol 3 times a day. We did discuss this again at time of today's visit. Would recommend that she restart this regimen, especially since she has been given no further tramadol from her PCP. 2. We'll also increase patient's ascending omeprazole to 40 mg twice a day as she is tender in her epigastrium and the could be an element of gastritis. 3. It appears patient's most recent hemoglobin returned to normal around 12, would not suspect that this is giving her weakness at this time. She does have point with her PCP on June 12, would recommend that she follow with them regarding this generalized weakness. 4. Patient to follow in clinic as needed in the future.     Ellouise Newer, PA-C [facility] 03/26/2017, 10:26 AM  Cc: Tower, Wynelle Fanny, MD

## 2017-03-26 NOTE — Patient Instructions (Signed)
We have sent the following medications to your pharmacy for you to pick up at your convenience:  Esomeprazole 40 mg twice a day.  Use Tylenol as scheduled three times a day.

## 2017-03-29 NOTE — Progress Notes (Signed)
Reviewed and agree with management plan.  Joan Herschberger T. Jaydian Santana, MD FACG 

## 2017-03-31 ENCOUNTER — Ambulatory Visit (INDEPENDENT_AMBULATORY_CARE_PROVIDER_SITE_OTHER): Payer: Medicare Other | Admitting: Family Medicine

## 2017-03-31 ENCOUNTER — Encounter: Payer: Self-pay | Admitting: Family Medicine

## 2017-03-31 VITALS — BP 134/70 | HR 85 | Temp 97.5°F | Ht 66.25 in | Wt 191.8 lb

## 2017-03-31 DIAGNOSIS — R109 Unspecified abdominal pain: Secondary | ICD-10-CM | POA: Insufficient documentation

## 2017-03-31 DIAGNOSIS — I700 Atherosclerosis of aorta: Secondary | ICD-10-CM

## 2017-03-31 DIAGNOSIS — R1084 Generalized abdominal pain: Secondary | ICD-10-CM | POA: Insufficient documentation

## 2017-03-31 DIAGNOSIS — E118 Type 2 diabetes mellitus with unspecified complications: Secondary | ICD-10-CM

## 2017-03-31 DIAGNOSIS — I10 Essential (primary) hypertension: Secondary | ICD-10-CM | POA: Diagnosis not present

## 2017-03-31 LAB — COMPREHENSIVE METABOLIC PANEL
ALT: 18 U/L (ref 0–35)
AST: 21 U/L (ref 0–37)
Albumin: 4.1 g/dL (ref 3.5–5.2)
Alkaline Phosphatase: 63 U/L (ref 39–117)
BUN: 7 mg/dL (ref 6–23)
CALCIUM: 9.1 mg/dL (ref 8.4–10.5)
CHLORIDE: 103 meq/L (ref 96–112)
CO2: 31 meq/L (ref 19–32)
CREATININE: 0.71 mg/dL (ref 0.40–1.20)
GFR: 103.23 mL/min (ref >60.00)
GLUCOSE: 133 mg/dL — AB (ref 70–99)
Potassium: 3.3 mEq/L — ABNORMAL LOW (ref 3.5–5.1)
SODIUM: 140 meq/L (ref 135–145)
Total Bilirubin: 0.3 mg/dL (ref 0.2–1.2)
Total Protein: 7.5 g/dL (ref 6.0–8.3)

## 2017-03-31 LAB — CBC WITH DIFFERENTIAL/PLATELET
BASOS PCT: 0.6 % (ref 0.0–3.0)
Basophils Absolute: 0.1 10*3/uL (ref 0.0–0.1)
EOS ABS: 0.1 10*3/uL (ref 0.0–0.7)
Eosinophils Relative: 1.2 % (ref 0.0–5.0)
HEMATOCRIT: 38.1 % (ref 36.0–46.0)
Hemoglobin: 12.6 g/dL (ref 12.0–15.0)
Lymphocytes Relative: 18.1 % (ref 12.0–46.0)
Lymphs Abs: 1.5 10*3/uL (ref 0.7–4.0)
MCHC: 33.1 g/dL (ref 30.0–36.0)
MCV: 83.3 fl (ref 78.0–100.0)
Monocytes Absolute: 0.4 10*3/uL (ref 0.1–1.0)
Monocytes Relative: 5.4 % (ref 3.0–12.0)
NEUTROS ABS: 6.1 10*3/uL (ref 1.4–7.7)
Neutrophils Relative %: 74.7 % (ref 43.0–77.0)
PLATELETS: 358 10*3/uL (ref 150.0–400.0)
RBC: 4.57 Mil/uL (ref 3.87–5.11)
RDW: 17.2 % — AB (ref 11.5–15.5)
WBC: 8.2 10*3/uL (ref 4.0–10.5)

## 2017-03-31 LAB — LIPASE: Lipase: 31 U/L (ref 11.0–59.0)

## 2017-03-31 LAB — AMYLASE: AMYLASE: 37 U/L (ref 27–131)

## 2017-03-31 NOTE — Progress Notes (Signed)
Subjective:    Patient ID: [name], female    DOB: 1941/03/18, 76 y.o.   MRN: [id number]  HPI  Last seen for f/u of anemia/ susp diverticular bleed   She thinks since Monday she looks more pale since Monday  Wants cbc checked today    Wt Readings from Last 3 Encounters:  03/31/17 191 lb 12 oz (87 kg)  03/26/17 181 lb 8 oz (82.3 kg)  03/10/17 195 lb (88.5 kg)   bmi 30.7 Was 50 a year ago  Appetite is not good and gets full quickly   F/u with Dr Fuller Plan in GI in may No recurrent bleeding and cbc was improving   She then f/u with GI again 6/7 for "excrutiating" general abdominal pain which seems to radiate up and down mid line of her stomach  Using omeprazole 20 mg bid and dicyclomine 20 mg before meals and bedtime  The dicyclomine helps a little but not much  No further rectal bleeding  They determined that her pain may be abdominal wall pain since she has had extensive GI w/u  Tylenol recommended  Also for gastritis they inc her omeprazole to 40 mg bid    Lab Results  Component Value Date   WBC 10.1 03/05/2017   HGB 12.7 03/05/2017   HCT 39.4 03/05/2017   MCV 85.6 03/05/2017   PLT 391.0 03/05/2017   Lab Results  Component Value Date   FERRITIN 39.7 02/03/2017     Chemistry      Component Value Date/Time   NA 138 02/03/2017 1713   K 3.9 02/03/2017 1713   CL 102 02/03/2017 1713   CO2 26 02/03/2017 1713   BUN 8 02/03/2017 1713   CREATININE 0.74 02/03/2017 1713      Component Value Date/Time   CALCIUM 9.5 02/03/2017 1713   ALKPHOS 60 01/21/2017 0510   AST 25 01/21/2017 0510   ALT 16 01/21/2017 0510   BILITOT 0.4 01/21/2017 0510       Hx of DM Lab Results  Component Value Date   HGBA1C 6.9 (H) 01/21/2017  was taking prednisone - finishing it  Last cxr  Exam Information   Status Exam Begun  Exam Ended   Final [99] 06/09/2016 12:51 PM 06/09/2016 12:52 PM  PACS Images   Show images for DG Chest 2 View  Study Result   CLINICAL DATA:   Coughing and wheezing since last month  EXAM: CHEST  2 VIEW  COMPARISON:  Portable chest x-ray of November 02, 2015 and chest CT scan of the same date.  FINDINGS: The lungs are hyperinflated. There is no focal infiltrate. There is no pleural effusion. The heart and pulmonary vascularity are normal. The mediastinum is normal in width. There is calcification in the wall of the aortic arch. There is mild multilevel degenerative disc disease of the thoracic spine.  IMPRESSION: COPD. There is no pneumonia nor other acute cardiopulmonary abnormality.  Aortic atherosclerosis.   Also has hx of uterine fibroid   bp is stable BP Readings from Last 3 Encounters:  03/31/17 134/70  03/26/17 132/64  03/10/17 (!) 148/80   For RA-sees rheum Takes mtx Prednisone Tramadol   Still has abdominal pain  It hurts so bad that she cries - hurts in the epigastric area and then radiates down to her pelvis  A bra hurts her to push on her upper abdomen  Has spells of pain every day in the am -then it wears off Then she feels weak  and panicky     Has deg disc dz but not a lot of back pain   She is interested in going to a tertiary care center   Patient Active Problem List   Diagnosis Date Noted  . Abdominal pain 03/31/2017  . Aortic atherosclerosis ([place]) 02/03/2017  . Diverticulitis of colon with bleeding 01/26/2017  . RA (rheumatoid arthritis) ([place])   . Diabetes mellitus with complication ([place])   . Adjustment disorder with mixed anxiety and depressed mood 06/24/2016  . Essential hypertension 01/07/2016  . Coronary atherosclerosis 11/07/2015  . GERD 07/09/2007   Past Medical History:  Diagnosis Date  . Allergic rhinitis   . Anxiety   . Cataract 2015   bilateral  . Diabetes mellitus with complication ([place])   . Diverticulosis   . Esophageal reflux   . Essential hypertension 01/07/2016  . Fatty liver 2018  . Fibula fracture 02/2005  . Former smoker   . Gastritis 2003  . Heart  murmur   . Hemorrhoids   . History of nephrolithiasis   . Hyperglycemia   . Hyperlipemia   . Osteoarthritis   . Personal history of colonic polyps 1998   hyperplastic  . RA (rheumatoid arthritis) ([place])   . Shingles 2012  . Uterine fibroid    Past Surgical History:  Procedure Laterality Date  . CHOLECYSTECTOMY    . COLONOSCOPY  11/08   internal hemorrhoids  . EP procedure  1994   SVT; normal echo  . ESOPHAGOGASTRODUODENOSCOPY  11/03   reactive gastropathy  . LIPOMA EXCISION  11/2002  . SVT s/p surgery--? ablation    . UPPER GASTROINTESTINAL ENDOSCOPY    . vaginal polyp excised  12/2000   benign   Social History  Substance Use Topics  . Smoking status: Former Smoker    Quit date: 10/21/2003  . Smokeless tobacco: Never Used  . Alcohol use No   Family History  Problem Relation Age of Onset  . Colon cancer Father 54  . Diabetes Mother   . Heart disease Mother   . Kidney disease Mother        ESRD  . Stroke Mother   . Hypertension Brother   . Breast cancer Sister   . Diabetes Other        nephews  . Colon cancer Sister   . Esophageal cancer Neg Hx   . Stomach cancer Neg Hx   . Rectal cancer Neg Hx    Allergies  Allergen Reactions  . Bee Venom Anaphylaxis  . Hctz [Hydrochlorothiazide] Other (See Comments)    Felt weak all over  Felt weak all over   . Zyrtec Allergy [Cetirizine Hcl]     Doesn't help   Current Outpatient Prescriptions on File Prior to Visit  Medication Sig Dispense Refill  . acetaminophen (TYLENOL) 500 MG tablet Take 500 mg by mouth every 6 (six) hours as needed.    Marland Kitchen albuterol (PROVENTIL HFA;VENTOLIN HFA) 108 (90 Base) MCG/ACT inhaler Inhale 2 puffs into the lungs every 4 (four) hours as needed for wheezing or shortness of breath. Please give spacer if possible 1 Inhaler 1  . amLODipine (NORVASC) 5 MG tablet Take 1 tablet (5 mg total) by mouth daily. 90 tablet 3  . Calcium & Magnesium Carbonates (MYLANTA PO) Take 1 capsule by mouth as directed.       . dicyclomine (BENTYL) 20 MG tablet Take 1 tablet (20 mg total) by mouth 4 (four) times daily -  before meals and at bedtime. [place]  tablet 5  . docusate sodium (COLACE) 100 MG capsule Take 1 capsule (100 mg total) by mouth 2 (two) times daily. 60 capsule 0  . EPINEPHrine (EPIPEN IJ) Inject as directed as needed. Reported on 12/05/2015    . esomeprazole (NEXIUM) 40 MG capsule Take 1 capsule (40 mg total) by mouth 2 (two) times daily before a meal. 60 capsule 3  . feeding supplement (BOOST / RESOURCE BREEZE) LIQD Take 1 Container by mouth 3 (three) times daily between meals. 90 Container 0  . fluticasone (FLONASE) 50 MCG/ACT nasal spray PLACE 2 SPRAYS INTO THE NOSE DAILY. 48 g 3  . folic acid (FOLVITE) 1 MG tablet Take 1 mg by mouth daily.    . hydrocortisone (ANUSOL-HC) 25 MG suppository Place 1 suppository (25 mg total) rectally 2 (two) times daily. For 7 days 14 suppository 0  . loratadine (CLARITIN) 10 MG tablet Take 10 mg by mouth daily.    . methotrexate (RHEUMATREX) 2.5 MG tablet Take 15 mg by mouth once a week. Caution:Chemotherapy. Protect from light. ( Monday of each week )    . polyethylene glycol (MIRALAX / GLYCOLAX) packet Take 17 g by mouth daily.    . predniSONE (DELTASONE) 5 MG tablet Take 5 mg by mouth daily with breakfast.    . traMADol (ULTRAM) 50 MG tablet Take 1 tablet (50 mg total) by mouth every 8 (eight) hours as needed. 15 tablet 0   Current Facility-Administered Medications on File Prior to Visit  Medication Dose Route Frequency Provider Last Rate Last Dose  . lidocaine (PF) (XYLOCAINE) 1 % injection 0.3 mL  0.3 mL Other Once Magnus Sinning, MD         Review of Systems Review of Systems  Constitutional: Negative for fever, appetite change, and unexpected weight change. pos for fatigue Eyes: Negative for pain and visual disturbance.  Respiratory: Negative for cough and shortness of breath.   Cardiovascular: Negative for cp or palpitations    Gastrointestinal:  Negative for nausea, diarrhea and constipation. pos for abdominal pain intermittent/ neg for blood in stool  Genitourinary: Negative for urgency and frequency.  Skin: Negative for pallor or rash   Neurological: Negative for weakness, light-headedness, numbness and headaches.  Hematological: Negative for adenopathy. Does not bruise/bleed easily.  Psychiatric/Behavioral: Negative for dysphoric mood. The patient is not nervous/anxious.         Objective:   Physical Exam  Constitutional: She appears well-developed and well-nourished. No distress.  overwt and well app  HENT:  Head: Normocephalic and atraumatic.  Mouth/Throat: Oropharynx is clear and moist.  Eyes: Conjunctivae and EOM are normal. Pupils are equal, round, and reactive to light. No scleral icterus.  Neck: Normal range of motion. Neck supple.  Cardiovascular: Normal rate, regular rhythm and normal heart sounds.   Pulmonary/Chest: Effort normal and breath sounds normal. No respiratory distress. She has no wheezes. She has no rales.  Abdominal: Soft. Bowel sounds are normal. She exhibits no distension, no pulsatile liver, no abdominal bruit, no ascites, no pulsatile midline mass and no mass. There is no hepatosplenomegaly. There is tenderness in the epigastric area. There is no rigidity, no rebound, no guarding, no CVA tenderness, no tenderness at McBurney's point and negative Murphy's sign.  Lymphadenopathy:    She has no cervical adenopathy.  Neurological: She is alert. No cranial nerve deficit. She exhibits normal muscle tone. Coordination normal.  Skin: Skin is warm and dry. No erythema. No pallor.  Psychiatric: She has a normal mood and affect.  Assessment & Plan:   Problem List Items Addressed This Visit      Cardiovascular and Mediastinum   Aortic atherosclerosis ([place])    Reviewed today  No aneurysm seen      Essential hypertension    bp in fair control at this time  BP Readings from Last 1 Encounters:   03/31/17 134/70   No changes needed Disc lifstyle change with low sodium diet and exercise          Endocrine   Diabetes mellitus with complication (HCC)    Finishing prednisone Lab Results  Component Value Date   HGBA1C 6.9 (H) 01/21/2017    Expect imp off this  Lab due in July        Other   Abdominal pain - Primary    Hx of epigastric pain that radiates to low abdomen/ colicky and s/p w/u with GI incl lab/egd/colonoscopy and CT Pt also has RA and will disc with her rheumatologist  ? If prednisone may exacerbate- will be done with it soon Lab today incl amylase and lipase  Ref to GI at [facility] for further eval  Continue ppi and dicyclomine prn and continue to follow closely      Relevant Orders   Comprehensive metabolic panel (Completed)   CBC with Differential/Platelet (Completed)   Lipase (Completed)   Amylase (Completed)   Ambulatory referral to Gastroenterology

## 2017-03-31 NOTE — Patient Instructions (Addendum)
If your abdominal pain improves off prednisone let me know and also talk to your rheumatoid doctor about your abdominal pain   Lab today for blood count and chemistry and pancreas enzymes   Eat light- if you notice certain foods make this worse- avoid that   We need to check labs in July for A1C   We will refer you to GI at [facility]

## 2017-04-01 ENCOUNTER — Encounter: Payer: Self-pay | Admitting: *Deleted

## 2017-04-01 NOTE — Telephone Encounter (Signed)
Form given to Robin 

## 2017-04-01 NOTE — Telephone Encounter (Signed)
Done I estimated time the best I could based on her symptoms-may not need quite as much as I specified but not sure yet  In IN box We can change it as needed Thanks

## 2017-04-01 NOTE — Telephone Encounter (Signed)
fmla paperwork in dr tower's in box °For review and signature °

## 2017-04-02 NOTE — Assessment & Plan Note (Signed)
Reviewed today  No aneurysm seen

## 2017-04-02 NOTE — Assessment & Plan Note (Signed)
Hx of epigastric pain that radiates to low abdomen/ colicky and s/p w/u with GI incl lab/egd/colonoscopy and CT Pt also has RA and will disc with her rheumatologist  ? If prednisone may exacerbate- will be done with it soon Lab today incl amylase and lipase  Ref to GI at [facility] for further eval  Continue ppi and dicyclomine prn and continue to follow closely

## 2017-04-02 NOTE — Assessment & Plan Note (Signed)
Finishing prednisone Lab Results  Component Value Date   HGBA1C 6.9 (H) 01/21/2017    Expect imp off this  Lab due in July

## 2017-04-02 NOTE — Telephone Encounter (Signed)
Pt is aware paperwork complete She wants me to fax then mail to Darlyn's address

## 2017-04-02 NOTE — Assessment & Plan Note (Signed)
bp in fair control at this time  BP Readings from Last 1 Encounters:  03/31/17 134/70   No changes needed Disc lifstyle change with low sodium diet and exercise

## 2017-04-06 NOTE — Telephone Encounter (Signed)
Copy for file °Copy for scan °Copy for pt °

## 2017-05-06 DIAGNOSIS — Z86010 Personal history of colonic polyps: Secondary | ICD-10-CM | POA: Diagnosis not present

## 2017-05-06 DIAGNOSIS — K648 Other hemorrhoids: Secondary | ICD-10-CM | POA: Diagnosis not present

## 2017-05-06 DIAGNOSIS — Z8719 Personal history of other diseases of the digestive system: Secondary | ICD-10-CM | POA: Diagnosis not present

## 2017-05-06 DIAGNOSIS — I7090 Unspecified atherosclerosis: Secondary | ICD-10-CM | POA: Diagnosis not present

## 2017-05-06 DIAGNOSIS — Z87891 Personal history of nicotine dependence: Secondary | ICD-10-CM | POA: Diagnosis not present

## 2017-05-06 DIAGNOSIS — K760 Fatty (change of) liver, not elsewhere classified: Secondary | ICD-10-CM | POA: Diagnosis not present

## 2017-05-06 DIAGNOSIS — R1033 Periumbilical pain: Secondary | ICD-10-CM | POA: Diagnosis not present

## 2017-05-08 DIAGNOSIS — M0579 Rheumatoid arthritis with rheumatoid factor of multiple sites without organ or systems involvement: Secondary | ICD-10-CM | POA: Diagnosis not present

## 2017-05-08 DIAGNOSIS — Z79899 Other long term (current) drug therapy: Secondary | ICD-10-CM | POA: Diagnosis not present

## 2017-05-20 ENCOUNTER — Encounter: Payer: Self-pay | Admitting: Family Medicine

## 2017-05-20 ENCOUNTER — Ambulatory Visit (INDEPENDENT_AMBULATORY_CARE_PROVIDER_SITE_OTHER): Payer: Medicare Other | Admitting: Family Medicine

## 2017-05-20 VITALS — BP 166/93 | HR 101 | Temp 98.8°F | Ht 66.25 in | Wt 187.2 lb

## 2017-05-20 DIAGNOSIS — M1990 Unspecified osteoarthritis, unspecified site: Secondary | ICD-10-CM | POA: Diagnosis not present

## 2017-05-20 DIAGNOSIS — M0579 Rheumatoid arthritis with rheumatoid factor of multiple sites without organ or systems involvement: Secondary | ICD-10-CM

## 2017-05-20 MED ORDER — DEXAMETHASONE SODIUM PHOSPHATE 10 MG/ML IJ SOLN
10.0000 mg | Freq: Once | INTRAMUSCULAR | Status: AC
  Administered 2017-05-20: 10 mg via INTRAMUSCULAR

## 2017-05-20 NOTE — Progress Notes (Signed)
Dr. Frederico Hamman T. Marquelle Balow, MD, [facility] [place], [address] Phone: [phone] Fax: [phone]  05/20/2017  Patient: [name], MRN: [id number], DOB: 05-17-1941, 76 y.o.  Primary Physician:  Tower, Wynelle Fanny, MD   Chief Complaint  Patient presents with  . Arthritis    Hands, Neck, Knees   Subjective:   [name] is a 75 y.o. very pleasant female patient who presents with the following:  Patient with known RA. On MTX and oral prednisone.  Sees [facility].   Has been hurting - legs and neck and hands. Both of knuckles in hands, wrists, elbows.   Essentially, the patient is hurting everywhere.  She has significant pain in her neck additionally.  She also is having some significant pain and swelling in her right knee.  All the joints mentioned above have some synovitis and swelling as well as pain.  Past Medical History, Surgical History, Social History, Family History, Problem List, Medications, and Allergies have been reviewed and updated if relevant.  Patient Active Problem List   Diagnosis Date Noted  . Abdominal pain 03/31/2017  . Aortic atherosclerosis ([place]) 02/03/2017  . Diverticulitis of colon with bleeding 01/26/2017  . RA (rheumatoid arthritis) ([place])   . Diabetes mellitus with complication ([place])   . Adjustment disorder with mixed anxiety and depressed mood 06/24/2016  . Essential hypertension 01/07/2016  . Coronary atherosclerosis 11/07/2015  . GERD 07/09/2007    Past Medical History:  Diagnosis Date  . Allergic rhinitis   . Anxiety   . Cataract 2015   bilateral  . Diabetes mellitus with complication ([place])   . Diverticulosis   . Esophageal reflux   . Essential hypertension 01/07/2016  . Fatty liver 2018  . Fibula fracture 02/2005  . Former smoker   . Gastritis 2003  . Heart murmur   . Hemorrhoids   . History of nephrolithiasis   . Hyperglycemia   . Hyperlipemia   . Osteoarthritis     . Personal history of colonic polyps 1998   hyperplastic  . RA (rheumatoid arthritis) ([place])   . Shingles 2012  . Uterine fibroid     Past Surgical History:  Procedure Laterality Date  . CHOLECYSTECTOMY    . COLONOSCOPY  11/08   internal hemorrhoids  . EP procedure  1994   SVT; normal echo  . ESOPHAGOGASTRODUODENOSCOPY  11/03   reactive gastropathy  . LIPOMA EXCISION  11/2002  . SVT s/p surgery--? ablation    . UPPER GASTROINTESTINAL ENDOSCOPY    . vaginal polyp excised  12/2000   benign    Social History   Social History  . Marital status: Married    Spouse name: N/A  . Number of children: 3  . Years of education: N/A   Occupational History  . retired   .  Retired   Social History Main Topics  . Smoking status: Former Smoker    Quit date: 10/21/2003  . Smokeless tobacco: Never Used  . Alcohol use No  . Drug use: No  . Sexual activity: Yes   Other Topics Concern  . Not on file   Social History Narrative   Married      Retired      Occasional caffeine      No regular exercise    Family History  Problem Relation Age of Onset  . Colon cancer Father 50  . Diabetes Mother   . Heart disease Mother   .  Kidney disease Mother        ESRD  . Stroke Mother   . Hypertension Brother   . Breast cancer Sister   . Diabetes Other        nephews  . Colon cancer Sister   . Esophageal cancer Neg Hx   . Stomach cancer Neg Hx   . Rectal cancer Neg Hx     Allergies  Allergen Reactions  . Bee Venom Anaphylaxis  . Other Anaphylaxis  . Hctz [Hydrochlorothiazide] Other (See Comments)    Felt weak all over  Felt weak all over   . Zyrtec Allergy [Cetirizine Hcl]     Doesn't help    Medication list reviewed and updated in full in [place].  GEN: No fevers, chills. Nontoxic. Primarily MSK c/o today. MSK: Detailed in the HPI GI: tolerating PO intake without difficulty Neuro: No numbness, parasthesias, or tingling associated. Otherwise the pertinent  positives of the ROS are noted above.   Objective:   BP (!) 166/93   Pulse (!) 101   Temp 98.8 F (37.1 C) (Oral)   Ht 5' 6.25" (1.683 m)   Wt 187 lb 4 oz (84.9 kg)   BMI 30.00 kg/m    GEN: WDWN, NAD, Non-toxic, Alert & Oriented x 3 HEENT: Atraumatic, Normocephalic.  Ears and Nose: No external deformity. EXTR: No clubbing/cyanosis/edema NEURO: Normal gait.  PSYCH: Normally interactive. Conversant. Not depressed or anxious appearing.  Calm demeanor.    Right hands at all DIP, PIP, MCP joints as well as the true wrist have some swelling and synovitis.  They're all tender to palpation.  On the left elbow there is some swelling at the olecranon which is also tender to palpation.  There is modest restriction of motion at the elbow with flexion and extension.  Cervical range of motion has an approximate 60% loss of range of motion in all directions with pain with terminal motion.  The right knee has a mild effusion.  Full extension.  Flexion to 105.  ACL, PCL, MCL, and LCL are all stable.  There is also pain with logrolling manipulation of both hips.  Radiology: No results found.  Assessment and Plan:   Rheumatoid arthritis involving multiple sites with positive rheumatoid factor (HCC)  Arthritis - Plan: dexamethasone (DECADRON) injection 10 mg  RA flare.  Diffuse.  I will give her some IM Decadron currently.  She is currently on methotrexate. Biologics might not be unreasonable to consider.   Follow-up: No Follow-up on file.  Meds ordered this encounter  Medications  . cyclobenzaprine (FLEXERIL) 5 MG tablet  . pantoprazole (PROTONIX) 40 MG tablet    Sig: Take 40 mg by mouth daily.    Refill:  2  . dexamethasone (DECADRON) injection 10 mg   Medications Discontinued During This Encounter  Medication Reason  . esomeprazole (NEXIUM) 40 MG capsule Change in therapy    Signed,  Param Capri T. Gar Glance, MD   Allergies as of 05/20/2017      Reactions   Bee Venom  Anaphylaxis   Other Anaphylaxis   Hctz [hydrochlorothiazide] Other (See Comments)   Felt weak all over  Felt weak all over    Zyrtec Allergy [cetirizine Hcl]    Doesn't help      Medication List       Accurate as of 05/20/17  6:39 PM. Always use your most recent med list.          acetaminophen 500 MG tablet Commonly known as:  TYLENOL Take 500 mg by mouth every 6 (six) hours as needed.   albuterol 108 (90 Base) MCG/ACT inhaler Commonly known as:  PROVENTIL HFA;VENTOLIN HFA Inhale 2 puffs into the lungs every 4 (four) hours as needed for wheezing or shortness of breath. Please give spacer if possible   amLODipine 5 MG tablet Commonly known as:  NORVASC Take 1 tablet (5 mg total) by mouth daily.   cyclobenzaprine 5 MG tablet Commonly known as:  FLEXERIL   dicyclomine 20 MG tablet Commonly known as:  BENTYL Take 1 tablet (20 mg total) by mouth 4 (four) times daily -  before meals and at bedtime.   docusate sodium 100 MG capsule Commonly known as:  COLACE Take 1 capsule (100 mg total) by mouth 2 (two) times daily.   EPIPEN IJ Inject as directed as needed. Reported on 12/05/2015   feeding supplement Liqd Take 1 Container by mouth 3 (three) times daily between meals.   fluticasone 50 MCG/ACT nasal spray Commonly known as:  FLONASE PLACE 2 SPRAYS INTO THE NOSE DAILY.   folic acid 1 MG tablet Commonly known as:  FOLVITE Take 1 mg by mouth daily.   hydrocortisone 25 MG suppository Commonly known as:  ANUSOL-HC Place 1 suppository (25 mg total) rectally 2 (two) times daily. For 7 days   loratadine 10 MG tablet Commonly known as:  CLARITIN Take 10 mg by mouth daily.   methotrexate 2.5 MG tablet Commonly known as:  RHEUMATREX Take 15 mg by mouth once a week. Caution:Chemotherapy. Protect from light. ( Monday of each week )   MYLANTA PO Take 1 capsule by mouth as directed.   pantoprazole 40 MG tablet Commonly known as:  PROTONIX Take 40 mg by mouth daily.     polyethylene glycol packet Commonly known as:  MIRALAX / GLYCOLAX Take 17 g by mouth daily.   predniSONE 5 MG tablet Commonly known as:  DELTASONE Take 5 mg by mouth daily with breakfast.   traMADol 50 MG tablet Commonly known as:  ULTRAM Take 1 tablet (50 mg total) by mouth every 8 (eight) hours as needed.

## 2017-06-23 DIAGNOSIS — R1033 Periumbilical pain: Secondary | ICD-10-CM | POA: Diagnosis not present

## 2017-06-23 DIAGNOSIS — I10 Essential (primary) hypertension: Secondary | ICD-10-CM | POA: Diagnosis not present

## 2017-06-23 DIAGNOSIS — K449 Diaphragmatic hernia without obstruction or gangrene: Secondary | ICD-10-CM | POA: Diagnosis not present

## 2017-08-10 ENCOUNTER — Ambulatory Visit (INDEPENDENT_AMBULATORY_CARE_PROVIDER_SITE_OTHER): Payer: Medicare Other | Admitting: Family Medicine

## 2017-08-10 ENCOUNTER — Encounter: Payer: Self-pay | Admitting: Family Medicine

## 2017-08-10 VITALS — BP 136/78 | HR 76 | Temp 98.3°F | Ht 66.25 in | Wt 194.5 lb

## 2017-08-10 DIAGNOSIS — M069 Rheumatoid arthritis, unspecified: Secondary | ICD-10-CM | POA: Diagnosis not present

## 2017-08-10 MED ORDER — METHYLPREDNISOLONE ACETATE 40 MG/ML IJ SUSP
80.0000 mg | Freq: Once | INTRAMUSCULAR | Status: AC
  Administered 2017-08-10: 80 mg via INTRA_ARTICULAR

## 2017-08-10 NOTE — Progress Notes (Signed)
Dr. Frederico Hamman T. Amparo Donalson, MD, [facility] [place], [address] Phone: [phone] Fax: [phone]  08/10/2017  Patient: [name], MRN: [id number], DOB: 1941/10/05, 76 y.o.  Primary Physician:  Tower, Wynelle Fanny, MD   Chief Complaint  Patient presents with  . Knee Pain    Bilateral-Radiates down shin   Subjective:   TUESDAY TERLECKI is a 76 y.o. very pleasant female patient who presents with the following:  RA on MTX, put back on it this year. Her rheumatologist recently increase her dosing of her methotrexate. She now has some global last symptoms, and last time I saw her a few months ago gave her 10 milligrams of Decadron IM, and this helped her symptoms overall. Now she is having some worsening pain in her knees which is limiting her function. She has had joint injections in the past, but has been greater than a year.  B knee injections  Past Medical History, Surgical History, Social History, Family History, Problem List, Medications, and Allergies have been reviewed and updated if relevant.  Patient Active Problem List   Diagnosis Date Noted  . Abdominal pain 03/31/2017  . Aortic atherosclerosis ([place]) 02/03/2017  . Diverticulitis of colon with bleeding 01/26/2017  . RA (rheumatoid arthritis) ([place])   . Diabetes mellitus with complication ([place])   . Adjustment disorder with mixed anxiety and depressed mood 06/24/2016  . Essential hypertension 01/07/2016  . Coronary atherosclerosis 11/07/2015  . GERD 07/09/2007    Past Medical History:  Diagnosis Date  . Allergic rhinitis   . Anxiety   . Cataract 2015   bilateral  . Diabetes mellitus with complication ([place])   . Diverticulosis   . Esophageal reflux   . Essential hypertension 01/07/2016  . Fatty liver 2018  . Fibula fracture 02/2005  . Former smoker   . Gastritis 2003  . Heart murmur   . Hemorrhoids   . History of nephrolithiasis   . Hyperglycemia     . Hyperlipemia   . Osteoarthritis   . Personal history of colonic polyps 1998   hyperplastic  . RA (rheumatoid arthritis) ([place])   . Shingles 2012  . Uterine fibroid     Past Surgical History:  Procedure Laterality Date  . CHOLECYSTECTOMY    . COLONOSCOPY  11/08   internal hemorrhoids  . EP procedure  1994   SVT; normal echo  . ESOPHAGOGASTRODUODENOSCOPY  11/03   reactive gastropathy  . LIPOMA EXCISION  11/2002  . SVT s/p surgery--? ablation    . UPPER GASTROINTESTINAL ENDOSCOPY    . vaginal polyp excised  12/2000   benign    Social History   Social History  . Marital status: Married    Spouse name: N/A  . Number of children: 3  . Years of education: N/A   Occupational History  . retired   .  Retired   Social History Main Topics  . Smoking status: Former Smoker    Quit date: 10/21/2003  . Smokeless tobacco: Never Used  . Alcohol use No  . Drug use: No  . Sexual activity: Yes   Other Topics Concern  . Not on file   Social History Narrative   Married      Retired      Occasional caffeine      No regular exercise    Family History  Problem Relation Age of Onset  . Colon cancer Father 52  . Diabetes Mother   .  Heart disease Mother   . Kidney disease Mother        ESRD  . Stroke Mother   . Hypertension Brother   . Breast cancer Sister   . Diabetes Other        nephews  . Colon cancer Sister   . Esophageal cancer Neg Hx   . Stomach cancer Neg Hx   . Rectal cancer Neg Hx     Allergies  Allergen Reactions  . Bee Venom Anaphylaxis  . Other Anaphylaxis  . Hctz [Hydrochlorothiazide] Other (See Comments)    Felt weak all over  Felt weak all over   . Zyrtec Allergy [Cetirizine Hcl]     Doesn't help    Medication list reviewed and updated in full in [place].  GEN: No fevers, chills. Nontoxic. Primarily MSK c/o today. MSK: Detailed in the HPI GI: tolerating PO intake without difficulty Neuro: No numbness, parasthesias, or tingling  associated. Otherwise the pertinent positives of the ROS are noted above.   Objective:   BP 136/78   Pulse 76   Temp 98.3 F (36.8 C) (Oral)   Ht 5' 6.25" (1.683 m)   Wt 194 lb 8 oz (88.2 kg)   BMI 31.16 kg/m    GEN: WDWN, NAD, Non-toxic, Alert & Oriented x 3 HEENT: Atraumatic, Normocephalic.  Ears and Nose: No external deformity. EXTR: No clubbing/cyanosis/edema NEURO: Normal gait.  PSYCH: Normally interactive. Conversant. Not depressed or anxious appearing.  Calm demeanor.    Bilateral knees: Mild effusion. Pain on the medial and lateral joint lines bilaterally. Mild pain with manipulation of the patella. Stable varus and valgus stress. Stable anterior cruciate ligament and PCL stable. McMurray's and flexion pinch causes pain. 0-110..  Radiology: No results found.  Assessment and Plan:   Rheumatoid arthritis involving both knees, unspecified rheumatoid factor presence ([place]) - Plan: methylPREDNISolone acetate (DEPO-MEDROL) injection 80 mg, methylPREDNISolone acetate (DEPO-MEDROL) injection 80 mg  Keep up with regular rheumatological appointment.  Knee Injection, RIGHT Patient verbally consented to procedure. Risks (including potential rare risk of infection), benefits, and alternatives explained. Sterilely prepped with Chloraprep. Ethyl cholride used for anesthesia. 8 cc Lidocaine 1% mixed with 2 mL Depo-Medrol 40 mg injected using the anteromedial approach without difficulty. No complications with procedure and tolerated well. Patient had decreased pain post-injection.   Knee Injection, LEFT Patient verbally consented to procedure. Risks (including potential rare risk of infection), benefits, and alternatives explained. Sterilely prepped with Chloraprep. Ethyl cholride used for anesthesia. 8 cc Lidocaine 1% mixed with 2 mL Depo-Medrol 40 mg injected using the anteromedial approach without difficulty. No complications with procedure and tolerated well. Patient had decreased  pain post-injection.     Follow-up: No Follow-up on file.  Future Appointments Date Time Provider [place]  10/23/2017 10:30 AM Eustace Pen, LPN [facility]  6/0/4540 11:30 AM Tower, Wynelle Fanny, MD [facility]    Meds ordered this encounter  Medications  . methylPREDNISolone acetate (DEPO-MEDROL) injection 80 mg  . methylPREDNISolone acetate (DEPO-MEDROL) injection 80 mg   Signed,  Jamee Pacholski T. Nykolas Bacallao, MD   Allergies as of 08/10/2017      Reactions   Bee Venom Anaphylaxis   Other Anaphylaxis   Hctz [hydrochlorothiazide] Other (See Comments)   Felt weak all over  Felt weak all over    Zyrtec Allergy [cetirizine Hcl]    Doesn't help      Medication List       Accurate as of 08/10/17  1:47 PM. Always  use your most recent med list.          acetaminophen 500 MG tablet Commonly known as:  TYLENOL Take 500 mg by mouth every 6 (six) hours as needed.   albuterol 108 (90 Base) MCG/ACT inhaler Commonly known as:  PROVENTIL HFA;VENTOLIN HFA Inhale 2 puffs into the lungs every 4 (four) hours as needed for wheezing or shortness of breath. Please give spacer if possible   amLODipine 5 MG tablet Commonly known as:  NORVASC Take 1 tablet (5 mg total) by mouth daily.   cyclobenzaprine 5 MG tablet Commonly known as:  FLEXERIL   dicyclomine 20 MG tablet Commonly known as:  BENTYL Take 1 tablet (20 mg total) by mouth 4 (four) times daily -  before meals and at bedtime.   docusate sodium 100 MG capsule Commonly known as:  COLACE Take 1 capsule (100 mg total) by mouth 2 (two) times daily.   EPIPEN IJ Inject as directed as needed. Reported on 12/05/2015   feeding supplement Liqd Take 1 Container by mouth 3 (three) times daily between meals.   fluticasone 50 MCG/ACT nasal spray Commonly known as:  FLONASE PLACE 2 SPRAYS INTO THE NOSE DAILY.   folic acid 1 MG tablet Commonly known as:  FOLVITE Take 1 mg by mouth daily.   hydrocortisone 25 MG  suppository Commonly known as:  ANUSOL-HC Place 1 suppository (25 mg total) rectally 2 (two) times daily. For 7 days   loratadine 10 MG tablet Commonly known as:  CLARITIN Take 10 mg by mouth daily.   methotrexate 2.5 MG tablet Commonly known as:  RHEUMATREX Take 15 mg by mouth once a week. Caution:Chemotherapy. Protect from light. ( Monday of each week )   MYLANTA PO Take 1 capsule by mouth as directed.   pantoprazole 40 MG tablet Commonly known as:  PROTONIX Take 40 mg by mouth daily.   polyethylene glycol packet Commonly known as:  MIRALAX / GLYCOLAX Take 17 g by mouth daily.   predniSONE 5 MG tablet Commonly known as:  DELTASONE Take 5 mg by mouth daily with breakfast.   traMADol 50 MG tablet Commonly known as:  ULTRAM Take 1 tablet (50 mg total) by mouth every 8 (eight) hours as needed.

## 2017-08-18 DIAGNOSIS — M25562 Pain in left knee: Secondary | ICD-10-CM | POA: Diagnosis not present

## 2017-08-18 DIAGNOSIS — M25561 Pain in right knee: Secondary | ICD-10-CM | POA: Diagnosis not present

## 2017-08-18 DIAGNOSIS — Z79899 Other long term (current) drug therapy: Secondary | ICD-10-CM | POA: Diagnosis not present

## 2017-08-18 DIAGNOSIS — G8929 Other chronic pain: Secondary | ICD-10-CM | POA: Diagnosis not present

## 2017-08-18 DIAGNOSIS — M0579 Rheumatoid arthritis with rheumatoid factor of multiple sites without organ or systems involvement: Secondary | ICD-10-CM | POA: Diagnosis not present

## 2017-10-16 ENCOUNTER — Telehealth: Payer: Self-pay | Admitting: Family Medicine

## 2017-10-16 DIAGNOSIS — I10 Essential (primary) hypertension: Secondary | ICD-10-CM

## 2017-10-16 DIAGNOSIS — E118 Type 2 diabetes mellitus with unspecified complications: Secondary | ICD-10-CM

## 2017-10-16 NOTE — Telephone Encounter (Signed)
-----   Message from Eustace Pen, LPN sent at [phone]/74/[id number]  4:42 PM EST ----- Regarding: Labs 1/4 Lab orders needed. Thank you.  Insurance:  [facility]  [facility], microalbumin and A1C needed.

## 2017-10-21 ENCOUNTER — Ambulatory Visit: Payer: Self-pay | Admitting: *Deleted

## 2017-10-21 NOTE — Telephone Encounter (Signed)
Cherly Hensen RN also noted; Pt having abd pain with some diarrhea and nausea and vomiting since yesterday. She has an appointment for Friday and home care advice given to patient. (Routing comment)

## 2017-10-21 NOTE — Telephone Encounter (Signed)
  Reason for Disposition . [1] MODERATE pain (e.g., interferes with normal activities) AND [2] pain comes and goes (cramps) AND [3] present > 24 hours  (Exception: pain with Vomiting or Diarrhea - see that Guideline)  Answer Assessment - Initial Assessment Questions 1. LOCATION: "Where does it hurt?"      Lower abd 2. RADIATION: "Does the pain shoot anywhere else?" (e.g., chest, back)     Lower part of left side 3. ONSET: "When did the pain begin?" (e.g., minutes, hours or days ago)      yesterday 4. SUDDEN: "Gradual or sudden onset?"     gradual 5. PATTERN "Does the pain come and go, or is it constant?"    - If constant: "Is it getting better, staying the same, or worsening?"      (Note: Constant means the pain never goes away completely; most serious pain is constant and it progresses)     - If intermittent: "How long does it last?" "Do you have pain now?"     (Note: Intermittent means the pain goes away completely between bouts)     contant 6. SEVERITY: "How bad is the pain?"  (e.g., Scale 1-10; mild, moderate, or severe)   - MILD (1-3): doesn't interfere with normal activities, abdomen soft and not tender to touch    - MODERATE (4-7): interferes with normal activities or awakens from sleep, tender to touch    - SEVERE (8-10): excruciating pain, doubled over, unable to do any normal activities      4 7. RECURRENT SYMPTOM: "Have you ever had this type of abdominal pain before?" If so, ask: "When was the last time?" and "What happened that time?"      Yes has had before with diverticulitis. In April. Change medications for stomach. 8. CAUSE: "What do you think is causing the abdominal pain?"     virus 9. RELIEVING/AGGRAVATING FACTORS: "What makes it better or worse?" (e.g., movement, antacids, bowel movement)     Had bowel movement that only helped briefly 10. OTHER SYMPTOMS: "Has there been any vomiting, diarrhea, constipation, or urine problems?"       Diarrhea, vomiting 11.  PREGNANCY: "Is there any chance you are pregnant?" "When was your last menstrual period?"       n/a  Protocols used: ABDOMINAL PAIN - [facility]

## 2017-10-21 NOTE — Telephone Encounter (Signed)
Pt called because she has had abd pain, diarrhea and with nausea and vomiting since yesterday. She thinks she got this from her grandchildren.  Encourage to try the clear liquids, and not get dehydrated.  Home care advice given to patient with verbal understanding. Pt advised to go UC if she starts feeling worst. She has an appointment already on Friday.

## 2017-10-21 NOTE — Telephone Encounter (Signed)
Please keep [place] posted- hope she is starting to feel better  Keep working on fluids as tolerated  I will see her Friday  If symptoms suddenly worsen after hours -get to the ED

## 2017-10-21 NOTE — Telephone Encounter (Signed)
Mrs. Finder notified as instructed by telephone.

## 2017-10-23 ENCOUNTER — Ambulatory Visit (INDEPENDENT_AMBULATORY_CARE_PROVIDER_SITE_OTHER): Payer: Medicare Other

## 2017-10-23 VITALS — BP 132/68 | HR 76 | Temp 97.9°F | Ht 66.25 in | Wt 192.8 lb

## 2017-10-23 DIAGNOSIS — Z0000 Encounter for general adult medical examination without abnormal findings: Secondary | ICD-10-CM

## 2017-10-23 DIAGNOSIS — E118 Type 2 diabetes mellitus with unspecified complications: Secondary | ICD-10-CM

## 2017-10-23 DIAGNOSIS — I10 Essential (primary) hypertension: Secondary | ICD-10-CM | POA: Diagnosis not present

## 2017-10-23 LAB — COMPREHENSIVE METABOLIC PANEL WITH GFR
ALT: 10 U/L (ref 0–35)
AST: 18 U/L (ref 0–37)
Albumin: 4.2 g/dL (ref 3.5–5.2)
Alkaline Phosphatase: 75 U/L (ref 39–117)
BUN: 7 mg/dL (ref 6–23)
CO2: 27 meq/L (ref 19–32)
Calcium: 9 mg/dL (ref 8.4–10.5)
Chloride: 101 meq/L (ref 96–112)
Creatinine, Ser: 0.84 mg/dL (ref 0.40–1.20)
GFR: 84.9 mL/min
Glucose, Bld: 107 mg/dL — ABNORMAL HIGH (ref 70–99)
Potassium: 3.5 meq/L (ref 3.5–5.1)
Sodium: 137 meq/L (ref 135–145)
Total Bilirubin: 0.5 mg/dL (ref 0.2–1.2)
Total Protein: 7.6 g/dL (ref 6.0–8.3)

## 2017-10-23 LAB — CBC WITH DIFFERENTIAL/PLATELET
Basophils Absolute: 0 K/uL (ref 0.0–0.1)
Basophils Relative: 0.2 % (ref 0.0–3.0)
Eosinophils Absolute: 0 K/uL (ref 0.0–0.7)
Eosinophils Relative: 0.7 % (ref 0.0–5.0)
HCT: 40.8 % (ref 36.0–46.0)
Hemoglobin: 13.3 g/dL (ref 12.0–15.0)
Lymphocytes Relative: 22.7 % (ref 12.0–46.0)
Lymphs Abs: 1.3 K/uL (ref 0.7–4.0)
MCHC: 32.7 g/dL (ref 30.0–36.0)
MCV: 85.8 fl (ref 78.0–100.0)
Monocytes Absolute: 0.4 K/uL (ref 0.1–1.0)
Monocytes Relative: 7.5 % (ref 3.0–12.0)
Neutro Abs: 4 K/uL (ref 1.4–7.7)
Neutrophils Relative %: 68.9 % (ref 43.0–77.0)
Platelets: 261 K/uL (ref 150.0–400.0)
RBC: 4.76 Mil/uL (ref 3.87–5.11)
RDW: 17.7 % — ABNORMAL HIGH (ref 11.5–15.5)
WBC: 5.8 K/uL (ref 4.0–10.5)

## 2017-10-23 LAB — HEMOGLOBIN A1C: Hgb A1c MFr Bld: 6.7 % — ABNORMAL HIGH (ref 4.6–6.5)

## 2017-10-23 LAB — LIPID PANEL
CHOL/HDL RATIO: 3
Cholesterol: 117 mg/dL (ref 0–200)
HDL: 35 mg/dL — ABNORMAL LOW (ref >39.00)
LDL CALC: 50 mg/dL (ref 0–99)
NONHDL: 81.63
Triglycerides: 160 mg/dL — ABNORMAL HIGH (ref 0.0–149.0)
VLDL: 32 mg/dL (ref 0.0–40.0)

## 2017-10-23 LAB — TSH: TSH: 1.6 u[IU]/mL (ref 0.35–4.50)

## 2017-10-23 LAB — MICROALBUMIN / CREATININE URINE RATIO
Creatinine,U: 285.1 mg/dL
Microalb Creat Ratio: 0.9 mg/g (ref 0.0–30.0)
Microalb, Ur: 2.6 mg/dL — ABNORMAL HIGH (ref 0.0–1.9)

## 2017-10-23 NOTE — Progress Notes (Signed)
Pre visit review using our clinic review tool, if applicable. No additional management support is needed unless otherwise documented below in the visit note. 

## 2017-10-23 NOTE — Patient Instructions (Addendum)
[name] , Thank you for taking time to come for your Medicare Wellness Visit. I appreciate your ongoing commitment to your health goals. Please review the following plan we discussed and let me know if I can assist you in the future.   These are the goals we discussed: Goals    . Increase physical activity     Starting 10/23/2017, I will continue to walk for 10 minutes daily.        This is a list of the screening recommended for you and due dates:  Health Maintenance  Topic Date Due  . Complete foot exam   10/27/2017*  . Flu Shot  01/17/2018*  . Mammogram  03/19/2018*  . Eye exam for diabetics  10/19/2018*  . Tetanus Vaccine  10/23/2018*  . Hemoglobin A1C  04/22/2018  . Urine Protein Check  10/23/2018  . DEXA scan (bone density measurement)  Completed  . Pneumonia vaccines  Completed  *Topic was postponed. The date shown is not the original due date.   Preventive Care for Adults  A healthy lifestyle and preventive care can promote health and wellness. Preventive health guidelines for adults include the following key practices.  . A routine yearly physical is a good way to check with your health care provider about your health and preventive screening. It is a chance to share any concerns and updates on your health and to receive a thorough exam.  . Visit your dentist for a routine exam and preventive care every 6 months. Brush your teeth twice a day and floss once a day. Good oral hygiene prevents tooth decay and gum disease.  . The frequency of eye exams is based on your age, health, family medical history, use  of contact lenses, and other factors. Follow your health care provider's recommendations for frequency of eye exams.  . Eat a healthy diet. Foods like vegetables, fruits, whole grains, low-fat dairy products, and lean protein foods contain the nutrients you need without too many calories. Decrease your intake of foods high in solid fats, added sugars, and salt. Eat the  right amount of calories for you. Get information about a proper diet from your health care provider, if necessary.  . Regular physical exercise is one of the most important things you can do for your health. Most adults should get at least 150 minutes of moderate-intensity exercise (any activity that increases your heart rate and causes you to sweat) each week. In addition, most adults need muscle-strengthening exercises on 2 or more days a week.  Silver Sneakers may be a benefit available to you. To determine eligibility, you may visit the website: www.silversneakers.com or contact program at (819) 755-5196 Mon-Fri between 8AM-8PM.   . Maintain a healthy weight. The body mass index (BMI) is a screening tool to identify possible weight problems. It provides an estimate of body fat based on height and weight. Your health care provider can find your BMI and can help you achieve or maintain a healthy weight.   For adults 20 years and older: ? A BMI below 18.5 is considered underweight. ? A BMI of 18.5 to 24.9 is normal. ? A BMI of 25 to 29.9 is considered overweight. ? A BMI of 30 and above is considered obese.   . Maintain normal blood lipids and cholesterol levels by exercising and minimizing your intake of saturated fat. Eat a balanced diet with plenty of fruit and vegetables. Blood tests for lipids and cholesterol should begin at age 55 and be  repeated every 5 years. If your lipid or cholesterol levels are high, you are over 50, or you are at high risk for heart disease, you may need your cholesterol levels checked more frequently. Ongoing high lipid and cholesterol levels should be treated with medicines if diet and exercise are not working.  . If you smoke, find out from your health care provider how to quit. If you do not use tobacco, please do not start.  . If you choose to drink alcohol, please do not consume more than 2 drinks per day. One drink is considered to be 12 ounces (355 mL) of  beer, 5 ounces (148 mL) of wine, or 1.5 ounces (44 mL) of liquor.  . If you are 5-65 years old, ask your health care provider if you should take aspirin to prevent strokes.  . Use sunscreen. Apply sunscreen liberally and repeatedly throughout the day. You should seek shade when your shadow is shorter than you. Protect yourself by wearing long sleeves, pants, a wide-brimmed hat, and sunglasses year round, whenever you are outdoors.  . Once a month, do a whole body skin exam, using a mirror to look at the skin on your back. Tell your health care provider of new moles, moles that have irregular borders, moles that are larger than a pencil eraser, or moles that have changed in shape or color.

## 2017-10-23 NOTE — Progress Notes (Signed)
PCP notes:   Health maintenance:  Foot exam - PCP please address at next appt Flu vaccine - postponed/pt is ill today Mammogram - addressed/pt plans to schedule appt Eye exam - addressed/pt plans to schedule appt Tetanus vaccine - postponed/insurance Microalbumin - completed A1C - completed  Abnormal screenings:   Depression score: 2 Depression screen [facility] Iv 2/9 10/23/2017 03/31/2017 01/03/2016 04/24/2014  Decreased Interest 0 1 0 0  Down, Depressed, Hopeless 0 1 0 0  PHQ - 2 Score 0 2 0 0  Altered sleeping 0 2 - -  Tired, decreased energy 0 3 - -  Change in appetite 0 3 - -  Feeling bad or failure about yourself  2 0 - -  Trouble concentrating 0 0 - -  Moving slowly or fidgety/restless 0 0 - -  Suicidal thoughts 0 0 - -  PHQ-9 Score 2 10 - -  Difficult doing work/chores Not difficult at all Somewhat difficult - -   Hearing - failed  Hearing Screening   125Hz  250Hz  500Hz  1000Hz  2000Hz  3000Hz  4000Hz  6000Hz  8000Hz   Right ear:   0 0 40  0    Left ear:   40 40 40  0     Patient concerns:   Pt is having acute concern with sinus drainage and chronic concern with abdominal pain.  Nurse concerns:  None  Next PCP appt:   10/27/17 @ 1130  I reviewed health advisor's note, was available for consultation, and agree with documentation and plan. Loura Pardon MD

## 2017-10-23 NOTE — Progress Notes (Signed)
Subjective:   [name] is a 77 y.o. female who presents for Medicare Annual (Subsequent) preventive examination.  Review of Systems:  N/A Cardiac Risk Factors include: advanced age (>98men, >81 women);dyslipidemia;hypertension;obesity (BMI >30kg/m2);diabetes mellitus     Objective:     Vitals: BP 132/68 (BP Location: Right Arm, Patient Position: Sitting, Cuff Size: Normal)   Pulse 76   Temp 97.9 F (36.6 C) (Oral)   Ht 5' 6.25" (1.683 m) Comment: no shoes  Wt 192 lb 12 oz (87.4 kg)   SpO2 97%   BMI 30.88 kg/m   Body mass index is 30.88 kg/m.  Advanced Directives 10/23/2017 01/21/2017 01/21/2017 01/20/2017 01/03/2016  Does Patient Have a Medical Advance Directive? No No No No No  Would patient like information on creating a medical advance directive? Yes (MAU/Ambulatory/Procedural Areas - Information given) No - Patient declined - - Yes - Educational materials given    Tobacco Social History   Tobacco Use  Smoking Status Former Smoker  . Last attempt to quit: 10/21/2003  . Years since quitting: 14.0  Smokeless Tobacco Never Used     Counseling given: No   Clinical Intake:  Pre-visit preparation completed: Yes  Pain : No/denies pain Pain Score: 5      Nutritional Status: BMI > 30  Obese Nutritional Risks: None Diabetes: Yes CBG done?: No Did pt. bring in CBG monitor from home?: No  How often do you need to have someone help you when you read instructions, pamphlets, or other written materials from your doctor or pharmacy?: 1 - Never What is the last grade level you completed in school?: 12th grade  Interpreter Needed?: No  Comments: pt lives with spouse Information entered by :: LPinson, LPN  Past Medical History:  Diagnosis Date  . Allergic rhinitis   . Anxiety   . Cataract 2015   bilateral  . Diabetes mellitus with complication ([place])   . Diverticulosis   . Esophageal reflux   . Essential hypertension 01/07/2016  . Fatty liver 2018  . Fibula  fracture 02/2005  . Former smoker   . Gastritis 2003  . Heart murmur   . Hemorrhoids   . History of nephrolithiasis   . Hyperglycemia   . Hyperlipemia   . Osteoarthritis   . Personal history of colonic polyps 1998   hyperplastic  . RA (rheumatoid arthritis) ([place])   . Shingles 2012  . Uterine fibroid    Past Surgical History:  Procedure Laterality Date  . CHOLECYSTECTOMY    . COLONOSCOPY  11/08   internal hemorrhoids  . EP procedure  1994   SVT; normal echo  . ESOPHAGOGASTRODUODENOSCOPY  11/03   reactive gastropathy  . LIPOMA EXCISION  11/2002  . SVT s/p surgery--? ablation    . UPPER GASTROINTESTINAL ENDOSCOPY    . vaginal polyp excised  12/2000   benign   Family History  Problem Relation Age of Onset  . Colon cancer Father 8  . Diabetes Mother   . Heart disease Mother   . Kidney disease Mother        ESRD  . Stroke Mother   . Hypertension Brother   . Breast cancer Sister   . Diabetes Other        nephews  . Colon cancer Sister   . Esophageal cancer Neg Hx   . Stomach cancer Neg Hx   . Rectal cancer Neg Hx    Social History   Socioeconomic History  . Marital status: Married  Spouse name: None  . Number of children: 3  . Years of education: None  . Highest education level: None  Social Needs  . Financial resource strain: None  . Food insecurity - worry: None  . Food insecurity - inability: None  . Transportation needs - medical: None  . Transportation needs - non-medical: None  Occupational History  . Occupation: retired    Fish farm manager: RETIRED  Tobacco Use  . Smoking status: Former Smoker    Last attempt to quit: 10/21/2003    Years since quitting: 14.0  . Smokeless tobacco: Never Used  Substance and Sexual Activity  . Alcohol use: No    Alcohol/week: 0.0 oz  . Drug use: No  . Sexual activity: Yes  Other Topics Concern  . None  Social History Narrative   Married      Retired      Occasional caffeine      No regular exercise     Outpatient Encounter Medications as of 10/23/2017  Medication Sig  . acetaminophen (TYLENOL) 500 MG tablet Take 500 mg by mouth every 6 (six) hours as needed.  Marland Kitchen albuterol (PROVENTIL HFA;VENTOLIN HFA) 108 (90 Base) MCG/ACT inhaler Inhale 2 puffs into the lungs every 4 (four) hours as needed for wheezing or shortness of breath. Please give spacer if possible  . amLODipine (NORVASC) 5 MG tablet Take 1 tablet (5 mg total) by mouth daily.  . Calcium & Magnesium Carbonates (MYLANTA PO) Take 1 capsule by mouth as directed.    . cyclobenzaprine (FLEXERIL) 5 MG tablet   . dicyclomine (BENTYL) 20 MG tablet Take 1 tablet (20 mg total) by mouth 4 (four) times daily -  before meals and at bedtime.  . docusate sodium (COLACE) 100 MG capsule Take 1 capsule (100 mg total) by mouth 2 (two) times daily.  Marland Kitchen EPINEPHrine (EPIPEN IJ) Inject as directed as needed. Reported on 12/05/2015  . feeding supplement (BOOST / RESOURCE BREEZE) LIQD Take 1 Container by mouth 3 (three) times daily between meals.  . fluticasone (FLONASE) 50 MCG/ACT nasal spray PLACE 2 SPRAYS INTO THE NOSE DAILY.  . folic acid (FOLVITE) 1 MG tablet Take 1 mg by mouth daily.  . hydrocortisone (ANUSOL-HC) 25 MG suppository Place 1 suppository (25 mg total) rectally 2 (two) times daily. For 7 days  . loratadine (CLARITIN) 10 MG tablet Take 10 mg by mouth daily.  . methotrexate (RHEUMATREX) 2.5 MG tablet Take 15 mg by mouth once a week. Caution:Chemotherapy. Protect from light. ( Monday of each week )  . pantoprazole (PROTONIX) 40 MG tablet Take 40 mg by mouth daily.  . polyethylene glycol (MIRALAX / GLYCOLAX) packet Take 17 g by mouth daily.  . predniSONE (DELTASONE) 5 MG tablet Take 5 mg by mouth daily with breakfast.  . traMADol (ULTRAM) 50 MG tablet Take 1 tablet (50 mg total) by mouth every 8 (eight) hours as needed.   Facility-Administered Encounter Medications as of 10/23/2017  Medication  . lidocaine (PF) (XYLOCAINE) 1 % injection 0.3 mL     Activities of Daily Living In your present state of health, do you have any difficulty performing the following activities: 10/23/2017 01/21/2017  Hearing? N N  Vision? N N  Difficulty concentrating or making decisions? N N  Walking or climbing stairs? Y N  Comment pain in legs when climbing stairs -  Dressing or bathing? N N  Doing errands, shopping? Y N  Preparing Food and eating ? N -  Using the Toilet? N -  In the past six months, have you accidently leaked urine? N -  Do you have problems with loss of bowel control? N -  Managing your Medications? N -  Managing your Finances? N -  Housekeeping or managing your Housekeeping? N -  Some recent data might be hidden    Patient Care Team: Tower, Wynelle Fanny, MD as PCP - General Fay Records, MD as Consulting Physician (Cardiology) Ladene Artist, MD as Consulting Physician (Gastroenterology) Lorelee Cover., MD as Consulting Physician (Ophthalmology) Lorelee Cover., MD as Referring Physician (Ophthalmology)    Assessment:   This is a routine wellness examination for [name].   Hearing Screening   125Hz  250Hz  500Hz  1000Hz  2000Hz  3000Hz  4000Hz  6000Hz  8000Hz   Right ear:   0 0 40  0    Left ear:   40 40 40  0      Visual Acuity Screening   Right eye Left eye Both eyes  Without correction: 20/25-1 20/25-1 20/25-1  With correction:       Exercise Activities and Dietary recommendations Current Exercise Habits: Home exercise routine, Type of exercise: walking, Time (Minutes): 10, Frequency (Times/Week): 7, Weekly Exercise (Minutes/Week): 70, Intensity: Mild, Exercise limited by: None identified  Goals    . Increase physical activity     Starting 10/23/2017, I will continue to walk for 10 minutes daily.        Fall Risk Fall Risk  10/23/2017 03/31/2017 01/03/2016 04/24/2014  Falls in the past year? No No No No   Depression Screen PHQ 2/9 Scores 10/23/2017 03/31/2017 01/03/2016 04/24/2014  PHQ - 2 Score 0 2 0 0  PHQ- 9 Score 2 10  - -     Cognitive Function MMSE - Mini Mental State Exam 10/23/2017 01/03/2016  Orientation to time 5 5  Orientation to Place 5 5  Registration 3 3  Attention/ Calculation 0 5  Recall 3 3  Language- name 2 objects 0 0  Language- repeat 1 1  Language- follow 3 step command 3 3  Language- read & follow direction 0 1  Write a sentence 0 0  Copy design 0 0  Total score 20 26     PLEASE NOTE: A Mini-Cog screen was completed. Maximum score is 20. A value of 0 denotes this part of Folstein MMSE was not completed or the patient failed this part of the Mini-Cog screening.   Mini-Cog Screening Orientation to Time - Max 5 pts Orientation to Place - Max 5 pts Registration - Max 3 pts Recall - Max 3 pts Language Repeat - Max 1 pts Language Follow 3 Step Command - Max 3 pts     Immunization History  Administered Date(s) Administered  . Influenza,inj,Quad PF,6+ Mos 08/10/2015, 08/28/2016  . Pneumococcal Conjugate-13 01/03/2016  . Pneumococcal Polysaccharide-23 04/24/2014  . Td 09/04/2006    Screening Tests Health Maintenance  Topic Date Due  . FOOT EXAM  10/27/2017 (Originally 05/04/1951)  . INFLUENZA VACCINE  01/17/2018 (Originally 05/20/2017)  . MAMMOGRAM  03/19/2018 (Originally 02/26/2017)  . OPHTHALMOLOGY EXAM  10/19/2018 (Originally 05/04/1951)  . TETANUS/TDAP  10/23/2018 (Originally 09/04/2016)  . HEMOGLOBIN A1C  04/22/2018  . URINE MICROALBUMIN  10/23/2018  . DEXA SCAN  Completed  . PNA vac Low Risk Adult  Completed      Plan:     I have personally reviewed, addressed, and noted the following in the patient's chart:  A. Medical and social history B. Use of alcohol, tobacco or illicit drugs  C.  Current medications and supplements D. Functional ability and status E.  Nutritional status F.  Physical activity G. Advance directives H. List of other physicians I.  Hospitalizations, surgeries, and ER visits in previous 12 months J.  [place] to include hearing,  vision, cognitive, depression L. Referrals and appointments - none  In addition, I have reviewed and discussed with patient certain preventive protocols, quality metrics, and best practice recommendations. A written personalized care plan for preventive services as well as general preventive health recommendations were provided to patient.  See attached scanned questionnaire for additional information.   Signed,   Lindell Noe, MHA, BS, LPN Health Coach

## 2017-10-27 ENCOUNTER — Other Ambulatory Visit: Payer: Self-pay | Admitting: Family Medicine

## 2017-10-27 ENCOUNTER — Encounter: Payer: Self-pay | Admitting: Family Medicine

## 2017-10-27 ENCOUNTER — Ambulatory Visit (INDEPENDENT_AMBULATORY_CARE_PROVIDER_SITE_OTHER): Payer: Medicare Other | Admitting: Family Medicine

## 2017-10-27 VITALS — BP 124/64 | HR 66 | Temp 98.2°F | Ht 66.25 in | Wt 197.5 lb

## 2017-10-27 DIAGNOSIS — K219 Gastro-esophageal reflux disease without esophagitis: Secondary | ICD-10-CM

## 2017-10-27 DIAGNOSIS — Z23 Encounter for immunization: Secondary | ICD-10-CM | POA: Diagnosis not present

## 2017-10-27 DIAGNOSIS — M069 Rheumatoid arthritis, unspecified: Secondary | ICD-10-CM | POA: Diagnosis not present

## 2017-10-27 DIAGNOSIS — Z0000 Encounter for general adult medical examination without abnormal findings: Secondary | ICD-10-CM | POA: Diagnosis not present

## 2017-10-27 DIAGNOSIS — R1084 Generalized abdominal pain: Secondary | ICD-10-CM

## 2017-10-27 DIAGNOSIS — E118 Type 2 diabetes mellitus with unspecified complications: Secondary | ICD-10-CM | POA: Diagnosis not present

## 2017-10-27 DIAGNOSIS — Z1231 Encounter for screening mammogram for malignant neoplasm of breast: Secondary | ICD-10-CM | POA: Insufficient documentation

## 2017-10-27 DIAGNOSIS — I10 Essential (primary) hypertension: Secondary | ICD-10-CM | POA: Diagnosis not present

## 2017-10-27 MED ORDER — FLUTICASONE PROPIONATE 50 MCG/ACT NA SUSP: NASAL | 3 refills | Status: DC

## 2017-10-27 MED ORDER — AMLODIPINE BESYLATE 5 MG PO TABS: 5.0000 mg | ORAL_TABLET | Freq: Every day | ORAL | 3 refills | Status: DC

## 2017-10-27 NOTE — Assessment & Plan Note (Signed)
Some imp with protonix For f/u with [facility]

## 2017-10-27 NOTE — Assessment & Plan Note (Signed)
Continues rheum f/u No longer on prednisone

## 2017-10-27 NOTE — Assessment & Plan Note (Signed)
Reviewed health habits including diet and exercise and skin cancer prevention Reviewed appropriate screening tests for age  Also reviewed health mt list, fam hx and immunization status , as well as social and family history   See HPI Labs reviewed  She will schedule own eye exam  Ref done for mammogram  Will look into tetanus shot at pharmacy Disc need for exercise

## 2017-10-27 NOTE — Assessment & Plan Note (Signed)
Scheduled annual screening mammogram Nl breast exam today  Encouraged monthly self exams   

## 2017-10-27 NOTE — Assessment & Plan Note (Signed)
Lab Results  Component Value Date   HGBA1C 6.7 (H) 10/23/2017   microalb is elevated  Diet controlled-literature given today on eye and foot care, diet and exercise  Not a candidate for ace due to anaphylaxis with bee venom  F/u 6 mo  Consider dm teaching Enc wt loss  She will schedule her eye exam

## 2017-10-27 NOTE — Progress Notes (Addendum)
Subjective:    Patient ID: [name], female    DOB: 04-10-1941, 77 y.o.   MRN: [id number]  HPI Here for health maintenance exam and to review chronic medical problems  Still has abd pain - seen at [facility] and was diag with acid reflux  Changed to protonix Has f/u in 2-3 mo  Still some pain but improved  Did CT and EGD   Wt Readings from Last 3 Encounters:  10/27/17 197 lb 8 oz (89.6 kg)  10/23/17 192 lb 12 oz (87.4 kg)  08/10/17 194 lb 8 oz (88.2 kg)  not eating any differently / ? 192 last time /wearing shoes  No regular exercise  31.64 kg/m     Had amw 1/4 Failed hearing screen R ear - does not bother her and does not want hearing aides  Vision is 20/25 PHQ score 2  Flu vaccine - will do today   Eye exam - is due  She will get her own appt   Tetanus vaccine - will check into at the pharmacy   Mammogram 5/17 - she needs to get her appt (did not get it due to other medical problems)  Self breast exam -no lumps  Sister had breast cancer   microalb 1/19   dexa 12/17-normal bmd   Colon cancer screen  Colonoscopy 6/17   Recall 5 y? - family hx  Sister had colon cancer    bp is stable today  No cp or palpitations or headaches or edema  No side effects to medicines  BP Readings from Last 3 Encounters:  10/27/17 124/64  10/23/17 132/68  08/10/17 136/78      DM2 Lab Results  Component Value Date   HGBA1C 6.7 (H) 10/23/2017   This is down from 6.9  Lab Results  Component Value Date   MICROALBUR 2.6 (H) 10/23/2017   Foot care  Eye exam -planning  She takes sugar in coffee and tea  She does not eat sweets as a rule  Loves pasta  Does eat salads   RA- about the same  No longer on prednisone    Lab Results  Component Value Date   WBC 5.8 10/23/2017   HGB 13.3 10/23/2017   HCT 40.8 10/23/2017   MCV 85.8 10/23/2017   PLT 261.0 10/23/2017    Lab Results  Component Value Date   CREATININE 0.84 10/23/2017   BUN 7 10/23/2017   NA 137  10/23/2017   K 3.5 10/23/2017   CL 101 10/23/2017   CO2 27 10/23/2017   Lab Results  Component Value Date   ALT 10 10/23/2017   AST 18 10/23/2017   ALKPHOS 75 10/23/2017   BILITOT 0.5 10/23/2017     Cholesterol  Lab Results  Component Value Date   CHOL 117 10/23/2017   CHOL 160 10/07/2016   CHOL 165 12/14/2015   Lab Results  Component Value Date   HDL 35.00 (L) 10/23/2017   HDL 44.60 10/07/2016   HDL 49 12/14/2015   Lab Results  Component Value Date   LDLCALC 50 10/23/2017   LDLCALC 78 10/07/2016   LDLCALC 78 12/14/2015   Lab Results  Component Value Date   TRIG 160.0 (H) 10/23/2017   TRIG 188.0 (H) 10/07/2016   TRIG 189 (H) 12/14/2015   Lab Results  Component Value Date   CHOLHDL 3 10/23/2017   CHOLHDL 4 10/07/2016   CHOLHDL 3.4 12/14/2015   No results found for: LDLDIRECT  Review of Systems  Constitutional: Positive for fatigue. Negative for activity change, appetite change, fever and unexpected weight change.  HENT: Negative for congestion, ear pain, rhinorrhea, sinus pressure and sore throat.   Eyes: Negative for pain, redness and visual disturbance.  Respiratory: Negative for cough, shortness of breath and wheezing.   Cardiovascular: Negative for chest pain and palpitations.  Gastrointestinal: Negative for abdominal pain, blood in stool, constipation and diarrhea.  Endocrine: Negative for polydipsia and polyuria.  Genitourinary: Negative for dysuria, frequency and urgency.  Musculoskeletal: Positive for arthralgias. Negative for back pain and myalgias.  Skin: Negative for pallor and rash.  Allergic/Immunologic: Negative for environmental allergies.  Neurological: Negative for dizziness, syncope and headaches.  Hematological: Negative for adenopathy. Does not bruise/bleed easily.  Psychiatric/Behavioral: Negative for decreased concentration and dysphoric mood. The patient is not nervous/anxious.        Objective:   Physical Exam  Constitutional:  She appears well-developed and well-nourished. No distress.  obese and well appearing   HENT:  Head: Normocephalic and atraumatic.  Right Ear: External ear normal.  Left Ear: External ear normal.  Mouth/Throat: Oropharynx is clear and moist.  Eyes: Conjunctivae and EOM are normal. Pupils are equal, round, and reactive to light. No scleral icterus.  Neck: Normal range of motion. Neck supple. No JVD present. Carotid bruit is not present. No thyromegaly present.  Cardiovascular: Normal rate, regular rhythm and intact distal pulses. Exam reveals no gallop.  Murmur heard. Pulmonary/Chest: Effort normal and breath sounds normal. No respiratory distress. She has no wheezes. She exhibits no tenderness.  Abdominal: Soft. Bowel sounds are normal. She exhibits no distension, no abdominal bruit and no mass. There is no tenderness.  Genitourinary: No breast swelling, tenderness, discharge or bleeding.  Genitourinary Comments: Breast exam: No mass, nodules, thickening, tenderness, bulging, retraction, inflamation, nipple discharge or skin changes noted.  No axillary or clavicular LA.      Musculoskeletal: She exhibits no edema or tenderness.  Joint changes of RA  Lymphadenopathy:    She has no cervical adenopathy.  Neurological: She is alert. She has normal reflexes. No cranial nerve deficit. She exhibits normal muscle tone. Coordination normal.  Skin: Skin is warm and dry. No rash noted. No erythema. No pallor.  Psychiatric: She has a normal mood and affect.          Assessment & Plan:   Problem List Items Addressed This Visit      Cardiovascular and Mediastinum   Essential hypertension    bp in fair control at this time  BP Readings from Last 1 Encounters:  10/27/17 124/64   No changes needed Disc lifstyle change with low sodium diet and exercise  Labs reviewed       Relevant Medications   amLODipine (NORVASC) 5 MG tablet   Other Relevant Orders   Comprehensive metabolic panel      Digestive   GERD    Changed to protonix by [facility] Will f/u  Some imp in this and abd pain        Endocrine   Diabetes mellitus with complication ([place])    Lab Results  Component Value Date   HGBA1C 6.7 (H) 10/23/2017   microalb is elevated  Diet controlled-literature given today on eye and foot care, diet and exercise  Not a candidate for ace due to anaphylaxis with bee venom  F/u 6 mo  Consider dm teaching Enc wt loss  She will schedule her eye exam      Relevant Orders   Hemoglobin  A1c     Musculoskeletal and Integument   RA (rheumatoid arthritis) ([facility])    Continues rheum f/u No longer on prednisone         Other   Abdominal pain    Some imp with protonix For f/u with [facility]      Encounter for screening mammogram for breast cancer    Scheduled annual screening mammogram Nl breast exam today  Encouraged monthly self exams        Routine general medical examination at a health care facility - Primary    Reviewed health habits including diet and exercise and skin cancer prevention Reviewed appropriate screening tests for age  Also reviewed health mt list, fam hx and immunization status , as well as social and family history   See HPI Labs reviewed  She will schedule own eye exam  Ref done for mammogram  Will look into tetanus shot at pharmacy Disc need for exercise        Other Visit Diagnoses    Need for influenza vaccination       Relevant Orders   Flu Vaccine QUAD 6+ mos PF IM (Fluarix Quad PF) (Completed)

## 2017-10-27 NOTE — Assessment & Plan Note (Signed)
Changed to protonix by [facility] Will f/u  Some imp in this and abd pain

## 2017-10-27 NOTE — Assessment & Plan Note (Signed)
bp in fair control at this time  BP Readings from Last 1 Encounters:  10/27/17 124/64   No changes needed Disc lifstyle change with low sodium diet and exercise  Labs reviewed

## 2017-10-27 NOTE — Patient Instructions (Addendum)
Exercise is important for general health and energy  Think about starting a walking program   You need a diabetic eye exam every year  Make sure to call your eye doctor to get your appointment   See about getting a tetanus shot at your pharmacy   For diabetes Stop sugar in drinks / avoid juice also  Loosing weight can cure diabetes  Avoid sweets   Try to get most of your carbohydrates from produce (with the exception of white potatoes)  Eat less bread/pasta/rice/snack foods/cereals/sweets and other items from the middle of the grocery store (processed carbs)   Stop at check out for a mammogram referral   Flu shot today   Follow up in 6 months

## 2017-11-18 ENCOUNTER — Ambulatory Visit
Admission: RE | Admit: 2017-11-18 | Discharge: 2017-11-18 | Disposition: A | Discharge status: Home / Self Care | Payer: Medicare Other | Source: Ambulatory Visit | Attending: Family Medicine | Admitting: Family Medicine

## 2017-11-18 DIAGNOSIS — Z1231 Encounter for screening mammogram for malignant neoplasm of breast: Secondary | ICD-10-CM | POA: Diagnosis not present

## 2017-11-23 ENCOUNTER — Ambulatory Visit: Payer: Medicare Other | Admitting: Family Medicine

## 2017-11-25 ENCOUNTER — Ambulatory Visit (INDEPENDENT_AMBULATORY_CARE_PROVIDER_SITE_OTHER): Payer: Medicare Other | Admitting: Family Medicine

## 2017-11-25 ENCOUNTER — Other Ambulatory Visit: Payer: Self-pay

## 2017-11-25 ENCOUNTER — Encounter: Payer: Self-pay | Admitting: Family Medicine

## 2017-11-25 VITALS — BP 138/76 | HR 77 | Temp 98.4°F | Ht 66.25 in | Wt 194.8 lb

## 2017-11-25 DIAGNOSIS — M05761 Rheumatoid arthritis with rheumatoid factor of right knee without organ or systems involvement: Secondary | ICD-10-CM | POA: Diagnosis not present

## 2017-11-25 DIAGNOSIS — M05762 Rheumatoid arthritis with rheumatoid factor of left knee without organ or systems involvement: Secondary | ICD-10-CM

## 2017-11-25 MED ORDER — METHYLPREDNISOLONE ACETATE 40 MG/ML IJ SUSP
80.0000 mg | Freq: Once | INTRAMUSCULAR | Status: AC
  Administered 2017-11-25: 80 mg via INTRA_ARTICULAR

## 2017-11-25 NOTE — Progress Notes (Signed)
Dr. Frederico Hamman T. Marleny Faller, MD, [facility] [place], [address] Phone: [phone] Fax: [phone]  11/25/2017  Patient: [name], MRN: [id number], DOB: 1941-09-15, 77 y.o.  Primary Physician:  Tower, Wynelle Fanny, MD   Chief Complaint  Patient presents with  . Knee Pain    Bilateral  . Back Pain   Subjective:   [name] is a 77 y.o. very pleasant female patient who presents with the following:  B Knee pain, RA.  The patient is here for evaluation of bilateral knee pain.  She has known rheumatoid arthritis and known disease of her knees, and she is primarily here because she has had some significant and relatively severe pain ambulating and is having trouble getting around.  She is accompanied by her husband.  She also has been having back pain.  She reports good compliance with her medications including all of her rheumatological medications.  Both knees are hurting, still taking MTX.   Past Medical History, Surgical History, Social History, Family History, Problem List, Medications, and Allergies have been reviewed and updated if relevant.  Patient Active Problem List   Diagnosis Date Noted  . Encounter for screening mammogram for breast cancer 10/27/2017  . Routine general medical examination at a health care facility 10/27/2017  . Abdominal pain 03/31/2017  . Aortic atherosclerosis ([place]) 02/03/2017  . RA (rheumatoid arthritis) ([place])   . Diabetes mellitus with complication ([place])   . Adjustment disorder with mixed anxiety and depressed mood 06/24/2016  . Essential hypertension 01/07/2016  . Coronary atherosclerosis 11/07/2015  . GERD 07/09/2007    Past Medical History:  Diagnosis Date  . Allergic rhinitis   . Anxiety   . Cataract 2015   bilateral  . Diabetes mellitus with complication ([place])   . Diverticulosis   . Esophageal reflux   . Essential hypertension 01/07/2016  . Fatty liver 2018  .  Fibula fracture 02/2005  . Former smoker   . Gastritis 2003  . Heart murmur   . Hemorrhoids   . History of nephrolithiasis   . Hyperglycemia   . Hyperlipemia   . Osteoarthritis   . Personal history of colonic polyps 1998   hyperplastic  . RA (rheumatoid arthritis) ([place])   . Shingles 2012  . Uterine fibroid     Past Surgical History:  Procedure Laterality Date  . CHOLECYSTECTOMY    . COLONOSCOPY  11/08   internal hemorrhoids  . EP procedure  1994   SVT; normal echo  . ESOPHAGOGASTRODUODENOSCOPY  11/03   reactive gastropathy  . LIPOMA EXCISION  11/2002  . SVT s/p surgery--? ablation    . UPPER GASTROINTESTINAL ENDOSCOPY    . vaginal polyp excised  12/2000   benign    Social History   Socioeconomic History  . Marital status: Married    Spouse name: Not on file  . Number of children: 3  . Years of education: Not on file  . Highest education level: Not on file  Social Needs  . Financial resource strain: Not on file  . Food insecurity - worry: Not on file  . Food insecurity - inability: Not on file  . Transportation needs - medical: Not on file  . Transportation needs - non-medical: Not on file  Occupational History  . Occupation: retired    Fish farm manager: RETIRED  Tobacco Use  . Smoking status: Former Smoker    Last attempt to quit: 10/21/2003  Years since quitting: 14.1  . Smokeless tobacco: Never Used  Substance and Sexual Activity  . Alcohol use: No    Alcohol/week: 0.0 oz  . Drug use: No  . Sexual activity: Yes  Other Topics Concern  . Not on file  Social History Narrative   Married      Retired      Occasional caffeine      No regular exercise    Family History  Problem Relation Age of Onset  . Colon cancer Father 39  . Diabetes Mother   . Heart disease Mother   . Kidney disease Mother        ESRD  . Stroke Mother   . Hypertension Brother   . Breast cancer Sister   . Diabetes Other        nephews  . Colon cancer Sister   . Esophageal cancer  Neg Hx   . Stomach cancer Neg Hx   . Rectal cancer Neg Hx     Allergies  Allergen Reactions  . Bee Venom Anaphylaxis  . Other Anaphylaxis  . Hctz [Hydrochlorothiazide] Other (See Comments)    Felt weak all over  Felt weak all over   . Zyrtec Allergy [Cetirizine Hcl]     Doesn't help    Medication list reviewed and updated in full in [place].  GEN: No fevers, chills. Nontoxic. Primarily MSK c/o today. MSK: Detailed in the HPI GI: tolerating PO intake without difficulty Neuro: No numbness, parasthesias, or tingling associated. Otherwise the pertinent positives of the ROS are noted above.   Objective:   BP 138/76   Pulse 77   Temp 98.4 F (36.9 C) (Oral)   Ht 5' 6.25" (1.683 m)   Wt 194 lb 12 oz (88.3 kg)   BMI 31.20 kg/m    GEN: WDWN, NAD, Non-toxic, Alert & Oriented x 3 HEENT: Atraumatic, Normocephalic.  Ears and Nose: No external deformity. EXTR: No clubbing/cyanosis/edema NEURO: Normal gait. antalgia PSYCH: Normally interactive. Conversant. Not depressed or anxious appearing.  Calm demeanor.    Both knees have minimal to mild effusion only.  The lack 1-2 degrees of extension.  Flexion to 110 degrees.  Significant pain on medial lateral joint lines bilaterally.  MCL, LCL, ACL, PCL are stable bilaterally.  Both flexion pinch and McMurray's causes pain.  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  Understanding that ultimately this does not modify the disease course of rheumatoid arthritis.  Chest my opinion about other medications, and if she is still having symptoms and failing methotrexate, I think she could have a  discussion about Biologics with Dr. Jefm Bryant, and I suspect he is already had these with her, too.  Knee Injection, RIGHT Patient verbally consented to procedure. Risks (including potential rare risk of infection), benefits, and alternatives explained. Sterilely prepped with Chloraprep. Ethyl cholride used for anesthesia. 8 cc Lidocaine 1% mixed with 2 mL Depo-Medrol 40 mg injected using the anteromedial approach without difficulty. No complications with procedure and tolerated well. Patient had decreased pain post-injection.   Knee Injection, LEFT Patient verbally consented to procedure. Risks (including potential rare risk of infection), benefits, and alternatives explained. Sterilely prepped with Chloraprep. Ethyl cholride used  for anesthesia. 8 cc Lidocaine 1% mixed with 2 mL Depo-Medrol 40 mg injected using the anteromedial approach without difficulty. No complications with procedure and tolerated well. Patient had decreased pain post-injection.    Follow-up: No Follow-up on file.  Meds ordered this encounter  Medications  . methylPREDNISolone acetate (DEPO-MEDROL) injection 80 mg  . methylPREDNISolone acetate (DEPO-MEDROL) injection 80 mg   Signed,  Rosalie Gelpi T. Derald Lorge, MD   Allergies as of 11/25/2017      Reactions   Bee Venom Anaphylaxis   Other Anaphylaxis   Hctz [hydrochlorothiazide] Other (See Comments)   Felt weak all over  Felt weak all over    Zyrtec Allergy [cetirizine Hcl]    Doesn't help      Medication List        Accurate as of 11/25/17 11:59 PM. Always use your most recent med list.          acetaminophen 500 MG tablet Commonly known as:  TYLENOL Take 500 mg by mouth every 6 (six) hours as needed.   albuterol 108 (90 Base) MCG/ACT inhaler Commonly known as:  PROVENTIL HFA;VENTOLIN HFA Inhale 2 puffs into the lungs every 4 (four) hours as needed for wheezing or shortness of breath. Please give spacer if possible   amLODipine 5 MG tablet Commonly known  as:  NORVASC Take 1 tablet (5 mg total) by mouth daily.   cyclobenzaprine 5 MG tablet Commonly known as:  FLEXERIL   dicyclomine 20 MG tablet Commonly known as:  BENTYL Take 1 tablet (20 mg total) by mouth 4 (four) times daily -  before meals and at bedtime.   docusate sodium 100 MG capsule Commonly known as:  COLACE Take 1 capsule (100 mg total) by mouth 2 (two) times daily.   EPIPEN IJ Inject as directed as needed. Reported on 12/05/2015   feeding supplement Liqd Take 1 Container by mouth 3 (three) times daily between meals.   fluticasone 50 MCG/ACT nasal spray Commonly known as:  FLONASE PLACE 2 SPRAYS INTO THE NOSE DAILY.   folic acid 1 MG tablet Commonly known as:  FOLVITE Take 1 mg by mouth daily.   hydrocortisone 25 MG suppository Commonly known as:  ANUSOL-HC Place 1 suppository (25 mg total) rectally 2 (two) times daily. For 7 days   loratadine 10 MG tablet Commonly known as:  CLARITIN Take 10 mg by mouth daily.   methotrexate 2.5 MG tablet Commonly known as:  RHEUMATREX Take 15 mg by mouth once a week. Caution:Chemotherapy. Protect from light. ( Monday of each week )   MYLANTA PO Take 1 capsule by mouth as directed.   pantoprazole 40 MG tablet Commonly known as:  PROTONIX Take 40 mg by mouth daily.   polyethylene glycol packet Commonly known as:  MIRALAX / GLYCOLAX Take 17 g by mouth daily.   traMADol 50 MG tablet Commonly known as:  ULTRAM Take 1 tablet (50 mg total) by mouth every 8 (eight) hours as needed.

## 2017-12-08 DIAGNOSIS — M0579 Rheumatoid arthritis with rheumatoid factor of multiple sites without organ or systems involvement: Secondary | ICD-10-CM | POA: Diagnosis not present

## 2017-12-08 DIAGNOSIS — Z79899 Other long term (current) drug therapy: Secondary | ICD-10-CM | POA: Diagnosis not present

## 2017-12-14 DIAGNOSIS — M0579 Rheumatoid arthritis with rheumatoid factor of multiple sites without organ or systems involvement: Secondary | ICD-10-CM | POA: Diagnosis not present

## 2017-12-14 DIAGNOSIS — Z79899 Other long term (current) drug therapy: Secondary | ICD-10-CM | POA: Diagnosis not present

## 2017-12-14 DIAGNOSIS — M545 Low back pain: Secondary | ICD-10-CM | POA: Diagnosis not present

## 2018-03-03 ENCOUNTER — Other Ambulatory Visit: Payer: Self-pay | Admitting: Gastroenterology

## 2018-03-09 ENCOUNTER — Encounter: Payer: Self-pay | Admitting: *Deleted

## 2018-03-10 ENCOUNTER — Encounter: Payer: Self-pay | Admitting: Family Medicine

## 2018-03-10 ENCOUNTER — Ambulatory Visit (INDEPENDENT_AMBULATORY_CARE_PROVIDER_SITE_OTHER): Payer: Medicare Other | Admitting: Family Medicine

## 2018-03-10 VITALS — BP 140/84 | HR 78 | Temp 98.3°F | Ht 66.25 in | Wt 194.0 lb

## 2018-03-10 DIAGNOSIS — M05762 Rheumatoid arthritis with rheumatoid factor of left knee without organ or systems involvement: Secondary | ICD-10-CM

## 2018-03-10 DIAGNOSIS — M05761 Rheumatoid arthritis with rheumatoid factor of right knee without organ or systems involvement: Secondary | ICD-10-CM | POA: Diagnosis not present

## 2018-03-10 MED ORDER — METHYLPREDNISOLONE ACETATE 40 MG/ML IJ SUSP
80.0000 mg | Freq: Once | INTRAMUSCULAR | Status: AC
  Administered 2018-03-10: 80 mg via INTRA_ARTICULAR

## 2018-03-10 NOTE — Progress Notes (Signed)
Dr. Frederico Hamman T. Mayank Teuscher, MD, [facility] [place], [address] Phone: [phone] Fax: [phone]  03/10/2018  Patient: [name], MRN: [id number], DOB: [birth year], 77 y.o.  Primary Physician:  Tower, Wynelle Fanny, MD   Chief Complaint  Patient presents with  . Hand Pain    Bilateral Middle Fingers  . Knee Pain    Bilateral-radiates down leg   Subjective:   [name] is a 77 y.o. very pleasant female patient who presents with the following:  RA, followed by Merita Norton.   I last saw her about 3-1/2 months ago.  At that point I did do bilateral corticosteroid injections of her knees.  She has been seeing a rheumatologist, and reports compliance with her methotrexate.  Apparently in the past there is been some concern of possible compliance with this.  She has some swelling and pain in her hands and MCP joints currently.  She also has pain in her knees bilaterally.  11/25/2017 Last OV with Owens Loffler, MD   Past Medical History, Surgical History, Social History, Family History, Problem List, Medications, and Allergies have been reviewed and updated if relevant.  Patient Active Problem List   Diagnosis Date Noted  . Encounter for screening mammogram for breast cancer 10/27/2017  . Routine general medical examination at a health care facility 10/27/2017  . Abdominal pain 03/31/2017  . Aortic atherosclerosis ([place]) 02/03/2017  . RA (rheumatoid arthritis) ([place])   . Diabetes mellitus with complication ([place])   . Adjustment disorder with mixed anxiety and depressed mood 06/24/2016  . Essential hypertension 01/07/2016  . Coronary atherosclerosis 11/07/2015  . GERD 07/09/2007    Past Medical History:  Diagnosis Date  . Allergic rhinitis   . Anxiety   . Cataract 2015   bilateral  . Diabetes mellitus with complication ([place])   . Diverticulosis   . Esophageal reflux   . Essential hypertension 01/07/2016  .  Fatty liver 2018  . Fibula fracture 02/2005  . Former smoker   . Gastritis 2003  . Heart murmur   . Hemorrhoids   . History of nephrolithiasis   . Hyperglycemia   . Hyperlipemia   . Osteoarthritis   . Personal history of colonic polyps 1998   hyperplastic  . RA (rheumatoid arthritis) ([place])   . Shingles 2012  . Uterine fibroid     Past Surgical History:  Procedure Laterality Date  . CHOLECYSTECTOMY    . COLONOSCOPY  11/08   internal hemorrhoids  . EP procedure  1994   SVT; normal echo  . ESOPHAGOGASTRODUODENOSCOPY  11/03   reactive gastropathy  . LIPOMA EXCISION  11/2002  . SVT s/p surgery--? ablation    . UPPER GASTROINTESTINAL ENDOSCOPY    . vaginal polyp excised  12/2000   benign    Social History   Socioeconomic History  . Marital status: Married    Spouse name: Not on file  . Number of children: 3  . Years of education: Not on file  . Highest education level: Not on file  Occupational History  . Occupation: retired    Fish farm manager: RETIRED  Social Needs  . Financial resource strain: Not on file  . Food insecurity:    Worry: Not on file    Inability: Not on file  . Transportation needs:    Medical: Not on file    Non-medical: Not on file  Tobacco Use  . Smoking status: Former Smoker  Last attempt to quit: 10/21/2003    Years since quitting: 14.3  . Smokeless tobacco: Never Used  Substance and Sexual Activity  . Alcohol use: No    Alcohol/week: 0.0 oz  . Drug use: No  . Sexual activity: Yes  Lifestyle  . Physical activity:    Days per week: Not on file    Minutes per session: Not on file  . Stress: Not on file  Relationships  . Social connections:    Talks on phone: Not on file    Gets together: Not on file    Attends religious service: Not on file    Active member of club or organization: Not on file    Attends meetings of clubs or organizations: Not on file    Relationship status: Not on file  . Intimate partner violence:    Fear of current or  ex partner: Not on file    Emotionally abused: Not on file    Physically abused: Not on file    Forced sexual activity: Not on file  Other Topics Concern  . Not on file  Social History Narrative   Married      Retired      Occasional caffeine      No regular exercise    Family History  Problem Relation Age of Onset  . Colon cancer Father 84  . Diabetes Mother   . Heart disease Mother   . Kidney disease Mother        ESRD  . Stroke Mother   . Hypertension Brother   . Breast cancer Sister   . Diabetes Other        nephews  . Colon cancer Sister   . Esophageal cancer Neg Hx   . Stomach cancer Neg Hx   . Rectal cancer Neg Hx     Allergies  Allergen Reactions  . Bee Venom Anaphylaxis  . Other Anaphylaxis  . Hctz [Hydrochlorothiazide] Other (See Comments)    Felt weak all over  Felt weak all over   . Zyrtec Allergy [Cetirizine Hcl]     Doesn't help    Medication list reviewed and updated in full in [place].  GEN: No fevers, chills. Nontoxic. Primarily MSK c/o today. MSK: Detailed in the HPI GI: tolerating PO intake without difficulty Neuro: No numbness, parasthesias, or tingling associated. Otherwise the pertinent positives of the ROS are noted above.   Objective:   BP 140/84   Pulse 78   Temp 98.3 F (36.8 C) (Oral)   Ht 5' 6.25" (1.683 m)   Wt 194 lb (88 kg)   BMI 31.08 kg/m    GEN: WDWN, NAD, Non-toxic, Alert & Oriented x 3 HEENT: Atraumatic, Normocephalic.  Ears and Nose: No external deformity. EXTR: No clubbing/cyanosis/edema NEURO: Normal gait.  PSYCH: Normally interactive. Conversant. Not depressed or anxious appearing.  Calm demeanor.    Bilateral knee pain on exam with significant pain with patellar compression as well as pain on the medial and lateral joint lines.  There is minimal to mild effusion only.  Stable MCL, LCL, ACL, and PCL.  Patient also has some bogginess in the hands notably in the MCP joints and bilaterally as well  as in both wrist joints.  Radiology: No results found.  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  This would best be directed towards her rheumatologist, possibly using 1 of the newer biologic agents may be an option, but this is really out of my expertise.  I think it is probably reasonable to do a repeat of her knee injections for quality of life in a patient who is almost 77 years old.  Knee Injection, RIGHT Patient verbally consented to procedure. Risks (including potential rare risk of infection), benefits, and alternatives explained. Sterilely prepped with Chloraprep. Ethyl cholride used for anesthesia. 8 cc Lidocaine 1% mixed with 2 mL Depo-Medrol 40 mg injected using the anteromedial approach without difficulty. No complications with procedure and tolerated well. Patient had decreased pain post-injection.   Knee Injection, LEFT Patient verbally consented to procedure. Risks (including potential rare risk of infection), benefits, and alternatives explained. Sterilely prepped with Chloraprep. Ethyl cholride used for anesthesia. 8 cc Lidocaine 1% mixed with 2 mL Depo-Medrol 40 mg injected using the anteromedial approach without difficulty. No complications with procedure and tolerated well. Patient had decreased pain post-injection.    Follow-up: No follow-ups on file.  Meds ordered this encounter  Medications  . methylPREDNISolone acetate (DEPO-MEDROL) injection 80 mg  . methylPREDNISolone acetate (DEPO-MEDROL) injection 80 mg   Signed,  Rayli Wiederhold T. Mahitha Hickling, MD   Allergies as of 03/10/2018      Reactions   Bee Venom Anaphylaxis   Other Anaphylaxis   Hctz [hydrochlorothiazide] Other (See Comments)   Felt weak all over    Felt weak all over    Zyrtec Allergy [cetirizine Hcl]    Doesn't help      Medication List        Accurate as of 03/10/18 11:18 AM. Always use your most recent med list.          acetaminophen 500 MG tablet Commonly known as:  TYLENOL Take 500 mg by mouth every 6 (six) hours as needed.   albuterol 108 (90 Base) MCG/ACT inhaler Commonly known as:  PROVENTIL HFA;VENTOLIN HFA Inhale 2 puffs into the lungs every 4 (four) hours as needed for wheezing or shortness of breath. Please give spacer if possible   amLODipine 5 MG tablet Commonly known as:  NORVASC Take 1 tablet (5 mg total) by mouth daily.   cyclobenzaprine 5 MG tablet Commonly known as:  FLEXERIL   dicyclomine 20 MG tablet Commonly known as:  BENTYL Take 1 tablet (20 mg total) by mouth 4 (four) times daily -  before meals and at bedtime.   docusate sodium 100 MG capsule Commonly known as:  COLACE Take 1 capsule (100 mg total) by mouth 2 (two) times daily.   EPIPEN IJ Inject as directed as needed. Reported on 12/05/2015   feeding supplement Liqd Take 1 Container by mouth 3 (three) times daily between meals.   fluticasone 50 MCG/ACT nasal spray Commonly known as:  FLONASE PLACE 2 SPRAYS INTO THE NOSE DAILY.   folic acid 1 MG tablet Commonly known as:  FOLVITE Take 1 mg by mouth daily.   hydrocortisone 25 MG suppository Commonly known as:  ANUSOL-HC Place 1 suppository (25 mg total) rectally 2 (two) times daily. For 7 days   loratadine 10 MG tablet Commonly known as:  CLARITIN Take 10 mg by mouth daily.   methotrexate 2.5 MG tablet Commonly known as:  RHEUMATREX Take 15 mg by mouth once a week. Caution:Chemotherapy. Protect from light. ( Monday of each week )   MYLANTA PO Take 1 capsule by mouth as directed.  pantoprazole 40 MG tablet Commonly known as:  PROTONIX Take 40 mg by mouth daily.   polyethylene glycol packet Commonly known as:  MIRALAX / GLYCOLAX Take 17 g by mouth daily.    traMADol 50 MG tablet Commonly known as:  ULTRAM Take 1 tablet (50 mg total) by mouth every 8 (eight) hours as needed.

## 2018-03-16 DIAGNOSIS — Z79899 Other long term (current) drug therapy: Secondary | ICD-10-CM | POA: Diagnosis not present

## 2018-03-16 DIAGNOSIS — M0579 Rheumatoid arthritis with rheumatoid factor of multiple sites without organ or systems involvement: Secondary | ICD-10-CM | POA: Diagnosis not present

## 2018-04-15 ENCOUNTER — Telehealth: Payer: Self-pay | Admitting: Family Medicine

## 2018-04-15 NOTE — Telephone Encounter (Signed)
[name] dropped off FMLA paperwork  In dr tower's in box

## 2018-04-18 NOTE — Telephone Encounter (Signed)
Done and in IN box 

## 2018-04-19 IMAGING — CT CT ABD-PELV W/ CM
2 of 5 series · 16 of 46 positions shown, 18 images · IV contrast (Omni 300)
Comparison: CT abdomen pelvis of 11/02/2015

CLINICAL DATA: Lower abdomen pain, large amount of blood in stool
today and

EXAM:
CT ABDOMEN AND PELVIS WITH CONTRAST
TECHNIQUE: Multidetector CT imaging of the abdomen and pelvis was performed
using the standard protocol following bolus administration of
intravenous contrast.
CONTRAST:  100mL 8DOF5H-YUU IOPAMIDOL (8DOF5H-YUU) INJECTION 61%

[Series 5: a/p w/ 5mm · axial · 0.76mm/px · z∈[+1149,+1584]mm · 13 of 97 slices shown, 15 images]
[im 5/97  soft-tissue]
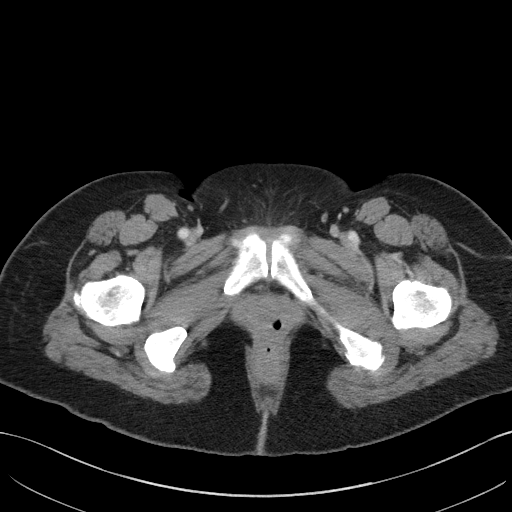
[im 5/97  bone]
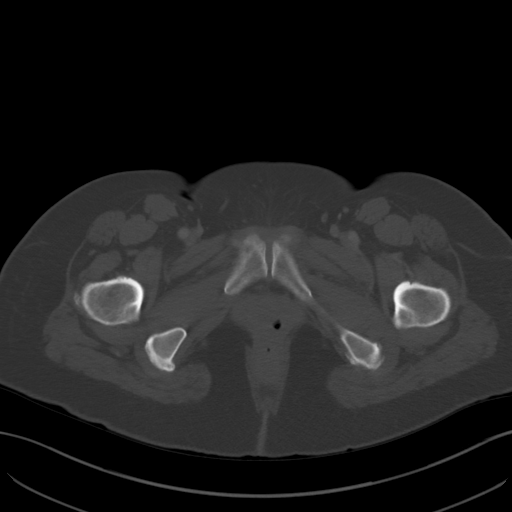
[im 14/97  soft-tissue]
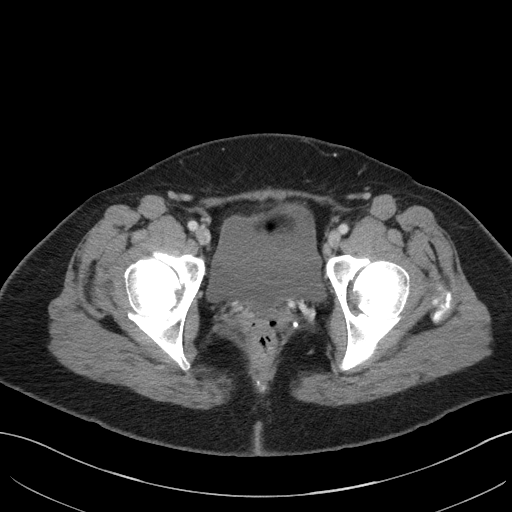
[im 19/97  soft-tissue]
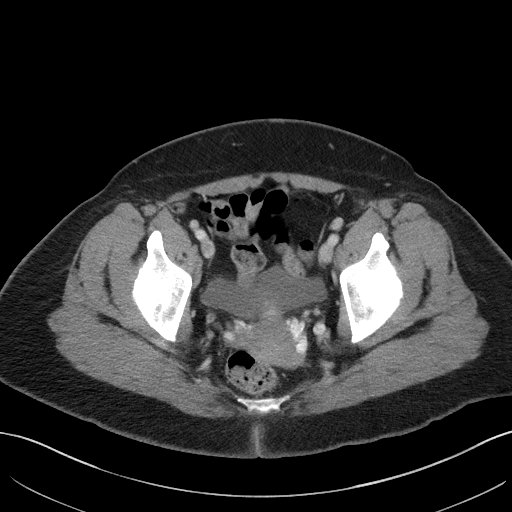
[im 28/97  soft-tissue]
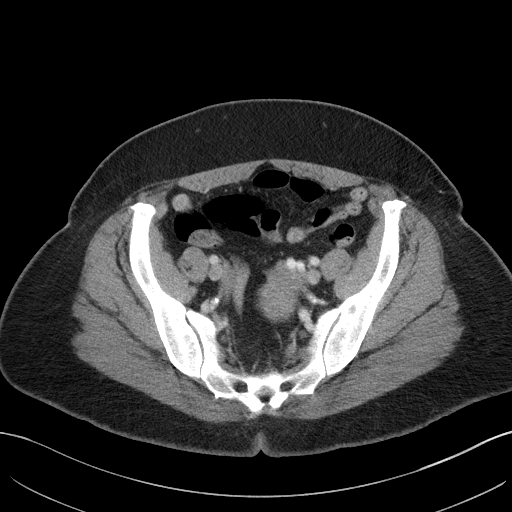
[im 33/97  soft-tissue]
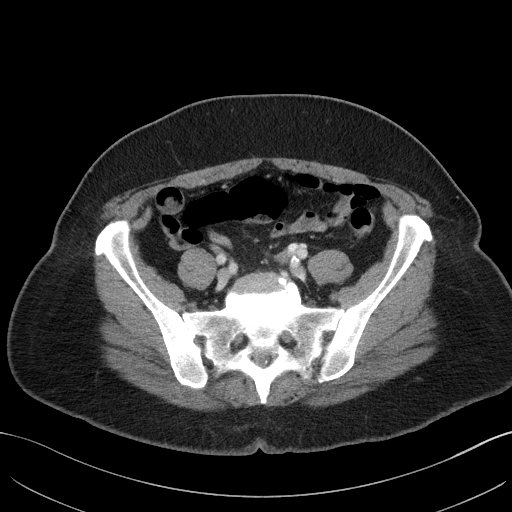
[im 42/97  soft-tissue]
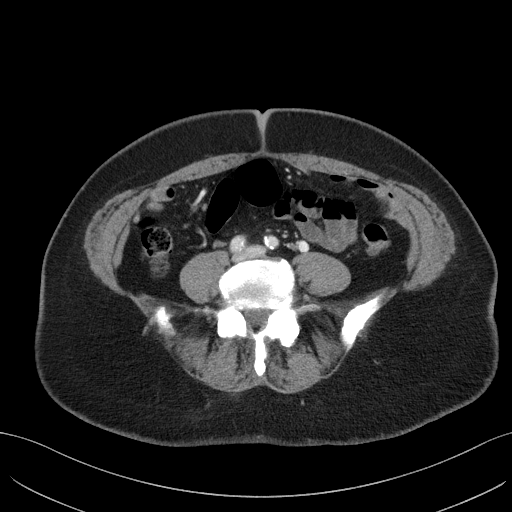
[im 51/97  soft-tissue]
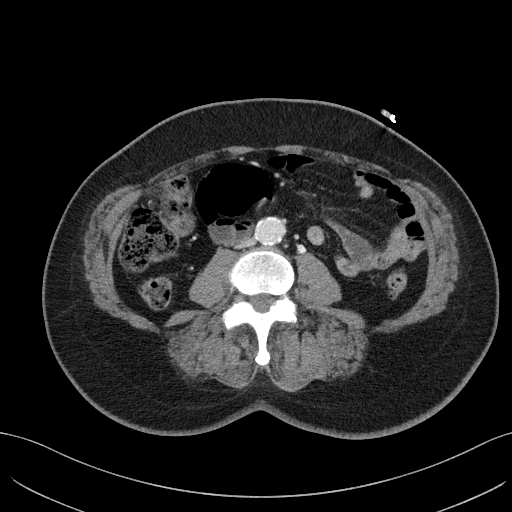
[im 55/97  soft-tissue]
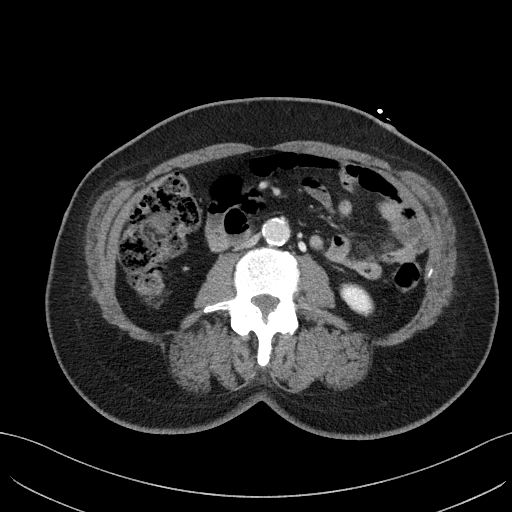
[im 65/97  soft-tissue]
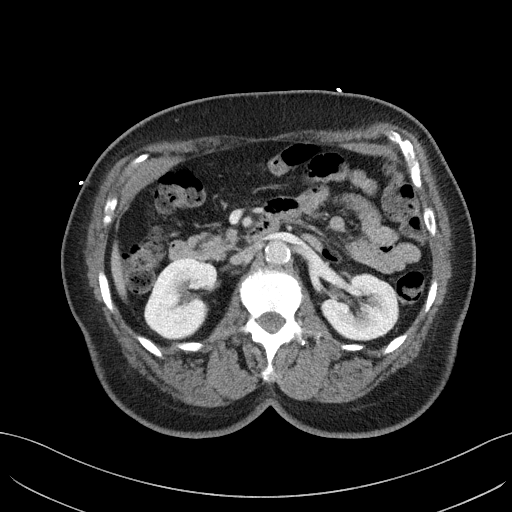
[im 65/97  bone]
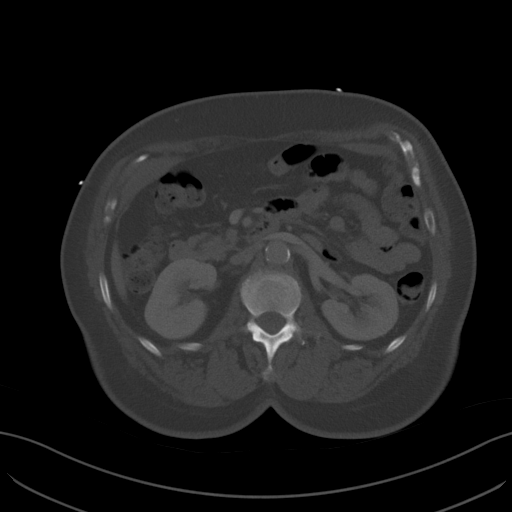
[im 69/97  soft-tissue]
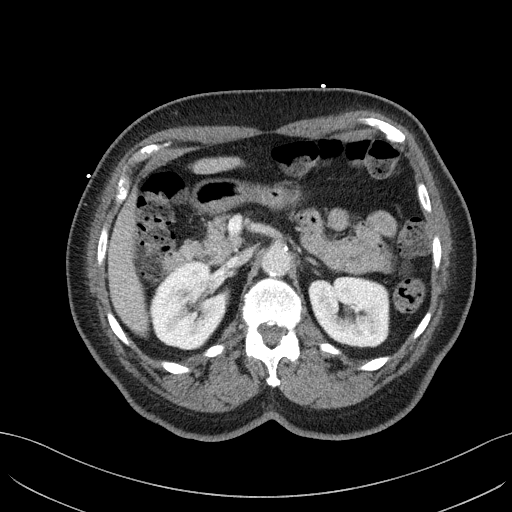
[im 78/97  soft-tissue]
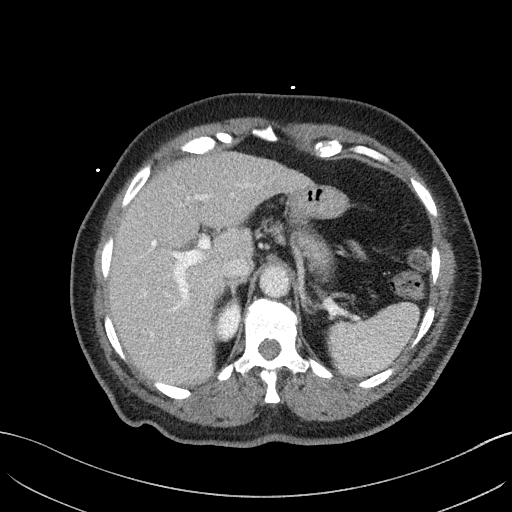
[im 83/97  soft-tissue]
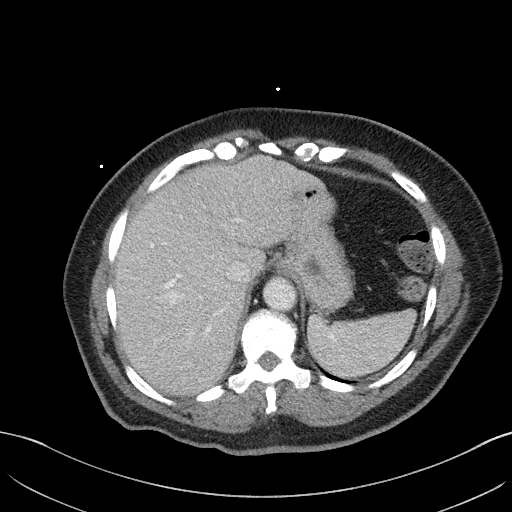
[im 92/97  soft-tissue]
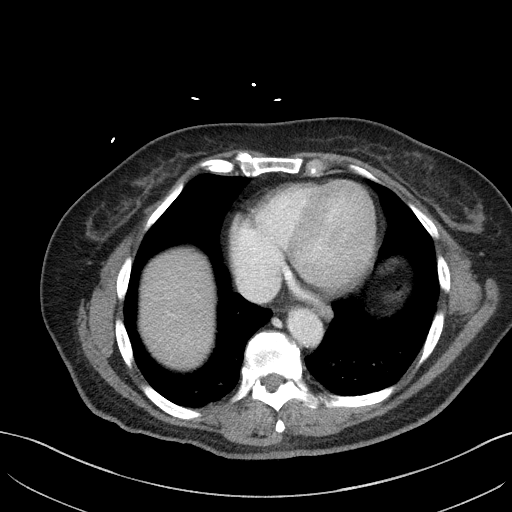

[Series 8: a/p w/ cor · coronal · 0.86mm/px · 3 of 151 slices shown]
[im 51/151  soft-tissue]
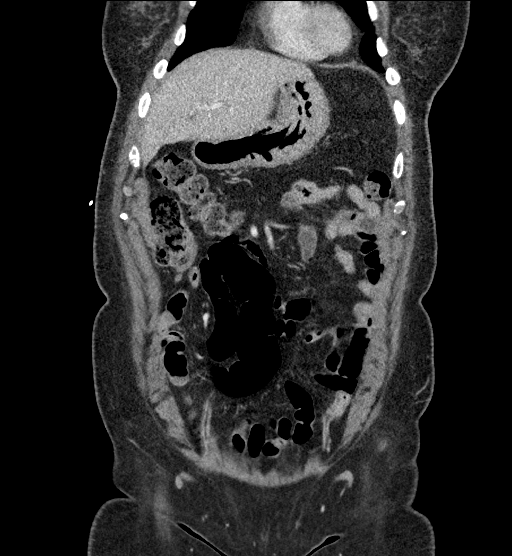
[im 67/151  soft-tissue]
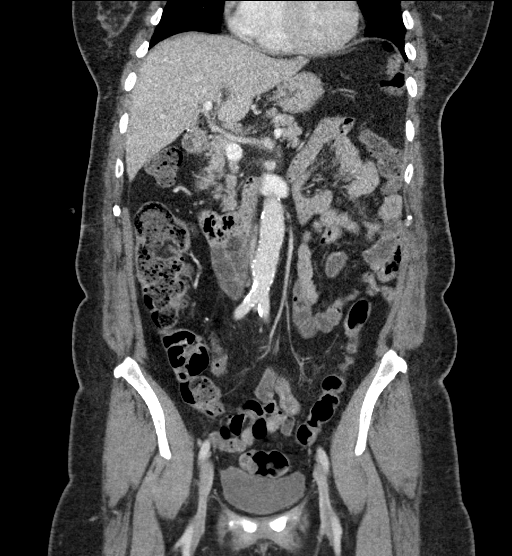
[im 84/151  soft-tissue]
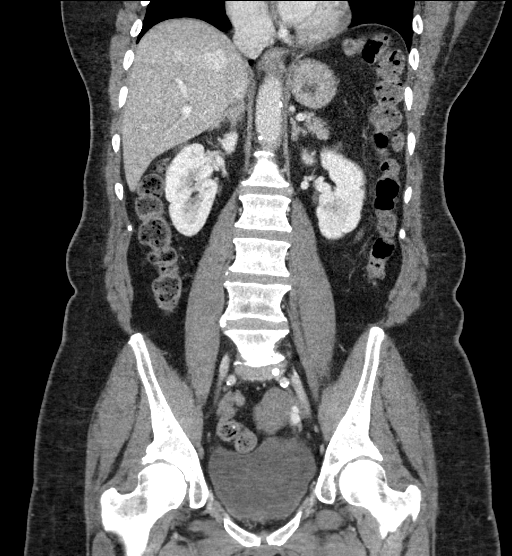

[16 of 46 positions shown; findings below may reference images not displayed]

FINDINGS: Lower chest: The lung bases clear. The heart is within normal limits
in size.

Hepatobiliary: The liver remains low in attenuation consistent with
fatty infiltration. Surgical clips are noted from prior
cholecystectomy.

Pancreas: The pancreas is normal in size and the pancreatic duct is
not dilated.

Spleen: The spleen is normal in size.

Adrenals/Urinary Tract: The adrenal glands are normal. The kidneys
enhance with no calculus or mass and no hydronephrosis is seen. On
delayed images, the pelvocaliceal systems are unremarkable. The
ureters are normal in caliber. The urinary bladder is unremarkable.

Stomach/Bowel: The stomach is decompressed. No small bowel
distention is seen. There are a few diverticula within the
rectosigmoid colon. No colonic lesion is seen to explain the bright
red blood per rectum. There is feces throughout the colon. The
terminal ileum and the appendix are somewhat high in position but
normal by CT.

Vascular/Lymphatic: The abdominal aorta is normal in caliber with
moderate abdominal aortic atherosclerosis present. No adenopathy is
seen.

Reproductive: The uterus is normal in size and calcified uterine
fibroids are noted. No adnexal lesion is seen. No free fluid is
noted within the pelvis.

Other: None.

Musculoskeletal: The lumbar vertebrae are in normal alignment with
degenerative disc disease primarily at L5-S1, with degenerative
changes in the facet joints of the lower lumbar spine as well.
IMPRESSION: 1. No explanation for the blood per rectum is seen other than a few
diverticula within the rectosigmoid colon. No mass is evident by CT.
2. Suspect fatty infiltration of the liver.  Correlate with LFTs.
3. Moderate abdominal aortic atherosclerosis.
4. Calcified uterine fibroids.

## 2018-04-19 NOTE — Telephone Encounter (Signed)
Paperwork faxed Per antoinette's request paperwork mailed to her  Copy for pt Copy for scan

## 2018-04-19 NOTE — Telephone Encounter (Signed)
Forms given to [place] for completion

## 2018-05-05 DIAGNOSIS — Z79899 Other long term (current) drug therapy: Secondary | ICD-10-CM | POA: Diagnosis not present

## 2018-05-05 DIAGNOSIS — G8929 Other chronic pain: Secondary | ICD-10-CM | POA: Diagnosis not present

## 2018-05-05 DIAGNOSIS — M25562 Pain in left knee: Secondary | ICD-10-CM | POA: Diagnosis not present

## 2018-05-05 DIAGNOSIS — M25561 Pain in right knee: Secondary | ICD-10-CM | POA: Diagnosis not present

## 2018-05-05 DIAGNOSIS — M0579 Rheumatoid arthritis with rheumatoid factor of multiple sites without organ or systems involvement: Secondary | ICD-10-CM | POA: Diagnosis not present

## 2018-05-26 DIAGNOSIS — Z961 Presence of intraocular lens: Secondary | ICD-10-CM | POA: Diagnosis not present

## 2018-06-14 ENCOUNTER — Ambulatory Visit: Payer: Medicare Other | Admitting: Physician Assistant

## 2018-06-29 DIAGNOSIS — Z79899 Other long term (current) drug therapy: Secondary | ICD-10-CM | POA: Diagnosis not present

## 2018-06-29 DIAGNOSIS — M0579 Rheumatoid arthritis with rheumatoid factor of multiple sites without organ or systems involvement: Secondary | ICD-10-CM | POA: Diagnosis not present

## 2018-07-06 DIAGNOSIS — M25561 Pain in right knee: Secondary | ICD-10-CM | POA: Diagnosis not present

## 2018-07-06 DIAGNOSIS — M0579 Rheumatoid arthritis with rheumatoid factor of multiple sites without organ or systems involvement: Secondary | ICD-10-CM | POA: Diagnosis not present

## 2018-07-06 DIAGNOSIS — G8929 Other chronic pain: Secondary | ICD-10-CM | POA: Diagnosis not present

## 2018-07-06 DIAGNOSIS — M545 Low back pain: Secondary | ICD-10-CM | POA: Diagnosis not present

## 2018-07-06 DIAGNOSIS — M25562 Pain in left knee: Secondary | ICD-10-CM | POA: Diagnosis not present

## 2018-07-27 ENCOUNTER — Ambulatory Visit (INDEPENDENT_AMBULATORY_CARE_PROVIDER_SITE_OTHER): Payer: Medicare Other | Admitting: Family Medicine

## 2018-07-27 ENCOUNTER — Encounter: Payer: Self-pay | Admitting: Family Medicine

## 2018-07-27 VITALS — BP 136/76 | HR 50 | Temp 98.2°F | Ht 66.25 in | Wt 199.0 lb

## 2018-07-27 DIAGNOSIS — I700 Atherosclerosis of aorta: Secondary | ICD-10-CM

## 2018-07-27 DIAGNOSIS — Z23 Encounter for immunization: Secondary | ICD-10-CM | POA: Diagnosis not present

## 2018-07-27 DIAGNOSIS — N898 Other specified noninflammatory disorders of vagina: Secondary | ICD-10-CM | POA: Diagnosis not present

## 2018-07-27 LAB — POCT WET PREP (WET MOUNT): Trichomonas Wet Prep HPF POC: ABSENT

## 2018-07-27 MED ORDER — TERCONAZOLE 0.8 % VA CREA: 1.0000 | TOPICAL_CREAM | Freq: Every day | VAGINAL | 0 refills | Status: DC

## 2018-07-27 NOTE — Progress Notes (Signed)
Subjective:    Patient ID: [name], female    DOB: 1941/10/12, 78 y.o.   MRN: [id number]  HPI Here for c/o of vaginal itching   Also wants flu shot   Wt Readings from Last 3 Encounters:  07/27/18 199 lb (90.3 kg)  03/10/18 194 lb (88 kg)  11/25/17 194 lb 12 oz (88.3 kg)   31.88 kg/m   Vaginal itching  A long time (on and off)  Vaginal discharge- is white in color  Gets a brown discoloration on "sides" of underwear  This is where she itches- on the outside  Skin looks dark in those areas and in between legs - not red  No internal itching  No open areas or peeling or cracking  A little bit of odor  Wears a small pad- for leaking urine with cough and sneeze   Pelvic pain - occasional  Feels crampy   Doubts chance of STD- but hx of infidelity on part of husband in the past Not sexually active very often at her age  Her partner has not told her he has jock itch or skin problems   Results for orders placed or performed in visit on 07/27/18  POCT Wet Prep [employer] [facility])  Result Value Ref Range   Source Wet Prep POC vag    WBC, Wet Prep HPF POC few    Bacteria Wet Prep HPF POC Few Few   BACTERIA WET PREP MORPHOLOGY POC     Clue Cells Wet Prep HPF POC None None   Clue Cells Wet Prep [place] POC     Yeast Wet Prep HPF POC Few (A) None   KOH Wet Prep POC None None   Trichomonas Wet Prep HPF POC Absent Absent     Patient Active Problem List   Diagnosis Date Noted  . Vaginal itching 07/27/2018  . Encounter for screening mammogram for breast cancer 10/27/2017  . Routine general medical examination at a health care facility 10/27/2017  . Abdominal pain 03/31/2017  . Aortic atherosclerosis ([place]) 02/03/2017  . RA (rheumatoid arthritis) ([place])   . Diabetes mellitus with complication ([place])   . Adjustment disorder with mixed anxiety and depressed mood 06/24/2016  . Essential hypertension 01/07/2016  . Coronary atherosclerosis 11/07/2015  . GERD 07/09/2007   Past  Medical History:  Diagnosis Date  . Allergic rhinitis   . Anxiety   . Cataract 2015   bilateral  . Diabetes mellitus with complication ([place])   . Diverticulosis   . Esophageal reflux   . Essential hypertension 01/07/2016  . Fatty liver 2018  . Fibula fracture 02/2005  . Former smoker   . Gastritis 2003  . Heart murmur   . Hemorrhoids   . History of nephrolithiasis   . Hyperglycemia   . Hyperlipemia   . Osteoarthritis   . Personal history of colonic polyps 1998   hyperplastic  . RA (rheumatoid arthritis) ([place])   . Shingles 2012  . Uterine fibroid    Past Surgical History:  Procedure Laterality Date  . CHOLECYSTECTOMY    . COLONOSCOPY  11/08   internal hemorrhoids  . EP procedure  1994   SVT; normal echo  . ESOPHAGOGASTRODUODENOSCOPY  11/03   reactive gastropathy  . LIPOMA EXCISION  11/2002  . SVT s/p surgery--? ablation    . UPPER GASTROINTESTINAL ENDOSCOPY    . vaginal polyp excised  12/2000   benign   Social History   Tobacco Use  . Smoking status: Former Smoker  Last attempt to quit: 10/21/2003    Years since quitting: 14.7  . Smokeless tobacco: Never Used  Substance Use Topics  . Alcohol use: No    Alcohol/week: 0.0 standard drinks  . Drug use: No   Family History  Problem Relation Age of Onset  . Colon cancer Father 14  . Diabetes Mother   . Heart disease Mother   . Kidney disease Mother        ESRD  . Stroke Mother   . Hypertension Brother   . Breast cancer Sister   . Diabetes Other        nephews  . Colon cancer Sister   . Esophageal cancer Neg Hx   . Stomach cancer Neg Hx   . Rectal cancer Neg Hx    Allergies  Allergen Reactions  . Bee Venom Anaphylaxis  . Other Anaphylaxis  . Hctz [Hydrochlorothiazide] Other (See Comments)    Felt weak all over  Felt weak all over   . Zyrtec Allergy [Cetirizine Hcl]     Doesn't help   Current Outpatient Medications on File Prior to Visit  Medication Sig Dispense Refill  . acetaminophen (TYLENOL) 500  MG tablet Take 500 mg by mouth every 6 (six) hours as needed.    Marland Kitchen albuterol (PROVENTIL HFA;VENTOLIN HFA) 108 (90 Base) MCG/ACT inhaler Inhale 2 puffs into the lungs every 4 (four) hours as needed for wheezing or shortness of breath. Please give spacer if possible 1 Inhaler 1  . amLODipine (NORVASC) 5 MG tablet Take 1 tablet (5 mg total) by mouth daily. 90 tablet 3  . Calcium & Magnesium Carbonates (MYLANTA PO) Take 1 capsule by mouth as directed.      . cyclobenzaprine (FLEXERIL) 5 MG tablet     . dicyclomine (BENTYL) 20 MG tablet Take 1 tablet (20 mg total) by mouth 4 (four) times daily -  before meals and at bedtime. 120 tablet 5  . docusate sodium (COLACE) 100 MG capsule Take 1 capsule (100 mg total) by mouth 2 (two) times daily. 60 capsule 0  . EPINEPHrine (EPIPEN IJ) Inject as directed as needed. Reported on 12/05/2015    . feeding supplement (BOOST / RESOURCE BREEZE) LIQD Take 1 Container by mouth 3 (three) times daily between meals. 90 Container 0  . fluticasone (FLONASE) 50 MCG/ACT nasal spray PLACE 2 SPRAYS INTO THE NOSE DAILY. 48 g 3  . folic acid (FOLVITE) 1 MG tablet Take 1 mg by mouth daily.    . hydrocortisone (ANUSOL-HC) 25 MG suppository Place 1 suppository (25 mg total) rectally 2 (two) times daily. For 7 days 14 suppository 0  . loratadine (CLARITIN) 10 MG tablet Take 10 mg by mouth daily.    . methotrexate (RHEUMATREX) 2.5 MG tablet Take 15 mg by mouth once a week. Caution:Chemotherapy. Protect from light. ( Monday of each week )    . pantoprazole (PROTONIX) 40 MG tablet Take 40 mg by mouth daily.  2  . polyethylene glycol (MIRALAX / GLYCOLAX) packet Take 17 g by mouth daily.    . traMADol (ULTRAM) 50 MG tablet Take 1 tablet (50 mg total) by mouth every 8 (eight) hours as needed. 15 tablet 0   Current Facility-Administered Medications on File Prior to Visit  Medication Dose Route Frequency Provider Last Rate Last Dose  . lidocaine (PF) (XYLOCAINE) 1 % injection 0.3 mL  0.3 mL  Other Once Magnus Sinning, MD        Review of Systems  Constitutional: Negative for  activity change, appetite change, fatigue, fever and unexpected weight change.  HENT: Negative for congestion, ear pain, rhinorrhea, sinus pressure and sore throat.   Eyes: Negative for pain, redness and visual disturbance.  Respiratory: Negative for cough, shortness of breath and wheezing.   Cardiovascular: Negative for chest pain and palpitations.  Gastrointestinal: Negative for abdominal pain, blood in stool, constipation and diarrhea.  Endocrine: Negative for polydipsia and polyuria.  Genitourinary: Positive for vaginal discharge. Negative for dysuria, flank pain, frequency, genital sores, hematuria, urgency and vaginal bleeding.  Musculoskeletal: Negative for arthralgias, back pain and myalgias.  Skin: Positive for color change. Negative for pallor and rash.       Itching in groin area  Allergic/Immunologic: Negative for environmental allergies.  Neurological: Negative for dizziness, syncope and headaches.  Hematological: Negative for adenopathy. Does not bruise/bleed easily.  Psychiatric/Behavioral: Negative for decreased concentration and dysphoric mood. The patient is not nervous/anxious.        Objective:   Physical Exam  Constitutional: She appears well-developed and well-nourished. No distress.  overwt and well app  HENT:  Head: Normocephalic and atraumatic.  Mouth/Throat: Oropharynx is clear and moist.  Eyes: Pupils are equal, round, and reactive to light. Conjunctivae and EOM are normal. Right eye exhibits no discharge. Left eye exhibits no discharge. No scleral icterus.  Neck: Normal range of motion. Neck supple.  Cardiovascular: Normal rate, regular rhythm and normal heart sounds.  Pulmonary/Chest: Effort normal and breath sounds normal. She has no wheezes.  Abdominal: Soft. Bowel sounds are normal. She exhibits no distension. There is no tenderness.  No suprapubic tenderness or  fullness    Genitourinary: Vaginal discharge found.  Genitourinary Comments: Nl appearing vaginal mucosa with scant pale d/c and no odor Gc/chlamydia swab obtained as well as wet prep  Nl bimanual exam/no tenderness  Hyperpigmentation of skin surrounding vulva and inner thighs w/o rash or excoriation or lesions  Lymphadenopathy:    She has no cervical adenopathy.  Neurological: She is alert. No cranial nerve deficit. Coordination normal.  Skin: Skin is warm and dry. No rash noted.  Psychiatric: Her mood appears anxious.  Mildly anxious Pleasant           Assessment & Plan:   Problem List Items Addressed This Visit      Musculoskeletal and Integument   Vaginal itching - Primary    With some hyphae on wet prep Ext itching tx with terazol cream- 3 d internally and 7 d externally  Also gc/chlamydia prep obtained  Update if not starting to improve in a week or if worsening        Relevant Orders   POCT Wet Prep [facility]) (Completed)   C. trachomatis/N. gonorrhoeae RNA    Other Visit Diagnoses    Need for influenza vaccination       Relevant Orders   Flu Vaccine QUAD 6+ mos PF IM (Fluarix Quad PF) (Completed)

## 2018-07-27 NOTE — Patient Instructions (Addendum)
Use the terconazole cream both internally (3 days) and externally (7-10 days) I supplied a refill  This treats yeast  Also keep skin dry  Avoid friction when able  We will also do screening for STD- gc and chlamydia   Let me know if no improvement   Switch to dove soap

## 2018-07-28 LAB — C. TRACHOMATIS/N. GONORRHOEAE RNA
C. trachomatis RNA, TMA: NOT DETECTED
N. gonorrhoeae RNA, TMA: NOT DETECTED

## 2018-07-28 NOTE — Assessment & Plan Note (Signed)
No clinical changes 

## 2018-07-28 NOTE — Assessment & Plan Note (Signed)
With some hyphae on wet prep Ext itching tx with terazol cream- 3 d internally and 7 d externally  Also gc/chlamydia prep obtained  Update if not starting to improve in a week or if worsening

## 2018-09-13 DIAGNOSIS — R1033 Periumbilical pain: Secondary | ICD-10-CM | POA: Diagnosis not present

## 2018-09-13 DIAGNOSIS — Z79899 Other long term (current) drug therapy: Secondary | ICD-10-CM | POA: Diagnosis not present

## 2018-09-13 DIAGNOSIS — K219 Gastro-esophageal reflux disease without esophagitis: Secondary | ICD-10-CM | POA: Diagnosis not present

## 2018-09-13 DIAGNOSIS — K449 Diaphragmatic hernia without obstruction or gangrene: Secondary | ICD-10-CM | POA: Diagnosis not present

## 2018-09-29 DIAGNOSIS — M0579 Rheumatoid arthritis with rheumatoid factor of multiple sites without organ or systems involvement: Secondary | ICD-10-CM | POA: Diagnosis not present

## 2018-09-29 DIAGNOSIS — Z79899 Other long term (current) drug therapy: Secondary | ICD-10-CM | POA: Diagnosis not present

## 2018-10-24 ENCOUNTER — Telehealth: Payer: Self-pay | Admitting: Family Medicine

## 2018-10-24 DIAGNOSIS — E118 Type 2 diabetes mellitus with unspecified complications: Secondary | ICD-10-CM

## 2018-10-24 DIAGNOSIS — I10 Essential (primary) hypertension: Secondary | ICD-10-CM

## 2018-10-24 NOTE — Telephone Encounter (Signed)
-----   Message from Eustace Pen, LPN sent at [phone]  1:59 PM EST ----- Regarding: Labs 1/7 Lab orders needed. Thank you.  Insurance:  [facility]

## 2018-10-26 ENCOUNTER — Ambulatory Visit: Payer: Medicare Other

## 2018-10-26 ENCOUNTER — Other Ambulatory Visit (INDEPENDENT_AMBULATORY_CARE_PROVIDER_SITE_OTHER): Payer: Medicare Other

## 2018-10-26 DIAGNOSIS — E118 Type 2 diabetes mellitus with unspecified complications: Secondary | ICD-10-CM | POA: Diagnosis not present

## 2018-10-26 DIAGNOSIS — I10 Essential (primary) hypertension: Secondary | ICD-10-CM | POA: Diagnosis not present

## 2018-10-26 LAB — CBC WITH DIFFERENTIAL/PLATELET
BASOS PCT: 0.4 % (ref 0.0–3.0)
Basophils Absolute: 0 10*3/uL (ref 0.0–0.1)
Eosinophils Absolute: 0.1 10*3/uL (ref 0.0–0.7)
Eosinophils Relative: 2.8 % (ref 0.0–5.0)
HCT: 44 % (ref 36.0–46.0)
Hemoglobin: 14.4 g/dL (ref 12.0–15.0)
Lymphocytes Relative: 32 % (ref 12.0–46.0)
Lymphs Abs: 1.7 10*3/uL (ref 0.7–4.0)
MCHC: 32.8 g/dL (ref 30.0–36.0)
MCV: 89.5 fl (ref 78.0–100.0)
Monocytes Absolute: 0.4 10*3/uL (ref 0.1–1.0)
Monocytes Relative: 7.2 % (ref 3.0–12.0)
Neutro Abs: 3.1 10*3/uL (ref 1.4–7.7)
Neutrophils Relative %: 57.6 % (ref 43.0–77.0)
Platelets: 319 10*3/uL (ref 150.0–400.0)
RBC: 4.91 Mil/uL (ref 3.87–5.11)
RDW: 16 % — AB (ref 11.5–15.5)
WBC: 5.4 10*3/uL (ref 4.0–10.5)

## 2018-10-26 LAB — LDL CHOLESTEROL, DIRECT: Direct LDL: 60 mg/dL

## 2018-10-26 LAB — COMPREHENSIVE METABOLIC PANEL
ALT: 8 U/L (ref 0–35)
AST: 17 U/L (ref 0–37)
Albumin: 4.1 g/dL (ref 3.5–5.2)
Alkaline Phosphatase: 70 U/L (ref 39–117)
BUN: 10 mg/dL (ref 6–23)
CHLORIDE: 103 meq/L (ref 96–112)
CO2: 29 mEq/L (ref 19–32)
Calcium: 9.4 mg/dL (ref 8.4–10.5)
Creatinine, Ser: 0.92 mg/dL (ref 0.40–1.20)
GFR: 76.23 mL/min (ref >60.00)
Glucose, Bld: 123 mg/dL — ABNORMAL HIGH (ref 70–99)
POTASSIUM: 4 meq/L (ref 3.5–5.1)
Sodium: 138 mEq/L (ref 135–145)
Total Bilirubin: 0.4 mg/dL (ref 0.2–1.2)
Total Protein: 7.5 g/dL (ref 6.0–8.3)

## 2018-10-26 LAB — LIPID PANEL
Cholesterol: 168 mg/dL (ref 0–200)
HDL: 43.2 mg/dL (ref >39.00)
NonHDL: 124.86
Total CHOL/HDL Ratio: 4
Triglycerides: 221 mg/dL — ABNORMAL HIGH (ref 0.0–149.0)
VLDL: 44.2 mg/dL — ABNORMAL HIGH (ref 0.0–40.0)

## 2018-10-26 LAB — TSH: TSH: 1.15 u[IU]/mL (ref 0.35–4.50)

## 2018-10-26 LAB — HEMOGLOBIN A1C: Hgb A1c MFr Bld: 6.5 % (ref 4.6–6.5)

## 2018-10-27 DIAGNOSIS — M0579 Rheumatoid arthritis with rheumatoid factor of multiple sites without organ or systems involvement: Secondary | ICD-10-CM | POA: Diagnosis not present

## 2018-10-27 DIAGNOSIS — G8929 Other chronic pain: Secondary | ICD-10-CM | POA: Diagnosis not present

## 2018-10-27 DIAGNOSIS — M545 Low back pain: Secondary | ICD-10-CM | POA: Diagnosis not present

## 2018-10-27 DIAGNOSIS — M25561 Pain in right knee: Secondary | ICD-10-CM | POA: Diagnosis not present

## 2018-10-29 ENCOUNTER — Encounter: Payer: Self-pay | Admitting: Family Medicine

## 2018-10-29 ENCOUNTER — Ambulatory Visit (INDEPENDENT_AMBULATORY_CARE_PROVIDER_SITE_OTHER): Payer: Medicare Other | Admitting: Family Medicine

## 2018-10-29 VITALS — BP 135/80 | HR 78 | Temp 98.2°F | Ht 66.75 in | Wt 202.5 lb

## 2018-10-29 DIAGNOSIS — E118 Type 2 diabetes mellitus with unspecified complications: Secondary | ICD-10-CM

## 2018-10-29 DIAGNOSIS — Z0000 Encounter for general adult medical examination without abnormal findings: Secondary | ICD-10-CM | POA: Diagnosis not present

## 2018-10-29 DIAGNOSIS — I10 Essential (primary) hypertension: Secondary | ICD-10-CM | POA: Diagnosis not present

## 2018-10-29 DIAGNOSIS — I700 Atherosclerosis of aorta: Secondary | ICD-10-CM

## 2018-10-29 DIAGNOSIS — M05761 Rheumatoid arthritis with rheumatoid factor of right knee without organ or systems involvement: Secondary | ICD-10-CM

## 2018-10-29 DIAGNOSIS — M05762 Rheumatoid arthritis with rheumatoid factor of left knee without organ or systems involvement: Secondary | ICD-10-CM

## 2018-10-29 LAB — MICROALBUMIN / CREATININE URINE RATIO
Creatinine,U: 209.8 mg/dL
Microalb Creat Ratio: 0.5 mg/g (ref 0.0–30.0)
Microalb, Ur: 0.9 mg/dL (ref 0.0–1.9)

## 2018-10-29 MED ORDER — FLUTICASONE PROPIONATE 50 MCG/ACT NA SUSP: NASAL | 3 refills | Status: DC

## 2018-10-29 MED ORDER — AMLODIPINE BESYLATE 5 MG PO TABS: 5.0000 mg | ORAL_TABLET | Freq: Every day | ORAL | 3 refills | Status: DC

## 2018-10-29 MED ORDER — ALBUTEROL SULFATE HFA 108 (90 BASE) MCG/ACT IN AERS: 2.0000 | INHALATION_SPRAY | RESPIRATORY_TRACT | 3 refills | Status: AC | PRN

## 2018-10-29 NOTE — Progress Notes (Signed)
Subjective:    Patient ID: [name], female    DOB: [birth year], 78 y.o.   MRN: [id number]  HPI  Here for health maintenance exam and to review chronic medical problems   Also amw   Wt Readings from Last 3 Encounters:  10/29/18 202 lb 8 oz (91.9 kg)  07/27/18 199 lb (90.3 kg)  03/10/18 194 lb (88 kg)  wt is up  She exercises - 10 minutes of walking in the house daily- ready to inc it  Not eating as well but binges occ (portion size too big) 31.95 kg/m   I have personally reviewed the Medicare Annual Wellness questionnaire and have noted 1. The patient's medical and social history 2. Their use of alcohol, tobacco or illicit drugs 3. Their current medications and supplements 4. The patient's functional ability including ADL's, fall risks, home safety risks and hearing or visual             impairment. 5. Diet and physical activities 6. Evidence for depression or mood disorders  The patients weight, height, BMI have been recorded in the chart and visual acuity is per eye clinic.  I have made referrals, counseling and provided education to the patient based review of the above and I have provided the pt with a written personalized care plan for preventive services. Reviewed and updated provider list, see scanned forms.  See scanned forms.  Routine anticipatory guidance given to patient.  See health maintenance. Colon cancer screening  Colonoscopy 6/17 recall 5 y due to fam hx  Sister had colon cancer  Breast cancer screening mammogram 1/19-she wants to schedule that herself  Self breast exam-no lumps or changes  Flu vaccine 10/19 Tetanus vaccine - 11/07 (medicare does not cover)  Pneumovax-UTD Zoster vaccine- has not had /will look into it  dexa 12/17 normal BMD  Advance directive- does not have one  Cognitive function addressed- see scanned forms- and if abnormal then additional documentation follows. (occ walks into a room and forgets why) - otherwise doing very well     PMH and SH reviewed  Meds, vitals, and allergies reviewed.   ROS: See HPI.  Otherwise negative.     Hearing Screening   125Hz  250Hz  500Hz  1000Hz  2000Hz  3000Hz  4000Hz  6000Hz  8000Hz   Right ear:   0 0 40  0    Left ear:   40 40 40  40    Vision Screening Comments: Pt had eye exam with Dr. Gloriann Loan in 2019 overdue for eye exam   R ear missed frequencies She does not notice it at all   Cog screen is normal-see paper copy   PHQ score  No problems with mood right now   bp is stable today  No cp or palpitations or headaches or edema  No side effects to medicines  BP Readings from Last 3 Encounters:  10/29/18 (!) 144/68  07/27/18 136/76  03/10/18 140/84  re check BP: 135/80  Improved after sitting     DM2 Lab Results  Component Value Date   HGBA1C 6.5 10/26/2018   This is good- last was 6.7 She watches diet most of the time (familiar with it from her family)  Well controlled with diet  Cannot take ace  Needs micralb today  Eye exam- may or June   Lab Results  Component Value Date   WBC 5.4 10/26/2018   HGB 14.4 10/26/2018   HCT 44.0 10/26/2018   MCV 89.5 10/26/2018   PLT 319.0 10/26/2018  Lab Results  Component Value Date   CREATININE 0.92 10/26/2018   BUN 10 10/26/2018   NA 138 10/26/2018   K 4.0 10/26/2018   CL 103 10/26/2018   CO2 29 10/26/2018   Lab Results  Component Value Date   ALT 8 10/26/2018   AST 17 10/26/2018   ALKPHOS 70 10/26/2018   BILITOT 0.4 10/26/2018    Lab Results  Component Value Date   TSH 1.15 10/26/2018    Cholesterol  Lab Results  Component Value Date   CHOL 168 10/26/2018   CHOL 117 10/23/2017   CHOL 160 10/07/2016   Lab Results  Component Value Date   HDL 43.20 10/26/2018   HDL 35.00 (L) 10/23/2017   HDL 44.60 10/07/2016   Lab Results  Component Value Date   LDLCALC 50 10/23/2017   LDLCALC 78 10/07/2016   LDLCALC 78 12/14/2015   Lab Results  Component Value Date   TRIG 221.0 (H) 10/26/2018   TRIG 160.0  (H) 10/23/2017   TRIG 188.0 (H) 10/07/2016   Lab Results  Component Value Date   CHOLHDL 4 10/26/2018   CHOLHDL 3 10/23/2017   CHOLHDL 4 10/07/2016   Lab Results  Component Value Date   LDLDIRECT 60.0 10/26/2018   LDL remains very low at 50 w/o medication    Patient Active Problem List   Diagnosis Date Noted  . Vaginal itching 07/27/2018  . Encounter for screening mammogram for breast cancer 10/27/2017  . Routine general medical examination at a health care facility 10/27/2017  . Abdominal pain 03/31/2017  . Aortic atherosclerosis ([place]) 02/03/2017  . RA (rheumatoid arthritis) ([place])   . Diabetes mellitus with complication ([place])   . Adjustment disorder with mixed anxiety and depressed mood 06/24/2016  . Essential hypertension 01/07/2016  . Coronary atherosclerosis 11/07/2015  . GERD 07/09/2007   Past Medical History:  Diagnosis Date  . Allergic rhinitis   . Anxiety   . Cataract 2015   bilateral  . Diabetes mellitus with complication ([place])   . Diverticulosis   . Esophageal reflux   . Essential hypertension 01/07/2016  . Fatty liver 2018  . Fibula fracture 02/2005  . Former smoker   . Gastritis 2003  . Heart murmur   . Hemorrhoids   . History of nephrolithiasis   . Hyperglycemia   . Hyperlipemia   . Osteoarthritis   . Personal history of colonic polyps 1998   hyperplastic  . RA (rheumatoid arthritis) ([place])   . Shingles 2012  . Uterine fibroid    Past Surgical History:  Procedure Laterality Date  . CHOLECYSTECTOMY    . COLONOSCOPY  11/08   internal hemorrhoids  . EP procedure  1994   SVT; normal echo  . ESOPHAGOGASTRODUODENOSCOPY  11/03   reactive gastropathy  . LIPOMA EXCISION  11/2002  . SVT s/p surgery--? ablation    . UPPER GASTROINTESTINAL ENDOSCOPY    . vaginal polyp excised  12/2000   benign   Social History   Tobacco Use  . Smoking status: Former Smoker    Last attempt to quit: 10/21/2003    Years since quitting: 15.0  . Smokeless tobacco:  Never Used  Substance Use Topics  . Alcohol use: No    Alcohol/week: 0.0 standard drinks  . Drug use: No   Family History  Problem Relation Age of Onset  . Colon cancer Father 53  . Diabetes Mother   . Heart disease Mother   . Kidney disease Mother  ESRD  . Stroke Mother   . Hypertension Brother   . Breast cancer Sister   . Diabetes Other        nephews  . Colon cancer Sister   . Esophageal cancer Neg Hx   . Stomach cancer Neg Hx   . Rectal cancer Neg Hx    Allergies  Allergen Reactions  . Bee Venom Anaphylaxis  . Other Anaphylaxis  . Hctz [Hydrochlorothiazide] Other (See Comments)    Felt weak all over  Felt weak all over   . Zyrtec Allergy [Cetirizine Hcl]     Doesn't help   Current Outpatient Medications on File Prior to Visit  Medication Sig Dispense Refill  . acetaminophen (TYLENOL) 500 MG tablet Take 500 mg by mouth every 6 (six) hours as needed.    . Calcium & Magnesium Carbonates (MYLANTA PO) Take 1 capsule by mouth as directed.      . cyclobenzaprine (FLEXERIL) 5 MG tablet     . dicyclomine (BENTYL) 20 MG tablet Take 1 tablet (20 mg total) by mouth 4 (four) times daily -  before meals and at bedtime. 120 tablet 5  . docusate sodium (COLACE) 100 MG capsule Take 1 capsule (100 mg total) by mouth 2 (two) times daily. 60 capsule 0  . EPINEPHrine (EPIPEN IJ) Inject as directed as needed. Reported on 12/05/2015    . feeding supplement (BOOST / RESOURCE BREEZE) LIQD Take 1 Container by mouth 3 (three) times daily between meals. 90 Container 0  . folic acid (FOLVITE) 1 MG tablet Take 1 mg by mouth daily.    . hydrocortisone (ANUSOL-HC) 25 MG suppository Place 1 suppository (25 mg total) rectally 2 (two) times daily. For 7 days 14 suppository 0  . loratadine (CLARITIN) 10 MG tablet Take 10 mg by mouth daily.    . methotrexate (RHEUMATREX) 2.5 MG tablet Take 15 mg by mouth once a week. Caution:Chemotherapy. Protect from light. ( Monday of each week )    .  pantoprazole (PROTONIX) 40 MG tablet Take 40 mg by mouth daily.  2  . polyethylene glycol (MIRALAX / GLYCOLAX) packet Take 17 g by mouth daily.    Marland Kitchen terconazole (TERAZOL 3) 0.8 % vaginal cream Place 1 applicator vaginally at bedtime. 20 g 0  . traMADol (ULTRAM) 50 MG tablet Take 1 tablet (50 mg total) by mouth every 8 (eight) hours as needed. 15 tablet 0   Current Facility-Administered Medications on File Prior to Visit  Medication Dose Route Frequency Provider Last Rate Last Dose  . lidocaine (PF) (XYLOCAINE) 1 % injection 0.3 mL  0.3 mL Other Once Magnus Sinning, MD         Review of Systems  Constitutional: Negative for activity change, appetite change, fatigue, fever and unexpected weight change.  HENT: Negative for congestion, ear pain, rhinorrhea, sinus pressure and sore throat.   Eyes: Negative for pain, redness and visual disturbance.  Respiratory: Negative for cough, shortness of breath and wheezing.   Cardiovascular: Negative for chest pain and palpitations.  Gastrointestinal: Negative for abdominal pain, blood in stool, constipation and diarrhea.  Endocrine: Negative for polydipsia and polyuria.  Genitourinary: Negative for dysuria, frequency and urgency.  Musculoskeletal: Positive for arthralgias and back pain. Negative for myalgias.  Skin: Negative for pallor and rash.  Allergic/Immunologic: Negative for environmental allergies.  Neurological: Negative for dizziness, syncope and headaches.  Hematological: Negative for adenopathy. Does not bruise/bleed easily.  Psychiatric/Behavioral: Negative for decreased concentration and dysphoric mood. The patient is not nervous/anxious.  Objective:   Physical Exam Constitutional:      General: She is not in acute distress.    Appearance: She is well-developed. She is obese. She is not ill-appearing.  HENT:     Head: Normocephalic and atraumatic.     Right Ear: Tympanic membrane, ear canal and external ear normal.     Left  Ear: Tympanic membrane, ear canal and external ear normal.     Nose: No congestion or rhinorrhea.     Mouth/Throat:     Mouth: Mucous membranes are moist.     Pharynx: Oropharynx is clear. No posterior oropharyngeal erythema.  Eyes:     General: No scleral icterus.    Conjunctiva/sclera: Conjunctivae normal.     Pupils: Pupils are equal, round, and reactive to light.  Neck:     Musculoskeletal: Normal range of motion and neck supple.     Thyroid: No thyromegaly.     Vascular: No carotid bruit or JVD.  Cardiovascular:     Rate and Rhythm: Normal rate and regular rhythm.     Pulses: Normal pulses.     Heart sounds: Normal heart sounds. No gallop.   Pulmonary:     Effort: Pulmonary effort is normal. No respiratory distress.     Breath sounds: Normal breath sounds. No wheezing.  Chest:     Chest wall: No tenderness.  Abdominal:     General: Bowel sounds are normal. There is no distension or abdominal bruit.     Palpations: Abdomen is soft. There is no mass.     Tenderness: There is no abdominal tenderness.  Genitourinary:    Comments: Breast exam: No mass, nodules, thickening, tenderness, bulging, retraction, inflamation, nipple discharge or skin changes noted.  No axillary or clavicular LA.     Musculoskeletal: Normal range of motion.        General: No tenderness.     Right lower leg: No edema.     Left lower leg: No edema.  Lymphadenopathy:     Cervical: No cervical adenopathy.  Skin:    General: Skin is warm and dry.     Capillary Refill: Capillary refill takes less than 2 seconds.     Coloration: Skin is not pale.     Findings: No erythema or rash.  Neurological:     General: No focal deficit present.     Mental Status: She is alert.     Cranial Nerves: No cranial nerve deficit.     Motor: No abnormal muscle tone.     Coordination: Coordination normal.     Gait: Gait normal.     Deep Tendon Reflexes: Reflexes are normal and symmetric. Reflexes normal.  Psychiatric:         Mood and Affect: Mood normal.           Assessment & Plan:   Problem List Items Addressed This Visit      Cardiovascular and Mediastinum   Essential hypertension    bp in fair control at this time  BP Readings from Last 1 Encounters:  10/29/18 135/80   No changes needed Most recent labs reviewed  Disc lifstyle change with low sodium diet and exercise        Relevant Medications   amLODipine (NORVASC) 5 MG tablet   Aortic atherosclerosis ([facility])    BP and lipids and glucose are well controlled  Continue to monitor       Relevant Medications   amLODipine (NORVASC) 5 MG tablet  Endocrine   Diabetes mellitus with complication ([place])    Lab Results  Component Value Date   HGBA1C 6.5 10/26/2018   Well controlled  Disc diet/exercise foot and eye care       Relevant Orders   Microalbumin / creatinine urine ratio (Completed)     Musculoskeletal and Integument   RA (rheumatoid arthritis) ([place])    Seeing rheumatology  Stable with methotrexate        Other   Routine general medical examination at a health care facility - Primary    Reviewed health habits including diet and exercise and skin cancer prevention Reviewed appropriate screening tests for age  Also reviewed health mt list, fam hx and immunization status , as well as social and family history   See HPI Labs rev  Adv directive rev and booklet given to pt to work on  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

## 2018-10-29 NOTE — Patient Instructions (Addendum)
Increase walking to 15 minutes a day In 2 weeks go up to 20 minutes   Do not forget to schedule your mammogram - call if you need a referral   If you are interested in the new shingles vaccine (Shingrix) - call your local pharmacy to check on coverage and availability  If affordable, get on a wait list at your pharmacy to get the vaccine.  Please work on your advance directive - complete the blue booklet and then get it notarized (for instance at the bank)  Then bring a copy and we will scan it   For diabetes Try to get most of your carbohydrates from produce (with the exception of white potatoes)  Eat less bread/pasta/rice/snack foods/cereals/sweets and other items from the middle of the grocery store (processed carbs)  This will help you loose weight   For cholesterol Avoid red meat/ fried foods/ egg yolks/ fatty breakfast meats/ butter, cheese and high fat dairy/ and shellfish

## 2018-10-30 NOTE — Assessment & Plan Note (Signed)
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

## 2018-10-30 NOTE — Assessment & Plan Note (Signed)
Seeing rheumatology  Stable with methotrexate

## 2018-10-30 NOTE — Assessment & Plan Note (Signed)
Lab Results  Component Value Date   HGBA1C 6.5 10/26/2018   Well controlled  Disc diet/exercise foot and eye care

## 2018-10-30 NOTE — Assessment & Plan Note (Signed)
bp in fair control at this time  BP Readings from Last 1 Encounters:  10/29/18 135/80   No changes needed Most recent labs reviewed  Disc lifstyle change with low sodium diet and exercise

## 2018-10-30 NOTE — Assessment & Plan Note (Signed)
BP and lipids and glucose are well controlled  Continue to monitor

## 2018-11-01 ENCOUNTER — Encounter: Payer: Self-pay | Admitting: *Deleted

## 2018-12-21 ENCOUNTER — Encounter: Payer: Self-pay | Admitting: Family Medicine

## 2018-12-21 ENCOUNTER — Ambulatory Visit (INDEPENDENT_AMBULATORY_CARE_PROVIDER_SITE_OTHER): Payer: Medicare Other | Admitting: Family Medicine

## 2018-12-21 VITALS — BP 130/78 | HR 71 | Temp 98.2°F | Ht 66.75 in | Wt 204.4 lb

## 2018-12-21 DIAGNOSIS — J0110 Acute frontal sinusitis, unspecified: Secondary | ICD-10-CM

## 2018-12-21 DIAGNOSIS — J019 Acute sinusitis, unspecified: Secondary | ICD-10-CM | POA: Insufficient documentation

## 2018-12-21 MED ORDER — BENZONATATE 200 MG PO CAPS: 200.0000 mg | ORAL_CAPSULE | Freq: Three times a day (TID) | ORAL | 1 refills | Status: DC | PRN

## 2018-12-21 MED ORDER — AMOXICILLIN-POT CLAVULANATE 875-125 MG PO TABS: 1.0000 | ORAL_TABLET | Freq: Two times a day (BID) | ORAL | 0 refills | Status: DC

## 2018-12-21 NOTE — Patient Instructions (Addendum)
I think you have a sinus infection  Drink fluids Rest when you can  Continue flonase for sinus inflammation  Continue delsym for cough  You can add plain mucinex to help loosen congestion in your head   Try tessalon for cough also  Take augmentin for the infection   Update if not starting to improve in a week or if worsening

## 2018-12-21 NOTE — Assessment & Plan Note (Signed)
S/p uri -2 mo  Sinus tenderness worse on R  Cover with augmentin  Fluids/rest flonase Delsym and tessalon for cough  Expectorant prn congestion Update if not starting to improve in a week or if worsening

## 2018-12-21 NOTE — Progress Notes (Signed)
Subjective:    Patient ID: [name], female    DOB: 18-Jun-1941, 78 y.o.   MRN: [id number]  HPI  Here with fatigue/cough/congestion   Sluggish for a while  Cough and congestion on and off for 2 months Cough- dry cough  Wheezing on and off  Nasal congestion-bad at times - nasal d/c is clear  Some pain in R side of her face  Rattling in her chest  Throat is dry   ? Fever- doubts it  Sunday felt more cold than usual   Exp to multiple people with uri   Takes biologic med for RA   Otc:  Tylenol  Cough medicine - delsym -? If it helps   Patient Active Problem List   Diagnosis Date Noted  . Acute sinusitis 12/21/2018  . Encounter for screening mammogram for breast cancer 10/27/2017  . Routine general medical examination at a health care facility 10/27/2017  . Abdominal pain 03/31/2017  . Aortic atherosclerosis ([place]) 02/03/2017  . RA (rheumatoid arthritis) ([place])   . Diabetes mellitus with complication ([place])   . Adjustment disorder with mixed anxiety and depressed mood 06/24/2016  . Essential hypertension 01/07/2016  . Coronary atherosclerosis 11/07/2015  . GERD 07/09/2007   Past Medical History:  Diagnosis Date  . Allergic rhinitis   . Anxiety   . Cataract 2015   bilateral  . Diabetes mellitus with complication ([place])   . Diverticulosis   . Esophageal reflux   . Essential hypertension 01/07/2016  . Fatty liver 2018  . Fibula fracture 02/2005  . Former smoker   . Gastritis 2003  . Heart murmur   . Hemorrhoids   . History of nephrolithiasis   . Hyperglycemia   . Hyperlipemia   . Osteoarthritis   . Personal history of colonic polyps 1998   hyperplastic  . RA (rheumatoid arthritis) ([place])   . Shingles 2012  . Uterine fibroid    Past Surgical History:  Procedure Laterality Date  . CHOLECYSTECTOMY    . COLONOSCOPY  11/08   internal hemorrhoids  . EP procedure  1994   SVT; normal echo  . ESOPHAGOGASTRODUODENOSCOPY  11/03   reactive gastropathy  .  LIPOMA EXCISION  11/2002  . SVT s/p surgery--? ablation    . UPPER GASTROINTESTINAL ENDOSCOPY    . vaginal polyp excised  12/2000   benign   Social History   Tobacco Use  . Smoking status: Former Smoker    Last attempt to quit: 10/21/2003    Years since quitting: 15.1  . Smokeless tobacco: Never Used  Substance Use Topics  . Alcohol use: No    Alcohol/week: 0.0 standard drinks  . Drug use: No   Family History  Problem Relation Age of Onset  . Colon cancer Father 91  . Diabetes Mother   . Heart disease Mother   . Kidney disease Mother        ESRD  . Stroke Mother   . Hypertension Brother   . Breast cancer Sister   . Diabetes Other        nephews  . Colon cancer Sister   . Esophageal cancer Neg Hx   . Stomach cancer Neg Hx   . Rectal cancer Neg Hx    Allergies  Allergen Reactions  . Bee Venom Anaphylaxis  . Other Anaphylaxis  . Hctz [Hydrochlorothiazide] Other (See Comments)    Felt weak all over  Felt weak all over   . Zyrtec Allergy [Cetirizine Hcl]  Doesn't help   Current Outpatient Medications on File Prior to Visit  Medication Sig Dispense Refill  . acetaminophen (TYLENOL) 500 MG tablet Take 500 mg by mouth every 6 (six) hours as needed.    Marland Kitchen albuterol (PROVENTIL HFA;VENTOLIN HFA) 108 (90 Base) MCG/ACT inhaler Inhale 2 puffs into the lungs every 4 (four) hours as needed for wheezing or shortness of breath. Please give spacer if possible 1 Inhaler 3  . amLODipine (NORVASC) 5 MG tablet Take 1 tablet (5 mg total) by mouth daily. 90 tablet 3  . Calcium & Magnesium Carbonates (MYLANTA PO) Take 1 capsule by mouth as directed.      . cyclobenzaprine (FLEXERIL) 5 MG tablet     . dicyclomine (BENTYL) 20 MG tablet Take 1 tablet (20 mg total) by mouth 4 (four) times daily -  before meals and at bedtime. 120 tablet 5  . docusate sodium (COLACE) 100 MG capsule Take 1 capsule (100 mg total) by mouth 2 (two) times daily. 60 capsule 0  . EPINEPHrine (EPIPEN IJ) Inject as  directed as needed. Reported on 12/05/2015    . feeding supplement (BOOST / RESOURCE BREEZE) LIQD Take 1 Container by mouth 3 (three) times daily between meals. 90 Container 0  . fluticasone (FLONASE) 50 MCG/ACT nasal spray PLACE 2 SPRAYS INTO THE NOSE DAILY. 48 g 3  . folic acid (FOLVITE) 1 MG tablet Take 1 mg by mouth daily.    . hydrocortisone (ANUSOL-HC) 25 MG suppository Place 1 suppository (25 mg total) rectally 2 (two) times daily. For 7 days 14 suppository 0  . loratadine (CLARITIN) 10 MG tablet Take 10 mg by mouth daily.    . methotrexate (RHEUMATREX) 2.5 MG tablet Take 15 mg by mouth once a week. Caution:Chemotherapy. Protect from light. ( Monday of each week )    . pantoprazole (PROTONIX) 40 MG tablet Take 40 mg by mouth daily.  2  . polyethylene glycol (MIRALAX / GLYCOLAX) packet Take 17 g by mouth daily.    Marland Kitchen terconazole (TERAZOL 3) 0.8 % vaginal cream Place 1 applicator vaginally at bedtime. 20 g 0  . traMADol (ULTRAM) 50 MG tablet Take 1 tablet (50 mg total) by mouth every 8 (eight) hours as needed. 15 tablet 0   Current Facility-Administered Medications on File Prior to Visit  Medication Dose Route Frequency Provider Last Rate Last Dose  . lidocaine (PF) (XYLOCAINE) 1 % injection 0.3 mL  0.3 mL Other Once Magnus Sinning, MD         Review of Systems  Constitutional: Positive for appetite change and fatigue. Negative for fever.  HENT: Positive for congestion, ear pain, postnasal drip, rhinorrhea, sinus pressure and sore throat. Negative for nosebleeds and voice change.   Eyes: Negative for pain, redness and itching.  Respiratory: Positive for cough and wheezing. Negative for chest tightness and shortness of breath.   Cardiovascular: Negative for chest pain.  Gastrointestinal: Negative for abdominal pain, diarrhea, nausea and vomiting.  Endocrine: Negative for polyuria.  Genitourinary: Negative for dysuria, frequency and urgency.  Musculoskeletal: Negative for arthralgias and  myalgias.  Allergic/Immunologic: Negative for immunocompromised state.  Neurological: Positive for headaches. Negative for dizziness, tremors, syncope, weakness and numbness.  Hematological: Negative for adenopathy. Does not bruise/bleed easily.  Psychiatric/Behavioral: Negative for dysphoric mood. The patient is not nervous/anxious.        Objective:   Physical Exam Constitutional:      General: She is not in acute distress.    Appearance: Normal appearance. She  is well-developed. She is obese. She is not ill-appearing.  HENT:     Head: Normocephalic and atraumatic.     Comments: Bilateral maxillary and frontal sinus tenderness    Right Ear: Tympanic membrane, ear canal and external ear normal.     Left Ear: Tympanic membrane, ear canal and external ear normal.     Ears:     Comments: TMs are dull but clear     Nose: Congestion and rhinorrhea present.     Mouth/Throat:     Pharynx: Oropharynx is clear. No oropharyngeal exudate or posterior oropharyngeal erythema.     Comments: Clear pnd Eyes:     General: No scleral icterus.       Right eye: No discharge.        Left eye: No discharge.     Conjunctiva/sclera: Conjunctivae normal.     Pupils: Pupils are equal, round, and reactive to light.  Neck:     Musculoskeletal: Normal range of motion and neck supple.  Cardiovascular:     Rate and Rhythm: Normal rate and regular rhythm.     Heart sounds: Normal heart sounds.  Pulmonary:     Effort: Pulmonary effort is normal. No respiratory distress.     Breath sounds: Normal breath sounds. No wheezing or rales.     Comments: Good air exch No rales or rhonchi No wheeze even on forced expiration   Lymphadenopathy:     Cervical: No cervical adenopathy.  Skin:    General: Skin is warm and dry.     Findings: No rash.  Neurological:     Mental Status: She is alert.     Cranial Nerves: No cranial nerve deficit.     Coordination: Coordination normal.  Psychiatric:        Mood and  Affect: Mood normal.           Assessment & Plan:   Problem List Items Addressed This Visit      Respiratory   Acute sinusitis - Primary    S/p uri -2 mo  Sinus tenderness worse on R  Cover with augmentin  Fluids/rest flonase Delsym and tessalon for cough  Expectorant prn congestion Update if not starting to improve in a week or if worsening        Relevant Medications   amoxicillin-clavulanate (AUGMENTIN) 875-125 MG tablet   benzonatate (TESSALON) 200 MG capsule

## 2019-01-25 DIAGNOSIS — Z79899 Other long term (current) drug therapy: Secondary | ICD-10-CM | POA: Diagnosis not present

## 2019-01-25 DIAGNOSIS — M0579 Rheumatoid arthritis with rheumatoid factor of multiple sites without organ or systems involvement: Secondary | ICD-10-CM | POA: Diagnosis not present

## 2019-01-25 DIAGNOSIS — M25562 Pain in left knee: Secondary | ICD-10-CM | POA: Diagnosis not present

## 2019-01-25 DIAGNOSIS — G8929 Other chronic pain: Secondary | ICD-10-CM | POA: Diagnosis not present

## 2019-01-25 DIAGNOSIS — M25561 Pain in right knee: Secondary | ICD-10-CM | POA: Diagnosis not present

## 2019-03-15 DIAGNOSIS — Z79899 Other long term (current) drug therapy: Secondary | ICD-10-CM | POA: Diagnosis not present

## 2019-03-15 DIAGNOSIS — M0579 Rheumatoid arthritis with rheumatoid factor of multiple sites without organ or systems involvement: Secondary | ICD-10-CM | POA: Diagnosis not present

## 2019-04-26 ENCOUNTER — Telehealth: Payer: Self-pay | Admitting: Family Medicine

## 2019-04-26 DIAGNOSIS — M0579 Rheumatoid arthritis with rheumatoid factor of multiple sites without organ or systems involvement: Secondary | ICD-10-CM | POA: Diagnosis not present

## 2019-04-26 DIAGNOSIS — Z0000 Encounter for general adult medical examination without abnormal findings: Secondary | ICD-10-CM

## 2019-04-26 DIAGNOSIS — E118 Type 2 diabetes mellitus with unspecified complications: Secondary | ICD-10-CM

## 2019-04-26 DIAGNOSIS — Z79899 Other long term (current) drug therapy: Secondary | ICD-10-CM | POA: Diagnosis not present

## 2019-04-26 DIAGNOSIS — I10 Essential (primary) hypertension: Secondary | ICD-10-CM

## 2019-04-26 NOTE — Telephone Encounter (Signed)
-----   Message from Ellamae Sia sent at 04/25/2019 12:49 PM EDT ----- Regarding: Lab orders for Thursday, 7.9.20 Patient is scheduled for CPX labs, please order future labs, Thanks , Karna Christmas

## 2019-04-28 ENCOUNTER — Other Ambulatory Visit (INDEPENDENT_AMBULATORY_CARE_PROVIDER_SITE_OTHER): Payer: Medicare Other

## 2019-04-28 ENCOUNTER — Other Ambulatory Visit: Payer: Medicare Other

## 2019-04-28 DIAGNOSIS — E118 Type 2 diabetes mellitus with unspecified complications: Secondary | ICD-10-CM

## 2019-04-28 DIAGNOSIS — I10 Essential (primary) hypertension: Secondary | ICD-10-CM | POA: Diagnosis not present

## 2019-04-29 ENCOUNTER — Encounter: Payer: Self-pay | Admitting: Family Medicine

## 2019-04-29 ENCOUNTER — Other Ambulatory Visit: Payer: Self-pay

## 2019-04-29 ENCOUNTER — Ambulatory Visit (INDEPENDENT_AMBULATORY_CARE_PROVIDER_SITE_OTHER): Payer: Medicare Other | Admitting: Family Medicine

## 2019-04-29 ENCOUNTER — Ambulatory Visit: Payer: Medicare Other | Admitting: Primary Care

## 2019-04-29 VITALS — BP 126/64 | HR 85 | Temp 98.5°F | Ht 66.75 in | Wt 199.0 lb

## 2019-04-29 DIAGNOSIS — M069 Rheumatoid arthritis, unspecified: Secondary | ICD-10-CM

## 2019-04-29 DIAGNOSIS — S0300XA Dislocation of jaw, unspecified side, initial encounter: Secondary | ICD-10-CM | POA: Insufficient documentation

## 2019-04-29 LAB — CBC WITH DIFFERENTIAL/PLATELET
Basophils Absolute: 0.1 10*3/uL (ref 0.0–0.1)
Basophils Relative: 1.6 % (ref 0.0–3.0)
Eosinophils Absolute: 0 10*3/uL (ref 0.0–0.7)
Eosinophils Relative: 0.6 % (ref 0.0–5.0)
HCT: 41.8 % (ref 36.0–46.0)
Hemoglobin: 13.9 g/dL (ref 12.0–15.0)
Lymphocytes Relative: 17.8 % (ref 12.0–46.0)
Lymphs Abs: 1.2 10*3/uL (ref 0.7–4.0)
MCHC: 33.2 g/dL (ref 30.0–36.0)
MCV: 88.8 fl (ref 78.0–100.0)
Monocytes Absolute: 0.2 10*3/uL (ref 0.1–1.0)
Monocytes Relative: 3.1 % (ref 3.0–12.0)
Neutro Abs: 5 10*3/uL (ref 1.4–7.7)
Neutrophils Relative %: 76.9 % (ref 43.0–77.0)
Platelets: 304 10*3/uL (ref 150.0–400.0)
RBC: 4.71 Mil/uL (ref 3.87–5.11)
RDW: 16.3 % — ABNORMAL HIGH (ref 11.5–15.5)
WBC: 6.5 10*3/uL (ref 4.0–10.5)

## 2019-04-29 LAB — COMPREHENSIVE METABOLIC PANEL
ALT: 12 U/L (ref 0–35)
AST: 21 U/L (ref 0–37)
Albumin: 4.2 g/dL (ref 3.5–5.2)
Alkaline Phosphatase: 72 U/L (ref 39–117)
BUN: 6 mg/dL (ref 6–23)
CO2: 25 mEq/L (ref 19–32)
Calcium: 8.9 mg/dL (ref 8.4–10.5)
Chloride: 102 mEq/L (ref 96–112)
Creatinine, Ser: 0.92 mg/dL (ref 0.40–1.20)
GFR: 71.63 mL/min (ref >60.00)
Glucose, Bld: 142 mg/dL — ABNORMAL HIGH (ref 70–99)
Potassium: 4.1 mEq/L (ref 3.5–5.1)
Sodium: 137 mEq/L (ref 135–145)
Total Bilirubin: 0.5 mg/dL (ref 0.2–1.2)
Total Protein: 7.1 g/dL (ref 6.0–8.3)

## 2019-04-29 LAB — LIPID PANEL
Cholesterol: 166 mg/dL (ref 0–200)
HDL: 45.7 mg/dL (ref >39.00)
LDL Cholesterol: 82 mg/dL (ref 0–99)
NonHDL: 120.56
Total CHOL/HDL Ratio: 4
Triglycerides: 191 mg/dL — ABNORMAL HIGH (ref 0.0–149.0)
VLDL: 38.2 mg/dL (ref 0.0–40.0)

## 2019-04-29 LAB — HEMOGLOBIN A1C: Hgb A1c MFr Bld: 6.8 % — ABNORMAL HIGH (ref 4.6–6.5)

## 2019-04-29 LAB — TSH: TSH: 0.96 u[IU]/mL (ref 0.35–4.50)

## 2019-04-29 MED ORDER — MELOXICAM 15 MG PO TABS: 15.0000 mg | ORAL_TABLET | Freq: Every day | ORAL | 0 refills | Status: DC | PRN

## 2019-04-29 NOTE — Patient Instructions (Signed)
Avoid chewy foods and gum for a month I think you have jaw joint pain that is referring to the ear  Also sip from a straw  Try the meloxicam for 2-4 weeks as needed (short term)  Also for short term this meloxicam will help knee pain    Go to the rheumatology doctor as planned  meloxicam is short term

## 2019-04-29 NOTE — Progress Notes (Signed)
Subjective:    Patient ID: [name], female    DOB: March 31, 1941, 78 y.o.   MRN: [id number]  HPI Here for knee pain worse in the L And L ear pain   Ear pain for a week  Hurts to have air blowing in it  Some pain in jaw with chewing  Comes and goes  She used to use an oil in her ear -helped in the past  L nostril tends to run more than the R  No sinus pain today   No fever or cough    Has RA On mtx 15 mg weekly Tramadol in the past    Has rheumatology appt on 7/17  Her methotrexate is not working  She was mailed patient assistance forms for Humira soon   Shots in her knees do not help a bit   Asper creme does not help  Topical px from rheum- did not help  Muscle relaxer does not help /also sedating   She cannot take nsaid due to "her heart"  BP Readings from Last 3 Encounters:  04/29/19 126/64  12/21/18 130/78  10/29/18 135/80   Patient Active Problem List   Diagnosis Date Noted  . TMJ (dislocation of temporomandibular joint) 04/29/2019  . Encounter for screening mammogram for breast cancer 10/27/2017  . Routine general medical examination at a health care facility 10/27/2017  . Abdominal pain 03/31/2017  . Aortic atherosclerosis ([place]) 02/03/2017  . RA (rheumatoid arthritis) ([place])   . Diabetes mellitus with complication ([place])   . Adjustment disorder with mixed anxiety and depressed mood 06/24/2016  . Essential hypertension 01/07/2016  . Coronary atherosclerosis 11/07/2015  . GERD 07/09/2007   Past Medical History:  Diagnosis Date  . Allergic rhinitis   . Anxiety   . Cataract 2015   bilateral  . Diabetes mellitus with complication ([place])   . Diverticulosis   . Esophageal reflux   . Essential hypertension 01/07/2016  . Fatty liver 2018  . Fibula fracture 02/2005  . Former smoker   . Gastritis 2003  . Heart murmur   . Hemorrhoids   . History of nephrolithiasis   . Hyperglycemia   . Hyperlipemia   . Osteoarthritis   . Personal history of  colonic polyps 1998   hyperplastic  . RA (rheumatoid arthritis) ([place])   . Shingles 2012  . Uterine fibroid    Past Surgical History:  Procedure Laterality Date  . CHOLECYSTECTOMY    . COLONOSCOPY  11/08   internal hemorrhoids  . EP procedure  1994   SVT; normal echo  . ESOPHAGOGASTRODUODENOSCOPY  11/03   reactive gastropathy  . LIPOMA EXCISION  11/2002  . SVT s/p surgery--? ablation    . UPPER GASTROINTESTINAL ENDOSCOPY    . vaginal polyp excised  12/2000   benign   Social History   Tobacco Use  . Smoking status: Former Smoker    Quit date: 10/21/2003    Years since quitting: 15.5  . Smokeless tobacco: Never Used  Substance Use Topics  . Alcohol use: No    Alcohol/week: 0.0 standard drinks  . Drug use: No   Family History  Problem Relation Age of Onset  . Colon cancer Father 28  . Diabetes Mother   . Heart disease Mother   . Kidney disease Mother        ESRD  . Stroke Mother   . Hypertension Brother   . Breast cancer Sister   . Diabetes Other  nephews  . Colon cancer Sister   . Esophageal cancer Neg Hx   . Stomach cancer Neg Hx   . Rectal cancer Neg Hx    Allergies  Allergen Reactions  . Bee Venom Anaphylaxis  . Other Anaphylaxis  . Hctz [Hydrochlorothiazide] Other (See Comments)    Felt weak all over  Felt weak all over   . Zyrtec Allergy [Cetirizine Hcl]     Doesn't help   Current Outpatient Medications on File Prior to Visit  Medication Sig Dispense Refill  . acetaminophen (TYLENOL) 325 MG tablet Take 650 mg by mouth every 6 (six) hours as needed.    Marland Kitchen albuterol (PROVENTIL HFA;VENTOLIN HFA) 108 (90 Base) MCG/ACT inhaler Inhale 2 puffs into the lungs every 4 (four) hours as needed for wheezing or shortness of breath. Please give spacer if possible 1 Inhaler 3  . amLODipine (NORVASC) 5 MG tablet Take 1 tablet (5 mg total) by mouth daily. 90 tablet 3  . amoxicillin-clavulanate (AUGMENTIN) 875-125 MG tablet Take 1 tablet by mouth 2 (two) times daily.  14 tablet 0  . benzonatate (TESSALON) 200 MG capsule Take 1 capsule (200 mg total) by mouth 3 (three) times daily as needed for cough. Do not bite pill 30 capsule 1  . Calcium & Magnesium Carbonates (MYLANTA PO) Take 1 capsule by mouth as directed.      . cyclobenzaprine (FLEXERIL) 5 MG tablet     . dicyclomine (BENTYL) 20 MG tablet Take 1 tablet (20 mg total) by mouth 4 (four) times daily -  before meals and at bedtime. 120 tablet 5  . docusate sodium (COLACE) 100 MG capsule Take 1 capsule (100 mg total) by mouth 2 (two) times daily. 60 capsule 0  . EPINEPHrine (EPIPEN IJ) Inject as directed as needed. Reported on 12/05/2015    . feeding supplement (BOOST / RESOURCE BREEZE) LIQD Take 1 Container by mouth 3 (three) times daily between meals. 90 Container 0  . fluticasone (FLONASE) 50 MCG/ACT nasal spray PLACE 2 SPRAYS INTO THE NOSE DAILY. 48 g 3  . folic acid (FOLVITE) 1 MG tablet Take 1 mg by mouth daily.    . hydrocortisone (ANUSOL-HC) 25 MG suppository Place 1 suppository (25 mg total) rectally 2 (two) times daily. For 7 days 14 suppository 0  . loratadine (CLARITIN) 10 MG tablet Take 10 mg by mouth daily.    . methotrexate (RHEUMATREX) 2.5 MG tablet Take 15 mg by mouth once a week. Caution:Chemotherapy. Protect from light. ( Monday of each week )    . pantoprazole (PROTONIX) 40 MG tablet Take 40 mg by mouth daily.  2  . polyethylene glycol (MIRALAX / GLYCOLAX) packet Take 17 g by mouth daily.    Marland Kitchen terconazole (TERAZOL 3) 0.8 % vaginal cream Place 1 applicator vaginally at bedtime. 20 g 0   Current Facility-Administered Medications on File Prior to Visit  Medication Dose Route Frequency Provider Last Rate Last Dose  . lidocaine (PF) (XYLOCAINE) 1 % injection 0.3 mL  0.3 mL Other Once Magnus Sinning, MD         Review of Systems  Constitutional: Negative for activity change, appetite change, fatigue, fever and unexpected weight change.  HENT: Positive for ear pain. Negative for congestion,  ear discharge, facial swelling, hearing loss, rhinorrhea, sinus pressure, sore throat and tinnitus.        Pain in L jaw joint  Eyes: Negative for photophobia, pain, discharge, redness and visual disturbance.  Respiratory: Negative for cough, shortness of  breath and wheezing.   Cardiovascular: Negative for chest pain and palpitations.  Gastrointestinal: Negative for abdominal pain, blood in stool, constipation and diarrhea.  Endocrine: Negative for polydipsia and polyuria.  Genitourinary: Negative for dysuria, frequency and urgency.  Musculoskeletal: Positive for arthralgias. Negative for back pain and myalgias.  Skin: Negative for pallor and rash.  Allergic/Immunologic: Negative for environmental allergies.  Neurological: Negative for dizziness, syncope and headaches.  Hematological: Negative for adenopathy. Does not bruise/bleed easily.  Psychiatric/Behavioral: Negative for decreased concentration and dysphoric mood. The patient is not nervous/anxious.        Objective:   Physical Exam Constitutional:      General: She is not in acute distress.    Appearance: Normal appearance. She is obese. She is not ill-appearing or diaphoretic.  HENT:     Head: Normocephalic and atraumatic.     Comments: Tenderness over L TM joint  Some popping with lateral movement      Right Ear: Tympanic membrane, ear canal and external ear normal. There is no impacted cerumen.     Left Ear: Tympanic membrane, ear canal and external ear normal. There is no impacted cerumen.     Nose: Nose normal. No congestion or rhinorrhea.     Mouth/Throat:     Mouth: Mucous membranes are moist.     Pharynx: Oropharynx is clear. No posterior oropharyngeal erythema.  Eyes:     General: No scleral icterus.       Right eye: No discharge.        Left eye: No discharge.     Extraocular Movements: Extraocular movements intact.     Pupils: Pupils are equal, round, and reactive to light.  Neck:     Musculoskeletal: Normal  range of motion.     Vascular: No carotid bruit.  Cardiovascular:     Rate and Rhythm: Normal rate and regular rhythm.  Pulmonary:     Effort: Pulmonary effort is normal. No respiratory distress.     Breath sounds: Normal breath sounds. No wheezing or rales.  Musculoskeletal:     Comments: Knee joints are slightly warm  Not swollen  Tender especially medially  Gait is guarded -not using device to walk  Lymphadenopathy:     Cervical: No cervical adenopathy.  Skin:    General: Skin is warm and dry.     Coloration: Skin is not pale.     Findings: No erythema or rash.  Neurological:     Mental Status: She is alert. Mental status is at baseline.     Cranial Nerves: No cranial nerve deficit.     Coordination: Coordination normal.     Deep Tendon Reflexes: Reflexes normal.  Psychiatric:        Mood and Affect: Mood normal.           Assessment & Plan:   Problem List Items Addressed This Visit      Musculoskeletal and Integument   RA (rheumatoid arthritis) ([place]) - Primary    Moderate to severe knee pain with RA Working on patient assist with Humira  Has had injections in the past Cardiac status is stable- I think it is safe to try meloxicam short term to get her by until her rheumatologist appt   (diclofinac gel did not help) Px given for 30 day supply  inst to take with food  Alert and hold if any side effects      Relevant Medications   acetaminophen (TYLENOL) 325 MG tablet   meloxicam (MOBIC)  15 MG tablet   TMJ (dislocation of temporomandibular joint)    Suspect TMJ pain is radiating to her ear based on exam  Given 30 day supply px of meloxicam  Ice/heat  Avoid chewy foods Drink with straw No gum  If no imp after jaw rest consider dental visit/ bite guard

## 2019-04-30 NOTE — Assessment & Plan Note (Signed)
Suspect TMJ pain is radiating to her ear based on exam  Given 30 day supply px of meloxicam  Ice/heat  Avoid chewy foods Drink with straw No gum  If no imp after jaw rest consider dental visit/ bite guard

## 2019-04-30 NOTE — Assessment & Plan Note (Signed)
Moderate to severe knee pain with RA Working on patient assist with Humira  Has had injections in the past Cardiac status is stable- I think it is safe to try meloxicam short term to get her by until her rheumatologist appt   (diclofinac gel did not help) Px given for 30 day supply  inst to take with food  Alert and hold if any side effects

## 2019-05-02 ENCOUNTER — Ambulatory Visit: Payer: Medicare Other | Admitting: Family Medicine

## 2019-05-03 ENCOUNTER — Telehealth: Payer: Self-pay | Admitting: Family Medicine

## 2019-05-03 DIAGNOSIS — M0579 Rheumatoid arthritis with rheumatoid factor of multiple sites without organ or systems involvement: Secondary | ICD-10-CM | POA: Diagnosis not present

## 2019-05-03 DIAGNOSIS — M25561 Pain in right knee: Secondary | ICD-10-CM | POA: Diagnosis not present

## 2019-05-03 DIAGNOSIS — G8929 Other chronic pain: Secondary | ICD-10-CM | POA: Diagnosis not present

## 2019-05-03 DIAGNOSIS — M25562 Pain in left knee: Secondary | ICD-10-CM | POA: Diagnosis not present

## 2019-05-03 NOTE — Telephone Encounter (Signed)
Called patient, left message with family member for the patient to call our office back.   If patient calls back we just need to see if she can do her visit on the 17th virtually or by phone.  See note from Dr Glori Bickers

## 2019-05-03 NOTE — Telephone Encounter (Signed)
Patient was in the office on the 10th for knee pain.  She called today about her follow up on the 17th and wanted to see if you would still like to see her or if she needs to cancel that appointment?     Patient's C/B # [phone] Patient stated a message could be left on V/M if she did not answer

## 2019-05-03 NOTE — Telephone Encounter (Signed)
I think it is just to review her labs Since she was just here for other issues- we could do f/u virtually or by phone so she does not have to come in

## 2019-05-04 NOTE — Telephone Encounter (Signed)
Pt set up phone call, no vodeo, for this follow up.

## 2019-05-04 NOTE — Telephone Encounter (Signed)
That is fine 

## 2019-05-06 ENCOUNTER — Ambulatory Visit (INDEPENDENT_AMBULATORY_CARE_PROVIDER_SITE_OTHER): Payer: Medicare Other | Admitting: Family Medicine

## 2019-05-06 ENCOUNTER — Encounter: Payer: Self-pay | Admitting: Family Medicine

## 2019-05-06 DIAGNOSIS — E118 Type 2 diabetes mellitus with unspecified complications: Secondary | ICD-10-CM

## 2019-05-06 DIAGNOSIS — E785 Hyperlipidemia, unspecified: Secondary | ICD-10-CM | POA: Diagnosis not present

## 2019-05-06 DIAGNOSIS — I10 Essential (primary) hypertension: Secondary | ICD-10-CM

## 2019-05-06 DIAGNOSIS — M069 Rheumatoid arthritis, unspecified: Secondary | ICD-10-CM

## 2019-05-06 DIAGNOSIS — E1169 Type 2 diabetes mellitus with other specified complication: Secondary | ICD-10-CM | POA: Diagnosis not present

## 2019-05-06 MED ORDER — ATORVASTATIN CALCIUM 10 MG PO TABS: 5.0000 mg | ORAL_TABLET | ORAL | 3 refills | Status: DC

## 2019-05-06 NOTE — Assessment & Plan Note (Signed)
Very mild  LDL 80 Disc goals for lipids and reasons to control them Rev last labs with pt Rev low sat fat diet in detail Px atorvastatin to take 5 mg QOD watching for pain as side eff F/u 6 mo

## 2019-05-06 NOTE — Assessment & Plan Note (Signed)
Will begin methotrexate on Tuesday Back on low dose prednisone-hopefully not for long Watching DM2

## 2019-05-06 NOTE — Assessment & Plan Note (Signed)
Lab Results  Component Value Date   HGBA1C 6.8 (H) 04/28/2019   No medicines Does not check glucose at home Diet controlled Fairly stable Unable to exercise due to RA currently  No ace due to h/o anaphylaxis with bee stings microal ok in January  She will schedule her eye exam  F/u 6 mo Starting low dose statin today

## 2019-05-06 NOTE — Assessment & Plan Note (Signed)
bp in fair control at this time  BP Readings from Last 1 Encounters:  04/29/19 126/64   No changes needed Most recent labs reviewed  Disc lifstyle change with low sodium diet and exercise

## 2019-05-06 NOTE — Progress Notes (Signed)
Virtual Visit via Telephone Note  I connected with [name] on 05/06/19 at  2:00 PM EDT by telephone and verified that I am speaking with the correct person using two identifiers.  Location: Patient: home Provider: office    I discussed the limitations, risks, security and privacy concerns of performing an evaluation and management service by telephone and the availability of in person appointments. I also discussed with the patient that there may be a patient responsible charge related to this service. The patient expressed understanding and agreed to proceed.   History of Present Illness: Pt presents for f/u of chronic health problems /rev of labs   Feels ok overall    Was here recently  Wt Readings from Last 3 Encounters:  04/29/19 199 lb (90.3 kg)  12/21/18 204 lb 7 oz (92.7 kg)  10/29/18 202 lb 8 oz (91.9 kg)  wt was down 5 lb  Has to eat a home so eating better in general  Her appetite comes and goes    bp is stable on 7/10 No cp or palpitations or headaches or edema  No side effects to medicines  BP Readings from Last 3 Encounters:  04/29/19 126/64  12/21/18 130/78  10/29/18 135/80     Lab Results  Component Value Date   CREATININE 0.92 04/28/2019   BUN 6 04/28/2019   NA 137 04/28/2019   K 4.1 04/28/2019   CL 102 04/28/2019   CO2 25 04/28/2019   Lab Results  Component Value Date   ALT 12 04/28/2019   AST 21 04/28/2019   ALKPHOS 72 04/28/2019   BILITOT 0.5 04/28/2019     DM2 Diabetes Home sugar results -does not check  DM diet -watches out for starches and sweets  Exercise - not much due to joint pain  Symptoms -no excessive thirst  A1C last  Lab Results  Component Value Date   HGBA1C 6.8 (H) 04/28/2019  up slightly from 6.5  She is back on low dose prednisone short term   No problems with medications  Renal protection Lab Results  Component Value Date   MICROALBUR 0.9 10/29/2018   cannot have ace due to hx of anaphylaxis with bee  venom Last eye exam  -has not had one in the past year  She will make her own appointments  Not on statin   RA  Going to rheumatology - looking at trying shots of methotrexate  Will start on Tuesday  Last visit given meloxicam prn for use until next f/u  They did help a bit    Cholesterol Lab Results  Component Value Date   CHOL 166 04/28/2019   CHOL 168 10/26/2018   CHOL 117 10/23/2017   Lab Results  Component Value Date   HDL 45.70 04/28/2019   HDL 43.20 10/26/2018   HDL 35.00 (L) 10/23/2017   Lab Results  Component Value Date   LDLCALC 82 04/28/2019   LDLCALC 50 10/23/2017   LDLCALC 78 10/07/2016   Lab Results  Component Value Date   TRIG 191.0 (H) 04/28/2019   TRIG 221.0 (H) 10/26/2018   TRIG 160.0 (H) 10/23/2017   Lab Results  Component Value Date   CHOLHDL 4 04/28/2019   CHOLHDL 4 10/26/2018   CHOLHDL 3 10/23/2017   Lab Results  Component Value Date   LDLDIRECT 60.0 10/26/2018  fair  Trig and LDL are up  Diet is not changed  No sausage/bacon  occ gravy/biscuit  Some chicken/avoids fried   Patient Active  Problem List   Diagnosis Date Noted  . Hyperlipidemia associated with type 2 diabetes mellitus ([place]) 05/06/2019  . TMJ (dislocation of temporomandibular joint) 04/29/2019  . Encounter for screening mammogram for breast cancer 10/27/2017  . Routine general medical examination at a health care facility 10/27/2017  . Abdominal pain 03/31/2017  . Aortic atherosclerosis ([place]) 02/03/2017  . RA (rheumatoid arthritis) ([place])   . Diabetes mellitus with complication ([place])   . Adjustment disorder with mixed anxiety and depressed mood 06/24/2016  . Essential hypertension 01/07/2016  . Coronary atherosclerosis 11/07/2015  . GERD 07/09/2007   Past Medical History:  Diagnosis Date  . Allergic rhinitis   . Anxiety   . Cataract 2015   bilateral  . Diabetes mellitus with complication ([place])   . Diverticulosis   . Esophageal reflux   . Essential  hypertension 01/07/2016  . Fatty liver 2018  . Fibula fracture 02/2005  . Former smoker   . Gastritis 2003  . Heart murmur   . Hemorrhoids   . History of nephrolithiasis   . Hyperglycemia   . Hyperlipemia   . Osteoarthritis   . Personal history of colonic polyps 1998   hyperplastic  . RA (rheumatoid arthritis) ([place])   . Shingles 2012  . Uterine fibroid    Past Surgical History:  Procedure Laterality Date  . CHOLECYSTECTOMY    . COLONOSCOPY  11/08   internal hemorrhoids  . EP procedure  1994   SVT; normal echo  . ESOPHAGOGASTRODUODENOSCOPY  11/03   reactive gastropathy  . LIPOMA EXCISION  11/2002  . SVT s/p surgery--? ablation    . UPPER GASTROINTESTINAL ENDOSCOPY    . vaginal polyp excised  12/2000   benign   Social History   Tobacco Use  . Smoking status: Former Smoker    Quit date: 10/21/2003    Years since quitting: 15.5  . Smokeless tobacco: Never Used  Substance Use Topics  . Alcohol use: No    Alcohol/week: 0.0 standard drinks  . Drug use: No   Family History  Problem Relation Age of Onset  . Colon cancer Father 7  . Diabetes Mother   . Heart disease Mother   . Kidney disease Mother        ESRD  . Stroke Mother   . Hypertension Brother   . Breast cancer Sister   . Diabetes Other        nephews  . Colon cancer Sister   . Esophageal cancer Neg Hx   . Stomach cancer Neg Hx   . Rectal cancer Neg Hx    Allergies  Allergen Reactions  . Bee Venom Anaphylaxis  . Other Anaphylaxis  . Hctz [Hydrochlorothiazide] Other (See Comments)    Felt weak all over  Felt weak all over   . Zyrtec Allergy [Cetirizine Hcl]     Doesn't help   Current Outpatient Medications on File Prior to Visit  Medication Sig Dispense Refill  . acetaminophen (TYLENOL) 325 MG tablet Take 650 mg by mouth every 6 (six) hours as needed.    Marland Kitchen albuterol (PROVENTIL HFA;VENTOLIN HFA) 108 (90 Base) MCG/ACT inhaler Inhale 2 puffs into the lungs every 4 (four) hours as needed for wheezing or  shortness of breath. Please give spacer if possible 1 Inhaler 3  . amLODipine (NORVASC) 5 MG tablet Take 1 tablet (5 mg total) by mouth daily. 90 tablet 3  . Calcium & Magnesium Carbonates (MYLANTA PO) Take 1 capsule by mouth as directed.      Marland Kitchen  cyclobenzaprine (FLEXERIL) 5 MG tablet     . dicyclomine (BENTYL) 20 MG tablet Take 1 tablet (20 mg total) by mouth 4 (four) times daily -  before meals and at bedtime. 120 tablet 5  . docusate sodium (COLACE) 100 MG capsule Take 1 capsule (100 mg total) by mouth 2 (two) times daily. 60 capsule 0  . EPINEPHrine (EPIPEN IJ) Inject as directed as needed. Reported on 12/05/2015    . feeding supplement (BOOST / RESOURCE BREEZE) LIQD Take 1 Container by mouth 3 (three) times daily between meals. 90 Container 0  . fluticasone (FLONASE) 50 MCG/ACT nasal spray PLACE 2 SPRAYS INTO THE NOSE DAILY. 48 g 3  . folic acid (FOLVITE) 1 MG tablet Take 1 mg by mouth daily.    . hydrocortisone (ANUSOL-HC) 25 MG suppository Place 1 suppository (25 mg total) rectally 2 (two) times daily. For 7 days 14 suppository 0  . loratadine (CLARITIN) 10 MG tablet Take 10 mg by mouth daily.    . meloxicam (MOBIC) 15 MG tablet Take 1 tablet (15 mg total) by mouth daily as needed for pain. With a meal 30 tablet 0  . methotrexate (RHEUMATREX) 2.5 MG tablet Take 15 mg by mouth once a week. Caution:Chemotherapy. Protect from light. ( Monday of each week )    . pantoprazole (PROTONIX) 40 MG tablet Take 40 mg by mouth daily.  2  . polyethylene glycol (MIRALAX / GLYCOLAX) packet Take 17 g by mouth daily.    Marland Kitchen terconazole (TERAZOL 3) 0.8 % vaginal cream Place 1 applicator vaginally at bedtime. 20 g 0   Current Facility-Administered Medications on File Prior to Visit  Medication Dose Route Frequency Provider Last Rate Last Dose  . lidocaine (PF) (XYLOCAINE) 1 % injection 0.3 mL  0.3 mL Other Once Magnus Sinning, MD       Review of Systems  Constitutional: Negative for chills, fever and  malaise/fatigue.  HENT: Negative for sore throat.   Eyes: Negative for blurred vision, discharge and redness.  Respiratory: Negative for cough and shortness of breath.   Cardiovascular: Negative for chest pain and palpitations.  Gastrointestinal: Negative for heartburn and nausea.  Musculoskeletal:       Joint swelling and pain from RA  Skin: Negative for rash.  Neurological: Negative for dizziness and headaches.  Endo/Heme/Allergies: Negative for polydipsia.  Psychiatric/Behavioral: Negative for depression.     Observations/Objective: Pt sounds like her usual self Not distressed Not hoarse  No sob or cough  Cognitively baseline- normal  Affect is cheerful today  Assessment and Plan: Problem List Items Addressed This Visit      Cardiovascular and Mediastinum   Essential hypertension - Primary    bp in fair control at this time  BP Readings from Last 1 Encounters:  04/29/19 126/64   No changes needed Most recent labs reviewed  Disc lifstyle change with low sodium diet and exercise        Relevant Medications   atorvastatin (LIPITOR) 10 MG tablet     Endocrine   Diabetes mellitus with complication (HCC)    Lab Results  Component Value Date   HGBA1C 6.8 (H) 04/28/2019   No medicines Does not check glucose at home Diet controlled Fairly stable Unable to exercise due to RA currently  No ace due to h/o anaphylaxis with bee stings microal ok in January  She will schedule her eye exam  F/u 6 mo Starting low dose statin today      Relevant Medications  atorvastatin (LIPITOR) 10 MG tablet   Hyperlipidemia associated with type 2 diabetes mellitus (HCC)    Very mild  LDL 80 Disc goals for lipids and reasons to control them Rev last labs with pt Rev low sat fat diet in detail Px atorvastatin to take 5 mg QOD watching for pain as side eff F/u 6 mo      Relevant Medications   atorvastatin (LIPITOR) 10 MG tablet     Musculoskeletal and Integument   RA  (rheumatoid arthritis) ([place])    Will begin methotrexate on Tuesday Back on low dose prednisone-hopefully not for long Watching DM2           Follow Up Instructions: Please make your own eye doctor appointment -you are overdue   Start the methotrexate on Tuesday  Start atorvastatin low dose 5 mg every other day for cholesterol and to decrease risk of vascular disease with diabetes   Try to get most of your carbohydrates from produce (with the exception of white potatoes)  Eat less bread/pasta/rice/snack foods/cereals/sweets and other items from the middle of the grocery store (processed carbs)   Get more active when arthritis allows you to be   We will see you in 6 months   I discussed the assessment and treatment plan with the patient. The patient was provided an opportunity to ask questions and all were answered. The patient agreed with the plan and demonstrated an understanding of the instructions.   The patient was advised to call back or seek an in-person evaluation if the symptoms worsen or if the condition fails to improve as anticipated.  I provided 16 minutes of non-face-to-face time during this encounter.   Loura Pardon, MD

## 2019-05-06 NOTE — Patient Instructions (Addendum)
Please make your own eye doctor appointment -you are overdue   Start the methotrexate on Tuesday  Start atorvastatin low dose 5 mg every other day for cholesterol and to decrease risk of vascular disease with diabetes   Try to get most of your carbohydrates from produce (with the exception of white potatoes)  Eat less bread/pasta/rice/snack foods/cereals/sweets and other items from the middle of the grocery store (processed carbs)   Get more active when arthritis allows you to be   We will see you in 6 months

## 2019-05-10 DIAGNOSIS — M0579 Rheumatoid arthritis with rheumatoid factor of multiple sites without organ or systems involvement: Secondary | ICD-10-CM | POA: Diagnosis not present

## 2019-05-16 ENCOUNTER — Telehealth: Payer: Self-pay | Admitting: Family Medicine

## 2019-05-16 DIAGNOSIS — M069 Rheumatoid arthritis, unspecified: Secondary | ICD-10-CM

## 2019-05-16 NOTE — Telephone Encounter (Signed)
Pt notified order ready for pick up

## 2019-05-16 NOTE — Telephone Encounter (Signed)
Patient called and stated that she is needing a prescription so she can get a walker.  She stated that the facility will be working with her to get this    C/B # [phone]

## 2019-05-16 NOTE — Telephone Encounter (Signed)
I assume this is for her arthritis, correct?   What facility?  Did they recommend wheels or no wheels?  Seat or no seat? (or neither)  Thanks  Let me know and I will get on it

## 2019-05-16 NOTE — Telephone Encounter (Signed)
Pt said it is for her arthritis she said it's hard to get up and then it hurts to walk sometimes when it's flaring up. Pt said she looked up a walker and it's one with wheels and a seat. It's also tall so pt wont have to bend down as much. Pt said it's not a facility that's working with her it's a [facility], pt wasn't sure of the name of the company because her kids are taking care of that for her, pt said they just told her to get the odor on Rx paper and the DME company will work with her insurance to try and get it approved

## 2019-05-16 NOTE — Telephone Encounter (Signed)
I put order in the IN box

## 2019-05-20 DIAGNOSIS — M069 Rheumatoid arthritis, unspecified: Secondary | ICD-10-CM | POA: Diagnosis not present

## 2019-05-22 ENCOUNTER — Other Ambulatory Visit: Payer: Self-pay | Admitting: Family Medicine

## 2019-05-23 NOTE — Telephone Encounter (Signed)
Last filled on 04/29/19 #30 tabs with 0 refills, please advise

## 2019-05-23 NOTE — Telephone Encounter (Signed)
I was just giving this to her short term until she could get into rheumatology   Right now I think she is on methotrexate injections and also was on prednisone (unsure if she still is)   I would not continue the meloxicam under those circumstances Thanks

## 2019-05-24 ENCOUNTER — Encounter: Payer: Self-pay | Admitting: Family Medicine

## 2019-05-24 ENCOUNTER — Ambulatory Visit (INDEPENDENT_AMBULATORY_CARE_PROVIDER_SITE_OTHER): Payer: Medicare Other | Admitting: Family Medicine

## 2019-05-24 ENCOUNTER — Other Ambulatory Visit: Payer: Self-pay

## 2019-05-24 VITALS — BP 154/68 | HR 79 | Temp 98.0°F | Ht 66.75 in

## 2019-05-24 DIAGNOSIS — M069 Rheumatoid arthritis, unspecified: Secondary | ICD-10-CM | POA: Diagnosis not present

## 2019-05-24 NOTE — Progress Notes (Signed)
Subjective:    Patient ID: NEVAE PINNIX, female    DOB: 02/01/1941, 78 y.o.   MRN: [id number]  HPI  Pt presents for leg pain   Had a recent methotrexate injection  After that "can't walk"  Thought it is worse in L leg  She did call Dr Scharlene Gloss office and they asked her to come in   Leg pain got worse 2 days after her injection  Both legs hurt  Hurts from ankle up to the hip on both sides  occ back pain  Hands occ bother her  More in the shoulders  Still on prednisone   Needs a referral to rheumatology -w[facility] to [facility]   Knees are swollen  No red joints  No hot joints   Pain is severe   Can bear weight with a crutch- no more than that   Taking 5 mg of prednisone now  Tylenol  No oral mtx  No other rheumatoid medicines   Recently sed rate 59 at Dr Jefm Bryant  She was unable to get Humira covered by ins so far   New walker is coming soon - wheels and seat   Patient Active Problem List   Diagnosis Date Noted  . Hyperlipidemia associated with type 2 diabetes mellitus ([place]) 05/06/2019  . TMJ (dislocation of temporomandibular joint) 04/29/2019  . Encounter for screening mammogram for breast cancer 10/27/2017  . Routine general medical examination at a health care facility 10/27/2017  . Abdominal pain 03/31/2017  . Aortic atherosclerosis ([place]) 02/03/2017  . RA (rheumatoid arthritis) ([place])   . Diabetes mellitus with complication ([place])   . Adjustment disorder with mixed anxiety and depressed mood 06/24/2016  . Essential hypertension 01/07/2016  . Coronary atherosclerosis 11/07/2015  . GERD 07/09/2007   Past Medical History:  Diagnosis Date  . Allergic rhinitis   . Anxiety   . Cataract 2015   bilateral  . Diabetes mellitus with complication ([place])   . Diverticulosis   . Esophageal reflux   . Essential hypertension 01/07/2016  . Fatty liver 2018  . Fibula fracture 02/2005  . Former smoker   . Gastritis 2003  . Heart murmur   . Hemorrhoids   .  History of nephrolithiasis   . Hyperglycemia   . Hyperlipemia   . Osteoarthritis   . Personal history of colonic polyps 1998   hyperplastic  . RA (rheumatoid arthritis) ([place])   . Shingles 2012  . Uterine fibroid    Past Surgical History:  Procedure Laterality Date  . CHOLECYSTECTOMY    . COLONOSCOPY  11/08   internal hemorrhoids  . EP procedure  1994   SVT; normal echo  . ESOPHAGOGASTRODUODENOSCOPY  11/03   reactive gastropathy  . LIPOMA EXCISION  11/2002  . SVT s/p surgery--? ablation    . UPPER GASTROINTESTINAL ENDOSCOPY    . vaginal polyp excised  12/2000   benign   Social History   Tobacco Use  . Smoking status: Former Smoker    Quit date: 10/21/2003    Years since quitting: 15.6  . Smokeless tobacco: Never Used  Substance Use Topics  . Alcohol use: No    Alcohol/week: 0.0 standard drinks  . Drug use: No   Family History  Problem Relation Age of Onset  . Colon cancer Father 87  . Diabetes Mother   . Heart disease Mother   . Kidney disease Mother        ESRD  . Stroke Mother   . Hypertension  Brother   . Breast cancer Sister   . Diabetes Other        nephews  . Colon cancer Sister   . Esophageal cancer Neg Hx   . Stomach cancer Neg Hx   . Rectal cancer Neg Hx    Allergies  Allergen Reactions  . Bee Venom Anaphylaxis  . Other Anaphylaxis  . Hctz [Hydrochlorothiazide] Other (See Comments)    Felt weak all over  Felt weak all over   . Zyrtec Allergy [Cetirizine Hcl]     Doesn't help   Current Outpatient Medications on File Prior to Visit  Medication Sig Dispense Refill  . acetaminophen (TYLENOL) 325 MG tablet Take 650 mg by mouth every 6 (six) hours as needed.    Marland Kitchen albuterol (PROVENTIL HFA;VENTOLIN HFA) 108 (90 Base) MCG/ACT inhaler Inhale 2 puffs into the lungs every 4 (four) hours as needed for wheezing or shortness of breath. Please give spacer if possible 1 Inhaler 3  . amLODipine (NORVASC) 5 MG tablet Take 1 tablet (5 mg total) by mouth daily. 90  tablet 3  . atorvastatin (LIPITOR) 10 MG tablet Take 0.5 tablets (5 mg total) by mouth every other day. 23 tablet 3  . Calcium & Magnesium Carbonates (MYLANTA PO) Take 1 capsule by mouth as directed.      . cyclobenzaprine (FLEXERIL) 5 MG tablet     . dicyclomine (BENTYL) 20 MG tablet Take 1 tablet (20 mg total) by mouth 4 (four) times daily -  before meals and at bedtime. 120 tablet 5  . docusate sodium (COLACE) 100 MG capsule Take 1 capsule (100 mg total) by mouth 2 (two) times daily. 60 capsule 0  . EPINEPHrine (EPIPEN IJ) Inject as directed as needed. Reported on 12/05/2015    . feeding supplement (BOOST / RESOURCE BREEZE) LIQD Take 1 Container by mouth 3 (three) times daily between meals. 90 Container 0  . fluticasone (FLONASE) 50 MCG/ACT nasal spray PLACE 2 SPRAYS INTO THE NOSE DAILY. 48 g 3  . folic acid (FOLVITE) 1 MG tablet Take 1 mg by mouth daily.    . hydrocortisone (ANUSOL-HC) 25 MG suppository Place 1 suppository (25 mg total) rectally 2 (two) times daily. For 7 days 14 suppository 0  . loratadine (CLARITIN) 10 MG tablet Take 10 mg by mouth daily.    . meloxicam (MOBIC) 15 MG tablet Take 1 tablet (15 mg total) by mouth daily as needed for pain. With a meal 30 tablet 0  . methotrexate (RHEUMATREX) 2.5 MG tablet Take 15 mg by mouth once a week. Caution:Chemotherapy. Protect from light. ( Monday of each week )    . pantoprazole (PROTONIX) 40 MG tablet Take 40 mg by mouth daily.  2  . polyethylene glycol (MIRALAX / GLYCOLAX) packet Take 17 g by mouth daily.    Marland Kitchen terconazole (TERAZOL 3) 0.8 % vaginal cream Place 1 applicator vaginally at bedtime. 20 g 0   Current Facility-Administered Medications on File Prior to Visit  Medication Dose Route Frequency Provider Last Rate Last Dose  . lidocaine (PF) (XYLOCAINE) 1 % injection 0.3 mL  0.3 mL Other Once Magnus Sinning, MD        Review of Systems  Constitutional: Positive for activity change and fatigue. Negative for appetite change,  chills, diaphoresis, fever and unexpected weight change.  HENT: Negative for congestion, ear pain, rhinorrhea, sinus pressure and sore throat.   Eyes: Negative for pain, redness and visual disturbance.  Respiratory: Negative for cough, shortness of breath  and wheezing.   Cardiovascular: Negative for chest pain and palpitations.  Gastrointestinal: Negative for abdominal pain, blood in stool, constipation and diarrhea.  Endocrine: Negative for polydipsia and polyuria.  Genitourinary: Negative for dysuria, frequency and urgency.  Musculoskeletal: Positive for arthralgias and myalgias. Negative for back pain, joint swelling and neck pain.  Skin: Negative for pallor and rash.  Allergic/Immunologic: Negative for environmental allergies.  Neurological: Negative for dizziness, syncope, light-headedness and headaches.  Hematological: Negative for adenopathy. Does not bruise/bleed easily.  Psychiatric/Behavioral: Negative for decreased concentration and dysphoric mood. The patient is not nervous/anxious.        Objective:   Physical Exam Constitutional:      General: She is not in acute distress.    Appearance: Normal appearance. She is obese. She is not ill-appearing.     Comments: In wheel chair  Not distressed   HENT:     Mouth/Throat:     Mouth: Mucous membranes are moist.  Eyes:     General: No scleral icterus.    Extraocular Movements: Extraocular movements intact.     Conjunctiva/sclera: Conjunctivae normal.     Pupils: Pupils are equal, round, and reactive to light.  Neck:     Musculoskeletal: Normal range of motion and neck supple. No neck rigidity.  Cardiovascular:     Rate and Rhythm: Normal rate and regular rhythm.     Pulses: Normal pulses.  Pulmonary:     Effort: Pulmonary effort is normal. No respiratory distress.     Breath sounds: Normal breath sounds. No stridor. No wheezing.  Musculoskeletal:        General: Tenderness present.     Right lower leg: No edema.      Left lower leg: No edema.     Comments: No erythematous or warm joints Tender MCPs  Tender knees (med and lat joint lines) with crepitus and worse pain with movement and weight bearing  Nl rom ankles/feet Some hip pain with flexion while sitting   Some tenderness of quadriceps muscles and trapezius areas  Pt is unable to walk w/o assistance due to pain   Lymphadenopathy:     Cervical: No cervical adenopathy.  Skin:    General: Skin is warm and dry.     Coloration: Skin is not pale.     Findings: No erythema or rash.  Neurological:     Mental Status: She is alert. Mental status is at baseline.     Sensory: No sensory deficit.     Comments: Cannot assess gait-in too much pain to bear weight   Psychiatric:        Behavior: Behavior normal.     Comments: Mildly anxious            Assessment & Plan:   Problem List Items Addressed This Visit      Musculoskeletal and Integument   RA (rheumatoid arthritis) (HCC) - Primary    Marked increase in leg pain since pt had her MTX injection late last month (some shoulder and hands) for aggressive sero positive RA She is almost unable to walk due to pain (no weakness) No trauma or signs of fracture  ? If myalgia Carolee Rota could be a side effect  She does not want to return to her current rheumatologist  Referral done to est with a new one (she pref [place])  Continues low dose prednisone  inst to watch for fever/rash or joint swelling or redness       Relevant Orders   Ambulatory  referral to Rheumatology

## 2019-05-24 NOTE — Assessment & Plan Note (Signed)
Marked increase in leg pain since pt had her MTX injection late last month (some shoulder and hands) for aggressive sero positive RA She is almost unable to walk due to pain (no weakness) No trauma or signs of fracture  ? If myalgia Carolee Rota could be a side effect  She does not want to return to her current rheumatologist  Referral done to est with a new one (she pref [place])  Continues low dose prednisone  inst to watch for fever/rash or joint swelling or redness

## 2019-05-24 NOTE — Patient Instructions (Signed)
The office will call you with referral to rheumatology   Continue the rest of your prednisone and tylenol   Warm compresses may also help  Keep moving your legs so you do not get stiff  If fever or rash/ let us know  If any red /hot joints let me know

## 2019-06-07 ENCOUNTER — Telehealth: Payer: Self-pay | Admitting: Family Medicine

## 2019-06-07 NOTE — Telephone Encounter (Signed)
Paperwork faxed °

## 2019-06-07 NOTE — Telephone Encounter (Signed)
Done and in IN box 

## 2019-06-07 NOTE — Telephone Encounter (Signed)
antionette (daughter) dropped off FMLA paperwork In dr tower in box for review and signature

## 2019-06-08 NOTE — Telephone Encounter (Signed)
antionette aware paperwork has been faxed Copy for pt Copy for scan

## 2019-06-22 ENCOUNTER — Telehealth: Payer: Self-pay | Admitting: Family Medicine

## 2019-06-22 NOTE — Telephone Encounter (Signed)
Let me know if the meloxicam helps at all  Looks like she had tramadol in 2018 - it is sedating and habit forming but can be used for an emergency  Does she remember if she tolerated that ?

## 2019-06-22 NOTE — Telephone Encounter (Signed)
Please ask what she is taking now for pain - I know we gave her meloxicam recently.

## 2019-06-22 NOTE — Telephone Encounter (Signed)
Patient says she is taking tylenol and prednisone 5mg  1 daily that she thinks Dr. Jefm Bryant gave her at some point.   She has only 4 pills left of her Meloxicam but she can't remember if that helped her or not.  She will take 1 this pm but wants to know what else you can recommend?  I told her it would be tomorrow before we could get back in touch with her.  She is okay with that.

## 2019-06-22 NOTE — Telephone Encounter (Signed)
Patient called.  Patient was referred to [facility] for her arthritis.  Patient has appointment with Dr.Beekman on 07/13/19. Patient wants to know if she can have pain medication call in to [facility] until her appointment with Dr.Beekman.

## 2019-06-23 MED ORDER — TRAMADOL HCL 50 MG PO TABS: 50.0000 mg | ORAL_TABLET | Freq: Three times a day (TID) | ORAL | 0 refills | Status: AC | PRN

## 2019-06-23 NOTE — Telephone Encounter (Signed)
meloxicam is a dose every 24 hours-so do NOT take it early  (have her make sure is specifies that on the bottle)  I can send a small amt of tramadol to her pharmacy-only for severe pain  It causes sedation/ can increase risk of falls/ constipation and is habit forming   Use only if abs necessary

## 2019-06-23 NOTE — Telephone Encounter (Signed)
Pt notified of Dr. Marliss Coots comments and instructions and verbalized understanding. She will take meloxicam only once daily going forward she didn't realize it's a once a day med.

## 2019-06-23 NOTE — Telephone Encounter (Signed)
Pt did take the meloxicam last night she said it helped some with her pain but then she woke up in the middle of the night and the med had wore off and she had to take another one to be able to go back to sleep due to pain. Pt said she doesn't remember having any reaction to the tramadol so she is okay with getting whatever med Dr. Glori Bickers recommends. Pt uses [facility].

## 2019-06-29 DIAGNOSIS — M7989 Other specified soft tissue disorders: Secondary | ICD-10-CM | POA: Diagnosis not present

## 2019-06-29 DIAGNOSIS — M150 Primary generalized (osteo)arthritis: Secondary | ICD-10-CM | POA: Diagnosis not present

## 2019-06-29 DIAGNOSIS — M0579 Rheumatoid arthritis with rheumatoid factor of multiple sites without organ or systems involvement: Secondary | ICD-10-CM | POA: Diagnosis not present

## 2019-08-03 ENCOUNTER — Telehealth: Payer: Self-pay | Admitting: Family Medicine

## 2019-08-03 NOTE — Telephone Encounter (Signed)
Pt notified new order ready for pick up

## 2019-08-03 NOTE — Telephone Encounter (Signed)
Done and in IN box 

## 2019-08-03 NOTE — Telephone Encounter (Signed)
Patient stated she received a prescription awhile back for a walker. She has misplaced this and would like to see if she could get a new one so she is able to go get her walker.  Patient requesting a call back

## 2019-08-31 DIAGNOSIS — M150 Primary generalized (osteo)arthritis: Secondary | ICD-10-CM | POA: Diagnosis not present

## 2019-08-31 DIAGNOSIS — M0579 Rheumatoid arthritis with rheumatoid factor of multiple sites without organ or systems involvement: Secondary | ICD-10-CM | POA: Diagnosis not present

## 2019-10-21 DIAGNOSIS — U071 COVID-19: Secondary | ICD-10-CM

## 2019-10-21 HISTORY — DX: COVID-19: U07.1

## 2019-10-24 ENCOUNTER — Telehealth: Payer: Self-pay | Admitting: Family Medicine

## 2019-10-24 NOTE — Telephone Encounter (Signed)
Pt notified of Dr. Marliss Coots instructions and verbalized understanding. Pt will get OTC hydrocortisone cream and use that and will keep [place] posted

## 2019-10-24 NOTE — Telephone Encounter (Signed)
Patient called back.  Patient can't come in to the office.  Her husband just had surgery and he usually brings her to the office. Patient doesn't have a smart phone or computer for a virtual visit.

## 2019-10-24 NOTE — Telephone Encounter (Signed)
Patient called She wanted to know if you are able to send in a script to her pharmacy Patient stated she has a red rash located on the side of her face and neck.   Please advise

## 2019-10-24 NOTE — Telephone Encounter (Signed)
Pt notified of Dr. Marliss Coots comments and questions. Pt said yes the rash is itchy, but it's not painful at all. There are no blisters just little red bump. There is also no drainage. Pt said overall it's just a itchy red rash. Pt hasn't tried any medications or creams on rash she said she has been putting alcohol on it and it will help the itching for a short period of time then it will start back itching

## 2019-10-24 NOTE — Telephone Encounter (Signed)
I cannot really treat a rash without seeing it   Does it itch?  Any blisters?  Any pain or drainage? Is she putting anything on it?

## 2019-10-24 NOTE — Telephone Encounter (Signed)
Stop the alcohol- use gentle soap and water (dove)  Try over the counter hydrocortisone cream twice daily  F/u if no improvement or if it changes/ becomes painful

## 2019-10-25 NOTE — Telephone Encounter (Signed)
Patient called and stated that she contacted the pharmacy about getting the OTC medication.   Patient saidThe pharmacy advised her that a prescription is required for the medication

## 2019-10-25 NOTE — Telephone Encounter (Signed)
Pt notified of Dr. Marliss Coots instructions she will call the pharmacy back

## 2019-10-25 NOTE — Telephone Encounter (Signed)
I recommend something like "cort-aid" or it's generic,  it may be labeled as anti-itch [place] has a brand I like as well  Store brands are fine  These are over the counter

## 2019-11-02 ENCOUNTER — Ambulatory Visit: Payer: Medicare Other

## 2019-11-03 ENCOUNTER — Other Ambulatory Visit: Payer: Self-pay | Admitting: Family Medicine

## 2019-11-03 ENCOUNTER — Ambulatory Visit: Payer: Medicare Other | Attending: Internal Medicine

## 2019-11-03 DIAGNOSIS — Z20822 Contact with and (suspected) exposure to covid-19: Secondary | ICD-10-CM

## 2019-11-04 LAB — NOVEL CORONAVIRUS, NAA: SARS-CoV-2, NAA: DETECTED — AB

## 2019-11-07 ENCOUNTER — Ambulatory Visit: Payer: Medicare Other

## 2019-11-07 ENCOUNTER — Encounter: Payer: Medicare Other | Admitting: Family Medicine

## 2019-12-13 DIAGNOSIS — M150 Primary generalized (osteo)arthritis: Secondary | ICD-10-CM | POA: Diagnosis not present

## 2019-12-13 DIAGNOSIS — M0579 Rheumatoid arthritis with rheumatoid factor of multiple sites without organ or systems involvement: Secondary | ICD-10-CM | POA: Diagnosis not present

## 2019-12-18 ENCOUNTER — Ambulatory Visit: Payer: Medicare Other | Attending: Internal Medicine

## 2019-12-18 DIAGNOSIS — Z23 Encounter for immunization: Secondary | ICD-10-CM | POA: Insufficient documentation

## 2019-12-18 NOTE — Progress Notes (Signed)
   Covid-19 Vaccination Clinic  Name:  [name]    MRN: [id number] DOB: 14-Aug-1941  12/18/2019  [name] was observed post Covid-19 immunization for 15 minutes without incidence. She was provided with Vaccine Information Sheet and instruction to access the V-Safe system.   [name] was instructed to call 911 with any severe reactions post vaccine: Marland Kitchen Difficulty breathing  . Swelling of your face and throat  . A fast heartbeat  . A bad rash all over your body  . Dizziness and weakness    Immunizations Administered    Name Date Dose VIS Date Route   Pfizer COVID-19 Vaccine 12/18/2019 12:38 PM 0.3 mL 09/30/2019 Intramuscular   Manufacturer: [place]   Lot: [id number]   [place]: [id number]

## 2020-01-17 ENCOUNTER — Ambulatory Visit: Payer: Medicare Other | Attending: Internal Medicine

## 2020-01-17 DIAGNOSIS — Z23 Encounter for immunization: Secondary | ICD-10-CM

## 2020-01-17 NOTE — Progress Notes (Signed)
   Covid-19 Vaccination Clinic  Name:  [name]    MRN: [id number] DOB: August 24, 1941  01/17/2020  [name] was observed post Covid-19 immunization for 30 minutes based on pre-vaccination screening without incident. She was provided with Vaccine Information Sheet and instruction to access the V-Safe system.   [name] was instructed to call 911 with any severe reactions post vaccine: Marland Kitchen Difficulty breathing  . Swelling of face and throat  . A fast heartbeat  . A bad rash all over body  . Dizziness and weakness   Immunizations Administered    Name Date Dose VIS Date Route   Pfizer COVID-19 Vaccine 01/17/2020 11:14 AM 0.3 mL 09/30/2019 Intramuscular   Manufacturer: [place]   Lot: [id number]   [place]: [id number]

## 2020-01-18 ENCOUNTER — Ambulatory Visit: Payer: Medicare Other

## 2020-01-30 ENCOUNTER — Ambulatory Visit (INDEPENDENT_AMBULATORY_CARE_PROVIDER_SITE_OTHER): Payer: Medicare Other

## 2020-01-30 ENCOUNTER — Encounter: Payer: Self-pay | Admitting: Family Medicine

## 2020-01-30 ENCOUNTER — Other Ambulatory Visit: Payer: Self-pay

## 2020-01-30 ENCOUNTER — Ambulatory Visit (INDEPENDENT_AMBULATORY_CARE_PROVIDER_SITE_OTHER): Payer: Medicare Other | Admitting: Family Medicine

## 2020-01-30 VITALS — BP 132/84 | Temp 96.9°F | Ht 66.75 in | Wt 189.0 lb

## 2020-01-30 VITALS — BP 132/84 | HR 76 | Temp 96.9°F | Ht 66.75 in | Wt 189.2 lb

## 2020-01-30 DIAGNOSIS — I700 Atherosclerosis of aorta: Secondary | ICD-10-CM | POA: Diagnosis not present

## 2020-01-30 DIAGNOSIS — E119 Type 2 diabetes mellitus without complications: Secondary | ICD-10-CM

## 2020-01-30 DIAGNOSIS — R5383 Other fatigue: Secondary | ICD-10-CM | POA: Insufficient documentation

## 2020-01-30 DIAGNOSIS — R5382 Chronic fatigue, unspecified: Secondary | ICD-10-CM | POA: Diagnosis not present

## 2020-01-30 DIAGNOSIS — Z0000 Encounter for general adult medical examination without abnormal findings: Secondary | ICD-10-CM

## 2020-01-30 DIAGNOSIS — M069 Rheumatoid arthritis, unspecified: Secondary | ICD-10-CM | POA: Diagnosis not present

## 2020-01-30 LAB — CBC WITH DIFFERENTIAL/PLATELET
Basophils Absolute: 0.1 10*3/uL (ref 0.0–0.1)
Basophils Relative: 1.3 % (ref 0.0–3.0)
Eosinophils Absolute: 0.1 10*3/uL (ref 0.0–0.7)
Eosinophils Relative: 1.7 % (ref 0.0–5.0)
HCT: 40.1 % (ref 36.0–46.0)
Hemoglobin: 13.2 g/dL (ref 12.0–15.0)
Lymphocytes Relative: 30.2 % (ref 12.0–46.0)
Lymphs Abs: 1.2 10*3/uL (ref 0.7–4.0)
MCHC: 33 g/dL (ref 30.0–36.0)
MCV: 86.8 fl (ref 78.0–100.0)
Monocytes Absolute: 0.1 10*3/uL (ref 0.1–1.0)
Monocytes Relative: 3 % (ref 3.0–12.0)
Neutro Abs: 2.4 10*3/uL (ref 1.4–7.7)
Neutrophils Relative %: 63.8 % (ref 43.0–77.0)
Platelets: 330 10*3/uL (ref 150.0–400.0)
RBC: 4.62 Mil/uL (ref 3.87–5.11)
RDW: 16.5 % — ABNORMAL HIGH (ref 11.5–15.5)
WBC: 3.8 10*3/uL — ABNORMAL LOW (ref 4.0–10.5)

## 2020-01-30 LAB — BASIC METABOLIC PANEL
BUN: 10 mg/dL (ref 6–23)
CO2: 26 mEq/L (ref 19–32)
Calcium: 9 mg/dL (ref 8.4–10.5)
Chloride: 103 mEq/L (ref 96–112)
Creatinine, Ser: 0.81 mg/dL (ref 0.40–1.20)
GFR: 82.8 mL/min (ref >60.00)
Glucose, Bld: 103 mg/dL — ABNORMAL HIGH (ref 70–99)
Potassium: 3.8 mEq/L (ref 3.5–5.1)
Sodium: 137 mEq/L (ref 135–145)

## 2020-01-30 LAB — HEMOGLOBIN A1C: Hgb A1c MFr Bld: 6.3 % (ref 4.6–6.5)

## 2020-01-30 LAB — TSH: TSH: 1.72 u[IU]/mL (ref 0.35–4.50)

## 2020-01-30 NOTE — Assessment & Plan Note (Signed)
Pt still voices pain all over-but today is able to walk with a cane  Pain interferes with sleep, mood, energy level  Taking mtx oral- doubts ins will cover the inj Enc pt strongly to discuss pain control and further tx with her rheumatologist Dr Amil Amen

## 2020-01-30 NOTE — Assessment & Plan Note (Signed)
A1C today  Pt does not remember being diabetic  Disc imp of low glycemic diet  Has lost wt intentionally  - commended

## 2020-01-30 NOTE — Progress Notes (Signed)
Subjective:    Patient ID: [name], female    DOB: Sep 22, 1941, 79 y.o.   MRN: [id number]  This visit occurred during the SARS-CoV-2 public health emergency.  Safety protocols were in place, including screening questions prior to the visit, additional usage of staff PPE, and extensive cleaning of exam room while observing appropriate contact time as indicated for disinfecting solutions.    HPI Pt presents with c/o fatigue and arthritis pain   Wt Readings from Last 3 Encounters:  01/30/20 189 lb 3 oz (85.8 kg)  04/29/19 199 lb (90.3 kg)  12/21/18 204 lb 7 oz (92.7 kg)  has lost a fair amount of weight  29.85 kg/m   RA - seeing Dr Amil Amen for rheumatology  Taking methotrexate 15 mg weekly  (was looking into getting by shot- may not be affordable but does not know yet)  Taking folic acid  Off prednisone   Legs still give her a lot of trouble  Walking without assistance  Hands are swollen  Has OA as well  Takes tylenol   App rheumatology next month- knows he is not well controlled  He does communicates well   Stay tired  Multi factorial  Does not sleep well due to pain  Exercise-walking in the house - will be able to get outdoors when pollen calms down  Emotionally down at times due to pain as well (it is "depressing")  Declines counseling    Eats a balanced diet  Does get protein  Some dairy  Has vit D at home but does not take it      Lab Results  Component Value Date   TSH 0.96 04/28/2019    Lab Results  Component Value Date   CREATININE 0.92 04/28/2019   BUN 6 04/28/2019   NA 137 04/28/2019   K 4.1 04/28/2019   CL 102 04/28/2019   CO2 25 04/28/2019   Lab Results  Component Value Date   WBC 6.5 04/28/2019   HGB 13.9 04/28/2019   HCT 41.8 04/28/2019   MCV 88.8 04/28/2019   PLT 304.0 04/28/2019     Mild/DM  Lab Results  Component Value Date   HGBA1C 6.8 (H) 04/28/2019    Patient Active Problem List   Diagnosis Date Noted  . Fatigue  01/30/2020  . Hyperlipidemia associated with type 2 diabetes mellitus ([place]) 05/06/2019  . TMJ (dislocation of temporomandibular joint) 04/29/2019  . Encounter for screening mammogram for breast cancer 10/27/2017  . Routine general medical examination at a health care facility 10/27/2017  . Aortic atherosclerosis ([place]) 02/03/2017  . RA (rheumatoid arthritis) ([place])   . Controlled type 2 diabetes mellitus without complication, without long-term current use of insulin ([place])   . Adjustment disorder with mixed anxiety and depressed mood 06/24/2016  . Essential hypertension 01/07/2016  . Coronary atherosclerosis 11/07/2015  . GERD 07/09/2007   Past Medical History:  Diagnosis Date  . Allergic rhinitis   . Anxiety   . Cataract 2015   bilateral  . Diabetes mellitus with complication ([place])   . Diverticulosis   . Esophageal reflux   . Essential hypertension 01/07/2016  . Fatty liver 2018  . Fibula fracture 02/2005  . Former smoker   . Gastritis 2003  . Heart murmur   . Hemorrhoids   . History of nephrolithiasis   . Hyperglycemia   . Hyperlipemia   . Osteoarthritis   . Personal history of colonic polyps 1998   hyperplastic  . RA (rheumatoid arthritis) ([place])   .  Shingles 2012  . Uterine fibroid    Past Surgical History:  Procedure Laterality Date  . CHOLECYSTECTOMY    . COLONOSCOPY  11/08   internal hemorrhoids  . EP procedure  1994   SVT; normal echo  . ESOPHAGOGASTRODUODENOSCOPY  11/03   reactive gastropathy  . LIPOMA EXCISION  11/2002  . SVT s/p surgery--? ablation    . UPPER GASTROINTESTINAL ENDOSCOPY    . vaginal polyp excised  12/2000   benign   Social History   Tobacco Use  . Smoking status: Former Smoker    Quit date: 10/21/2003    Years since quitting: 16.2  . Smokeless tobacco: Never Used  Substance Use Topics  . Alcohol use: No    Alcohol/week: 0.0 standard drinks  . Drug use: No   Family History  Problem Relation Age of Onset  . Colon cancer Father 31  .  Diabetes Mother   . Heart disease Mother   . Kidney disease Mother        ESRD  . Stroke Mother   . Hypertension Brother   . Breast cancer Sister   . Diabetes Other        nephews  . Colon cancer Sister   . Esophageal cancer Neg Hx   . Stomach cancer Neg Hx   . Rectal cancer Neg Hx    Allergies  Allergen Reactions  . Bee Venom Anaphylaxis  . Other Anaphylaxis  . Hctz [Hydrochlorothiazide] Other (See Comments)    Felt weak all over  Felt weak all over   . Zyrtec Allergy [Cetirizine Hcl]     Doesn't help   Current Outpatient Medications on File Prior to Visit  Medication Sig Dispense Refill  . acetaminophen (TYLENOL) 325 MG tablet Take 650 mg by mouth every 6 (six) hours as needed.    Marland Kitchen albuterol (PROVENTIL HFA;VENTOLIN HFA) 108 (90 Base) MCG/ACT inhaler Inhale 2 puffs into the lungs every 4 (four) hours as needed for wheezing or shortness of breath. Please give spacer if possible 1 Inhaler 3  . amLODipine (NORVASC) 5 MG tablet TAKE 1 TABLET BY MOUTH EVERY DAY 90 tablet 0  . atorvastatin (LIPITOR) 10 MG tablet Take 0.5 tablets (5 mg total) by mouth every other day. 23 tablet 3  . Calcium & Magnesium Carbonates (MYLANTA PO) Take 1 capsule by mouth as directed.      . cyclobenzaprine (FLEXERIL) 5 MG tablet     . dicyclomine (BENTYL) 20 MG tablet Take 1 tablet (20 mg total) by mouth 4 (four) times daily -  before meals and at bedtime. 120 tablet 5  . docusate sodium (COLACE) 100 MG capsule Take 1 capsule (100 mg total) by mouth 2 (two) times daily. 60 capsule 0  . EPINEPHrine (EPIPEN IJ) Inject as directed as needed. Reported on 12/05/2015    . feeding supplement (BOOST / RESOURCE BREEZE) LIQD Take 1 Container by mouth 3 (three) times daily between meals. 90 Container 0  . fluticasone (FLONASE) 50 MCG/ACT nasal spray PLACE 2 SPRAYS INTO THE NOSE DAILY. 48 g 3  . folic acid (FOLVITE) 1 MG tablet Take 1 mg by mouth daily.    . hydrocortisone (ANUSOL-HC) 25 MG suppository Place 1  suppository (25 mg total) rectally 2 (two) times daily. For 7 days 14 suppository 0  . loratadine (CLARITIN) 10 MG tablet Take 10 mg by mouth daily.    . methotrexate (RHEUMATREX) 2.5 MG tablet Take 15 mg by mouth once a week. Caution:Chemotherapy. Protect from light. (  Monday of each week )    . pantoprazole (PROTONIX) 40 MG tablet Take 40 mg by mouth daily.  2  . polyethylene glycol (MIRALAX / GLYCOLAX) packet Take 17 g by mouth daily.    Marland Kitchen terconazole (TERAZOL 3) 0.8 % vaginal cream Place 1 applicator vaginally at bedtime. 20 g 0   Current Facility-Administered Medications on File Prior to Visit  Medication Dose Route Frequency Provider Last Rate Last Admin  . lidocaine (PF) (XYLOCAINE) 1 % injection 0.3 mL  0.3 mL Other Once Magnus Sinning, MD        Review of Systems  Constitutional: Positive for fatigue. Negative for activity change, appetite change, fever and unexpected weight change.  HENT: Negative for congestion, ear pain, rhinorrhea, sinus pressure and sore throat.   Eyes: Negative for pain, redness and visual disturbance.  Respiratory: Negative for cough, shortness of breath and wheezing.   Cardiovascular: Negative for chest pain and palpitations.  Gastrointestinal: Negative for abdominal pain, blood in stool, constipation and diarrhea.  Endocrine: Negative for polydipsia and polyuria.  Genitourinary: Negative for dysuria, frequency and urgency.  Musculoskeletal: Positive for arthralgias, gait problem, joint swelling and myalgias. Negative for back pain.  Skin: Negative for pallor and rash.  Allergic/Immunologic: Negative for environmental allergies.  Neurological: Negative for dizziness, syncope, light-headedness and headaches.  Hematological: Negative for adenopathy. Does not bruise/bleed easily.  Psychiatric/Behavioral: Negative for decreased concentration and dysphoric mood. The patient is not nervous/anxious.        Occ frustration Does not think she is clinically  depressed but has down days        Objective:   Physical Exam Constitutional:      General: She is not in acute distress.    Appearance: Normal appearance. She is well-developed.     Comments: Overweight   HENT:     Head: Normocephalic and atraumatic.     Mouth/Throat:     Mouth: Mucous membranes are moist.  Eyes:     General: No scleral icterus.    Conjunctiva/sclera: Conjunctivae normal.     Pupils: Pupils are equal, round, and reactive to light.  Neck:     Thyroid: No thyromegaly.     Vascular: No carotid bruit or JVD.  Cardiovascular:     Rate and Rhythm: Normal rate and regular rhythm.     Heart sounds: Normal heart sounds. No gallop.   Pulmonary:     Effort: Pulmonary effort is normal. No respiratory distress.     Breath sounds: Normal breath sounds. No wheezing or rales.  Abdominal:     General: Bowel sounds are normal. There is no distension or abdominal bruit.     Palpations: Abdomen is soft. There is no mass.     Tenderness: There is no abdominal tenderness.  Musculoskeletal:     Cervical back: Normal range of motion and neck supple. No tenderness.     Right lower leg: No edema.     Left lower leg: No edema.     Comments: Some deformities (hands) of OA and tender joints  Lymphadenopathy:     Cervical: No cervical adenopathy.  Skin:    General: Skin is warm and dry.     Findings: No rash.  Neurological:     Mental Status: She is alert. Mental status is at baseline.     Sensory: No sensory deficit.     Coordination: Coordination normal.     Deep Tendon Reflexes: Reflexes are normal and symmetric. Reflexes normal.  Psychiatric:  Mood and Affect: Mood normal.     Comments: Pleasant  Discusses stressors candidly  attentive           Assessment & Plan:   Problem List Items Addressed This Visit      Cardiovascular and Mediastinum   Aortic atherosclerosis ([place])    No clinical changes  Controlling bp/cholesterol/glucose        Endocrine    Controlled type 2 diabetes mellitus without complication, without long-term current use of insulin ([facility])    A1C today  Pt does not remember being diabetic  Disc imp of low glycemic diet  Has lost wt intentionally  - commended      Relevant Orders   Hemoglobin A1c     Musculoskeletal and Integument   RA (rheumatoid arthritis) ([facility])    Pt still voices pain all over-but today is able to walk with a cane  Pain interferes with sleep, mood, energy level  Taking mtx oral- doubts ins will cover the inj Enc pt strongly to discuss pain control and further tx with her rheumatologist Dr Amil Amen         Other   Fatigue - Primary    Chronic and multifactorial Chronic pain interfering with sleep and mobility adds to this (she will d/w her rheumatologist) Encouraged exercise as tolerated and healthy diet  inst to add daily vitamin D  Urged to watch out for s/s of OSA I do not think there is a magic supplement for energy  Labs done today incl tsh and cbc and A1c        Relevant Orders   CBC with Differential/Platelet   TSH   Basic metabolic panel

## 2020-01-30 NOTE — Assessment & Plan Note (Addendum)
Chronic and multifactorial Chronic pain interfering with sleep and mobility adds to this (she will d/w her rheumatologist) Encouraged exercise as tolerated and healthy diet  inst to add daily vitamin D  Urged to watch out for s/s of OSA I do not think there is a magic supplement for energy  Labs done today incl tsh and cbc and A1c

## 2020-01-30 NOTE — Patient Instructions (Signed)
[name] , Thank you for taking time to come for your Medicare Wellness Visit. I appreciate your ongoing commitment to your health goals. Please review the following plan we discussed and let me know if I can assist you in the future.   Screening recommendations/referrals: Colonoscopy: Up to date, completed 04/10/2016 Mammogram: declined due to COVID Bone Density: completed 10/16/2016 Recommended yearly ophthalmology/optometry visit for glaucoma screening and checkup Recommended yearly dental visit for hygiene and checkup  Vaccinations: Influenza vaccine: Fall 2021 Pneumococcal vaccine: Completed series Tdap vaccine: decline Shingles vaccine: discussed    Advanced directives: Please bring a copy of your POA (Power of Attorney) and/or Living Will to your next appointment.   Conditions/risks identified: diabetes, hypertension, hyperlipidemia  Next appointment: none   Preventive Care 78 Years and Older, Female Preventive care refers to lifestyle choices and visits with your health care provider that can promote health and wellness. What does preventive care include?  A yearly physical exam. This is also called an annual well check.  Dental exams once or twice a year.  Routine eye exams. Ask your health care provider how often you should have your eyes checked.  Personal lifestyle choices, including:  Daily care of your teeth and gums.  Regular physical activity.  Eating a healthy diet.  Avoiding tobacco and drug use.  Limiting alcohol use.  Practicing safe sex.  Taking low-dose aspirin every day.  Taking vitamin and mineral supplements as recommended by your health care provider. What happens during an annual well check? The services and screenings done by your health care provider during your annual well check will depend on your age, overall health, lifestyle risk factors, and family history of disease. Counseling  Your health care provider may ask you questions  about your:  Alcohol use.  Tobacco use.  Drug use.  Emotional well-being.  Home and relationship well-being.  Sexual activity.  Eating habits.  History of falls.  Memory and ability to understand (cognition).  Work and work Statistician.  Reproductive health. Screening  You may have the following tests or measurements:  Height, weight, and BMI.  Blood pressure.  Lipid and cholesterol levels. These may be checked every 5 years, or more frequently if you are over 75 years old.  Skin check.  Lung cancer screening. You may have this screening every year starting at age 86 if you have a 30-pack-year history of smoking and currently smoke or have quit within the past 15 years.  Fecal occult blood test (FOBT) of the stool. You may have this test every year starting at age 58.  Flexible sigmoidoscopy or colonoscopy. You may have a sigmoidoscopy every 5 years or a colonoscopy every 10 years starting at age 55.  Hepatitis C blood test.  Hepatitis B blood test.  Sexually transmitted disease (STD) testing.  Diabetes screening. This is done by checking your blood sugar (glucose) after you have not eaten for a while (fasting). You may have this done every 1-3 years.  Bone density scan. This is done to screen for osteoporosis. You may have this done starting at age 47.  Mammogram. This may be done every 1-2 years. Talk to your health care provider about how often you should have regular mammograms. Talk with your health care provider about your test results, treatment options, and if necessary, the need for more tests. Vaccines  Your health care provider may recommend certain vaccines, such as:  Influenza vaccine. This is recommended every year.  Tetanus, diphtheria, and acellular pertussis (  Tdap, Td) vaccine. You may need a Td booster every 10 years.  Zoster vaccine. You may need this after age 33.  Pneumococcal 13-valent conjugate (PCV13) vaccine. One dose is  recommended after age 31.  Pneumococcal polysaccharide (PPSV23) vaccine. One dose is recommended after age 63. Talk to your health care provider about which screenings and vaccines you need and how often you need them. This information is not intended to replace advice given to you by your health care provider. Make sure you discuss any questions you have with your health care provider. Document Released: 11/02/2015 Document Revised: 06/25/2016 Document Reviewed: 08/07/2015 Elsevier Interactive Patient Education  2017 [place] Prevention in the Home Falls can cause injuries. They can happen to people of all ages. There are many things you can do to make your home safe and to help prevent falls. What can I do on the outside of my home?  Regularly fix the edges of walkways and driveways and fix any cracks.  Remove anything that might make you trip as you walk through a door, such as a raised step or threshold.  Trim any bushes or trees on the path to your home.  Use bright outdoor lighting.  Clear any walking paths of anything that might make someone trip, such as rocks or tools.  Regularly check to see if handrails are loose or broken. Make sure that both sides of any steps have handrails.  Any raised decks and porches should have guardrails on the edges.  Have any leaves, snow, or ice cleared regularly.  Use sand or salt on walking paths during winter.  Clean up any spills in your garage right away. This includes oil or grease spills. What can I do in the bathroom?  Use night lights.  Install grab bars by the toilet and in the tub and shower. Do not use towel bars as grab bars.  Use non-skid mats or decals in the tub or shower.  If you need to sit down in the shower, use a plastic, non-slip stool.  Keep the floor dry. Clean up any water that spills on the floor as soon as it happens.  Remove soap buildup in the tub or shower regularly.  Attach bath mats  securely with double-sided non-slip rug tape.  Do not have throw rugs and other things on the floor that can make you trip. What can I do in the bedroom?  Use night lights.  Make sure that you have a light by your bed that is easy to reach.  Do not use any sheets or blankets that are too big for your bed. They should not hang down onto the floor.  Have a firm chair that has side arms. You can use this for support while you get dressed.  Do not have throw rugs and other things on the floor that can make you trip. What can I do in the kitchen?  Clean up any spills right away.  Avoid walking on wet floors.  Keep items that you use a lot in easy-to-reach places.  If you need to reach something above you, use a strong step stool that has a grab bar.  Keep electrical cords out of the way.  Do not use floor polish or wax that makes floors slippery. If you must use wax, use non-skid floor wax.  Do not have throw rugs and other things on the floor that can make you trip. What can I do with my stairs?  Do  not leave any items on the stairs.  Make sure that there are handrails on both sides of the stairs and use them. Fix handrails that are broken or loose. Make sure that handrails are as long as the stairways.  Check any carpeting to make sure that it is firmly attached to the stairs. Fix any carpet that is loose or worn.  Avoid having throw rugs at the top or bottom of the stairs. If you do have throw rugs, attach them to the floor with carpet tape.  Make sure that you have a light switch at the top of the stairs and the bottom of the stairs. If you do not have them, ask someone to add them for you. What else can I do to help prevent falls?  Wear shoes that:  Do not have high heels.  Have rubber bottoms.  Are comfortable and fit you well.  Are closed at the toe. Do not wear sandals.  If you use a stepladder:  Make sure that it is fully opened. Do not climb a closed  stepladder.  Make sure that both sides of the stepladder are locked into place.  Ask someone to hold it for you, if possible.  Clearly mark and make sure that you can see:  Any grab bars or handrails.  First and last steps.  Where the edge of each step is.  Use tools that help you move around (mobility aids) if they are needed. These include:  Canes.  Walkers.  Scooters.  Crutches.  Turn on the lights when you go into a dark area. Replace any light bulbs as soon as they burn out.  Set up your furniture so you have a clear path. Avoid moving your furniture around.  If any of your floors are uneven, fix them.  If there are any pets around you, be aware of where they are.  Review your medicines with your doctor. Some medicines can make you feel dizzy. This can increase your chance of falling. Ask your doctor what other things that you can do to help prevent falls. This information is not intended to replace advice given to you by your health care provider. Make sure you discuss any questions you have with your health care provider. Document Released: 08/02/2009 Document Revised: 03/13/2016 Document Reviewed: 11/10/2014 Elsevier Interactive Patient Education  2017 [employer].

## 2020-01-30 NOTE — Progress Notes (Signed)
Subjective:   [name] is a 79 y.o. female who presents for Medicare Annual (Subsequent) preventive examination.  Review of Systems: N/A   This visit is being conducted through telemedicine via telephone at the nurse health advisor's home address due to the COVID-19 pandemic. This patient has given me verbal consent via doximity to conduct this visit, patient states they are participating from their home address. Patient and myself are on the telephone call. There is no referral for this visit. Some vital signs may be absent or patient reported.    Patient identification: identified by name, DOB, and current address   Cardiac Risk Factors include: advanced age (>11men, >20 women);diabetes mellitus;hypertension;dyslipidemia     Objective:     Vitals: BP 132/84   Temp (!) 96.9 F (36.1 C)   Ht 5' 6.75" (1.695 m)   Wt 189 lb (85.7 kg)   BMI 29.82 kg/m   Body mass index is 29.82 kg/m.  Advanced Directives 01/30/2020 10/23/2017 01/21/2017 01/21/2017 01/20/2017 01/03/2016  Does Patient Have a Medical Advance Directive? Yes No No No No No  Type of Paramedic of [place];Living will - - - - -  Copy of [place] in Chart? No - copy requested - - - - -  Would patient like information on creating a medical advance directive? - Yes (MAU/Ambulatory/Procedural Areas - Information given) No - Patient declined - - Yes - Educational materials given    Tobacco Social History   Tobacco Use  Smoking Status Former Smoker  . Quit date: 10/21/2003  . Years since quitting: 16.2  Smokeless Tobacco Never Used     Counseling given: Not Answered   Clinical Intake:  Pre-visit preparation completed: Yes  Pain : 0-10 Pain Score: 10-Worst pain ever Pain Type: Chronic pain Pain Location: (all over body) Pain Descriptors / Indicators: Aching Pain Onset: More than a month ago Pain Frequency: Intermittent     Nutritional Risks: None Diabetes: Yes CBG  done?: No Did pt. bring in CBG monitor from home?: No  How often do you need to have someone help you when you read instructions, pamphlets, or other written materials from your doctor or pharmacy?: 1 - Never What is the last grade level you completed in school?: 12th  Interpreter Needed?: No  Information entered by :: CJohnson, LPN  Past Medical History:  Diagnosis Date  . Allergic rhinitis   . Anxiety   . Cataract 2015   bilateral  . Diabetes mellitus with complication ([place])   . Diverticulosis   . Esophageal reflux   . Essential hypertension 01/07/2016  . Fatty liver 2018  . Fibula fracture 02/2005  . Former smoker   . Gastritis 2003  . Heart murmur   . Hemorrhoids   . History of nephrolithiasis   . Hyperglycemia   . Hyperlipemia   . Osteoarthritis   . Personal history of colonic polyps 1998   hyperplastic  . RA (rheumatoid arthritis) ([place])   . Shingles 2012  . Uterine fibroid    Past Surgical History:  Procedure Laterality Date  . CHOLECYSTECTOMY    . COLONOSCOPY  11/08   internal hemorrhoids  . EP procedure  1994   SVT; normal echo  . ESOPHAGOGASTRODUODENOSCOPY  11/03   reactive gastropathy  . LIPOMA EXCISION  11/2002  . SVT s/p surgery--? ablation    . UPPER GASTROINTESTINAL ENDOSCOPY    . vaginal polyp excised  12/2000   benign   Family History  Problem Relation Age of Onset  . Colon cancer Father 37  . Diabetes Mother   . Heart disease Mother   . Kidney disease Mother        ESRD  . Stroke Mother   . Hypertension Brother   . Breast cancer Sister   . Diabetes Other        nephews  . Colon cancer Sister   . Esophageal cancer Neg Hx   . Stomach cancer Neg Hx   . Rectal cancer Neg Hx    Social History   Socioeconomic History  . Marital status: Married    Spouse name: Not on file  . Number of children: 3  . Years of education: Not on file  . Highest education level: Not on file  Occupational History  . Occupation: retired    Fish farm manager:  RETIRED  Tobacco Use  . Smoking status: Former Smoker    Quit date: 10/21/2003    Years since quitting: 16.2  . Smokeless tobacco: Never Used  Substance and Sexual Activity  . Alcohol use: No    Alcohol/week: 0.0 standard drinks  . Drug use: No  . Sexual activity: Yes  Other Topics Concern  . Not on file  Social History Narrative   Married      Retired      Occasional caffeine      No regular exercise   Social Determinants of Radio broadcast assistant Strain: Low Risk   . Difficulty of Paying Living Expenses: Not hard at all  Food Insecurity: No Food Insecurity  . Worried About Charity fundraiser in the Last Year: Never true  . Ran Out of Food in the Last Year: Never true  Transportation Needs: No Transportation Needs  . Lack of Transportation (Medical): No  . Lack of Transportation (Non-Medical): No  Physical Activity: Inactive  . Days of Exercise per Week: 0 days  . Minutes of Exercise per Session: 0 min  Stress: No Stress Concern Present  . Feeling of Stress : Not at all  Social Connections:   . Frequency of Communication with Friends and Family:   . Frequency of Social Gatherings with Friends and Family:   . Attends Religious Services:   . Active Member of Clubs or Organizations:   . Attends Archivist Meetings:   Marland Kitchen Marital Status:     Outpatient Encounter Medications as of 01/30/2020  Medication Sig  . acetaminophen (TYLENOL) 325 MG tablet Take 650 mg by mouth every 6 (six) hours as needed.  Marland Kitchen albuterol (PROVENTIL HFA;VENTOLIN HFA) 108 (90 Base) MCG/ACT inhaler Inhale 2 puffs into the lungs every 4 (four) hours as needed for wheezing or shortness of breath. Please give spacer if possible  . amLODipine (NORVASC) 5 MG tablet TAKE 1 TABLET BY MOUTH EVERY DAY  . atorvastatin (LIPITOR) 10 MG tablet Take 0.5 tablets (5 mg total) by mouth every other day.  . Calcium & Magnesium Carbonates (MYLANTA PO) Take 1 capsule by mouth as directed.    .  cyclobenzaprine (FLEXERIL) 5 MG tablet   . dicyclomine (BENTYL) 20 MG tablet Take 1 tablet (20 mg total) by mouth 4 (four) times daily -  before meals and at bedtime.  . docusate sodium (COLACE) 100 MG capsule Take 1 capsule (100 mg total) by mouth 2 (two) times daily.  Marland Kitchen EPINEPHrine (EPIPEN IJ) Inject as directed as needed. Reported on 12/05/2015  . feeding supplement (BOOST / RESOURCE BREEZE) LIQD Take 1 Container by mouth  3 (three) times daily between meals.  . fluticasone (FLONASE) 50 MCG/ACT nasal spray PLACE 2 SPRAYS INTO THE NOSE DAILY.  . folic acid (FOLVITE) 1 MG tablet Take 1 mg by mouth daily.  . hydrocortisone (ANUSOL-HC) 25 MG suppository Place 1 suppository (25 mg total) rectally 2 (two) times daily. For 7 days  . loratadine (CLARITIN) 10 MG tablet Take 10 mg by mouth daily.  . methotrexate (RHEUMATREX) 2.5 MG tablet Take 15 mg by mouth once a week. Caution:Chemotherapy. Protect from light. ( Monday of each week )  . pantoprazole (PROTONIX) 40 MG tablet Take 40 mg by mouth daily.  . polyethylene glycol (MIRALAX / GLYCOLAX) packet Take 17 g by mouth daily.  Marland Kitchen terconazole (TERAZOL 3) 0.8 % vaginal cream Place 1 applicator vaginally at bedtime.   Facility-Administered Encounter Medications as of 01/30/2020  Medication  . lidocaine (PF) (XYLOCAINE) 1 % injection 0.3 mL    Activities of Daily Living In your present state of health, do you have any difficulty performing the following activities: 01/30/2020  Hearing? N  Vision? N  Difficulty concentrating or making decisions? N  Walking or climbing stairs? N  Dressing or bathing? N  Doing errands, shopping? N  Preparing Food and eating ? N  Using the Toilet? N  In the past six months, have you accidently leaked urine? N  Do you have problems with loss of bowel control? N  Managing your Medications? N  Managing your Finances? N  Housekeeping or managing your Housekeeping? N  Some recent data might be hidden    Patient Care  Team: Tower, Wynelle Fanny, MD as PCP - General Fay Records, MD as Consulting Physician (Cardiology) Ladene Artist, MD as Consulting Physician (Gastroenterology) Lorelee Cover., MD as Consulting Physician (Ophthalmology)    Assessment:   This is a routine wellness examination for [place].  Exercise Activities and Dietary recommendations Current Exercise Habits: The patient does not participate in regular exercise at present, Exercise limited by: None identified  Goals    . Increase physical activity     Starting 10/23/2017, I will continue to walk for 10 minutes daily.     . Patient Stated     01/30/2020, I will maintain and continue medications as prescribed.        Fall Risk Fall Risk  01/30/2020 10/29/2018 10/23/2017 03/31/2017 01/03/2016  Falls in the past year? 0 0 No No No  Number falls in past yr: 0 0 - - -  Injury with Fall? 0 - - - -  Risk for fall due to : Medication side effect - - - -  Follow up Falls evaluation completed;Falls prevention discussed - - - -   Is the patient's home free of loose throw rugs in walkways, pet beds, electrical cords, etc?   yes      Grab bars in the bathroom? no      Handrails on the stairs?   yes      Adequate lighting?   yes  Timed Get Up and Go performed: N/A  Depression Screen PHQ 2/9 Scores 01/30/2020 01/30/2020 10/29/2018 10/23/2017  PHQ - 2 Score 0 1 0 0  PHQ- 9 Score 0 - - 2     Cognitive Function MMSE - Mini Mental State Exam 01/30/2020 10/23/2017 01/03/2016  Orientation to time 5 5 5   Orientation to Place 5 5 5   Registration 3 3 3   Attention/ Calculation 5 0 5  Recall 3 3 3   Language- name 2  objects - 0 0  Language- repeat 1 1 1   Language- follow 3 step command - 3 3  Language- read & follow direction - 0 1  Write a sentence - 0 0  Copy design - 0 0  Total score - 20 26        Immunization History  Administered Date(s) Administered  . Influenza,inj,Quad PF,6+ Mos 08/10/2015, 08/28/2016, 10/27/2017, 07/27/2018  . PFIZER  SARS-COV-2 Vaccination 12/18/2019, 01/17/2020  . Pneumococcal Conjugate-13 01/03/2016  . Pneumococcal Polysaccharide-23 04/24/2014  . Td 09/04/2006    Qualifies for Shingles Vaccine: yes  Screening Tests Health Maintenance  Topic Date Due  . HEMOGLOBIN A1C  10/29/2019  . FOOT EXAM  10/30/2019  . URINE MICROALBUMIN  10/30/2019  . OPHTHALMOLOGY EXAM  05/05/2020 (Originally 05/04/1951)  . MAMMOGRAM  05/07/2020 (Originally 11/18/2018)  . TETANUS/TDAP  05/07/2029 (Originally 09/04/2016)  . INFLUENZA VACCINE  05/20/2020  . DEXA SCAN  Completed  . PNA vac Low Risk Adult  Completed    Cancer Screenings: Lung: Low Dose CT Chest recommended if Age 2-80 years, 30 pack-year currently smoking OR have quit w/in 15 years. Patient does not qualify. Breast:  Up to date on Mammogram? No, will wait due to COVID   Bone Density/Dexa: completed 10/16/2016 Colorectal: completed 04/10/2016  Additional Screenings:  Hepatitis C Screening: N/A     Plan:   Patient will maintain and continue medications as prescribed.   I have personally reviewed and noted the following in the patient's chart:   . Medical and social history . Use of alcohol, tobacco or illicit drugs  . Current medications and supplements . Functional ability and status . Nutritional status . Physical activity . Advanced directives . List of other physicians . Hospitalizations, surgeries, and ER visits in previous 12 months . Vitals . Screenings to include cognitive, depression, and falls . Referrals and appointments  In addition, I have reviewed and discussed with patient certain preventive protocols, quality metrics, and best practice recommendations. A written personalized care plan for preventive services as well as general preventive health recommendations were provided to patient.     Andrez Grime, LPN  [id number]

## 2020-01-30 NOTE — Progress Notes (Signed)
PCP notes:  Health Maintenance: Mammogram- decline due to [place] exam- decline due to COVID   Abnormal Screenings: none   Patient concerns: none   Nurse concerns: none   Next PCP appt.: none

## 2020-01-30 NOTE — Patient Instructions (Addendum)
For your mild diabetes Try to get most of your carbohydrates from produce (with the exception of white potatoes)  Eat less bread/pasta/rice/snack foods/cereals/sweets and other items from the middle of the grocery store (processed carbs)   Vitamin D helps bone health and general well being  At least 1000 iu daily   You are doing well with weight loss-keep it up   I think your fatigue comes from multiple things Chronic pain  Inability to exercise as much as you would like to  Poor sleep from your joint pain  A little age related Possibly borderline depressed    We will do some labs today

## 2020-01-30 NOTE — Assessment & Plan Note (Signed)
No clinical changes  Controlling bp/cholesterol/glucose

## 2020-01-31 ENCOUNTER — Telehealth: Payer: Self-pay | Admitting: *Deleted

## 2020-01-31 NOTE — Telephone Encounter (Signed)
Called pt regarding lab results and no answer and no VM (kept ringing)

## 2020-02-01 ENCOUNTER — Encounter: Payer: Self-pay | Admitting: *Deleted

## 2020-02-09 ENCOUNTER — Telehealth: Payer: Self-pay | Admitting: Family Medicine

## 2020-02-09 NOTE — Telephone Encounter (Signed)
Patient called today She stated that she received the lab results in the mail. She would like a call back for someone to explain the labs to her and what they mean  Please advise

## 2020-02-09 NOTE — Telephone Encounter (Signed)
Reviewed labs with pt and answered all of her questions

## 2020-03-13 DIAGNOSIS — M150 Primary generalized (osteo)arthritis: Secondary | ICD-10-CM | POA: Diagnosis not present

## 2020-03-13 DIAGNOSIS — M0579 Rheumatoid arthritis with rheumatoid factor of multiple sites without organ or systems involvement: Secondary | ICD-10-CM | POA: Diagnosis not present

## 2020-04-03 ENCOUNTER — Other Ambulatory Visit: Payer: Self-pay

## 2020-04-03 ENCOUNTER — Encounter: Payer: Self-pay | Admitting: Family Medicine

## 2020-04-03 ENCOUNTER — Ambulatory Visit: Payer: Medicare Other | Admitting: Family Medicine

## 2020-04-03 ENCOUNTER — Ambulatory Visit (INDEPENDENT_AMBULATORY_CARE_PROVIDER_SITE_OTHER): Payer: Medicare Other | Admitting: Family Medicine

## 2020-04-03 VITALS — BP 148/76 | HR 92 | Temp 96.9°F | Ht 66.75 in | Wt 191.1 lb

## 2020-04-03 DIAGNOSIS — R1030 Lower abdominal pain, unspecified: Secondary | ICD-10-CM | POA: Diagnosis not present

## 2020-04-03 DIAGNOSIS — N939 Abnormal uterine and vaginal bleeding, unspecified: Secondary | ICD-10-CM | POA: Diagnosis not present

## 2020-04-03 DIAGNOSIS — R8290 Unspecified abnormal findings in urine: Secondary | ICD-10-CM

## 2020-04-03 LAB — POC URINALSYSI DIPSTICK (AUTOMATED)
Bilirubin, UA: 1
Blood, UA: NEGATIVE
Glucose, UA: NEGATIVE
Ketones, UA: 5
Nitrite, UA: NEGATIVE
Protein, UA: POSITIVE — AB
Spec Grav, UA: >=1.03 — AB (ref 1.010–1.025)
Urobilinogen, UA: 0.2 E.U./dL
pH, UA: 6 (ref 5.0–8.0)

## 2020-04-03 NOTE — Assessment & Plan Note (Signed)
With low abd pain  Also pain on bimanual exam  Concentrated urine with protein and sm leuk Pend cx Enc strongly to inc her fluids

## 2020-04-03 NOTE — Patient Instructions (Addendum)
Call your rheumatologist about the rash -Dr Amil Amen  I will send your urine for a culture  Drink a lot of fluids -your urine indicates you are dehydrated   I do not see any acute vaginal bleeding right now but we will likely have you see a gyn doctor  Keep watching for bleeding   If abdominal pain becomes unbearable -go to the ER

## 2020-04-03 NOTE — Progress Notes (Signed)
Subjective:    Patient ID: [name], female    DOB: 1941/07/02, 79 y.o.   MRN: [id number]  This visit occurred during the SARS-CoV-2 public health emergency.  Safety protocols were in place, including screening questions prior to the visit, additional usage of staff PPE, and extensive cleaning of exam room while observing appropriate contact time as indicated for disinfecting solutions.    HPI Pt presents with c/o lower abdominal pain   Wt Readings from Last 3 Encounters:  04/03/20 191 lb 1 oz (86.7 kg)  01/30/20 189 lb (85.7 kg)  01/30/20 189 lb 3 oz (85.8 kg)   30.15 kg/m   Is covid immunized   Also rash - ? From humira on legs  Also some mild vaginal bleeding  She saw spots on pad   Abdominal pain /low  Worse than in the past- had to lie down  Off and on  No gas or diarrhea or constipation  No blood in stool   No urinary symptoms    In care everywhere- call noted yesterday to GI at [facility] Noted abd pain for a week with some mild vaginal bleeding   She has a h/o GI problems and has seen GI here and at [facility]  Colonoscopy 6/17 showed mild diverticulosis  EGD at same time was negative /small HH CT angio of abd/pelvis nl 9/18  Has had ccy  Also vaginal polyp excised in 2002   Currently taking humira and mtx for her RA   Ua: Results for orders placed or performed in visit on 04/03/20  POCT Urinalysis Dipstick (Automated)  Result Value Ref Range   Color, UA Dark Yellow    Clarity, UA Cloudy    Glucose, UA Negative Negative   Bilirubin, UA 1 mg/dL    Ketones, UA 5 mg/dL    Spec Grav, UA >=1.030 (A) 1.010 - 1.025   Blood, UA Negative    pH, UA 6.0 5.0 - 8.0   Protein, UA Positive (A) Negative   Urobilinogen, UA 0.2 0.2 or 1.0 E.U./dL   Nitrite, UA Negative    Leukocytes, UA Small (1+) (A) Negative    Eating does worsen symptoms   Patient Active Problem List   Diagnosis Date Noted  . Lower abdominal pain 04/03/2020  . Abnormal urinalysis 04/03/2020  .  Vaginal bleeding 04/03/2020  . Fatigue 01/30/2020  . TMJ (dislocation of temporomandibular joint) 04/29/2019  . Encounter for screening mammogram for breast cancer 10/27/2017  . Routine general medical examination at a health care facility 10/27/2017  . Aortic atherosclerosis ([place]) 02/03/2017  . RA (rheumatoid arthritis) ([place])   . Controlled type 2 diabetes mellitus without complication, without long-term current use of insulin ([place])   . Adjustment disorder with mixed anxiety and depressed mood 06/24/2016  . Essential hypertension 01/07/2016  . Coronary atherosclerosis 11/07/2015  . GERD 07/09/2007   Past Medical History:  Diagnosis Date  . Allergic rhinitis   . Anxiety   . Cataract 2015   bilateral  . Diabetes mellitus with complication ([place])   . Diverticulosis   . Esophageal reflux   . Essential hypertension 01/07/2016  . Fatty liver 2018  . Fibula fracture 02/2005  . Former smoker   . Gastritis 2003  . Heart murmur   . Hemorrhoids   . History of nephrolithiasis   . Hyperglycemia   . Hyperlipemia   . Osteoarthritis   . Personal history of colonic polyps 1998   hyperplastic  . RA (rheumatoid arthritis) ([place])   .  Shingles 2012  . Uterine fibroid    Past Surgical History:  Procedure Laterality Date  . CHOLECYSTECTOMY    . COLONOSCOPY  11/08   internal hemorrhoids  . EP procedure  1994   SVT; normal echo  . ESOPHAGOGASTRODUODENOSCOPY  11/03   reactive gastropathy  . LIPOMA EXCISION  11/2002  . SVT s/p surgery--? ablation    . UPPER GASTROINTESTINAL ENDOSCOPY    . vaginal polyp excised  12/2000   benign   Social History   Tobacco Use  . Smoking status: Former Smoker    Quit date: 10/21/2003    Years since quitting: 16.4  . Smokeless tobacco: Never Used  Vaping Use  . Vaping Use: Never used  Substance Use Topics  . Alcohol use: No    Alcohol/week: 0.0 standard drinks  . Drug use: No   Family History  Problem Relation Age of Onset  . Colon cancer Father 34    . Diabetes Mother   . Heart disease Mother   . Kidney disease Mother        ESRD  . Stroke Mother   . Hypertension Brother   . Breast cancer Sister   . Diabetes Other        nephews  . Colon cancer Sister   . Esophageal cancer Neg Hx   . Stomach cancer Neg Hx   . Rectal cancer Neg Hx    Allergies  Allergen Reactions  . Bee Venom Anaphylaxis  . Other Anaphylaxis  . Hctz [Hydrochlorothiazide] Other (See Comments)    Felt weak all over  Felt weak all over   . Zyrtec Allergy [Cetirizine Hcl]     Doesn't help   Current Outpatient Medications on File Prior to Visit  Medication Sig Dispense Refill  . acetaminophen (TYLENOL) 325 MG tablet Take 650 mg by mouth every 6 (six) hours as needed.    Marland Kitchen albuterol (PROVENTIL HFA;VENTOLIN HFA) 108 (90 Base) MCG/ACT inhaler Inhale 2 puffs into the lungs every 4 (four) hours as needed for wheezing or shortness of breath. Please give spacer if possible 1 Inhaler 3  . amLODipine (NORVASC) 5 MG tablet TAKE 1 TABLET BY MOUTH EVERY DAY 90 tablet 0  . atorvastatin (LIPITOR) 10 MG tablet Take 0.5 tablets (5 mg total) by mouth every other day. 23 tablet 3  . Calcium & Magnesium Carbonates (MYLANTA PO) Take 1 capsule by mouth as directed.      . cyclobenzaprine (FLEXERIL) 5 MG tablet     . dicyclomine (BENTYL) 20 MG tablet Take 1 tablet (20 mg total) by mouth 4 (four) times daily -  before meals and at bedtime. 120 tablet 5  . docusate sodium (COLACE) 100 MG capsule Take 1 capsule (100 mg total) by mouth 2 (two) times daily. 60 capsule 0  . EPINEPHrine (EPIPEN IJ) Inject as directed as needed. Reported on 12/05/2015    . feeding supplement (BOOST / RESOURCE BREEZE) LIQD Take 1 Container by mouth 3 (three) times daily between meals. 90 Container 0  . fluticasone (FLONASE) 50 MCG/ACT nasal spray PLACE 2 SPRAYS INTO THE NOSE DAILY. 48 g 3  . folic acid (FOLVITE) 1 MG tablet Take 1 mg by mouth daily.    Marland Kitchen loratadine (CLARITIN) 10 MG tablet Take 10 mg by mouth  daily.    . methotrexate (RHEUMATREX) 2.5 MG tablet Take 15 mg by mouth once a week. Caution:Chemotherapy. Protect from light. ( Monday of each week )    . pantoprazole (PROTONIX) 40 MG  tablet Take 40 mg by mouth daily.  2  . polyethylene glycol (MIRALAX / GLYCOLAX) packet Take 17 g by mouth daily.    Marland Kitchen terconazole (TERAZOL 3) 0.8 % vaginal cream Place 1 applicator vaginally at bedtime. 20 g 0  . HUMIRA PEN 40 MG/0.4ML PNKT Inject 40 mg as directed every 14 (fourteen) days.    . predniSONE (DELTASONE) 5 MG tablet Take 5 mg by mouth daily.     Current Facility-Administered Medications on File Prior to Visit  Medication Dose Route Frequency Provider Last Rate Last Admin  . lidocaine (PF) (XYLOCAINE) 1 % injection 0.3 mL  0.3 mL Other Once Magnus Sinning, MD        Review of Systems  Constitutional: Negative for activity change, appetite change, fatigue, fever and unexpected weight change.  HENT: Negative for congestion, ear pain, rhinorrhea, sinus pressure and sore throat.   Eyes: Negative for pain, redness and visual disturbance.  Respiratory: Negative for cough, shortness of breath and wheezing.   Cardiovascular: Negative for chest pain and palpitations.  Gastrointestinal: Positive for abdominal pain. Negative for abdominal distention, anal bleeding, blood in stool, constipation, diarrhea, nausea, rectal pain and vomiting.  Endocrine: Negative for polydipsia and polyuria.  Genitourinary: Negative for dysuria, frequency and urgency.  Musculoskeletal: Positive for arthralgias. Negative for back pain and myalgias.  Skin: Positive for rash. Negative for pallor.  Allergic/Immunologic: Negative for environmental allergies.  Neurological: Negative for dizziness, syncope and headaches.  Hematological: Negative for adenopathy. Does not bruise/bleed easily.  Psychiatric/Behavioral: Negative for decreased concentration and dysphoric mood. The patient is not nervous/anxious.        Objective:    Physical Exam Constitutional:      General: She is not in acute distress.    Appearance: Normal appearance. She is well-developed. She is obese. She is not ill-appearing.  HENT:     Head: Normocephalic and atraumatic.     Mouth/Throat:     Mouth: Mucous membranes are moist.  Eyes:     General: No scleral icterus.    Conjunctiva/sclera: Conjunctivae normal.     Pupils: Pupils are equal, round, and reactive to light.  Cardiovascular:     Rate and Rhythm: Regular rhythm. Tachycardia present.     Heart sounds: Normal heart sounds.  Pulmonary:     Effort: Pulmonary effort is normal. No respiratory distress.     Breath sounds: Normal breath sounds. No wheezing or rales.  Abdominal:     General: Abdomen is protuberant. Bowel sounds are normal. There is no distension.     Palpations: Abdomen is soft. There is no shifting dullness, fluid wave, hepatomegaly, splenomegaly, mass or pulsatile mass.     Tenderness: There is abdominal tenderness in the right lower quadrant, epigastric area and left lower quadrant. There is no right CVA tenderness, left CVA tenderness, guarding or rebound. Negative signs include Murphy's sign and McBurney's sign.     Hernia: No hernia is present.     Comments: Tender mostly in low abdomen bilat  Mild in epigastric area  Musculoskeletal:     Cervical back: Normal range of motion and neck supple.  Lymphadenopathy:     Cervical: No cervical adenopathy.  Skin:    General: Skin is warm and dry.     Coloration: Skin is not pale.     Findings: No erythema.     Comments: Patchy hyperpigmentation on inner thighs -not raised   Neurological:     Mental Status: She is alert.  Coordination: Coordination normal.     Deep Tendon Reflexes: Reflexes normal.  Psychiatric:        Mood and Affect: Mood is anxious.     Comments: Mildly anxious            Assessment & Plan:   Problem List Items Addressed This Visit      Other   Lower abdominal pain - Primary    In  pt with past h/o chronic abd pain-unsure if the same (poor historian)  Possible h/o vaginal bleeding (none seen on exam and no blood in urine /heme neg stool as well)  Lab today incl urine cx and cbd/chem   Consider gyn ref  tx with abx if urine cx is pos inst to go to ER if pain becomes severe  Pending labs May need CT if indication of diverticulitis       Relevant Orders   POCT Urinalysis Dipstick (Automated) (Completed)   CBC with Differential/Platelet   Comprehensive metabolic panel   Urine Culture   Abnormal urinalysis    With low abd pain  Also pain on bimanual exam  Concentrated urine with protein and sm leuk Pend cx Enc strongly to inc her fluids        Relevant Orders   Urine Culture   Vaginal bleeding    Per pt -blood on pad/not entirely sure vaginal  None seen on speculum exam  Heme neg stool also   Pending labs Will likely ref to gyn

## 2020-04-03 NOTE — Assessment & Plan Note (Signed)
In pt with past h/o chronic abd pain-unsure if the same (poor historian)  Possible h/o vaginal bleeding (none seen on exam and no blood in urine /heme neg stool as well)  Lab today incl urine cx and cbd/chem   Consider gyn ref  tx with abx if urine cx is pos inst to go to ER if pain becomes severe  Pending labs May need CT if indication of diverticulitis

## 2020-04-03 NOTE — Assessment & Plan Note (Signed)
Per pt -blood on pad/not entirely sure vaginal  None seen on speculum exam  Heme neg stool also   Pending labs Will likely ref to gyn

## 2020-04-04 LAB — CBC WITH DIFFERENTIAL/PLATELET
Basophils Absolute: 0.1 10*3/uL (ref 0.0–0.1)
Basophils Relative: 1.1 % (ref 0.0–3.0)
Eosinophils Absolute: 0.2 10*3/uL (ref 0.0–0.7)
Eosinophils Relative: 3.5 % (ref 0.0–5.0)
HCT: 40.1 % (ref 36.0–46.0)
Hemoglobin: 13.4 g/dL (ref 12.0–15.0)
Lymphocytes Relative: 44.8 % (ref 12.0–46.0)
Lymphs Abs: 2 10*3/uL (ref 0.7–4.0)
MCHC: 33.4 g/dL (ref 30.0–36.0)
MCV: 87.7 fl (ref 78.0–100.0)
Monocytes Absolute: 0.3 10*3/uL (ref 0.1–1.0)
Monocytes Relative: 7.6 % (ref 3.0–12.0)
Neutro Abs: 1.9 10*3/uL (ref 1.4–7.7)
Neutrophils Relative %: 43 % (ref 43.0–77.0)
Platelets: 253 10*3/uL (ref 150.0–400.0)
RBC: 4.57 Mil/uL (ref 3.87–5.11)
RDW: 17 % — ABNORMAL HIGH (ref 11.5–15.5)
WBC: 4.5 10*3/uL (ref 4.0–10.5)

## 2020-04-04 LAB — COMPREHENSIVE METABOLIC PANEL
ALT: 6 U/L (ref 0–35)
AST: 17 U/L (ref 0–37)
Albumin: 4.3 g/dL (ref 3.5–5.2)
Alkaline Phosphatase: 64 U/L (ref 39–117)
BUN: 9 mg/dL (ref 6–23)
CO2: 27 mEq/L (ref 19–32)
Calcium: 9.3 mg/dL (ref 8.4–10.5)
Chloride: 102 mEq/L (ref 96–112)
Creatinine, Ser: 0.87 mg/dL (ref 0.40–1.20)
GFR: 76.21 mL/min (ref >60.00)
Glucose, Bld: 110 mg/dL — ABNORMAL HIGH (ref 70–99)
Potassium: 3.4 mEq/L — ABNORMAL LOW (ref 3.5–5.1)
Sodium: 137 mEq/L (ref 135–145)
Total Bilirubin: 0.4 mg/dL (ref 0.2–1.2)
Total Protein: 7.8 g/dL (ref 6.0–8.3)

## 2020-04-04 LAB — URINE CULTURE
MICRO NUMBER:: 10593140
Result:: NO GROWTH
SPECIMEN QUALITY:: ADEQUATE

## 2020-04-05 ENCOUNTER — Telehealth: Payer: Self-pay | Admitting: Family Medicine

## 2020-04-05 DIAGNOSIS — R1030 Lower abdominal pain, unspecified: Secondary | ICD-10-CM

## 2020-04-05 DIAGNOSIS — N939 Abnormal uterine and vaginal bleeding, unspecified: Secondary | ICD-10-CM

## 2020-04-05 NOTE — Telephone Encounter (Signed)
-----   Message from Tammi Sou, [place] sent at 04/05/2020  1:08 PM EDT ----- Pt notified of urine cx results and Dr. Marliss Coots comments. Pt agrees with Gyn referral she would like to see someone in [place] if possible. I advise pt our [facility] will call to schedule appt., please put referal in

## 2020-04-05 NOTE — Telephone Encounter (Signed)
Referral done Will direct to [facility]

## 2020-04-09 ENCOUNTER — Other Ambulatory Visit: Payer: Self-pay

## 2020-04-11 ENCOUNTER — Ambulatory Visit: Payer: Medicare Other | Admitting: Obstetrics and Gynecology

## 2020-04-11 ENCOUNTER — Other Ambulatory Visit: Payer: Self-pay

## 2020-04-11 ENCOUNTER — Encounter: Payer: Self-pay | Admitting: Obstetrics and Gynecology

## 2020-04-11 VITALS — BP 140/82 | Ht 66.5 in | Wt 192.0 lb

## 2020-04-11 DIAGNOSIS — N950 Postmenopausal bleeding: Secondary | ICD-10-CM

## 2020-04-11 DIAGNOSIS — R109 Unspecified abdominal pain: Secondary | ICD-10-CM

## 2020-04-11 NOTE — Progress Notes (Signed)
[name] [facility] 09-22-1941 [id number]  SUBJECTIVE:  79 y.o. 79 y.o. G3P3003 female with history of diverticulosis and gastritis presents for evaluation of middle abdominal pain around the belly button area and just below.  She says the abdominal discomfort is intermittent and flares up every day, pain is around umbilical area to just below, denies any pelvic discomfort.  She does not related to eating or physical activity.   Abdominal discomfort is in the mid to upper abdomen and can be on the right or left side.  No radiation of the pain.  No nausea.  States she has 1-2 formed stools every day without any rectal bleeding.  She says it started within the past several months when she started Humira injections.  She did also mention having some vaginal bleeding that started after the injections as well.  She has had about 3 episodes of very light spotting now since the injections.  She denies ever being on any hormone replacement therapy and no other episodes of vaginal bleeding since menopause.  Still gets mild hot flashes. Uterus appeared normal on 01/2017 CT imaging.  Colonoscopy was 2017.  Does not recall any prior abnormal Pap smear history.  Current Outpatient Medications  Medication Sig Dispense Refill  . acetaminophen (TYLENOL) 325 MG tablet Take 650 mg by mouth every 6 (six) hours as needed.    Marland Kitchen albuterol (PROVENTIL HFA;VENTOLIN HFA) 108 (90 Base) MCG/ACT inhaler Inhale 2 puffs into the lungs every 4 (four) hours as needed for wheezing or shortness of breath. Please give spacer if possible 1 Inhaler 3  . amLODipine (NORVASC) 5 MG tablet TAKE 1 TABLET BY MOUTH EVERY DAY 90 tablet 0  . atorvastatin (LIPITOR) 10 MG tablet Take 0.5 tablets (5 mg total) by mouth every other day. 23 tablet 3  . Calcium & Magnesium Carbonates (MYLANTA PO) Take 1 capsule by mouth as directed.      . cyclobenzaprine (FLEXERIL) 5 MG tablet     . dicyclomine (BENTYL) 20 MG tablet Take 1 tablet (20 mg total) by mouth 4 (four)  times daily -  before meals and at bedtime. 120 tablet 5  . docusate sodium (COLACE) 100 MG capsule Take 1 capsule (100 mg total) by mouth 2 (two) times daily. 60 capsule 0  . EPINEPHrine (EPIPEN IJ) Inject as directed as needed. Reported on 12/05/2015    . feeding supplement (BOOST / RESOURCE BREEZE) LIQD Take 1 Container by mouth 3 (three) times daily between meals. 90 Container 0  . fluticasone (FLONASE) 50 MCG/ACT nasal spray PLACE 2 SPRAYS INTO THE NOSE DAILY. 48 g 3  . folic acid (FOLVITE) 1 MG tablet Take 1 mg by mouth daily.    Marland Kitchen loratadine (CLARITIN) 10 MG tablet Take 10 mg by mouth daily.    . methotrexate (RHEUMATREX) 2.5 MG tablet Take 15 mg by mouth once a week. Caution:Chemotherapy. Protect from light. ( Monday of each week )    . pantoprazole (PROTONIX) 40 MG tablet Take 40 mg by mouth daily.  2  . polyethylene glycol (MIRALAX / GLYCOLAX) packet Take 17 g by mouth daily.    . predniSONE (DELTASONE) 5 MG tablet Take 5 mg by mouth daily.    Marland Kitchen HUMIRA PEN 40 MG/0.4ML PNKT Inject 40 mg as directed every 14 (fourteen) days. (Patient not taking: Reported on 04/11/2020)     Current Facility-Administered Medications  Medication Dose Route Frequency Provider Last Rate Last Admin  . lidocaine (PF) (XYLOCAINE) 1 % injection 0.3 mL  0.3  mL Other Once Magnus Sinning, MD       Allergies: Bee venom and Other  No LMP recorded. Patient is postmenopausal.  Past medical history,surgical history, problem list, medications, allergies, family history and social history were all reviewed and documented as reviewed in the EPIC chart.  ROS:  Feeling well. No dyspnea or chest pain on exertion.  + abdominal pain, no change in bowel habits or black or bloody stools.  No urinary tract symptoms. GYN ROS: + Occasional spotting, pelvic pain or discharge.   OBJECTIVE:  BP 140/82   Ht 5' 6.5" (1.689 m)   Wt 192 lb (87.1 kg)   BMI 30.53 kg/m  The patient appears well, alert, oriented x 3, in no  distress. Abdomen: Diffusely tender to palpation with mild bloating noted, no rebound or guarding PELVIC EXAM: VULVA: normal appearing vulva with no masses, tenderness or lesions, VAGINA: normal appearing vagina with normal color and discharge, no lesions, CERVIX: normal appearing cervix without discharge or lesions, UTERUS: uterus is normal size, shape, consistency and nontender, ADNEXA: normal adnexa in size, nontender and no masses  Recent urine culture noted to be negative, UA repeated today indicates 1020 WBC, 20-40 squamous epithelial cells, many bacteria, cloudy color, 1+ leukocyte esterase, negative nitrate.  Chaperone: Caryn Bee present during the examination  ASSESSMENT:  79 y.o. [phone] here for abdominal pain of uncertain etiology and episodic vaginal bleeding  PLAN:  With the recurrent vaginal bleeding episodes it is important to evaluate the endometrium and we will schedule a pelvic ultrasound.  She is certain she is not having any rectal bleeding, but it is noted that she has a history of rectal bleeding and also hemorrhoids were noted on examination today. Her abdominal pain seems to be concentrated higher than the pelvic area so not sure I can relate this symptom to any of her pelvic organs but while we are evaluating the endometrium on ultrasound we will also be able to check out the uterus and ovaries in more detail.  Low suspicion for UTI based on today's UA and recent negative urine culture.  Seems to be more likely a GI issue.  No uterine enlargement or adnexal masses are appreciated on exam today.  She will stop at the checkout window to schedule the ultrasound.    Joseph Pierini MD 04/11/20

## 2020-04-13 LAB — URINALYSIS, COMPLETE W/RFL CULTURE
Bilirubin Urine: NEGATIVE
Glucose, UA: NEGATIVE
Hgb urine dipstick: NEGATIVE
Hyaline Cast: NONE SEEN /LPF
Nitrites, Initial: NEGATIVE
Protein, ur: NEGATIVE
RBC / HPF: NONE SEEN /HPF (ref 0–2)
Specific Gravity, Urine: 1.025 (ref 1.001–1.03)
pH: 5 (ref 5.0–8.0)

## 2020-04-13 LAB — URINE CULTURE
MICRO NUMBER:: 10625070
Result:: NO GROWTH
SPECIMEN QUALITY:: ADEQUATE

## 2020-04-13 LAB — CULTURE INDICATED

## 2020-04-19 ENCOUNTER — Encounter: Payer: Self-pay | Admitting: Obstetrics and Gynecology

## 2020-04-19 ENCOUNTER — Ambulatory Visit (INDEPENDENT_AMBULATORY_CARE_PROVIDER_SITE_OTHER): Payer: Medicare Other

## 2020-04-19 ENCOUNTER — Other Ambulatory Visit: Payer: Self-pay

## 2020-04-19 ENCOUNTER — Ambulatory Visit (INDEPENDENT_AMBULATORY_CARE_PROVIDER_SITE_OTHER): Payer: Medicare Other | Admitting: Obstetrics and Gynecology

## 2020-04-19 VITALS — BP 120/80

## 2020-04-19 DIAGNOSIS — R109 Unspecified abdominal pain: Secondary | ICD-10-CM | POA: Diagnosis not present

## 2020-04-19 DIAGNOSIS — N838 Other noninflammatory disorders of ovary, fallopian tube and broad ligament: Secondary | ICD-10-CM | POA: Diagnosis not present

## 2020-04-19 DIAGNOSIS — N950 Postmenopausal bleeding: Secondary | ICD-10-CM | POA: Diagnosis not present

## 2020-04-19 DIAGNOSIS — R9389 Abnormal findings on diagnostic imaging of other specified body structures: Secondary | ICD-10-CM

## 2020-04-19 DIAGNOSIS — D219 Benign neoplasm of connective and other soft tissue, unspecified: Secondary | ICD-10-CM | POA: Diagnosis not present

## 2020-04-19 NOTE — Progress Notes (Addendum)
   Teandra Harlan [facility] [birth year] [id number]  SUBJECTIVE:  79 y.o. G3P3003 female presents for a pelvic ultrasound to evaluate postmenopausal bleeding and abdominal pain.  She has had very light bleeding since her last appointment here on 04/11/2020.  Abdominal discomfort is intermittent.  Not having pain at the moment.   Allergies: Bee venom and Other  No LMP recorded. Patient is postmenopausal.  Past medical history,surgical history, problem list, medications, allergies, family history and social history were all reviewed and documented as reviewed in the EPIC chart.  OBJECTIVE:  BP 120/80 (BP Location: Right Arm, Patient Position: Sitting, Cuff Size: Normal)  The patient appears well, alert, oriented x 3, in no distress. PELVIC EXAM: Deferred as this was performed at the last visit  Pelvic ultrasound Uterus 6.9 x 4.4 x 4.0 cm. Multiple fibroids present, largest measuring 2.6 cm maximum dimension, various locations intramural, subserosal, submucosal.  Some with calcifications. Thickened endometrium measuring 6 mm. Bilateral ovaries visualized with some difficulty.  Left ovary appears overall enlarged 2.7 x 2.0 cm with possible cystic structure, compared to the right ovary which measures 1.0 x 0.5 cm. No adnexal masses.  No free fluid.  ASSESSMENT:  79 y.o. [id number] here with left ovarian enlargement and thickened endometrium in setting of postmenopausal bleeding, uterine fibroids identified  PLAN:  1.  Postmenopausal bleeding.  We discussed that thickened endometrium could represent benign condition but also possible endometrial hyperplasia or cancer.  We need to perform an endometrial biopsy which she would prefer to do at a separate visit since she was having some discomfort with the ultrasound today.  Encouraged her to take ibuprofen ahead of time before that exam. 2.  Left ovarian enlargement.  I would suggest checking a CA-125 and repeating a pelvic ultrasound in 3 to 6 months for  follow-up.  No obvious defined mass or cyst but the enlargement is notable compared to the contralateral ovary.  She will make an appointment to return for the next part of the work-up. 3.  Uterine fibroids.  Appear to be stable in size as they appeared on CT imaging she had just over 3 years ago.  The previous CT imaging was personally reviewed.  At that time there were some small calcified fibroids noted corresponding to the ultrasound findings today.  No additional follow-up needed for this.   Joseph Pierini MD 04/19/20

## 2020-04-19 NOTE — Patient Instructions (Signed)
The pelvic ultrasound today shows thickening of the lining inside of the uterus (endometrium).  Since you have been having some vaginal bleeding, this finding suggests possible abnormal tissue in the uterus, so we want to evaluate for hyperplasia or cancer of the endometrial lining.  This requires an endometrial biopsy which we we will have you make an appointment to come back to do.  I recommend taking 2 or 3 tablets of ibuprofen with food within 30 minutes of the procedure to decrease the cramping during the procedure.  There is also possible enlargement of the left ovary, but it was a little hard to see on the ultrasound, but it did appear larger than the other ovary.  This suggests there might be a cyst on the ovary which can be a benign finding, but we also want to check a blood test to see if we should be worried about ovarian cancer.  We will do this at your next appointment by checking a blood test called CA-125.

## 2020-04-24 ENCOUNTER — Encounter: Payer: Self-pay | Admitting: Obstetrics and Gynecology

## 2020-04-24 ENCOUNTER — Ambulatory Visit: Payer: Medicare Other | Admitting: Obstetrics and Gynecology

## 2020-04-24 ENCOUNTER — Other Ambulatory Visit: Payer: Medicare Other

## 2020-04-24 ENCOUNTER — Other Ambulatory Visit: Payer: Self-pay

## 2020-04-24 VITALS — BP 122/80

## 2020-04-24 DIAGNOSIS — R109 Unspecified abdominal pain: Secondary | ICD-10-CM | POA: Diagnosis not present

## 2020-04-24 DIAGNOSIS — R9389 Abnormal findings on diagnostic imaging of other specified body structures: Secondary | ICD-10-CM

## 2020-04-24 DIAGNOSIS — N838 Other noninflammatory disorders of ovary, fallopian tube and broad ligament: Secondary | ICD-10-CM

## 2020-04-24 DIAGNOSIS — N950 Postmenopausal bleeding: Secondary | ICD-10-CM

## 2020-04-24 NOTE — Addendum Note (Signed)
Addended by: Joseph Pierini D on: 04/24/2020 12:32 PM   Modules accepted: Orders

## 2020-04-25 LAB — CA 125: CA 125: 11 U/mL (ref <35)

## 2020-04-26 ENCOUNTER — Telehealth: Payer: Self-pay | Admitting: Family Medicine

## 2020-04-26 ENCOUNTER — Telehealth: Payer: Self-pay

## 2020-04-26 NOTE — Telephone Encounter (Signed)
Pt wants to know the name of the doctor she saw in [place] at [facility] for GI doctor?? She wants a call back. I tried helping her by letting her know that it showed she went to [facility] from what I seen in the recall section on appt desk. She said that wasn't correct and you would be able to help her.

## 2020-04-26 NOTE — Telephone Encounter (Signed)
Spoke with patient and informed her.  She said she will call Dr. Glori Bickers, her PCP , today.

## 2020-04-26 NOTE — Telephone Encounter (Signed)
Recommend endometrial biopsy because she has thickening in the endometrium.  I do not think this has anything to do with her abdominal pain. I do not think the abdominal pain is from the ovary because that is a very small cyst (if it is even a cyst) on that left side. She has uterine fibroids but these are very small so I do not think that this would be causing pain in the mid or upper abdomen.  On her exam I remarked that she had bloating and abdominal discomfort diffusely so I think it sounds to be more of a GI issue.  I would recommend checking in with her primary doctor for further evaluation of her stomach area for potential causes of pain, but she does need to come in for an endometrial biopsy.

## 2020-04-26 NOTE — Telephone Encounter (Signed)
Per Care Everywhere pt has contacted Dr. Roney Mans ([facility]) and has already touched basis with them regarding her GI issue

## 2020-04-26 NOTE — Telephone Encounter (Signed)
-----   Message from Joseph Pierini, MD sent at 04/26/2020 10:24 AM EDT ----- Please let the patient know that the CA-125 tumor marker was in a normal range.  That enlargement of the left ovary with possible cyst is most likely benign based on the appearance and this lab result.  I would recommend that she get another pelvic ultrasound in 3 to 6 months to reevaluate that left ovary.

## 2020-04-26 NOTE — Telephone Encounter (Signed)
Informed of results. She asked me "then why is my stomach still hurting"?  I told her I was not aware of her history and not sure that Dr. Delilah Shan will know why her stomach is hurting but she wanted me to ask.

## 2020-04-26 NOTE — Progress Notes (Signed)
Please let the patient know that the CA-125 tumor marker was in a normal range.  That enlargement of the left ovary with possible cyst is most likely benign based on the appearance and this lab result.  I would recommend that she get another pelvic ultrasound in 3 to 6 months to reevaluate that left ovary.

## 2020-04-30 ENCOUNTER — Encounter: Payer: Self-pay | Admitting: Obstetrics and Gynecology

## 2020-04-30 ENCOUNTER — Ambulatory Visit: Payer: Medicare Other | Admitting: Obstetrics and Gynecology

## 2020-04-30 ENCOUNTER — Other Ambulatory Visit: Payer: Self-pay

## 2020-04-30 VITALS — BP 130/80

## 2020-04-30 DIAGNOSIS — R9389 Abnormal findings on diagnostic imaging of other specified body structures: Secondary | ICD-10-CM | POA: Diagnosis not present

## 2020-04-30 DIAGNOSIS — N950 Postmenopausal bleeding: Secondary | ICD-10-CM | POA: Diagnosis not present

## 2020-04-30 DIAGNOSIS — N838 Other noninflammatory disorders of ovary, fallopian tube and broad ligament: Secondary | ICD-10-CM

## 2020-04-30 NOTE — Progress Notes (Signed)
   [facility] [birth year] [id number]  SUBJECTIVE:  79 y.o. [phone] female presents for an endometrial biopsy to work-up postmenopausal bleeding with thickened endometrium identified on ultrasound (6 mm thickness).  She has continued to have very light bleeding most days since she was initially seen for this issue a few weeks ago.  She does have small enlargement of the left ovary and her CA-125 was normal.   Allergies: Bee venom  No LMP recorded. Patient is postmenopausal.  Past medical history,surgical history, problem list, medications, allergies, family history and social history were all reviewed and documented as reviewed in the EPIC chart.   OBJECTIVE:  BP 130/80  PELVIC EXAM: VULVA: normal appearing vulva with no masses, tenderness or lesions, VAGINA: normal appearing vagina with normal color and discharge, no lesions, CERVIX: normal appearing cervix without discharge or lesions  Endometrial Biopsy Procedure Note Pansy Ostrovsky [facility] 1941/06/16 [id number] The cervix was cleansed with a Betadine swab.  The endometrial Pipelle sampler was easily introduced into the endometrial cavity and sound length measurement was taken (7 cm).  The plunger was withdrawn causing suction in the Pipelle and the device was moved about the cavity for about 10 sec.  The Pipelle was removed then there was a small amount of tissue sample which was emptied into the specimen cup, maintaining sterility.  The Pipelle was reintroduced into the endometrial cavity to collect any remaining sample.  Overall, scant tissue was obtained.  The specimen contents were collected and sent to pathology for analysis.  The patient tolerated the procedure well without any complication.  Chaperone: Caryn Bee present during the examination and procedure.   ASSESSMENT:  79 y.o. [id number] here for endometrial biopsy, also with left ovarian enlargement  PLAN:  I will notify the patient of the pathology results when available. We  discussed that there was fairly minimal tissue sample today, so if either there is insufficient tissue sampling for analysis, and/or if the vaginal bleeding were to continue, we may need to consider moving forward with hysteroscopy D&C. We will be following up the left ovary with a repeat ultrasound in 3 to 6 months which is ordered.  Her CA-125 level was in the normal range so unlikely to be an ovarian malignancy.   Joseph Pierini MD 04/30/20

## 2020-05-02 ENCOUNTER — Other Ambulatory Visit: Payer: Self-pay | Admitting: Medical

## 2020-05-02 ENCOUNTER — Other Ambulatory Visit (HOSPITAL_COMMUNITY): Payer: Self-pay | Admitting: Medical

## 2020-05-02 DIAGNOSIS — R634 Abnormal weight loss: Secondary | ICD-10-CM

## 2020-05-02 DIAGNOSIS — Z8719 Personal history of other diseases of the digestive system: Secondary | ICD-10-CM | POA: Diagnosis not present

## 2020-05-02 DIAGNOSIS — R1084 Generalized abdominal pain: Secondary | ICD-10-CM | POA: Diagnosis not present

## 2020-05-02 DIAGNOSIS — Z86010 Personal history of colonic polyps: Secondary | ICD-10-CM | POA: Diagnosis not present

## 2020-05-02 LAB — PATHOLOGY REPORT

## 2020-05-02 LAB — TISSUE SPECIMEN

## 2020-05-03 NOTE — Progress Notes (Signed)
Please tell the patient that the endometrial biopsy contained very little tissue (as expected from what we saw after the sampling). From what we could see of the small amount of tissue it was just benign/normal, but it might be missing something, so it to be safe, I would recommend considering either a repeat biopsy in the office (which might just result in minimal tissue again) or doing a hysteroscopy D&C.

## 2020-05-08 ENCOUNTER — Other Ambulatory Visit: Payer: Self-pay

## 2020-05-08 ENCOUNTER — Ambulatory Visit (HOSPITAL_COMMUNITY)
Admission: RE | Admit: 2020-05-08 | Discharge: 2020-05-08 | Disposition: A | Discharge status: Home / Self Care | Payer: Medicare Other | Source: Ambulatory Visit | Attending: Medical | Admitting: Medical

## 2020-05-08 DIAGNOSIS — K5730 Diverticulosis of large intestine without perforation or abscess without bleeding: Secondary | ICD-10-CM | POA: Diagnosis not present

## 2020-05-08 DIAGNOSIS — R1084 Generalized abdominal pain: Secondary | ICD-10-CM

## 2020-05-08 DIAGNOSIS — R634 Abnormal weight loss: Secondary | ICD-10-CM | POA: Diagnosis not present

## 2020-05-08 DIAGNOSIS — K449 Diaphragmatic hernia without obstruction or gangrene: Secondary | ICD-10-CM | POA: Diagnosis not present

## 2020-05-08 LAB — POCT I-STAT CREATININE: Creatinine, Ser: 1 mg/dL (ref 0.44–1.00)

## 2020-05-08 MED ORDER — IOHEXOL 300 MG/ML  SOLN
100.0000 mL | Freq: Once | INTRAMUSCULAR | Status: AC | PRN
  Administered 2020-05-08: 100 mL via INTRAVENOUS

## 2020-05-18 ENCOUNTER — Other Ambulatory Visit: Payer: Self-pay | Admitting: Family Medicine

## 2020-05-18 NOTE — Telephone Encounter (Signed)
Pt has had a few recent appts but no lipid labs in over a year, please advise

## 2020-05-18 NOTE — Telephone Encounter (Signed)
Please schedule annual exam or f/u in the fall (her pref) and refill until then

## 2020-05-18 NOTE — Telephone Encounter (Signed)
Med refilled once and Carrie will reach out to pt to try and get appt scheduled  

## 2020-05-18 NOTE — Progress Notes (Unsigned)
Pt apt cancelled and R/S KW

## 2020-05-22 ENCOUNTER — Ambulatory Visit: Payer: Medicare Other | Admitting: Obstetrics and Gynecology

## 2020-05-22 ENCOUNTER — Other Ambulatory Visit: Payer: Self-pay

## 2020-05-22 ENCOUNTER — Encounter: Payer: Self-pay | Admitting: Obstetrics and Gynecology

## 2020-05-22 VITALS — BP 124/80

## 2020-05-22 DIAGNOSIS — N838 Other noninflammatory disorders of ovary, fallopian tube and broad ligament: Secondary | ICD-10-CM

## 2020-05-22 DIAGNOSIS — R9389 Abnormal findings on diagnostic imaging of other specified body structures: Secondary | ICD-10-CM

## 2020-05-22 DIAGNOSIS — R109 Unspecified abdominal pain: Secondary | ICD-10-CM | POA: Diagnosis not present

## 2020-05-22 DIAGNOSIS — N9489 Other specified conditions associated with female genital organs and menstrual cycle: Secondary | ICD-10-CM

## 2020-05-22 DIAGNOSIS — N950 Postmenopausal bleeding: Secondary | ICD-10-CM

## 2020-05-22 NOTE — Progress Notes (Signed)
   [name] [facility] 09/26/1941 [id number]  SUBJECTIVE:  79 y.o. G3P3003 female presents for preoperative consultation regarding upcoming hysteroscopy D&C evaluate postmenopausal bleeding in the setting of thickened endometrium on ultrasound and insufficient endometrial sampling in the office.  She does continue to have a low pelvic cramping in the midline.  Uncertain if she has had any additional vaginal bleeding since the biopsy.  Allergies: Bee venom  No LMP recorded. Patient is postmenopausal.  Past medical history,surgical history, problem list, medications, allergies, family history and social history were all reviewed and documented as reviewed in the EPIC chart.  OBJECTIVE:  BP 124/80 (BP Location: Right Arm, Patient Position: Sitting, Cuff Size: Large)  The patient appears well, alert, oriented x 3, in no distress. PELVIC EXAM: Deferred as recently performed this at last visit   ASSESSMENT:  79 y.o. G3P3003 with recent postmenopausal bleeding episode and thickened endometrium here for preoperative discussion of hysteroscopy D&C, midline lower abdominal cramping, possible pelvic congestion on recent CT scan, left ovarian enlargement  PLAN:  1. PMB and endometrial thickening I recommend proceeding with hysteroscopy D&C since the office biopsy produced insufficient tissue.  I discussed the same-day outpatient intent of the procedure, and risks of infection, bleeding, possible need for blood transfusion, perforation of uterus and/or cervix resulting in injury to surrounding organ structures including major pelvic blood vessels, bowel, bladder, and potentially ureter, and DVT.  Inherent risks with being placed under anesthesia include myocardial infarction, stroke, rarely death.  Very rarely would laparoscopy and/or laparotomy be indicated to tend to any intra-abdominal hemorrhage and/or injury concern.  Postoperative recovery expectations of needing a few days away from employment duties in  the absence of any complication were also discussed.  I provided the patient with ACOG informational pamphlet on hysteroscopy.  She is in agreement to proceed and we will have staff coordinate the procedure with her. 2.  Lower abdominal cramping Uncertain if this is a gynecologic etiology.  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  Slight left ovarian enlargement.  None of this would necessarily explain midline lower abdominal cramping.  I see that she had a negative urine culture 5 to 6 weeks ago obtained after her symptoms had started.  I see the recent CT abdomen/pelvis indicates possible mild pelvic congestion, which we briefly discussed today.  It sounds like her symptoms might be a little bit worse when she is upright or standing, which could be consistent with pelvic congestion syndrome.  I am not aware of any treatment for this other than use of NSAIDs and recumbent positioning as needed. 3.  Left ovarian enlargement Possible small ovarian cyst identified at recent ultrasound.  CA-125 was 11.  More likely consistent with benign process.  Recommend follow-up ultrasound in 3 months which is ordered and she will plan to schedule.    Joseph Pierini MD 05/22/20

## 2020-05-29 ENCOUNTER — Telehealth: Payer: Self-pay

## 2020-05-29 NOTE — Telephone Encounter (Signed)
I spoke with patient about scheduling surgery.  We discussed dates. Her husband has been hospitalized and has just come home. She prefers to wait a little while until she can get him settled and make sure all okay with him.  She chose 07/06/20 at 7:30am at [facility].  I advised her about need for Covid testing pre operatively and appt was scheduled. I reviewed the quarantine protocol for after the test with her. I will mail her a COvid sheet with date/time and the site map.  I advised her Dr. Raliegh Ip recommended PCP medical clearance for anesthesia.  She is willing to have visit sooner than her schedule annual visit at end of mos with PCP if they require a visit to clear her.  I will fax form to PCP office today.  No pre op payment due as her ins pays professional fees at 100%.  Packet will be mailed.

## 2020-06-01 ENCOUNTER — Other Ambulatory Visit: Payer: Self-pay

## 2020-06-01 ENCOUNTER — Encounter: Payer: Self-pay | Admitting: Family Medicine

## 2020-06-01 ENCOUNTER — Ambulatory Visit (INDEPENDENT_AMBULATORY_CARE_PROVIDER_SITE_OTHER): Payer: Medicare Other | Admitting: Family Medicine

## 2020-06-01 VITALS — BP 122/76 | HR 81 | Temp 95.4°F | Ht 66.5 in | Wt 194.2 lb

## 2020-06-01 DIAGNOSIS — Z01818 Encounter for other preprocedural examination: Secondary | ICD-10-CM | POA: Insufficient documentation

## 2020-06-01 DIAGNOSIS — I700 Atherosclerosis of aorta: Secondary | ICD-10-CM

## 2020-06-01 DIAGNOSIS — M069 Rheumatoid arthritis, unspecified: Secondary | ICD-10-CM

## 2020-06-01 DIAGNOSIS — E119 Type 2 diabetes mellitus without complications: Secondary | ICD-10-CM

## 2020-06-01 DIAGNOSIS — I10 Essential (primary) hypertension: Secondary | ICD-10-CM | POA: Diagnosis not present

## 2020-06-01 DIAGNOSIS — I2510 Atherosclerotic heart disease of native coronary artery without angina pectoris: Secondary | ICD-10-CM | POA: Diagnosis not present

## 2020-06-01 DIAGNOSIS — I2584 Coronary atherosclerosis due to calcified coronary lesion: Secondary | ICD-10-CM

## 2020-06-01 NOTE — Assessment & Plan Note (Signed)
bp in fair control at this time  BP Readings from Last 1 Encounters:  06/01/20 122/76   No changes needed Most recent labs reviewed  Disc lifstyle change with low sodium diet and exercise

## 2020-06-01 NOTE — Assessment & Plan Note (Signed)
No clinical changes  Good bp and cholesterol control

## 2020-06-01 NOTE — Patient Instructions (Addendum)
Call your rheumatologist re: the covid vaccine (you may qualify for a booster) from [facility] rheumatology  Also ask if you need to hold any of your medicines for surgery   I will send the form back to your gyn/sugeon   Take care of yourself

## 2020-06-01 NOTE — Progress Notes (Signed)
Subjective:    Patient ID: [name], female    DOB: Oct 22, 1940, 79 y.o.   MRN: [id number]  This visit occurred during the SARS-CoV-2 public health emergency.  Safety protocols were in place, including screening questions prior to the visit, additional usage of staff PPE, and extensive cleaning of exam room while observing appropriate contact time as indicated for disinfecting solutions.    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) Past surgeries incl ccy and vag polyp exc  Anesthesia - no problems in the past with general or local  No nausea either    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 and no re occurrence  Had some CAD on rad study in the past as well as aortic atherosclerosis  Last echo in 2017 showed grade 1 diastolic dysfunction and EF of 55-60% Nl myocardial perf imaging in 2003  No cp or sob   HTN-well controlled bp is stable today  No cp or palpitations or headaches or edema  No side effects to medicines  BP Readings from Last 3 Encounters:  06/01/20 122/76  05/22/20 124/80  04/30/20 130/80     Pulse Readings from Last 3 Encounters:  06/01/20 81  04/03/20 92  01/30/20 76   Former smoker- quit in [place] for 10 years   EKG today  NSR with rate of 69 and no acute changes or significant change from past    DM 2 Lab Results  Component Value Date   HGBA1C 6.3 01/30/2020   Well controlled /prediabetic range a1c  On predisone   RA- treated by rheumatology   Methotrexate humira Prednisone- takes 5 mg bid at the max   Had covid vaccines  ? If will need a booster-pleans to check in with rheumatologist  Patient Active Problem List   Diagnosis Date Noted  . Lower abdominal pain 04/03/2020  . Abnormal urinalysis 04/03/2020  . Vaginal bleeding 04/03/2020  . Fatigue 01/30/2020  . TMJ (dislocation of temporomandibular joint) 04/29/2019  . Encounter for screening mammogram for breast cancer 10/27/2017  . Routine general medical examination at a health care facility 10/27/2017  . Aortic atherosclerosis ([place]) 02/03/2017  . RA (rheumatoid arthritis) ([place])   . Controlled type 2 diabetes mellitus without complication, without long-term current use of insulin ([place])   . Adjustment disorder with mixed anxiety and depressed mood 06/24/2016  . Essential hypertension 01/07/2016  . Coronary atherosclerosis 11/07/2015  . GERD 07/09/2007   Past Medical History:  Diagnosis Date  . Allergic rhinitis   . Anxiety   . Cataract 2015   bilateral  . Diverticulosis   . Esophageal reflux   . Essential hypertension 01/07/2016  . Fatty liver 2018  . Fibula fracture 02/2005  . Former smoker   . Gastritis 2003  . Heart murmur   . Hemorrhoids   . History of nephrolithiasis   . Hyperglycemia   . Hyperlipemia   . Osteoarthritis   . Personal history of colonic polyps  1998   hyperplastic  . RA (rheumatoid arthritis) ([place])   . Shingles 2012  . Uterine fibroid    Past Surgical History:  Procedure Laterality Date  . CHOLECYSTECTOMY    . COLONOSCOPY  11/08   internal hemorrhoids  . EP procedure  1994   SVT; normal echo  . ESOPHAGOGASTRODUODENOSCOPY  11/03   reactive gastropathy  . LIPOMA EXCISION  11/2002  . SVT s/p surgery--? ablation    . TUBAL LIGATION    . UPPER GASTROINTESTINAL ENDOSCOPY    . vaginal polyp excised  12/2000   benign   Social History   Tobacco Use  . Smoking status: Former Smoker    Quit date: 10/21/2003    Years since quitting:  16.6  . Smokeless tobacco: Never Used  Vaping Use  . Vaping Use: Never used  Substance Use Topics  . Alcohol use: No    Alcohol/week: 0.0 standard drinks  . Drug use: No   Family History  Problem Relation Age of Onset  . Colon cancer Father 106  . Diabetes Mother   . Heart disease Mother   . Kidney disease Mother        ESRD  . Stroke Mother   . Hypertension Brother   . Breast cancer Sister 9  . Diabetes Other        nephews  . Colon cancer Sister   . Esophageal cancer Neg Hx   . Stomach cancer Neg Hx   . Rectal cancer Neg Hx    Allergies  Allergen Reactions  . Bee Venom Anaphylaxis   Current Outpatient Medications on File Prior to Visit  Medication Sig Dispense Refill  . acetaminophen (TYLENOL) 325 MG tablet Take 650 mg by mouth every 6 (six) hours as needed.    Marland Kitchen albuterol (PROVENTIL HFA;VENTOLIN HFA) 108 (90 Base) MCG/ACT inhaler Inhale 2 puffs into the lungs every 4 (four) hours as needed for wheezing or shortness of breath. Please give spacer if possible 1 Inhaler 3  . amLODipine (NORVASC) 5 MG tablet TAKE 1 TABLET BY MOUTH EVERY DAY 90 tablet 0  . atorvastatin (LIPITOR) 10 MG tablet TAKE 0.5 TABLETS (5 MG TOTAL) BY MOUTH EVERY OTHER DAY. 23 tablet 0  . Calcium & Magnesium Carbonates (MYLANTA PO) Take 1 capsule by mouth as directed.      . cyclobenzaprine (FLEXERIL) 5 MG tablet     . dicyclomine (BENTYL) 20 MG tablet Take 1 tablet (20 mg total) by mouth 4 (four) times daily -  before meals and at bedtime. 120 tablet 5  . docusate sodium (COLACE) 100 MG capsule Take 1 capsule (100 mg total) by mouth 2 (two) times daily. 60 capsule 0  . EPINEPHrine (EPIPEN IJ) Inject as directed as needed. Reported on 12/05/2015    . feeding supplement (BOOST / RESOURCE BREEZE) LIQD Take 1 Container by mouth 3 (three) times daily between meals. 90 Container 0  . fluticasone (FLONASE) 50 MCG/ACT nasal spray PLACE 2 SPRAYS INTO THE NOSE DAILY. 48 g 3  . folic acid (FOLVITE) 1 MG tablet Take  1 mg by mouth daily.    Marland Kitchen HUMIRA PEN 40 MG/0.4ML PNKT Inject 40 mg as directed every 14 (fourteen) days.     Marland Kitchen loratadine (CLARITIN) 10 MG tablet Take 10 mg by mouth daily.    . methotrexate (RHEUMATREX) 2.5 MG tablet Take 15 mg by mouth once a week. Caution:Chemotherapy. Protect from light. ( Monday of each week )    . pantoprazole (PROTONIX) 40  MG tablet Take 40 mg by mouth daily.  2  . polyethylene glycol (MIRALAX / GLYCOLAX) packet Take 17 g by mouth daily.    . predniSONE (DELTASONE) 5 MG tablet Take 5 mg by mouth daily.     Current Facility-Administered Medications on File Prior to Visit  Medication Dose Route Frequency Provider Last Rate Last Admin  . lidocaine (PF) (XYLOCAINE) 1 % injection 0.3 mL  0.3 mL Other Once Magnus Sinning, MD        Review of Systems  Constitutional: Negative for activity change, appetite change, fatigue, fever and unexpected weight change.  HENT: Negative for congestion, ear pain, rhinorrhea, sinus pressure and sore throat.   Eyes: Negative for pain, redness and visual disturbance.  Respiratory: Negative for cough, shortness of breath and wheezing.   Cardiovascular: Negative for chest pain and palpitations.  Gastrointestinal: Negative for abdominal pain, blood in stool, constipation and diarrhea.  Endocrine: Negative for polydipsia and polyuria.  Genitourinary: Positive for pelvic pain. Negative for dysuria, frequency and urgency.       No more vaginal bleeding   Musculoskeletal: Positive for arthralgias. Negative for back pain and myalgias.       RA pain   Skin: Negative for pallor and rash.  Allergic/Immunologic: Negative for environmental allergies.  Neurological: Negative for dizziness, syncope and headaches.  Hematological: Negative for adenopathy. Does not bruise/bleed easily.  Psychiatric/Behavioral: Negative for decreased concentration and dysphoric mood. The patient is not nervous/anxious.        Objective:   Physical  Exam Constitutional:      General: She is not in acute distress.    Appearance: Normal appearance. She is well-developed. She is obese. She is not ill-appearing or diaphoretic.  HENT:     Head: Normocephalic and atraumatic.  Eyes:     Conjunctiva/sclera: Conjunctivae normal.     Pupils: Pupils are equal, round, and reactive to light.  Neck:     Thyroid: No thyromegaly.     Vascular: No carotid bruit or JVD.  Cardiovascular:     Rate and Rhythm: Normal rate and regular rhythm.     Heart sounds: Normal heart sounds. No gallop.   Pulmonary:     Effort: Pulmonary effort is normal. No respiratory distress.     Breath sounds: Normal breath sounds. No wheezing or rales.  Abdominal:     General: Bowel sounds are normal. There is no distension or abdominal bruit.     Palpations: Abdomen is soft. There is no mass.     Tenderness: There is no abdominal tenderness.  Musculoskeletal:     Cervical back: Normal range of motion and neck supple.     Right lower leg: No edema.     Left lower leg: No edema.     Comments: Joint changes or RA  Lymphadenopathy:     Cervical: No cervical adenopathy.  Skin:    General: Skin is warm and dry.     Findings: No rash.  Neurological:     Mental Status: She is alert.     Sensory: No sensory deficit.     Coordination: Coordination normal.     Deep Tendon Reflexes: Reflexes are normal and symmetric. Reflexes normal.  Psychiatric:        Mood and Affect: Mood normal.           Assessment & Plan:   Problem List Items Addressed This Visit      Cardiovascular and Mediastinum   Coronary atherosclerosis    Doing  well with med management  Cholesterol and bp control       Essential hypertension    bp in fair control at this time  BP Readings from Last 1 Encounters:  06/01/20 122/76   No changes needed Most recent labs reviewed  Disc lifstyle change with low sodium diet and exercise        Aortic atherosclerosis (HCC)    No clinical changes   Good bp and cholesterol control         Endocrine   Controlled type 2 diabetes mellitus without complication, without long-term current use of insulin (HCC)    Lab Results  Component Value Date   HGBA1C 6.3 01/30/2020   In prediabetic range  Diet controlled        Musculoskeletal and Integument   RA (rheumatoid arthritis) (HCC)    Taking methotrexate and Humira and prednisone She will check with rheumatologist to see if anything needs to be held for surgery        Other   Pre-op exam - Primary    No h/o drug allergies or anesthesia intolerance No active cardiac or pulmonary problems and HTN as well as glucose are controlled  Stable EKG Prednisone use (pt will check with rheumatology to see if she should stop for surgery) places pt at mild risk of non healing  UTD with vaccines  Will send paperwork to surgeon        Relevant Orders   EKG 12-Lead (Completed)

## 2020-06-01 NOTE — Assessment & Plan Note (Signed)
Doing well with med management  Cholesterol and bp control

## 2020-06-01 NOTE — Assessment & Plan Note (Signed)
Taking methotrexate and Humira and prednisone She will check with rheumatologist to see if anything needs to be held for surgery

## 2020-06-01 NOTE — Assessment & Plan Note (Signed)
Lab Results  Component Value Date   HGBA1C 6.3 01/30/2020   In prediabetic range  Diet controlled

## 2020-06-01 NOTE — Assessment & Plan Note (Addendum)
No h/o drug allergies or anesthesia intolerance No active cardiac or pulmonary problems and HTN as well as glucose are controlled  Stable EKG Prednisone use (pt will check with rheumatology to see if she should stop for surgery) places pt at mild risk of non healing  Obesity increases op risk slightly as well UTD with vaccines  Will send paperwork to surgeon

## 2020-06-08 ENCOUNTER — Encounter: Payer: Self-pay | Admitting: Gynecology

## 2020-07-02 ENCOUNTER — Other Ambulatory Visit: Payer: Self-pay

## 2020-07-02 ENCOUNTER — Encounter (HOSPITAL_BASED_OUTPATIENT_CLINIC_OR_DEPARTMENT_OTHER): Payer: Self-pay | Admitting: Obstetrics and Gynecology

## 2020-07-02 NOTE — Progress Notes (Signed)
Spoke w/ via phone for pre-op interview---PT Lab needs dos----  I STAT 8, T & S             Lab results------EKG 06-01-2020 CHART/EPIC COVID test ------07-03-2020 AT 1500 Arrive at -------530 AM 07-06-2020 NPO after MN NO Solid Food.  Clear liquids from MN until---430 AM THEN NPO Medications to take morning of surgery -----AMLODIPINE ID DUE, LORATADINE, ALBUTEROL INHALER PRN/BRING INHALER, PREDNISONE, FLONASE, PROTONIX Diabetic medication -----N/A Patient Special Instructions -----NONE Pre-Op special Istructions -----NONE Patient verbalized understanding of instructions that were given at this phone interview. Patient denies shortness of breath, chest pain, fever, cough at this phone interview.  MEDICAL CLEARANCE NOTE DR Roque Lias TOWER 05-27-12-2021 CHART/EPIC

## 2020-07-03 ENCOUNTER — Encounter: Payer: Medicare Other | Admitting: Obstetrics & Gynecology

## 2020-07-03 ENCOUNTER — Other Ambulatory Visit (HOSPITAL_COMMUNITY)
Admission: RE | Admit: 2020-07-03 | Discharge: 2020-07-03 | Disposition: A | Discharge status: Home / Self Care | Payer: Medicare Other | Source: Ambulatory Visit | Attending: Obstetrics and Gynecology | Admitting: Obstetrics and Gynecology

## 2020-07-03 DIAGNOSIS — Z01812 Encounter for preprocedural laboratory examination: Secondary | ICD-10-CM | POA: Diagnosis present

## 2020-07-03 DIAGNOSIS — Z20822 Contact with and (suspected) exposure to covid-19: Secondary | ICD-10-CM | POA: Diagnosis not present

## 2020-07-04 LAB — SARS CORONAVIRUS 2 (TAT 6-24 HRS): SARS Coronavirus 2: NEGATIVE

## 2020-07-05 NOTE — Anesthesia Preprocedure Evaluation (Addendum)
Anesthesia Evaluation  Patient identified by MRN, date of birth, ID band Patient awake    Reviewed: Allergy & Precautions, NPO status , Patient's Chart, lab work & pertinent test results  Airway Mallampati: II  TM Distance: >3 FB Neck ROM: Full    Dental no notable dental hx.    Pulmonary neg pulmonary ROS, former smoker,    Pulmonary exam normal breath sounds clear to auscultation       Cardiovascular hypertension, Normal cardiovascular exam Rhythm:Regular Rate:Normal     Neuro/Psych negative neurological ROS  negative psych ROS   GI/Hepatic negative GI ROS, Neg liver ROS,   Endo/Other  diabetes  Renal/GU negative Renal ROS  negative genitourinary   Musculoskeletal  (+) Arthritis , Rheumatoid disorders,    Abdominal   Peds negative pediatric ROS (+)  Hematology negative hematology ROS (+)   Anesthesia Other Findings   Reproductive/Obstetrics negative OB ROS                            Anesthesia Physical Anesthesia Plan  ASA: II  Anesthesia Plan: General   Post-op Pain Management:    Induction: Intravenous  PONV Risk Score and Plan: 3 and Ondansetron, Dexamethasone, Midazolam and Treatment may vary due to age or medical condition  Airway Management Planned: LMA  Additional Equipment:   Intra-op Plan:   Post-operative Plan: Extubation in OR  Informed Consent: I have reviewed the patients History and Physical, chart, labs and discussed the procedure including the risks, benefits and alternatives for the proposed anesthesia with the patient or authorized representative who has indicated his/her understanding and acceptance.     Dental advisory given  Plan Discussed with: CRNA and Surgeon  Anesthesia Plan Comments:         Anesthesia Quick Evaluation

## 2020-07-06 ENCOUNTER — Encounter (HOSPITAL_BASED_OUTPATIENT_CLINIC_OR_DEPARTMENT_OTHER): Payer: Self-pay | Admitting: Obstetrics and Gynecology

## 2020-07-06 ENCOUNTER — Ambulatory Visit (HOSPITAL_BASED_OUTPATIENT_CLINIC_OR_DEPARTMENT_OTHER)
Admission: RE | Admit: 2020-07-06 | Discharge: 2020-07-06 | Disposition: A | Discharge status: Home / Self Care | Payer: Medicare Other | Attending: Obstetrics and Gynecology | Admitting: Obstetrics and Gynecology

## 2020-07-06 ENCOUNTER — Ambulatory Visit (HOSPITAL_BASED_OUTPATIENT_CLINIC_OR_DEPARTMENT_OTHER): Payer: Medicare Other | Admitting: Anesthesiology

## 2020-07-06 ENCOUNTER — Encounter (HOSPITAL_BASED_OUTPATIENT_CLINIC_OR_DEPARTMENT_OTHER)
Admission: RE | Disposition: A | Discharge status: Home / Self Care | Payer: Self-pay | Source: Home / Self Care | Attending: Obstetrics and Gynecology

## 2020-07-06 DIAGNOSIS — I10 Essential (primary) hypertension: Secondary | ICD-10-CM | POA: Insufficient documentation

## 2020-07-06 DIAGNOSIS — M069 Rheumatoid arthritis, unspecified: Secondary | ICD-10-CM | POA: Diagnosis not present

## 2020-07-06 DIAGNOSIS — Z86010 Personal history of colonic polyps: Secondary | ICD-10-CM | POA: Insufficient documentation

## 2020-07-06 DIAGNOSIS — K219 Gastro-esophageal reflux disease without esophagitis: Secondary | ICD-10-CM | POA: Diagnosis not present

## 2020-07-06 DIAGNOSIS — Z9841 Cataract extraction status, right eye: Secondary | ICD-10-CM | POA: Diagnosis not present

## 2020-07-06 DIAGNOSIS — E119 Type 2 diabetes mellitus without complications: Secondary | ICD-10-CM | POA: Diagnosis not present

## 2020-07-06 DIAGNOSIS — Z79899 Other long term (current) drug therapy: Secondary | ICD-10-CM | POA: Insufficient documentation

## 2020-07-06 DIAGNOSIS — Z87442 Personal history of urinary calculi: Secondary | ICD-10-CM | POA: Diagnosis not present

## 2020-07-06 DIAGNOSIS — Z91030 Bee allergy status: Secondary | ICD-10-CM | POA: Insufficient documentation

## 2020-07-06 DIAGNOSIS — R9389 Abnormal findings on diagnostic imaging of other specified body structures: Secondary | ICD-10-CM | POA: Insufficient documentation

## 2020-07-06 DIAGNOSIS — Z7951 Long term (current) use of inhaled steroids: Secondary | ICD-10-CM | POA: Diagnosis not present

## 2020-07-06 DIAGNOSIS — N950 Postmenopausal bleeding: Secondary | ICD-10-CM | POA: Insufficient documentation

## 2020-07-06 DIAGNOSIS — E1165 Type 2 diabetes mellitus with hyperglycemia: Secondary | ICD-10-CM | POA: Diagnosis not present

## 2020-07-06 DIAGNOSIS — M1990 Unspecified osteoarthritis, unspecified site: Secondary | ICD-10-CM | POA: Insufficient documentation

## 2020-07-06 DIAGNOSIS — Z87891 Personal history of nicotine dependence: Secondary | ICD-10-CM | POA: Diagnosis not present

## 2020-07-06 DIAGNOSIS — Z888 Allergy status to other drugs, medicaments and biological substances status: Secondary | ICD-10-CM | POA: Diagnosis not present

## 2020-07-06 DIAGNOSIS — Z9049 Acquired absence of other specified parts of digestive tract: Secondary | ICD-10-CM | POA: Diagnosis not present

## 2020-07-06 DIAGNOSIS — Z9842 Cataract extraction status, left eye: Secondary | ICD-10-CM | POA: Diagnosis not present

## 2020-07-06 DIAGNOSIS — E785 Hyperlipidemia, unspecified: Secondary | ICD-10-CM | POA: Diagnosis not present

## 2020-07-06 DIAGNOSIS — K760 Fatty (change of) liver, not elsewhere classified: Secondary | ICD-10-CM | POA: Insufficient documentation

## 2020-07-06 DIAGNOSIS — R011 Cardiac murmur, unspecified: Secondary | ICD-10-CM | POA: Diagnosis not present

## 2020-07-06 DIAGNOSIS — Z8616 Personal history of COVID-19: Secondary | ICD-10-CM | POA: Diagnosis not present

## 2020-07-06 HISTORY — PX: DILATATION & CURETTAGE/HYSTEROSCOPY WITH MYOSURE: SHX6511

## 2020-07-06 HISTORY — DX: Postmenopausal bleeding: N95.0

## 2020-07-06 HISTORY — DX: Prediabetes: R73.03

## 2020-07-06 HISTORY — DX: Personal history of urinary calculi: Z87.442

## 2020-07-06 LAB — POCT I-STAT, CHEM 8
BUN: 7 mg/dL — ABNORMAL LOW (ref 8–23)
Calcium, Ion: 1.24 mmol/L (ref 1.15–1.40)
Chloride: 104 mmol/L (ref 98–111)
Creatinine, Ser: 0.7 mg/dL (ref 0.44–1.00)
Glucose, Bld: 121 mg/dL — ABNORMAL HIGH (ref 70–99)
HCT: 42 % (ref 36.0–46.0)
Hemoglobin: 14.3 g/dL (ref 12.0–15.0)
Potassium: 3.6 mmol/L (ref 3.5–5.1)
Sodium: 142 mmol/L (ref 135–145)
TCO2: 24 mmol/L (ref 22–32)

## 2020-07-06 SURGERY — DILATATION & CURETTAGE/HYSTEROSCOPY WITH MYOSURE
Anesthesia: General | Site: Vagina

## 2020-07-06 MED ORDER — SODIUM CHLORIDE 0.9% FLUSH: 3.0000 mL | Freq: Two times a day (BID) | INTRAVENOUS | Status: DC

## 2020-07-06 MED ORDER — SILVER NITRATE-POT NITRATE 75-25 % EX MISC
CUTANEOUS | Status: DC | PRN
  Administered 2020-07-06: 2

## 2020-07-06 MED ORDER — SODIUM CHLORIDE 0.9 % IR SOLN
Status: DC | PRN
  Administered 2020-07-06: 3000 mL

## 2020-07-06 MED ORDER — ONDANSETRON HCL 4 MG/2ML IJ SOLN
INTRAMUSCULAR | Status: AC
  Filled 2020-07-06: qty 2

## 2020-07-06 MED ORDER — LIDOCAINE 2% (20 MG/ML) 5 ML SYRINGE
INTRAMUSCULAR | Status: DC | PRN
  Administered 2020-07-06: 80 mg via INTRAVENOUS

## 2020-07-06 MED ORDER — IBUPROFEN 200 MG PO TABS
400.0000 mg | ORAL_TABLET | Freq: Four times a day (QID) | ORAL | Status: DC | PRN
  Administered 2020-07-06: 400 mg via ORAL

## 2020-07-06 MED ORDER — PROPOFOL 10 MG/ML IV BOLUS
INTRAVENOUS | Status: DC | PRN
  Administered 2020-07-06: 140 mg via INTRAVENOUS

## 2020-07-06 MED ORDER — DEXAMETHASONE SODIUM PHOSPHATE 10 MG/ML IJ SOLN
INTRAMUSCULAR | Status: AC
  Filled 2020-07-06: qty 1

## 2020-07-06 MED ORDER — LACTATED RINGERS IV SOLN: INTRAVENOUS | Status: DC

## 2020-07-06 MED ORDER — IBUPROFEN 200 MG PO TABS
ORAL_TABLET | ORAL | Status: AC
  Filled 2020-07-06: qty 2

## 2020-07-06 MED ORDER — LIDOCAINE 2% (20 MG/ML) 5 ML SYRINGE
INTRAMUSCULAR | Status: AC
  Filled 2020-07-06: qty 5

## 2020-07-06 MED ORDER — DEXAMETHASONE SODIUM PHOSPHATE 10 MG/ML IJ SOLN
INTRAMUSCULAR | Status: DC | PRN
  Administered 2020-07-06: 10 mg via INTRAVENOUS

## 2020-07-06 MED ORDER — PROPOFOL 10 MG/ML IV BOLUS
INTRAVENOUS | Status: AC
  Filled 2020-07-06: qty 20

## 2020-07-06 MED ORDER — POVIDONE-IODINE 10 % EX SWAB: 2.0000 "application " | Freq: Once | CUTANEOUS | Status: DC

## 2020-07-06 MED ORDER — BUPIVACAINE HCL 0.25 % IJ SOLN
10.0000 mL | Freq: Once | INTRAMUSCULAR | Status: AC
  Administered 2020-07-06: 10 mL
  Filled 2020-07-06: qty 10

## 2020-07-06 MED ORDER — FENTANYL CITRATE (PF) 100 MCG/2ML IJ SOLN
INTRAMUSCULAR | Status: DC | PRN
Start: 2020-07-06 — End: 2020-07-06
  Administered 2020-07-06: 25 ug via INTRAVENOUS
  Administered 2020-07-06: 50 ug via INTRAVENOUS
  Administered 2020-07-06: 25 ug via INTRAVENOUS

## 2020-07-06 MED ORDER — FENTANYL CITRATE (PF) 100 MCG/2ML IJ SOLN
INTRAMUSCULAR | Status: AC
  Filled 2020-07-06: qty 2

## 2020-07-06 MED ORDER — ONDANSETRON HCL 4 MG/2ML IJ SOLN
INTRAMUSCULAR | Status: DC | PRN
  Administered 2020-07-06: 4 mg via INTRAVENOUS

## 2020-07-06 MED ORDER — ACETAMINOPHEN 500 MG PO TABS: 1000.0000 mg | ORAL_TABLET | Freq: Four times a day (QID) | ORAL | Status: DC | PRN

## 2020-07-06 SURGICAL SUPPLY — 21 items
CATH ROBINSON RED A/P 16FR (CATHETERS) ×2 IMPLANT
COVER WAND RF STERILE (DRAPES) ×2 IMPLANT
DEVICE MYOSURE LITE (MISCELLANEOUS) IMPLANT
DEVICE MYOSURE REACH (MISCELLANEOUS) IMPLANT
DILATOR CANAL MILEX (MISCELLANEOUS) IMPLANT
ELECT REM PT RETURN 9FT ADLT (ELECTROSURGICAL)
ELECTRODE REM PT RTRN 9FT ADLT (ELECTROSURGICAL) IMPLANT
GAUZE 4X4 16PLY RFD (DISPOSABLE) ×2 IMPLANT
GLOVE BIO SURGEON STRL SZ8 (GLOVE) ×2 IMPLANT
GLOVE BIOGEL PI IND STRL 8 (GLOVE) ×2 IMPLANT
GLOVE BIOGEL PI INDICATOR 8 (GLOVE) ×2
GOWN STRL REUS W/TWL XL LVL3 (GOWN DISPOSABLE) ×2 IMPLANT
IV NS IRRIG 3000ML ARTHROMATIC (IV SOLUTION) ×4 IMPLANT
KIT PROCEDURE FLUENT (KITS) ×2 IMPLANT
MYOSURE XL FIBROID (MISCELLANEOUS)
PACK VAGINAL MINOR WOMEN LF (CUSTOM PROCEDURE TRAY) ×2 IMPLANT
PAD OB MATERNITY 4.3X12.25 (PERSONAL CARE ITEMS) ×2 IMPLANT
PAD PREP 24X48 CUFFED NSTRL (MISCELLANEOUS) ×2 IMPLANT
SEAL CERVICAL OMNI LOK (ABLATOR) IMPLANT
SEAL ROD LENS SCOPE MYOSURE (ABLATOR) ×2 IMPLANT
SYSTEM TISS REMOVAL MYOSURE XL (MISCELLANEOUS) IMPLANT

## 2020-07-06 NOTE — Op Note (Addendum)
Name: [name]  Age: 79 y.o.  Date of Birth: 02/21/1941 Medical Record #: [id number]   Operative Note   Preoperative Diagnosis: Post menopausal bleeding, endometrial thickening, insufficient office endometrial biopsy Procedure: Hysteroscopy, Dilatation and Curettage Postoperative Diagnosis: same Surgeon: Joseph Pierini, MD Estimated Blood Loss: 5 mL Anesthesia: General LMA, local with 0.25% bupivacaine (10 mL) Specimens: Endometrial curettings Findings: Normal atropic endometrial cavity with bilateral fallopian tube ostia obscured by atrophy/atrophic endometrium.  Normal cavity contour without submucosal fibroids, no polyps. Endocervical canal normal. Uterus sounds to 7 cm.  Hysteroscopic fluid deficit minimal. Complication: none. Date: 07/06/20     DESCRIPTION OF PROCEDURE:      Preoperative review of the procedure was completed with the patient prior to transport to the operating room including risks, benefits, and alternatives.  The patient's questions were answered and she agreed to proceed.    Under anesthesia, [name] was placed in the dorsolithotomy position with legs in yellowfin stirrups with SCDs applied and running.  A surgical team time out was performed to verify and agree on procedure and patient consent.  A bimanual exam was performed.  The patient was prepped and draped in the usual sterile fashion.    The bladder was drained using aseptic technique and a red Robinson catheter.    Cervix was visualized with an open sided speculum.  A paracervical block was applied in the standard fashion using 0.25% Marcaine. The anterior cervix was grasped with a single-toothed tenaculum. The uterine descensus was noted to be poor.  The uterine cavity was very gently sounded to establish a measurement of uterine cavity depth.  Cervix was gently dilated using a progressive series of Pratt dilators to size 17.  There was no concern for uterine or cervical perforation.    The  Myosure hysteroscope was introduced and the uterine cavity was visualized.  No intrauterine pathology was encountered.  The cervix was gently dilated a second time to size 19 allow introduction of #2 curettage instrument.  A thorough curettage was productive of scant endometrial tissue.  The curettage was effective at achieving a uterine cry circumferentially.  The hysteroscope was introduced into the endometrial cavity again to ensure adequate sampling.  The endometrial curettings were sent to pathology for review.  Tenaculum was removed from cervix and one of the puncture sites was bleeding.  Application of pressure and silver nitrate cautery resulted in hemostasis.     Sponge and instrument counts were correct at the conclusion of the procedure. The patient tolerated the procedure well and was brought to the recovery room in stable condition.      Joseph Pierini, M.D., Cherlynn June

## 2020-07-06 NOTE — Transfer of Care (Signed)
Immediate Anesthesia Transfer of Care Note  Patient: [name]  Procedure(s) Performed: DILATATION & CURETTAGE/HYSTEROSCOPY WITH POSSIBLE MYOSURE (N/A Vagina )  Patient Location: PACU  Anesthesia Type:General  Level of Consciousness: awake, alert  and oriented  Airway & Oxygen Therapy: Patient Spontanous Breathing and Patient connected to nasal cannula oxygen  Post-op Assessment: Report given to RN and Post -op Vital signs reviewed and stable  Post vital signs: Reviewed and stable  Last Vitals:  Vitals Value Taken Time  BP    Temp    Pulse 74 07/06/20 0808  Resp 15 07/06/20 0808  SpO2 98 % 07/06/20 0808  Vitals shown include unvalidated device data.  Last Pain:  Vitals:   07/06/20 0557  TempSrc:   PainSc: 7       Patients Stated Pain Goal: 4 (97/35/32 9924)  Complications: No complications documented.

## 2020-07-06 NOTE — H&P (Signed)
[name] August 29, 1941 MRN: [id number]  HPI The patient is a 79 y.o. G3P3003 who presents today for scheduled hysteroscopy D&C to work-up postmenopausal bleeding with thickened endometrium identified on ultrasound (6 mm thickness), office biopsy revealed insufficient tissue.  No changes to her medical history since her pre op exam, denies CP, SOB, fever/chills, dysuria.   Physical Exam   BP (!) 176/91   Pulse 87   Temp 98.7 F (37.1 C) (Oral)   Resp 16   Ht 5\' 7"  (1.702 m)   Wt 88.8 kg   SpO2 98%   BMI 30.67 kg/m    Past Medical History:  Diagnosis Date  . Allergic rhinitis   . Cataract 2015   bilateral  . COVID-19 10/2019   ASYMPTOMATIC  . Diverticulosis   . Esophageal reflux   . Essential hypertension 01/07/2016  . Fatty liver 2018  . Fibula fracture 02/2005  . Former smoker   . Gastritis 2003  . Heart murmur    MILD, NO CARDIOLOGIST  . Hemorrhoids   . History of cardiac dysrhythmia 2011   ABLATION  AT [facility]  . History of kidney stones YRS AGO  . Hyperglycemia   . Hyperlipemia   . Osteoarthritis   . Personal history of colonic polyps 1998   hyperplastic  . PMB (postmenopausal bleeding)   . Pre-diabetes    BORDERLINE DIET CONTROLLED DOES NOT CHECK CBG  . RA (rheumatoid arthritis) ([place])   . Rectal bleed 2018   TRANSFUSION GIVEN  . Shingles 2012  . Uterine fibroid     Past Surgical History:  Procedure Laterality Date  . CHOLECYSTECTOMY  YRS AGO  . COLONOSCOPY  11/08   internal hemorrhoids  . EP procedure  1994   SVT; normal echo  . ESOPHAGOGASTRODUODENOSCOPY  11/03   reactive gastropathy  . EYE SURGERY Bilateral 2017   CATARACTS  . LIPOMA EXCISION  11/2002  . SVT s/p surgery--? ablation  2011   AT [facility]  . TUBAL LIGATION  YRS AGO  . vaginal polyp excised  12/2000   benign   Allergies  Allergen Reactions  . Bee Venom Anaphylaxis  . Humira [Adalimumab]     LIPS SWELLED AND RASH   No current facility-administered medications on file prior  to encounter.   Current Outpatient Medications on File Prior to Encounter  Medication Sig Dispense Refill  . acetaminophen (TYLENOL) 325 MG tablet Take 650 mg by mouth every 6 (six) hours as needed.    Marland Kitchen albuterol (PROVENTIL HFA;VENTOLIN HFA) 108 (90 Base) MCG/ACT inhaler Inhale 2 puffs into the lungs every 4 (four) hours as needed for wheezing or shortness of breath. Please give spacer if possible 1 Inhaler 3  . amLODipine (NORVASC) 5 MG tablet TAKE 1 TABLET BY MOUTH EVERY DAY (Patient taking differently: every other day. ) 90 tablet 0  . atorvastatin (LIPITOR) 10 MG tablet TAKE 0.5 TABLETS (5 MG TOTAL) BY MOUTH EVERY OTHER DAY. (Patient taking differently: Take 5 mg by mouth every other day. TAKES AT HS) 23 tablet 0  . Calcium & Magnesium Carbonates (MYLANTA PO) Take 1 capsule by mouth as directed.      . cyclobenzaprine (FLEXERIL) 5 MG tablet 3 (three) times daily as needed.     . docusate sodium (COLACE) 100 MG capsule Take 1 capsule (100 mg total) by mouth 2 (two) times daily. 60 capsule 0  . fluticasone (FLONASE) 50 MCG/ACT nasal spray PLACE 2 SPRAYS INTO THE NOSE DAILY. (Patient taking differently: as needed. )  48 g 3  . folic acid (FOLVITE) 1 MG tablet Take 1 mg by mouth daily.    Marland Kitchen loratadine (CLARITIN) 10 MG tablet Take 10 mg by mouth daily. MAY TAKE AT HS ALSO    . methotrexate (RHEUMATREX) 2.5 MG tablet Take 15 mg by mouth once a week. Caution:Chemotherapy. Protect from light. ( Monday of each week )    . pantoprazole (PROTONIX) 40 MG tablet Take 40 mg by mouth daily.  2  . polyethylene glycol (MIRALAX / GLYCOLAX) packet Take 17 g by mouth daily.    . predniSONE (DELTASONE) 5 MG tablet Take 5 mg by mouth daily.    Marland Kitchen EPINEPHrine (EPIPEN IJ) Inject as directed as needed. Reported on 12/05/2015       General: Pleasant female, no acute distress, alert and oriented CV: RRR, no murmurs Pulm: good respiratory effort, CTAB     Plan Proceed with hysteroscopy D&C as planned.  All  questions answered and the patient agrees to proceed.   Joseph Pierini, MD 07/06/20

## 2020-07-06 NOTE — Anesthesia Postprocedure Evaluation (Signed)
Anesthesia Post Note  Patient: [name]  Procedure(s) Performed: DILATATION & CURETTAGE/HYSTEROSCOPY WITH POSSIBLE MYOSURE (N/A Vagina )     Patient location during evaluation: PACU Anesthesia Type: General Level of consciousness: awake and alert Pain management: pain level controlled Vital Signs Assessment: post-procedure vital signs reviewed and stable Respiratory status: spontaneous breathing, nonlabored ventilation, respiratory function stable and patient connected to nasal cannula oxygen Cardiovascular status: blood pressure returned to baseline and stable Postop Assessment: no apparent nausea or vomiting Anesthetic complications: no   No complications documented.  Last Vitals:  Vitals:   07/06/20 0945 07/06/20 1045  BP:  (!) 154/73  Pulse: 70 71  Resp: 14 16  Temp:  (!) 36.4 C  SpO2: 94% 95%    Last Pain:  Vitals:   07/06/20 1006  TempSrc:   PainSc: 2                  Alissa Pharr S

## 2020-07-06 NOTE — Anesthesia Procedure Notes (Signed)
Procedure Name: LMA Insertion Date/Time: 07/06/2020 7:33 AM Performed by: Nakeyia Menden D, CRNA Pre-anesthesia Checklist: Patient identified, Emergency Drugs available, Suction available and Patient being monitored Patient Re-evaluated:Patient Re-evaluated prior to induction Oxygen Delivery Method: Circle system utilized Preoxygenation: Pre-oxygenation with 100% oxygen Induction Type: IV induction Ventilation: Mask ventilation without difficulty LMA: LMA inserted LMA Size: 4.0 Tube type: Oral Number of attempts: 1 Placement Confirmation: positive ETCO2 and breath sounds checked- equal and bilateral Tube secured with: Tape Dental Injury: Teeth and Oropharynx as per pre-operative assessment

## 2020-07-06 NOTE — Discharge Instructions (Signed)
DISCHARGE INSTRUCTIONS: D&C The following instructions have been prepared to help you care for yourself upon your return home.   Personal hygiene:  Use sanitary pads for vaginal drainage, not tampons.  Shower the day after your procedure.  NO tub baths, pools or Jacuzzis for 2-3 weeks.  Wipe front to back after using the bathroom.  Activity and limitations:  Do NOT drive or operate any equipment for 24 hours. The effects of anesthesia are still present and drowsiness may result.  Do NOT rest in bed all day.  Walking is encouraged.  Walk up and down stairs slowly.  You may resume your normal activity in one to two days or as indicated by your physician.  Sexual activity: NO intercourse for at least 2 weeks after the procedure, or as indicated by your physician.  Diet: Eat a light meal as desired this evening. You may resume your usual diet tomorrow.  Return to work: You may resume your work activities in one to two days or as indicated by your doctor.  What to expect after your surgery: Expect to have vaginal bleeding/discharge for 2-3 days and spotting for up to 10 days. It is not unusual to have soreness for up to 1-2 weeks. You may have a slight burning sensation when you urinate for the first day. Mild cramps may continue for a couple of days. You may have a regular period in 2-6 weeks.  Call your doctor for any of the following:  Excessive vaginal bleeding, saturating and changing one pad every hour.  Inability to urinate 6 hours after discharge from hospital.  Pain not relieved by pain medication.  Fever of 100.4 F or greater.  Unusual vaginal discharge or odor.   Post Anesthesia Home Care Instructions  Activity: Get plenty of rest for the remainder of the day. A responsible individual must stay with you for 24 hours following the procedure.  For the next 24 hours, DO NOT: -Drive a car -Operate machinery -Drink alcoholic beverages -Take any medication unless instructed  by your physician -Make any legal decisions or sign important papers.  Meals: Start with liquid foods such as gelatin or soup. Progress to regular foods as tolerated. Avoid greasy, spicy, heavy foods. If nausea and/or vomiting occur, drink only clear liquids until the nausea and/or vomiting subsides. Call your physician if vomiting continues.  Special Instructions/Symptoms: Your throat may feel dry or sore from the anesthesia or the breathing tube placed in your throat during surgery. If this causes discomfort, gargle with warm salt water. The discomfort should disappear within 24 hours.         

## 2020-07-09 LAB — SURGICAL PATHOLOGY

## 2020-07-10 ENCOUNTER — Other Ambulatory Visit: Payer: Medicare Other

## 2020-07-10 ENCOUNTER — Encounter (HOSPITAL_BASED_OUTPATIENT_CLINIC_OR_DEPARTMENT_OTHER): Payer: Self-pay | Admitting: Obstetrics and Gynecology

## 2020-07-17 ENCOUNTER — Encounter: Payer: Medicare Other | Admitting: Family Medicine

## 2020-07-20 ENCOUNTER — Ambulatory Visit (INDEPENDENT_AMBULATORY_CARE_PROVIDER_SITE_OTHER): Payer: Medicare Other | Admitting: Obstetrics and Gynecology

## 2020-07-20 ENCOUNTER — Encounter: Payer: Self-pay | Admitting: Obstetrics and Gynecology

## 2020-07-20 ENCOUNTER — Other Ambulatory Visit: Payer: Self-pay

## 2020-07-20 VITALS — BP 124/80

## 2020-07-20 DIAGNOSIS — Z9889 Other specified postprocedural states: Secondary | ICD-10-CM

## 2020-07-20 NOTE — Progress Notes (Signed)
   [place] August 28, 1941 [id number]  [place] Chief Complaint  Patient presents with  . Post Op check     HISTORY OF PRESENT ILLNESS [name] presents for routine 2 week post-operative follow up after a hysteroscopy dilation curettage performed on 07/06/2020 for postmenopausal bleeding work-up, thickened endometrium, insufficient office biopsy.  The patient is doing well with no concerns.  She denies discharge.  Vaginal spotting is tapering off since the procedure to almost nothing at this point.  She is tolerating normal diet.  Bowel and bladder function are normal.  She is still having some migrating lower abdominal pains that were present prior to the procedure.  She notes these discomforts are definitely dietary related and certain foods such as greasy foods do make it worse.  OBJECTIVE  BP 124/80    PHYSICAL EXAM General:  Patient in no acute distress.  Abdomen: Soft, non tender, non-distended.   ASSESSMENT / PLAN  Routine 2 week post operative check.   The patient is doing well and meeting all postoperative milestones.  I reviewed the benign pathology report indicating and confirming atrophic endometrium.  I showed her some pictures taken during the hysteroscopy.  She may return to normal activities without restriction and resume normal gynecologic care at this point.  I did remind her that I want to check in on the small left ovarian cyst/ovary enlargement with a repeat ultrasound in another 2 to 3 months.  I do not think the ovary is causing her abdominal symptoms.  We did check a CA-125 and 04/2020 and it was well within normal range.  She will plan to make that appointment at checkout today.   Joseph Pierini MD 07/20/20

## 2020-08-06 DIAGNOSIS — M150 Primary generalized (osteo)arthritis: Secondary | ICD-10-CM | POA: Diagnosis not present

## 2020-08-06 DIAGNOSIS — M0579 Rheumatoid arthritis with rheumatoid factor of multiple sites without organ or systems involvement: Secondary | ICD-10-CM | POA: Diagnosis not present

## 2020-08-10 ENCOUNTER — Other Ambulatory Visit: Payer: Self-pay | Admitting: Family Medicine

## 2020-08-14 ENCOUNTER — Ambulatory Visit (INDEPENDENT_AMBULATORY_CARE_PROVIDER_SITE_OTHER): Payer: Medicare Other

## 2020-08-14 ENCOUNTER — Other Ambulatory Visit: Payer: Self-pay

## 2020-08-14 DIAGNOSIS — Z23 Encounter for immunization: Secondary | ICD-10-CM

## 2020-08-15 ENCOUNTER — Ambulatory Visit: Payer: Medicare Other

## 2020-09-26 ENCOUNTER — Ambulatory Visit: Payer: Medicare Other | Admitting: Obstetrics and Gynecology

## 2020-09-26 ENCOUNTER — Other Ambulatory Visit: Payer: Self-pay

## 2020-09-26 ENCOUNTER — Ambulatory Visit (INDEPENDENT_AMBULATORY_CARE_PROVIDER_SITE_OTHER): Payer: Medicare Other

## 2020-09-26 VITALS — BP 124/80

## 2020-09-26 DIAGNOSIS — N838 Other noninflammatory disorders of ovary, fallopian tube and broad ligament: Secondary | ICD-10-CM | POA: Diagnosis not present

## 2020-09-26 DIAGNOSIS — N854 Malposition of uterus: Secondary | ICD-10-CM

## 2020-09-26 DIAGNOSIS — R109 Unspecified abdominal pain: Secondary | ICD-10-CM | POA: Diagnosis not present

## 2020-09-26 DIAGNOSIS — N950 Postmenopausal bleeding: Secondary | ICD-10-CM | POA: Diagnosis not present

## 2020-09-26 DIAGNOSIS — R9389 Abnormal findings on diagnostic imaging of other specified body structures: Secondary | ICD-10-CM

## 2020-09-26 DIAGNOSIS — D259 Leiomyoma of uterus, unspecified: Secondary | ICD-10-CM

## 2020-09-26 NOTE — Progress Notes (Signed)
   [name] [facility] 05-11-1941 [id number]  SUBJECTIVE:  79 y.o. G3P3003 female presents for follow-up pelvic ultrasound from 04/2020 where the ultrasound at that time performed for postmenopausal bleeding had indicated incidental finding of a left ovarian enlargement but neither ovary was well defined on that image.  She was told to follow-up for repeat pelvic ultrasound imaging which is why she is here today.  She is not having any abdominal pain or vaginal bleeding.  OBJECTIVE:  BP 124/80   Pelvic ultrasound Anteverted uterus 6.8 x 4.0 x 4.5 cm. Multiple small fibroids stable in appearance. Symmetrical endometrium 0.85 mm. No masses or thickening. Bilateral ovaries are better visualized today versus previous examination.  Left ovary with residual 6 mm avascular simple follicle.  Right ovary normal. No adnexal masses.  No free fluid.   ASSESSMENT:  79 y.o. G3P3003 here for follow-up pelvic ultrasound  PLAN:  Patient is reassured by the normal appearance of the pelvic organs on today's exam, stable fibroid uterus, no events of ovarian large minute or concerning cyst.  No specific future pelvic ultrasound follow-up needed at this time unless other symptoms develop.  She can return for her normal routine annual exam or follow-up sooner if any concerns arise.   Joseph Pierini MD 09/26/20

## 2020-10-12 ENCOUNTER — Other Ambulatory Visit: Payer: Self-pay | Admitting: Family Medicine

## 2020-10-12 DIAGNOSIS — E7800 Pure hypercholesterolemia, unspecified: Secondary | ICD-10-CM

## 2020-10-12 DIAGNOSIS — R7303 Prediabetes: Secondary | ICD-10-CM

## 2020-10-12 DIAGNOSIS — I10 Essential (primary) hypertension: Secondary | ICD-10-CM

## 2020-10-15 DIAGNOSIS — E785 Hyperlipidemia, unspecified: Secondary | ICD-10-CM | POA: Insufficient documentation

## 2020-10-15 NOTE — Telephone Encounter (Signed)
I sent short refill   Please schedule PE with lab prior I put lab order in

## 2020-10-15 NOTE — Telephone Encounter (Signed)
Pt hasn't had any lipid labs in over a year and no future appts., please advise

## 2020-10-15 NOTE — Telephone Encounter (Signed)
Per Morey Hummingbird:  Patient scheduled cpx w/ Dr.Tower on 11/13/20. She has a hard time with transportation because her husband isn't able to drive,yet. She prefers labs done same day as cpx.

## 2020-10-16 ENCOUNTER — Telehealth: Payer: Self-pay | Admitting: Family Medicine

## 2020-10-16 NOTE — Chronic Care Management (AMB) (Signed)
°  Chronic Care Management   Note  10/16/2020 Name: DAYANIRA GIOVANNETTI MRN: [id number] DOB: 1941/06/12  KANON COLUNGA is a 79 y.o. year old female who is a primary care patient of Tower, Audrie Gallus, MD. I reached out to [name] by phone today in response to a referral sent by Ms. Lorelle Formosa Brilliant's PCP, Tower, Audrie Gallus, MD.   Ms. Pettaway was given information about Chronic Care Management services today including:  1. CCM service includes personalized support from designated clinical staff supervised by her physician, including individualized plan of care and coordination with other care providers 2. 24/7 contact phone numbers for assistance for urgent and routine care needs. 3. Service will only be billed when office clinical staff spend 20 minutes or more in a month to coordinate care. 4. Only one practitioner may furnish and bill the service in a calendar month. 5. The patient may stop CCM services at any time (effective at the end of the month) by phone call to the office staff.   Patient agreed to services and verbal consent obtained.   Follow up plan:   Aggie Hacker  [employer] Scheduler

## 2020-11-09 ENCOUNTER — Ambulatory Visit (INDEPENDENT_AMBULATORY_CARE_PROVIDER_SITE_OTHER): Payer: Medicare Other

## 2020-11-09 DIAGNOSIS — Z0000 Encounter for general adult medical examination without abnormal findings: Secondary | ICD-10-CM | POA: Diagnosis not present

## 2020-11-09 NOTE — Progress Notes (Signed)
PCP notes:  Health Maintenance: Mammogram- due Dexa- due Foot exam- due   Abnormal Screenings: none   Patient concerns: Needs prescription for blood pressure monitor to take to her pharmacy    Nurse concerns: none   Next PCP appt.: 11/13/2020 @ 11:30 am

## 2020-11-09 NOTE — Progress Notes (Signed)
Subjective:   [name] is a 80 y.o. female who presents for Medicare Annual (Subsequent) preventive examination.  Review of Systems: N/A      I connected with the patient today by telephone and verified that I am speaking with the correct person using two identifiers. Location patient: home Location nurse: work Persons participating in the telephone visit: patient, nurse.   I discussed the limitations, risks, security and privacy concerns of performing an evaluation and management service by telephone and the availability of in person appointments. I also discussed with the patient that there may be a patient responsible charge related to this service. The patient expressed understanding and verbally consented to this telephonic visit.        Cardiac Risk Factors include: advanced age (>89men, >71 women);diabetes mellitus;Other (see comment), Risk factor comments: hyperlipidemia     Objective:    Today's Vitals   11/09/20 1437  PainSc: 8    There is no height or weight on file to calculate BMI.  Advanced Directives 11/09/2020 07/06/2020 01/30/2020 10/23/2017 01/21/2017 01/21/2017 01/20/2017  Does Patient Have a Medical Advance Directive? No No Yes No No No No  Type of Advance Directive - Public librarian;Living will - - - -  Copy of [place] in Chart? - - No - copy requested - - - -  Would patient like information on creating a medical advance directive? No - Patient declined No - Patient declined - Yes (MAU/Ambulatory/Procedural Areas - Information given) No - Patient declined - -    Current Medications (verified) Outpatient Encounter Medications as of 11/09/2020  Medication Sig   acetaminophen (TYLENOL) 325 MG tablet Take 650 mg by mouth every 6 (six) hours as needed.   acetaminophen (TYLENOL) 500 MG tablet Take 2 tablets (1,000 mg total) by mouth every 6 (six) hours as needed for mild pain.   albuterol (PROVENTIL HFA;VENTOLIN HFA) 108 (90  Base) MCG/ACT inhaler Inhale 2 puffs into the lungs every 4 (four) hours as needed for wheezing or shortness of breath. Please give spacer if possible   amLODipine (NORVASC) 5 MG tablet TAKE 1 TABLET BY MOUTH EVERY DAY (Patient taking differently: every other day.)   atorvastatin (LIPITOR) 10 MG tablet TAKE 0.5 TABLETS (5 MG TOTAL) BY MOUTH EVERY OTHER DAY.   Calcium & Magnesium Carbonates (MYLANTA PO) Take 1 capsule by mouth as directed.   cyclobenzaprine (FLEXERIL) 5 MG tablet 3 (three) times daily as needed.    docusate sodium (COLACE) 100 MG capsule Take 1 capsule (100 mg total) by mouth 2 (two) times daily.   EPINEPHrine (EPIPEN IJ) Inject as directed as needed. Reported on 12/05/2015   fluticasone (FLONASE) 50 MCG/ACT nasal spray PLACE 2 SPRAYS INTO THE NOSE DAILY. (Patient taking differently: as needed.)   folic acid (FOLVITE) 1 MG tablet Take 1 mg by mouth daily.   loratadine (CLARITIN) 10 MG tablet Take 10 mg by mouth daily. MAY TAKE AT HS ALSO   methotrexate (RHEUMATREX) 2.5 MG tablet Take 15 mg by mouth once a week. Caution:Chemotherapy. Protect from light. ( Monday of each week )   pantoprazole (PROTONIX) 40 MG tablet Take 40 mg by mouth daily.   polyethylene glycol (MIRALAX / GLYCOLAX) packet Take 17 g by mouth daily.   predniSONE (DELTASONE) 5 MG tablet Take 5 mg by mouth daily.   No facility-administered encounter medications on file as of 11/09/2020.    Allergies (verified) Bee venom and Humira [adalimumab]   History:  Past Medical History:  Diagnosis Date   Allergic rhinitis    Cataract 2015   bilateral   COVID-19 10/2019   ASYMPTOMATIC   Diverticulosis    Esophageal reflux    Essential hypertension 01/07/2016   Fatty liver 2018   Fibula fracture 02/2005   Former smoker    Gastritis 2003   Heart murmur    MILD, NO CARDIOLOGIST   Hemorrhoids    History of cardiac dysrhythmia 2011   ABLATION  AT [facility]   History of kidney stones YRS AGO    Hyperglycemia    Hyperlipemia    Osteoarthritis    Personal history of colonic polyps 1998   hyperplastic   PMB (postmenopausal bleeding)    Pre-diabetes    BORDERLINE DIET CONTROLLED DOES NOT CHECK CBG   RA (rheumatoid arthritis) ([place])    Rectal bleed 2018   TRANSFUSION GIVEN   Shingles 2012   Uterine fibroid    Past Surgical History:  Procedure Laterality Date   CHOLECYSTECTOMY  YRS AGO   COLONOSCOPY  11/08   internal hemorrhoids   DILATATION & CURETTAGE/HYSTEROSCOPY WITH MYOSURE N/A 07/06/2020   Procedure: DILATATION & CURETTAGE/HYSTEROSCOPY;  Surgeon: Joseph Pierini, MD;  Location: [facility];  Service: Gynecology;  Laterality: N/A;  request 7:30am OR time in [place] block requests 30 minutes   EP procedure  1994   SVT; normal echo   ESOPHAGOGASTRODUODENOSCOPY  11/03   reactive gastropathy   EYE SURGERY Bilateral 2017   CATARACTS   LIPOMA EXCISION  11/2002   SVT s/p surgery--? ablation  2011   AT [place] AGO   vaginal polyp excised  12/2000   benign   Family History  Problem Relation Age of Onset   Colon cancer Father 78   Diabetes Mother    Heart disease Mother    Kidney disease Mother        ESRD   Stroke Mother    Hypertension Brother    Breast cancer Sister 12   Diabetes Other        nephews   Colon cancer Sister    Esophageal cancer Neg Hx    Stomach cancer Neg Hx    Rectal cancer Neg Hx    Social History   Socioeconomic History   Marital status: Married    Spouse name: Not on file   Number of children: 3   Years of education: Not on file   Highest education level: Not on file  Occupational History   Occupation: retired    Fish farm manager: RETIRED  Tobacco Use   Smoking status: Former Smoker    Packs/day: 0.75    Years: 15.00    Pack years: 11.25    Types: Cigarettes    Quit date: 10/21/2003    Years since quitting: 17.0   Smokeless tobacco: Never Used  IT trainer Use: Never used  Substance and Sexual Activity   Alcohol use: No    Alcohol/week: 0.0 standard drinks   Drug use: No   Sexual activity: Yes    Comment: 1st intercourse 80 yo-Fewer than 5 partners  Other Topics Concern   Not on file  Social History Narrative   Married      Retired      Occasional caffeine      No regular exercise   Social Determinants of Health   Financial Resource Strain: Low Risk    Difficulty of Paying Living Expenses: Not hard  at all  Food Insecurity: No Food Insecurity   Worried About Charity fundraiser in the Last Year: Never true   Ran Out of Food in the Last Year: Never true  Transportation Needs: No Transportation Needs   Lack of Transportation (Medical): No   Lack of Transportation (Non-Medical): No  Physical Activity: Inactive   Days of Exercise per Week: 0 days   Minutes of Exercise per Session: 0 min  Stress: No Stress Concern Present   Feeling of Stress : Not at all  Social Connections: Not on file    Tobacco Counseling Counseling given: Not Answered   Clinical Intake:  Pre-visit preparation completed: Yes  Pain : 0-10 Pain Score: 8  Pain Type: Chronic pain Pain Location: Leg (hands) Pain Orientation: Left,Right Pain Descriptors / Indicators: Aching Pain Onset: More than a month ago Pain Frequency: Intermittent     Nutritional Risks: None Diabetes: Yes CBG done?: No Did pt. bring in CBG monitor from home?: No  How often do you need to have someone help you when you read instructions, pamphlets, or other written materials from your doctor or pharmacy?: 1 - Never What is the last grade level you completed in school?: 12th  Diabetic: Yes Nutrition Risk Assessment:  Has the patient had any N/V/D within the last 2 months?  No  Does the patient have any non-healing wounds?  No  Has the patient had any unintentional weight loss or weight gain?  No   Diabetes:  Is the patient diabetic?  Yes  If  diabetic, was a CBG obtained today?  No  telephone visit  Did the patient bring in their glucometer from home?  No  telephone visit  How often do you monitor your CBG's? never.   Financial Strains and Diabetes Management:  Are you having any financial strains with the device, your supplies or your medication? No .  Does the patient want to be seen by Chronic Care Management for management of their diabetes?  No  Would the patient like to be referred to a Nutritionist or for Diabetic Management?  No   Diabetic Exams:  Diabetic Eye Exam: Completed 11/21/2019 Diabetic Foot Exam: Overdue, Pt has been advised about the importance in completing this exam. Pt is scheduled for diabetic foot exam on 11/13/2020.   Interpreter Needed?: No  Information entered by :: CJohnson, LPN   Activities of Daily Living In your present state of health, do you have any difficulty performing the following activities: 11/09/2020 07/06/2020  Hearing? N Y  Vision? N N  Difficulty concentrating or making decisions? N N  Walking or climbing stairs? N N  Dressing or bathing? N N  Doing errands, shopping? N -  Preparing Food and eating ? N -  Using the Toilet? N -  In the past six months, have you accidently leaked urine? N -  Do you have problems with loss of bowel control? N -  Managing your Medications? N -  Managing your Finances? N -  Housekeeping or managing your Housekeeping? N -  Some recent data might be hidden    Patient Care Team: Tower, Wynelle Fanny, MD as PCP - General Fay Records, MD as Consulting Physician (Cardiology) Ladene Artist, MD as Consulting Physician (Gastroenterology) Lorelee Cover., MD as Consulting Physician (Ophthalmology) Debbora Dus, [facility] as Pharmacist (Pharmacist)  Indicate any recent Medical Services you may have received from other than [facility] providers in the past year (date may be approximate).  Assessment:   This is a routine wellness examination for  Emanda.  Hearing/Vision screen  Hearing Screening   125Hz  250Hz  500Hz  1000Hz  2000Hz  3000Hz  4000Hz  6000Hz  8000Hz   Right ear:           Left ear:           Vision Screening Comments: Patient gets annual eye exams   Dietary issues and exercise activities discussed: Current Exercise Habits: The patient does not participate in regular exercise at present, Exercise limited by: None identified  Goals     Increase physical activity     Starting 10/23/2017, I will continue to walk for 10 minutes daily.      Patient Stated     01/30/2020, I will maintain and continue medications as prescribed.      Patient Stated     11/09/2020, I will maintain and continue medications as prescribed.       Depression Screen PHQ 2/9 Scores 11/09/2020 01/30/2020 01/30/2020 10/29/2018 10/23/2017 03/31/2017 01/03/2016  PHQ - 2 Score 0 0 1 0 0 2 0  PHQ- 9 Score 0 0 - - 2 10 -    Fall Risk Fall Risk  11/09/2020 01/30/2020 10/29/2018 10/23/2017 03/31/2017  Falls in the past year? 0 0 0 No No  Number falls in past yr: 0 0 0 - -  Injury with Fall? 0 0 - - -  Risk for fall due to : Medication side effect Medication side effect - - -  Follow up Falls evaluation completed;Falls prevention discussed Falls evaluation completed;Falls prevention discussed - - -    FALL RISK PREVENTION PERTAINING TO THE HOME:  Any stairs in or around the home? Yes  If so, are there any without handrails? No  Home free of loose throw rugs in walkways, pet beds, electrical cords, etc? Yes  Adequate lighting in your home to reduce risk of falls? Yes   ASSISTIVE DEVICES UTILIZED TO PREVENT FALLS:  Life alert? No  Use of a cane, walker or w/c? Yes Grab bars in the bathroom? No  Shower chair or bench in shower? No  Elevated toilet seat or a handicapped toilet? No   TIMED UP AND GO:  Was the test performed? N/A telephone visit.    Cognitive Function: MMSE - Mini Mental State Exam 11/09/2020 01/30/2020 10/23/2017 01/03/2016  Orientation to  time 5 5 5 5   Orientation to Place 5 5 5 5   Registration 3 3 3 3   Attention/ Calculation 5 5 0 5  Recall 3 3 3 3   Language- name 2 objects - - 0 0  Language- repeat 1 1 1 1   Language- follow 3 step command - - 3 3  Language- read & follow direction - - 0 1  Write a sentence - - 0 0  Copy design - - 0 0  Total score - - 20 26  Mini Cog  Mini-Cog screen was completed. Maximum score is 22. A value of 0 denotes this part of the MMSE was not completed or the patient failed this part of the Mini-Cog screening.       Immunizations Immunization History  Administered Date(s) Administered   Fluad Quad(high Dose 65+) 08/14/2020   Influenza,inj,Quad PF,6+ Mos 08/10/2015, 08/28/2016, 10/27/2017, 07/27/2018   PFIZER(Purple Top)SARS-COV-2 Vaccination 12/18/2019, 01/17/2020, 10/18/2020   Pneumococcal Conjugate-13 01/03/2016   Pneumococcal Polysaccharide-23 04/24/2014   Td 09/04/2006    TDAP status: Due, Education has been provided regarding the importance of this vaccine. Advised may receive this vaccine at local  pharmacy or Health Dept. Aware to provide a copy of the vaccination record if obtained from local pharmacy or Health Dept. Verbalized acceptance and understanding.  Flu Vaccine status: Up to date  Pneumococcal vaccine status: Up to date  Covid-19 vaccine status: Completed vaccines  Qualifies for Shingles Vaccine? Yes   Zostavax completed No   Shingrix Completed?: No.    Education has been provided regarding the importance of this vaccine. Patient has been advised to call insurance company to determine out of pocket expense if they have not yet received this vaccine. Advised may also receive vaccine at local pharmacy or Health Dept. Verbalized acceptance and understanding.  Screening Tests Health Maintenance  Topic Date Due   Hepatitis C Screening  Never done   MAMMOGRAM  11/18/2018   FOOT EXAM  10/30/2019   URINE MICROALBUMIN  10/30/2019   HEMOGLOBIN A1C   07/31/2020   TETANUS/TDAP  05/07/2029 (Originally 09/04/2016)   OPHTHALMOLOGY EXAM  11/20/2020   INFLUENZA VACCINE  Completed   DEXA SCAN  Completed   COVID-19 Vaccine  Completed   PNA vac Low Risk Adult  Completed    Health Maintenance  Health Maintenance Due  Topic Date Due   Hepatitis C Screening  Never done   MAMMOGRAM  11/18/2018   FOOT EXAM  10/30/2019   URINE MICROALBUMIN  10/30/2019   HEMOGLOBIN A1C  07/31/2020    Colorectal cancer screening: No longer required.   Mammogram status: due, Patient will call and schedule appointment   Bone Density status: due, will discuss with provider at physical   Lung Cancer Screening: (Low Dose CT Chest recommended if Age 18-80 years, 30 pack-year currently smoking OR have quit w/in 15years.) does not qualify.  Additional Screening:  Hepatitis C Screening: does qualify; Completed due  Vision Screening: Recommended annual ophthalmology exams for early detection of glaucoma and other disorders of the eye. Is the patient up to date with their annual eye exam?  Yes  Who is the provider or what is the name of the office in which the patient attends annual eye exams? Dr. Lorie Apley If pt is not established with a provider, would they like to be referred to a provider to establish care? No .   Dental Screening: Recommended annual dental exams for proper oral hygiene  Community Resource Referral / Chronic Care Management: CRR required this visit?  No   CCM required this visit?  No      Plan:     I have personally reviewed and noted the following in the patients chart:    Medical and social history  Use of alcohol, tobacco or illicit drugs   Current medications and supplements  Functional ability and status  Nutritional status  Physical activity  Advanced directives  List of other physicians  Hospitalizations, surgeries, and ER visits in previous 12 months  Vitals  Screenings to include cognitive,  depression, and falls  Referrals and appointments  In addition, I have reviewed and discussed with patient certain preventive protocols, quality metrics, and best practice recommendations. A written personalized care plan for preventive services as well as general preventive health recommendations were provided to patient.   Due to this being a telephonic visit, the after visit summary with patients personalized plan was offered to patient via office or my-chart. Patient preferred to pick up at office at next visit or mychart.   Andrez Grime, LPN   [id number]

## 2020-11-09 NOTE — Patient Instructions (Addendum)
[name] , Thank you for taking time to come for your Medicare Wellness Visit. I appreciate your ongoing commitment to your health goals. Please review the following plan we discussed and let me know if I can assist you in the future.   Screening recommendations/referrals: Colonoscopy: no longer required  Mammogram: due, Please call and schedule appointment  Bone Density: due, please call and schedule appointment after discussing with provider first  Recommended yearly ophthalmology/optometry visit for glaucoma screening and checkup Recommended yearly dental visit for hygiene and checkup  Vaccinations: Influenza vaccine: Up to date, completed 08/14/2020, due 05/2021 Pneumococcal vaccine: Completed series Tdap vaccine: decline-insurance Shingles vaccine: due, check with your insurance regarding coverage if interested    Covid-19:Completed series  Advanced directives: Advance directive discussed with you today. Even though you declined this today please call our office should you change your mind and we can give you the proper paperwork for you to fill out.  Conditions/risks identified: diabetes, hyperlipidemia   Next appointment: Follow up in one year for your annual wellness visit    Preventive Care 65 Years and Older, Female Preventive care refers to lifestyle choices and visits with your health care provider that can promote health and wellness. What does preventive care include?  A yearly physical exam. This is also called an annual well check.  Dental exams once or twice a year.  Routine eye exams. Ask your health care provider how often you should have your eyes checked.  Personal lifestyle choices, including:  Daily care of your teeth and gums.  Regular physical activity.  Eating a healthy diet.  Avoiding tobacco and drug use.  Limiting alcohol use.  Practicing safe sex.  Taking low-dose aspirin every day.  Taking vitamin and mineral supplements as recommended  by your health care provider. What happens during an annual well check? The services and screenings done by your health care provider during your annual well check will depend on your age, overall health, lifestyle risk factors, and family history of disease. Counseling  Your health care provider may ask you questions about your:  Alcohol use.  Tobacco use.  Drug use.  Emotional well-being.  Home and relationship well-being.  Sexual activity.  Eating habits.  History of falls.  Memory and ability to understand (cognition).  Work and work Statistician.  Reproductive health. Screening  You may have the following tests or measurements:  Height, weight, and BMI.  Blood pressure.  Lipid and cholesterol levels. These may be checked every 5 years, or more frequently if you are over 64 years old.  Skin check.  Lung cancer screening. You may have this screening every year starting at age 82 if you have a 30-pack-year history of smoking and currently smoke or have quit within the past 15 years.  Fecal occult blood test (FOBT) of the stool. You may have this test every year starting at age 25.  Flexible sigmoidoscopy or colonoscopy. You may have a sigmoidoscopy every 5 years or a colonoscopy every 10 years starting at age 45.  Hepatitis C blood test.  Hepatitis B blood test.  Sexually transmitted disease (STD) testing.  Diabetes screening. This is done by checking your blood sugar (glucose) after you have not eaten for a while (fasting). You may have this done every 1-3 years.  Bone density scan. This is done to screen for osteoporosis. You may have this done starting at age 67.  Mammogram. This may be done every 1-2 years. Talk to your health care provider  about how often you should have regular mammograms. Talk with your health care provider about your test results, treatment options, and if necessary, the need for more tests. Vaccines  Your health care provider may  recommend certain vaccines, such as:  Influenza vaccine. This is recommended every year.  Tetanus, diphtheria, and acellular pertussis (Tdap, Td) vaccine. You may need a Td booster every 10 years.  Zoster vaccine. You may need this after age 1.  Pneumococcal 13-valent conjugate (PCV13) vaccine. One dose is recommended after age 69.  Pneumococcal polysaccharide (PPSV23) vaccine. One dose is recommended after age 50. Talk to your health care provider about which screenings and vaccines you need and how often you need them. This information is not intended to replace advice given to you by your health care provider. Make sure you discuss any questions you have with your health care provider. Document Released: 11/02/2015 Document Revised: 06/25/2016 Document Reviewed: 08/07/2015 Elsevier Interactive Patient Education  2017 [employer] Prevention in the Home Falls can cause injuries. They can happen to people of all ages. There are many things you can do to make your home safe and to help prevent falls. What can I do on the outside of my home?  Regularly fix the edges of walkways and driveways and fix any cracks.  Remove anything that might make you trip as you walk through a door, such as a raised step or threshold.  Trim any bushes or trees on the path to your home.  Use bright outdoor lighting.  Clear any walking paths of anything that might make someone trip, such as rocks or tools.  Regularly check to see if handrails are loose or broken. Make sure that both sides of any steps have handrails.  Any raised decks and porches should have guardrails on the edges.  Have any leaves, snow, or ice cleared regularly.  Use sand or salt on walking paths during winter.  Clean up any spills in your garage right away. This includes oil or grease spills. What can I do in the bathroom?  Use night lights.  Install grab bars by the toilet and in the tub and shower. Do not use towel  bars as grab bars.  Use non-skid mats or decals in the tub or shower.  If you need to sit down in the shower, use a plastic, non-slip stool.  Keep the floor dry. Clean up any water that spills on the floor as soon as it happens.  Remove soap buildup in the tub or shower regularly.  Attach bath mats securely with double-sided non-slip rug tape.  Do not have throw rugs and other things on the floor that can make you trip. What can I do in the bedroom?  Use night lights.  Make sure that you have a light by your bed that is easy to reach.  Do not use any sheets or blankets that are too big for your bed. They should not hang down onto the floor.  Have a firm chair that has side arms. You can use this for support while you get dressed.  Do not have throw rugs and other things on the floor that can make you trip. What can I do in the kitchen?  Clean up any spills right away.  Avoid walking on wet floors.  Keep items that you use a lot in easy-to-reach places.  If you need to reach something above you, use a strong step stool that has a grab bar.  Keep electrical cords out of the way.  Do not use floor polish or wax that makes floors slippery. If you must use wax, use non-skid floor wax.  Do not have throw rugs and other things on the floor that can make you trip. What can I do with my stairs?  Do not leave any items on the stairs.  Make sure that there are handrails on both sides of the stairs and use them. Fix handrails that are broken or loose. Make sure that handrails are as long as the stairways.  Check any carpeting to make sure that it is firmly attached to the stairs. Fix any carpet that is loose or worn.  Avoid having throw rugs at the top or bottom of the stairs. If you do have throw rugs, attach them to the floor with carpet tape.  Make sure that you have a light switch at the top of the stairs and the bottom of the stairs. If you do not have them, ask someone to  add them for you. What else can I do to help prevent falls?  Wear shoes that:  Do not have high heels.  Have rubber bottoms.  Are comfortable and fit you well.  Are closed at the toe. Do not wear sandals.  If you use a stepladder:  Make sure that it is fully opened. Do not climb a closed stepladder.  Make sure that both sides of the stepladder are locked into place.  Ask someone to hold it for you, if possible.  Clearly mark and make sure that you can see:  Any grab bars or handrails.  First and last steps.  Where the edge of each step is.  Use tools that help you move around (mobility aids) if they are needed. These include:  Canes.  Walkers.  Scooters.  Crutches.  Turn on the lights when you go into a dark area. Replace any light bulbs as soon as they burn out.  Set up your furniture so you have a clear path. Avoid moving your furniture around.  If any of your floors are uneven, fix them.  If there are any pets around you, be aware of where they are.  Review your medicines with your doctor. Some medicines can make you feel dizzy. This can increase your chance of falling. Ask your doctor what other things that you can do to help prevent falls. This information is not intended to replace advice given to you by your health care provider. Make sure you discuss any questions you have with your health care provider. Document Released: 08/02/2009 Document Revised: 03/13/2016 Document Reviewed: 11/10/2014 Elsevier Interactive Patient Education  2017 [employer].

## 2020-11-13 ENCOUNTER — Other Ambulatory Visit: Payer: Self-pay

## 2020-11-13 ENCOUNTER — Ambulatory Visit (INDEPENDENT_AMBULATORY_CARE_PROVIDER_SITE_OTHER): Payer: Medicare Other | Admitting: Family Medicine

## 2020-11-13 ENCOUNTER — Encounter: Payer: Self-pay | Admitting: Family Medicine

## 2020-11-13 VITALS — BP 128/84 | HR 87 | Temp 97.0°F | Ht 67.0 in | Wt 198.8 lb

## 2020-11-13 DIAGNOSIS — E7800 Pure hypercholesterolemia, unspecified: Secondary | ICD-10-CM

## 2020-11-13 DIAGNOSIS — M069 Rheumatoid arthritis, unspecified: Secondary | ICD-10-CM

## 2020-11-13 DIAGNOSIS — Z0000 Encounter for general adult medical examination without abnormal findings: Secondary | ICD-10-CM

## 2020-11-13 DIAGNOSIS — E119 Type 2 diabetes mellitus without complications: Secondary | ICD-10-CM | POA: Insufficient documentation

## 2020-11-13 DIAGNOSIS — I10 Essential (primary) hypertension: Secondary | ICD-10-CM | POA: Diagnosis not present

## 2020-11-13 DIAGNOSIS — I700 Atherosclerosis of aorta: Secondary | ICD-10-CM | POA: Diagnosis not present

## 2020-11-13 DIAGNOSIS — R7303 Prediabetes: Secondary | ICD-10-CM | POA: Diagnosis not present

## 2020-11-13 LAB — CBC WITH DIFFERENTIAL/PLATELET
Basophils Absolute: 0 10*3/uL (ref 0.0–0.1)
Basophils Relative: 0.4 % (ref 0.0–3.0)
Eosinophils Absolute: 0.1 10*3/uL (ref 0.0–0.7)
Eosinophils Relative: 2.5 % (ref 0.0–5.0)
HCT: 41.2 % (ref 36.0–46.0)
Hemoglobin: 13.6 g/dL (ref 12.0–15.0)
Lymphocytes Relative: 26.8 % (ref 12.0–46.0)
Lymphs Abs: 1.6 10*3/uL (ref 0.7–4.0)
MCHC: 33.1 g/dL (ref 30.0–36.0)
MCV: 86.8 fl (ref 78.0–100.0)
Monocytes Absolute: 0.3 10*3/uL (ref 0.1–1.0)
Monocytes Relative: 4.5 % (ref 3.0–12.0)
Neutro Abs: 3.8 10*3/uL (ref 1.4–7.7)
Neutrophils Relative %: 65.8 % (ref 43.0–77.0)
Platelets: 283 10*3/uL (ref 150.0–400.0)
RBC: 4.74 Mil/uL (ref 3.87–5.11)
RDW: 16.8 % — ABNORMAL HIGH (ref 11.5–15.5)
WBC: 5.8 10*3/uL (ref 4.0–10.5)

## 2020-11-13 LAB — COMPREHENSIVE METABOLIC PANEL
ALT: 8 U/L (ref 0–35)
AST: 15 U/L (ref 0–37)
Albumin: 4.1 g/dL (ref 3.5–5.2)
Alkaline Phosphatase: 62 U/L (ref 39–117)
BUN: 11 mg/dL (ref 6–23)
CO2: 27 mEq/L (ref 19–32)
Calcium: 9.4 mg/dL (ref 8.4–10.5)
Chloride: 103 mEq/L (ref 96–112)
Creatinine, Ser: 0.85 mg/dL (ref 0.40–1.20)
GFR: 65.57 mL/min (ref >60.00)
Glucose, Bld: 96 mg/dL (ref 70–99)
Potassium: 3.9 mEq/L (ref 3.5–5.1)
Sodium: 137 mEq/L (ref 135–145)
Total Bilirubin: 0.4 mg/dL (ref 0.2–1.2)
Total Protein: 7.6 g/dL (ref 6.0–8.3)

## 2020-11-13 LAB — LIPID PANEL
Cholesterol: 172 mg/dL (ref 0–200)
HDL: 52.6 mg/dL (ref >39.00)
LDL Cholesterol: 80 mg/dL (ref 0–99)
NonHDL: 119.21
Total CHOL/HDL Ratio: 3
Triglycerides: 197 mg/dL — ABNORMAL HIGH (ref 0.0–149.0)
VLDL: 39.4 mg/dL (ref 0.0–40.0)

## 2020-11-13 LAB — TSH: TSH: 0.96 u[IU]/mL (ref 0.35–4.50)

## 2020-11-13 LAB — HEMOGLOBIN A1C: Hgb A1c MFr Bld: 6.5 % (ref 4.6–6.5)

## 2020-11-13 MED ORDER — AMLODIPINE BESYLATE 5 MG PO TABS: 5.0000 mg | ORAL_TABLET | Freq: Every day | ORAL | 3 refills | Status: DC

## 2020-11-13 MED ORDER — ATORVASTATIN CALCIUM 10 MG PO TABS: 5.0000 mg | ORAL_TABLET | ORAL | 3 refills | Status: DC

## 2020-11-13 NOTE — Progress Notes (Signed)
Subjective:    Patient ID: [name], female    DOB: [birth year], 80 y.o.   MRN: [id number]  This visit occurred during the SARS-CoV-2 public health emergency.  Safety protocols were in place, including screening questions prior to the visit, additional usage of staff PPE, and extensive cleaning of exam room while observing appropriate contact time as indicated for disinfecting solutions.    HPI Here for health maintenance exam and to review chronic medical problems    amw was last mo  Wt Readings from Last 3 Encounters:  11/13/20 198 lb 12.8 oz (90.2 kg)  07/06/20 195 lb 12.8 oz (88.8 kg)  06/01/20 194 lb 3 oz (88.1 kg)   31.14 kg/m  Feeling fair  Is tired a lot  Does not sleep well  Diet is balanced    Colonoscopy 6/17 Polyp Also father had colon cancer 5 y recall will be in june   Exercise -walking   Mammogram 1/19 Self breast exam -no lumps   dexa 12/17-normal Falls -none Fractures -none  Supplements - vit D 1000 iu gave her headaches / wants to try another brand  Exercise -walking   covid status-immunized with booster  Zoster status - if covered would consider   HTN bp is stable today  No cp or palpitations or headaches or edema  No side effects to medicines  BP Readings from Last 3 Encounters:  11/13/20 128/84  09/26/20 124/80  07/20/20 124/80     Pulse Readings from Last 3 Encounters:  11/13/20 87  07/06/20 71  06/01/20 81   Amlodipine 5 mg every other day (supposed to take daily)  Prediabetes Lab Results  Component Value Date   HGBA1C 6.3 01/30/2020  due for labs   Avoids sweets/sugar  Also bread/pastra   She takes prednisone 5 mg daily    Hyperlipidemia  Atorvastatin 5 mg every other day  Lab Results  Component Value Date   CHOL 166 04/28/2019   HDL 45.70 04/28/2019   LDLCALC 82 04/28/2019   LDLDIRECT 60.0 10/26/2018   TRIG 191.0 (H) 04/28/2019   CHOLHDL 4 04/28/2019  5 mg atorvastatin every day   Patient Active  Problem List   Diagnosis Date Noted  . Hyperlipidemia 10/15/2020  . Pre-op exam 06/01/2020  . Fatigue 01/30/2020  . TMJ (dislocation of temporomandibular joint) 04/29/2019  . Encounter for screening mammogram for breast cancer 10/27/2017  . Routine general medical examination at a health care facility 10/27/2017  . Aortic atherosclerosis ([place]) 02/03/2017  . RA (rheumatoid arthritis) ([place])   . Prediabetes   . Adjustment disorder with mixed anxiety and depressed mood 06/24/2016  . Essential hypertension 01/07/2016  . Coronary atherosclerosis 11/07/2015  . GERD 07/09/2007   Past Medical History:  Diagnosis Date  . Allergic rhinitis   . Cataract 2015   bilateral  . COVID-19 10/2019   ASYMPTOMATIC  . Diverticulosis   . Esophageal reflux   . Essential hypertension 01/07/2016  . Fatty liver 2018  . Fibula fracture 02/2005  . Former smoker   . Gastritis 2003  . Heart murmur    MILD, NO CARDIOLOGIST  . Hemorrhoids   . History of cardiac dysrhythmia 2011   ABLATION  AT [facility]  . History of kidney stones YRS AGO  . Hyperglycemia   . Hyperlipemia   . Osteoarthritis   . Personal history of colonic polyps 1998   hyperplastic  . PMB (postmenopausal bleeding)   . Pre-diabetes    BORDERLINE DIET CONTROLLED DOES NOT  CHECK CBG  . RA (rheumatoid arthritis) ([place])   . Rectal bleed 2018   TRANSFUSION GIVEN  . Shingles 2012  . Uterine fibroid    Past Surgical History:  Procedure Laterality Date  . CHOLECYSTECTOMY  YRS AGO  . COLONOSCOPY  11/08   internal hemorrhoids  . DILATATION & CURETTAGE/HYSTEROSCOPY WITH MYOSURE N/A 07/06/2020   Procedure: DILATATION & CURETTAGE/HYSTEROSCOPY;  Surgeon: Joseph Pierini, MD;  Location: [facility];  Service: Gynecology;  Laterality: N/A;  request 7:30am OR time in [place] [facility] block requests 30 minutes  . EP procedure  1994   SVT; normal echo  . ESOPHAGOGASTRODUODENOSCOPY  11/03   reactive gastropathy  . EYE SURGERY Bilateral  2017   CATARACTS  . LIPOMA EXCISION  11/2002  . SVT s/p surgery--? ablation  2011   AT [facility]  . TUBAL LIGATION  YRS AGO  . vaginal polyp excised  12/2000   benign   Social History   Tobacco Use  . Smoking status: Former Smoker    Packs/day: 0.75    Years: 15.00    Pack years: 11.25    Types: Cigarettes    Quit date: 10/21/2003    Years since quitting: 17.0  . Smokeless tobacco: Never Used  Vaping Use  . Vaping Use: Never used  Substance Use Topics  . Alcohol use: No    Alcohol/week: 0.0 standard drinks  . Drug use: No   Family History  Problem Relation Age of Onset  . Colon cancer Father 90  . Diabetes Mother   . Heart disease Mother   . Kidney disease Mother        ESRD  . Stroke Mother   . Hypertension Brother   . Breast cancer Sister 9  . Diabetes Other        nephews  . Colon cancer Sister   . Esophageal cancer Neg Hx   . Stomach cancer Neg Hx   . Rectal cancer Neg Hx    Allergies  Allergen Reactions  . Bee Venom Anaphylaxis  . Humira [Adalimumab]     LIPS SWELLED AND RASH   Current Outpatient Medications on File Prior to Visit  Medication Sig Dispense Refill  . acetaminophen (TYLENOL) 325 MG tablet Take 650 mg by mouth every 6 (six) hours as needed.    Marland Kitchen acetaminophen (TYLENOL) 500 MG tablet Take 2 tablets (1,000 mg total) by mouth every 6 (six) hours as needed for mild pain.    Marland Kitchen albuterol (PROVENTIL HFA;VENTOLIN HFA) 108 (90 Base) MCG/ACT inhaler Inhale 2 puffs into the lungs every 4 (four) hours as needed for wheezing or shortness of breath. Please give spacer if possible 1 Inhaler 3  . Calcium & Magnesium Carbonates (MYLANTA PO) Take 1 capsule by mouth as directed.    . cyclobenzaprine (FLEXERIL) 5 MG tablet 3 (three) times daily as needed.     . docusate sodium (COLACE) 100 MG capsule Take 1 capsule (100 mg total) by mouth 2 (two) times daily. 60 capsule 0  . EPINEPHrine (EPIPEN IJ) Inject as directed as needed. Reported on 12/05/2015    .  fluticasone (FLONASE) 50 MCG/ACT nasal spray PLACE 2 SPRAYS INTO THE NOSE DAILY. (Patient taking differently: as needed.) 48 g 3  . folic acid (FOLVITE) 1 MG tablet Take 1 mg by mouth daily.    Marland Kitchen loratadine (CLARITIN) 10 MG tablet Take 10 mg by mouth daily. MAY TAKE AT HS ALSO    . methotrexate (RHEUMATREX) 2.5 MG tablet Take 15  mg by mouth once a week. Caution:Chemotherapy. Protect from light. ( Monday of each week )    . pantoprazole (PROTONIX) 40 MG tablet Take 40 mg by mouth daily.  2  . polyethylene glycol (MIRALAX / GLYCOLAX) packet Take 17 g by mouth daily.    . predniSONE (DELTASONE) 5 MG tablet Take 5 mg by mouth daily.     No current facility-administered medications on file prior to visit.    Review of Systems  Constitutional: Positive for fatigue. Negative for activity change, appetite change, fever and unexpected weight change.  HENT: Negative for congestion, ear pain, rhinorrhea, sinus pressure and sore throat.   Eyes: Negative for pain, redness and visual disturbance.  Respiratory: Negative for cough, shortness of breath and wheezing.   Cardiovascular: Negative for chest pain and palpitations.  Gastrointestinal: Negative for abdominal pain, blood in stool, constipation and diarrhea.  Endocrine: Negative for polydipsia and polyuria.  Genitourinary: Negative for dysuria, frequency and urgency.  Musculoskeletal: Positive for arthralgias. Negative for back pain and myalgias.  Skin: Negative for pallor and rash.  Allergic/Immunologic: Negative for environmental allergies.  Neurological: Negative for dizziness, syncope and headaches.  Hematological: Negative for adenopathy. Does not bruise/bleed easily.  Psychiatric/Behavioral: Negative for decreased concentration and dysphoric mood. The patient is not nervous/anxious.        Objective:   Physical Exam Constitutional:      General: She is not in acute distress.    Appearance: Normal appearance. She is well-developed. She is  obese. She is not ill-appearing or diaphoretic.  HENT:     Head: Normocephalic and atraumatic.     Right Ear: Tympanic membrane, ear canal and external ear normal.     Left Ear: Tympanic membrane, ear canal and external ear normal.     Nose: Nose normal. No congestion.     Mouth/Throat:     Mouth: Mucous membranes are moist.     Pharynx: Oropharynx is clear. No posterior oropharyngeal erythema.  Eyes:     General: No scleral icterus.    Extraocular Movements: Extraocular movements intact.     Conjunctiva/sclera: Conjunctivae normal.     Pupils: Pupils are equal, round, and reactive to light.  Neck:     Thyroid: No thyromegaly.     Vascular: No carotid bruit or JVD.  Cardiovascular:     Rate and Rhythm: Normal rate and regular rhythm.     Pulses: Normal pulses.     Heart sounds: Normal heart sounds. No gallop.   Pulmonary:     Effort: Pulmonary effort is normal. No respiratory distress.     Breath sounds: Normal breath sounds. No wheezing.     Comments: Good air exch Chest:     Chest wall: No tenderness.  Abdominal:     General: Bowel sounds are normal. There is no distension or abdominal bruit.     Palpations: Abdomen is soft. There is no mass.     Tenderness: There is no abdominal tenderness.     Hernia: No hernia is present.  Genitourinary:    Comments: Breast exam: No mass, nodules, thickening, tenderness, bulging, retraction, inflamation, nipple discharge or skin changes noted.  No axillary or clavicular LA.     Musculoskeletal:        General: No tenderness. Normal range of motion.     Cervical back: Normal range of motion and neck supple. No rigidity. No muscular tenderness.     Right lower leg: No edema.     Left lower leg:  No edema.     Comments: No kyphosis  Limited rom of shoulders   Lymphadenopathy:     Cervical: No cervical adenopathy.  Skin:    General: Skin is warm and dry.     Coloration: Skin is not pale.     Findings: No erythema or rash.  Neurological:      Mental Status: She is alert. Mental status is at baseline.     Cranial Nerves: No cranial nerve deficit.     Motor: No abnormal muscle tone.     Coordination: Coordination normal.     Gait: Gait normal.     Deep Tendon Reflexes: Reflexes are normal and symmetric. Reflexes normal.  Psychiatric:        Mood and Affect: Mood normal.        Cognition and Memory: Cognition and memory normal.           Assessment & Plan:   Problem List Items Addressed This Visit      Cardiovascular and Mediastinum   Essential hypertension    bp in fair control at this time  BP Readings from Last 1 Encounters:  11/13/20 128/84   No changes needed Most recent labs reviewed  Disc lifstyle change with low sodium diet and exercise  inst pt to take amlodipine 5 mg daily      Relevant Medications   amLODipine (NORVASC) 5 MG tablet   atorvastatin (LIPITOR) 10 MG tablet   Other Relevant Orders   CBC with Differential/Platelet   Comprehensive metabolic panel   Lipid panel   TSH   Aortic atherosclerosis ([place])    Working to control cholesterol and bp        Relevant Medications   amLODipine (NORVASC) 5 MG tablet   atorvastatin (LIPITOR) 10 MG tablet     Endocrine   RESOLVED: Controlled type 2 diabetes mellitus without complication, without long-term current use of insulin (HCC)   Relevant Medications   atorvastatin (LIPITOR) 10 MG tablet     Musculoskeletal and Integument   RA (rheumatoid arthritis) ([place])    Fairly stable under care of rheumatology Continues prednisone 5 mg daily (disc DM risk with this)          Other   Prediabetes    Lab Results  Component Value Date   HGBA1C 6.3 01/30/2020   Labs drawn today  On prednisone - she may get off of it as she thinks It does not help and plans to discuss with her rheumatologist       Relevant Orders   Hemoglobin A1c   Routine general medical examination at a health care facility - Primary    Reviewed health habits including  diet and exercise and skin cancer prevention Reviewed appropriate screening tests for age  Also reviewed health mt list, fam hx and immunization status , as well as social and family history   See HPI Labs ordered  Encouraged exercise as tolerated inst to schedule her mammogram and info given dexa was nl 12/17  no falls or fx  covid immunized Would consider shingrix if covered Diabetes risk is up with prednisone       Hyperlipidemia    Disc goals for lipids and reasons to control them Rev last labs with pt Rev low sat fat diet in detail Labs done today  Pt tolerates atorvastatin 5 mg every other day      Relevant Medications   amLODipine (NORVASC) 5 MG tablet   atorvastatin (LIPITOR) 10 MG tablet  Other Relevant Orders   Lipid panel

## 2020-11-13 NOTE — Assessment & Plan Note (Signed)
Working to control cholesterol and bp

## 2020-11-13 NOTE — Assessment & Plan Note (Signed)
Lab Results  Component Value Date   HGBA1C 6.3 01/30/2020   Labs drawn today  On prednisone - she may get off of it as she thinks It does not help and plans to discuss with her rheumatologist

## 2020-11-13 NOTE — Assessment & Plan Note (Signed)
Disc goals for lipids and reasons to control them Rev last labs with pt Rev low sat fat diet in detail Labs done today  Pt tolerates atorvastatin 5 mg every other day

## 2020-11-13 NOTE — Assessment & Plan Note (Signed)
Reviewed health habits including diet and exercise and skin cancer prevention Reviewed appropriate screening tests for age  Also reviewed health mt list, fam hx and immunization status , as well as social and family history   See HPI Labs ordered  Encouraged exercise as tolerated inst to schedule her mammogram and info given dexa was nl 12/17  no falls or fx  covid immunized Would consider shingrix if covered Diabetes risk is up with prednisone

## 2020-11-13 NOTE — Assessment & Plan Note (Signed)
Fairly stable under care of rheumatology Continues prednisone 5 mg daily (disc DM risk with this)

## 2020-11-13 NOTE — Patient Instructions (Addendum)
Don't forget to schedule your mammogram   Try another brand of vitamin D   If you are interested in the new shingles vaccine (Shingrix) - call your local pharmacy to check on coverage and availability  If affordable, get on a wait list at your pharmacy to get the vaccine.   Labs today  The prednisone you take will increase blood sugar   To prevent diabetes Try to get most of your carbohydrates from produce (with the exception of white potatoes)  Eat less bread/pasta/rice/snack foods/cereals/sweets and other items from the middle of the grocery store (processed carbs)

## 2020-11-13 NOTE — Assessment & Plan Note (Signed)
bp in fair control at this time  BP Readings from Last 1 Encounters:  11/13/20 128/84   No changes needed Most recent labs reviewed  Disc lifstyle change with low sodium diet and exercise  inst pt to take amlodipine 5 mg daily

## 2020-11-14 DIAGNOSIS — M0579 Rheumatoid arthritis with rheumatoid factor of multiple sites without organ or systems involvement: Secondary | ICD-10-CM | POA: Diagnosis not present

## 2020-11-14 DIAGNOSIS — M150 Primary generalized (osteo)arthritis: Secondary | ICD-10-CM | POA: Diagnosis not present

## 2020-11-16 ENCOUNTER — Encounter: Payer: Self-pay | Admitting: *Deleted

## 2020-11-30 ENCOUNTER — Telehealth: Payer: Self-pay

## 2020-11-30 NOTE — Chronic Care Management (AMB) (Addendum)
Chronic Care Management Pharmacy Assistant   Name: [name]  MRN: [id number] DOB: 20-Jul-1941  Reason for Encounter: Initial questions for CCM visit scheduled 12/04/20  PCP : Abner Greenspan, MD  Allergies:   Allergies  Allergen Reactions   Bee Venom Anaphylaxis   Humira [Adalimumab]     LIPS SWELLED AND RASH    Medications: Outpatient Encounter Medications as of 11/30/2020  Medication Sig   acetaminophen (TYLENOL) 325 MG tablet Take 650 mg by mouth every 6 (six) hours as needed.   acetaminophen (TYLENOL) 500 MG tablet Take 2 tablets (1,000 mg total) by mouth every 6 (six) hours as needed for mild pain.   albuterol (PROVENTIL HFA;VENTOLIN HFA) 108 (90 Base) MCG/ACT inhaler Inhale 2 puffs into the lungs every 4 (four) hours as needed for wheezing or shortness of breath. Please give spacer if possible   amLODipine (NORVASC) 5 MG tablet Take 1 tablet (5 mg total) by mouth daily.   atorvastatin (LIPITOR) 10 MG tablet Take 0.5 tablets (5 mg total) by mouth every other day.   Calcium & Magnesium Carbonates (MYLANTA PO) Take 1 capsule by mouth as directed.   cyclobenzaprine (FLEXERIL) 5 MG tablet 3 (three) times daily as needed.    docusate sodium (COLACE) 100 MG capsule Take 1 capsule (100 mg total) by mouth 2 (two) times daily.   EPINEPHrine (EPIPEN IJ) Inject as directed as needed. Reported on 12/05/2015   fluticasone (FLONASE) 50 MCG/ACT nasal spray PLACE 2 SPRAYS INTO THE NOSE DAILY. (Patient taking differently: as needed.)   folic acid (FOLVITE) 1 MG tablet Take 1 mg by mouth daily.   loratadine (CLARITIN) 10 MG tablet Take 10 mg by mouth daily. MAY TAKE AT HS ALSO   methotrexate (RHEUMATREX) 2.5 MG tablet Take 15 mg by mouth once a week. Caution:Chemotherapy. Protect from light. ( Monday of each week )   pantoprazole (PROTONIX) 40 MG tablet Take 40 mg by mouth daily.   polyethylene glycol (MIRALAX / GLYCOLAX) packet Take 17 g by mouth daily.   predniSONE (DELTASONE) 5 MG  tablet Take 5 mg by mouth daily.   No facility-administered encounter medications on file as of 11/30/2020.    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 No  Any changes in your medications or health? No  Any side effects from any medications? No  Do you have an symptoms or problems not managed by your medications? No  Any concerns about your health right now? No  Has your provider asked that you check blood pressure, blood sugar, or follow special diet at home? No  Do you get any type of exercise on a regular basis? Yes- states she walks 10 minutes a day around her house.  Can you think of a goal you would like to reach for your health? Yes wants to become more active and have more energy.  Do you have any problems getting your medications? Yes Patient's preferred pharmacy is:  [id number] [place], [address] [place] [address] Phone: [phone] Fax: [phone]   Is there anything that you would like to discuss during the appointment? Yes- wants to discuss energy level.    Lataja Newland Unrein was reminded to have all medications, supplements and any blood glucose and blood pressure readings available for review with Debbora Dus, Pharm. D, at her telephone visit on 12/04/20 at 11:00 AM .   Follow-Up:   Pharmacist Review  Debbora Dus, CPP notified  Margaretmary Dys, [place] Assistant [phone]  I have reviewed the care management and care coordination activities outlined in this encounter and I am certifying that I agree with the content of this note. No further action required.  Debbora Dus, PharmD Clinical Pharmacist [facility] at [facility] [phone]

## 2020-12-04 ENCOUNTER — Ambulatory Visit (INDEPENDENT_AMBULATORY_CARE_PROVIDER_SITE_OTHER): Payer: Medicare Other

## 2020-12-04 ENCOUNTER — Other Ambulatory Visit: Payer: Self-pay

## 2020-12-04 DIAGNOSIS — E7800 Pure hypercholesterolemia, unspecified: Secondary | ICD-10-CM

## 2020-12-04 DIAGNOSIS — I10 Essential (primary) hypertension: Secondary | ICD-10-CM

## 2020-12-04 DIAGNOSIS — K219 Gastro-esophageal reflux disease without esophagitis: Secondary | ICD-10-CM

## 2020-12-04 NOTE — Progress Notes (Signed)
Chronic Care Management Pharmacy Note  12/05/2020 Name:  [name] MRN:  [id number] DOB:  July 21, 1941  Subjective: [name] is an 80 y.o. year old female who is a primary patient of Tower, Wynelle Fanny, MD.  The CCM team was consulted for assistance with disease management and care coordination needs.    Engaged with patient by telephone for initial visit in response to provider referral for pharmacy case management and/or care coordination services.   Consent to Services:  The patient was given the following information about Chronic Care Management services today, agreed to services, and gave verbal consent: 1. CCM service includes personalized support from designated clinical staff supervised by the primary care provider, including individualized plan of care and coordination with other care providers 2. 24/7 contact phone numbers for assistance for urgent and routine care needs. 3. Service will only be billed when office clinical staff spend 20 minutes or more in a month to coordinate care. 4. Only one practitioner may furnish and bill the service in a calendar month. 5.The patient may stop CCM services at any time (effective at the end of the month) by phone call to the office staff. 6. The patient will be responsible for cost sharing (co-pay) of up to 20% of the service fee (after annual deductible is met). Patient agreed to services and consent obtained.  Patient Care Team: Tower, Wynelle Fanny, MD as PCP - General Fay Records, MD as Consulting Physician (Cardiology) Ladene Artist, MD as Consulting Physician (Gastroenterology) Lorelee Cover., MD as Consulting Physician (Ophthalmology) Debbora Dus, [facility] as Pharmacist (Pharmacist)  CCM consent 10/16/20  Recent office visits: 11/13/20 - PCP - Working on BP and cholesterol control. Pre-diabetic on prednisone for RA. Schedule mammogram. Try a different brand of vitamin D. 06/01/20 - PCP - Pre-operative exam completed  Recent  consult visits: 09/26/20 - Gynecology - Patient is reassured by the normal appearance of the pelvic organs on today's exam, stable fibroid uterus, no events of ovarian large minute or concerning cyst.  No specific future pelvic ultrasound follow-up needed at this time unless other symptoms develop 07/20/20 - Gynecology - s/p hysteroscopy  07/06/20 - Hysteroscopy   Hospital visits: None in previous 6 months  Objective:  Lab Results  Component Value Date   CREATININE 0.85 11/13/2020   BUN 11 11/13/2020   GFR 65.57 11/13/2020   GFRNONAA >60 01/26/2017   GFRAA >60 01/26/2017   NA 137 11/13/2020   K 3.9 11/13/2020   CALCIUM 9.4 11/13/2020   CO2 27 11/13/2020    Lab Results  Component Value Date/Time   HGBA1C 6.5 11/13/2020 12:09 PM   HGBA1C 6.3 01/30/2020 11:38 AM   GFR 65.57 11/13/2020 12:09 PM   GFR 76.21 04/03/2020 02:38 PM   MICROALBUR 0.9 10/29/2018 11:05 AM   MICROALBUR 2.6 (H) 10/23/2017 10:48 AM    Lab Results  Component Value Date   CHOL 172 11/13/2020   HDL 52.60 11/13/2020   LDLCALC 80 11/13/2020   LDLDIRECT 60.0 10/26/2018   TRIG 197.0 (H) 11/13/2020   CHOLHDL 3 11/13/2020   Hepatic Function Latest Ref Rng & Units 11/13/2020 04/03/2020 04/28/2019  Total Protein 6.0 - 8.3 g/dL 7.6 7.8 7.1  Albumin 3.5 - 5.2 g/dL 4.1 4.3 4.2  AST 0 - 37 U/L '15 17 21  ' ALT 0 - 35 U/L '8 6 12  ' Alk Phosphatase 39 - 117 U/L 62 64 72  Total Bilirubin 0.2 - 1.2 mg/dL 0.4 0.4 0.5  Bilirubin, Direct 0.0 - 0.3 mg/dL - - -   Lab Results  Component Value Date/Time   TSH 0.96 11/13/2020 12:09 PM   TSH 1.72 01/30/2020 11:38 AM   FREET4 0.77 04/27/2014 10:02 AM   CBC Latest Ref Rng & Units 11/13/2020 07/06/2020 04/03/2020  WBC 4.0 - 10.5 K/uL 5.8 - 4.5  Hemoglobin 12.0 - 15.0 g/dL 13.6 14.3 13.4  Hematocrit 36.0 - 46.0 % 41.2 42.0 40.1  Platelets 150.0 - 400.0 K/uL 283.0 - 253.0   DEXA 2017 Normal   No results found for: VD25OH  Clinical ASCVD: No  The 10-year ASCVD risk score Mikey Bussing DC  Jr., et al., 2013) is: 32.4%   Values used to calculate the score:     Age: 35 years     Sex: Female     Is Non-Hispanic African American: Yes     Diabetic: Yes     Tobacco smoker: No     Systolic Blood Pressure: 914 mmHg     Is BP treated: Yes     HDL Cholesterol: 52.6 mg/dL     Total Cholesterol: 172 mg/dL    Depression screen [facility] 2/9 11/09/2020 01/30/2020 01/30/2020  Decreased Interest 0 0 1  Down, Depressed, Hopeless 0 0 0  PHQ - 2 Score 0 0 1  Altered sleeping 0 0 -  Tired, decreased energy 0 0 -  Change in appetite 0 0 -  Feeling bad or failure about yourself  0 0 -  Trouble concentrating 0 0 -  Moving slowly or fidgety/restless 0 0 -  Suicidal thoughts 0 0 -  PHQ-9 Score 0 0 -  Difficult doing work/chores Not difficult at all Not difficult at all -    Social History   Tobacco Use  Smoking Status Former Smoker  . Packs/day: 0.75  . Years: 15.00  . Pack years: 11.25  . Types: Cigarettes  . Quit date: 10/21/2003  . Years since quitting: 17.1  Smokeless Tobacco Never Used   BP Readings from Last 3 Encounters:  11/13/20 128/84  09/26/20 124/80  07/20/20 124/80   Pulse Readings from Last 3 Encounters:  11/13/20 87  07/06/20 71  06/01/20 81   Wt Readings from Last 3 Encounters:  11/13/20 198 lb 12.8 oz (90.2 kg)  07/06/20 195 lb 12.8 oz (88.8 kg)  06/01/20 194 lb 3 oz (88.1 kg)   Assessment/Interventions: Review of patient past medical history, allergies, medications, health status, including review of consultants reports, laboratory and other test data, was performed as part of comprehensive evaluation and provision of chronic care management services.   SDOH:  (Social Determinants of Health) assessments and interventions performed: Yes SDOH Interventions   Flowsheet Row Most Recent Value  SDOH Interventions   Financial Strain Interventions Intervention Not Indicated  [Medications affordable]      CCM Care Plan  Allergies  Allergen Reactions  . Bee  Venom Anaphylaxis  . Humira [Adalimumab]     LIPS SWELLED AND RASH    Medications Reviewed Today    Reviewed by Debbora Dus, [facility] (Pharmacist) on 12/05/20 at [place] List Status: <None>  Medication Order Taking? Sig Documenting Provider Last Dose Status Informant  acetaminophen (TYLENOL) 325 MG tablet [id number] Yes Take 650 mg by mouth every 6 (six) hours as needed. [provider] Taking Active Self  albuterol (PROVENTIL HFA;VENTOLIN HFA) 108 (90 Base) MCG/ACT inhaler [id number] Yes Inhale 2 puffs into the lungs every 4 (four) hours as needed for wheezing or shortness of breath.  Please give spacer if possible Tower, Wynelle Fanny, MD Taking Active Self  amLODipine (NORVASC) 5 MG tablet [id number] Yes Take 1 tablet (5 mg total) by mouth daily. Tower, Wynelle Fanny, MD Taking Active   atorvastatin (LIPITOR) 10 MG tablet [id number] Yes Take 0.5 tablets (5 mg total) by mouth every other day. Tower, Wynelle Fanny, MD Taking Active        Patient not taking:      Discontinued 12/05/20 1036 (Change in therapy)        Patient not taking:      Discontinued 12/05/20 1036 (Completed Course)   docusate sodium (COLACE) 100 MG capsule [id number] Yes Take 1 capsule (100 mg total) by mouth 2 (two) times daily. Bonnielee Haff, MD Taking Active Self  EPINEPHrine [facility] IJ) [id number] Yes Inject as directed as needed. Reported on 12/05/2015 [provider] Taking Active Self  fluticasone (FLONASE) 50 MCG/ACT nasal spray [id number] Yes PLACE 2 SPRAYS INTO THE NOSE DAILY.  Patient taking differently: as needed.   Tower, Wynelle Fanny, MD Taking Active   folic acid (FOLVITE) 1 MG tablet [id number] Yes Take 1 mg by mouth daily. [provider] Taking Active Self  loratadine (CLARITIN) 10 MG tablet [id number] Yes Take 10 mg by mouth daily. MAY TAKE AT [facility] ALSO [provider] Taking Active Self  methotrexate (RHEUMATREX) 2.5 MG tablet [id number] Yes Take 15 mg by mouth once a week. Caution:Chemotherapy.  Protect from light. ( Monday of each week ) [provider] Taking Active Self  pantoprazole (PROTONIX) 40 MG tablet [id number] Yes Take 40 mg by mouth daily. [provider] Taking Active Self  polyethylene glycol (MIRALAX / GLYCOLAX) packet [id number] Yes Take 17 g by mouth daily. [provider] Taking Active Self  predniSONE (DELTASONE) 5 MG tablet [id number] Yes Take 5 mg by mouth daily. [provider] Taking Active Self          Patient Active Problem List   Diagnosis Date Noted  . Hyperlipidemia 10/15/2020  . Pre-op exam 06/01/2020  . Fatigue 01/30/2020  . TMJ (dislocation of temporomandibular joint) 04/29/2019  . Encounter for screening mammogram for breast cancer 10/27/2017  . Routine general medical examination at a health care facility 10/27/2017  . Aortic atherosclerosis ([place]) 02/03/2017  . RA (rheumatoid arthritis) ([place])   . Prediabetes   . Adjustment disorder with mixed anxiety and depressed mood 06/24/2016  . Essential hypertension 01/07/2016  . Coronary atherosclerosis 11/07/2015  . GERD 07/09/2007    Immunization History  Administered Date(s) Administered  . Fluad Quad(high Dose 65+) 08/14/2020  . Influenza,inj,Quad PF,6+ Mos 08/10/2015, 08/28/2016, 10/27/2017, 07/27/2018  . PFIZER(Purple Top)SARS-COV-2 Vaccination 12/18/2019, 01/17/2020, 10/18/2020  . Pneumococcal Conjugate-13 01/03/2016  . Pneumococcal Polysaccharide-23 04/24/2014  . Td 09/04/2006    Conditions to be addressed/monitored:  Hypertension, Hyperlipidemia and GERD  Care Plan : [place]  Updates made by Debbora Dus, [facility] since 12/05/2020 12:00 AM    Problem: Hypertension, Hyperlipidemia and GERD   Priority: High  Onset Date: 12/04/2020  Note:    Current Barriers:  . No barriers identified.  Pharmacist Clinical Goal(s):  Marland Kitchen Over the next 90 days, patient will maintain control of blood pressure as evidenced by home readings within goal <  140/90  through collaboration with PharmD and provider.  . Maintain low cholesterol with LDL goal < 70  Interventions: . 1:1 collaboration with Tower, Wynelle Fanny, MD regarding development and update of comprehensive plan of care as evidenced by provider attestation and  co-signature . Inter-disciplinary care team collaboration (see longitudinal plan of care) . Comprehensive medication review performed; medication list updated in electronic medical record  Hypertension (BP goal <140/90) -controlled clinic readings within goal -Current treatment: . Amlodipine 5 mg - 1 tablet daily -Medications previously tried: none -Current home readings: none -Current exercise habits: tries to walk around her house for 10 minutes, last walked about a week ago -Denies hypotensive/hypertensive symptoms -Educated on Importance of home blood pressure monitoring; Proper BP monitoring technique; Does not have a home monitor. Plans to purchase one. Discussed Omron/arm cuff.  -Counseled to monitor BP at home weekly, document, and provide log at future appointments -Recommended  calling if BP consistently above 140/90 at home. Would like [place] to send rx for BP cuff to see if covered.  Hyperlipidemia: (LDL goal < 70) -controlled LDL at goal  -Current treatment: . Atorvastatin 10 mg - 1/2 tablet every other day (reports actually taking 1 tablet every other day) -Medications previously tried: daily dose atorvastatin Pt reports taking 1 whole tablet every other day. Doing well on this dose. -Educated on Cholesterol goals;  Benefits of statin for ASCVD risk reduction; Exercise goal of 150 minutes per week; -Recommended to continue current medication  GERD (Goal: control reflux symptoms) -controlled per patient report -Current treatment  . Pantoprazole 40 mg - 1 tablet daily . MiraLAX - 1 cup of powder in water daily . Antacid - Uses as needed . Docusate - 1 daily -Medications previously tried: Mylanta  - Reports  symptoms are well controlled -Recommended to continue current medication  Patient Goals/Self-Care Activities . Over the next 90 days, patient will:  - check blood pressure weekly, document, and provide at future appointments     Medication Assistance: None required.  Patient affirms current coverage meets needs.  Additional medications:  Folic acid 1 mg - 1 daily  Methotrexate 2.5 mg - 8 pills every Tuesday   Prednisone 5 mg - 1 tablet daily   Stool softener daily   Claritin daily  Flonase - uses PRN allergy season  Tylenol 325 mg - takes as needed, 1 or 2 tablets up to twice daily  Epi-Pen --> has never needed but has for bee sting allergy  Albuterol - allergy related SOB. uses once last month, symptoms relieved with 2 puffs - repeated three times.  Cyclobenzaprine - thinks she was taken off this, no refill history  Discussed taking Flonase more regularly during allergy season to prevent flare ups. Patient plans to start vitamin B12 500 mcg daily for energy, reports this was recommended by PCP. PCP also recommended vitamin D3 supplement at last visit.   Patient[facility] pharmacy [facility]:  [id number] [place] [address]Phone: [phone]: [phone] Uses pill box? No - lines up bottles on her TV tray Pt endorses good compliance. Wakes up around 7 AM. Skips breakfast. Takes all medicines around 12 PM, then waits 30 minutes before she eats.   We discussed: Benefits of medication synchronization, packaging and delivery as well as enhanced pharmacist oversight with [facility]. Patient decided to: Utilize [facility] pharmacy for medication synchronization, packaging and delivery   Care Plan and Follow Up Patient Decision:  Patient agrees to Care Plan and Follow-up.  Plan: Telephone follow up appointment with care management team member scheduled for:  6 months (June 03, 2021 at 10 AM phone); Monthly  adherence and delivery calls.  MDebbora Dus PharmD Clinical Pharmacist [facility]  at [place] [facility] [place]

## 2020-12-05 ENCOUNTER — Telehealth: Payer: Self-pay

## 2020-12-05 NOTE — Patient Instructions (Addendum)
December 05, 2020  Dear [name],  It was a pleasure meeting you during our initial appointment on December 05, 2020. Below is a summary of the goals we discussed and components of chronic care management. Please contact me anytime with questions or concerns.   Visit Information  Goals Addressed            This Visit's Progress   . Track and Manage My Blood Pressure-Hypertension       Timeframe:  Long-Range Goal Priority:  High Start Date:    12/05/20                         Expected End Date:  reassess in 3 months                      Follow Up Date 03/04/21 - office will contact by phone around this date   - check blood pressure weekly - write blood pressure results in a log or diary  - contact office if blood pressure consistently above 140/90 at home   Why is this important?    You won't feel high blood pressure, but it can still hurt your blood vessels.   High blood pressure can cause heart or kidney problems. It can also cause a stroke.   Making lifestyle changes like losing a little weight or eating less salt will help.   Checking your blood pressure at home and at different times of the day can help to control blood pressure.   If the doctor prescribes medicine remember to take it the way the doctor ordered.   Call the office if you cannot afford the medicine or if there are questions about it.           Patient Care Plan: CCM Pharmacy Care Plan    Problem Identified: Hypertension, Hyperlipidemia and GERD   Priority: High  Onset Date: 12/04/2020  Note:    Current Barriers:  . No barriers identified.  Pharmacist Clinical Goal(s):  Marland Kitchen Over the next 90 days, patient will maintain control of blood pressure as evidenced by home readings within goal < 140/90  through collaboration with PharmD and provider.  . Maintain low cholesterol with LDL goal < 70  Interventions: . 1:1 collaboration with Tower, Wynelle Fanny, MD regarding development and update of  comprehensive plan of care as evidenced by provider attestation and co-signature . Inter-disciplinary care team collaboration (see longitudinal plan of care) . Comprehensive medication review performed; medication list updated in electronic medical record  Hypertension (BP goal <140/90) -controlled clinic readings within goal -Current treatment: . Amlodipine 5 mg - 1 tablet daily -Medications previously tried: none -Current home readings: none -Current exercise habits: tries to walk around her house for 10 minutes, last walked about a week ago -Denies hypotensive/hypertensive symptoms -Educated on Importance of home blood pressure monitoring; Proper BP monitoring technique; Does not have a home monitor. Plans to purchase one. Discussed Omron/arm cuff.  -Counseled to monitor BP at home weekly, document, and provide log at future appointments -Recommended  calling if BP consistently above 140/90 at home. Would like [place] to send rx for BP cuff to see if covered.  Hyperlipidemia: (LDL goal < 70) -controlled LDL at goal  -Current treatment: . Atorvastatin 10 mg - 1/2 tablet every other day (reports actually taking 1 tablet every other day) -Medications previously tried: daily dose atorvastatin Pt reports taking 1 whole tablet every other day. Doing well  on this dose. -Educated on Cholesterol goals;  Benefits of statin for ASCVD risk reduction; Exercise goal of 150 minutes per week; -Recommended to continue current medication  GERD (Goal: control reflux symptoms) -controlled per patient report -Current treatment  . Pantoprazole 40 mg - 1 tablet daily . MiraLAX - 1 cup of powder in water daily . Antacid - Uses as needed . Docusate - 1 daily -Medications previously tried: Mylanta  - Reports symptoms are well controlled -Recommended to continue current medication  Patient Goals/Self-Care Activities . Over the next 90 days, patient will:  - check blood pressure weekly, document, and  provide at future appointments     [name] was given information about Chronic Care Management services today including:  1. CCM service includes personalized support from designated clinical staff supervised by her physician, including individualized plan of care and coordination with other care providers 2. 24/7 contact phone numbers for assistance for urgent and routine care needs. 3. Standard insurance, coinsurance, copays and deductibles apply for chronic care management only during months in which we provide at least 20 minutes of these services. Most insurances cover these services at 100%, however patients may be responsible for any copay, coinsurance and/or deductible if applicable. This service may help you avoid the need for more expensive face-to-face services. 4. Only one practitioner may furnish and bill the service in a calendar month. 5. The patient may stop CCM services at any time (effective at the end of the month) by phone call to the office staff.  Patient agreed to services and verbal consent obtained.   The patient verbalized understanding of instructions, educational materials, and care plan provided today and agreed to receive a mailed copy of patient instructions, educational materials, and care plan.  Telephone follow up appointment with pharmacy team member scheduled for: June 03, 2021 at 10 AM phone  Debbora Dus, PharmD Clinical Pharmacist [facility] [phone]   [email or web address]">  DASH Eating Plan DASH stands for Dietary Approaches to Stop Hypertension. The DASH eating plan is a healthy eating plan that has been shown to:  Reduce high blood pressure (hypertension).  Reduce your risk for type 2 diabetes, heart disease, and stroke.  Help with weight loss. What are tips for following this plan? Reading food labels  Check food labels for the amount of salt (sodium) per  serving. Choose foods with less than 5 percent of the Daily Value of sodium. Generally, foods with less than 300 milligrams (mg) of sodium per serving fit into this eating plan.  To find whole grains, look for the word "whole" as the first word in the ingredient list. Shopping  Buy products labeled as "low-sodium" or "no salt added."  Buy fresh foods. Avoid canned foods and pre-made or frozen meals. Cooking  Avoid adding salt when cooking. Use salt-free seasonings or herbs instead of table salt or sea salt. Check with your health care provider or pharmacist before using salt substitutes.  Do not fry foods. Cook foods using healthy methods such as baking, boiling, grilling, roasting, and broiling instead.  Cook with heart-healthy oils, such as olive, canola, avocado, soybean, or sunflower oil. Meal planning  Eat a balanced diet that includes: ? 4 or more servings of fruits and 4 or more servings of vegetables each day. Try to fill one-half of your plate with fruits and vegetables. ? 6-8 servings of whole grains each day. ? Less than 6 oz (170 g) of lean meat, poultry, or fish each  day. A 3-oz (85-g) serving of meat is about the same size as a deck of cards. One egg equals 1 oz (28 g). ? 2-3 servings of low-fat dairy each day. One serving is 1 cup (237 mL). ? 1 serving of nuts, seeds, or beans 5 times each week. ? 2-3 servings of heart-healthy fats. Healthy fats called omega-3 fatty acids are found in foods such as walnuts, flaxseeds, fortified milks, and eggs. These fats are also found in cold-water fish, such as sardines, salmon, and mackerel.  Limit how much you eat of: ? Canned or prepackaged foods. ? Food that is high in trans fat, such as some fried foods. ? Food that is high in saturated fat, such as fatty meat. ? Desserts and other sweets, sugary drinks, and other foods with added sugar. ? Full-fat dairy products.  Do not salt foods before eating.  Do not eat more than 4 egg  yolks a week.  Try to eat at least 2 vegetarian meals a week.  Eat more home-cooked food and less restaurant, buffet, and fast food.   Lifestyle  When eating at a restaurant, ask that your food be prepared with less salt or no salt, if possible.  If you drink alcohol: ? Limit how much you use to:  0-1 drink a day for women who are not pregnant.  0-2 drinks a day for men. ? Be aware of how much alcohol is in your drink. In the [place] one drink equals one 12 oz bottle of beer (355 mL), one 5 oz glass of wine (148 mL), or one 1 oz glass of hard liquor (44 mL). General information  Avoid eating more than 2,300 mg of salt a day. If you have hypertension, you may need to reduce your sodium intake to 1,500 mg a day.  Work with your health care provider to maintain a healthy body weight or to lose weight. Ask what an ideal weight is for you.  Get at least 30 minutes of exercise that causes your heart to beat faster (aerobic exercise) most days of the week. Activities may include walking, swimming, or biking.  Work with your health care provider or dietitian to adjust your eating plan to your individual calorie needs. What foods should I eat? Fruits All fresh, dried, or frozen fruit. Canned fruit in natural juice (without added sugar). Vegetables Fresh or frozen vegetables (raw, steamed, roasted, or grilled). Low-sodium or reduced-sodium tomato and vegetable juice. Low-sodium or reduced-sodium tomato sauce and tomato paste. Low-sodium or reduced-sodium canned vegetables. Grains Whole-grain or whole-wheat bread. Whole-grain or whole-wheat pasta. Brown rice. [place]. Bulgur. Whole-grain and low-sodium cereals. Pita bread. Low-fat, low-sodium crackers. Whole-wheat flour tortillas. Meats and other proteins Skinless chicken or [place]. Ground chicken or [place]. Pork with fat trimmed off. Fish and seafood. Egg whites. Dried beans, peas, or lentils. Unsalted nuts, nut butters, and seeds.  Unsalted canned beans. Lean cuts of beef with fat trimmed off. Low-sodium, lean precooked or cured meat, such as sausages or meat loaves. Dairy Low-fat (1%) or fat-free (skim) milk. Reduced-fat, low-fat, or fat-free cheeses. Nonfat, low-sodium ricotta or cottage cheese. Low-fat or nonfat yogurt. Low-fat, low-sodium cheese. Fats and oils Soft margarine without trans fats. Vegetable oil. Reduced-fat, low-fat, or light mayonnaise and salad dressings (reduced-sodium). Canola, safflower, olive, avocado, soybean, and sunflower oils. Avocado. Seasonings and condiments Herbs. Spices. Seasoning mixes without salt. Other foods Unsalted popcorn and pretzels. Fat-free sweets. The items listed above may not be a complete list of foods and  beverages you can eat. Contact a dietitian for more information. What foods should I avoid? Fruits Canned fruit in a light or heavy syrup. Fried fruit. Fruit in cream or butter sauce. Vegetables Creamed or fried vegetables. Vegetables in a cheese sauce. Regular canned vegetables (not low-sodium or reduced-sodium). Regular canned tomato sauce and paste (not low-sodium or reduced-sodium). Regular tomato and vegetable juice (not low-sodium or reduced-sodium). Angie Fava. Olives. Grains Baked goods made with fat, such as croissants, muffins, or some breads. Dry pasta or rice meal packs. Meats and other proteins Fatty cuts of meat. Ribs. Fried meat. Berniece Salines. Bologna, salami, and other precooked or cured meats, such as sausages or meat loaves. Fat from the back of a pig (fatback). Bratwurst. Salted nuts and seeds. Canned beans with added salt. Canned or smoked fish. Whole eggs or egg yolks. Chicken or [place] with skin. Dairy Whole or 2% milk, cream, and half-and-half. Whole or full-fat cream cheese. Whole-fat or sweetened yogurt. Full-fat cheese. Nondairy creamers. Whipped toppings. Processed cheese and cheese spreads. Fats and oils Butter. Stick margarine. Lard. Shortening. Ghee.  Bacon fat. Tropical oils, such as coconut, palm kernel, or palm oil. Seasonings and condiments Onion salt, garlic salt, seasoned salt, table salt, and sea salt. Worcestershire sauce. Tartar sauce. Barbecue sauce. Teriyaki sauce. Soy sauce, including reduced-sodium. Steak sauce. Canned and packaged gravies. Fish sauce. Oyster sauce. Cocktail sauce. Store-bought horseradish. Ketchup. Mustard. Meat flavorings and tenderizers. Bouillon cubes. Hot sauces. Pre-made or packaged marinades. Pre-made or packaged taco seasonings. Relishes. Regular salad dressings. Other foods Salted popcorn and pretzels. The items listed above may not be a complete list of foods and beverages you should avoid. Contact a dietitian for more information. Where to find more information  National Heart, Lung, and Blood Institute: https://wilson-eaton.com/  American Heart Association: www.heart.org  Academy of Nutrition and Dietetics: www.eatright.[place]: www.kidney.org Summary  The DASH eating plan is a healthy eating plan that has been shown to reduce high blood pressure (hypertension). It may also reduce your risk for type 2 diabetes, heart disease, and stroke.  When on the DASH eating plan, aim to eat more fresh fruits and vegetables, whole grains, lean proteins, low-fat dairy, and heart-healthy fats.  With the DASH eating plan, you should limit salt (sodium) intake to 2,300 mg a day. If you have hypertension, you may need to reduce your sodium intake to 1,500 mg a day.  Work with your health care provider or dietitian to adjust your eating plan to your individual calorie needs. This information is not intended to replace advice given to you by your health care provider. Make sure you discuss any questions you have with your health care provider. Document Revised: 09/09/2019 Document Reviewed: 09/09/2019 Elsevier Patient Education  2021 [employer].

## 2020-12-05 NOTE — Chronic Care Management (AMB) (Addendum)
    Chronic Care Management Pharmacy Assistant   Name: [name]  MRN: [id number] DOB: [birth year]  Reason for Encounter: Contact [employer] for Blood Pressure Monitor  PCP : Abner Greenspan, MD  Allergies:   Allergies  Allergen Reactions   Bee Venom Anaphylaxis   Humira [Adalimumab]     LIPS SWELLED AND RASH    Medications: Outpatient Encounter Medications as of 12/05/2020  Medication Sig   acetaminophen (TYLENOL) 325 MG tablet Take 650 mg by mouth every 6 (six) hours as needed.   albuterol (PROVENTIL HFA;VENTOLIN HFA) 108 (90 Base) MCG/ACT inhaler Inhale 2 puffs into the lungs every 4 (four) hours as needed for wheezing or shortness of breath. Please give spacer if possible   amLODipine (NORVASC) 5 MG tablet Take 1 tablet (5 mg total) by mouth daily.   atorvastatin (LIPITOR) 10 MG tablet Take 0.5 tablets (5 mg total) by mouth every other day.   docusate sodium (COLACE) 100 MG capsule Take 1 capsule (100 mg total) by mouth 2 (two) times daily.   EPINEPHrine (EPIPEN IJ) Inject as directed as needed. Reported on 12/05/2015   fluticasone (FLONASE) 50 MCG/ACT nasal spray PLACE 2 SPRAYS INTO THE NOSE DAILY. (Patient taking differently: as needed.)   folic acid (FOLVITE) 1 MG tablet Take 1 mg by mouth daily.   loratadine (CLARITIN) 10 MG tablet Take 10 mg by mouth daily. MAY TAKE AT HS ALSO   methotrexate (RHEUMATREX) 2.5 MG tablet Take 15 mg by mouth once a week. Caution:Chemotherapy. Protect from light. ( Monday of each week )   pantoprazole (PROTONIX) 40 MG tablet Take 40 mg by mouth daily.   polyethylene glycol (MIRALAX / GLYCOLAX) packet Take 17 g by mouth daily.   predniSONE (DELTASONE) 5 MG tablet Take 5 mg by mouth daily.   No facility-administered encounter medications on file as of 12/05/2020.    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 They said they needed an [place] was provided but she stated this was not a covered code. She stated that plan did not cover or reimburse for blood pressure cuff.    Follow-Up:  Pharmacist Review  Debbora Dus, CPP notified  Margaretmary Dys, [place] Assistant [phone]  I have reviewed the care management and care coordination activities outlined in this encounter and I am certifying that I agree with the content of this note.   Debbora Dus, PharmD Clinical Pharmacist [facility] at [facility] [phone]

## 2020-12-10 NOTE — Chronic Care Management (AMB) (Addendum)
Per [facility] request [facility] was contacted to coordinate patient's medications for delivery, med sync, and adherence packaging from [facility].  Follow-Up:  Comptroller and Pharmacist Review  Debbora Dus, CPP notified  Margaretmary Dys, [place] Pharmacy Assistant [phone]

## 2020-12-11 NOTE — Chronic Care Management (AMB) (Addendum)
Contacted patient and informed her that per [facility] Medicare the blood pressure monitor is not a covered item. Patient verified understanding.   Follow-Up:  Pharmacist Review  Debbora Dus, CPP notified  Margaretmary Dys, [place] Pharmacy Assistant [phone]

## 2020-12-11 NOTE — Telephone Encounter (Signed)
Please let patient know the BP cuff is not covered.  Debbora Dus, PharmD Clinical Pharmacist [facility] [phone]

## 2020-12-17 ENCOUNTER — Telehealth: Payer: Self-pay | Admitting: Family Medicine

## 2020-12-17 NOTE — Telephone Encounter (Signed)
Daughter came in office and dropped off repeat FMLA to fill and complete. Placed in PCP's inbox.

## 2020-12-18 ENCOUNTER — Telehealth: Payer: Self-pay | Admitting: *Deleted

## 2020-12-18 ENCOUNTER — Telehealth (INDEPENDENT_AMBULATORY_CARE_PROVIDER_SITE_OTHER): Payer: Medicare Other | Admitting: Family Medicine

## 2020-12-18 DIAGNOSIS — R112 Nausea with vomiting, unspecified: Secondary | ICD-10-CM | POA: Diagnosis not present

## 2020-12-18 DIAGNOSIS — R059 Cough, unspecified: Secondary | ICD-10-CM

## 2020-12-18 MED ORDER — ONDANSETRON HCL 8 MG PO TABS: 8.0000 mg | ORAL_TABLET | Freq: Three times a day (TID) | ORAL | 0 refills | Status: DC | PRN

## 2020-12-18 MED ORDER — ONDANSETRON 4 MG PO TBDP: 4.0000 mg | ORAL_TABLET | Freq: Three times a day (TID) | ORAL | 0 refills | Status: DC | PRN

## 2020-12-18 NOTE — Telephone Encounter (Signed)
Patient called stating that she started throwing up this morning and had abdominal pain. Patient stated that the pain has gotten better. Patient denies a fever or diarrhea. Patient stated that she started about two weeks ago with a cough and some SOB. Patient stated that she no longer has those symptoms. Patient stated that she went  to [facility] last Wednesday to visit her son. Patient stated that she started feeling real bad Friday and Saturday and felt bad the whole time. Patient stated that she came home and has had chills and body aches . Patient wants to know if Dr. Glori Bickers can send something in for her nausea to the pharmacy. Patient was advised to try to eat some ice chips and see if she can tolerate those.  [facility]

## 2020-12-18 NOTE — Patient Instructions (Signed)
°  HOME CARE TIPS:  -[place] testing information: https://www.rivera-powers.org/ OR 757-086-2708 Most pharmacies also offer testing and home test kits.  If you have a positive Covid test schedule a prompt follow-up video visit through [facility] health through your primary care office to discuss treatment options.  Treatment is best given within the first 5 to 7 days of symptoms.  -I sent the medication(s) we discussed to your pharmacy: Meds ordered this encounter  Medications   ondansetron (ZOFRAN ODT) 4 MG disintegrating tablet    Sig: Take 1 tablet (4 mg total) by mouth every 8 (eight) hours as needed for nausea or vomiting.    Dispense:  20 tablet    Refill:  0     -can use nasal saline a few times per day if you have nasal congestion  -stay hydrated, drink plenty of fluids and eat small healthy meals - avoid dairy  -follow up with your doctor in 2-3 days unless improving and feeling better  -stay home while sick, except to seek medical care, and if you have COVID19 ideally it would be best to stay home for a full 10 days since the onset of symptoms PLUS one day of no fever and feeling better. Wear a good mask (such as N95 or KN95) if around others to reduce the risk of transmission.  It was nice to meet you today, and I really hope you are feeling better soon. I help [facility] out with telemedicine visits on Tuesdays and Thursdays and am available for visits on those days. If you have any concerns or questions following this visit please schedule a follow up visit with your Primary Care doctor or seek care at a local urgent care clinic to avoid delays in care.    Seek in person care or schedule a follow up video visit promptly if your symptoms worsen, new concerns arise or you are not improving with treatment over the next 24 hours. Call 911 and/or seek emergency care if your symptoms are severe or life threatening.

## 2020-12-18 NOTE — Progress Notes (Signed)
Virtual Visit via Telephone Note  I connected with AAIMA GADDIE on 12/18/20 at  1:20 PM EST by telephone and verified that I am speaking with the correct person using two identifiers.   I discussed the limitations, risks, security and privacy concerns of performing an evaluation and management service by telephone and the availability of in person appointments. I also discussed with the patient that there may be a patient responsible charge related to this service. The patient expressed understanding and agreed to proceed.  Location patient: home, [place] Location provider: work or home office Participants present for the call: patient, provider Patient did not have a visit with me in the prior 7 days to address this/these issue(s).   History of Present Illness:  Acute telemedicine visit for : -Onset: 12/12/20 [place] -Symptoms include: chills a few days ago, nausea, vomiting x 2, cough, nasal congestion, subjective fever -feels, cough, chills and GI symptoms are improving now -Denies: CP, SOB, inability to eat or drink, abd pain now,  inability to get out of bed, known sick contact -Has tried:nothing -Pertinent past medical history:HTN, CAD, RA, GERD -Pertinent medication allergies: -COVID-19 vaccine status: fully vaccinated for covid x3 shots; had flu shot   Observations/Objective: Patient sounds cheerful and well on the phone. I do not appreciate any SOB. Speech and thought processing are grossly intact. Patient reported vitals:  Assessment and Plan:  Nausea and vomiting, intractability of vomiting not specified, unspecified vomiting type  Cough  -we discussed possible serious and likely etiologies, options for evaluation and workup, limitations of telemedicine visit vs in person visit, treatment, treatment risks and precautions. Pt prefers to treat via telemedicine empirically rather than in person at this moment.  Query viral illness,  possible Covid, possible influenza versus other.  She feels she is improving little now.  She opted for symptomatic care, Zofran for nausea if needed, oral hydration and doing a Covid test at home.  She is out of the treatment window for likely benefit from Tamiflu if this is influenza, and is nearing the end of the treatment window if Covid.  Did advise that she schedule prompt follow-up video visit through her primary care office or [place] if she has a positive Covid test.  Also advised  to seek prompt in person care if worsening, new symptoms arise, or if is not continuing to improve over the next 24 hours. Advised of options for inperson care in case PCP office not available. Did let the patient know that I only do telemedicine shifts for [place] on Tuesdays and Thursdays and advised a follow up visit with PCP or at an [facility] if has further questions or concerns.   Follow Up Instructions:  I did not refer this patient for an OV with me in the next 24 hours for this/these issue(s).  I discussed the assessment and treatment plan with the patient. The patient was provided an opportunity to ask questions and all were answered. The patient agreed with the plan and demonstrated an understanding of the instructions.   I spent 14 minutes on the date of this visit in the care of this patient. See summary of tasks completed to properly care for this patient in the detailed notes above which also included counseling of above, review of PMH, medications, allergies, evaluation of the patient and ordering and/or  instructing patient on testing and care options.     Lucretia Kern, DO

## 2020-12-18 NOTE — Telephone Encounter (Signed)
I sent some nausea medicine /zofran to her pharmacy  She will need to be seen virtually here or in urgent care for further eval  (if abd pain needs to go now)  May need to be tested for covid also

## 2020-12-18 NOTE — Telephone Encounter (Signed)
Pt notified of Dr. Marliss Coots instructions. Pt said she's starting to feel a little better so will try zofran, ER/ UC precautions given and advise to call back to do virtual visit if sxs don't continue to improve or worsen, pt verbalized understanding

## 2020-12-21 ENCOUNTER — Telehealth: Payer: Self-pay

## 2020-12-21 NOTE — Telephone Encounter (Signed)
Pt calling with stomach pain; pt said having lower mid dull on and off abd pain; pt said hurts worse after eating anything; pain level now is 6 - 7. On 12/17/20 pt had fever,(pt does not have thermometer but felt hot), chills and diarrhea. Pt was in [place] for her granddaughter basketball games and pt wore a mask and tried to social distance as much as possible on 12/12/20,12/13/20 and 12/14/20. Pt had virtual appt on 12/18/20 for respiratory issues and N&V.advised due to possible covid symptoms cannot bring pt into office and suggest UC for eval. Pt said she has a stomach doctor and she will see if she can be seen in that office if not pt will consider UC. Sending note to DR [employer].

## 2020-12-21 NOTE — Telephone Encounter (Signed)
Agree with that advisement Will watch for correspondence  

## 2020-12-25 ENCOUNTER — Telehealth: Payer: Self-pay

## 2020-12-25 NOTE — Telephone Encounter (Signed)
I spoke with pt; pt is not having any difficulty breathing or difficulty swallowing so pt is waiting to see how she does before going to UC. Pt said if she has any difficulty in breathing, difficulty swallowing or rash worsens pt will go to UC. Pt is going to call back on 12/26/20 with update. Pt is in no distress per pt.Sending note to Dr Glori Bickers.

## 2020-12-25 NOTE — Telephone Encounter (Signed)
[facility] - Client TELEPHONE ADVICE RECORD [facility] Patient Name: [name] 87 Gender: Female DOB: [birth year] Age: 80 Y 35 M 20 D Return Phone Number: [phone] (Primary), [phone] (Secondary) Address[place] [address] Client [facility] - Client Client Site [place] Physician Glori Bickers, Roque Lias - MD Contact Type Call Who Is Calling Patient / Member / Family / Caregiver Call Type Triage / Clinical Relationship To Patient Self Return Phone Number [phone] (Primary) Chief Complaint Medication reaction Reason for Call Symptomatic / Request for [place] states that the medication that she has been taking is breaking her face and breast out and she would like to know if its from the medication. Translation No Nurse Assessment Nurse: Raenette Rover, RN, Zella Ball Date/Time (Eastern Time): 12/25/2020 1:41:57 PM Confirm and document reason for call. If symptomatic, describe symptoms. ---Caller states that the medication that she has been taking is breaking her face and breast out and she would like to know if its from the medication. Medication is on Zofran 8 mg for nausea. She started the medication last week around the same time she started taking the medication. Does the patient have any new or worsening symptoms? ---Yes Will a triage be completed? ---Yes Related visit to physician within the last 2 weeks? ---No Does the PT have any chronic conditions? (i.e. diabetes, asthma, this includes High risk factors for pregnancy, etc.) ---Yes List chronic conditions. ---htn Is this a behavioral health or substance abuse call? ---No Guidelines Guideline Title Affirmed Question Affirmed Notes Nurse Date/Time (Eastern Time) Rash - Widespread On Drugs Face or lip swelling Raenette Rover, RN, Zella Ball 12/25/2020 1:45:01 PM Disp. Time Eilene Ghazi Time) Disposition Final  User 12/25/2020 1:47:56 PM See HCP within 4 Hours (or PCP triage) Yes Raenette Rover, RN, Zella Ball PLEASE NOTE: All timestamps contained within this report are represented as [place] Standard Time. CONFIDENTIALTY NOTICE: This fax transmission is intended only for the addressee. It contains information that is legally privileged, confidential or otherwise protected from use or disclosure. If you are not the intended recipient, you are strictly prohibited from reviewing, disclosing, copying using or disseminating any of this information or taking any action in reliance on or regarding this information. If you have received this fax in error, please notify us immediately by telephone so that we can arrange for its return to [place]. Phone: [phone], Toll-Free: [phone], Fax: [phone] Page: 2 of 2 Call Id: [id number] [place] Disagree/Comply Comply Caller Understands Yes PreDisposition Did not know what to do Care Advice Given Per Guideline SEE HCP (OR PCP TRIAGE) WITHIN 4 HOURS: * UCC: Some UCCs can manage patients who are stable and have less serious symptoms (e.g., minor illnesses and injuries). The triager must know the [facility] capabilities before sending a patient there. If unsure, call ahead. CALL BACK IF: * You become worse CARE ADVICE given per Rash - Widespread on Drugs (Adult) guideline Comments User: Wilson Singer, RN Date/Time (Eastern Time): 12/25/2020 1:54:11 PM Attempted to transfer to back line and there aren't any openings Advised to go to urgent care or ER within the next four hours. Recommended 1% Hydrocortone cream for itching. Advised benedryl isnt recommended for patients with Htn. Advised on the importance of going to an urgent care for treatment. Caller is considering. Referrals GO TO FACILITY UNDECIDED

## 2020-12-27 NOTE — Telephone Encounter (Signed)
I spoke with pt; pt said she is doing better and rash has cleared up;pt also noted she has appt with Dr Talbert Nan on 01/01/21. Pt was appreciative of cb checking on her. Sending FYI to Dr Glori Bickers.

## 2020-12-31 ENCOUNTER — Telehealth: Payer: Self-pay

## 2020-12-31 NOTE — Telephone Encounter (Signed)
Contacted Dr. Tawni Millers office for refill on methotrexate on behalf of patient. Coordinating med sync and delivery through [facility].  Debbora Dus, PharmD Clinical Pharmacist [facility] [phone]

## 2020-12-31 NOTE — Telephone Encounter (Signed)
Patient decided she would like to continue getting her medications through [facility] at this time. Preferred pharmacy changed to [facility].

## 2021-01-01 ENCOUNTER — Other Ambulatory Visit: Payer: Self-pay

## 2021-01-01 ENCOUNTER — Encounter: Payer: Self-pay | Admitting: Obstetrics and Gynecology

## 2021-01-01 ENCOUNTER — Ambulatory Visit: Payer: Medicare Other | Admitting: Obstetrics and Gynecology

## 2021-01-01 ENCOUNTER — Other Ambulatory Visit (HOSPITAL_COMMUNITY)
Admission: RE | Admit: 2021-01-01 | Discharge: 2021-01-01 | Disposition: A | Discharge status: Home / Self Care | Payer: Medicare Other | Source: Ambulatory Visit | Attending: Obstetrics and Gynecology | Admitting: Obstetrics and Gynecology

## 2021-01-01 VITALS — BP 160/74 | HR 91 | Ht 67.0 in | Wt 196.0 lb

## 2021-01-01 DIAGNOSIS — N760 Acute vaginitis: Secondary | ICD-10-CM

## 2021-01-01 DIAGNOSIS — R102 Pelvic and perineal pain: Secondary | ICD-10-CM

## 2021-01-01 DIAGNOSIS — Z113 Encounter for screening for infections with a predominantly sexual mode of transmission: Secondary | ICD-10-CM | POA: Diagnosis not present

## 2021-01-01 DIAGNOSIS — B9689 Other specified bacterial agents as the cause of diseases classified elsewhere: Secondary | ICD-10-CM

## 2021-01-01 DIAGNOSIS — Z124 Encounter for screening for malignant neoplasm of cervix: Secondary | ICD-10-CM | POA: Insufficient documentation

## 2021-01-01 DIAGNOSIS — N950 Postmenopausal bleeding: Secondary | ICD-10-CM | POA: Diagnosis not present

## 2021-01-01 DIAGNOSIS — N898 Other specified noninflammatory disorders of vagina: Secondary | ICD-10-CM

## 2021-01-01 DIAGNOSIS — R109 Unspecified abdominal pain: Secondary | ICD-10-CM

## 2021-01-01 LAB — WET PREP FOR TRICH, YEAST, CLUE

## 2021-01-01 MED ORDER — METRONIDAZOLE 500 MG PO TABS: 500.0000 mg | ORAL_TABLET | Freq: Two times a day (BID) | ORAL | 0 refills | Status: DC

## 2021-01-01 NOTE — Patient Instructions (Signed)
Bacterial Vaginosis  Bacterial vaginosis is an infection that occurs when the normal balance of bacteria in the vagina changes. This change is caused by an overgrowth of certain bacteria in the vagina. Bacterial vaginosis is the most common vaginal infection among females aged 79 to 101 years. This condition increases the risk of sexually transmitted infections (STIs). Treatment can help reduce this risk. Treatment is very important for pregnant women because this condition can cause babies to be born early (prematurely) or at a low birth weight. What are the causes? This condition is caused by an increase in harmful bacteria that are normally present in small amounts in the vagina. However, the exact reason this condition develops is not known. You cannot get bacterial vaginosis from toilet seats, bedding, swimming pools, or contact with objects around you. What increases the risk? The following factors may make you more likely to develop this condition:  Having a new sexual partner or multiple sexual partners, or having unprotected sex.  Douching.  Having an intrauterine device (IUD).  Smoking.  Abusing drugs and alcohol. This may lead to riskier sexual behavior.  Taking certain antibiotic medicines.  Being pregnant. What are the signs or symptoms? Some women with this condition have no symptoms. Symptoms may include:  Pearline Cables or white vaginal discharge. The discharge can be watery or foamy.  A fish-like odor with discharge, especially after sex or during menstruation.  Itching in and around the vagina.  Burning or pain with urination. How is this diagnosed? This condition is diagnosed based on:  Your medical history.  A physical exam of the vagina.  Checking a sample of vaginal fluid for harmful bacteria or abnormal cells. How is this treated? This condition is treated with antibiotic medicines. These may be given as a pill, a vaginal cream, or a medicine that is put into the  vagina (suppository). If the condition comes back after treatment, a second round of antibiotics may be needed. Follow these instructions at home: Medicines  Take or apply over-the-counter and prescription medicines only as told by your health care provider.  Take or apply your antibiotic medicine as told by your health care provider. Do not stop using the antibiotic even if you start to feel better. General instructions  If you have a female sexual partner, tell her that you have a vaginal infection. She should follow up with her health care provider. If you have a female sexual partner, he does not need treatment.  Avoid sexual activity until you finish treatment.  Drink enough fluid to keep your urine pale yellow.  Keep the area around your vagina and rectum clean. ? Wash the area daily with warm water. ? Wipe yourself from front to back after using the toilet.  If you are breastfeeding, talk to your health care provider about continuing breastfeeding during treatment.  Keep all follow-up visits. This is important. How is this prevented? Self-care  Do not douche.  Wash the outside of your vagina with warm water only.  Wear cotton or cotton-lined underwear.  Avoid wearing tight pants and pantyhose, especially during the summer. Safe sex  Use protection when having sex. This includes: ? Using condoms. ? Using dental dams. This is a thin layer of a material made of latex or polyurethane that protects the mouth during oral sex.  Limit the number of sexual partners. To help prevent bacterial vaginosis, it is best to have sex with just one partner (monogamous relationship).  Make sure you and your sexual partner  are tested for STIs. Drugs and alcohol  Do not use any products that contain nicotine or tobacco. These products include cigarettes, chewing tobacco, and vaping devices, such as e-cigarettes. If you need help quitting, ask your health care provider.  Do not use  drugs.  Do not drink alcohol if: ? Your health care provider tells you not to do this. ? You are pregnant, may be pregnant, or are planning to become pregnant.  If you drink alcohol: ? Limit how much you have to 0-1 drink a day. ? Be aware of how much alcohol is in your drink. In the U.S., one drink equals one 12 oz bottle of beer (355 mL), one 5 oz glass of wine (148 mL), or one 1 oz glass of hard liquor (44 mL). Where to find more information  Centers for Disease Control and Prevention: http://www.wolf.info/  American Sexual Health Association (ASHA): www.ashastd.org  U.S. Department of Health and Financial controller, Office on Women's Health: [place].tn Contact a health care provider if:  Your symptoms do not improve, even after treatment.  You have more discharge or pain when urinating.  You have a fever or chills.  You have pain in your abdomen or pelvis.  You have pain during sex.  You have vaginal bleeding between menstrual periods. Summary  Bacterial vaginosis is a vaginal infection that occurs when the normal balance of bacteria in the vagina changes. It results from an overgrowth of certain bacteria.  This condition increases the risk of sexually transmitted infections (STIs). Getting treated can help reduce this risk.  Treatment is very important for pregnant women because this condition can cause babies to be born early (prematurely) or at low birth weight.  This condition is treated with antibiotic medicines. These may be given as a pill, a vaginal cream, or a medicine that is put into the vagina (suppository). This information is not intended to replace advice given to you by your health care provider. Make sure you discuss any questions you have with your health care provider. Document Revised: 04/05/2020 Document Reviewed: 04/05/2020 Elsevier Patient Education  [place].

## 2021-01-01 NOTE — Progress Notes (Signed)
GYNECOLOGY  VISIT   HPI: 80 y.o.   Married Black or [place] American Not Hispanic or Latino  female   [phone] with No LMP recorded. Patient is postmenopausal.   here for Abdominal pain. She is having vaginal discharge. She states that she has been having some light vaginal spotting.   She had a hysteroscopy, D&C in 9/21. Endometrium was atrophic. Pathology with benign atrophic endometrium.   Ultrasound 09/26/20: Anteverted uterus 6.8 x 4.0 x 4.5 cm. Multiple small fibroids stable in appearance. Symmetrical endometrium 0.85 mm. No masses or thickening. Bilateral ovaries are better visualized today versus previous examination. Left ovary with residual 6 mm avascular simple follicle. Right ovary normal. No adnexal masses. No free fluid.  Ultrasound images reviewed   11/13/20 CMP reviewed: normal.  She is having diffuse abdominal pain after eating. For the last 3 days she hasn't been eating well. No straining with BM, when she eats she has a BM daily.  The pain is intermittent for the last 6 months. Went away for a while.  She has occasional vaginal spotting, evaluated for this in the fall and winter. It has gotten less often, she noticed it last month x 1. Occasionally sexually active, not for the last 3 months. Not straining with BM.   On questioning she does report an increase in vaginal d/c, not sure for how long. No other vaginitis symptoms    GYNECOLOGIC HISTORY: No LMP recorded. Patient is postmenopausal. Contraception:none  Menopausal hormone therapy: none        OB History    Gravida  3   Para  3   Term  3   Preterm      AB      Living  3     SAB      IAB      Ectopic      Multiple      Live Births                 Patient Active Problem List   Diagnosis Date Noted  . Hyperlipidemia 10/15/2020  . Pre-op exam 06/01/2020  . Fatigue 01/30/2020  . TMJ (dislocation of temporomandibular joint) 04/29/2019  . Encounter for screening mammogram for  breast cancer 10/27/2017  . Routine general medical examination at a health care facility 10/27/2017  . Aortic atherosclerosis ([place]) 02/03/2017  . RA (rheumatoid arthritis) ([place])   . Prediabetes   . Adjustment disorder with mixed anxiety and depressed mood 06/24/2016  . Essential hypertension 01/07/2016  . Coronary atherosclerosis 11/07/2015  . GERD 07/09/2007    Past Medical History:  Diagnosis Date  . Allergic rhinitis   . Cataract 2015   bilateral  . COVID-19 10/2019   ASYMPTOMATIC  . Diverticulosis   . Esophageal reflux   . Essential hypertension 01/07/2016  . Fatty liver 2018  . Fibula fracture 02/2005  . Former smoker   . Gastritis 2003  . Heart murmur    MILD, NO CARDIOLOGIST  . Hemorrhoids   . History of cardiac dysrhythmia 2011   ABLATION  AT [facility]  . History of kidney stones YRS AGO  . Hyperglycemia   . Hyperlipemia   . Osteoarthritis   . Personal history of colonic polyps 1998   hyperplastic  . PMB (postmenopausal bleeding)   . Pre-diabetes    BORDERLINE DIET CONTROLLED DOES NOT CHECK CBG  . RA (rheumatoid arthritis) ([place])   . Rectal bleed 2018   TRANSFUSION GIVEN  . Shingles 2012  . Uterine  fibroid     Past Surgical History:  Procedure Laterality Date  . CHOLECYSTECTOMY  YRS AGO  . COLONOSCOPY  11/08   internal hemorrhoids  . DILATATION & CURETTAGE/HYSTEROSCOPY WITH MYOSURE N/A 07/06/2020   Procedure: DILATATION & CURETTAGE/HYSTEROSCOPY;  Surgeon: Joseph Pierini, MD;  Location: [facility];  Service: Gynecology;  Laterality: N/A;  request 7:30am OR time in [place] Gyn block requests 30 minutes  . EP procedure  1994   SVT; normal echo  . ESOPHAGOGASTRODUODENOSCOPY  11/03   reactive gastropathy  . EYE SURGERY Bilateral 2017   CATARACTS  . LIPOMA EXCISION  11/2002  . SVT s/p surgery--? ablation  2011   AT [facility]  . TUBAL LIGATION  YRS AGO  . vaginal polyp excised  12/2000   benign    Current Outpatient Medications   Medication Sig Dispense Refill  . acetaminophen (TYLENOL) 325 MG tablet Take 650 mg by mouth every 6 (six) hours as needed.    Marland Kitchen albuterol (PROVENTIL HFA;VENTOLIN HFA) 108 (90 Base) MCG/ACT inhaler Inhale 2 puffs into the lungs every 4 (four) hours as needed for wheezing or shortness of breath. Please give spacer if possible 1 Inhaler 3  . amLODipine (NORVASC) 5 MG tablet Take 1 tablet (5 mg total) by mouth daily. 90 tablet 3  . atorvastatin (LIPITOR) 10 MG tablet Take 0.5 tablets (5 mg total) by mouth every other day. 45 tablet 3  . docusate sodium (COLACE) 100 MG capsule Take 1 capsule (100 mg total) by mouth 2 (two) times daily. 60 capsule 0  . EPINEPHrine (EPIPEN IJ) Inject as directed as needed. Reported on 12/05/2015    . fluticasone (FLONASE) 50 MCG/ACT nasal spray PLACE 2 SPRAYS INTO THE NOSE DAILY. (Patient taking differently: as needed.) 48 g 3  . folic acid (FOLVITE) 1 MG tablet Take 1 mg by mouth daily.    Marland Kitchen loratadine (CLARITIN) 10 MG tablet Take 10 mg by mouth daily. MAY TAKE AT HS ALSO    . methotrexate (RHEUMATREX) 2.5 MG tablet Take 15 mg by mouth once a week. Caution:Chemotherapy. Protect from light. ( Monday of each week )    . pantoprazole (PROTONIX) 40 MG tablet Take 40 mg by mouth daily.  2  . polyethylene glycol (MIRALAX / GLYCOLAX) packet Take 17 g by mouth daily.    . predniSONE (DELTASONE) 5 MG tablet Take 5 mg by mouth daily.     No current facility-administered medications for this visit.     ALLERGIES: Bee venom and Humira [adalimumab]  Family History  Problem Relation Age of Onset  . Colon cancer Father 66  . Diabetes Mother   . Heart disease Mother   . Kidney disease Mother        ESRD  . Stroke Mother   . Hypertension Brother   . Breast cancer Sister 72  . Diabetes Other        nephews  . Colon cancer Sister   . Esophageal cancer Neg Hx   . Stomach cancer Neg Hx   . Rectal cancer Neg Hx     Social History   Socioeconomic History  . Marital  status: Married    Spouse name: Not on file  . Number of children: 3  . Years of education: Not on file  . Highest education level: Not on file  Occupational History  . Occupation: retired    Fish farm manager: RETIRED  Tobacco Use  . Smoking status: Former Smoker    Packs/day: 0.75  Years: 15.00    Pack years: 11.25    Types: Cigarettes    Quit date: 10/21/2003    Years since quitting: 17.2  . Smokeless tobacco: Never Used  Vaping Use  . Vaping Use: Never used  Substance and Sexual Activity  . Alcohol use: No    Alcohol/week: 0.0 standard drinks  . Drug use: No  . Sexual activity: Yes    Comment: 1st intercourse 80 yo-Fewer than 5 partners  Other Topics Concern  . Not on file  Social History Narrative   Married      Retired      Occasional caffeine      No regular exercise   Social Determinants of Radio broadcast assistant Strain: Low Risk   . Difficulty of Paying Living Expenses: Not very hard  Food Insecurity: No Food Insecurity  . Worried About Charity fundraiser in the Last Year: Never true  . Ran Out of Food in the Last Year: Never true  Transportation Needs: No Transportation Needs  . Lack of Transportation (Medical): No  . Lack of Transportation (Non-Medical): No  Physical Activity: Inactive  . Days of Exercise per Week: 0 days  . Minutes of Exercise per Session: 0 min  Stress: No Stress Concern Present  . Feeling of Stress : Not at all  Social Connections: Not on file  Intimate Partner Violence: Not At Risk  . Fear of Current or Ex-Partner: No  . Emotionally Abused: No  . Physically Abused: No  . Sexually Abused: No    Review of Systems  Gastrointestinal: Positive for abdominal pain and nausea.  All other systems reviewed and are negative.   PHYSICAL EXAMINATION:    BP (!) 160/74   Pulse 91   Ht 5\' 7"  (1.702 m)   Wt 196 lb (88.9 kg)   SpO2 91%   BMI 30.70 kg/m     General appearance: alert, cooperative and appears stated age Abdomen: soft,  diffusely tender, no rebound or guarding. Mildly distended.   Pelvic: External genitalia:  no lesions              Urethra:  normal appearing urethra with no masses, tenderness or lesions              Bartholins and Skenes: normal                 Vagina: mildly atrophic appearing vagina with an increase in frothy, white, watery vaginal d/c              Cervix: no cervical motion tenderness, no lesions and not friable              Bimanual Exam:  Uterus:  exam is limited, very tender with any abdominal palpation (diffuse), less tender with vaginal palpation              Adnexa: no masses, + tender                Chaperone was present for exam.  Wet prep: + clue  PH 5.5   1. Combined abdominal and pelvic pain Her pain is the worst in her epigastric region, but diffuse. Not c/w gyn etiology. She will f/u with her primary, go to the ER with worsening pain, fevers or any other concerns.   2. Screening examination for STD (sexually transmitted disease) Given her pain will screen for STD's (anticipate the results will be negative), no CMT  3. Postmenopausal bleeding She has  been having spotting since last summer, negative evaluation with Dr Delilah Shan in the fall and again in 12/21 - Cytology - PAP with STD testing -vaginitis testing also done -Calendar bleeding, try and f/u when bleeding, otherwise f/u in 3 months.  4. Screening for cervical cancer - Cytology - PAP  5. Vaginal discharge - WET PREP FOR TRICH, YEAST, CLUE: + clue cells, pH elevated at 5.5  6. Bacterial vaginitis Will treat with flagyl - metroNIDAZOLE (FLAGYL) 500 MG tablet; Take 1 tablet (500 mg total) by mouth 2 (two) times daily.  Dispense: 14 tablet; Refill: 0   Over 30 minutes in total patient care.   CC: Loura Pardon, MD

## 2021-01-04 ENCOUNTER — Ambulatory Visit (INDEPENDENT_AMBULATORY_CARE_PROVIDER_SITE_OTHER)
Admission: RE | Admit: 2021-01-04 | Discharge: 2021-01-04 | Disposition: A | Discharge status: Home / Self Care | Payer: Medicare Other | Source: Ambulatory Visit | Attending: Primary Care | Admitting: Primary Care

## 2021-01-04 ENCOUNTER — Other Ambulatory Visit: Payer: Self-pay

## 2021-01-04 ENCOUNTER — Ambulatory Visit (INDEPENDENT_AMBULATORY_CARE_PROVIDER_SITE_OTHER): Payer: Medicare Other | Admitting: Primary Care

## 2021-01-04 VITALS — BP 148/84 | HR 88 | Temp 98.6°F | Ht 67.0 in | Wt 195.0 lb

## 2021-01-04 DIAGNOSIS — R1084 Generalized abdominal pain: Secondary | ICD-10-CM

## 2021-01-04 DIAGNOSIS — R109 Unspecified abdominal pain: Secondary | ICD-10-CM | POA: Diagnosis not present

## 2021-01-04 LAB — POC URINALSYSI DIPSTICK (AUTOMATED)
Bilirubin, UA: NEGATIVE
Blood, UA: NEGATIVE
Glucose, UA: NEGATIVE
Nitrite, UA: NEGATIVE
Protein, UA: POSITIVE — AB
Spec Grav, UA: 1.025 (ref 1.010–1.025)
Urobilinogen, UA: 1 E.U./dL
pH, UA: 6 (ref 5.0–8.0)

## 2021-01-04 LAB — CYTOLOGY - PAP
Chlamydia: NEGATIVE
Comment: NEGATIVE
Comment: NEGATIVE
Comment: NORMAL
Diagnosis: NEGATIVE
Diagnosis: REACTIVE
Neisseria Gonorrhea: NEGATIVE
Trichomonas: NEGATIVE

## 2021-01-04 NOTE — Assessment & Plan Note (Signed)
Seems to be acute on chronic issue, she is a poor historian. Confused about her recent visit with GYN.  Exam today with tenderness throughout, however, she does appear stable and is in no distress.   Checking abdominal plain films today.  UA today with 3+ leuks, negative nitrites, negative blood. Culture sent. Await results.  She may be experiencing breakthrough reflux despite pantoprazole. Will have her try OTC famotidine 20 mg daily in addition to pantoprazole to see if this is effective.   Also recommended she start the metronidazole antibiotics as prescribed by GYN, she will do so.   Await results. ED precautions provided.

## 2021-01-04 NOTE — Progress Notes (Signed)
Subjective:    Patient ID: [name], female    DOB: 07-08-1941, 80 y.o.   MRN: [id number]  HPI  [name] is a very pleasant 80 y.o. female patient of Dr. Glori Bickers with a history of GERD, RA, cholecystectomy,  prediabetes who presents today with a chief complaint of abdominal pain.  Her abdominal pain is generalized but originated to the epigastric region and has since moved to the generalized abdomen.  She describes her pain as achy for which is constant, present with and without regard to food. Also with nausea, esophageal discomfort.   She's been eating more of a bland diet. She has been staying hydrated with water. Bowel movements occur daily, is taking Miralax daily for constipation. She is passing gas.   She denies diarrhea, constipation, vomiting, bloody stools. She is following with GI through [facility] for chronic abdominal pain and GERD, last visit in July 2021, endorses compliance to pantoprazole 40 mg daily.   Evaluated by Dr. Maudie Mercury virtually on 12/18/20 for symptoms of chills, nausea, vomiting, cough. She was treated for viral illness with Zofran. Today she endorses that she never took Zofran as prescribed as she didn't know what this was for. She denies chills, vomiting, cough. She never tested herself for Covid-19 as instructed.   Evaluated by GYN three days ago for abdominal pain and vaginal discharge, vaginal spotting, wet prep positive for clue cells, metronidazole was prescribed for which she has yet to start taking as she didn't know what the medication was for. Pap smear completed, negative.   Review of Systems  Constitutional: Negative for fatigue and fever.  Gastrointestinal: Positive for abdominal pain and nausea. Negative for blood in stool, constipation, diarrhea and vomiting.         Past Medical History:  Diagnosis Date  . Allergic rhinitis   . Cataract 2015   bilateral  . COVID-19 10/2019   ASYMPTOMATIC  . Diverticulosis   . Esophageal  reflux   . Essential hypertension 01/07/2016  . Fatty liver 2018  . Fibula fracture 02/2005  . Former smoker   . Gastritis 2003  . Heart murmur    MILD, NO CARDIOLOGIST  . Hemorrhoids   . History of cardiac dysrhythmia 2011   ABLATION  AT [facility]  . History of kidney stones YRS AGO  . Hyperglycemia   . Hyperlipemia   . Osteoarthritis   . Personal history of colonic polyps 1998   hyperplastic  . PMB (postmenopausal bleeding)   . Pre-diabetes    BORDERLINE DIET CONTROLLED DOES NOT CHECK CBG  . RA (rheumatoid arthritis) ([place])   . Rectal bleed 2018   TRANSFUSION GIVEN  . Shingles 2012  . Uterine fibroid     Social History   Socioeconomic History  . Marital status: Married    Spouse name: Not on file  . Number of children: 3  . Years of education: Not on file  . Highest education level: Not on file  Occupational History  . Occupation: retired    Fish farm manager: RETIRED  Tobacco Use  . Smoking status: Former Smoker    Packs/day: 0.75    Years: 15.00    Pack years: 11.25    Types: Cigarettes    Quit date: 10/21/2003    Years since quitting: 17.2  . Smokeless tobacco: Never Used  Vaping Use  . Vaping Use: Never used  Substance and Sexual Activity  . Alcohol use: No    Alcohol/week: 0.0 standard drinks  . Drug  use: No  . Sexual activity: Yes    Comment: 1st intercourse 80 yo-Fewer than 5 partners  Other Topics Concern  . Not on file  Social History Narrative   Married      Retired      Occasional caffeine      No regular exercise   Social Determinants of Radio broadcast assistant Strain: Low Risk   . Difficulty of Paying Living Expenses: Not very hard  Food Insecurity: No Food Insecurity  . Worried About Charity fundraiser in the Last Year: Never true  . Ran Out of Food in the Last Year: Never true  Transportation Needs: No Transportation Needs  . Lack of Transportation (Medical): No  . Lack of Transportation (Non-Medical): No  Physical Activity: Inactive   . Days of Exercise per Week: 0 days  . Minutes of Exercise per Session: 0 min  Stress: No Stress Concern Present  . Feeling of Stress : Not at all  Social Connections: Not on file  Intimate Partner Violence: Not At Risk  . Fear of Current or Ex-Partner: No  . Emotionally Abused: No  . Physically Abused: No  . Sexually Abused: No    Past Surgical History:  Procedure Laterality Date  . CHOLECYSTECTOMY  YRS AGO  . COLONOSCOPY  11/08   internal hemorrhoids  . DILATATION & CURETTAGE/HYSTEROSCOPY WITH MYOSURE N/A 07/06/2020   Procedure: DILATATION & CURETTAGE/HYSTEROSCOPY;  Surgeon: Joseph Pierini, MD;  Location: [facility];  Service: Gynecology;  Laterality: N/A;  request 7:30am OR time in [place] [facility] block requests 30 minutes  . EP procedure  1994   SVT; normal echo  . ESOPHAGOGASTRODUODENOSCOPY  11/03   reactive gastropathy  . EYE SURGERY Bilateral 2017   CATARACTS  . LIPOMA EXCISION  11/2002  . SVT s/p surgery--? ablation  2011   AT [facility]  . TUBAL LIGATION  YRS AGO  . vaginal polyp excised  12/2000   benign    Family History  Problem Relation Age of Onset  . Colon cancer Father 42  . Diabetes Mother   . Heart disease Mother   . Kidney disease Mother        ESRD  . Stroke Mother   . Hypertension Brother   . Breast cancer Sister 14  . Diabetes Other        nephews  . Colon cancer Sister   . Esophageal cancer Neg Hx   . Stomach cancer Neg Hx   . Rectal cancer Neg Hx     Allergies  Allergen Reactions  . Bee Venom Anaphylaxis  . Humira [Adalimumab]     LIPS SWELLED AND RASH    Current Outpatient Medications on File Prior to Visit  Medication Sig Dispense Refill  . acetaminophen (TYLENOL) 325 MG tablet Take 650 mg by mouth every 6 (six) hours as needed.    Marland Kitchen albuterol (PROVENTIL HFA;VENTOLIN HFA) 108 (90 Base) MCG/ACT inhaler Inhale 2 puffs into the lungs every 4 (four) hours as needed for wheezing or shortness of breath. Please give spacer  if possible 1 Inhaler 3  . amLODipine (NORVASC) 5 MG tablet Take 1 tablet (5 mg total) by mouth daily. 90 tablet 3  . atorvastatin (LIPITOR) 10 MG tablet Take 0.5 tablets (5 mg total) by mouth every other day. 45 tablet 3  . docusate sodium (COLACE) 100 MG capsule Take 1 capsule (100 mg total) by mouth 2 (two) times daily. 60 capsule 0  . EPINEPHrine (EPIPEN IJ)  Inject as directed as needed. Reported on 12/05/2015    . fluticasone (FLONASE) 50 MCG/ACT nasal spray PLACE 2 SPRAYS INTO THE NOSE DAILY. (Patient taking differently: as needed.) 48 g 3  . folic acid (FOLVITE) 1 MG tablet Take 1 mg by mouth daily.    Marland Kitchen loratadine (CLARITIN) 10 MG tablet Take 10 mg by mouth daily. MAY TAKE AT HS ALSO    . methotrexate (RHEUMATREX) 2.5 MG tablet Take 15 mg by mouth once a week. Caution:Chemotherapy. Protect from light. ( Monday of each week )    . metroNIDAZOLE (FLAGYL) 500 MG tablet Take 1 tablet (500 mg total) by mouth 2 (two) times daily. 14 tablet 0  . pantoprazole (PROTONIX) 40 MG tablet Take 40 mg by mouth daily.  2  . polyethylene glycol (MIRALAX / GLYCOLAX) packet Take 17 g by mouth daily.    . predniSONE (DELTASONE) 5 MG tablet Take 5 mg by mouth daily.     No current facility-administered medications on file prior to visit.    BP (!) 148/84   Pulse 88   Temp 98.6 F (37 C) (Temporal)   Ht 5\' 7"  (1.702 m)   Wt 195 lb (88.5 kg)   SpO2 98%   BMI 30.54 kg/m  Objective:   Physical Exam Cardiovascular:     Rate and Rhythm: Normal rate and regular rhythm.  Pulmonary:     Effort: Pulmonary effort is normal.     Breath sounds: Normal breath sounds.  Abdominal:     Tenderness: There is generalized abdominal tenderness. There is no guarding.  Musculoskeletal:     Cervical back: Neck supple.  Skin:    General: Skin is warm and dry.           Assessment & Plan:      This visit occurred during the SARS-CoV-2 public health emergency.  Safety protocols were in place, including  screening questions prior to the visit, additional usage of staff PPE, and extensive cleaning of exam room while observing appropriate contact time as indicated for disinfecting solutions.

## 2021-01-04 NOTE — Patient Instructions (Addendum)
Complete xray(s) prior to leaving today. I will notify you of your results once received.  We will be in touch once we receive your urine culture test.   Start the metronidazole antibiotic prescribed by the gynecologist.   For heartburn you can try taking famotidine 20 mg at the opposite time of day as the pantoprazole.   It was a pleasure meeting you!

## 2021-01-05 LAB — URINE CULTURE
MICRO NUMBER:: 11665144
Result:: NO GROWTH
SPECIMEN QUALITY:: ADEQUATE

## 2021-02-04 ENCOUNTER — Ambulatory Visit (INDEPENDENT_AMBULATORY_CARE_PROVIDER_SITE_OTHER): Payer: Medicare Other | Admitting: Family Medicine

## 2021-02-04 ENCOUNTER — Encounter: Payer: Self-pay | Admitting: Family Medicine

## 2021-02-04 ENCOUNTER — Other Ambulatory Visit: Payer: Self-pay

## 2021-02-04 VITALS — BP 140/72 | HR 79 | Temp 98.0°F | Ht 67.0 in | Wt 197.5 lb

## 2021-02-04 DIAGNOSIS — R1084 Generalized abdominal pain: Secondary | ICD-10-CM | POA: Diagnosis not present

## 2021-02-04 DIAGNOSIS — N8110 Cystocele, unspecified: Secondary | ICD-10-CM | POA: Diagnosis not present

## 2021-02-04 LAB — HEPATIC FUNCTION PANEL
ALT: 8 U/L (ref 0–35)
AST: 19 U/L (ref 0–37)
Albumin: 3.8 g/dL (ref 3.5–5.2)
Alkaline Phosphatase: 63 U/L (ref 39–117)
Bilirubin, Direct: 0.1 mg/dL (ref 0.0–0.3)
Total Bilirubin: 0.5 mg/dL (ref 0.2–1.2)
Total Protein: 7.7 g/dL (ref 6.0–8.3)

## 2021-02-04 LAB — H. PYLORI ANTIBODY, IGG: H Pylori IgG: NEGATIVE

## 2021-02-04 LAB — BASIC METABOLIC PANEL
BUN: 9 mg/dL (ref 6–23)
CO2: 27 mEq/L (ref 19–32)
Calcium: 9.4 mg/dL (ref 8.4–10.5)
Chloride: 103 mEq/L (ref 96–112)
Creatinine, Ser: 0.84 mg/dL (ref 0.40–1.20)
GFR: 66.4 mL/min (ref >60.00)
Glucose, Bld: 103 mg/dL — ABNORMAL HIGH (ref 70–99)
Potassium: 3.7 mEq/L (ref 3.5–5.1)
Sodium: 139 mEq/L (ref 135–145)

## 2021-02-04 LAB — CBC WITH DIFFERENTIAL/PLATELET
Basophils Absolute: 0 10*3/uL (ref 0.0–0.1)
Basophils Relative: 0.5 % (ref 0.0–3.0)
Eosinophils Absolute: 0.1 10*3/uL (ref 0.0–0.7)
Eosinophils Relative: 3 % (ref 0.0–5.0)
HCT: 40.7 % (ref 36.0–46.0)
Hemoglobin: 13.5 g/dL (ref 12.0–15.0)
Lymphocytes Relative: 37.1 % (ref 12.0–46.0)
Lymphs Abs: 1.8 10*3/uL (ref 0.7–4.0)
MCHC: 33.2 g/dL (ref 30.0–36.0)
MCV: 85.4 fl (ref 78.0–100.0)
Monocytes Absolute: 0.2 10*3/uL (ref 0.1–1.0)
Monocytes Relative: 4.3 % (ref 3.0–12.0)
Neutro Abs: 2.6 10*3/uL (ref 1.4–7.7)
Neutrophils Relative %: 55.1 % (ref 43.0–77.0)
Platelets: 369 10*3/uL (ref 150.0–400.0)
RBC: 4.76 Mil/uL (ref 3.87–5.11)
RDW: 16.8 % — ABNORMAL HIGH (ref 11.5–15.5)
WBC: 4.7 10*3/uL (ref 4.0–10.5)

## 2021-02-04 NOTE — Progress Notes (Signed)
Amy Averhart T. Slayden Mennenga, MD, [facility] at [facility] [place], [address]  Phone: [phone]  FAX: [facility] - 80 y.o. female  MRN [id number]  Date of Birth: 19-Aug-1941  Date: 02/04/2021  PCP: Abner Greenspan, MD  Referral: Abner Greenspan, MD  Chief Complaint  Patient presents with  . Abdominal Pain    Seen by K.Clark 01/04/2021  . Vaginal Prolapse    This visit occurred during the SARS-CoV-2 public health emergency.  Safety protocols were in place, including screening questions prior to the visit, additional usage of staff PPE, and extensive cleaning of exam room while observing appropriate contact time as indicated for disinfecting solutions.   Subjective:   [name] is a 80 y.o. very pleasant female patient with Body mass index is 30.93 kg/m. who presents with the following:  Abdominal pain: She has had some ongoing intermittent abdominal pain what appears to be years on chart review.  She does have a GI and [facility] is actually she has been seeing them for at least several years.  Right now she has some generalized abdominal pain.  She is eating and drinking okay, she is passing flatus, she does not have any melena or bright red blood per rectum.  She is not vomiting.  She is not having diarrhea.  She had a CT of the abdomen and pelvis in May 08, 2020, as well as a ultrasound, pelvic on December 14, 2019.  Vaginal prolapse.  She also has a question about her vagina.  Question prolapse.  Review of Systems is noted in the HPI, as appropriate  Objective:   BP 140/72   Pulse 79   Temp 98 F (36.7 C) (Temporal)   Ht 5\' 7"  (1.702 m)   Wt 197 lb 8 oz (89.6 kg)   SpO2 96%   BMI 30.93 kg/m   GEN: No acute distress; alert,appropriate. PULM: Breathing comfortably in no respiratory distress PSYCH: Normally interactive.   PULM: Normal  respiratory rate, no accessory muscle use. No wheezes, crackles or rhonchi  ABD: S, moderate pain diffusely, ND, + BS, No rebound, No HSM   Laboratory and Imaging Data: Results for orders placed or performed in visit on 02/04/21  Urine Culture   Specimen: Blood  Result Value Ref Range   MICRO NUMBER: [id number]    SPECIMEN QUALITY: Adequate    Sample Source NOT GIVEN    STATUS: FINAL    ISOLATE 1:      Mixed genital flora isolated. These superficial bacteria are not indicative of a urinary tract infection. No further organism identification is warranted on this specimen. If clinically indicated, recollect clean-catch, mid-stream urine and transfer  immediately to Urine Culture Transport Tube.   Basic metabolic panel  Result Value Ref Range   Sodium 139 135 - 145 mEq/L   Potassium 3.7 3.5 - 5.1 mEq/L   Chloride 103 96 - 112 mEq/L   CO2 27 19 - 32 mEq/L   Glucose, Bld 103 (H) 70 - 99 mg/dL   BUN 9 6 - 23 mg/dL   Creatinine, Ser 0.84 0.40 - 1.20 mg/dL   GFR 66.40 >60.00 mL/min   Calcium 9.4 8.4 - 10.5 mg/dL  CBC with Differential/Platelet  Result Value Ref Range   WBC 4.7 4.0 - 10.5 K/uL   RBC 4.76 3.87 - 5.11 Mil/uL   Hemoglobin 13.5 12.0 -  15.0 g/dL   HCT 40.7 36.0 - 46.0 %   MCV 85.4 78.0 - 100.0 fl   MCHC 33.2 30.0 - 36.0 g/dL   RDW 16.8 (H) 11.5 - 15.5 %   Platelets 369.0 150.0 - 400.0 K/uL   Neutrophils Relative % 55.1 43.0 - 77.0 %   Lymphocytes Relative 37.1 12.0 - 46.0 %   Monocytes Relative 4.3 3.0 - 12.0 %   Eosinophils Relative 3.0 0.0 - 5.0 %   Basophils Relative 0.5 0.0 - 3.0 %   Neutro Abs 2.6 1.4 - 7.7 K/uL   Lymphs Abs 1.8 0.7 - 4.0 K/uL   Monocytes Absolute 0.2 0.1 - 1.0 K/uL   Eosinophils Absolute 0.1 0.0 - 0.7 K/uL   Basophils Absolute 0.0 0.0 - 0.1 K/uL  Hepatic function panel  Result Value Ref Range   Total Bilirubin 0.5 0.2 - 1.2 mg/dL   Bilirubin, Direct 0.1 0.0 - 0.3 mg/dL   Alkaline Phosphatase 63 39 - 117 U/L   AST 19 0 - 37 U/L   ALT 8 0 -  35 U/L   Total Protein 7.7 6.0 - 8.3 g/dL   Albumin 3.8 3.5 - 5.2 g/dL  H. pylori antibody, IgG  Result Value Ref Range   H Pylori IgG Negative Negative     Assessment and Plan:     ICD-10-CM   1. Generalized abdominal pain  O35.00 Basic metabolic panel    CBC with Differential/Platelet    Hepatic function panel    H. pylori antibody, IgG    CT Abdomen Pelvis W Contrast    Urinalysis, Routine w reflex microscopic    Urine Culture   Cute on chronic abdominal pain with exacerbation.  This appears to been worked up from a chronic standpoint well from her GI at [facility].  She is clinically worsening per her history and she has an abdominal pain.  I think that it is reasonable to get a CT of the abdomen and pelvis with contrast to evaluate for potential ileus, bowel obstruction, or possible diverticulitis.  Labs are reassuring.  She also has some vaginal complaints.  I am not equipped to handle these issues from a fund of knowledge standpoint, and I am going to have her see her gynecologist or discuss this with her primary care doctor at her next evaluation.  Orders Placed This Encounter  Procedures  . Urine Culture  . CT Abdomen Pelvis W Contrast  . Basic metabolic panel  . CBC with Differential/Platelet  . Hepatic function panel  . H. pylori antibody, IgG  . Urinalysis, Routine w reflex microscopic    Follow-up: No follow-ups on file.  Signed,  Maud Deed. Devlin Mcveigh, MD   Outpatient Encounter Medications as of 02/04/2021  Medication Sig  . acetaminophen (TYLENOL) 325 MG tablet Take 650 mg by mouth every 6 (six) hours as needed.  Marland Kitchen albuterol (PROVENTIL HFA;VENTOLIN HFA) 108 (90 Base) MCG/ACT inhaler Inhale 2 puffs into the lungs every 4 (four) hours as needed for wheezing or shortness of breath. Please give spacer if possible  . amLODipine (NORVASC) 5 MG tablet Take 1 tablet (5 mg total) by mouth daily.  Marland Kitchen atorvastatin (LIPITOR) 10 MG tablet Take 0.5 tablets (5 mg total) by  mouth every other day.  . docusate sodium (COLACE) 100 MG capsule Take 1 capsule (100 mg total) by mouth 2 (two) times daily.  Marland Kitchen EPINEPHrine (EPIPEN IJ) Inject as directed as needed. Reported on 12/05/2015  . fluticasone (FLONASE) 50 MCG/ACT nasal  spray PLACE 2 SPRAYS INTO THE NOSE DAILY. (Patient taking differently: as needed.)  . folic acid (FOLVITE) 1 MG tablet Take 1 mg by mouth daily.  Marland Kitchen loratadine (CLARITIN) 10 MG tablet Take 10 mg by mouth daily. MAY TAKE AT HS ALSO  . methotrexate (RHEUMATREX) 2.5 MG tablet Take 15 mg by mouth once a week. Caution:Chemotherapy. Protect from light. ( Monday of each week )  . pantoprazole (PROTONIX) 40 MG tablet Take 40 mg by mouth daily.  . polyethylene glycol (MIRALAX / GLYCOLAX) packet Take 17 g by mouth daily.  . predniSONE (DELTASONE) 5 MG tablet Take 5 mg by mouth daily.  . metroNIDAZOLE (FLAGYL) 500 MG tablet Take 1 tablet (500 mg total) by mouth 2 (two) times daily. (Patient not taking: Reported on 02/04/2021)   No facility-administered encounter medications on file as of 02/04/2021.

## 2021-02-05 LAB — URINE CULTURE
MICRO NUMBER:: 11781469
SPECIMEN QUALITY:: ADEQUATE

## 2021-02-15 ENCOUNTER — Ambulatory Visit
Admission: RE | Admit: 2021-02-15 | Discharge: 2021-02-15 | Disposition: A | Discharge status: Home / Self Care | Payer: Medicare Other | Source: Ambulatory Visit | Attending: Family Medicine | Admitting: Family Medicine

## 2021-02-15 ENCOUNTER — Other Ambulatory Visit: Payer: Self-pay

## 2021-02-15 DIAGNOSIS — K5730 Diverticulosis of large intestine without perforation or abscess without bleeding: Secondary | ICD-10-CM | POA: Diagnosis not present

## 2021-02-15 DIAGNOSIS — I700 Atherosclerosis of aorta: Secondary | ICD-10-CM | POA: Diagnosis not present

## 2021-02-15 DIAGNOSIS — Z9049 Acquired absence of other specified parts of digestive tract: Secondary | ICD-10-CM | POA: Diagnosis not present

## 2021-02-15 DIAGNOSIS — R1084 Generalized abdominal pain: Secondary | ICD-10-CM

## 2021-02-15 MED ORDER — IOPAMIDOL (ISOVUE-370) INJECTION 76%
80.0000 mL | Freq: Once | INTRAVENOUS | Status: AC | PRN
  Administered 2021-02-15: 80 mL via INTRAVENOUS

## 2021-02-19 ENCOUNTER — Encounter: Payer: Self-pay | Admitting: *Deleted

## 2021-03-19 DIAGNOSIS — M0579 Rheumatoid arthritis with rheumatoid factor of multiple sites without organ or systems involvement: Secondary | ICD-10-CM | POA: Diagnosis not present

## 2021-03-19 DIAGNOSIS — M150 Primary generalized (osteo)arthritis: Secondary | ICD-10-CM | POA: Diagnosis not present

## 2021-04-19 ENCOUNTER — Encounter: Payer: Self-pay | Admitting: Family Medicine

## 2021-04-19 ENCOUNTER — Other Ambulatory Visit: Payer: Self-pay | Admitting: Family Medicine

## 2021-04-19 ENCOUNTER — Other Ambulatory Visit: Payer: Medicare Other

## 2021-04-19 ENCOUNTER — Telehealth (INDEPENDENT_AMBULATORY_CARE_PROVIDER_SITE_OTHER): Payer: Medicare Other | Admitting: Family Medicine

## 2021-04-19 DIAGNOSIS — J069 Acute upper respiratory infection, unspecified: Secondary | ICD-10-CM

## 2021-04-19 NOTE — Progress Notes (Signed)
Virtual Visit via Video Note  I connected with [name] on 04/19/21 at  2:00 PM EDT by a video enabled telemedicine application and verified that I am speaking with the correct person using two identifiers.  Location: Patient: home Provider: office   I discussed the limitations of evaluation and management by telemedicine and the availability of in person appointments. The patient expressed understanding and agreed to proceed.  Parties involved in encounter  Patient: [name]  Provider:  Loura Pardon MD   Video failed today so visit done by phone  History of Present Illness: C/o uri symptoms   Started coughing last week at night   Dry Wheezed briefly -better  No sob at all  Nose runs a lot -worse on the L  Sometimes a mild headache Ears-nl  Throat- not sore  Clear d/c No n/v/d    Sort of achey (but has RA)  No fever  No chills    Otc:  Delsym - helps   Immunized for covid  No sick contacts   Patient Active Problem List   Diagnosis Date Noted   Viral URI with cough 04/19/2021   Hyperlipidemia 10/15/2020   Pre-op exam 06/01/2020   Fatigue 01/30/2020   TMJ (dislocation of temporomandibular joint) 04/29/2019   Encounter for screening mammogram for breast cancer 10/27/2017   Routine general medical examination at a health care facility 10/27/2017   Generalized abdominal pain 03/31/2017   Aortic atherosclerosis ([place]) 02/03/2017   RA (rheumatoid arthritis) ([place])    Prediabetes    Adjustment disorder with mixed anxiety and depressed mood 06/24/2016   Essential hypertension 01/07/2016   Coronary atherosclerosis 11/07/2015   Osteoarthritis 05/10/2014   GERD 07/09/2007   Past Medical History:  Diagnosis Date   Allergic rhinitis    Cataract 2015   bilateral   COVID-19 10/2019   ASYMPTOMATIC   Diverticulosis    Esophageal reflux    Essential hypertension 01/07/2016   Fatty liver 2018   Fibula fracture 02/2005   Former smoker    Gastritis 2003    Heart murmur    MILD, NO CARDIOLOGIST   Hemorrhoids    History of cardiac dysrhythmia 2011   ABLATION  AT [facility]   History of kidney stones YRS AGO   Hyperglycemia    Hyperlipemia    Osteoarthritis    Personal history of colonic polyps 1998   hyperplastic   PMB (postmenopausal bleeding)    Pre-diabetes    BORDERLINE DIET CONTROLLED DOES NOT CHECK CBG   RA (rheumatoid arthritis) ([place])    Rectal bleed 2018   TRANSFUSION GIVEN   Shingles 2012   Uterine fibroid    Past Surgical History:  Procedure Laterality Date   CHOLECYSTECTOMY  YRS AGO   COLONOSCOPY  11/08   internal hemorrhoids   DILATATION & CURETTAGE/HYSTEROSCOPY WITH MYOSURE N/A 07/06/2020   Procedure: DILATATION & CURETTAGE/HYSTEROSCOPY;  Surgeon: Joseph Pierini, MD;  Location: [facility];  Service: Gynecology;  Laterality: N/A;  request 7:30am OR time in [place] block requests 30 minutes   EP procedure  1994   SVT; normal echo   ESOPHAGOGASTRODUODENOSCOPY  11/03   reactive gastropathy   EYE SURGERY Bilateral 2017   CATARACTS   LIPOMA EXCISION  11/2002   SVT s/p surgery--? ablation  2011   AT [facility] AGO   vaginal polyp excised  12/2000   benign   Social History   Tobacco Use   Smoking status: Former  Packs/day: 0.75    Years: 15.00    Pack years: 11.25    Types: Cigarettes    Quit date: 10/21/2003    Years since quitting: 17.5   Smokeless tobacco: Never  Vaping Use   Vaping Use: Never used  Substance Use Topics   Alcohol use: No    Alcohol/week: 0.0 standard drinks   Drug use: No   Family History  Problem Relation Age of Onset   Colon cancer Father 80   Diabetes Mother    Heart disease Mother    Kidney disease Mother        ESRD   Stroke Mother    Hypertension Brother    Breast cancer Sister 21   Diabetes Other        nephews   Colon cancer Sister    Esophageal cancer Neg Hx    Stomach cancer Neg Hx    Rectal cancer Neg Hx    Allergies   Allergen Reactions   Bee Venom Anaphylaxis   Humira [Adalimumab]     LIPS SWELLED AND RASH   Current Outpatient Medications on File Prior to Visit  Medication Sig Dispense Refill   acetaminophen (TYLENOL) 325 MG tablet Take 650 mg by mouth every 6 (six) hours as needed.     albuterol (PROVENTIL HFA;VENTOLIN HFA) 108 (90 Base) MCG/ACT inhaler Inhale 2 puffs into the lungs every 4 (four) hours as needed for wheezing or shortness of breath. Please give spacer if possible 1 Inhaler 3   amLODipine (NORVASC) 5 MG tablet Take 1 tablet (5 mg total) by mouth daily. 90 tablet 3   atorvastatin (LIPITOR) 10 MG tablet Take 0.5 tablets (5 mg total) by mouth every other day. 45 tablet 3   docusate sodium (COLACE) 100 MG capsule Take 1 capsule (100 mg total) by mouth 2 (two) times daily. 60 capsule 0   EPINEPHrine (EPIPEN IJ) Inject as directed as needed. Reported on 12/05/2015     fluticasone (FLONASE) 50 MCG/ACT nasal spray PLACE 2 SPRAYS INTO THE NOSE DAILY. (Patient taking differently: as needed.) 48 g 3   folic acid (FOLVITE) 1 MG tablet Take 1 mg by mouth daily.     loratadine (CLARITIN) 10 MG tablet Take 10 mg by mouth daily. MAY TAKE AT HS ALSO     methotrexate (RHEUMATREX) 2.5 MG tablet Take 15 mg by mouth once a week. Caution:Chemotherapy. Protect from light. ( Monday of each week )     metroNIDAZOLE (FLAGYL) 500 MG tablet Take 1 tablet (500 mg total) by mouth 2 (two) times daily. 14 tablet 0   pantoprazole (PROTONIX) 40 MG tablet Take 40 mg by mouth daily.  2   polyethylene glycol (MIRALAX / GLYCOLAX) packet Take 17 g by mouth daily.     predniSONE (DELTASONE) 5 MG tablet Take 5 mg by mouth daily.     sulfaSALAzine (AZULFIDINE) 500 MG tablet Take 500 mg by mouth 2 (two) times daily.     No current facility-administered medications on file prior to visit.   Review of Systems  Constitutional:  Positive for malaise/fatigue. Negative for chills and fever.  HENT:  Positive for congestion. Negative  for ear pain, sinus pain and sore throat.   Eyes:  Negative for blurred vision, discharge and redness.  Respiratory:  Positive for cough and wheezing. Negative for sputum production, shortness of breath and stridor.        Wheezed once  No sob  Cardiovascular:  Negative for chest pain, palpitations and leg  swelling.  Gastrointestinal:  Negative for abdominal pain, diarrhea, nausea and vomiting.  Musculoskeletal:  Negative for myalgias.  Skin:  Negative for rash.  Neurological:  Negative for dizziness and headaches.     Observations/Objective: Pt sounds well, like her usual self and in no distress A bit hoarse Clears throat occ  No audible cough or sob or wheeze  Nl cognition, good historian  Nl mood    Assessment and Plan: Problem List Items Addressed This Visit       Respiratory   Viral URI with cough    Mild symptoms, taking delsym and no fever  covid test ordered and pt will isolate until result  She had covid in jan and also is immunized with booster  She is immunocomp with tx for RA Will watch closely  ER precautions discussed Disc symptom care  Delsym prn  Update if not starting to improve in a week or if worsening           Follow Up Instructions: Drink fluids and rest  Take delsym as needed for cough  The office will call to set up testing for covid Isolate yourself until that returns and you feel better Update if not starting to improve in a week or if worsening  (Especially if wheezing or short of breath) If severe, go to the ER   I discussed the assessment and treatment plan with the patient. The patient was provided an opportunity to ask questions and all were answered. The patient agreed with the plan and demonstrated an understanding of the instructions.   The patient was advised to call back or seek an in-person evaluation if the symptoms worsen or if the condition fails to improve as anticipated.  I provided 18 minutes of non-face-to-face time  during this encounter.   Loura Pardon, MD

## 2021-04-19 NOTE — Patient Instructions (Signed)
Drink fluids and rest  Take delsym as needed for cough  The office will call to set up testing for covid Isolate yourself until that returns and you feel better Update if not starting to improve in a week or if worsening  (Especially if wheezing or short of breath) If severe, go to the ER

## 2021-04-19 NOTE — Assessment & Plan Note (Signed)
Mild symptoms, taking delsym and no fever  covid test ordered and pt will isolate until result  She had covid in [place] and also is immunized with booster  She is immunocomp with tx for RA Will watch closely  ER precautions discussed Disc symptom care  Delsym prn  Update if not starting to improve in a week or if worsening

## 2021-04-20 LAB — NOVEL CORONAVIRUS, NAA: SARS-CoV-2, NAA: NOT DETECTED

## 2021-04-20 LAB — SARS-COV-2, NAA 2 DAY TAT

## 2021-05-09 ENCOUNTER — Ambulatory Visit (INDEPENDENT_AMBULATORY_CARE_PROVIDER_SITE_OTHER): Payer: Medicare Other | Admitting: Family Medicine

## 2021-05-09 ENCOUNTER — Encounter: Payer: Self-pay | Admitting: Family Medicine

## 2021-05-09 ENCOUNTER — Other Ambulatory Visit: Payer: Self-pay

## 2021-05-09 DIAGNOSIS — R1084 Generalized abdominal pain: Secondary | ICD-10-CM | POA: Diagnosis not present

## 2021-05-09 MED ORDER — DICYCLOMINE HCL 10 MG/5ML PO SOLN: 2.0000 mg | Freq: Three times a day (TID) | ORAL | 1 refills | Status: DC

## 2021-05-09 NOTE — Patient Instructions (Signed)
Try taking 61mL of dicyclomine and see if you get relief of abdominal pain without getting vision changes. If needed try 83mL.   Let us know if that helps and if you tolerate it.  If not better, we likely need to get you back to see the GI clinic.  Take care.  Glad to see you.

## 2021-05-09 NOTE — Progress Notes (Signed)
This visit occurred during the SARS-CoV-2 public health emergency.  Safety protocols were in place, including screening questions prior to the visit, additional usage of staff PPE, and extensive cleaning of exam room while observing appropriate contact time as indicated for disinfecting solutions.  Prev GI note: She was first seen in 2018 with Dr. Roney Mans for abdominal pain and weight loss. She had seen several other GIs for this over the prior years and had a fairly extensive outside workup including a normal EGD in 2017, colonoscopy in 2017 revealing diverticulosis and a 5 mm polyp, and CT scan in 2018 revealing hepatic steatosis and atherosclerosis. She had a repeat EGD in 2018 that showed a small hiatal hernia. 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 She was treated with PPI. She was intolerant to dicyclomine secondary to blurred vision.   Prev pelvic u/s:  Anteverted uterus 6.8 x 4.0 x 4.5 cm. Multiple small fibroids stable in appearance. Symmetrical endometrium 0.85 mm. No masses or thickening. Bilateral ovaries are better visualized today versus previous examination.  Left ovary with residual 6 mm avascular simple follicle.  Right ovary normal. No adnexal masses.  No free fluid.  Prev CT with  Colonic diverticulosis, without radiographic evidence of diverticulitis or other acute findings. 1.5 cm calcified uterine fibroid. Aortic Atherosclerosis (ICD10-I70.0).   Pain now but less severe now compared to prev.  Pain is up and down, but never goes away.  On PPI per GI.    She stopped sulfasalazine due to abd pain.  Still on MTX and prednisone.    Pain is worse after eating, it doesn't matter what she eats.  H/o cholecystectomy.  She didn't tolerate dicyclomine but to vision changes but it may have helped GI sx.    She had scant VB s/p endometrial biopsy.  No blood in stools recently but h/o diverticular bleed years ago.    Meds, vitals,  and allergies reviewed.   ROS: Per HPI unless specifically indicated in ROS section   GEN: nad, alert and oriented HEENT: ncat NECK: supple w/o LA CV: rrr.   PULM: ctab, no inc wob ABD: soft, +bs, not tender (no symptoms of time of exam) EXT: no edema SKIN: no acute rash  33 minutes were devoted to patient care in this encounter (this includes time spent reviewing the patient's file/history, interviewing and examining the patient, counseling/reviewing plan with patient).

## 2021-05-12 NOTE — Assessment & Plan Note (Signed)
She has had extensive work-up from before.  No symptoms currently.  I do not suspect an ominous diagnosis but I think is worth considering if she has IBS.  She may not have tolerated the standard dose of dicyclomine but she may get relief at a lower dose and also tolerate the medication.  Rationale discussed with patient.  I asked her to try taking 71m of dicyclomine and see if she gets relief of abdominal pain without getting vision changes. If needed try 26m   I asked her to let usKoreanow if that helps and if she tolerates it.  If not better, we likely need to get her back to see the GI clinic.   Routed to PCP as FYI.  33 minutes were devoted to patient care in this encounter (this includes time spent reviewing the patient's file/history, interviewing and examining the patient, counseling/reviewing plan with patient).

## 2021-05-13 ENCOUNTER — Ambulatory Visit: Payer: Medicare Other | Admitting: Family Medicine

## 2021-05-28 ENCOUNTER — Telehealth: Payer: Self-pay

## 2021-05-28 NOTE — Chronic Care Management (AMB) (Addendum)
    Chronic Care Management Pharmacy Assistant   Name: [name]  MRN: [id number] DOB: January 08, 1941  Reason for Encounter: CCM Reminder Call   Conditions to be addressed/monitored: HTN and HLD   Medications: Outpatient Encounter Medications as of 05/28/2021  Medication Sig   acetaminophen (TYLENOL) 325 MG tablet Take 650 mg by mouth every 6 (six) hours as needed.   albuterol (PROVENTIL HFA;VENTOLIN HFA) 108 (90 Base) MCG/ACT inhaler Inhale 2 puffs into the lungs every 4 (four) hours as needed for wheezing or shortness of breath. Please give spacer if possible   amLODipine (NORVASC) 5 MG tablet Take 1 tablet (5 mg total) by mouth daily.   atorvastatin (LIPITOR) 10 MG tablet Take 0.5 tablets (5 mg total) by mouth every other day.   dicyclomine (BENTYL) 10 MG/5ML solution Take 1-2 mLs (2-4 mg total) by mouth 4 (four) times daily -  before meals and at bedtime. Prev with blurry vision on higher dose but not an allergy and may be able to tolerate lower dose.   docusate sodium (COLACE) 100 MG capsule Take 1 capsule (100 mg total) by mouth 2 (two) times daily.   EPINEPHrine (EPIPEN IJ) Inject as directed as needed. Reported on 12/05/2015   fluticasone (FLONASE) 50 MCG/ACT nasal spray PLACE 2 SPRAYS INTO THE NOSE DAILY. (Patient taking differently: as needed.)   folic acid (FOLVITE) 1 MG tablet Take 1 mg by mouth daily.   loratadine (CLARITIN) 10 MG tablet Take 10 mg by mouth daily. MAY TAKE AT HS ALSO   methotrexate (RHEUMATREX) 2.5 MG tablet Take 15 mg by mouth once a week. Caution:Chemotherapy. Protect from light. ( Monday of each week )   pantoprazole (PROTONIX) 40 MG tablet Take 40 mg by mouth daily.   polyethylene glycol (MIRALAX / GLYCOLAX) packet Take 17 g by mouth daily.   predniSONE (DELTASONE) 5 MG tablet Take 5 mg by mouth daily.   No facility-administered encounter medications on file as of 05/28/2021.   [name] was contacted to remind her of her upcoming telephone visit  with Debbora Dus on 06/03/2021 at 10:00am. Patient was reminded to have all medications, supplements and any and blood pressure readings available for review at appointment.   Are you having any problems with your medications? No the patient reports [facility] uses [facility] [place]  Do you have any concerns you like to discuss with the pharmacist? No   Star Rating Drugs: Medication:  Last Fill: Day Supply Atorvastatin '10mg'$  03/29/21 [place], CPP notified  Avel Sensor, [place] Assistant [phone]  I have reviewed the care management and care coordination activities outlined in this encounter and I am certifying that I agree with the content of this note. No further action required.  Debbora Dus, PharmD Clinical Pharmacist [facility] at [facility] [phone]

## 2021-06-03 ENCOUNTER — Ambulatory Visit (INDEPENDENT_AMBULATORY_CARE_PROVIDER_SITE_OTHER): Payer: Medicare Other

## 2021-06-03 ENCOUNTER — Other Ambulatory Visit: Payer: Self-pay

## 2021-06-03 DIAGNOSIS — E7800 Pure hypercholesterolemia, unspecified: Secondary | ICD-10-CM

## 2021-06-03 DIAGNOSIS — I10 Essential (primary) hypertension: Secondary | ICD-10-CM | POA: Diagnosis not present

## 2021-06-03 NOTE — Progress Notes (Signed)
Chronic Care Management Pharmacy Note  06/03/2021 Name:  [name] MRN:  [id number] DOB:  May 23, 1941  Subjective: [name] is an 80 y.o. year old female who is a primary patient of Tower, Wynelle Fanny, MD.  The CCM team was consulted for assistance with disease management and care coordination needs.    Engaged with patient by telephone for follow up visit in response to provider referral for pharmacy case management and/or care coordination services. Stomach pain has improved with dicyclomine, taking about every other day. No vision concerns. She also takes pantoprazole daily.  Consent to Services:  The patient was given information about Chronic Care Management services, agreed to services, and gave verbal consent prior to initiation of services.  Please see initial visit note for detailed documentation.   Patient Care Team: Tower, Wynelle Fanny, MD as PCP - General Fay Records, MD as Consulting Physician (Cardiology) Ladene Artist, MD as Consulting Physician (Gastroenterology) Lorelee Cover., MD as Consulting Physician (Ophthalmology) Debbora Dus, [facility] as Pharmacist (Pharmacist)  Recent office visits: 05/09/21 - Elsie Stain, MD - Pt presented for abdominal pain. Try 1 mL dicyclomine. May increase to 54m if needed. Watch for vision changes. Will need to see GI if no improvement. 04/19/21 - PCP - Pt presented for televisit for viral URI with cough. Recommend fluids, rest and OTC Delsym for cough.  02/04/21 - SOwens Loffler MD - Pt presented for abdominal pain. Order CT abdomen. Follow up with OBGYN. 01/04/21 - KAlma Friendly NP - Pt presented for abdominal pain.  Recent consult visits: 01/01/21 - Gynecology - Bacterial vaginitis, treat with Flagyl. Follow up 3 months.   Hospital visits: None in previous 6 months  Objective:  Lab Results  Component Value Date   CREATININE 0.84 02/04/2021   BUN 9 02/04/2021   GFR 66.40 02/04/2021   GFRNONAA >60 01/26/2017    GFRAA >60 01/26/2017   NA 139 02/04/2021   K 3.7 02/04/2021   CALCIUM 9.4 02/04/2021   CO2 27 02/04/2021    Lab Results  Component Value Date/Time   HGBA1C 6.5 11/13/2020 12:09 PM   HGBA1C 6.3 01/30/2020 11:38 AM   GFR 66.40 02/04/2021 11:42 AM   GFR 65.57 11/13/2020 12:09 PM   MICROALBUR 0.9 10/29/2018 11:05 AM   MICROALBUR 2.6 (H) 10/23/2017 10:48 AM    Lab Results  Component Value Date   CHOL 172 11/13/2020   HDL 52.60 11/13/2020   LDLCALC 80 11/13/2020   LDLDIRECT 60.0 10/26/2018   TRIG 197.0 (H) 11/13/2020   CHOLHDL 3 11/13/2020   Hepatic Function Latest Ref Rng & Units 02/04/2021 11/13/2020 04/03/2020  Total Protein 6.0 - 8.3 g/dL 7.7 7.6 7.8  Albumin 3.5 - 5.2 g/dL 3.8 4.1 4.3  AST 0 - 37 U/L _0 ALT 0 - 35 U/L _1 Alk Phosphatase 39 - 117 U/L 63 62 64  Total Bilirubin 0.2 - 1.2 mg/dL 0.5 0.4 0.4  Bilirubin, Direct 0.0 - 0.3 mg/dL 0.1 - -   Lab Results  Component Value Date/Time   TSH 0.96 11/13/2020 12:09 PM   TSH 1.72 01/30/2020 11:38 AM   FREET4 0.77 04/27/2014 10:02 AM   CBC Latest Ref Rng & Units 02/04/2021 11/13/2020 07/06/2020  WBC 4.0 - 10.5 K/uL 4.7 5.8 -  Hemoglobin 12.0 - 15.0 g/dL 13.5 13.6 14.3  Hematocrit 36.0 - 46.0 % 40.7 41.2 42.0  Platelets 150.0 - 400.0 K/uL 369.0 283.0 -   DEXA 2017 Normal  No results found for: VD25OH  Clinical ASCVD: No  The 10-year ASCVD risk score Mikey Bussing DC Jr., et al., 2013) is: 34.1%   Values used to calculate the score:     Age: 25 years     Sex: Female     Is Non-Hispanic African American: Yes     Diabetic: Yes     Tobacco smoker: No     Systolic Blood Pressure: 413 mmHg     Is BP treated: Yes     HDL Cholesterol: 52.6 mg/dL     Total Cholesterol: 172 mg/dL    Depression screen [facility] 2/9 11/09/2020 01/30/2020 01/30/2020  Decreased Interest 0 0 1  Down, Depressed, Hopeless 0 0 0  PHQ - 2 Score 0 0 1  Altered sleeping 0 0 -  Tired, decreased energy 0 0 -  Change in appetite 0 0 -  Feeling bad or  failure about yourself  0 0 -  Trouble concentrating 0 0 -  Moving slowly or fidgety/restless 0 0 -  Suicidal thoughts 0 0 -  PHQ-9 Score 0 0 -  Difficult doing work/chores Not difficult at all Not difficult at all -  Some recent data might be hidden    Social History   Tobacco Use  Smoking Status Former   Packs/day: 0.75   Years: 15.00   Pack years: 11.25   Types: Cigarettes   Quit date: 10/21/2003   Years since quitting: 17.6  Smokeless Tobacco Never   BP Readings from Last 3 Encounters:  05/09/21 130/80  02/04/21 140/72  01/04/21 (!) 148/84   Pulse Readings from Last 3 Encounters:  05/09/21 91  02/04/21 79  01/04/21 88   Wt Readings from Last 3 Encounters:  05/09/21 194 lb (88 kg)  04/19/21 195 lb (88.5 kg)  02/04/21 197 lb 8 oz (89.6 kg)   Assessment/Interventions: Review of patient past medical history, allergies, medications, health status, including review of consultants reports, laboratory and other test data, was performed as part of comprehensive evaluation and provision of chronic care management services.   SDOH:  (Social Determinants of Health) assessments and interventions performed: Yes - at initial visit 11/2020    CCM Care Plan  Allergies  Allergen Reactions   Bee Venom Anaphylaxis   Dicyclomine     Blurry vision   Humira [Adalimumab]     LIPS SWELLED AND RASH    Medications Reviewed Today     Reviewed by Debbora Dus, [facility] (Pharmacist) on 06/03/21 at 1241  Med List Status: <None>   Medication Order Taking? Sig Documenting Provider Last Dose Status Informant  acetaminophen (TYLENOL) 325 MG tablet [id number] Yes Take 650 mg by mouth every 6 (six) hours as needed. [provider] Taking Active Self  albuterol (PROVENTIL HFA;VENTOLIN HFA) 108 (90 Base) MCG/ACT inhaler [id number] Yes Inhale 2 puffs into the lungs every 4 (four) hours as needed for wheezing or shortness of breath. Please give spacer if possible Tower, Wynelle Fanny, MD Taking  Active Self  amLODipine (NORVASC) 5 MG tablet [id number] Yes Take 1 tablet (5 mg total) by mouth daily. Tower, Wynelle Fanny, MD Taking Active   atorvastatin (LIPITOR) 10 MG tablet [id number] Yes Take 0.5 tablets (5 mg total) by mouth every other day. Tower, Wynelle Fanny, MD Taking Active   dicyclomine (BENTYL) 10 MG/5ML solution [id number] Yes Take 1-2 mLs (2-4 mg total) by mouth 4 (four) times daily -  before meals and at bedtime. Prev with blurry vision on higher dose but not an  allergy and may be able to tolerate lower dose. Tonia Ghent, MD Taking Active   docusate sodium (COLACE) 100 MG capsule [id number] Yes Take 1 capsule (100 mg total) by mouth 2 (two) times daily. Bonnielee Haff, MD Taking Active Self  EPINEPHrine [facility] IJ) [id number] Yes Inject as directed as needed. Reported on 12/05/2015 [provider] Taking Active Self  fluticasone (FLONASE) 50 MCG/ACT nasal spray [id number] Yes PLACE 2 SPRAYS INTO THE NOSE DAILY.  Patient taking differently: as needed.   Tower, Wynelle Fanny, MD Taking Active   folic acid (FOLVITE) 1 MG tablet [id number] Yes Take 1 mg by mouth daily. [provider] Taking Active Self  loratadine (CLARITIN) 10 MG tablet [id number] Yes Take 10 mg by mouth daily. MAY TAKE AT [facility] ALSO [provider] Taking Active Self  methotrexate (RHEUMATREX) 2.5 MG tablet [id number] Yes Take 15 mg by mouth once a week. Caution:Chemotherapy. Protect from light. ( Monday of each week ) [provider] Taking Active Self  pantoprazole (PROTONIX) 40 MG tablet [id number] Yes Take 40 mg by mouth daily. [provider] Taking Active Self  polyethylene glycol (MIRALAX / GLYCOLAX) packet [id number]  Take 17 g by mouth daily. [provider]  Active Self  predniSONE (DELTASONE) 5 MG tablet [id number] Yes Take 5 mg by mouth daily. 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   Encounter for screening mammogram for breast cancer 10/27/2017   Routine general medical examination at a health care facility 10/27/2017   Generalized abdominal pain 03/31/2017   Aortic atherosclerosis ([place]) 02/03/2017   RA (rheumatoid arthritis) ([place])    Prediabetes    Adjustment disorder with mixed anxiety and depressed mood 06/24/2016   Essential hypertension 01/07/2016   Coronary atherosclerosis 11/07/2015   Osteoarthritis 05/10/2014   GERD 07/09/2007    Immunization History  Administered Date(s) Administered   Fluad Quad(high Dose 65+) 08/14/2020   Influenza,inj,Quad PF,6+ Mos 08/10/2015, 08/28/2016, 10/27/2017, 07/27/2018   PFIZER(Purple Top)SARS-COV-2 Vaccination 12/18/2019, 01/17/2020, 10/18/2020   Pneumococcal Conjugate-13 01/03/2016   Pneumococcal Polysaccharide-23 04/24/2014   Td 09/04/2006    Conditions to be addressed/monitored:  Hypertension, Hyperlipidemia and GERD  Care Plan : [place]  Updates made by Debbora Dus, [place] since 06/03/2021 12:00 AM     Problem: Hypertension, Hyperlipidemia and GERD   Priority: High  Onset Date: 12/04/2020  Note:   Current Barriers:  None identified  Pharmacist Clinical Goal(s):  Patient will contact provider office for questions/concerns as evidenced notation of same in electronic health record through collaboration with PharmD and provider.   Interventions: 1:1 collaboration with Tower, Wynelle Fanny, MD regarding development and update of comprehensive plan of care as evidenced by provider attestation and co-signature Inter-disciplinary care team collaboration (see longitudinal plan of care) Comprehensive medication review performed; medication list updated in electronic medical record  Hypertension (BP goal <140/90) -Controlled - clinic readings within  goal -Current treatment: Amlodipine 5 mg - 1 tablet daily -Medications previously tried: none -Current home readings: none -Current exercise habits: Walks around her house for 10 minutes or goes to the mall a couple times/month. 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  -Current  diet habits: Eats baked chicken wings, pasta/shrimp, egg sandwich, tossed salad, cabbage, turnip greens. Avoids acidic foods due to acid reflux. Considerable time spent counseling on diet.  Incorporate a healthy diet high in vegetables, fruits and whole grains with low-fat dairy products, chicken, fish, legumes, non-tropical vegetable oils and nuts. Limit intake of sweets, sugar-sweetened beverages and red meats. -Denies hypotensive/hypertensive symptoms. Denies dizziness/lightheadedness. Occasional headache.  - She feels her arthritis pain contributes to some elevations in office. Arthritis in legs limits walking. She has flares. Good and bad weeks. She has f/u scheduled end of August for RA. She takes an occasional Tylenol. She avoids NSAIDs. Tylenol helps. Usually takes at night PRN. Discussed scheduling Tylenol 500 mg TID during flare ups.  - Does not have a home monitor. Previously checked insurance and not covered. As long as clinic readings are within goal ok to just check in office. Plans to purchase one. Discussed Omron/arm cuff.  -Counseled to monitor BP at home weekly if able, document, and provide log at future appointments -Recommended calling if BP consistently above 140/90 at home.  Hyperlipidemia: (LDL goal < 70 - CAD) -Not ideally controlled, LDL increased to 80 on last check. Pt reports taking 1/2 tablet every other day. Doing well on this dose. Prior reported tolerated 1 tablet every other day so we could consider increasing dose if lifestyle modifications do not achieve LDL < 70. -Current treatment: Atorvastatin 10 mg - 1/2 tablet every other day   -Medications previously tried: daily atorvastatin -Educated on Cholesterol goals; Diet and exercise.  -Recommended to continue current medication; Recommend heart healthy diet and increased exercise, 10 min/day.  Other -Discussed daily vitamin D recommendations for age/bone health. She is not taking anything currently.  -Recommended vitamin D3 [phone] daily.  GERD (Goal: control reflux symptoms) -Stable per patient report, did not discuss today 06/03/21 -Current treatment  Pantoprazole 40 mg - 1 tablet daily MiraLAX - 1 cup of powder in water daily Antacid - Uses as needed Docusate - 1 daily -Medications previously tried: Mylanta  -Recommended to continue current medication  Patient Goals/Self-Care Activities Patient will:  - take medications as prescribed - start vitamin D3 supplement [phone] IU daily for bone health  Follow Up Plan: Telephone follow up appointment with care management team member scheduled for:  12 months -CMA call for BP and cholesterol review in 3 months      Medication Assistance: None required.  Patient affirms current coverage meets needs.  [facility]  [id number] [place] [id number]: [phone]: [phone] Medication:                Last Fill:         Day Supply Atorvastatin 175m      03/29/21            90  Uses pill box? No - lines up bottles on her TV tray Pt endorses good compliance. Wakes up around 7 AM. Skips breakfast. Takes all medicines around 12 PM, then waits 30 minutes before she eats. [facility] coordination[facility]delivery.  Care Plan and Follow Up Patient Decision:  Patient agrees to Care Plan and Follow-up.  MiDebbora DusPharmD Clinical Pharmacist [phone]

## 2021-06-03 NOTE — Patient Instructions (Signed)
Dear Tawana Scale,  Below is a summary of the goals we discussed during our follow up appointment on June 03, 2021. Please contact me anytime with questions or concerns.   Visit Information  Patient Care Plan: CCM Pharmacy Care Plan     Problem Identified: Hypertension, Hyperlipidemia and GERD   Priority: High  Onset Date: 12/04/2020  Note:   Current Barriers:  None identified  Pharmacist Clinical Goal(s):  Patient will contact provider office for questions/concerns as evidenced notation of same in electronic health record through collaboration with PharmD and provider.   Interventions: 1:1 collaboration with Tower, Wynelle Fanny, MD regarding development and update of comprehensive plan of care as evidenced by provider attestation and co-signature Inter-disciplinary care team collaboration (see longitudinal plan of care) Comprehensive medication review performed; medication list updated in electronic medical record  Hypertension (BP goal <140/90) -Controlled - clinic readings within goal -Current treatment: Amlodipine 5 mg - 1 tablet daily -Medications previously tried: none -Current home readings: none -Current exercise habits: Walks around her house for 10 minutes or goes to the mall a couple times/month. 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  -Current diet habits: Eats baked chicken wings, pasta/shrimp, egg sandwich, tossed salad, cabbage, turnip greens. Avoids acidic foods due to acid reflux. Considerable time spent counseling on diet.  Incorporate a healthy diet high in vegetables, fruits and whole grains with low-fat dairy products, chicken, fish, legumes, non-tropical vegetable oils and nuts. Limit intake of sweets, sugar-sweetened beverages and red meats. -Denies hypotensive/hypertensive symptoms. Denies dizziness/lightheadedness. Occasional headache.  - She feels her arthritis pain contributes to some elevations  in office. Arthritis in legs limits walking. She has flares. Good and bad weeks. She has f/u scheduled end of August for RA. She takes an occasional Tylenol. She avoids NSAIDs. Tylenol helps. Usually takes at night PRN. Discussed scheduling Tylenol 500 mg TID during flare ups.  - Does not have a home monitor. Previously checked insurance and not covered. As long as clinic readings are within goal ok to just check in office. Plans to purchase one. Discussed Omron/arm cuff.  -Counseled to monitor BP at home weekly if able, document, and provide log at future appointments -Recommended calling if BP consistently above 140/90 at home.  Hyperlipidemia: (LDL goal < 70 - CAD) -Not ideally controlled, LDL increased to 80 on last check. Pt reports taking 1/2 tablet every other day. Doing well on this dose. Prior reported tolerated 1 tablet every other day so we could consider increasing dose if lifestyle modifications do not achieve LDL < 70. -Current treatment: Atorvastatin 10 mg - 1/2 tablet every other day  -Medications previously tried: daily atorvastatin -Educated on Cholesterol goals; Diet and exercise.  -Recommended to continue current medication; Recommend heart healthy diet and increased exercise, 10 min/day.  Other -Discussed daily vitamin D recommendations for age/bone health. She is not taking anything currently.  -Recommended vitamin D3 [phone] IU daily.  GERD (Goal: control reflux symptoms) -Stable per patient report, did not discuss today 06/03/21 -Current treatment  Pantoprazole 40 mg - 1 tablet daily MiraLAX - 1 cup of powder in water daily Antacid - Uses as needed Docusate - 1 daily -Medications previously tried: Mylanta  -Recommended to continue current medication  Patient Goals/Self-Care Activities Patient will:  - take medications as prescribed - start vitamin D3 supplement [phone] IU daily for bone health  Follow Up Plan: Telephone follow up appointment with care  management  team member scheduled for:  12 months -CMA call for BP and cholesterol review in 3 months      Patient verbalizes understanding of instructions provided today and agrees to view in [place].   Debbora Dus, PharmD Clinical Pharmacist [facility] at [facility] [phone]

## 2021-06-19 DIAGNOSIS — M0579 Rheumatoid arthritis with rheumatoid factor of multiple sites without organ or systems involvement: Secondary | ICD-10-CM | POA: Diagnosis not present

## 2021-06-19 DIAGNOSIS — M150 Primary generalized (osteo)arthritis: Secondary | ICD-10-CM | POA: Diagnosis not present

## 2021-07-02 ENCOUNTER — Other Ambulatory Visit: Payer: Self-pay | Admitting: Family Medicine

## 2021-09-23 ENCOUNTER — Other Ambulatory Visit: Payer: Self-pay | Admitting: Family Medicine

## 2021-09-24 ENCOUNTER — Telehealth: Payer: Self-pay | Admitting: Family Medicine

## 2021-09-24 NOTE — Telephone Encounter (Signed)
See Dr. Marliss Coots comments pt needs an in office appt to discuss this

## 2021-09-24 NOTE — Telephone Encounter (Signed)
I called pt and someone by the name of Lovena Le said she was not home and I asked can you have mrs. Kines to give [place] a call

## 2021-09-24 NOTE — Telephone Encounter (Signed)
I could see her in the office to determine that.  If she is homebound we can order home PT, otherwise it would be ambulatory-all depending on her evaluation (either way requires a face to face visit)

## 2021-09-24 NOTE — Telephone Encounter (Signed)
Pt called in would like to know how to go about getting physical Therapy at home .Please Advise #[phone]

## 2021-09-25 NOTE — Telephone Encounter (Signed)
Pt scheduled O/V on 12.13.22

## 2021-10-01 ENCOUNTER — Encounter: Payer: Self-pay | Admitting: Family Medicine

## 2021-10-01 ENCOUNTER — Ambulatory Visit (INDEPENDENT_AMBULATORY_CARE_PROVIDER_SITE_OTHER): Payer: Medicare Other | Admitting: Family Medicine

## 2021-10-01 ENCOUNTER — Other Ambulatory Visit: Payer: Self-pay

## 2021-10-01 VITALS — BP 132/76 | HR 51 | Temp 97.8°F | Ht 67.0 in | Wt 203.5 lb

## 2021-10-01 DIAGNOSIS — R2689 Other abnormalities of gait and mobility: Secondary | ICD-10-CM

## 2021-10-01 DIAGNOSIS — M069 Rheumatoid arthritis, unspecified: Secondary | ICD-10-CM | POA: Diagnosis not present

## 2021-10-01 DIAGNOSIS — R5382 Chronic fatigue, unspecified: Secondary | ICD-10-CM

## 2021-10-01 DIAGNOSIS — Z79899 Other long term (current) drug therapy: Secondary | ICD-10-CM

## 2021-10-01 DIAGNOSIS — Z7952 Long term (current) use of systemic steroids: Secondary | ICD-10-CM | POA: Diagnosis not present

## 2021-10-01 DIAGNOSIS — R531 Weakness: Secondary | ICD-10-CM | POA: Diagnosis not present

## 2021-10-01 DIAGNOSIS — R7303 Prediabetes: Secondary | ICD-10-CM | POA: Diagnosis not present

## 2021-10-01 LAB — CBC WITH DIFFERENTIAL/PLATELET
Basophils Absolute: 0 10*3/uL (ref 0.0–0.1)
Basophils Relative: 0.5 % (ref 0.0–3.0)
Eosinophils Absolute: 0.1 10*3/uL (ref 0.0–0.7)
Eosinophils Relative: 2 % (ref 0.0–5.0)
HCT: 38.9 % (ref 36.0–46.0)
Hemoglobin: 12.9 g/dL (ref 12.0–15.0)
Lymphocytes Relative: 25.3 % (ref 12.0–46.0)
Lymphs Abs: 1.3 10*3/uL (ref 0.7–4.0)
MCHC: 33.1 g/dL (ref 30.0–36.0)
MCV: 86.3 fl (ref 78.0–100.0)
Monocytes Absolute: 0.2 10*3/uL (ref 0.1–1.0)
Monocytes Relative: 4.1 % (ref 3.0–12.0)
Neutro Abs: 3.5 10*3/uL (ref 1.4–7.7)
Neutrophils Relative %: 68.1 % (ref 43.0–77.0)
Platelets: 257 10*3/uL (ref 150.0–400.0)
RBC: 4.51 Mil/uL (ref 3.87–5.11)
RDW: 16.4 % — ABNORMAL HIGH (ref 11.5–15.5)
WBC: 5.1 10*3/uL (ref 4.0–10.5)

## 2021-10-01 LAB — COMPREHENSIVE METABOLIC PANEL
ALT: 5 U/L (ref 0–35)
AST: 12 U/L (ref 0–37)
Albumin: 3.6 g/dL (ref 3.5–5.2)
Alkaline Phosphatase: 59 U/L (ref 39–117)
BUN: 8 mg/dL (ref 6–23)
CO2: 29 mEq/L (ref 19–32)
Calcium: 8.8 mg/dL (ref 8.4–10.5)
Chloride: 104 mEq/L (ref 96–112)
Creatinine, Ser: 0.82 mg/dL (ref 0.40–1.20)
GFR: 68.04 mL/min (ref >60.00)
Glucose, Bld: 87 mg/dL (ref 70–99)
Potassium: 3.8 mEq/L (ref 3.5–5.1)
Sodium: 141 mEq/L (ref 135–145)
Total Bilirubin: 0.5 mg/dL (ref 0.2–1.2)
Total Protein: 6.9 g/dL (ref 6.0–8.3)

## 2021-10-01 LAB — IRON: Iron: 94 ug/dL (ref 42–145)

## 2021-10-01 LAB — HEMOGLOBIN A1C: Hgb A1c MFr Bld: 6.6 % — ABNORMAL HIGH (ref 4.6–6.5)

## 2021-10-01 LAB — TSH: TSH: 2.36 u[IU]/mL (ref 0.35–5.50)

## 2021-10-01 LAB — T4, FREE: Free T4: 0.85 ng/dL (ref 0.60–1.60)

## 2021-10-01 LAB — VITAMIN B12: Vitamin B-12: 224 pg/mL (ref 211–911)

## 2021-10-01 NOTE — Assessment & Plan Note (Signed)
Deconditioned  Pain (RA) limits activity  Would like to get stronger  Needs help with exercise plan for her limitations (pain and also balance)  No doubt fitness would improve energy level and mood and also reduce fall risk

## 2021-10-01 NOTE — Assessment & Plan Note (Signed)
Multifactorial: age, medications, pain (RA) Referral done for PT for gait and strength training

## 2021-10-01 NOTE — Patient Instructions (Addendum)
I placed a referral to outpatient PT   (I think you would benefit for balance and strength)  You will get a call to set that up   Labs today   Take a look at the handout on fall prevention

## 2021-10-01 NOTE — Assessment & Plan Note (Signed)
Multifactorial Chronic RA pain /frustration no doubt adds to it  Also deconditioning   Labs ordered for fatigue

## 2021-10-01 NOTE — Assessment & Plan Note (Signed)
Added B12 to labs Some fatigue

## 2021-10-01 NOTE — Assessment & Plan Note (Signed)
A1c added to labs  Has RA

## 2021-10-01 NOTE — Progress Notes (Signed)
Subjective:    Patient ID: [name], female    DOB: Aug 02, 1941, 80 y.o.   MRN: [id number]  This visit occurred during the SARS-CoV-2 public health emergency.  Safety protocols were in place, including screening questions prior to the visit, additional usage of staff PPE, and extensive cleaning of exam room while observing appropriate contact time as indicated for disinfecting solutions.   HPI Pt presents to discuss fatigue and PT  Wt Readings from Last 3 Encounters:  10/01/21 203 lb 8 oz (92.3 kg)  05/09/21 194 lb (88 kg)  04/19/21 195 lb (88.5 kg)   31.87 kg/m  Does not eat more than 1 meal per day  Tired -moreso lately  Gets regular rheum labs-nothing is off  Not sleepy /just tired Does not sleep well overall (wakes up at 3 am)   Not depressed or anxious  Frustrated    Legs/knees wear out quit  Weaker than she used to be  Not as steady (due to pain) in her legs  Is interested in PT for balance and strength  Has to use a cane and walker at times  Not dizzy/ just off balance   Almost fell on escalator - missed a step/caught on railing  She walks outdoors and in the house for exercise  10 minute intervals    Still struggling with arthritis - lot of pain  Sees Dr Amil Amen  Still on prednisone   HTN  BP Readings from Last 3 Encounters:  10/01/21 132/76  05/09/21 130/80  02/04/21 140/72   Patient Active Problem List   Diagnosis Date Noted   Current use of proton pump inhibitor 10/01/2021   General weakness 10/01/2021   Poor balance 10/01/2021   On prednisone therapy 10/01/2021   Hyperlipidemia 10/15/2020   Pre-op exam 06/01/2020   Fatigue 01/30/2020   TMJ (dislocation of temporomandibular joint) 04/29/2019   Encounter for screening mammogram for breast cancer 10/27/2017   Routine general medical examination at a health care facility 10/27/2017   Generalized abdominal pain 03/31/2017   Aortic atherosclerosis ([place]) 02/03/2017   RA (rheumatoid  arthritis) ([place])    Prediabetes    Adjustment disorder with mixed anxiety and depressed mood 06/24/2016   Essential hypertension 01/07/2016   Coronary atherosclerosis 11/07/2015   Osteoarthritis 05/10/2014   GERD 07/09/2007   Past Medical History:  Diagnosis Date   Allergic rhinitis    Cataract 2015   bilateral   COVID-19 10/2019   ASYMPTOMATIC   Diverticulosis    Esophageal reflux    Essential hypertension 01/07/2016   Fatty liver 2018   Fibula fracture 02/2005   Former smoker    Gastritis 2003   Heart murmur    MILD, NO CARDIOLOGIST   Hemorrhoids    History of cardiac dysrhythmia 2011   ABLATION  AT [facility]   History of kidney stones YRS AGO   Hyperglycemia    Hyperlipemia    Osteoarthritis    Personal history of colonic polyps 1998   hyperplastic   PMB (postmenopausal bleeding)    Pre-diabetes    BORDERLINE DIET CONTROLLED DOES NOT CHECK CBG   RA (rheumatoid arthritis) ([place])    Rectal bleed 2018   TRANSFUSION GIVEN   Shingles 2012   Uterine fibroid    Past Surgical History:  Procedure Laterality Date   CHOLECYSTECTOMY  YRS AGO   COLONOSCOPY  11/08   internal hemorrhoids   DILATATION & CURETTAGE/HYSTEROSCOPY WITH MYOSURE N/A 07/06/2020   Procedure: DILATATION & CURETTAGE/HYSTEROSCOPY;  Surgeon: Delilah Shan,  Dellis Filbert, MD;  Location: [facility];  Service: Gynecology;  Laterality: N/A;  request 7:30am OR time in [place] block requests 30 minutes   EP procedure  1994   SVT; normal echo   ESOPHAGOGASTRODUODENOSCOPY  11/03   reactive gastropathy   EYE SURGERY Bilateral 2017   CATARACTS   LIPOMA EXCISION  11/2002   SVT s/p surgery--? ablation  2011   AT [place] AGO   vaginal polyp excised  12/2000   benign   Social History   Tobacco Use   Smoking status: Former    Packs/day: 0.75    Years: 15.00    Pack years: 11.25    Types: Cigarettes    Quit date: 10/21/2003    Years since quitting: 17.9   Smokeless tobacco: Never   Vaping Use   Vaping Use: Never used  Substance Use Topics   Alcohol use: No    Alcohol/week: 0.0 standard drinks   Drug use: No   Family History  Problem Relation Age of Onset   Colon cancer Father 57   Diabetes Mother    Heart disease Mother    Kidney disease Mother        ESRD   Stroke Mother    Hypertension Brother    Breast cancer Sister 62   Diabetes Other        nephews   Colon cancer Sister    Esophageal cancer Neg Hx    Stomach cancer Neg Hx    Rectal cancer Neg Hx    Allergies  Allergen Reactions   [place]ee Venom Anaphylaxis   Dicyclomine     Blurry vision   Humira [Adalimumab]     LIPS SWELLED AND RASH   Current Outpatient Medications on File Prior to Visit  Medication Sig Dispense Refill   acetaminophen (TYLENOL) 325 MG tablet Take 650 mg by mouth every 6 (six) hours as needed.     albuterol (PROVENTIL HFA;VENTOLIN HFA) 108 (90 Base) MCG/ACT inhaler Inhale 2 puffs into the lungs every 4 (four) hours as needed for wheezing or shortness of breath. Please give spacer if possible 1 Inhaler 3   amLODipine (NORVASC) 5 MG tablet TAKE 1 TABLET BY MOUTH EVERY DAY AT NOON 90 tablet 0   atorvastatin (LIPITOR) 10 MG tablet Take 0.5 tablets (5 mg total) by mouth every other day. 45 tablet 3   dicyclomine (BENTYL) 10 MG/5ML solution Take 1-2 mLs (2-4 mg total) by mouth 4 (four) times daily -  before meals and at bedtime. Prev with blurry vision on higher dose but not an allergy and may be able to tolerate lower dose. 100 mL 1   docusate sodium (COLACE) 100 MG capsule Take 1 capsule (100 mg total) by mouth 2 (two) times daily. 60 capsule 0   EPINEPHrine (EPIPEN IJ) Inject as directed as needed. Reported on 12/05/2015     fluticasone (FLONASE) 50 MCG/ACT nasal spray PLACE 2 SPRAYS INTO THE NOSE DAILY. (Patient taking differently: as needed.) 48 g 3   folic acid (FOLVITE) 1 MG tablet Take 1 mg by mouth daily.     loratadine (CLARITIN) 10 MG tablet Take 10 mg by mouth daily. MAY TAKE  AT HS ALSO     methotrexate (RHEUMATREX) 2.5 MG tablet Take 15 mg by mouth once a week. Caution:Chemotherapy. Protect from light. ( Monday of each week )     pantoprazole (PROTONIX) 40 MG tablet TAKE 1 TABLET BY MOUTH EVERY DAY AT  NOON 90 tablet 1   polyethylene glycol (MIRALAX / GLYCOLAX) packet Take 17 g by mouth daily.     predniSONE (DELTASONE) 5 MG tablet Take 5 mg by mouth daily.     No current facility-administered medications on file prior to visit.     Review of Systems  Constitutional:  Positive for fatigue. Negative for activity change, appetite change, fever and unexpected weight change.  HENT:  Negative for congestion, ear pain, rhinorrhea, sinus pressure and sore throat.   Eyes:  Negative for pain, redness and visual disturbance.  Respiratory:  Negative for cough, shortness of breath and wheezing.   Cardiovascular:  Negative for chest pain and palpitations.  Gastrointestinal:  Negative for abdominal pain, blood in stool, constipation and diarrhea.  Endocrine: Negative for polydipsia and polyuria.  Genitourinary:  Negative for dysuria, frequency and urgency.  Musculoskeletal:  Positive for arthralgias and back pain. Negative for myalgias.  Skin:  Negative for pallor and rash.  Allergic/Immunologic: Negative for environmental allergies.  Neurological:  Positive for weakness. Negative for dizziness, tremors, syncope and headaches.       Poor balance w/o dizziness  Hematological:  Negative for adenopathy. Does not bruise/bleed easily.  Psychiatric/Behavioral:  Negative for decreased concentration and dysphoric mood. The patient is not nervous/anxious.       Objective:   Physical Exam Constitutional:      General: She is not in acute distress.    Appearance: Normal appearance. She is obese. She is not ill-appearing.  Eyes:     General:        Right eye: No discharge.        Left eye: No discharge.     Conjunctiva/sclera: Conjunctivae normal.     Pupils: Pupils are equal,  round, and reactive to light.  Cardiovascular:     Rate and Rhythm: Regular rhythm. Bradycardia present.     Heart sounds: Normal heart sounds.  Pulmonary:     Effort: Pulmonary effort is normal. No respiratory distress.     Breath sounds: Normal breath sounds.  Musculoskeletal:     Cervical back: Neck supple. No tenderness.     Comments: Enlargement of 2nd MCP joints with ulnar deviation of fingers  Limited rom of hips and knees due to pain   Mild ankle swelling     Lymphadenopathy:     Cervical: No cervical adenopathy.  Skin:    General: Skin is warm and dry.  Neurological:     Mental Status: She is alert.     Cranial Nerves: No cranial nerve deficit.     Sensory: No sensory deficit.     Coordination: Coordination normal.     Gait: Gait abnormal.     Comments: Pt can rise from chair w/o arms (fairly painful but able to)  Gait is steady but slow /wide based with concentration  Uses a cane   Weakness is generalized/no focal   Psychiatric:        Mood and Affect: Mood normal.          Assessment & Plan:   Problem List Items Addressed This Visit       Musculoskeletal and Integument   RA (rheumatoid arthritis) ([place])   Relevant Orders   Ambulatory referral to Physical Therapy     Other   Prediabetes    Still taking prednisone for RA a1c ordered  disc imp of low glycemic diet and wt loss to prevent DM2       Relevant Orders   Hemoglobin  A1c   Fatigue    Multifactorial Chronic RA pain /frustration no doubt adds to it  Also deconditioning   Labs ordered for fatigue      Relevant Orders   CBC with Differential/Platelet   Comprehensive metabolic panel   TSH   Vitamin B12   Iron   T4, free   Current use of proton pump inhibitor    Added B12 to labs Some fatigue       Relevant Orders   Vitamin B12   General weakness - Primary    Deconditioned  Pain (RA) limits activity  Would like to get stronger  Needs help with exercise plan for her  limitations (pain and also balance)  No doubt fitness would improve energy level and mood and also reduce fall risk        Relevant Orders   Ambulatory referral to Physical Therapy   Poor balance    Multifactorial: age, medications, pain (RA) Referral done for PT for gait and strength training       Relevant Orders   Ambulatory referral to Physical Therapy   On prednisone therapy    A1c added to labs  Has RA

## 2021-10-01 NOTE — Assessment & Plan Note (Signed)
Still taking prednisone for RA a1c ordered  disc imp of low glycemic diet and wt loss to prevent DM2

## 2021-10-02 ENCOUNTER — Encounter: Payer: Self-pay | Admitting: *Deleted

## 2021-10-18 DIAGNOSIS — M5136 Other intervertebral disc degeneration, lumbar region: Secondary | ICD-10-CM | POA: Diagnosis not present

## 2021-10-18 DIAGNOSIS — M0579 Rheumatoid arthritis with rheumatoid factor of multiple sites without organ or systems involvement: Secondary | ICD-10-CM | POA: Diagnosis not present

## 2021-10-18 DIAGNOSIS — M150 Primary generalized (osteo)arthritis: Secondary | ICD-10-CM | POA: Diagnosis not present

## 2021-10-29 DIAGNOSIS — M25571 Pain in right ankle and joints of right foot: Secondary | ICD-10-CM | POA: Diagnosis not present

## 2021-10-29 DIAGNOSIS — M25562 Pain in left knee: Secondary | ICD-10-CM | POA: Diagnosis not present

## 2021-10-29 DIAGNOSIS — M25572 Pain in left ankle and joints of left foot: Secondary | ICD-10-CM | POA: Diagnosis not present

## 2021-10-29 DIAGNOSIS — M25561 Pain in right knee: Secondary | ICD-10-CM | POA: Diagnosis not present

## 2021-10-29 DIAGNOSIS — M25551 Pain in right hip: Secondary | ICD-10-CM | POA: Diagnosis not present

## 2021-10-29 DIAGNOSIS — M6281 Muscle weakness (generalized): Secondary | ICD-10-CM | POA: Diagnosis not present

## 2021-10-29 DIAGNOSIS — M25552 Pain in left hip: Secondary | ICD-10-CM | POA: Diagnosis not present

## 2021-11-01 DIAGNOSIS — M25551 Pain in right hip: Secondary | ICD-10-CM | POA: Diagnosis not present

## 2021-11-01 DIAGNOSIS — M25561 Pain in right knee: Secondary | ICD-10-CM | POA: Diagnosis not present

## 2021-11-01 DIAGNOSIS — M25571 Pain in right ankle and joints of right foot: Secondary | ICD-10-CM | POA: Diagnosis not present

## 2021-11-01 DIAGNOSIS — M25552 Pain in left hip: Secondary | ICD-10-CM | POA: Diagnosis not present

## 2021-11-01 DIAGNOSIS — M6281 Muscle weakness (generalized): Secondary | ICD-10-CM | POA: Diagnosis not present

## 2021-11-01 DIAGNOSIS — M25572 Pain in left ankle and joints of left foot: Secondary | ICD-10-CM | POA: Diagnosis not present

## 2021-11-01 DIAGNOSIS — M25562 Pain in left knee: Secondary | ICD-10-CM | POA: Diagnosis not present

## 2021-11-08 DIAGNOSIS — M25552 Pain in left hip: Secondary | ICD-10-CM | POA: Diagnosis not present

## 2021-11-08 DIAGNOSIS — M25571 Pain in right ankle and joints of right foot: Secondary | ICD-10-CM | POA: Diagnosis not present

## 2021-11-08 DIAGNOSIS — M25572 Pain in left ankle and joints of left foot: Secondary | ICD-10-CM | POA: Diagnosis not present

## 2021-11-08 DIAGNOSIS — M25562 Pain in left knee: Secondary | ICD-10-CM | POA: Diagnosis not present

## 2021-11-08 DIAGNOSIS — M25551 Pain in right hip: Secondary | ICD-10-CM | POA: Diagnosis not present

## 2021-11-08 DIAGNOSIS — M25561 Pain in right knee: Secondary | ICD-10-CM | POA: Diagnosis not present

## 2021-11-08 DIAGNOSIS — M6281 Muscle weakness (generalized): Secondary | ICD-10-CM | POA: Diagnosis not present

## 2021-11-08 NOTE — Progress Notes (Signed)
Subjective:   [name] is a 81 y.o. female who presents for Medicare Annual (Subsequent) preventive examination.  I connected with Kitty Cadavid today by telephone and verified that I am speaking with the correct person using two identifiers. Location patient: home Location provider: work Persons participating in the virtual visit: patient, Marine scientist.    I discussed the limitations, risks, security and privacy concerns of performing an evaluation and management service by telephone and the availability of in person appointments. I also discussed with the patient that there may be a patient responsible charge related to this service. The patient expressed understanding and verbally consented to this telephonic visit.    Interactive audio and video telecommunications were attempted between this provider and patient, however failed, due to patient having technical difficulties OR patient did not have access to video capability.  We continued and completed visit with audio only.  Some vital signs may be absent or patient reported.   Time Spent with patient on telephone encounter: 25 minutes  Review of Systems     Cardiac Risk Factors include: advanced age (>31men, >25 women);hypertension;dyslipidemia     Objective:    Today's Vitals   11/11/21 1444  Weight: 203 lb (92.1 kg)  Height: 5\' 7"  (1.702 m)   Body mass index is 31.79 kg/m.  Advanced Directives 11/11/2021 11/09/2020 07/06/2020 01/30/2020 10/23/2017 01/21/2017 01/21/2017  Does Patient Have a Medical Advance Directive? No No No Yes No No No  Type of Advance Directive - - Public librarian;Living will - - -  Copy of [place] in Chart? - - - No - copy requested - - -  Would patient like information on creating a medical advance directive? Yes (MAU/Ambulatory/Procedural Areas - Information given) No - Patient declined No - Patient declined - Yes (MAU/Ambulatory/Procedural Areas - Information given) No  - Patient declined -    Current Medications (verified) Outpatient Encounter Medications as of 11/11/2021  Medication Sig   acetaminophen (TYLENOL) 325 MG tablet Take 650 mg by mouth every 6 (six) hours as needed.   albuterol (PROVENTIL HFA;VENTOLIN HFA) 108 (90 Base) MCG/ACT inhaler Inhale 2 puffs into the lungs every 4 (four) hours as needed for wheezing or shortness of breath. Please give spacer if possible   amLODipine (NORVASC) 5 MG tablet TAKE 1 TABLET BY MOUTH EVERY DAY AT NOON   atorvastatin (LIPITOR) 10 MG tablet Take 0.5 tablets (5 mg total) by mouth every other day.   dicyclomine (BENTYL) 10 MG/5ML solution Take 1-2 mLs (2-4 mg total) by mouth 4 (four) times daily -  before meals and at bedtime. Prev with blurry vision on higher dose but not an allergy and may be able to tolerate lower dose.   docusate sodium (COLACE) 100 MG capsule Take 1 capsule (100 mg total) by mouth 2 (two) times daily.   EPINEPHrine (EPIPEN IJ) Inject as directed as needed. Reported on 12/05/2015   fluticasone (FLONASE) 50 MCG/ACT nasal spray PLACE 2 SPRAYS INTO THE NOSE DAILY. (Patient taking differently: as needed.)   folic acid (FOLVITE) 1 MG tablet Take 1 mg by mouth daily.   loratadine (CLARITIN) 10 MG tablet Take 10 mg by mouth daily. MAY TAKE AT HS ALSO   methotrexate (RHEUMATREX) 2.5 MG tablet Take 15 mg by mouth once a week. Caution:Chemotherapy. Protect from light. ( Monday of each week )   pantoprazole (PROTONIX) 40 MG tablet TAKE 1 TABLET BY MOUTH EVERY DAY AT NOON   polyethylene glycol (  MIRALAX / GLYCOLAX) packet Take 17 g by mouth daily.   predniSONE (DELTASONE) 5 MG tablet Take 5 mg by mouth daily.   No facility-administered encounter medications on file as of 11/11/2021.    Allergies (verified) Bee venom, Dicyclomine, and Humira [adalimumab]   History: Past Medical History:  Diagnosis Date   Allergic rhinitis    Cataract 2015   bilateral   COVID-19 10/2019   ASYMPTOMATIC    Diverticulosis    Esophageal reflux    Essential hypertension 01/07/2016   Fatty liver 2018   Fibula fracture 02/2005   Former smoker    Gastritis 2003   Heart murmur    MILD, NO CARDIOLOGIST   Hemorrhoids    History of cardiac dysrhythmia 2011   ABLATION  AT [facility]   History of kidney stones YRS AGO   Hyperglycemia    Hyperlipemia    Osteoarthritis    Personal history of colonic polyps 1998   hyperplastic   PMB (postmenopausal bleeding)    Pre-diabetes    BORDERLINE DIET CONTROLLED DOES NOT CHECK CBG   RA (rheumatoid arthritis) ([place])    Rectal bleed 2018   TRANSFUSION GIVEN   Shingles 2012   Uterine fibroid    Past Surgical History:  Procedure Laterality Date   CHOLECYSTECTOMY  YRS AGO   COLONOSCOPY  11/08   internal hemorrhoids   DILATATION & CURETTAGE/HYSTEROSCOPY WITH MYOSURE N/A 07/06/2020   Procedure: DILATATION & CURETTAGE/HYSTEROSCOPY;  Surgeon: Joseph Pierini, MD;  Location: [facility];  Service: Gynecology;  Laterality: N/A;  request 7:30am OR time in [place] block requests 30 minutes   EP procedure  1994   SVT; normal echo   ESOPHAGOGASTRODUODENOSCOPY  11/03   reactive gastropathy   EYE SURGERY Bilateral 2017   CATARACTS   LIPOMA EXCISION  11/2002   SVT s/p surgery--? ablation  2011   AT [facility] AGO   vaginal polyp excised  12/2000   benign   Family History  Problem Relation Age of Onset   Colon cancer Father 26   Diabetes Mother    Heart disease Mother    Kidney disease Mother        ESRD   Stroke Mother    Hypertension Brother    Breast cancer Sister 3   Diabetes Other        nephews   Colon cancer Sister    Esophageal cancer Neg Hx    Stomach cancer Neg Hx    Rectal cancer Neg Hx    Social History   Socioeconomic History   Marital status: Married    Spouse name: Not on file   Number of children: 3   Years of education: Not on file   Highest education level: Not on file  Occupational  History   Occupation: retired    Fish farm manager: RETIRED  Tobacco Use   Smoking status: Former    Packs/day: 0.75    Years: 15.00    Pack years: 11.25    Types: Cigarettes    Quit date: 10/21/2003    Years since quitting: 18.0   Smokeless tobacco: Never  Vaping Use   Vaping Use: Never used  Substance and Sexual Activity   Alcohol use: No    Alcohol/week: 0.0 standard drinks   Drug use: No   Sexual activity: Yes    Comment: 1st intercourse 81 yo-Fewer than 5 partners  Other Topics Concern   Not on file  Social History Narrative  Married      Retired      Occasional caffeine      No regular exercise   Social Determinants of Radio broadcast assistant Strain: Low Risk    Difficulty of Paying Living Expenses: Not hard at all  Food Insecurity: No Food Insecurity   Worried About Charity fundraiser in the Last Year: Never true   Arboriculturist in the Last Year: Never true  Transportation Needs: No Transportation Needs   Lack of Transportation (Medical): No   Lack of Transportation (Non-Medical): No  Physical Activity: Insufficiently Active   Days of Exercise per Week: 2 days   Minutes of Exercise per Session: 40 min  Stress: No Stress Concern Present   Feeling of Stress : Only a little  Social Connections: Moderately Isolated   Frequency of Communication with Friends and Family: More than three times a week   Frequency of Social Gatherings with Friends and Family: More than three times a week   Attends Religious Services: Never   Marine scientist or Organizations: No   Attends Music therapist: Never   Marital Status: Married    Tobacco Counseling Counseling given: Not Answered   Clinical Intake:  Pre-visit preparation completed: Yes  Pain : No/denies pain     BMI - recorded: 31.79 Nutritional Status: BMI > 30  Obese Nutritional Risks: None Diabetes: No  How often do you need to have someone help you when you read instructions,  pamphlets, or other written materials from your doctor or pharmacy?: 1 - Never  Diabetic? No  Interpreter Needed?: No  Information entered by :: Orrin Brigham LPN   Activities of Daily Living In your present state of health, do you have any difficulty performing the following activities: 11/11/2021  Hearing? N  Vision? N  Difficulty concentrating or making decisions? N  Walking or climbing stairs? Y  Dressing or bathing? N  Doing errands, shopping? Y  Comment husband Restaurant manager, fast food and eating ? N  Using the Toilet? N  In the past six months, have you accidently leaked urine? N  Do you have problems with loss of bowel control? N  Managing your Medications? N  Managing your Finances? N  Housekeeping or managing your Housekeeping? N  Some recent data might be hidden    Patient Care Team: Tower, Wynelle Fanny, MD as PCP - General Fay Records, MD as Consulting Physician (Cardiology) Ladene Artist, MD as Consulting Physician (Gastroenterology) Lorelee Cover., MD as Consulting Physician (Ophthalmology) Debbora Dus, [facility] as Pharmacist (Pharmacist)  Indicate any recent Medical Services you may have received from other than [facility] providers in the past year (date may be approximate).     Assessment:   This is a routine wellness examination for Avi.  Hearing/Vision screen Hearing Screening - Comments:: No issues Vision Screening - Comments:: Last exam over a year ago, Dr. Gloriann Loan  Dietary issues and exercise activities discussed: Current Exercise Habits: Home exercise routine, Type of exercise: walking;stretching;strength training/weights;Other - see comments (does PT 2 days a week for 30 min and walks 2 days for 10 minutes), Time (Minutes): 40, Frequency (Times/Week): 2, Weekly Exercise (Minutes/Week): 80, Intensity: Mild   Goals Addressed             This Visit's Progress    Patient Stated       Would like to maintain current walking routine        Depression Screen  PHQ 2/9 Scores 11/11/2021 11/09/2020 01/30/2020 01/30/2020 10/29/2018 10/23/2017 03/31/2017  PHQ - 2 Score 0 0 0 1 0 0 2  PHQ- 9 Score - 0 0 - - 2 10    Fall Risk Fall Risk  11/11/2021 11/09/2020 01/30/2020 10/29/2018 10/23/2017  Falls in the past year? 0 0 0 0 No  Number falls in past yr: 0 0 0 0 -  Injury with Fall? 0 0 0 - -  Risk for fall due to : - Medication side effect Medication side effect - -  Follow up Falls prevention discussed Falls evaluation completed;Falls prevention discussed Falls evaluation completed;Falls prevention discussed - -    FALL RISK PREVENTION PERTAINING TO THE HOME:  Any stairs in or around the home? No  If so, are there any without handrails? No  Home free of loose throw rugs in walkways, pet beds, electrical cords, etc? Yes  Adequate lighting in your home to reduce risk of falls? Yes   ASSISTIVE DEVICES UTILIZED TO PREVENT FALLS:  Life alert? No  Use of a cane, walker or w/c? Yes , can and walker as needed Grab bars in the bathroom? No  Shower chair or bench in shower? No  Elevated toilet seat or a handicapped toilet? No   TIMED UP AND GO:  Was the test performed? No .    Cognitive Function: Normal cognitive status assessed by  this Nurse Health Advisor. No abnormalities found.   MMSE - Mini Mental State Exam 11/09/2020 01/30/2020 10/23/2017 01/03/2016  Orientation to time 5 5 5 5   Orientation to Place 5 5 5 5   Registration 3 3 3 3   Attention/ Calculation 5 5 0 5  Recall 3 3 3 3   Language- name 2 objects - - 0 0  Language- repeat 1 1 1 1   Language- follow 3 step command - - 3 3  Language- read & follow direction - - 0 1  Write a sentence - - 0 0  Copy design - - 0 0  Total score - - 20 26        Immunizations Immunization History  Administered Date(s) Administered   Fluad Quad(high Dose 65+) 08/14/2020   Influenza,inj,Quad PF,6+ Mos 08/10/2015, 08/28/2016, 10/27/2017, 07/27/2018   PFIZER(Purple Top)SARS-COV-2 Vaccination  12/18/2019, 01/17/2020, 10/18/2020, 04/29/2021   Pneumococcal Conjugate-13 01/03/2016   Pneumococcal Polysaccharide-23 04/24/2014   Td 09/04/2006    TDAP status: Due, Education has been provided regarding the importance of this vaccine. Advised may receive this vaccine at local pharmacy or Health Dept. Aware to provide a copy of the vaccination record if obtained from local pharmacy or Health Dept. Verbalized acceptance and understanding.  Flu Vaccine status: Up to date  Pneumococcal vaccine status: Up to date  Covid-19 vaccine status: Information provided on how to obtain vaccines.   Qualifies for Shingles Vaccine? Yes   Zostavax completed No   Shingrix Completed?: No.    Education has been provided regarding the importance of this vaccine. Patient has been advised to call insurance company to determine out of pocket expense if they have not yet received this vaccine. Advised may also receive vaccine at local pharmacy or Health Dept. Verbalized acceptance and understanding.  Screening Tests Health Maintenance  Topic Date Due   Hepatitis C Screening  Never done   Zoster Vaccines- Shingrix (1 of 2) Never done   MAMMOGRAM  11/18/2018   URINE MICROALBUMIN  10/30/2019   OPHTHALMOLOGY EXAM  11/20/2020   INFLUENZA VACCINE  05/20/2021   COVID-19 Vaccine (  5 - Booster for [employer] series) 06/24/2021   TETANUS/TDAP  05/07/2029 (Originally 09/04/2016)   HEMOGLOBIN A1C  04/01/2022   Pneumonia Vaccine 36+ Years old  Completed   DEXA SCAN  Completed   HPV VACCINES  Aged Out   FOOT EXAM  Discontinued    Health Maintenance  Health Maintenance Due  Topic Date Due   Hepatitis C Screening  Never done   Zoster Vaccines- Shingrix (1 of 2) Never done   MAMMOGRAM  11/18/2018   URINE MICROALBUMIN  10/30/2019   OPHTHALMOLOGY EXAM  11/20/2020   INFLUENZA VACCINE  05/20/2021   COVID-19 Vaccine (5 - Booster for [place] series) 06/24/2021    Colorectal cancer screening: No longer required.    Mammogram status: Ordered 11/11/21. Pt provided with contact info and advised to call to schedule appt.   Bone Density status: Ordered 11/11/21. Pt provided with contact info and advised to call to schedule appt.  Lung Cancer Screening: (Low Dose CT Chest recommended if Age 8-80 years, 30 pack-year currently smoking OR have quit w/in 15years.) does not qualify.     Additional Screening:  Hepatitis C Screening: does not qualify  Vision Screening: Recommended annual ophthalmology exams for early detection of glaucoma and other disorders of the eye. Is the patient up to date with their annual eye exam?  No  Who is the provider or what is the name of the office in which the patient attends annual eye exams? Dr. Gloriann Loan   Dental Screening: Recommended annual dental exams for proper oral hygiene  Community Resource Referral / Chronic Care Management: CRR required this visit?  No   CCM required this visit?  No      Plan:     I have personally reviewed and noted the following in the patients chart:   Medical and social history Use of alcohol, tobacco or illicit drugs  Current medications and supplements including opioid prescriptions.  Functional ability and status Nutritional status Physical activity Advanced directives List of other physicians Hospitalizations, surgeries, and ER visits in previous 12 months Vitals Screenings to include cognitive, depression, and falls Referrals and appointments  In addition, I have reviewed and discussed with patient certain preventive protocols, quality metrics, and best practice recommendations. A written personalized care plan for preventive services as well as general preventive health recommendations were provided to patient.   Due to this being a telephonic visit, the after visit summary with patients personalized plan was offered to patient via mail or my-chart.  Patient preferred to pick up at office at next visit.   Loma Messing, LPN   07/12/3006   Nurse Health Advisor  Nurse Notes: none

## 2021-11-11 ENCOUNTER — Ambulatory Visit (INDEPENDENT_AMBULATORY_CARE_PROVIDER_SITE_OTHER): Payer: Medicare Other

## 2021-11-11 VITALS — Ht 67.0 in | Wt 203.0 lb

## 2021-11-11 DIAGNOSIS — Z780 Asymptomatic menopausal state: Secondary | ICD-10-CM

## 2021-11-11 DIAGNOSIS — Z0000 Encounter for general adult medical examination without abnormal findings: Secondary | ICD-10-CM

## 2021-11-11 DIAGNOSIS — Z1231 Encounter for screening mammogram for malignant neoplasm of breast: Secondary | ICD-10-CM

## 2021-11-11 NOTE — Patient Instructions (Signed)
[name] , Thank you for taking time to complete your Medicare Wellness Visit. I appreciate your ongoing commitment to your health goals. Please review the following plan we discussed and let me know if I can assist you in the future.   Screening recommendations/referrals: Colonoscopy: no longer required Mammogram: due, last completed 11/18/17, ordered today someone will call to schedule an appointment Bone Density: due, last completed 10/16/16, ordered today, someone will call to schedule an appointment Recommended yearly ophthalmology/optometry visit for glaucoma screening and checkup Recommended yearly dental visit for hygiene and checkup  Vaccinations: Influenza vaccine: up to date, please provide vaccine information for your chart Pneumococcal vaccine: up to date Tdap vaccine: Due-May obtain vaccine at your local pharmacy. Shingles vaccine: Discuss with pharmacy   Covid-19:newest booster available at your local pharmacy  Advanced directives: information available et your next visit  Conditions/risks identified: see problem list  Next appointment: Follow up in one year for your annual wellness visit 11/12/22 @ 2:45pm, this will be a telephone visit.    Preventive Care 36 Years and Older, Female Preventive care refers to lifestyle choices and visits with your health care provider that can promote health and wellness. What does preventive care include? A yearly physical exam. This is also called an annual well check. Dental exams once or twice a year. Routine eye exams. Ask your health care provider how often you should have your eyes checked. Personal lifestyle choices, including: Daily care of your teeth and gums. Regular physical activity. Eating a healthy diet. Avoiding tobacco and drug use. Limiting alcohol use. Practicing safe sex. Taking low-dose aspirin every day. Taking vitamin and mineral supplements as recommended by your health care provider. What happens during an  annual well check? The services and screenings done by your health care provider during your annual well check will depend on your age, overall health, lifestyle risk factors, and family history of disease. Counseling  Your health care provider may ask you questions about your: Alcohol use. Tobacco use. Drug use. Emotional well-being. Home and relationship well-being. Sexual activity. Eating habits. History of falls. Memory and ability to understand (cognition). Work and work Statistician. Reproductive health. Screening  You may have the following tests or measurements: Height, weight, and BMI. Blood pressure. Lipid and cholesterol levels. These may be checked every 5 years, or more frequently if you are over 32 years old. Skin check. Lung cancer screening. You may have this screening every year starting at age 14 if you have a 30-pack-year history of smoking and currently smoke or have quit within the past 15 years. Fecal occult blood test (FOBT) of the stool. You may have this test every year starting at age 86. Flexible sigmoidoscopy or colonoscopy. You may have a sigmoidoscopy every 5 years or a colonoscopy every 10 years starting at age 15. Hepatitis C blood test. Hepatitis B blood test. Sexually transmitted disease (STD) testing. Diabetes screening. This is done by checking your blood sugar (glucose) after you have not eaten for a while (fasting). You may have this done every 1-3 years. Bone density scan. This is done to screen for osteoporosis. You may have this done starting at age 30. Mammogram. This may be done every 1-2 years. Talk to your health care provider about how often you should have regular mammograms. Talk with your health care provider about your test results, treatment options, and if necessary, the need for more tests. Vaccines  Your health care provider may recommend certain vaccines, such as: Influenza vaccine.  This is recommended every year. Tetanus,  diphtheria, and acellular pertussis (Tdap, Td) vaccine. You may need a Td booster every 10 years. Zoster vaccine. You may need this after age 106. Pneumococcal 13-valent conjugate (PCV13) vaccine. One dose is recommended after age 50. Pneumococcal polysaccharide (PPSV23) vaccine. One dose is recommended after age 50. Talk to your health care provider about which screenings and vaccines you need and how often you need them. This information is not intended to replace advice given to you by your health care provider. Make sure you discuss any questions you have with your health care provider. Document Released: 11/02/2015 Document Revised: 06/25/2016 Document Reviewed: 08/07/2015 Elsevier Interactive Patient Education  2017 [place] Prevention in the Home Falls can cause injuries. They can happen to people of all ages. There are many things you can do to make your home safe and to help prevent falls. What can I do on the outside of my home? Regularly fix the edges of walkways and driveways and fix any cracks. Remove anything that might make you trip as you walk through a door, such as a raised step or threshold. Trim any bushes or trees on the path to your home. Use bright outdoor lighting. Clear any walking paths of anything that might make someone trip, such as rocks or tools. Regularly check to see if handrails are loose or broken. Make sure that both sides of any steps have handrails. Any raised decks and porches should have guardrails on the edges. Have any leaves, snow, or ice cleared regularly. Use sand or salt on walking paths during winter. Clean up any spills in your garage right away. This includes oil or grease spills. What can I do in the bathroom? Use night lights. Install grab bars by the toilet and in the tub and shower. Do not use towel bars as grab bars. Use non-skid mats or decals in the tub or shower. If you need to sit down in the shower, use a plastic,  non-slip stool. Keep the floor dry. Clean up any water that spills on the floor as soon as it happens. Remove soap buildup in the tub or shower regularly. Attach bath mats securely with double-sided non-slip rug tape. Do not have throw rugs and other things on the floor that can make you trip. What can I do in the bedroom? Use night lights. Make sure that you have a light by your bed that is easy to reach. Do not use any sheets or blankets that are too big for your bed. They should not hang down onto the floor. Have a firm chair that has side arms. You can use this for support while you get dressed. Do not have throw rugs and other things on the floor that can make you trip. What can I do in the kitchen? Clean up any spills right away. Avoid walking on wet floors. Keep items that you use a lot in easy-to-reach places. If you need to reach something above you, use a strong step stool that has a grab bar. Keep electrical cords out of the way. Do not use floor polish or wax that makes floors slippery. If you must use wax, use non-skid floor wax. Do not have throw rugs and other things on the floor that can make you trip. What can I do with my stairs? Do not leave any items on the stairs. Make sure that there are handrails on both sides of the stairs and use them. Fix handrails  that are broken or loose. Make sure that handrails are as long as the stairways. Check any carpeting to make sure that it is firmly attached to the stairs. Fix any carpet that is loose or worn. Avoid having throw rugs at the top or bottom of the stairs. If you do have throw rugs, attach them to the floor with carpet tape. Make sure that you have a light switch at the top of the stairs and the bottom of the stairs. If you do not have them, ask someone to add them for you. What else can I do to help prevent falls? Wear shoes that: Do not have high heels. Have rubber bottoms. Are comfortable and fit you well. Are closed  at the toe. Do not wear sandals. If you use a stepladder: Make sure that it is fully opened. Do not climb a closed stepladder. Make sure that both sides of the stepladder are locked into place. Ask someone to hold it for you, if possible. Clearly mark and make sure that you can see: Any grab bars or handrails. First and last steps. Where the edge of each step is. Use tools that help you move around (mobility aids) if they are needed. These include: Canes. Walkers. Scooters. Crutches. Turn on the lights when you go into a dark area. Replace any light bulbs as soon as they burn out. Set up your furniture so you have a clear path. Avoid moving your furniture around. If any of your floors are uneven, fix them. If there are any pets around you, be aware of where they are. Review your medicines with your doctor. Some medicines can make you feel dizzy. This can increase your chance of falling. Ask your doctor what other things that you can do to help prevent falls. This information is not intended to replace advice given to you by your health care provider. Make sure you discuss any questions you have with your health care provider. Document Released: 08/02/2009 Document Revised: 03/13/2016 Document Reviewed: 11/10/2014 Elsevier Interactive Patient Education  2017 [employer].

## 2021-11-12 DIAGNOSIS — M25552 Pain in left hip: Secondary | ICD-10-CM | POA: Diagnosis not present

## 2021-11-12 DIAGNOSIS — M25572 Pain in left ankle and joints of left foot: Secondary | ICD-10-CM | POA: Diagnosis not present

## 2021-11-12 DIAGNOSIS — M25562 Pain in left knee: Secondary | ICD-10-CM | POA: Diagnosis not present

## 2021-11-12 DIAGNOSIS — M6281 Muscle weakness (generalized): Secondary | ICD-10-CM | POA: Diagnosis not present

## 2021-11-12 DIAGNOSIS — M25561 Pain in right knee: Secondary | ICD-10-CM | POA: Diagnosis not present

## 2021-11-12 DIAGNOSIS — M25571 Pain in right ankle and joints of right foot: Secondary | ICD-10-CM | POA: Diagnosis not present

## 2021-11-12 DIAGNOSIS — M25551 Pain in right hip: Secondary | ICD-10-CM | POA: Diagnosis not present

## 2021-11-14 ENCOUNTER — Other Ambulatory Visit: Payer: Self-pay | Admitting: Family Medicine

## 2021-11-15 DIAGNOSIS — M25571 Pain in right ankle and joints of right foot: Secondary | ICD-10-CM | POA: Diagnosis not present

## 2021-11-15 DIAGNOSIS — M25552 Pain in left hip: Secondary | ICD-10-CM | POA: Diagnosis not present

## 2021-11-15 DIAGNOSIS — M25561 Pain in right knee: Secondary | ICD-10-CM | POA: Diagnosis not present

## 2021-11-15 DIAGNOSIS — M6281 Muscle weakness (generalized): Secondary | ICD-10-CM | POA: Diagnosis not present

## 2021-11-15 DIAGNOSIS — M25572 Pain in left ankle and joints of left foot: Secondary | ICD-10-CM | POA: Diagnosis not present

## 2021-11-15 DIAGNOSIS — M25551 Pain in right hip: Secondary | ICD-10-CM | POA: Diagnosis not present

## 2021-11-15 DIAGNOSIS — M25562 Pain in left knee: Secondary | ICD-10-CM | POA: Diagnosis not present

## 2021-11-18 ENCOUNTER — Telehealth: Payer: Self-pay

## 2021-11-18 NOTE — Telephone Encounter (Signed)
[facility] - Client TELEPHONE ADVICE RECORD [facility] Patient Name: [name] Gender: Female DOB: 11/24/1940 Age: 81 Y 44 M 14 D Return Phone Number: [phone] (Primary), [phone] (Secondary) Address[place]  [address] Client [facility] - Client Client Site [facility] Provider Glori Bickers, Roque Lias - MD Contact Type Call Who Is Calling Patient / Member / Family / Caregiver Call Type Triage / Clinical Relationship To Patient Self Return Phone Number [phone] (Primary) Chief Complaint SEVERE ABDOMINAL PAIN - Severe pain in abdomen Reason for Call Symptomatic / Request for [place] states she is experiencing abdominal pain for the last 3 days. Worse than her usual pain, she couldn't sleep last night. Translation No Nurse Assessment Nurse: Ronnald Ramp, RN, Miranda Date/Time (Eastern Time): 11/18/2021 2:11:21 PM Confirm and document reason for call. If symptomatic, describe symptoms. ---Caller states she has been having abdominal for the last 3 days. She denies vomiting and diarrhea. Does the patient have any new or worsening symptoms? ---Yes Will a triage be completed? ---Yes Related visit to physician within the last 2 weeks? ---No Does the PT have any chronic conditions? (i.e. diabetes, asthma, this includes High risk factors for pregnancy, etc.) ---Yes List chronic conditions. ---GERD, Arthritis, HTN, Constipation Is this a behavioral health or substance abuse call? ---No Guidelines Guideline Title Affirmed Question Affirmed Notes Nurse Date/Time (Eastern Time) Abdominal Pain - Female [1] MILDMODERATE pain AND [2] constant AND [3] present > 2 hours Ronnald Ramp, RN, Miranda 11/18/2021 2:13:42 PM Disp. Time Eilene Ghazi Time) Disposition Final User 11/18/2021 2:09:43 PM Send to Urgent Queue [facility] PLEASE NOTE: All  timestamps contained within this report are represented as [place] Standard Time. CONFIDENTIALTY NOTICE: This fax transmission is intended only for the addressee. It contains information that is legally privileged, confidential or otherwise protected from use or disclosure. If you are not the intended recipient, you are strictly prohibited from reviewing, disclosing, copying using or disseminating any of this information or taking any action in reliance on or regarding this information. If you have received this fax in error, please notify us immediately by telephone so that we can arrange for its return to [place]. Phone: [phone], Toll-Free: [phone], Fax: [phone] Page: 2 of 2 Call Id: [id number] 11/18/2021 2:28:28 PM See HCP within 4 Hours (or PCP triage) Yes Ronnald Ramp, RN, Miranda Caller Disagree/Comply Disagree Caller Understands Yes PreDisposition Call Doctor Care Advice Given Per Guideline SEE HCP (OR PCP TRIAGE) WITHIN 4 HOURS: * IF OFFICE WILL BE OPEN: You need to be seen within the next 3 or 4 hours. Call your doctor (or NP/PA) now or as soon as the office opens. CARE ADVICE given per Abdominal Pain, Female (Adult) guideline. * You become worse CALL BACK IF: Comments User: Leverne Humbles, RN Date/Time Eilene Ghazi Time): 11/18/2021 2:32:58 PM Appt scheduled for tomorrow at 2:30 Referrals REFERRED TO PCP OFFIC

## 2021-11-18 NOTE — Telephone Encounter (Signed)
I spoke with pt; pt said 3 days ago started with sharp constant pain in upper and lower abd in the middle.  Pt has lower back pain that started on left side and pain then went to rt side. No N&V& diarrhea. Pt has not had fever, no chills and no body aches. Pt has slight H/A today; pain level now is 3. Dry cough that started on and off for 1 year. No SOB and no CP. Sat and Sun did not rest well due to abd pain.  Pt takes Claritin everyday. Pt has not had any burning or pain or frequency of urine.Pt said everytime she eats greasy foods she has the mid abd pain. Pt said right now her abd pain level is an 8 but pt refuses to go to UC or ED for eval. UC & ED precautions given and pt voiced understanding. Pt said she will keep appt that she already has with Dr Glori Bickers on 11/19/21 at 2:30 PM at [place]. Sending note to Dr Glori Bickers and [place] CMA and will teams [place].

## 2021-11-18 NOTE — Telephone Encounter (Signed)
Would have advised ER but evidently she refuses.   Agree with ER precautions and will see her then

## 2021-11-19 ENCOUNTER — Encounter: Payer: Self-pay | Admitting: Family Medicine

## 2021-11-19 ENCOUNTER — Ambulatory Visit (INDEPENDENT_AMBULATORY_CARE_PROVIDER_SITE_OTHER): Payer: Medicare Other | Admitting: Family Medicine

## 2021-11-19 ENCOUNTER — Other Ambulatory Visit: Payer: Self-pay

## 2021-11-19 VITALS — BP 136/78 | HR 79 | Temp 96.9°F | Ht 67.0 in | Wt 196.2 lb

## 2021-11-19 DIAGNOSIS — R1084 Generalized abdominal pain: Secondary | ICD-10-CM | POA: Diagnosis not present

## 2021-11-19 DIAGNOSIS — K219 Gastro-esophageal reflux disease without esophagitis: Secondary | ICD-10-CM | POA: Diagnosis not present

## 2021-11-19 MED ORDER — DICYCLOMINE HCL 10 MG/5ML PO SOLN: 2.0000 mg | Freq: Three times a day (TID) | ORAL | 3 refills | Status: DC

## 2021-11-19 NOTE — Progress Notes (Signed)
Subjective:    Patient ID: [name], female    DOB: [birth year], 81 y.o.   MRN: [id number]  This visit occurred during the SARS-CoV-2 public health emergency.  Safety protocols were in place, including screening questions prior to the visit, additional usage of staff PPE, and extensive cleaning of exam room while observing appropriate contact time as indicated for disinfecting solutions.   HPI Pt presents for c/o abdominal pain   Wt Readings from Last 3 Encounters:  11/19/21 196 lb 4 oz (89 kg)  11/11/21 203 lb (92.1 kg)  10/01/21 203 lb 8 oz (92.3 kg)   30.74 kg/m   She has a long h/o abd pain with large GI work up  Was better for 3-4 months then symptoms returned but worse   Started a week ago  Hurts around umbilicus  Worse to eat  Few moments of nausea (no vomiting)  No constipation or diarrhea-takes miralax No blood   Pain is sharp in nature At times severe  Not burning  No heartburn or acid in her throat   No fever  Had a mild headache one day-that is better (sinus trouble)   Took dicyclomine until it ran out  It helped in the past   Stress level in general is high Her arthritis (RA) is really stressing her out  Taking prednisone 5 mg daily  Methotrexate     Most recent GI care at [facility] takes protonix for GERD   Last thing she ate before this episode started - does not remember  No assoc with particular foods    She does not eat beef   Her diet is usually bland Some cabbage  More eggs lately   No new medicines   Had ccy years ago   Last colonoscopy 2017, diverticulosis and internal hemorrhoids and hyperplastic polyp EGD at the same time was normal , then again in 2018 with small HH Abd CTA showed 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   EXAM: CT ABDOMEN AND PELVIS WITH CONTRAST   TECHNIQUE: Multidetector CT imaging of the abdomen and pelvis was performed using the standard protocol following bolus administration of intravenous contrast.   CONTRAST:  48mL ISOVUE-370 IOPAMIDOL (ISOVUE-370) INJECTION 76%   COMPARISON:  05/08/2020   FINDINGS: Lower Chest: No acute findings.   Hepatobiliary: No hepatic masses identified. Prior cholecystectomy. No evidence of biliary obstruction.   Pancreas:  No mass or inflammatory changes.   Spleen: Within normal limits in size and appearance.   Adrenals/Urinary Tract: No masses identified. No evidence of ureteral calculi or hydronephrosis.   Stomach/Bowel: No evidence of obstruction, inflammatory process or abnormal fluid collections. Normal appendix visualized. Diverticulosis is seen involving the proximal sigmoid colon, however there is no evidence of diverticulitis.   Vascular/Lymphatic: No pathologically enlarged lymph nodes. No acute vascular findings. Aortic atherosclerotic calcification noted.   Reproductive: A 1.5 cm calcified uterine fibroid is seen. Adnexal regions are unremarkable.   Other:  None.   Musculoskeletal:  No suspicious bone lesions identified.   IMPRESSION: Colonic diverticulosis, without radiographic evidence of diverticulitis or other acute findings.   1.5 cm calcified uterine fibroid.   Aortic Atherosclerosis (ICD10-I70.0).      Her father had colon cancer   Patient Active Problem List   Diagnosis Date Noted   Current use of proton pump inhibitor 10/01/2021   General weakness 10/01/2021   Poor balance 10/01/2021   On  prednisone therapy 10/01/2021   Hyperlipidemia 10/15/2020   Pre-op exam 06/01/2020   Fatigue 01/30/2020   TMJ (dislocation of temporomandibular joint) 04/29/2019   Encounter for screening mammogram for breast cancer 10/27/2017   Routine general medical examination at a health care facility 10/27/2017   Generalized abdominal pain 03/31/2017   Aortic atherosclerosis ([place]) 02/03/2017   RA (rheumatoid arthritis) ([place])    Prediabetes    Adjustment disorder with mixed anxiety and depressed mood 06/24/2016   Essential hypertension 01/07/2016   Coronary atherosclerosis 11/07/2015   Osteoarthritis 05/10/2014   GERD 07/09/2007   Past Medical History:  Diagnosis Date   Allergic rhinitis    Cataract 2015   bilateral   COVID-19 10/2019   ASYMPTOMATIC   Diverticulosis    Esophageal reflux    Essential hypertension 01/07/2016   Fatty liver 2018   Fibula fracture 02/2005   Former smoker    Gastritis 2003   Heart murmur    MILD, NO CARDIOLOGIST   Hemorrhoids    History of cardiac dysrhythmia 2011   ABLATION  AT [facility]   History of kidney stones YRS AGO   Hyperglycemia    Hyperlipemia    Osteoarthritis    Personal history of colonic polyps 1998   hyperplastic   PMB (postmenopausal bleeding)    Pre-diabetes    BORDERLINE DIET CONTROLLED DOES NOT CHECK CBG   RA (rheumatoid arthritis) ([place])    Rectal bleed 2018   TRANSFUSION GIVEN   Shingles 2012   Uterine fibroid    Past Surgical History:  Procedure Laterality Date   CHOLECYSTECTOMY  YRS AGO   COLONOSCOPY  11/08   internal hemorrhoids   DILATATION & CURETTAGE/HYSTEROSCOPY WITH MYOSURE N/A 07/06/2020   Procedure: DILATATION & CURETTAGE/HYSTEROSCOPY;  Surgeon: Joseph Pierini, MD;  Location: [facility];  Service: Gynecology;  Laterality: N/A;  request 7:30am OR time in [place] block requests 30 minutes   EP procedure  1994   SVT; normal echo   ESOPHAGOGASTRODUODENOSCOPY  11/03   reactive gastropathy    EYE SURGERY Bilateral 2017   CATARACTS   LIPOMA EXCISION  11/2002   SVT s/p surgery--? ablation  2011   AT [place] AGO   vaginal polyp excised  12/2000   benign   Social History   Tobacco Use   Smoking status: Former    Packs/day: 0.75    Years: 15.00    Pack years: 11.25    Types: Cigarettes    Quit date: 10/21/2003    Years since quitting: 18.0   Smokeless tobacco: Never  Vaping Use   Vaping Use: Never used  Substance Use Topics   Alcohol use: No    Alcohol/week: 0.0 standard drinks   Drug use: No   Family History  Problem Relation Age of Onset   Colon cancer Father 90   Diabetes Mother    Heart disease Mother    Kidney disease Mother        ESRD   Stroke Mother    Hypertension Brother  Breast cancer Sister 48   Diabetes Other        nephews   Colon cancer Sister    Esophageal cancer Neg Hx    Stomach cancer Neg Hx    Rectal cancer Neg Hx    Allergies  Allergen Reactions   Bee Venom Anaphylaxis   Dicyclomine     Blurry vision   Humira [Adalimumab]     LIPS SWELLED AND RASH   Current Outpatient Medications on File Prior to Visit  Medication Sig Dispense Refill   acetaminophen (TYLENOL) 325 MG tablet Take 650 mg by mouth every 6 (six) hours as needed.     albuterol (PROVENTIL HFA;VENTOLIN HFA) 108 (90 Base) MCG/ACT inhaler Inhale 2 puffs into the lungs every 4 (four) hours as needed for wheezing or shortness of breath. Please give spacer if possible 1 Inhaler 3   amLODipine (NORVASC) 5 MG tablet TAKE 1 TABLET BY MOUTH EVERY DAY AT NOON 90 tablet 0   atorvastatin (LIPITOR) 10 MG tablet Take 0.5 tablets (5 mg total) by mouth every other day. 45 tablet 3   docusate sodium (COLACE) 100 MG capsule Take 1 capsule (100 mg total) by mouth 2 (two) times daily. 60 capsule 0   EPINEPHrine (EPIPEN IJ) Inject as directed as needed. Reported on 12/05/2015     fluticasone (FLONASE) 50 MCG/ACT nasal spray PLACE 2 SPRAYS INTO THE NOSE DAILY. (Patient  taking differently: as needed.) 48 g 3   folic acid (FOLVITE) 1 MG tablet Take 1 mg by mouth daily.     loratadine (CLARITIN) 10 MG tablet Take 10 mg by mouth daily. MAY TAKE AT HS ALSO     methotrexate (RHEUMATREX) 2.5 MG tablet Take 15 mg by mouth once a week. Caution:Chemotherapy. Protect from light. ( Monday of each week )     pantoprazole (PROTONIX) 40 MG tablet TAKE 1 TABLET BY MOUTH EVERY DAY AT NOON 90 tablet 1   polyethylene glycol (MIRALAX / GLYCOLAX) packet Take 17 g by mouth daily.     predniSONE (DELTASONE) 5 MG tablet Take 5 mg by mouth daily.     No current facility-administered medications on file prior to visit.    Review of Systems  Constitutional:  Negative for activity change, appetite change, fatigue, fever and unexpected weight change.  HENT:  Negative for congestion, ear pain, rhinorrhea, sinus pressure and sore throat.   Eyes:  Negative for pain, redness and visual disturbance.  Respiratory:  Negative for cough, shortness of breath and wheezing.   Cardiovascular:  Negative for chest pain and palpitations.  Gastrointestinal:  Positive for abdominal pain. Negative for abdominal distention, anal bleeding, blood in stool, constipation, diarrhea, nausea, rectal pain and vomiting.       No nausea today  Endocrine: Negative for polydipsia and polyuria.  Genitourinary:  Negative for dysuria, frequency and urgency.  Musculoskeletal:  Negative for arthralgias, back pain and myalgias.  Skin:  Negative for pallor and rash.  Allergic/Immunologic: Negative for environmental allergies.  Neurological:  Negative for dizziness, syncope and headaches.  Hematological:  Negative for adenopathy. Does not bruise/bleed easily.  Psychiatric/Behavioral:  Negative for decreased concentration and dysphoric mood. The patient is not nervous/anxious.       Objective:   Physical Exam Constitutional:      General: She is not in acute distress.    Appearance: She is well-developed. She is  obese. She is not ill-appearing or diaphoretic.  HENT:     Head: Normocephalic and atraumatic.  Mouth/Throat:     Mouth: Mucous membranes are moist.  Eyes:     General: No scleral icterus.    Conjunctiva/sclera: Conjunctivae normal.     Pupils: Pupils are equal, round, and reactive to light.  Cardiovascular:     Rate and Rhythm: Normal rate and regular rhythm.  Pulmonary:     Effort: Pulmonary effort is normal. No respiratory distress.     Breath sounds: Normal breath sounds. No stridor. No wheezing or rales.  Abdominal:     General: Abdomen is protuberant. Bowel sounds are normal.     Palpations: Abdomen is rigid. There is no hepatomegaly, splenomegaly, mass or pulsatile mass.     Tenderness: There is abdominal tenderness in the right lower quadrant, periumbilical area and left lower quadrant. There is no right CVA tenderness, left CVA tenderness, guarding or rebound. Negative signs include Murphy's sign and McBurney's sign.     Comments: Tenderness is worse periumbilical  No rebound or guarding   Musculoskeletal:     Cervical back: Normal range of motion and neck supple.  Lymphadenopathy:     Cervical: No cervical adenopathy.  Skin:    General: Skin is warm and dry.     Coloration: Skin is not jaundiced.     Findings: No erythema or rash.  Neurological:     Mental Status: She is alert.  Psychiatric:        Mood and Affect: Mood normal.          Assessment & Plan:   Problem List Items Addressed This Visit       Digestive   GERD    Continues protonix  Per pt- helpful  No heartburn recently       Relevant Medications   dicyclomine (BENTYL) 10 MG/5ML solution     Other   Generalized abdominal pain - Primary    Acute on chronic after a break of 3-4 months  Extensive GI w/u in the past- this was rev including notes (GI at [facility]), labs, CT abd/pel and CTA as well as colonoscopy and EGD Refilled dicyclomine for prn use for cramping/pain with caution of sedation   Unsure if timing of symptoms could correspond to prednisone? Encouraged good fluid intake  ER precautions discussed  Labs done  Consider GI f/u if no improvement  Meds ordered this encounter  Medications   dicyclomine (BENTYL) 10 MG/5ML solution    Sig: Take 1-2 mLs (2-4 mg total) by mouth 4 (four) times daily -  before meals and at bedtime. Prev with blurry vision on higher dose but not an allergy and may be able to tolerate lower dose.    Dispense:  100 mL    Refill:  3         Relevant Orders   CBC with Differential/Platelet   Basic metabolic panel   Hepatic function panel   Lipase

## 2021-11-19 NOTE — Assessment & Plan Note (Addendum)
Acute on chronic after a break of 3-4 months  Extensive GI w/u in the past- this was rev including notes (GI at [facility]), labs, CT abd/pel and CTA as well as colonoscopy and EGD Refilled dicyclomine for prn use for cramping/pain with caution of sedation  Unsure if timing of symptoms could correspond to prednisone? Encouraged good fluid intake  ER precautions discussed  Labs done  Consider GI f/u if no improvement  Meds ordered this encounter  Medications   dicyclomine (BENTYL) 10 MG/5ML solution    Sig: Take 1-2 mLs (2-4 mg total) by mouth 4 (four) times daily -  before meals and at bedtime. Prev with blurry vision on higher dose but not an allergy and may be able to tolerate lower dose.    Dispense:  100 mL    Refill:  3

## 2021-11-19 NOTE — Assessment & Plan Note (Signed)
Continues protonix  Per pt- helpful  No heartburn recently

## 2021-11-19 NOTE — Patient Instructions (Addendum)
Make sure you sip fluids   Keep a diet journal to help figure out what foods make you worse   Continue the protonix   Try the dicyclomine (the medicine for abdominal cramps) that Dr Damita Dunnings gave you   Labs today and then we will get an update   If severe -go to the ER- or if you start vomiting   Prednisone can upset your stomach - make sure you take it with a little food

## 2021-11-20 LAB — CBC WITH DIFFERENTIAL/PLATELET
Basophils Absolute: 0 10*3/uL (ref 0.0–0.1)
Basophils Relative: 0.4 % (ref 0.0–3.0)
Eosinophils Absolute: 0.1 10*3/uL (ref 0.0–0.7)
Eosinophils Relative: 1.3 % (ref 0.0–5.0)
HCT: 43.3 % (ref 36.0–46.0)
Hemoglobin: 14.2 g/dL (ref 12.0–15.0)
Lymphocytes Relative: 25.6 % (ref 12.0–46.0)
Lymphs Abs: 1.3 10*3/uL (ref 0.7–4.0)
MCHC: 32.8 g/dL (ref 30.0–36.0)
MCV: 86.1 fl (ref 78.0–100.0)
Monocytes Absolute: 0.2 10*3/uL (ref 0.1–1.0)
Monocytes Relative: 3.9 % (ref 3.0–12.0)
Neutro Abs: 3.4 10*3/uL (ref 1.4–7.7)
Neutrophils Relative %: 68.8 % (ref 43.0–77.0)
Platelets: 340 10*3/uL (ref 150.0–400.0)
RBC: 5.04 Mil/uL (ref 3.87–5.11)
RDW: 16.9 % — ABNORMAL HIGH (ref 11.5–15.5)
WBC: 5 10*3/uL (ref 4.0–10.5)

## 2021-11-20 LAB — BASIC METABOLIC PANEL
BUN: 8 mg/dL (ref 6–23)
CO2: 27 mEq/L (ref 19–32)
Calcium: 9.2 mg/dL (ref 8.4–10.5)
Chloride: 101 mEq/L (ref 96–112)
Creatinine, Ser: 0.93 mg/dL (ref 0.40–1.20)
GFR: 58.44 mL/min — ABNORMAL LOW (ref >60.00)
Glucose, Bld: 85 mg/dL (ref 70–99)
Potassium: 3.9 mEq/L (ref 3.5–5.1)
Sodium: 136 mEq/L (ref 135–145)

## 2021-11-20 LAB — HEPATIC FUNCTION PANEL
ALT: 10 U/L (ref 0–35)
AST: 24 U/L (ref 0–37)
Albumin: 4 g/dL (ref 3.5–5.2)
Alkaline Phosphatase: 58 U/L (ref 39–117)
Bilirubin, Direct: 0.2 mg/dL (ref 0.0–0.3)
Total Bilirubin: 0.6 mg/dL (ref 0.2–1.2)
Total Protein: 8.2 g/dL (ref 6.0–8.3)

## 2021-11-20 LAB — LIPASE: Lipase: 36 U/L (ref 11.0–59.0)

## 2021-11-22 DIAGNOSIS — M6281 Muscle weakness (generalized): Secondary | ICD-10-CM | POA: Diagnosis not present

## 2021-11-22 DIAGNOSIS — M25572 Pain in left ankle and joints of left foot: Secondary | ICD-10-CM | POA: Diagnosis not present

## 2021-11-22 DIAGNOSIS — M25551 Pain in right hip: Secondary | ICD-10-CM | POA: Diagnosis not present

## 2021-11-22 DIAGNOSIS — M25561 Pain in right knee: Secondary | ICD-10-CM | POA: Diagnosis not present

## 2021-11-22 DIAGNOSIS — M25571 Pain in right ankle and joints of right foot: Secondary | ICD-10-CM | POA: Diagnosis not present

## 2021-11-22 DIAGNOSIS — M25562 Pain in left knee: Secondary | ICD-10-CM | POA: Diagnosis not present

## 2021-11-22 DIAGNOSIS — M25552 Pain in left hip: Secondary | ICD-10-CM | POA: Diagnosis not present

## 2021-11-29 DIAGNOSIS — M25572 Pain in left ankle and joints of left foot: Secondary | ICD-10-CM | POA: Diagnosis not present

## 2021-11-29 DIAGNOSIS — M25551 Pain in right hip: Secondary | ICD-10-CM | POA: Diagnosis not present

## 2021-11-29 DIAGNOSIS — M25562 Pain in left knee: Secondary | ICD-10-CM | POA: Diagnosis not present

## 2021-11-29 DIAGNOSIS — M25561 Pain in right knee: Secondary | ICD-10-CM | POA: Diagnosis not present

## 2021-11-29 DIAGNOSIS — M6281 Muscle weakness (generalized): Secondary | ICD-10-CM | POA: Diagnosis not present

## 2021-11-29 DIAGNOSIS — M25571 Pain in right ankle and joints of right foot: Secondary | ICD-10-CM | POA: Diagnosis not present

## 2021-11-29 DIAGNOSIS — M25552 Pain in left hip: Secondary | ICD-10-CM | POA: Diagnosis not present

## 2021-12-06 DIAGNOSIS — M25551 Pain in right hip: Secondary | ICD-10-CM | POA: Diagnosis not present

## 2021-12-06 DIAGNOSIS — M25552 Pain in left hip: Secondary | ICD-10-CM | POA: Diagnosis not present

## 2021-12-06 DIAGNOSIS — M25561 Pain in right knee: Secondary | ICD-10-CM | POA: Diagnosis not present

## 2021-12-06 DIAGNOSIS — M25562 Pain in left knee: Secondary | ICD-10-CM | POA: Diagnosis not present

## 2021-12-06 DIAGNOSIS — M6281 Muscle weakness (generalized): Secondary | ICD-10-CM | POA: Diagnosis not present

## 2021-12-06 DIAGNOSIS — M25571 Pain in right ankle and joints of right foot: Secondary | ICD-10-CM | POA: Diagnosis not present

## 2021-12-06 DIAGNOSIS — M25572 Pain in left ankle and joints of left foot: Secondary | ICD-10-CM | POA: Diagnosis not present

## 2021-12-13 DIAGNOSIS — M6281 Muscle weakness (generalized): Secondary | ICD-10-CM | POA: Diagnosis not present

## 2021-12-13 DIAGNOSIS — M25551 Pain in right hip: Secondary | ICD-10-CM | POA: Diagnosis not present

## 2021-12-13 DIAGNOSIS — M25562 Pain in left knee: Secondary | ICD-10-CM | POA: Diagnosis not present

## 2021-12-13 DIAGNOSIS — M25552 Pain in left hip: Secondary | ICD-10-CM | POA: Diagnosis not present

## 2021-12-13 DIAGNOSIS — M25561 Pain in right knee: Secondary | ICD-10-CM | POA: Diagnosis not present

## 2021-12-13 DIAGNOSIS — M25571 Pain in right ankle and joints of right foot: Secondary | ICD-10-CM | POA: Diagnosis not present

## 2021-12-13 DIAGNOSIS — M25572 Pain in left ankle and joints of left foot: Secondary | ICD-10-CM | POA: Diagnosis not present

## 2021-12-20 DIAGNOSIS — M25571 Pain in right ankle and joints of right foot: Secondary | ICD-10-CM | POA: Diagnosis not present

## 2021-12-20 DIAGNOSIS — M25551 Pain in right hip: Secondary | ICD-10-CM | POA: Diagnosis not present

## 2021-12-20 DIAGNOSIS — M25562 Pain in left knee: Secondary | ICD-10-CM | POA: Diagnosis not present

## 2021-12-20 DIAGNOSIS — M6281 Muscle weakness (generalized): Secondary | ICD-10-CM | POA: Diagnosis not present

## 2021-12-20 DIAGNOSIS — M25552 Pain in left hip: Secondary | ICD-10-CM | POA: Diagnosis not present

## 2021-12-20 DIAGNOSIS — M25561 Pain in right knee: Secondary | ICD-10-CM | POA: Diagnosis not present

## 2021-12-20 DIAGNOSIS — M25572 Pain in left ankle and joints of left foot: Secondary | ICD-10-CM | POA: Diagnosis not present

## 2022-01-02 ENCOUNTER — Telehealth: Payer: Self-pay

## 2022-01-02 NOTE — Progress Notes (Signed)
? ? ?Chronic Care Management ?Pharmacy Assistant  ? ?Name: [name]  MRN: [id number] DOB: [birth year] ? ?Reason for Encounter: CCM (Hypertension Disease State) ?  ?Recent office visits:  ?11/19/2021 - Loura Pardon, MD - Abdominal Pain - No med changes.  ?11/11/2021 - Annual Wellness Visit - Ordered: Dexascan and Mammogram ?10/01/2021 Loura Pardon, MD - General weakness - Abnormal Labs: "Vit B12 level is low normal again  ?Take 1000 mcg B12 otc daily . A1c (for sugar) is up -prednisone plays a role". Referral to Physical Therapy.  ? ?Recent consult visits:  ?10/18/2021 - [facility] - Rheumatology - Rheumatoid arthritis. No other information.  ? ?Hospital visits:  ?None in previous 6 months ? ?Medications: ?Outpatient Encounter Medications as of 01/02/2022  ?Medication Sig  ? acetaminophen (TYLENOL) 325 MG tablet Take 650 mg by mouth every 6 (six) hours as needed.  ? albuterol (PROVENTIL HFA;VENTOLIN HFA) 108 (90 Base) MCG/ACT inhaler Inhale 2 puffs into the lungs every 4 (four) hours as needed for wheezing or shortness of breath. Please give spacer if possible  ? amLODipine (NORVASC) 5 MG tablet TAKE 1 TABLET BY MOUTH EVERY DAY AT NOON  ? atorvastatin (LIPITOR) 10 MG tablet Take 0.5 tablets (5 mg total) by mouth every other day.  ? dicyclomine (BENTYL) 10 MG/5ML solution Take 1-2 mLs (2-4 mg total) by mouth 4 (four) times daily -  before meals and at bedtime. Prev with blurry vision on higher dose but not an allergy and may be able to tolerate lower dose.  ? docusate sodium (COLACE) 100 MG capsule Take 1 capsule (100 mg total) by mouth 2 (two) times daily.  ? EPINEPHrine (EPIPEN IJ) Inject as directed as needed. Reported on 12/05/2015  ? fluticasone (FLONASE) 50 MCG/ACT nasal spray PLACE 2 SPRAYS INTO THE NOSE DAILY. (Patient taking differently: as needed.)  ? folic acid (FOLVITE) 1 MG tablet Take 1 mg by mouth daily.  ? loratadine (CLARITIN) 10 MG tablet Take 10 mg by mouth daily. MAY TAKE AT HS ALSO  ?  methotrexate (RHEUMATREX) 2.5 MG tablet Take 15 mg by mouth once a week. Caution:Chemotherapy. Protect from light. ( Monday of each week )  ? pantoprazole (PROTONIX) 40 MG tablet TAKE 1 TABLET BY MOUTH EVERY DAY AT NOON  ? polyethylene glycol (MIRALAX / GLYCOLAX) packet Take 17 g by mouth daily.  ? predniSONE (DELTASONE) 5 MG tablet Take 5 mg by mouth daily.  ? ?No facility-administered encounter medications on file as of 01/02/2022.  ? ?Recent Office Vitals: ?BP Readings from Last 3 Encounters:  ?11/19/21 136/78  ?10/01/21 132/76  ?05/09/21 130/80  ? ?Pulse Readings from Last 3 Encounters:  ?11/19/21 79  ?10/01/21 (!) 51  ?05/09/21 91  ?  ?Wt Readings from Last 3 Encounters:  ?11/19/21 196 lb 4 oz (89 kg)  ?11/11/21 203 lb (92.1 kg)  ?10/01/21 203 lb 8 oz (92.3 kg)  ?  ?Kidney Function ?Lab Results  ?Component Value Date/Time  ? CREATININE 0.93 11/19/2021 02:55 PM  ? CREATININE 0.82 10/01/2021 10:35 AM  ? GFR 58.44 (L) 11/19/2021 02:55 PM  ? GFRNONAA >60 01/26/2017 04:59 AM  ? GFRAA >60 01/26/2017 04:59 AM  ? ?BMP Latest Ref Rng & Units 11/19/2021 10/01/2021 02/04/2021  ?Glucose 70 - 99 mg/dL 85 87 103(H)  ?BUN 6 - 23 mg/dL '8 8 9  '$ ?Creatinine 0.40 - 1.20 mg/dL 0.93 0.82 0.84  ?Sodium 135 - 145 mEq/L 136 141 139  ?Potassium 3.5 - 5.1 mEq/L 3.9 3.8  3.7  ?Chloride 96 - 112 mEq/L 101 104 103  ?CO2 19 - 32 mEq/L '27 29 27  '$ ?Calcium 8.4 - 10.5 mg/dL 9.2 8.8 9.4  ? ?Contacted patient on 01/07/2022 to discuss hypertension disease state ? ?Current antihypertensive regimen:  ?Amlodipine 5 mg - 1 tablet daily ? Patient verbally confirms she is taking the above medications as directed. Yes ? ?How often are you checking your Blood Pressure? Patient has not been monitoring her blood pressure at home. She does not have a blood pressure cuff.  ? ?Wrist or arm cuff: None ?Caffeine intake: Patient drinks soda (Sprite) occasionally; 1 cup of coffee a day ?Salt intake: Patient does not add salt after she is finished cooking.  ?Over the  counter medications including pseudoephedrine or NSAIDs? Claritin daily. Tylenol 325 mg 1-2 tabs as needed due to leg pain. ? ?What recent interventions/DTPs have been made by any provider to improve Blood Pressure control since last CPP Visit: No recent interventions.  ? ?Any recent hospitalizations or ED visits since last visit with CPP? No ? ?What diet changes have been made to improve Blood Pressure Control?  ?Patient has not been eating a whole lot; her appetite comes and goes. She has been watching her portions.  ? ?What exercise is being done to improve your Blood Pressure Control?  ?Patient had her last physical therapy appointment last Friday. That was her exercise she was getting.  ? ?Adherence Review: ?Is the patient currently on ACE/ARB medication? No ?Does the patient have >5 day gap between last estimated fill dates? Yes - Atorvastatin - Patient stated she will go to the pharmacy and pick it up.  ? ?Star Rating Drugs:  ?Medication:  Last Fill: Day Supply ?Atorvastatin 10 mg 10/04/2021 90  ? ?Care Gaps: ?Annual wellness visit in last year? Yes 11/11/2021 ?Most Recent BP reading: 136/78 on 11/19/2021 ? ?Upcoming appointments: ?PCP appointment on 01/17/2022  for lab ? ?Charlene Brooke, CPP notified ? ?Marijean Niemann, RMA ?Clinical Pharmacy Assistant ?[phone] ? ? ? ? ? ? ?

## 2022-01-03 DIAGNOSIS — M25572 Pain in left ankle and joints of left foot: Secondary | ICD-10-CM | POA: Diagnosis not present

## 2022-01-03 DIAGNOSIS — M25561 Pain in right knee: Secondary | ICD-10-CM | POA: Diagnosis not present

## 2022-01-03 DIAGNOSIS — M25562 Pain in left knee: Secondary | ICD-10-CM | POA: Diagnosis not present

## 2022-01-03 DIAGNOSIS — M6281 Muscle weakness (generalized): Secondary | ICD-10-CM | POA: Diagnosis not present

## 2022-01-03 DIAGNOSIS — M25571 Pain in right ankle and joints of right foot: Secondary | ICD-10-CM | POA: Diagnosis not present

## 2022-01-03 DIAGNOSIS — M25551 Pain in right hip: Secondary | ICD-10-CM | POA: Diagnosis not present

## 2022-01-03 DIAGNOSIS — M25552 Pain in left hip: Secondary | ICD-10-CM | POA: Diagnosis not present

## 2022-01-16 ENCOUNTER — Telehealth: Payer: Self-pay | Admitting: Family Medicine

## 2022-01-16 DIAGNOSIS — M069 Rheumatoid arthritis, unspecified: Secondary | ICD-10-CM

## 2022-01-16 DIAGNOSIS — I10 Essential (primary) hypertension: Secondary | ICD-10-CM

## 2022-01-16 DIAGNOSIS — E7800 Pure hypercholesterolemia, unspecified: Secondary | ICD-10-CM

## 2022-01-16 DIAGNOSIS — R7303 Prediabetes: Secondary | ICD-10-CM

## 2022-01-16 NOTE — Telephone Encounter (Signed)
-----   Message from Velna Hatchet, RT sent at 01/08/2022 10:59 AM EDT ----- ?Regarding: Lab order for Friday, 01/17/22 ?Lab order for appt on 01/17/22, please.  Thanks, Anda Kraft ? ?

## 2022-01-17 ENCOUNTER — Other Ambulatory Visit (INDEPENDENT_AMBULATORY_CARE_PROVIDER_SITE_OTHER): Payer: Medicare Other

## 2022-01-17 DIAGNOSIS — E7800 Pure hypercholesterolemia, unspecified: Secondary | ICD-10-CM | POA: Diagnosis not present

## 2022-01-17 DIAGNOSIS — I10 Essential (primary) hypertension: Secondary | ICD-10-CM

## 2022-01-17 DIAGNOSIS — R7303 Prediabetes: Secondary | ICD-10-CM

## 2022-01-17 LAB — COMPREHENSIVE METABOLIC PANEL
ALT: 6 U/L (ref 0–35)
AST: 17 U/L (ref 0–37)
Albumin: 3.9 g/dL (ref 3.5–5.2)
Alkaline Phosphatase: 57 U/L (ref 39–117)
BUN: 10 mg/dL (ref 6–23)
CO2: 28 mEq/L (ref 19–32)
Calcium: 9 mg/dL (ref 8.4–10.5)
Chloride: 104 mEq/L (ref 96–112)
Creatinine, Ser: 0.82 mg/dL (ref 0.40–1.20)
GFR: 67.9 mL/min (ref >60.00)
Glucose, Bld: 92 mg/dL (ref 70–99)
Potassium: 3.9 mEq/L (ref 3.5–5.1)
Sodium: 138 mEq/L (ref 135–145)
Total Bilirubin: 0.4 mg/dL (ref 0.2–1.2)
Total Protein: 7.4 g/dL (ref 6.0–8.3)

## 2022-01-17 LAB — LIPID PANEL
Cholesterol: 137 mg/dL (ref 0–200)
HDL: 46 mg/dL (ref >39.00)
LDL Cholesterol: 64 mg/dL (ref 0–99)
NonHDL: 91.2
Total CHOL/HDL Ratio: 3
Triglycerides: 136 mg/dL (ref 0.0–149.0)
VLDL: 27.2 mg/dL (ref 0.0–40.0)

## 2022-01-17 LAB — CBC WITH DIFFERENTIAL/PLATELET
Basophils Absolute: 0 10*3/uL (ref 0.0–0.1)
Basophils Relative: 0.7 % (ref 0.0–3.0)
Eosinophils Absolute: 0.1 10*3/uL (ref 0.0–0.7)
Eosinophils Relative: 3.1 % (ref 0.0–5.0)
HCT: 38.4 % (ref 36.0–46.0)
Hemoglobin: 12.9 g/dL (ref 12.0–15.0)
Lymphocytes Relative: 33.9 % (ref 12.0–46.0)
Lymphs Abs: 1.1 10*3/uL (ref 0.7–4.0)
MCHC: 33.5 g/dL (ref 30.0–36.0)
MCV: 84.9 fl (ref 78.0–100.0)
Monocytes Absolute: 0.1 10*3/uL (ref 0.1–1.0)
Monocytes Relative: 3.4 % (ref 3.0–12.0)
Neutro Abs: 1.9 10*3/uL (ref 1.4–7.7)
Neutrophils Relative %: 58.9 % (ref 43.0–77.0)
Platelets: 256 10*3/uL (ref 150.0–400.0)
RBC: 4.52 Mil/uL (ref 3.87–5.11)
RDW: 17.1 % — ABNORMAL HIGH (ref 11.5–15.5)
WBC: 3.2 10*3/uL — ABNORMAL LOW (ref 4.0–10.5)

## 2022-01-17 LAB — HEMOGLOBIN A1C: Hgb A1c MFr Bld: 6.8 % — ABNORMAL HIGH (ref 4.6–6.5)

## 2022-01-20 ENCOUNTER — Encounter: Payer: Self-pay | Admitting: *Deleted

## 2022-01-23 ENCOUNTER — Ambulatory Visit (INDEPENDENT_AMBULATORY_CARE_PROVIDER_SITE_OTHER): Payer: Medicare Other | Admitting: Family Medicine

## 2022-01-23 ENCOUNTER — Encounter: Payer: Self-pay | Admitting: Family Medicine

## 2022-01-23 VITALS — BP 135/80 | HR 81 | Temp 97.3°F | Ht 67.0 in | Wt 200.5 lb

## 2022-01-23 DIAGNOSIS — R7303 Prediabetes: Secondary | ICD-10-CM | POA: Diagnosis not present

## 2022-01-23 DIAGNOSIS — E785 Hyperlipidemia, unspecified: Secondary | ICD-10-CM | POA: Diagnosis not present

## 2022-01-23 DIAGNOSIS — E1169 Type 2 diabetes mellitus with other specified complication: Secondary | ICD-10-CM

## 2022-01-23 DIAGNOSIS — E119 Type 2 diabetes mellitus without complications: Secondary | ICD-10-CM | POA: Diagnosis not present

## 2022-01-23 DIAGNOSIS — I10 Essential (primary) hypertension: Secondary | ICD-10-CM | POA: Diagnosis not present

## 2022-01-23 MED ORDER — METFORMIN HCL ER 500 MG PO TB24: 500.0000 mg | ORAL_TABLET | Freq: Every day | ORAL | 1 refills | Status: DC

## 2022-01-23 MED ORDER — FLUTICASONE PROPIONATE 50 MCG/ACT NA SUSP: NASAL | 3 refills | Status: DC

## 2022-01-23 NOTE — Addendum Note (Signed)
Addended by: Kris Mouton on: 01/23/2022 02:47 PM ? ? Modules accepted: Orders ? ?

## 2022-01-23 NOTE — Progress Notes (Signed)
? ?Subjective:  ? ? Patient ID: [name], female    DOB: 01-Feb-1941, 81 y.o.   MRN: [id number] ? ?HPI ?Pt presents for f/u of elevated a1c ? ?Wt Readings from Last 3 Encounters:  ?01/23/22 200 lb 8 oz (90.9 kg)  ?11/19/21 196 lb 4 oz (89 kg)  ?11/11/21 203 lb (92.1 kg)  ? ?31.40 kg/m? ? ? ?Lab Results  ?Component Value Date  ? HGBA1C 6.8 (H) 01/17/2022  ? ?This is up from 6.6  ?She takes prednisone for RA ? ?Good days and bad days with diet  ?Avoids fried food and starch ? ?Does eat candy  ?Ice cream  ? ?One soda daily  ? ?Pasta-not much  ?Bread is regular  ? ?Not much exercise due to arthritis  ?Mother had diabetes  ? ?Some tingling in fingers and toes ?At times a little numb  ?Some frequent urination  ?Thirsty-drinks water ? ?BP Readings from Last 3 Encounters:  ?01/23/22 135/80  ?11/19/21 136/78  ?10/01/21 132/76  ? ?Has not been on an ace ? ?Lab Results  ?Component Value Date  ? CHOL 137 01/17/2022  ? HDL 46.00 01/17/2022  ? [place] 64 01/17/2022  ? LDLDIRECT 60.0 10/26/2018  ? TRIG 136.0 01/17/2022  ? CHOLHDL 3 01/17/2022  ? ?Takes atorvastatin 5 mg every other day ? ? ?Labs sent to her rheumatologist ?Lab Results  ?Component Value Date  ? CREATININE 0.82 01/17/2022  ? BUN 10 01/17/2022  ? NA 138 01/17/2022  ? K 3.9 01/17/2022  ? CL 104 01/17/2022  ? CO2 28 01/17/2022  ? ?Lab Results  ?Component Value Date  ? ALT 6 01/17/2022  ? AST 17 01/17/2022  ? ALKPHOS 57 01/17/2022  ? BILITOT 0.4 01/17/2022  ?  ?Patient Active Problem List  ? Diagnosis Date Noted  ? Current use of proton pump inhibitor 10/01/2021  ? General weakness 10/01/2021  ? Poor balance 10/01/2021  ? On prednisone therapy 10/01/2021  ? Controlled type 2 diabetes mellitus without complication, without long-term current use of insulin ([place]) 11/13/2020  ? Hyperlipidemia associated with type 2 diabetes mellitus ([place]) 10/15/2020  ? Pre-op exam 06/01/2020  ? Fatigue 01/30/2020  ? TMJ (dislocation of temporomandibular joint) 04/29/2019  ? Encounter  for screening mammogram for breast cancer 10/27/2017  ? Routine general medical examination at a health care facility 10/27/2017  ? Generalized abdominal pain 03/31/2017  ? Aortic atherosclerosis ([place]) 02/03/2017  ? RA (rheumatoid arthritis) ([place])   ? Adjustment disorder with mixed anxiety and depressed mood 06/24/2016  ? Essential hypertension 01/07/2016  ? Coronary atherosclerosis 11/07/2015  ? Osteoarthritis 05/10/2014  ? GERD 07/09/2007  ? ?Past Medical History:  ?Diagnosis Date  ? Allergic rhinitis   ? Cataract 2015  ? bilateral  ? COVID-19 10/2019  ? ASYMPTOMATIC  ? Diverticulosis   ? Esophageal reflux   ? Essential hypertension 01/07/2016  ? Fatty liver 2018  ? Fibula fracture 02/2005  ? Former smoker   ? Gastritis 2003  ? Heart murmur   ? MILD, NO CARDIOLOGIST  ? Hemorrhoids   ? History of cardiac dysrhythmia 2011  ? ABLATION  AT [facility]  ? History of kidney stones YRS AGO  ? Hyperglycemia   ? Hyperlipemia   ? Osteoarthritis   ? Personal history of colonic polyps 1998  ? hyperplastic  ? PMB (postmenopausal bleeding)   ? Pre-diabetes   ? BORDERLINE DIET CONTROLLED DOES NOT CHECK CBG  ? RA (rheumatoid arthritis) ([place])   ? Rectal bleed 2018  ?  TRANSFUSION GIVEN  ? Shingles 2012  ? Uterine fibroid   ? ?Past Surgical History:  ?Procedure Laterality Date  ? CHOLECYSTECTOMY  YRS AGO  ? COLONOSCOPY  11/08  ? internal hemorrhoids  ? DILATATION & CURETTAGE/HYSTEROSCOPY WITH MYOSURE N/A 07/06/2020  ? Procedure: DILATATION & CURETTAGE/HYSTEROSCOPY;  Surgeon: Joseph Pierini, MD;  Location: [facility];  Service: Gynecology;  Laterality: N/A;  request 7:30am OR time in [place] block ?requests 30 minutes  ? EP procedure  1994  ? SVT; normal echo  ? ESOPHAGOGASTRODUODENOSCOPY  11/03  ? reactive gastropathy  ? EYE SURGERY Bilateral 2017  ? CATARACTS  ? LIPOMA EXCISION  11/2002  ? SVT s/p surgery--? ablation  2011  ? AT [facility]  ? TUBAL LIGATION  YRS AGO  ? vaginal polyp excised  12/2000  ? benign   ? ?Social History  ? ?Tobacco Use  ? Smoking status: Former  ?  Packs/day: 0.75  ?  Years: 15.00  ?  Pack years: 11.25  ?  Types: Cigarettes  ?  Quit date: 10/21/2003  ?  Years since quitting: 18.2  ? Smokeless tobacco: Never  ?Vaping Use  ? Vaping Use: Never used  ?Substance Use Topics  ? Alcohol use: No  ?  Alcohol/week: 0.0 standard drinks  ? Drug use: No  ? ?Family History  ?Problem Relation Age of Onset  ? Colon cancer Father 39  ? Diabetes Mother   ? Heart disease Mother   ? Kidney disease Mother   ?     ESRD  ? Stroke Mother   ? Hypertension Brother   ? Breast cancer Sister 26  ? Diabetes Other   ?     nephews  ? Colon cancer Sister   ? Esophageal cancer Neg Hx   ? Stomach cancer Neg Hx   ? Rectal cancer Neg Hx   ? ?Allergies  ?Allergen Reactions  ? Bee Venom Anaphylaxis  ? Dicyclomine   ?  Blurry vision  ? Humira [Adalimumab]   ?  LIPS SWELLED AND RASH  ? ?Current Outpatient Medications on File Prior to Visit  ?Medication Sig Dispense Refill  ? acetaminophen (TYLENOL) 325 MG tablet Take 650 mg by mouth every 6 (six) hours as needed.    ? albuterol (PROVENTIL HFA;VENTOLIN HFA) 108 (90 Base) MCG/ACT inhaler Inhale 2 puffs into the lungs every 4 (four) hours as needed for wheezing or shortness of breath. Please give spacer if possible 1 Inhaler 3  ? amLODipine (NORVASC) 5 MG tablet TAKE 1 TABLET BY MOUTH EVERY DAY AT NOON 90 tablet 0  ? atorvastatin (LIPITOR) 10 MG tablet Take 0.5 tablets (5 mg total) by mouth every other day. 45 tablet 3  ? dicyclomine (BENTYL) 10 MG/5ML solution Take 1-2 mLs (2-4 mg total) by mouth 4 (four) times daily -  before meals and at bedtime. Prev with blurry vision on higher dose but not an allergy and may be able to tolerate lower dose. 100 mL 3  ? docusate sodium (COLACE) 100 MG capsule Take 1 capsule (100 mg total) by mouth 2 (two) times daily. 60 capsule 0  ? EPINEPHrine (EPIPEN IJ) Inject as directed as needed. Reported on 2/53/6644    ? folic acid (FOLVITE) 1 MG tablet Take 1 mg  by mouth daily.    ? loratadine (CLARITIN) 10 MG tablet Take 10 mg by mouth daily. MAY TAKE AT HS ALSO    ? methotrexate (RHEUMATREX) 2.5 MG tablet Take 15 mg by mouth  once a week. Caution:Chemotherapy. Protect from light. ( Monday of each week )    ? pantoprazole (PROTONIX) 40 MG tablet TAKE 1 TABLET BY MOUTH EVERY DAY AT NOON 90 tablet 1  ? polyethylene glycol (MIRALAX / GLYCOLAX) packet Take 17 g by mouth daily.    ? predniSONE (DELTASONE) 5 MG tablet Take 5 mg by mouth daily.    ? ?No current facility-administered medications on file prior to visit.  ?  ?Review of Systems  ?Constitutional:  Negative for activity change, appetite change, fatigue, fever and unexpected weight change.  ?HENT:  Negative for congestion, ear pain, rhinorrhea, sinus pressure and sore throat.   ?Eyes:  Negative for pain, redness and visual disturbance.  ?Respiratory:  Negative for cough, shortness of breath and wheezing.   ?Cardiovascular:  Negative for chest pain and palpitations.  ?Gastrointestinal:  Negative for abdominal pain, blood in stool, constipation and diarrhea.  ?Endocrine: Positive for polydipsia and polyuria.  ?Genitourinary:  Negative for dysuria, frequency and urgency.  ?Musculoskeletal:  Negative for arthralgias, back pain and myalgias.  ?Skin:  Negative for pallor and rash.  ?Allergic/Immunologic: Negative for environmental allergies.  ?Neurological:  Negative for dizziness, syncope and headaches.  ?     Occ fingers and toes tingle  ?Hematological:  Negative for adenopathy. Does not bruise/bleed easily.  ?Psychiatric/Behavioral:  Negative for decreased concentration and dysphoric mood. The patient is not nervous/anxious.   ? ?   ?Objective:  ? Physical Exam ?Constitutional:   ?   General: She is not in acute distress. ?   Appearance: Normal appearance. She is well-developed. She is obese. She is not ill-appearing or diaphoretic.  ?HENT:  ?   Head: Normocephalic and atraumatic.  ?Eyes:  ?   Conjunctiva/sclera:  Conjunctivae normal.  ?   Pupils: Pupils are equal, round, and reactive to light.  ?Neck:  ?   Thyroid: No thyromegaly.  ?   Vascular: No carotid bruit or JVD.  ?Cardiovascular:  ?   Rate and Rhythm: Normal rate and re

## 2022-01-23 NOTE — Patient Instructions (Addendum)
Get rid of any drink with calories or sugar  ? ?Avoid sweets  ?Cut back on starches (replace them with fruit and vegetables - all exception of white potato) ? ?Try to get most of your carbohydrates from produce (with the exception of white potatoes)  ?Eat less bread/pasta/rice/snack foods/cereals/sweets and other items from the middle of the grocery store (processed carbs) ? ?I will refer you to diabetic teacher/class  ?You will get a call to set this up  ?Start metformin xr 500 mg one pill daily with breakfast  ?If side effects let me know ? ?Consider a water exercise program for exercise  ? ?Follow up in 3 months  ? ? ? ? ? ?

## 2022-01-23 NOTE — Assessment & Plan Note (Signed)
a1c is back in the diabetic range ?Lab Results  ?Component Value Date  ? HGBA1C 6.8 (H) 01/17/2022  ? ?Chronic prednisone 5 mg daily for RA/ will likely stay on this  ?Diet is not great  ?Disc low glycemic diet in detail and ref done to DM ed  ?Unable to exercise due to severe RA, I suggested water exercise as an option  ?Will start metformin xr 500 mg daily, rev poss side effects and handout given  ?F/u 3 mo  ?Will need to discuss ace and statin next time  ?Exam is reassuring  ?

## 2022-01-23 NOTE — Assessment & Plan Note (Signed)
bp in fair control at this time  ?BP Readings from Last 1 Encounters:  ?01/23/22 135/80  ? ?No changes needed ?Most recent labs reviewed  ?Disc lifstyle change with low sodium diet and exercise  ?Amlodipine 5 mg daily  ? ?

## 2022-01-23 NOTE — Assessment & Plan Note (Signed)
Disc goals for lipids and reasons to control them ?Rev last labs with pt ?Rev low sat fat diet in detail ?LDL of 64 ?Atorvastatin 5 mg every other day  ?Disc diet changes  ?

## 2022-01-23 NOTE — Addendum Note (Signed)
Addended by: Kris Mouton on: 01/23/2022 02:51 PM ? ? Modules accepted: Orders ? ?

## 2022-02-03 ENCOUNTER — Encounter: Payer: Self-pay | Admitting: Dietician

## 2022-02-03 ENCOUNTER — Encounter: Payer: Medicare Other | Attending: Family Medicine | Admitting: Dietician

## 2022-02-03 DIAGNOSIS — E119 Type 2 diabetes mellitus without complications: Secondary | ICD-10-CM | POA: Diagnosis not present

## 2022-02-03 NOTE — Patient Instructions (Addendum)
Recommend a Vitamin B-12 (sublingual or dissolvable).  Your vitamin B-12 lab was low. ?Great job on the changes that you have made! ?Continue to choose water and other beverages without carbohydrates. ?Continue to bake rather than fry most often ?Choose whole grains most often (whole wheat bread, brown rice, oatmeal, etc) rather than processed grains ?Half your plate should be non-starchy vegetables ?Choose a lean protein with each meal (egg, raw walnuts or almonds, peanut butter, slice of cheese, yogurt, chicken, fish for example) ?Fresh fruit is better than canned (berries, fresh apple or pear, melon, etc).  Unsweetened applesauce and other unsweetened fruit is also acceptable.  ? ?Please call for any questions. ? ?

## 2022-02-03 NOTE — Progress Notes (Signed)
? ?Diabetes Self-Management Education ? ?Visit Type: First/Initial ? ?Appt. Start Time: 1615 Appt. End Time: 1700 ? ?02/03/2022 ? ?[name], identified by name and date of birth, is a 81 y.o. female with a diagnosis of Diabetes: Type 2.  ? ?ASSESSMENT ?This is a Phone visit as patient fell this morning (backwards) in her bathroom.  She did not hit her head and states she is uninjured but a little more difficulty walking at this time.  She has balance issues. ?Patient and husband are both on this call.  They are at their home and I am in my office. ?She would like to learn more about newly diagnosed diabetes ? ?History includes Type 2 diabetes, rheumatoid arthritis ?Medications include prednisone, metformin, folic acid  ?Labs noted to include:  A1C 6.8% 3/312023 increased from 6.6% 10/01/2021 ?Vitamin B-12 224 10/01/2021.  She states that she is starting to have symptoms of neuropathy. ? ?Patient lives with her husband and grandson. They share shopping and cooking. 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 ?Unable to exercise, balance issues, leg pain, arthritis. ? ?Height '5\' 7"'$  (1.702 m), weight 200 lb (90.7 kg). ?Body mass index is 31.32 kg/m?. ? ? Diabetes Self-Management Education - 02/03/22 1620   ? ?  ? Visit Information  ? Visit Type First/Initial   ?  ? Initial Visit  ? Diabetes Type Type 2   ? Are you currently following a meal plan? No   ? Are you taking your medications as prescribed? Yes   ? Date Diagnosed 01/2022   ?  ? Health Coping  ? How would you rate your overall health? Fair   ?  ? Psychosocial Assessment  ? Patient Belief/Attitude about Diabetes Motivated to manage diabetes   ? Self-care barriers None   ? Self-management support Doctor's office;Family   ? Other persons present Patient   ? Patient Concerns Nutrition/Meal planning;Healthy Lifestyle   ? Special Needs None   ? Learning Readiness Ready   ? How often do you need to have someone help you when you read  instructions, pamphlets, or other written materials from your doctor or pharmacy? 1 - Never   ? What is the last grade level you completed in school? 12   ?  ? Pre-Education Assessment  ? Patient understands the diabetes disease and treatment process. Needs Instruction   ? Patient understands incorporating nutritional management into lifestyle. Needs Instruction   ? Patient undertands incorporating physical activity into lifestyle. Needs Instruction   ? Patient understands using medications safely. Needs Instruction   ? Patient understands monitoring blood glucose, interpreting and using results Needs Instruction   ? Patient understands prevention, detection, and treatment of acute complications. Needs Instruction   ? Patient understands prevention, detection, and treatment of chronic complications. Needs Instruction   ? Patient understands how to develop strategies to address psychosocial issues. Needs Instruction   ? Patient understands how to develop strategies to promote health/change behavior. Needs Instruction   ?  ? Complications  ? Last HgB A1C per patient/outside source 6.8 %   01/17/2022 increased from 6.6% 10/01/2021  ? How often do you check your blood sugar? 0 times/day (not testing)   ? Have you had a dilated eye exam in the past 12 months? No   ? Have you had a dental exam in the past 12 months? Yes   ? Are you checking your feet? No   ?  ? Dietary Intake  ?  Breakfast instant oatmeal OR egg, toast, grits   10  ? Snack (morning) none   ? Lunch broiled fish, cabbage, broccoli OR sardines packed in water and onions, bread   4-5  ? Snack (afternoon) none   ? Dinner none   ? Snack (evening) sugar free jello   ? Beverage(s) water, coffee with splenda, occasional sweet tea   ?  ? Exercise  ? Exercise Type ADL's   ?  ? Patient Education  ? Previous Diabetes Education No   ? Disease state  Definition of diabetes, type 1 and 2, and the diagnosis of diabetes;Factors that contribute to the development of diabetes    my plate  ? Nutrition management  Role of diet in the treatment of diabetes and the relationship between the three main macronutrients and blood glucose level;Meal options for control of blood glucose level and chronic complications.;Other (comment)   ? Physical activity and exercise  Role of exercise on diabetes management, blood pressure control and cardiac health.;Helped patient identify appropriate exercises in relation to his/her diabetes, diabetes complications and other health issue.   ? Medications Reviewed patients medication for diabetes, action, purpose, timing of dose and side effects.   ? Monitoring Other (comment)   A1C goals  ? Acute complications Taught treatment of hypoglycemia - the 15 rule.;Discussed and identified patients' treatment of hyperglycemia.   ? Chronic complications Relationship between chronic complications and blood glucose control   ?  ? Individualized Goals (developed by patient)  ? Nutrition General guidelines for healthy choices and portions discussed   ? Physical Activity Exercise 3-5 times per week;15 minutes per day   ? Medications take my medication as prescribed   ? Reducing Risk increase portions of healthy fats   ?  ? Post-Education Assessment  ? Patient understands the diabetes disease and treatment process. Demonstrates understanding / competency   ? Patient understands incorporating nutritional management into lifestyle. Demonstrates understanding / competency   ? Patient undertands incorporating physical activity into lifestyle. Demonstrates understanding / competency   ? Patient understands using medications safely. Demonstrates understanding / competency   ? Patient understands monitoring blood glucose, interpreting and using results Demonstrates understanding / competency   ? Patient understands prevention, detection, and treatment of acute complications. Demonstrates understanding / competency   ? Patient understands prevention, detection, and treatment of chronic  complications. Demonstrates understanding / competency   ? Patient understands how to develop strategies to address psychosocial issues. Demonstrates understanding / competency   ? Patient understands how to develop strategies to promote health/change behavior. Needs Review   ?  ? Outcomes  ? Expected Outcomes Demonstrated interest in learning. Expect positive outcomes   ? Future DMSE 2 months   ? Program Status Not Completed   ? ?  ?  ? ?  ? ? ?Individualized Plan for Diabetes Self-Management Training:  ? ?Learning Objective:  Patient will have a greater understanding of diabetes self-management. ?Patient education plan is to attend individual and/or group sessions per assessed needs and concerns. ?  ?Plan:  ? ?Patient Instructions  ?Recommend a Vitamin B-12 (sublingual or dissolvable).  Your vitamin B-12 lab was low. ?Great job on the changes that you have made! ?Continue to choose water and other beverages without carbohydrates. ?Continue to bake rather than fry most often ?Choose whole grains most often (whole wheat bread, brown rice, oatmeal, etc) rather than processed grains ?Half your plate should be non-starchy vegetables ?Choose a lean protein with each meal (egg, raw walnuts  or almonds, peanut butter, slice of cheese, yogurt, chicken, fish for example) ?Fresh fruit is better than canned (berries, fresh apple or pear, melon, etc).  Unsweetened applesauce and other unsweetened fruit is also acceptable.  ? ?Please call for any questions. ? ? ?Expected Outcomes:  Demonstrated interest in learning. Expect positive outcomes ? ?Education material provided: ADA - How to Thrive: A Guide for Your Journey with Diabetes and My Plate; Carbohydrate counting with diabetes from AND ? ?If problems or questions, patient to contact team via:  Phone ? ?Future DSME appointment: 2 months ?

## 2022-02-04 ENCOUNTER — Ambulatory Visit: Payer: Medicare Other | Admitting: Dietician

## 2022-02-17 ENCOUNTER — Other Ambulatory Visit: Payer: Self-pay | Admitting: Family Medicine

## 2022-02-27 DIAGNOSIS — Z961 Presence of intraocular lens: Secondary | ICD-10-CM | POA: Diagnosis not present

## 2022-03-10 ENCOUNTER — Telehealth: Payer: Self-pay | Admitting: Family Medicine

## 2022-03-10 NOTE — Telephone Encounter (Signed)
Thanks for letting me know  What does the rash look like? Where is it? Itching? Any s/s of anaphylaxis ?   Hold the medicine please  Let me know how the rash is in 2-3 days or f/u if needed  I don't want to try anything else until the rash improves

## 2022-03-10 NOTE — Telephone Encounter (Signed)
Pt did take med this morning. Pt said she has developed red itchy bumps that are on her side upper legs and both arms. Pt said she didn't start with the rash until she started metformin. No sxs of anaphylaxis reactions. Pt advised to hold med until we call back.

## 2022-03-10 NOTE — Telephone Encounter (Signed)
Hold the medication and monitor If not resolved in a week f/u  If not improving f/u   Either case please update me in 1 week and we will make a plan

## 2022-03-10 NOTE — Telephone Encounter (Signed)
Patient would like to discuss metformin HCL. She has been breaking out a rash since she started taking it.

## 2022-03-10 NOTE — Telephone Encounter (Signed)
Pt notified of Dr. Marliss Coots instructions and verbalized understanding. She will hold med and update [place] in a week

## 2022-04-01 ENCOUNTER — Encounter: Payer: Self-pay | Admitting: Family Medicine

## 2022-04-01 ENCOUNTER — Ambulatory Visit (INDEPENDENT_AMBULATORY_CARE_PROVIDER_SITE_OTHER): Payer: Medicare Other | Admitting: Family Medicine

## 2022-04-01 VITALS — BP 135/70 | HR 77 | Temp 97.9°F | Resp 16 | Ht 67.0 in | Wt 199.4 lb

## 2022-04-01 DIAGNOSIS — E119 Type 2 diabetes mellitus without complications: Secondary | ICD-10-CM

## 2022-04-01 DIAGNOSIS — I10 Essential (primary) hypertension: Secondary | ICD-10-CM

## 2022-04-01 MED ORDER — BLOOD GLUCOSE METER KIT: PACK | 0 refills | Status: AC

## 2022-04-01 MED ORDER — BLOOD GLUCOSE METER KIT: PACK | 0 refills | Status: DC

## 2022-04-01 NOTE — Assessment & Plan Note (Signed)
Lab Results  Component Value Date   HGBA1C 6.8 (H) 01/17/2022   Allergic to metformin xr Had a nutrition visit -some imp in diet  On prednisone long term  Px sent for glucose test supplies Then sched nurse visit for instruction  Then f/u with 2 wk of readings  Will consider medication once we are certain she can monitor and she needs it  Disc reasons to treat dm/ risk of end organ damage Disc imp of eye and foot care

## 2022-04-01 NOTE — Patient Instructions (Addendum)
For exercise - keep moving  Indoor walking  Use you floor pedaler   Try to get most of your carbohydrates from produce (with the exception of white potatoes)  Eat less bread/pasta/rice/snack foods/cereals/sweets and other items from the middle of the grocery store (processed carbs)   For cholesterol Avoid red meat and fatty pork  Lean pork is ok  Avoid shellfish but regular fish is ok   Don't eat fried food   I sent diabetes test equipment px to your pharmacy   Stop at check out to schedule a nurse visit to learn how to check your blood sugar (bring your equipment)   When you start checking glucose the goals are  120s or less in morning before eatin  140s or less 2 hours after eating   Check your glucose once daily (ever other day in the am and every other day 2 hours after a later meal)   Write it down in a log and make appointment with me after 2 weeks after starting and bring machine and log

## 2022-04-01 NOTE — Assessment & Plan Note (Signed)
bp in fair control at this time  BP Readings from Last 1 Encounters:  04/01/22 135/70   No changes needed Most recent labs reviewed  Disc lifstyle change with low sodium diet and exercise  Plan to continue amlodipine 5 mg daily

## 2022-04-01 NOTE — Progress Notes (Signed)
Subjective:    Patient ID: [name], female    DOB: [birth year], 81 y.o.   MRN: [id number]  HPI Pt presents for f/u of DM2 and chronic medical problems, to discuss medication   Wt Readings from Last 3 Encounters:  04/01/22 199 lb 6 oz (90.4 kg)  02/03/22 200 lb (90.7 kg)  01/23/22 200 lb 8 oz (90.9 kg)   31.23 kg/m   Last visit was referred for diabetic education  Working on eating better   She had held back on potato and potato salad  Holding off mac and cheese   Less bread  Has cut back on sweets  Occ fig cakes  Some ice cream   Exercise is difficult with pain  Walks inside the house  Has a floor pedaler  Has a step/goes up and and down   Not very thirsty   Hamburgers and hot dogs and red meat   Eats chicken  Eats fish    HTN  No cp or palpitations or headaches or edema  No side effects to medicines  BP Readings from Last 3 Encounters:  04/01/22 135/70  01/23/22 135/80  11/19/21 136/78    Amlodipine 5 mg daily   Lab Results  Component Value Date   CREATININE 0.82 01/17/2022   BUN 10 01/17/2022   NA 138 01/17/2022   K 3.9 01/17/2022   CL 104 01/17/2022   CO2 28 01/17/2022    DM2 Lab Results  Component Value Date   HGBA1C 6.8 (H) 01/17/2022   Chronic prednisone for RA  Last visit started metformin XR 500 mg daily - gave her a rash so she had to stop it   Is    Lab Results  Component Value Date   CHOL 137 01/17/2022   HDL 46.00 01/17/2022   LDLCALC 64 01/17/2022   LDLDIRECT 60.0 10/26/2018   TRIG 136.0 01/17/2022   CHOLHDL 3 01/17/2022   Atoravastatin 5 mg every other day   Patient Active Problem List   Diagnosis Date Noted   Current use of proton pump inhibitor 10/01/2021   General weakness 10/01/2021   Poor balance 10/01/2021   On prednisone therapy 10/01/2021   Controlled type 2 diabetes mellitus without complication, without long-term current use of insulin ([place]) 11/13/2020   Hyperlipidemia associated with type 2  diabetes mellitus ([place]) 10/15/2020   Pre-op exam 06/01/2020   Fatigue 01/30/2020   TMJ (dislocation of temporomandibular joint) 04/29/2019   Encounter for screening mammogram for breast cancer 10/27/2017   Routine general medical examination at a health care facility 10/27/2017   Generalized abdominal pain 03/31/2017   Aortic atherosclerosis ([place]) 02/03/2017   RA (rheumatoid arthritis) ([place])    Adjustment disorder with mixed anxiety and depressed mood 06/24/2016   Essential hypertension 01/07/2016   Coronary atherosclerosis 11/07/2015   Osteoarthritis 05/10/2014   GERD 07/09/2007   Past Medical History:  Diagnosis Date   Allergic rhinitis    Cataract 2015   bilateral   COVID-19 10/2019   ASYMPTOMATIC   Diabetes mellitus without complication ([place])    Diverticulosis    Esophageal reflux    Essential hypertension 01/07/2016   Fatty liver 2018   Fibula fracture 02/2005   Former smoker    Gastritis 2003   Heart murmur    MILD, NO CARDIOLOGIST   Hemorrhoids    History of cardiac dysrhythmia 2011   ABLATION  AT [facility]   History of kidney stones YRS AGO   Hyperglycemia    Hyperlipemia  Osteoarthritis    Personal history of colonic polyps 1998   hyperplastic   PMB (postmenopausal bleeding)    Pre-diabetes    BORDERLINE DIET CONTROLLED DOES NOT CHECK CBG   RA (rheumatoid arthritis) ([place])    Rectal bleed 2018   TRANSFUSION GIVEN   Shingles 2012   Uterine fibroid    Past Surgical History:  Procedure Laterality Date   CHOLECYSTECTOMY  YRS AGO   COLONOSCOPY  11/08   internal hemorrhoids   DILATATION & CURETTAGE/HYSTEROSCOPY WITH MYOSURE N/A 07/06/2020   Procedure: DILATATION & CURETTAGE/HYSTEROSCOPY;  Surgeon: Joseph Pierini, MD;  Location: [facility];  Service: Gynecology;  Laterality: N/A;  request 7:30am OR time in [place] block requests 30 minutes   EP procedure  1994   SVT; normal echo   ESOPHAGOGASTRODUODENOSCOPY  11/03   reactive  gastropathy   EYE SURGERY Bilateral 2017   CATARACTS   LIPOMA EXCISION  11/2002   SVT s/p surgery--? ablation  2011   AT [place] AGO   vaginal polyp excised  12/2000   benign   Social History   Tobacco Use   Smoking status: Former    Packs/day: 0.75    Years: 15.00    Total pack years: 11.25    Types: Cigarettes    Quit date: 10/21/2003    Years since quitting: 18.4   Smokeless tobacco: Never  Vaping Use   Vaping Use: Never used  Substance Use Topics   Alcohol use: No    Alcohol/week: 0.0 standard drinks of alcohol   Drug use: No   Family History  Problem Relation Age of Onset   Colon cancer Father 14   Diabetes Mother    Heart disease Mother    Kidney disease Mother        ESRD   Stroke Mother    Hypertension Brother    Breast cancer Sister 39   Diabetes Other        nephews   Colon cancer Sister    Esophageal cancer Neg Hx    Stomach cancer Neg Hx    Rectal cancer Neg Hx    Allergies  Allergen Reactions   Bee Venom Anaphylaxis   Dicyclomine     Blurry vision   Humira [Adalimumab]     LIPS SWELLED AND RASH   Metformin And Related     rash   Current Outpatient Medications on File Prior to Visit  Medication Sig Dispense Refill   acetaminophen (TYLENOL) 325 MG tablet Take 650 mg by mouth every 6 (six) hours as needed.     albuterol (PROVENTIL HFA;VENTOLIN HFA) 108 (90 Base) MCG/ACT inhaler Inhale 2 puffs into the lungs every 4 (four) hours as needed for wheezing or shortness of breath. Please give spacer if possible 1 Inhaler 3   amLODipine (NORVASC) 5 MG tablet TAKE 1 TABLET BY MOUTH EVERY DAY AT NOON 90 tablet 0   atorvastatin (LIPITOR) 10 MG tablet TAKE 1/2 TABLET BY MOUTH EVERY OTHER DAY 45 tablet 1   dicyclomine (BENTYL) 10 MG/5ML solution Take 1-2 mLs (2-4 mg total) by mouth 4 (four) times daily -  before meals and at bedtime. Prev with blurry vision on higher dose but not an allergy and may be able to tolerate lower dose. 100 mL 3    docusate sodium (COLACE) 100 MG capsule Take 1 capsule (100 mg total) by mouth 2 (two) times daily. 60 capsule 0   EPINEPHrine (EPIPEN IJ) Inject as  directed as needed. Reported on 12/05/2015     fluticasone (FLONASE) 50 MCG/ACT nasal spray PLACE 2 SPRAYS INTO THE NOSE DAILY. 48 g 3   folic acid (FOLVITE) 1 MG tablet Take 1 mg by mouth daily.     loratadine (CLARITIN) 10 MG tablet Take 10 mg by mouth daily. MAY TAKE AT HS ALSO     metFORMIN (GLUCOPHAGE-XR) 500 MG 24 hr tablet Take 1 tablet (500 mg total) by mouth daily with breakfast. 90 tablet 1   methotrexate (RHEUMATREX) 2.5 MG tablet Take 15 mg by mouth once a week. Caution:Chemotherapy. Protect from light. ( Monday of each week )     pantoprazole (PROTONIX) 40 MG tablet TAKE 1 TABLET BY MOUTH EVERY DAY AT NOON 90 tablet 1   polyethylene glycol (MIRALAX / GLYCOLAX) packet Take 17 g by mouth daily.     predniSONE (DELTASONE) 5 MG tablet Take 5 mg by mouth daily.     No current facility-administered medications on file prior to visit.    Review of Systems  Constitutional:  Positive for fatigue. Negative for activity change, appetite change, fever and unexpected weight change.  HENT:  Negative for congestion, ear pain, rhinorrhea, sinus pressure and sore throat.   Eyes:  Negative for pain, redness and visual disturbance.  Respiratory:  Negative for cough, shortness of breath and wheezing.   Cardiovascular:  Negative for chest pain and palpitations.  Gastrointestinal:  Negative for abdominal pain, blood in stool, constipation and diarrhea.  Endocrine: Negative for polydipsia and polyuria.  Genitourinary:  Negative for dysuria, frequency and urgency.  Musculoskeletal:  Positive for arthralgias. Negative for back pain and myalgias.  Skin:  Negative for pallor and rash.  Allergic/Immunologic: Negative for environmental allergies.  Neurological:  Negative for dizziness, syncope and headaches.  Hematological:  Negative for adenopathy. Does not  bruise/bleed easily.  Psychiatric/Behavioral:  Negative for decreased concentration and dysphoric mood. The patient is not nervous/anxious.        Objective:   Physical Exam Constitutional:      General: She is not in acute distress.    Appearance: Normal appearance. She is well-developed. She is obese. She is not ill-appearing or diaphoretic.  HENT:     Head: Normocephalic and atraumatic.  Eyes:     Conjunctiva/sclera: Conjunctivae normal.     Pupils: Pupils are equal, round, and reactive to light.  Neck:     Thyroid: No thyromegaly.     Vascular: No carotid bruit or JVD.  Cardiovascular:     Rate and Rhythm: Normal rate and regular rhythm.     Heart sounds: Normal heart sounds.     No gallop.  Pulmonary:     Effort: Pulmonary effort is normal. No respiratory distress.     Breath sounds: Normal breath sounds. No wheezing or rales.  Abdominal:     General: There is no distension or abdominal bruit.     Palpations: Abdomen is soft.  Musculoskeletal:     Cervical back: Normal range of motion and neck supple.     Right lower leg: No edema.     Left lower leg: No edema.  Lymphadenopathy:     Cervical: No cervical adenopathy.  Skin:    General: Skin is warm and dry.     Coloration: Skin is not pale.     Findings: No rash.  Neurological:     Mental Status: She is alert.     Coordination: Coordination normal.     Deep Tendon Reflexes: Reflexes  are normal and symmetric. Reflexes normal.  Psychiatric:        Mood and Affect: Mood normal.           Assessment & Plan:   Problem List Items Addressed This Visit       Cardiovascular and Mediastinum   Essential hypertension    bp in fair control at this time  BP Readings from Last 1 Encounters:  04/01/22 135/70  No changes needed Most recent labs reviewed  Disc lifstyle change with low sodium diet and exercise  Plan to continue amlodipine 5 mg daily         Endocrine   Controlled type 2 diabetes mellitus without  complication, without long-term current use of insulin (HCC) - Primary    Lab Results  Component Value Date   HGBA1C 6.8 (H) 01/17/2022  Allergic to metformin xr Had a nutrition visit -some imp in diet  On prednisone long term  Px sent for glucose test supplies Then sched nurse visit for instruction  Then f/u with 2 wk of readings  Will consider medication once we are certain she can monitor and she needs it  Disc reasons to treat dm/ risk of end organ damage Disc imp of eye and foot care

## 2022-04-10 ENCOUNTER — Ambulatory Visit: Payer: Medicare Other

## 2022-04-10 NOTE — Progress Notes (Signed)
Pt come in today to learn how to use her meter to check her blood sugar. Pt was shown how to [place] meter and lancet device. Pt used the meter and lancet , she was able perform test on her self before leave the office. Pt understood how to use meter successfully.

## 2022-04-14 ENCOUNTER — Telehealth: Payer: Self-pay | Admitting: Family Medicine

## 2022-04-15 ENCOUNTER — Ambulatory Visit: Payer: Medicare Other | Admitting: Pharmacist

## 2022-04-15 DIAGNOSIS — I10 Essential (primary) hypertension: Secondary | ICD-10-CM

## 2022-04-15 DIAGNOSIS — E1169 Type 2 diabetes mellitus with other specified complication: Secondary | ICD-10-CM

## 2022-04-15 DIAGNOSIS — E119 Type 2 diabetes mellitus without complications: Secondary | ICD-10-CM

## 2022-04-15 NOTE — Telephone Encounter (Signed)
Good, I hope that will be helpful

## 2022-04-15 NOTE — Progress Notes (Signed)
Chronic Care Management Pharmacy Note  04/18/2022 Name:  [name] MRN:  [id number] DOB:  29-Aug-1941  Summary: CCM F/U visit -Pt in office to troubleshoot Accu-Chek Guide glucometer/lancing device, she is unable to get lancing device to work. Demonstrated steps on how to set up and trigger lancing device (insert lancet, press button on end to cock, press yellow button in center to trigger needle stick). Patient was able to load lancing device, stick finger and check sugar while in office today. BG was 151 (ate cereal 3 hrs ago)  Recommendations/Changes made from today's visit: -No changes  Plan: -[place] will call patient 1 month for DM update -Pharmacist follow up televisit scheduled for 3 months    Subjective: [name] is an 81 y.o. year old female who is a primary patient of Tower, Wynelle Fanny, MD.  The CCM team was consulted for assistance with disease management and care coordination needs.    Engaged with patient by telephone for follow up visit in response to provider referral for pharmacy case management and/or care coordination services.   Consent to Services:  The patient was given information about Chronic Care Management services, agreed to services, and gave verbal consent prior to initiation of services.  Please see initial visit note for detailed documentation.   Patient Care Team: Tower, Wynelle Fanny, MD as PCP - General Fay Records, MD as Consulting Physician (Cardiology) Ladene Artist, MD as Consulting Physician (Gastroenterology) Lorelee Cover., MD as Consulting Physician (Ophthalmology) Charlton Haws, [facility] as Pharmacist (Pharmacist)  Recent office visits: 04/01/22 Dr Glori Bickers OV: f/u- check BG daily. Holding metformin due to rash.  Recent consult visits: [facility] visits: None in previous 6 months   Objective:  Lab Results  Component Value Date   CREATININE 0.82 01/17/2022   BUN 10 01/17/2022   GFR 67.90 01/17/2022    GFRNONAA >60 01/26/2017   GFRAA >60 01/26/2017   NA 138 01/17/2022   K 3.9 01/17/2022   CALCIUM 9.0 01/17/2022   CO2 28 01/17/2022   GLUCOSE 92 01/17/2022    Lab Results  Component Value Date/Time   HGBA1C 6.8 (H) 01/17/2022 10:32 AM   HGBA1C 6.6 (H) 10/01/2021 10:35 AM   GFR 67.90 01/17/2022 10:32 AM   GFR 58.44 (L) 11/19/2021 02:55 PM   MICROALBUR 0.9 10/29/2018 11:05 AM   MICROALBUR 2.6 (H) 10/23/2017 10:48 AM    Last diabetic Eye exam: No results found for: "HMDIABEYEEXA"  Last diabetic Foot exam: No results found for: "HMDIABFOOTEX"   Lab Results  Component Value Date   CHOL 137 01/17/2022   HDL 46.00 01/17/2022   LDLCALC 64 01/17/2022   LDLDIRECT 60.0 10/26/2018   TRIG 136.0 01/17/2022   CHOLHDL 3 01/17/2022       Latest Ref Rng & Units 01/17/2022   10:32 AM 11/19/2021    2:55 PM 10/01/2021   10:35 AM  Hepatic Function  Total Protein 6.0 - 8.3 g/dL 7.4  8.2  6.9   Albumin 3.5 - 5.2 g/dL 3.9  4.0  3.6   AST 0 - 37 U/L '17  24  12   ' ALT 0 - 35 U/L '6  10  5   ' Alk Phosphatase 39 - 117 U/L 57  58  59   Total Bilirubin 0.2 - 1.2 mg/dL 0.4  0.6  0.5   Bilirubin, Direct 0.0 - 0.3 mg/dL  0.2      Lab Results  Component Value Date/Time  TSH 2.36 10/01/2021 10:35 AM   TSH 0.96 11/13/2020 12:09 PM   FREET4 0.85 10/01/2021 10:35 AM   FREET4 0.77 04/27/2014 10:02 AM       Latest Ref Rng & Units 01/17/2022   10:32 AM 11/19/2021    2:55 PM 10/01/2021   10:35 AM  CBC  WBC 4.0 - 10.5 K/uL 3.2  5.0  5.1   Hemoglobin 12.0 - 15.0 g/dL 12.9  14.2  12.9   Hematocrit 36.0 - 46.0 % 38.4  43.3  38.9   Platelets 150.0 - 400.0 K/uL 256.0  340.0  257.0     No results found for: "VD25OH"  Clinical ASCVD: Yes  The 10-year ASCVD risk score (Arnett DK, et al., 2019) is: 27.7%   Values used to calculate the score:     Age: 81 years     Sex: Female     Is Non-Hispanic African American: Yes     Diabetic: Yes     Tobacco smoker: No     Systolic Blood Pressure: 035 mmHg      Is BP treated: Yes     HDL Cholesterol: 46 mg/dL     Total Cholesterol: 137 mg/dL       02/03/2022    4:09 PM 11/11/2021    2:51 PM 11/09/2020    2:48 PM  Depression screen PHQ 2/9  Decreased Interest 0 0 0  Down, Depressed, Hopeless 1 0 0  PHQ - 2 Score 1 0 0  Altered sleeping   0  Tired, decreased energy   0  Change in appetite   0  Feeling bad or failure about yourself    0  Trouble concentrating   0  Moving slowly or fidgety/restless   0  Suicidal thoughts   0  PHQ-9 Score   0  Difficult doing work/chores   Not difficult at all     Social History   Tobacco Use  Smoking Status Former   Packs/day: 0.75   Years: 15.00   Total pack years: 11.25   Types: Cigarettes   Quit date: 10/21/2003   Years since quitting: 18.5  Smokeless Tobacco Never   BP Readings from Last 3 Encounters:  04/01/22 135/70  01/23/22 135/80  11/19/21 136/78   Pulse Readings from Last 3 Encounters:  04/01/22 77  01/23/22 81  11/19/21 79   Wt Readings from Last 3 Encounters:  04/01/22 199 lb 6 oz (90.4 kg)  02/03/22 200 lb (90.7 kg)  01/23/22 200 lb 8 oz (90.9 kg)   BMI Readings from Last 3 Encounters:  04/01/22 31.23 kg/m  02/03/22 31.32 kg/m  01/23/22 31.40 kg/m    Assessment/Interventions: Review of patient past medical history, allergies, medications, health status, including review of consultants reports, laboratory and other test data, was performed as part of comprehensive evaluation and provision of chronic care management services.   SDOH:  (Social Determinants of Health) assessments and interventions performed: No - done Jan 2023  SDOH Screenings   Alcohol Screen: Low Risk  (11/11/2021)   Alcohol Screen    Last Alcohol Screening Score (AUDIT): 0  Depression (PHQ2-9): Low Risk  (02/03/2022)   Depression (PHQ2-9)    PHQ-2 Score: 1  Financial Resource Strain: Low Risk  (11/11/2021)   Overall Financial Resource Strain (CARDIA)    Difficulty of Paying Living Expenses: Not hard  at all  Food Insecurity: No Food Insecurity (11/11/2021)   Hunger Vital Sign    Worried About Running Out of Food in the  Last Year: Never true    Ran Out of Food in the Last Year: Never true  Housing: Low Risk  (11/11/2021)   Housing    Last Housing Risk Score: 0  Physical Activity: Insufficiently Active (11/11/2021)   Exercise Vital Sign    Days of Exercise per Week: 2 days    Minutes of Exercise per Session: 40 min  Social Connections: Moderately Isolated (11/11/2021)   Social Connection and Isolation Panel [NHANES]    Frequency of Communication with Friends and Family: More than three times a week    Frequency of Social Gatherings with Friends and Family: More than three times a week    Attends Religious Services: Never    Marine scientist or Organizations: No    Attends Archivist Meetings: Never    Marital Status: Married  Stress: No Stress Concern Present (11/11/2021)   [employer] [place]    Feeling of Stress : Only a little  Tobacco Use: Medium Risk (04/01/2022)   Patient History    Smoking Tobacco Use: Former    Smokeless Tobacco Use: Never    Passive Exposure: Not on file  Transportation Needs: No Transportation Needs (11/11/2021)   PRAPARE - Hydrologist (Medical): No    Lack of Transportation (Non-Medical): No    CCM Care Plan  Allergies  Allergen Reactions   Bee Venom Anaphylaxis   Dicyclomine     Blurry vision   Humira [Adalimumab]     LIPS SWELLED AND RASH   Metformin And Related     rash    Medications Reviewed Today     Reviewed by Charlton Haws, [facility] (Pharmacist) on 04/18/22 at 1330  Med List Status: <None>   Medication Order Taking? Sig Documenting Provider Last Dose Status Informant  acetaminophen (TYLENOL) 325 MG tablet [id number] Yes Take 650 mg by mouth every 6 (six) hours as needed. [provider] Taking Active Self  albuterol  (PROVENTIL HFA;VENTOLIN HFA) 108 (90 Base) MCG/ACT inhaler [id number] Yes Inhale 2 puffs into the lungs every 4 (four) hours as needed for wheezing or shortness of breath. Please give spacer if possible Tower, Wynelle Fanny, MD Taking Active Self  amLODipine (NORVASC) 5 MG tablet [id number] Yes TAKE 1 TABLET BY MOUTH EVERY DAY AT [facility], Wynelle Fanny, MD Taking Active   atorvastatin (LIPITOR) 10 MG tablet [id number] Yes TAKE 1/2 TABLET BY MOUTH EVERY OTHER DAY Tower, Wynelle Fanny, MD Taking Active   blood glucose meter kit and supplies [id number] Yes To check glucose daily and prn for DM2 E11.9 Tower, Wynelle Fanny, MD Taking Active   dicyclomine (BENTYL) 10 MG/5ML solution [id number] Yes Take 1-2 mLs (2-4 mg total) by mouth 4 (four) times daily -  before meals and at bedtime. Prev with blurry vision on higher dose but not an allergy and may be able to tolerate lower dose. Tower, Wynelle Fanny, MD Taking Active   docusate sodium (COLACE) 100 MG capsule [id number] Yes Take 1 capsule (100 mg total) by mouth 2 (two) times daily. Bonnielee Haff, MD Taking Active Self  EPINEPHrine [facility] IJ) [id number] Yes Inject as directed as needed. Reported on 12/05/2015 [provider] Taking Active Self  fluticasone (FLONASE) 50 MCG/ACT nasal spray [id number] Yes PLACE 2 SPRAYS INTO THE NOSE DAILY. Tower, Wynelle Fanny, MD Taking Active   folic acid (FOLVITE) 1 MG tablet [id number] Yes Take 1 mg by mouth daily. [provider] Taking Active  Self  loratadine (CLARITIN) 10 MG tablet [id number] Yes Take 10 mg by mouth daily. MAY TAKE AT [facility] ALSO [provider] Taking Active Self  methotrexate (RHEUMATREX) 2.5 MG tablet [id number] Yes Take 15 mg by mouth once a week. Caution:Chemotherapy. Protect from light. ( Monday of each week ) [provider] Taking Active Self  pantoprazole (PROTONIX) 40 MG tablet [id number] Yes TAKE 1 TABLET BY MOUTH EVERY DAY AT [facility], Wynelle Fanny, MD Taking Active   polyethylene glycol [facility] /  GLYCOLAX) packet [id number] Yes Take 17 g by mouth daily. [provider] Taking Active Self  predniSONE (DELTASONE) 5 MG tablet [id number] Yes Take 5 mg by mouth daily. [provider] Taking Active Self            Patient Active Problem List   Diagnosis Date Noted   Current use of proton pump inhibitor 10/01/2021   General weakness 10/01/2021   Poor balance 10/01/2021   On prednisone therapy 10/01/2021   Controlled type 2 diabetes mellitus without complication, without long-term current use of insulin ([place]) 11/13/2020   Hyperlipidemia associated with type 2 diabetes mellitus ([place]) 10/15/2020   Pre-op exam 06/01/2020   Fatigue 01/30/2020   TMJ (dislocation of temporomandibular joint) 04/29/2019   Encounter for screening mammogram for breast cancer 10/27/2017   Routine general medical examination at a health care facility 10/27/2017   Generalized abdominal pain 03/31/2017   Aortic atherosclerosis ([place]) 02/03/2017   RA (rheumatoid arthritis) ([place])    Adjustment disorder with mixed anxiety and depressed mood 06/24/2016   Essential hypertension 01/07/2016   Coronary atherosclerosis 11/07/2015   Osteoarthritis 05/10/2014   GERD 07/09/2007    Immunization History  Administered Date(s) Administered   Fluad Quad(high Dose 65+) 08/14/2020   Influenza,inj,Quad PF,6+ Mos 08/10/2015, 08/28/2016, 10/27/2017, 07/27/2018   PFIZER(Purple Top)SARS-COV-2 Vaccination 12/18/2019, 01/17/2020, 10/18/2020, 04/29/2021   Pneumococcal Conjugate-13 01/03/2016   Pneumococcal Polysaccharide-23 04/24/2014   Td 09/04/2006    Conditions to be addressed/monitored:  Hypertension, Hyperlipidemia, and Diabetes  Care Plan : [place]  Updates made by Charlton Haws, [place] since 04/18/2022 12:00 AM     Problem: Hypertension, Hyperlipidemia, and Diabetes   Priority: High     Long-Range Goal: Disease mgmt   Start Date: 04/18/2022  Expected End Date: 04/19/2023  This  Visit's Progress: On track  Priority: High  Note:   Current Barriers:  Unable to independently monitor therapeutic efficacy  Pharmacist Clinical Goal(s):  Patient will achieve adherence to monitoring guidelines and medication adherence to achieve therapeutic efficacy through collaboration with PharmD and provider.   Interventions: 1:1 collaboration with Tower, Wynelle Fanny, MD regarding development and update of comprehensive plan of care as evidenced by provider attestation and co-signature Inter-disciplinary care team collaboration (see longitudinal plan of care) Comprehensive medication review performed; medication list updated in electronic medical record  Hypertension (BP goal <140/90) -Controlled - BP at goal in recent OV -Current treatment: Amlodipine 5 mg daily 12n - Appropriate, Effective, Safe, Accessible -Medications previously tried: n/a  -Educated on BP goals and benefits of medications for prevention of heart attack, stroke and kidney damage; -Counseled to monitor BP at home periodically -Recommend to continue current medication  Hyperlipidemia: (LDL goal < 70) -Controlled - LDL 64 (12/2021) at goal -Hx aortic atherosclerosis -Current treatment: Atorvastatin 10 mg daily - Appropriate, Effective, Safe, Accessible -Medications previously tried: n/a  -Educated on Cholesterol goals; Benefits of statin for ASCVD risk reduction; -Recommended to continue current medication  Diabetes (A1c goal <  7%) -Query controlled - A1c 6.8% (12/2021); pt stopped metformin recently due to rash -Current home glucose readings - unable to check due to trouble with lancing device fasting glucose: n/a post prandial glucose: n/a -Denies hypoglycemic/hyperglycemic symptoms -Current medications: Metformin XR 500 mg daily AM - stopped due to rash Accu Chek supplies -Medications previously tried: metformin  -Educated on A1c and blood sugar goals;Benefits of routine self-monitoring of blood  sugar; -Reviewed how to set up lancing device and successfully lance finger; reviewed how to use test strips in her meter; pt successfully checking BG in office today - reading 151 -Counseled to check BG once daily - alternate times between fasting and bedtime  Patient Goals/Self-Care Activities Patient will:  - take medications as prescribed as evidenced by patient report and record review focus on medication adherence by routine check glucose once daily, document, and provide at future appointments       Medication Assistance: None required.  Patient affirms current coverage meets needs.  Compliance/Adherence/Medication fill history: Care Gaps: Urine MA (due 10/30/19) Eye exam (due 11/20/20) Mammogram (ordered 10/2021)  Star-Rating Drugs: Atorvastatin - PDC 74%; LF 04/02/22 x 88 ds  Medication Access: Within the past 30 days, how often has patient missed a dose of medication? 0 Is a pillbox or other method used to improve adherence? Yes  Factors that may affect medication adherence? no barriers identified Are meds synced by current pharmacy? No  Are meds delivered by current pharmacy? No  Does patient experience delays in picking up medications due to transportation concerns? No   [facility] Services Reviewed: Is patient disadvantaged to use [facility]?: No  Current Rx insurance plan: [facility] Name and location of Current pharmacy:  [id number] [place] [id number]: [phone]: [phone] [facility] services reviewed with patient today?: No  Patient requests to transfer care to [facility]?: No  Reason patient declined to change pharmacies: Not mentioned at this visit   Care Plan and Follow Up Patient Decision:  Patient agrees to Care Plan and Follow-up.  Plan: Telephone follow up appointment with care management team member scheduled for:  3 months  LCharlene Brooke PharmD,  BCACP Clinical Pharmacist [phone]

## 2022-04-17 ENCOUNTER — Telehealth: Payer: Self-pay

## 2022-04-17 NOTE — Progress Notes (Signed)
    Chronic Care Management Pharmacy Assistant   Name: MACY POLIO  MRN: [id number] DOB: [birth year]  Reason for Encounter: CCM (Blood Glucose Log)   Called patient to inform her to please check blood glucose daily; alternating times and keep a log per Dr. Glori Bickers. Follow up with 2 weeks worth of readings. Advised patient we would call back on 07/14 for her log.  Patient expressed understanding and agreed.    Charlene Brooke, CPP notified  Marijean Niemann, [place] Clinical Pharmacy Assistant [phone]

## 2022-04-18 NOTE — Patient Instructions (Signed)
Visit Information  Phone number for Pharmacist: [phone]   Goals Addressed   None     Care Plan : [place]  Updates made by Charlton Haws, [place] since 04/18/2022 12:00 AM     Problem: Hypertension, Hyperlipidemia, and Diabetes   Priority: High     Long-Range Goal: Disease mgmt   Start Date: 04/18/2022  Expected End Date: 04/19/2023  This Visit's Progress: On track  Priority: High  Note:   Current Barriers:  Unable to independently monitor therapeutic efficacy  Pharmacist Clinical Goal(s):  Patient will achieve adherence to monitoring guidelines and medication adherence to achieve therapeutic efficacy through collaboration with PharmD and provider.   Interventions: 1:1 collaboration with Tower, Wynelle Fanny, MD regarding development and update of comprehensive plan of care as evidenced by provider attestation and co-signature Inter-disciplinary care team collaboration (see longitudinal plan of care) Comprehensive medication review performed; medication list updated in electronic medical record  Hypertension (BP goal <140/90) -Controlled - BP at goal in recent OV -Current treatment: Amlodipine 5 mg daily 12n - Appropriate, Effective, Safe, Accessible -Medications previously tried: n/a  -Educated on BP goals and benefits of medications for prevention of heart attack, stroke and kidney damage; -Counseled to monitor BP at home periodically -Recommend to continue current medication  Hyperlipidemia: (LDL goal < 70) -Controlled - LDL 64 (12/2021) at goal -Hx aortic atherosclerosis -Current treatment: Atorvastatin 10 mg daily - Appropriate, Effective, Safe, Accessible -Medications previously tried: n/a  -Educated on Cholesterol goals; Benefits of statin for ASCVD risk reduction; -Recommended to continue current medication  Diabetes (A1c goal <7%) -Query controlled - A1c 6.8% (12/2021); pt stopped metformin recently due to rash -Current home glucose readings -  unable to check due to trouble with lancing device fasting glucose: n/a post prandial glucose: n/a -Denies hypoglycemic/hyperglycemic symptoms -Current medications: Metformin XR 500 mg daily AM - stopped due to rash Accu Chek supplies -Medications previously tried: metformin  -Educated on A1c and blood sugar goals;Benefits of routine self-monitoring of blood sugar; -Reviewed how to set up lancing device and successfully lance finger; reviewed how to use test strips in her meter; pt successfully checking BG in office today - reading 151 -Counseled to check BG once daily - alternate times between fasting and bedtime  Patient Goals/Self-Care Activities Patient will:  - take medications as prescribed as evidenced by patient report and record review focus on medication adherence by routine check glucose once daily, document, and provide at future appointments       The patient verbalized understanding of instructions, educational materials, and care plan provided today and DECLINED offer to receive copy of patient instructions, educational materials, and care plan.  Telephone follow up appointment with pharmacy team member scheduled for: 3 months  Charlene Brooke, PharmD, [facility] Clinical Pharmacist [facility] [phone]

## 2022-04-28 ENCOUNTER — Telehealth: Payer: Self-pay | Admitting: Family Medicine

## 2022-04-28 ENCOUNTER — Encounter: Payer: Medicare Other | Attending: Family Medicine | Admitting: Dietician

## 2022-04-28 ENCOUNTER — Telehealth: Payer: Self-pay

## 2022-04-28 DIAGNOSIS — Z713 Dietary counseling and surveillance: Secondary | ICD-10-CM | POA: Diagnosis not present

## 2022-04-28 DIAGNOSIS — E119 Type 2 diabetes mellitus without complications: Secondary | ICD-10-CM | POA: Insufficient documentation

## 2022-04-28 MED ORDER — ACCU-CHEK SOFTCLIX LANCETS MISC: 3 refills | Status: DC

## 2022-04-28 NOTE — Telephone Encounter (Signed)
No, she is allergic to it (had a rash) Is she checking her glucose?   How are her readings?

## 2022-04-28 NOTE — Telephone Encounter (Signed)
Patient notified as instructed by telephone and verbalized understanding. Patient stated that she will call back in about two week with glucose readings.

## 2022-04-28 NOTE — Telephone Encounter (Signed)
Excellent!  I did just send in some lancets.   Ask her to start checking some glucose readings 2 hours after lunch or dinner on different days as well

## 2022-04-28 NOTE — Progress Notes (Signed)
Diabetes Self-Management Education  Visit Type: Follow-up  Appt. Start Time: 1400 Appt. End Time: 1430  04/28/2022  [name], identified by name and date of birth, is a 81 y.o. female with a diagnosis of Diabetes:  .   ASSESSMENT Appointment today is a phone visit.  Patient is home and I am in my office.  Patient states that she is doing well. She is walking most days.  Goal is 15 minutes per day but she is walking until her feet hurt.  She was instructed to walk inside due to the pollen.  Discussed for her to walk as she is able several times per day to build up to the 15 minutes total per day.  She is checking her blood glucose which is 97-120 fasting and after meals. Water intake 64 oz daily Weight at last MD visit 199 lbs 04/01/2022  History includes Type 2 diabetes, rheumatoid arthritis Medications include prednisone, folic acid  Labs noted to include:  A1C 6.8% 3/312023 increased from 6.6% 10/01/2021 Vitamin B-12 224 10/01/2021.  She states that she is starting to have symptoms of neuropathy.  199 lbs 03/2022 stable from 200 lbs 01/2022.   Patient lives with her husband and grandson. They share shopping and cooking. 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 Unable to exercise, balance issues, leg pain, arthritis.    Diabetes Self-Management Education - 04/28/22 1723       Visit Information   Visit Type Follow-up      Psychosocial Assessment   Patient Belief/Attitude about Diabetes Motivated to manage diabetes    Other persons present Patient    Patient Concerns Nutrition/Meal planning    Special Needs None    Preferred Learning Style No preference indicated      Pre-Education Assessment   Patient understands the diabetes disease and treatment process. Comprehends key points    Patient understands incorporating nutritional management into lifestyle. Needs Review    Patient undertands incorporating physical activity into  lifestyle. Comprehends key points    Patient understands using medications safely. Comprehends key points    Patient understands monitoring blood glucose, interpreting and using results Comprehends key points    Patient understands prevention, detection, and treatment of acute complications. Comprehends key points    Patient understands prevention, detection, and treatment of chronic complications. Compreheands key points    Patient understands how to develop strategies to address psychosocial issues. Comprehends key points    Patient understands how to develop strategies to promote health/change behavior. Comprehends key points      Complications   How often do you check your blood sugar? 1-2 times/day    Fasting Blood glucose range (mg/dL) 70-129    Postprandial Blood glucose range (mg/dL) 70-129      Dietary Intake   Breakfast egg, [place] bacon or sausage, 1 slice bread OR [place] spam (fried), 1 slice bread, egg   brunch   Dinner salad, rotisourie chicken OR shrimp and pasta    Beverage(s) water, rare regular sprite, occasional sweet tea, coffee with splenda      Activity / Exercise   Activity / Exercise Type Light (walking / raking leaves)    How many days per week do you exercise? 7    How many minutes per day do you exercise? 10    Total minutes per week of exercise 70      Patient Education   Previous Diabetes Education Yes (please comment)  01/2022   Healthy Eating Meal options for control of blood glucose level and chronic complications.    Being Active Helped patient identify appropriate exercises in relation to his/her diabetes, diabetes complications and other health issue.    Medications Reviewed patients medication for diabetes, action, purpose, timing of dose and side effects.    Diabetes Stress and Support Worked with patient to identify barriers to care and solutions      Individualized Goals (developed by patient)   Nutrition General guidelines for healthy choices  and portions discussed    Physical Activity Exercise 5-7 days per week;15 minutes per day    Problem Solving Eating Pattern      Patient Self-Evaluation of Goals - Patient rates self as meeting previously set goals (% of time)   Nutrition 50 - 75 % (half of the time)    Physical Activity 25 - 50% (sometimes)    Medications Not Applicable    Monitoring 50 - 75 % (half of the time)    Problem Solving and behavior change strategies  50 - 75 % (half of the time)    Reducing Risk (treating acute and chronic complications) 50 - 75 % (half of the time)    Health Coping 50 - 75 % (half of the time)      Post-Education Assessment   Patient understands the diabetes disease and treatment process. Demonstrates understanding / competency    Patient understands incorporating nutritional management into lifestyle. Comprehends key points    Patient undertands incorporating physical activity into lifestyle. Comprehends key points    Patient understands using medications safely. Demonstrates understanding / competency    Patient understands monitoring blood glucose, interpreting and using results Comprehends key points    Patient understands prevention, detection, and treatment of acute complications. Demonstrates understanding / competency    Patient understands prevention, detection, and treatment of chronic complications. Demonstrates understanding / competency    Patient understands how to develop strategies to address psychosocial issues. Demonstrates understanding / competency    Patient understands how to develop strategies to promote health/change behavior. Comprehends key points      Outcomes   Expected Outcomes Demonstrated interest in learning. Expect positive outcomes    Future DMSE 2 months;PRN    Program Status Not Completed      Subsequent Visit   Since your last visit have you experienced any weight changes? Loss    Weight Loss (lbs) 1    Since your last visit, are you checking your  blood glucose at least once a day? Yes             Individualized Plan for Diabetes Self-Management Training:   Learning Objective:  Patient will have a greater understanding of diabetes self-management. Patient education plan is to attend individual and/or group sessions per assessed needs and concerns.   Plan:   Patient Instructions  Recommend a Vitamin B-12 (sublingual or dissolvable).  Your vitamin B-12 lab was low. Walk 5 minutes at a time and slowly build up to 15 minutes daily.  Walk several times per day currently until you are able to walk 15 minutes at one time.  Walk indoors per your MD instruction.  Continue to be mindful of your eating choices. Bake rather than fry most often Choose whole grains and plenty of non starchy vegetables, small portion of lean protein (chicken, fish, lean meat, beans, egg, cheese, yogurt) Continue to stay hydrated. Continue to check your blood glucose periodically  Expected Outcomes:  Demonstrated interest  in learning. Expect positive outcomes  Education material provided:   If problems or questions, patient to contact team via:  Phone  Future DSME appointment: 2 months, PRN

## 2022-04-28 NOTE — Telephone Encounter (Addendum)
Patient notified as instructed by telephone and verbalized understanding. Patient stated that she does not know why the pharmacy filled them. Patient was advised to contact her pharmacy and let them know that she is no longer taking Metformin because of a rash. Patient stated that she has been checking her glucose and it has been 105,120, 97, and 104. Patient stated that these readings have been fasting.

## 2022-04-28 NOTE — Telephone Encounter (Signed)
-----   Message from Clydell Hakim, RD sent at 04/28/2022  2:04 PM EDT ----- Regarding: Prescription request Dr. Glori Bickers,  Patient needs a prescription for lancets for the soft clicks lancing devise (AccuChek meter). [facility] [address] [facility] [place].    Thanks, Mickel Baas

## 2022-04-28 NOTE — Patient Instructions (Addendum)
Recommend a Vitamin B-12 (sublingual or dissolvable).  Your vitamin B-12 lab was low. Walk 5 minutes at a time and slowly build up to 15 minutes daily.  Walk several times per day currently until you are able to walk 15 minutes at one time.  Walk indoors per your MD instruction.   Continue to be mindful of your eating choices. Bake rather than fry most often Choose whole grains and plenty of non starchy vegetables, small portion of lean protein (chicken, fish, lean meat, beans, egg, cheese, yogurt) Continue to stay hydrated. Continue to check your blood glucose periodically 

## 2022-04-28 NOTE — Telephone Encounter (Signed)
I sent the lancets

## 2022-04-28 NOTE — Telephone Encounter (Signed)
Patient is calling in stating that she found some pills and unsure if she should be taking them or not. Metformin HCL 500 MG.

## 2022-04-30 ENCOUNTER — Telehealth: Payer: Self-pay

## 2022-04-30 NOTE — Progress Notes (Signed)
Chronic Care Management Pharmacy Assistant   Name: [name]  MRN: [id number] DOB: Jun 08, 1941  Reason for Encounter: CCM (Diabetes Disease State)    Recent office visits:  04/28/22 Telephone: Pt unsure if she should be taking Metformin; Dr. Glori Bickers stated no as patient is allergic.   Recent consult visits:  04/28/22 Antonieta Iba, RD (Nutrition) DM Start: OTC Vitamin B Recommended: Walk 5 at a time to 15 min a day  Hospital visits:  None in previous 6 months  Medications: Outpatient Encounter Medications as of 04/30/2022  Medication Sig   Accu-Chek Softclix Lancets lancets Check glucose level daily and as needed for diabetes type 2  (lancets for the soft clicks lancing device AccuChek meter)   acetaminophen (TYLENOL) 325 MG tablet Take 650 mg by mouth every 6 (six) hours as needed.   albuterol (PROVENTIL HFA;VENTOLIN HFA) 108 (90 Base) MCG/ACT inhaler Inhale 2 puffs into the lungs every 4 (four) hours as needed for wheezing or shortness of breath. Please give spacer if possible   amLODipine (NORVASC) 5 MG tablet TAKE 1 TABLET BY MOUTH EVERY DAY AT NOON   atorvastatin (LIPITOR) 10 MG tablet TAKE 1/2 TABLET BY MOUTH EVERY OTHER DAY   blood glucose meter kit and supplies To check glucose daily and prn for DM2 E11.9   dicyclomine (BENTYL) 10 MG/5ML solution Take 1-2 mLs (2-4 mg total) by mouth 4 (four) times daily -  before meals and at bedtime. Prev with blurry vision on higher dose but not an allergy and may be able to tolerate lower dose.   docusate sodium (COLACE) 100 MG capsule Take 1 capsule (100 mg total) by mouth 2 (two) times daily.   EPINEPHrine (EPIPEN IJ) Inject as directed as needed. Reported on 12/05/2015   fluticasone (FLONASE) 50 MCG/ACT nasal spray PLACE 2 SPRAYS INTO THE NOSE DAILY.   folic acid (FOLVITE) 1 MG tablet Take 1 mg by mouth daily.   loratadine (CLARITIN) 10 MG tablet Take 10 mg by mouth daily. MAY TAKE AT HS ALSO   methotrexate (RHEUMATREX) 2.5 MG tablet  Take 15 mg by mouth once a week. Caution:Chemotherapy. Protect from light. ( Monday of each week )   pantoprazole (PROTONIX) 40 MG tablet TAKE 1 TABLET BY MOUTH EVERY DAY AT NOON   polyethylene glycol (MIRALAX / GLYCOLAX) packet Take 17 g by mouth daily.   predniSONE (DELTASONE) 5 MG tablet Take 5 mg by mouth daily.   No facility-administered encounter medications on file as of 04/30/2022.   Recent Relevant Labs: Lab Results  Component Value Date/Time   HGBA1C 6.8 (H) 01/17/2022 10:32 AM   HGBA1C 6.6 (H) 10/01/2021 10:35 AM   MICROALBUR 0.9 10/29/2018 11:05 AM   MICROALBUR 2.6 (H) 10/23/2017 10:48 AM    Kidney Function Lab Results  Component Value Date/Time   CREATININE 0.82 01/17/2022 10:32 AM   CREATININE 0.93 11/19/2021 02:55 PM   GFR 67.90 01/17/2022 10:32 AM   GFRNONAA >60 01/26/2017 04:59 AM   GFRAA >60 01/26/2017 04:59 AM    Contacted patient on 05/01/22 to discuss diabetes disease state.   Current antihyperglycemic regimen:  Metformin XR 500 mg daily AM - stopped due to rash - patient is allergic Accu Chek supplies   Patient verbally confirms she is taking the above medications as directed. Yes  What diet changes have been made to improve diabetes control? Patient is watching her sugar and her carb intake  What recent interventions/DTPs have been made to improve glycemic control:  Continue current medications Reviewed how to set up lancing device and successfully lance finger; reviewed how to use test strips in her meter Counseled to check BG once daily - alternate times between fasting and bedtime   Have there been any recent hospitalizations or ED visits since last visit with CPP? No  Patient denies hypoglycemic symptoms, including Pale, Sweaty, Shaky, Hungry, Nervous/irritable, and Vision changes  Patient denies hyperglycemic symptoms, including blurry vision, excessive thirst, fatigue, polyuria, and weakness  How often are you checking your blood sugar? Patient  did not have any needles to check her blood sugar. Patient stated her husband is picking up her needles today. I have asked patient to take their blood glucose daily and keep a log. Advised patient I would call back on 05/09/2022 for log. Patient verbalized understanding and agreed.   During the week, how often does your blood glucose drop below 70? Never  Are you checking your feet daily/regularly? Yes  Adherence Review: Is the patient currently on a STATIN medication? Yes Is the patient currently on ACE/ARB medication? No Does the patient have >5 day gap between last estimated fill dates? No  Care Gaps: Annual wellness visit in last year? Yes 11/11/21 Most recent A1C reading: 6.8 on 01/17/22 Most Recent BP reading: 135/70 on 04/01/22  Last eye exam / retinopathy screening: 11/21/2019 Last diabetic foot exam: Up to date  Star Rating Drugs:  Medication:  Last Fill: Day Supply Atorvastatin 10 mg 04/02/22 88  DM Nutrition appointment on 06/20/2022 CCM appointment on 07/09/2022  Charlene Brooke, CPP notified  Marijean Niemann, [place] Assistant [phone]

## 2022-05-02 NOTE — Progress Notes (Addendum)
Called patient for blood glucose log as previously discussed.   Date  Blood Glucose - fasting 07/20  101 07/19  115 07/18  101 07/17  127 07/16  102 07/15  116 07/14  117 07/13  [place], CPP notified  Marijean Niemann, [place] Clinical Pharmacy Assistant [phone]

## 2022-06-02 ENCOUNTER — Telehealth: Payer: Medicare Other

## 2022-06-13 DIAGNOSIS — Z0279 Encounter for issue of other medical certificate: Secondary | ICD-10-CM

## 2022-06-19 ENCOUNTER — Telehealth: Payer: Self-pay

## 2022-06-19 NOTE — Telephone Encounter (Signed)
Patient's daughter, Dede Dobesh, dropped off FMLA form for completion.  This has been completed and faxed to [phone] .  Received fax confirmation.  Copies were made for billing, scanning, patient's daughter, Lorenso Courier, and myself.  Sent note to Antoinette via mychart that form is ready at our front office for pickup.

## 2022-06-20 ENCOUNTER — Encounter: Payer: Medicare Other | Attending: Family Medicine | Admitting: Dietician

## 2022-06-20 DIAGNOSIS — E119 Type 2 diabetes mellitus without complications: Secondary | ICD-10-CM | POA: Insufficient documentation

## 2022-06-20 NOTE — Progress Notes (Signed)
    Diabetes Self-Management Education  Visit Type:    Appt. Start Time: 1410 Appt. End Time: 4401  06/20/2022  [name], identified by name and date of birth, is a 81 y.o. female with a diagnosis of Diabetes:  .   ASSESSMENT  This is a phone visit. Patient is at home and I am in my office. She was last seen by this RD on 04/28/2022.  She reports her fasting blood glucose is 94-107 and has decreased since last visit. She is drinking more water and has stopped soda and tea. She continues to take prednisone and has not started a vitamin B-12 supplement. She walks in the house but does not walk outside unless someone is with her due to fear of falling. She states that her family is supportive and strict with regards to her diet. She reports she has a difficult time eating 3 meals per day.  History includes Type 2 diabetes, rheumatoid arthritis Medications include prednisone, folic acid  Labs noted to include:  A1C 6.8% 3/312023 increased from 6.6% 10/01/2021 Vitamin B-12 224 10/01/2021.  She states that she is starting to have symptoms of neuropathy.   199 lbs 03/2022 stable from 200 lbs 01/2022.   Patient lives with her husband and grandson. They share shopping and cooking. 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 Unable to exercise, balance issues, leg pain, arthritis  Individualized Plan for Diabetes Self-Management Training:   Learning Objective:  Patient will have a greater understanding of diabetes self-management. Patient education plan is to attend individual and/or group sessions per assessed needs and concerns.   Plan:  This is a phone visit. Patient is at home and I am in my office. She was last seen by this RD on 04/28/2022.  She reports her fasting blood glucose is 94-107 and has decreased since last visit. She is drinking more water and has stopped soda and tea. She continues to take prednisone and has not started a vitamin B-12  supplement. She walks in the house but does not walk outside unless someone is with her due to fear of falling. She states that her family is supportive and strict with regards to her diet. She reports she has a difficult time eating 3 meals per day.  History includes Type 2 diabetes, rheumatoid arthritis Medications include prednisone, folic acid  Labs noted to include:  A1C 6.8% 3/312023 increased from 6.6% 10/01/2021 Vitamin B-12 224 10/01/2021.  She states that she is starting to have symptoms of neuropathy.   199 lbs 03/2022 stable from 200 lbs 01/2022.   Patient lives with her husband and grandson. They share shopping and cooking. 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 Unable to exercise, balance issues, leg pain, arthritisExpected Outcomes:     Education material provided:   If problems or questions, patient to contact team via:  Phone and Email  Future DSME appointment:

## 2022-06-20 NOTE — Patient Instructions (Signed)
Patient Instructions  Recommend a Vitamin B-12 (sublingual or dissolvable).  Your vitamin B-12 lab was low. Walk 5 minutes at a time and slowly build up to 15 minutes daily.  Walk several times per day currently until you are able to walk 15 minutes at one time.  Walk indoors per your MD instruction.   Continue to be mindful of your eating choices. Bake rather than fry most often Choose whole grains and plenty of non starchy vegetables, small portion of lean protein (chicken, fish, lean meat, beans, egg, cheese, yogurt) Continue to stay hydrated. Continue to check your blood glucose periodically

## 2022-07-04 ENCOUNTER — Telehealth: Payer: Self-pay

## 2022-07-04 NOTE — Progress Notes (Signed)
    Chronic Care Management Pharmacy Assistant   Name: [name]  MRN: [id number] DOB: 1940-12-11  Reason for Encounter: CCM (Appointment Reminder)  Medications: Outpatient Encounter Medications as of 07/04/2022  Medication Sig   Accu-Chek Softclix Lancets lancets Check glucose level daily and as needed for diabetes type 2  (lancets for the soft clicks lancing device AccuChek meter)   acetaminophen (TYLENOL) 325 MG tablet Take 650 mg by mouth every 6 (six) hours as needed.   albuterol (PROVENTIL HFA;VENTOLIN HFA) 108 (90 Base) MCG/ACT inhaler Inhale 2 puffs into the lungs every 4 (four) hours as needed for wheezing or shortness of breath. Please give spacer if possible   amLODipine (NORVASC) 5 MG tablet TAKE 1 TABLET BY MOUTH EVERY DAY AT NOON   atorvastatin (LIPITOR) 10 MG tablet TAKE 1/2 TABLET BY MOUTH EVERY OTHER DAY   blood glucose meter kit and supplies To check glucose daily and prn for DM2 E11.9   dicyclomine (BENTYL) 10 MG/5ML solution Take 1-2 mLs (2-4 mg total) by mouth 4 (four) times daily -  before meals and at bedtime. Prev with blurry vision on higher dose but not an allergy and may be able to tolerate lower dose.   docusate sodium (COLACE) 100 MG capsule Take 1 capsule (100 mg total) by mouth 2 (two) times daily.   EPINEPHrine (EPIPEN IJ) Inject as directed as needed. Reported on 12/05/2015   fluticasone (FLONASE) 50 MCG/ACT nasal spray PLACE 2 SPRAYS INTO THE NOSE DAILY.   folic acid (FOLVITE) 1 MG tablet Take 1 mg by mouth daily.   loratadine (CLARITIN) 10 MG tablet Take 10 mg by mouth daily. MAY TAKE AT HS ALSO   methotrexate (RHEUMATREX) 2.5 MG tablet Take 15 mg by mouth once a week. Caution:Chemotherapy. Protect from light. ( Monday of each week )   pantoprazole (PROTONIX) 40 MG tablet TAKE 1 TABLET BY MOUTH EVERY DAY AT NOON   polyethylene glycol (MIRALAX / GLYCOLAX) packet Take 17 g by mouth daily.   predniSONE (DELTASONE) 5 MG tablet Take 5 mg by mouth daily.    No facility-administered encounter medications on file as of 07/04/2022.   [name] was contacted to remind of upcoming telephone visit with Charlene Brooke on 07/09/2022 at 3:00. Patient was reminded to have any blood glucose and blood pressure readings available for review at appointment.   Message was left reminding patient of appointment.  CCM referral has been placed prior to visit?  No   Star Rating Drugs: Medication:  Last Fill: Day Supply Atorvastatin 10 mg 06/28/2022 [place], CPP notified  Marijean Niemann, [place] Pharmacy Assistant [phone]

## 2022-07-06 ENCOUNTER — Other Ambulatory Visit: Payer: Self-pay | Admitting: Family Medicine

## 2022-07-07 ENCOUNTER — Telehealth: Payer: Self-pay | Admitting: Family Medicine

## 2022-07-07 NOTE — Telephone Encounter (Signed)
  Encourage patient to contact the pharmacy for refills or they can request refills through [facility]  Did the patient contact the pharmacy: Yes  LAST APPOINTMENT DATE: 04/01/2022  NEXT APPOINTMENT DATE: N/A  MEDICATION: Accu-chek test strips  Is the patient out of medication? Yes  PHARMACY: [id number] [place], [place] - [address] Let patient know to contact pharmacy at the end of the day to make sure medication is ready.  Please notify patient to allow 48-72 hours to process

## 2022-07-07 NOTE — Telephone Encounter (Signed)
done

## 2022-07-07 NOTE — Progress Notes (Signed)
[facility] Quality Team Note  Name: [name] Date of Birth: 06/09/1941 MRN: [id number] Date: 07/07/2022  [facility] Quality Team has reviewed this patient's chart, please see recommendations below:  [facility] Quality Other; Pt has open quality gap for KED, needs Micro/Creat urine test to close this. Please address at next visit.

## 2022-07-09 ENCOUNTER — Ambulatory Visit: Payer: Medicare Other | Admitting: Pharmacist

## 2022-07-09 DIAGNOSIS — M069 Rheumatoid arthritis, unspecified: Secondary | ICD-10-CM

## 2022-07-09 DIAGNOSIS — E119 Type 2 diabetes mellitus without complications: Secondary | ICD-10-CM

## 2022-07-09 DIAGNOSIS — I10 Essential (primary) hypertension: Secondary | ICD-10-CM

## 2022-07-09 DIAGNOSIS — E1169 Type 2 diabetes mellitus with other specified complication: Secondary | ICD-10-CM

## 2022-07-09 NOTE — Patient Instructions (Signed)
Visit Information  Phone number for Pharmacist: [phone]   Goals Addressed   None     Care Plan : [place]  Updates made by Charlton Haws, RPH since 07/09/2022 12:00 AM     Problem: Hypertension, Hyperlipidemia, and Diabetes   Priority: High     Long-Range Goal: Disease mgmt   Start Date: 04/18/2022  Expected End Date: 04/19/2023  This Visit's Progress: On track  Recent Progress: On track  Priority: High  Note:   Current Barriers:  Does not adhere to prescribed medication regimen (not taking amlodipine consistently, not checking BP at home)  Pharmacist Clinical Goal(s):  Patient will adhere to prescribed medication regimen as evidenced by patient report  through collaboration with PharmD and provider.   Interventions: 1:1 collaboration with Tower, Wynelle Fanny, MD regarding development and update of comprehensive plan of care as evidenced by provider attestation and co-signature Inter-disciplinary care team collaboration (see longitudinal plan of care) Comprehensive medication review performed; medication list updated in electronic medical record  Hypertension (BP goal <140/90) -Controlled - BP at goal in recent OV, pt is not taking amlodipine consistently, only when she feels like BP is high, she is not checking BP very often and cannot recall any recent BP readings -Home BP: not checking -Current treatment: Amlodipine 5 mg daily - taking PRN - Query appropriate -Medications previously tried: n/a  -Educated on BP goals and benefits of medications for prevention of heart attack, stroke and kidney damage; -Recommend to take amlodipine daily and check BP daily until PCP visit 10/3  Hyperlipidemia: (LDL goal < 70) -Controlled - LDL 64 (12/2021) at goal -Hx aortic atherosclerosis -Current treatment: Atorvastatin 10 mg - 1/2 tab QOD - Appropriate, Effective, Safe, Accessible -Medications previously tried: n/a  -Educated on Cholesterol goals; Benefits of  statin for ASCVD risk reduction; -Recommended to continue current medication  Diabetes (A1c goal <7%) -Controlled - A1c 6.8% (12/2021);  -Current home glucose readings - unable to check due to trouble with lancing device fasting glucose: 92-107 post prandial glucose: n/a -Denies hypoglycemic/hyperglycemic symptoms -Current medications: Accu Chek supplies -Medications previously tried: metformin (rash) -Educated on A1c and blood sugar goals;Benefits of routine self-monitoring of blood sugar; -Counseled to check BG once daily; keep log for upcoming PCP appt  RA (Goal: mange symptoms) -Controlled -Current treatment  Prednisone 5 mg daily - Appropriate, Effective, Safe, Accessible Methotrexate 15 mg weekly -Appropriate, Effective, Safe, Accessible Tylenol 500 mg PRN -Appropriate, Effective, Safe, Accessible -Medications previously tried: n/a  -Discussed prednisone can increase BG and BP -Recommended to continue current medication  Patient Goals/Self-Care Activities Patient will:  - take medications as prescribed as evidenced by patient report and record review focus on medication adherence by routine check glucose daily, document, and provide at future appointments check blood pressure daily, document, and provide at future appointments      The patient verbalized understanding of instructions, educational materials, and care plan provided today and DECLINED offer to receive copy of patient instructions, educational materials, and care plan.  Telephone follow up appointment with pharmacy team member scheduled for: 6 months  Charlene Brooke, PharmD, [facility] Clinical Pharmacist [place] [facility] at [facility] [phone]

## 2022-07-09 NOTE — Progress Notes (Signed)
Chronic Care Management Pharmacy Note  07/09/2022 Name:  [name] MRN:  [id number] DOB:  1941-04-07  Summary: CCM F/U visit -DM: A1c 6.8% (12/2021), pt did not tolerate metformin (rash); fasting BG at home 90-110 -BP: pt is not taking amlodipine consistently; she is not checking BP at home, BP is at goal in office visits  Recommendations/Changes made from today's visit: -Advised to take amlodipine daily and keep daily BP log until upcoming PCP appt 10/3  Plan: -Datto will call patient 3 months for BP update -Pharmacist follow up televisit scheduled for 6 months -PCP appt 07/22/22    Subjective: [name] is an 81 y.o. year old female who is a primary patient of Tower, Wynelle Fanny, MD.  The CCM team was consulted for assistance with disease management and care coordination needs.    Engaged with patient by telephone for follow up visit in response to provider referral for pharmacy case management and/or care coordination services.   Consent to Services:  The patient was given information about Chronic Care Management services, agreed to services, and gave verbal consent prior to initiation of services.  Please see initial visit note for detailed documentation.   Patient Care Team: Tower, Wynelle Fanny, MD as PCP - General Fay Records, MD as Consulting Physician (Cardiology) Ladene Artist, MD as Consulting Physician (Gastroenterology) Lorelee Cover., MD as Consulting Physician (Ophthalmology) Charlton Haws, [facility] as Pharmacist (Pharmacist)  Recent office visits: 04/01/22 Dr Glori Bickers OV: f/u- check BG daily. Holding metformin due to rash.  Recent consult visits: [facility] visits: None in previous 6 months   Objective:  Lab Results  Component Value Date   CREATININE 0.82 01/17/2022   BUN 10 01/17/2022   GFR 67.90 01/17/2022   GFRNONAA >60 01/26/2017   GFRAA >60 01/26/2017   NA 138 01/17/2022   K 3.9 01/17/2022   CALCIUM 9.0 01/17/2022    CO2 28 01/17/2022   GLUCOSE 92 01/17/2022    Lab Results  Component Value Date/Time   HGBA1C 6.8 (H) 01/17/2022 10:32 AM   HGBA1C 6.6 (H) 10/01/2021 10:35 AM   GFR 67.90 01/17/2022 10:32 AM   GFR 58.44 (L) 11/19/2021 02:55 PM   MICROALBUR 0.9 10/29/2018 11:05 AM   MICROALBUR 2.6 (H) 10/23/2017 10:48 AM    Last diabetic Eye exam: No results found for: "HMDIABEYEEXA"  Last diabetic Foot exam: No results found for: "HMDIABFOOTEX"   Lab Results  Component Value Date   CHOL 137 01/17/2022   HDL 46.00 01/17/2022   LDLCALC 64 01/17/2022   LDLDIRECT 60.0 10/26/2018   TRIG 136.0 01/17/2022   CHOLHDL 3 01/17/2022       Latest Ref Rng & Units 01/17/2022   10:32 AM 11/19/2021    2:55 PM 10/01/2021   10:35 AM  Hepatic Function  Total Protein 6.0 - 8.3 g/dL 7.4  8.2  6.9   Albumin 3.5 - 5.2 g/dL 3.9  4.0  3.6   AST 0 - 37 U/L '17  24  12   ' ALT 0 - 35 U/L '6  10  5   ' Alk Phosphatase 39 - 117 U/L 57  58  59   Total Bilirubin 0.2 - 1.2 mg/dL 0.4  0.6  0.5   Bilirubin, Direct 0.0 - 0.3 mg/dL  0.2      Lab Results  Component Value Date/Time   TSH 2.36 10/01/2021 10:35 AM   TSH 0.96 11/13/2020 12:09 PM   FREET4 0.85 10/01/2021  10:35 AM   FREET4 0.77 04/27/2014 10:02 AM       Latest Ref Rng & Units 01/17/2022   10:32 AM 11/19/2021    2:55 PM 10/01/2021   10:35 AM  CBC  WBC 4.0 - 10.5 K/uL 3.2  5.0  5.1   Hemoglobin 12.0 - 15.0 g/dL 12.9  14.2  12.9   Hematocrit 36.0 - 46.0 % 38.4  43.3  38.9   Platelets 150.0 - 400.0 K/uL 256.0  340.0  257.0     No results found for: "VD25OH"  Clinical ASCVD: Yes  The ASCVD Risk score (Arnett DK, et al., 2019) failed to calculate for the following reasons:   The 2019 ASCVD risk score is only valid for ages 69 to 16       02/03/2022    4:09 PM 11/11/2021    2:51 PM 11/09/2020    2:48 PM  Depression screen PHQ 2/9  Decreased Interest 0 0 0  Down, Depressed, Hopeless 1 0 0  PHQ - 2 Score 1 0 0  Altered sleeping   0  Tired, decreased  energy   0  Change in appetite   0  Feeling bad or failure about yourself    0  Trouble concentrating   0  Moving slowly or fidgety/restless   0  Suicidal thoughts   0  PHQ-9 Score   0  Difficult doing work/chores   Not difficult at all     Social History   Tobacco Use  Smoking Status Former   Packs/day: 0.75   Years: 15.00   Total pack years: 11.25   Types: Cigarettes   Quit date: 10/21/2003   Years since quitting: 18.7  Smokeless Tobacco Never   BP Readings from Last 3 Encounters:  04/01/22 135/70  01/23/22 135/80  11/19/21 136/78   Pulse Readings from Last 3 Encounters:  04/01/22 77  01/23/22 81  11/19/21 79   Wt Readings from Last 3 Encounters:  04/01/22 199 lb 6 oz (90.4 kg)  02/03/22 200 lb (90.7 kg)  01/23/22 200 lb 8 oz (90.9 kg)   BMI Readings from Last 3 Encounters:  04/01/22 31.23 kg/m  02/03/22 31.32 kg/m  01/23/22 31.40 kg/m    Assessment/Interventions: Review of patient past medical history, allergies, medications, health status, including review of consultants reports, laboratory and other test data, was performed as part of comprehensive evaluation and provision of chronic care management services.   SDOH:  (Social Determinants of Health) assessments and interventions performed: No - done Jan 2023 SDOH Interventions    Flowsheet Row Chronic Care Management from 12/04/2020 in [place] at [place] from 11/09/2020 in [place] at [place] from 01/30/2020 in [place] at [place] from 10/23/2017 in [place] at [place]  SDOH Interventions      Depression Interventions/Treatment  -- AJO8-7 Score <4 Follow-up Not Indicated PHQ2-9 Score <4 Follow-up Not Indicated --  [referral to PCP]  Financial Strain Interventions Intervention Not Indicated  [Medications affordable] -- -- --      SDOH Screenings   Food Insecurity: No Food Insecurity (11/11/2021)   Housing: Low Risk  (11/11/2021)  Transportation Needs: No Transportation Needs (11/11/2021)  Alcohol Screen: Low Risk  (11/11/2021)  Depression (PHQ2-9): Low Risk  (02/03/2022)  Financial Resource Strain: Low Risk  (11/11/2021)  Physical Activity: Insufficiently Active (11/11/2021)  Social Connections: Moderately Isolated (11/11/2021)  Stress: No Stress Concern Present (11/11/2021)  Tobacco Use: Medium Risk (04/01/2022)  CCM Care Plan  Allergies  Allergen Reactions   Bee Venom Anaphylaxis   Dicyclomine     Blurry vision   Humira [Adalimumab]     LIPS SWELLED AND RASH   Metformin And Related     rash    Medications Reviewed Today     Reviewed by Charlton Haws, [employer] (Pharmacist) on 04/18/22 at 1330  Med List Status: <None>   Medication Order Taking? Sig Documenting Provider Last Dose Status Informant  acetaminophen (TYLENOL) 325 MG tablet [id number] Yes Take 650 mg by mouth every 6 (six) hours as needed. [provider] Taking Active Self  albuterol (PROVENTIL HFA;VENTOLIN HFA) 108 (90 Base) MCG/ACT inhaler [id number] Yes Inhale 2 puffs into the lungs every 4 (four) hours as needed for wheezing or shortness of breath. Please give spacer if possible Tower, Wynelle Fanny, MD Taking Active Self  amLODipine (NORVASC) 5 MG tablet [id number] Yes TAKE 1 TABLET BY MOUTH EVERY DAY AT [facility], Wynelle Fanny, MD Taking Active   atorvastatin (LIPITOR) 10 MG tablet [id number] Yes TAKE 1/2 TABLET BY MOUTH EVERY OTHER DAY Tower, Wynelle Fanny, MD Taking Active   blood glucose meter kit and supplies [id number] Yes To check glucose daily and prn for DM2 E11.9 Tower, Wynelle Fanny, MD Taking Active   dicyclomine (BENTYL) 10 MG/5ML solution [id number] Yes Take 1-2 mLs (2-4 mg total) by mouth 4 (four) times daily -  before meals and at bedtime. Prev with blurry vision on higher dose but not an allergy and may be able to tolerate lower dose. Tower, Wynelle Fanny, MD Taking Active   docusate sodium (COLACE) 100 MG capsule  [id number] Yes Take 1 capsule (100 mg total) by mouth 2 (two) times daily. Bonnielee Haff, MD Taking Active Self  EPINEPHrine [facility] IJ) [id number] Yes Inject as directed as needed. Reported on 12/05/2015 [provider] Taking Active Self  fluticasone (FLONASE) 50 MCG/ACT nasal spray [id number] Yes PLACE 2 SPRAYS INTO THE NOSE DAILY. Tower, Wynelle Fanny, MD Taking Active   folic acid (FOLVITE) 1 MG tablet [id number] Yes Take 1 mg by mouth daily. [provider] Taking Active Self  loratadine (CLARITIN) 10 MG tablet [id number] Yes Take 10 mg by mouth daily. MAY TAKE AT [facility] ALSO [provider] Taking Active Self  methotrexate (RHEUMATREX) 2.5 MG tablet [id number] Yes Take 15 mg by mouth once a week. Caution:Chemotherapy. Protect from light. ( Monday of each week ) [provider] Taking Active Self  pantoprazole (PROTONIX) 40 MG tablet [id number] Yes TAKE 1 TABLET BY MOUTH EVERY DAY AT [facility], Wynelle Fanny, MD Taking Active   polyethylene glycol [facility] / GLYCOLAX) packet [id number] Yes Take 17 g by mouth daily. [provider] Taking Active Self  predniSONE (DELTASONE) 5 MG tablet [id number] Yes Take 5 mg by mouth daily. 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   Encounter for screening mammogram for breast cancer 10/27/2017   Routine general medical examination at a health care facility 10/27/2017  Generalized abdominal pain 03/31/2017   Aortic atherosclerosis  (HCC) 02/03/2017   RA (rheumatoid arthritis) (HCC)    Adjustment disorder with mixed anxiety and depressed mood 06/24/2016   Essential hypertension 01/07/2016   Coronary atherosclerosis 11/07/2015   Osteoarthritis 05/10/2014   GERD 07/09/2007    Immunization History  Administered Date(s) Administered   Fluad Quad(high Dose 65+) 08/14/2020   Influenza,inj,Quad PF,6+ Mos 08/10/2015, 08/28/2016, 10/27/2017, 07/27/2018   PFIZER(Purple Top)SARS-COV-2 Vaccination 12/18/2019, 01/17/2020, 10/18/2020, 04/29/2021   Pneumococcal Conjugate-13 01/03/2016   Pneumococcal Polysaccharide-23 04/24/2014   Td 09/04/2006    Conditions to be addressed/monitored:  Hypertension, Hyperlipidemia, and Diabetes  Care Plan : [place]  Updates made by Charlton Haws, [place] since 07/09/2022 12:00 AM     Problem: Hypertension, Hyperlipidemia, and Diabetes   Priority: High     Long-Range Goal: Disease mgmt   Start Date: 04/18/2022  Expected End Date: 04/19/2023  This Visit's Progress: On track  Recent Progress: On track  Priority: High  Note:   Current Barriers:  Does not adhere to prescribed medication regimen (not taking amlodipine consistently, not checking BP at home)  Pharmacist Clinical Goal(s):  Patient will adhere to prescribed medication regimen as evidenced by patient report  through collaboration with PharmD and provider.   Interventions: 1:1 collaboration with Tower, Wynelle Fanny, MD regarding development and update of comprehensive plan of care as evidenced by provider attestation and co-signature Inter-disciplinary care team collaboration (see longitudinal plan of care) Comprehensive medication review performed; medication list updated in electronic medical record  Hypertension (BP goal <140/90) -Controlled - BP at goal in recent OV, pt is not taking amlodipine consistently, only when she feels like BP is high, she is not checking BP very often and cannot recall any recent BP  readings -Home BP: not checking -Current treatment: Amlodipine 5 mg daily - taking PRN - Query appropriate -Medications previously tried: n/a  -Educated on BP goals and benefits of medications for prevention of heart attack, stroke and kidney damage; -Recommend to take amlodipine daily and check BP daily until PCP visit 10/3  Hyperlipidemia: (LDL goal < 70) -Controlled - LDL 64 (12/2021) at goal -Hx aortic atherosclerosis -Current treatment: Atorvastatin 10 mg - 1/2 tab QOD - Appropriate, Effective, Safe, Accessible -Medications previously tried: n/a  -Educated on Cholesterol goals; Benefits of statin for ASCVD risk reduction; -Recommended to continue current medication  Diabetes (A1c goal <7%) -Controlled - A1c 6.8% (12/2021);  -Current home glucose readings - unable to check due to trouble with lancing device fasting glucose: 92-107 post prandial glucose: n/a -Denies hypoglycemic/hyperglycemic symptoms -Current medications: Accu Chek supplies -Medications previously tried: metformin (rash) -Educated on A1c and blood sugar goals;Benefits of routine self-monitoring of blood sugar; -Counseled to check BG once daily; keep log for upcoming PCP appt  RA (Goal: mange symptoms) -Controlled -Current treatment  Prednisone 5 mg daily - Appropriate, Effective, Safe, Accessible Methotrexate 15 mg weekly -Appropriate, Effective, Safe, Accessible Tylenol 500 mg PRN -Appropriate, Effective, Safe, Accessible -Medications previously tried: n/a  -Discussed prednisone can increase BG and BP -Recommended to continue current medication  Patient Goals/Self-Care Activities Patient will:  - take medications as prescribed as evidenced by patient report and record review focus on medication adherence by routine check glucose daily, document, and provide at future appointments check blood pressure daily, document, and provide at future appointments      Medication Assistance: None required.   Patient affirms current coverage meets needs.  Compliance/Adherence/Medication fill history: Care Gaps: Urine MA (  due 10/30/19) Eye exam (due 11/20/20) Mammogram (ordered 10/2021)  Star-Rating Drugs: Atorvastatin - PDC 77%; LF 06/28/22 x 88 ds  Medication Access: Within the past 30 days, how often has patient missed a dose of medication? 0 Is a pillbox or other method used to improve adherence? Yes  Factors that may affect medication adherence? no barriers identified Are meds synced by current pharmacy? No  Are meds delivered by current pharmacy? No  Does patient experience delays in picking up medications due to transportation concerns? No   [facility] Services Reviewed: Is patient disadvantaged to use [facility]?: No  Current Rx insurance plan: [facility] [place] Name and location of Current pharmacy:  [id number] [place] [id number]: [phone]: [phone] [facility] services reviewed with patient today?: No  Patient requests to transfer care to [facility]?: No  Reason patient declined to change pharmacies: Not mentioned at this visit   Care Plan and Follow Up Patient Decision:  Patient agrees to Care Plan and Follow-up.  Plan: Telephone follow up appointment with care management team member scheduled for:  6 months  LCharlene Brooke PharmD, BCACP Clinical Pharmacist [phone]

## 2022-07-16 ENCOUNTER — Other Ambulatory Visit: Payer: Self-pay | Admitting: Family Medicine

## 2022-07-22 ENCOUNTER — Ambulatory Visit (INDEPENDENT_AMBULATORY_CARE_PROVIDER_SITE_OTHER): Payer: Medicare Other | Admitting: Family Medicine

## 2022-07-22 ENCOUNTER — Encounter: Payer: Self-pay | Admitting: Family Medicine

## 2022-07-22 VITALS — BP 160/70 | HR 53 | Temp 97.2°F | Ht 67.0 in | Wt 206.0 lb

## 2022-07-22 DIAGNOSIS — Z7409 Other reduced mobility: Secondary | ICD-10-CM | POA: Diagnosis not present

## 2022-07-22 DIAGNOSIS — M069 Rheumatoid arthritis, unspecified: Secondary | ICD-10-CM | POA: Diagnosis not present

## 2022-07-22 DIAGNOSIS — E119 Type 2 diabetes mellitus without complications: Secondary | ICD-10-CM | POA: Diagnosis not present

## 2022-07-22 DIAGNOSIS — E785 Hyperlipidemia, unspecified: Secondary | ICD-10-CM | POA: Diagnosis not present

## 2022-07-22 DIAGNOSIS — E1169 Type 2 diabetes mellitus with other specified complication: Secondary | ICD-10-CM

## 2022-07-22 DIAGNOSIS — Z23 Encounter for immunization: Secondary | ICD-10-CM | POA: Diagnosis not present

## 2022-07-22 DIAGNOSIS — I10 Essential (primary) hypertension: Secondary | ICD-10-CM

## 2022-07-22 LAB — POCT GLYCOSYLATED HEMOGLOBIN (HGB A1C): Hemoglobin A1C: 6.6 % — AB (ref 4.0–5.6)

## 2022-07-22 NOTE — Assessment & Plan Note (Signed)
Pt continues to struggle with mobility impairment  Able to tolerate mtx but no other biologic meds so far  Continues prednisone 5 mg daily  Encouraged f/u with rheumatology   [name] does not offer enough support for leg/hip pain  She can grip walker fairly and is less likely to fall with it  Px for 4 wheeled walker given to take to med supply

## 2022-07-22 NOTE — Assessment & Plan Note (Signed)
No glycemic medicines currently , was allergic to metformin   Lab Results  Component Value Date   HGBA1C 6.6 (A) 07/22/2022   slt improved with diet  Will continue to monitor and do nutrition counseling  Stressed imp of low glycemic diet and wt loss  On a statin  Will check urine microalb next time if she can get a sample  Sent for eye exam report from Dr Gloriann Loan (per pt w/in a year)  Nl foot exam  For now-plans to continue low dose prednisone for RA

## 2022-07-22 NOTE — Assessment & Plan Note (Signed)
Worse lately with increased arthritis pain in knees, hips and back  Not doing well with cane  Px for walker given today

## 2022-07-22 NOTE — Assessment & Plan Note (Signed)
bp in poor control with sub optimal medicine compliance  BP Readings from Last 1 Encounters:  07/22/22 (!) 160/70   Stressed importance of daily dosing of amlodipine 5 mg daily  Most recent labs reviewed  Disc lifstyle change with low sodium diet and exercise  Plan f/u in 1 month when she has taken her medicine

## 2022-07-22 NOTE — Progress Notes (Signed)
Subjective:    Patient ID: [name], female    DOB: 09-26-1941, 81 y.o.   MRN: [id number]  HPI Pt presents for f/u of chronic medical problems including DM2 and HTN Also RA Needs px for a walker   Wt Readings from Last 3 Encounters:  07/22/22 206 lb (93.4 kg)  04/01/22 199 lb 6 oz (90.4 kg)  02/03/22 200 lb (90.7 kg)   32.26 kg/m  Struggling with arthritis  A lot of pain  Cannot take any more medicine than she is  On mtx  Prednisone  Per pt afraid to push it further due to heart issues ? (Afraid of side eff)  Last echo 2017   Tylenol helps her sleep   HTN bp is stable today  No cp or palpitations or headaches or edema  No side effects to medicines  BP Readings from Last 3 Encounters:  07/22/22 (!) 160/70  04/01/22 135/70  01/23/22 135/80     Amlodipine 5 mg daily - was not taking every day because she did not think she needed it  Now with pain bp is up   Did not take it today   Pulse Readings from Last 3 Encounters:  07/22/22 (!) 41  04/01/22 77  01/23/22 81     Lab Results  Component Value Date   CREATININE 0.82 01/17/2022   BUN 10 01/17/2022   NA 138 01/17/2022   K 3.9 01/17/2022   CL 104 01/17/2022   CO2 28 01/17/2022    DM2 Lab Results  Component Value Date   HGBA1C 6.8 (H) 01/17/2022   Allergic to metformin , no glucose med currently  Did nutrition counseling - she is trying to follow adv   Avoid fried food  Eats soup and she cooks  Doing better with starches/carbs - is cutting back on that esp bread  Tries to get protein - milk , eggs  Not a lot of meat (grilled)  Some fish  Does not like pb  Some nuts -not a lot  No beans    Takes prednisone 5 mg daily  Eye exam : within the year    Flu shot : today  Had her covid shot   Glucose readings- usually in the 90s to low 100s   Takes atorvastatin 5 mg every other day (does not tolerate more)  Due for microalb  Lab Results  Component Value Date   MICROALBUR 0.9  10/29/2018   MICROALBUR 2.6 (H) 10/23/2017   Today  Needs a walker  Usually uses cane on R  Now needs more support   Legs/ankles hurt as well as her back  Knees are the biggest problem  Poor balance but no recent falls  Cannot get up from seat w/o using arms   Is able to grip walker with hands   Hyperlipidemia Lab Results  Component Value Date   CHOL 137 01/17/2022   HDL 46.00 01/17/2022   LDLCALC 64 01/17/2022   LDLDIRECT 60.0 10/26/2018   TRIG 136.0 01/17/2022   CHOLHDL 3 01/17/2022   Atorvastatin 5 mg every other day   Patient Active Problem List   Diagnosis Date Noted   Mobility impaired 07/22/2022   Current use of proton pump inhibitor 10/01/2021   General weakness 10/01/2021   Poor balance 10/01/2021   On prednisone therapy 10/01/2021   Controlled type 2 diabetes mellitus without complication, without long-term current use of insulin ([place]) 11/13/2020   Hyperlipidemia associated with type 2 diabetes mellitus ([place]) 10/15/2020  Pre-op exam 06/01/2020   Fatigue 01/30/2020   TMJ (dislocation of temporomandibular joint) 04/29/2019   Encounter for screening mammogram for breast cancer 10/27/2017   Routine general medical examination at a health care facility 10/27/2017   Generalized abdominal pain 03/31/2017   Aortic atherosclerosis ([place]) 02/03/2017   RA (rheumatoid arthritis) ([place])    Adjustment disorder with mixed anxiety and depressed mood 06/24/2016   Essential hypertension 01/07/2016   Coronary atherosclerosis 11/07/2015   Osteoarthritis 05/10/2014   GERD 07/09/2007   Past Medical History:  Diagnosis Date   Allergic rhinitis    Cataract 2015   bilateral   COVID-19 10/2019   ASYMPTOMATIC   Diabetes mellitus without complication ([place])    Diverticulosis    Esophageal reflux    Essential hypertension 01/07/2016   Fatty liver 2018   Fibula fracture 02/2005   Former smoker    Gastritis 2003   Heart murmur    MILD, NO CARDIOLOGIST   Hemorrhoids     History of cardiac dysrhythmia 2011   ABLATION  AT [facility]   History of kidney stones YRS AGO   Hyperglycemia    Hyperlipemia    Osteoarthritis    Personal history of colonic polyps 1998   hyperplastic   PMB (postmenopausal bleeding)    Pre-diabetes    BORDERLINE DIET CONTROLLED DOES NOT CHECK CBG   RA (rheumatoid arthritis) ([place])    Rectal bleed 2018   TRANSFUSION GIVEN   Shingles 2012   Uterine fibroid    Past Surgical History:  Procedure Laterality Date   CHOLECYSTECTOMY  YRS AGO   COLONOSCOPY  11/08   internal hemorrhoids   DILATATION & CURETTAGE/HYSTEROSCOPY WITH MYOSURE N/A 07/06/2020   Procedure: DILATATION & CURETTAGE/HYSTEROSCOPY;  Surgeon: Joseph Pierini, MD;  Location: [facility];  Service: Gynecology;  Laterality: N/A;  request 7:30am OR time in [place] block requests 30 minutes   EP procedure  1994   SVT; normal echo   ESOPHAGOGASTRODUODENOSCOPY  11/03   reactive gastropathy   EYE SURGERY Bilateral 2017   CATARACTS   LIPOMA EXCISION  11/2002   SVT s/p surgery--? ablation  2011   AT [place] AGO   vaginal polyp excised  12/2000   benign   Social History   Tobacco Use   Smoking status: Former    Packs/day: 0.75    Years: 15.00    Total pack years: 11.25    Types: Cigarettes    Quit date: 10/21/2003    Years since quitting: 18.7   Smokeless tobacco: Never  Vaping Use   Vaping Use: Never used  Substance Use Topics   Alcohol use: No    Alcohol/week: 0.0 standard drinks of alcohol   Drug use: No   Family History  Problem Relation Age of Onset   Colon cancer Father 60   Diabetes Mother    Heart disease Mother    Kidney disease Mother        ESRD   Stroke Mother    Hypertension Brother    Breast cancer Sister 41   Diabetes Other        nephews   Colon cancer Sister    Esophageal cancer Neg Hx    Stomach cancer Neg Hx    Rectal cancer Neg Hx    Allergies  Allergen Reactions   Bee Venom Anaphylaxis    Dicyclomine     Blurry vision   Humira [Adalimumab]     LIPS SWELLED AND  RASH   Metformin And Related     rash   Current Outpatient Medications on File Prior to Visit  Medication Sig Dispense Refill   Accu-Chek Softclix Lancets lancets Check glucose level daily and as needed for diabetes type 2  (lancets for the soft clicks lancing device AccuChek meter) 100 each 3   acetaminophen (TYLENOL) 325 MG tablet Take 650 mg by mouth every 6 (six) hours as needed.     amLODipine (NORVASC) 5 MG tablet TAKE 1 TABLET BY MOUTH EVERY DAY AT NOON (Patient taking differently: PRN) 90 tablet 0   atorvastatin (LIPITOR) 10 MG tablet TAKE 1/2 TABLET BY MOUTH EVERY OTHER DAY 45 tablet 1   blood glucose meter kit and supplies To check glucose daily and prn for DM2 E11.9 1 each 0   dicyclomine (BENTYL) 10 MG/5ML solution Take 1-2 mLs (2-4 mg total) by mouth 4 (four) times daily -  before meals and at bedtime. Prev with blurry vision on higher dose but not an allergy and may be able to tolerate lower dose. 100 mL 3   docusate sodium (COLACE) 100 MG capsule Take 1 capsule (100 mg total) by mouth 2 (two) times daily. 60 capsule 0   EPINEPHrine (EPIPEN IJ) Inject as directed as needed. Reported on 12/05/2015     fluticasone (FLONASE) 50 MCG/ACT nasal spray PLACE 2 SPRAYS INTO THE NOSE DAILY. 48 g 3   folic acid (FOLVITE) 1 MG tablet Take 1 mg by mouth daily.     glucose blood (ACCU-CHEK GUIDE) test strip To check glucose daily for DM2 E11.9 100 strip 1   loratadine (CLARITIN) 10 MG tablet Take 10 mg by mouth daily. MAY TAKE AT HS ALSO     methotrexate (RHEUMATREX) 2.5 MG tablet Take 15 mg by mouth once a week. Caution:Chemotherapy. Protect from light. ( Tuesday of each week )     pantoprazole (PROTONIX) 40 MG tablet TAKE 1 TABLET BY MOUTH EVERY DAY AT NOON 90 tablet 0   polyethylene glycol (MIRALAX / GLYCOLAX) packet Take 17 g by mouth daily.     predniSONE (DELTASONE) 5 MG tablet Take 5 mg by mouth daily.      albuterol (PROVENTIL HFA;VENTOLIN HFA) 108 (90 Base) MCG/ACT inhaler Inhale 2 puffs into the lungs every 4 (four) hours as needed for wheezing or shortness of breath. Please give spacer if possible (Patient not taking: Reported on 07/22/2022) 1 Inhaler 3   No current facility-administered medications on file prior to visit.     Review of Systems  Constitutional:  Positive for fatigue. Negative for activity change, appetite change, fever and unexpected weight change.  HENT:  Negative for congestion, ear pain, rhinorrhea, sinus pressure and sore throat.   Eyes:  Negative for pain, redness and visual disturbance.  Respiratory:  Negative for cough, shortness of breath and wheezing.   Cardiovascular:  Negative for chest pain and palpitations.  Gastrointestinal:  Negative for abdominal pain, blood in stool, constipation and diarrhea.  Endocrine: Negative for polydipsia and polyuria.  Genitourinary:  Negative for dysuria, frequency and urgency.  Musculoskeletal:  Positive for arthralgias and back pain. Negative for myalgias.  Skin:  Negative for pallor and rash.  Allergic/Immunologic: Negative for environmental allergies.  Neurological:  Negative for dizziness, syncope and headaches.       Poor balance   Hematological:  Negative for adenopathy. Does not bruise/bleed easily.  Psychiatric/Behavioral:  Negative for decreased concentration and dysphoric mood. The patient is not nervous/anxious.  Objective:   Physical Exam Constitutional:      General: She is not in acute distress.    Appearance: Normal appearance. She is well-developed. She is obese. She is not ill-appearing or diaphoretic.  HENT:     Head: Normocephalic and atraumatic.     Mouth/Throat:     Mouth: Mucous membranes are moist.  Eyes:     General: No scleral icterus.    Conjunctiva/sclera: Conjunctivae normal.     Pupils: Pupils are equal, round, and reactive to light.  Neck:     Thyroid: No thyromegaly.     Vascular:  No carotid bruit or JVD.  Cardiovascular:     Rate and Rhythm: Regular rhythm. Bradycardia present.     Pulses: Normal pulses.     Heart sounds: Normal heart sounds.     No gallop.  Pulmonary:     Effort: Pulmonary effort is normal. No respiratory distress.     Breath sounds: Normal breath sounds. No wheezing or rales.  Abdominal:     General: There is no distension or abdominal bruit.     Palpations: Abdomen is soft.     Tenderness: There is no abdominal tenderness.  Musculoskeletal:     Cervical back: Normal range of motion and neck supple.     Right lower leg: No edema.     Left lower leg: No edema.     Comments: Changes of OA in MCP joints but grip is not impaired   Pt cannot rise from chair with help or arms or assist from another person  Reduced rom of hips and knees due to pain /arthritis   Reduced rom of LS   Gait is slow and unsteady with cane alone     Lymphadenopathy:     Cervical: No cervical adenopathy.  Skin:    General: Skin is warm and dry.     Coloration: Skin is not pale.     Findings: No rash.  Neurological:     Mental Status: She is alert.     Cranial Nerves: No cranial nerve deficit.     Sensory: No sensory deficit.     Coordination: Coordination normal.     Gait: Gait abnormal.     Deep Tendon Reflexes: Reflexes are normal and symmetric. Reflexes normal.  Psychiatric:        Mood and Affect: Mood normal.           Assessment & Plan:   Problem List Items Addressed This Visit       Cardiovascular and Mediastinum   Essential hypertension    bp in poor control with sub optimal medicine compliance  BP Readings from Last 1 Encounters:  07/22/22 (!) 160/70  Stressed importance of daily dosing of amlodipine 5 mg daily  Most recent labs reviewed  Disc lifstyle change with low sodium diet and exercise  Plan f/u in 1 month when she has taken her medicine         Endocrine   Controlled type 2 diabetes mellitus without complication, without  long-term current use of insulin (HCC) - Primary    No glycemic medicines currently , was allergic to metformin   Lab Results  Component Value Date   HGBA1C 6.6 (A) 07/22/2022   slt improved with diet  Will continue to monitor and do nutrition counseling  Stressed imp of low glycemic diet and wt loss  On a statin  Will check urine microalb next time if she can get a sample  Sent for  eye exam report from Dr Gloriann Loan (per pt w/in a year)  Nl foot exam  For now-plans to continue low dose prednisone for RA       Relevant Orders   POCT glycosylated hemoglobin (Hb A1C) (Completed)   Hyperlipidemia associated with type 2 diabetes mellitus (HCC)    Disc goals for lipids and reasons to control them Rev last labs with pt Rev low sat fat diet in detail LDL at goal (64) last check Plan to continue atorvastatin 5 mg daily         Musculoskeletal and Integument   RA (rheumatoid arthritis) ([place])    Pt continues to struggle with mobility impairment  Able to tolerate mtx but no other biologic meds so far  Continues prednisone 5 mg daily  Encouraged f/u with rheumatology   Kasandra Knudsen does not offer enough support for leg/hip pain  She can grip walker fairly and is less likely to fall with it  Px for 4 wheeled walker given to take to med supply       Relevant Orders   For home use only DME 4 wheeled rolling walker with seat ([id number])     Other   Mobility impaired    Worse lately with increased arthritis pain in knees, hips and back  Not doing well with cane  Px for walker given today      Relevant Orders   For home use only DME 4 wheeled rolling walker with seat ([id number])   Other Visit Diagnoses     Need for immunization against influenza       Relevant Orders   Flu Vaccine QUAD High Dose(Fluad) (Completed)

## 2022-07-22 NOTE — Assessment & Plan Note (Signed)
Disc goals for lipids and reasons to control them Rev last labs with pt Rev low sat fat diet in detail LDL at goal (64) last check Plan to continue atorvastatin 5 mg daily

## 2022-07-22 NOTE — Patient Instructions (Addendum)
Start checking glucose at different times Some in am and some later in the day 2 hours after a meal  A1c is a little improved so keep work on diet (and exercise as tolerated)   Try to get most of your carbohydrates from produce (with the exception of white potatoes)  Eat less bread/pasta/rice/snack foods/cereals/sweets and other items from the middle of the grocery store (processed carbs)  Dried beans are a good protein source (kidney beans , pinto beans, black beans, garbanzo beans (hummus)   Take the px for a walker to your medical supply place   Flu shot today   Take your blood pressure medicine every day! Follow up here in about a month for blood pressure   If you are interested in the new shingles vaccine (Shingrix) - call your local pharmacy to check on coverage and availability  If affordable, get on a wait list at your pharmacy to get the vaccine.

## 2022-08-04 DIAGNOSIS — M5136 Other intervertebral disc degeneration, lumbar region: Secondary | ICD-10-CM | POA: Diagnosis not present

## 2022-08-04 DIAGNOSIS — M0579 Rheumatoid arthritis with rheumatoid factor of multiple sites without organ or systems involvement: Secondary | ICD-10-CM | POA: Diagnosis not present

## 2022-08-04 DIAGNOSIS — M1991 Primary osteoarthritis, unspecified site: Secondary | ICD-10-CM | POA: Diagnosis not present

## 2022-08-27 ENCOUNTER — Encounter: Payer: Self-pay | Admitting: Family Medicine

## 2022-08-27 ENCOUNTER — Ambulatory Visit (INDEPENDENT_AMBULATORY_CARE_PROVIDER_SITE_OTHER): Payer: Medicare Other | Admitting: Family Medicine

## 2022-08-27 VITALS — BP 150/78 | HR 59 | Temp 97.6°F | Ht 67.0 in | Wt 200.0 lb

## 2022-08-27 DIAGNOSIS — I10 Essential (primary) hypertension: Secondary | ICD-10-CM | POA: Diagnosis not present

## 2022-08-27 DIAGNOSIS — R011 Cardiac murmur, unspecified: Secondary | ICD-10-CM | POA: Insufficient documentation

## 2022-08-27 MED ORDER — CHLORTHALIDONE 25 MG PO TABS: 12.5000 mg | ORAL_TABLET | Freq: Every day | ORAL | 0 refills | Status: DC

## 2022-08-27 NOTE — Patient Instructions (Addendum)
Continue your amlodipine  Add the chlorthalidone 12.5 mg (1/2 of a 25 mg pill) -in the am  It will make you urinate more right after you take it   Avoid excess sodium and processed foods  Make sure you drink plenty of water   Follow up here in 10-14 days for  re check and labs

## 2022-08-27 NOTE — Progress Notes (Signed)
Subjective:    Patient ID: [name], female    DOB: 18-Dec-1940, 81 y.o.   MRN: [id number]  HPI Pt presents for HTN follow up   Wt Readings from Last 3 Encounters:  08/27/22 200 lb (90.7 kg)  07/22/22 206 lb (93.4 kg)  04/01/22 199 lb 6 oz (90.4 kg)   31.32 kg/m  HTN bp is stable today  No cp or palpitations or headaches or edema  No side effects to medicines  BP Readings from Last 3 Encounters:  08/27/22 (!) 156/76  07/22/22 (!) 160/70  04/01/22 135/70     Last visit enc pt to take her amlodipine daily instead of prn  Has been taking it every day 5 mg daily   She does not check her bp at home  Has a cuff but it does not work well    Still bothering the RA Methotrexate helps a little  Makes it hard to go out  Less able to clean house due to her arthritis   Lab Results  Component Value Date   CREATININE 0.82 01/17/2022   BUN 10 01/17/2022   NA 138 01/17/2022   K 3.9 01/17/2022   CL 104 01/17/2022   CO2 28 01/17/2022   Patient Active Problem List   Diagnosis Date Noted   Heart murmur 08/27/2022   Mobility impaired 07/22/2022   Current use of proton pump inhibitor 10/01/2021   General weakness 10/01/2021   Poor balance 10/01/2021   On prednisone therapy 10/01/2021   Controlled type 2 diabetes mellitus without complication, without long-term current use of insulin ([place]) 11/13/2020   Hyperlipidemia associated with type 2 diabetes mellitus ([place]) 10/15/2020   Pre-op exam 06/01/2020   Fatigue 01/30/2020   TMJ (dislocation of temporomandibular joint) 04/29/2019   Encounter for screening mammogram for breast cancer 10/27/2017   Routine general medical examination at a health care facility 10/27/2017   Generalized abdominal pain 03/31/2017   Aortic atherosclerosis ([place]) 02/03/2017   RA (rheumatoid arthritis) ([place])    Adjustment disorder with mixed anxiety and depressed mood 06/24/2016   Essential hypertension 01/07/2016   Coronary atherosclerosis  11/07/2015   Osteoarthritis 05/10/2014   GERD 07/09/2007   Past Medical History:  Diagnosis Date   Allergic rhinitis    Cataract 2015   bilateral   COVID-19 10/2019   ASYMPTOMATIC   Diabetes mellitus without complication ([place])    Diverticulosis    Esophageal reflux    Essential hypertension 01/07/2016   Fatty liver 2018   Fibula fracture 02/2005   Former smoker    Gastritis 2003   Heart murmur    MILD, NO CARDIOLOGIST   Hemorrhoids    History of cardiac dysrhythmia 2011   ABLATION  AT [facility]   History of kidney stones YRS AGO   Hyperglycemia    Hyperlipemia    Osteoarthritis    Personal history of colonic polyps 1998   hyperplastic   PMB (postmenopausal bleeding)    Pre-diabetes    BORDERLINE DIET CONTROLLED DOES NOT CHECK CBG   RA (rheumatoid arthritis) ([place])    Rectal bleed 2018   TRANSFUSION GIVEN   Shingles 2012   Uterine fibroid    Past Surgical History:  Procedure Laterality Date   CHOLECYSTECTOMY  YRS AGO   COLONOSCOPY  11/08   internal hemorrhoids   DILATATION & CURETTAGE/HYSTEROSCOPY WITH MYOSURE N/A 07/06/2020   Procedure: DILATATION & CURETTAGE/HYSTEROSCOPY;  Surgeon: Joseph Pierini, MD;  Location: [facility];  Service: Gynecology;  Laterality:  N/A;  request 7:30am OR time in [place] block requests 30 minutes   EP procedure  1994   SVT; normal echo   ESOPHAGOGASTRODUODENOSCOPY  11/03   reactive gastropathy   EYE SURGERY Bilateral 2017   CATARACTS   LIPOMA EXCISION  11/2002   SVT s/p surgery--? ablation  2011   AT [facility] AGO   vaginal polyp excised  12/2000   benign   Social History   Tobacco Use   Smoking status: Former    Packs/day: 0.75    Years: 15.00    Total pack years: 11.25    Types: Cigarettes    Quit date: 10/21/2003    Years since quitting: 18.8   Smokeless tobacco: Never  Vaping Use   Vaping Use: Never used  Substance Use Topics   Alcohol use: No    Alcohol/week: 0.0 standard  drinks of alcohol   Drug use: No   Family History  Problem Relation Age of Onset   Colon cancer Father 20   Diabetes Mother    Heart disease Mother    Kidney disease Mother        ESRD   Stroke Mother    Hypertension Brother    Breast cancer Sister 56   Diabetes Other        nephews   Colon cancer Sister    Esophageal cancer Neg Hx    Stomach cancer Neg Hx    Rectal cancer Neg Hx    Allergies  Allergen Reactions   Bee Venom Anaphylaxis   Dicyclomine     Blurry vision   Humira [Adalimumab]     LIPS SWELLED AND RASH   Metformin And Related     rash   Current Outpatient Medications on File Prior to Visit  Medication Sig Dispense Refill   Accu-Chek Softclix Lancets lancets Check glucose level daily and as needed for diabetes type 2  (lancets for the soft clicks lancing device AccuChek meter) 100 each 3   acetaminophen (TYLENOL) 325 MG tablet Take 650 mg by mouth every 6 (six) hours as needed.     albuterol (PROVENTIL HFA;VENTOLIN HFA) 108 (90 Base) MCG/ACT inhaler Inhale 2 puffs into the lungs every 4 (four) hours as needed for wheezing or shortness of breath. Please give spacer if possible 1 Inhaler 3   amLODipine (NORVASC) 5 MG tablet TAKE 1 TABLET BY MOUTH EVERY DAY AT NOON (Patient taking differently: PRN) 90 tablet 0   atorvastatin (LIPITOR) 10 MG tablet TAKE 1/2 TABLET BY MOUTH EVERY OTHER DAY 45 tablet 1   blood glucose meter kit and supplies To check glucose daily and prn for DM2 E11.9 1 each 0   dicyclomine (BENTYL) 10 MG/5ML solution Take 1-2 mLs (2-4 mg total) by mouth 4 (four) times daily -  before meals and at bedtime. Prev with blurry vision on higher dose but not an allergy and may be able to tolerate lower dose. 100 mL 3   docusate sodium (COLACE) 100 MG capsule Take 1 capsule (100 mg total) by mouth 2 (two) times daily. 60 capsule 0   EPINEPHrine (EPIPEN IJ) Inject as directed as needed. Reported on 12/05/2015     fluticasone (FLONASE) 50 MCG/ACT nasal spray PLACE  2 SPRAYS INTO THE NOSE DAILY. 48 g 3   folic acid (FOLVITE) 1 MG tablet Take 1 mg by mouth daily.     glucose blood (ACCU-CHEK GUIDE) test strip To check glucose daily for DM2 E11.9 100  strip 1   loratadine (CLARITIN) 10 MG tablet Take 10 mg by mouth daily. MAY TAKE AT HS ALSO     methotrexate (RHEUMATREX) 2.5 MG tablet Take 15 mg by mouth once a week. Caution:Chemotherapy. Protect from light. ( Tuesday of each week )     pantoprazole (PROTONIX) 40 MG tablet TAKE 1 TABLET BY MOUTH EVERY DAY AT NOON 90 tablet 0   polyethylene glycol (MIRALAX / GLYCOLAX) packet Take 17 g by mouth daily.     predniSONE (DELTASONE) 5 MG tablet Take 5 mg by mouth daily.     No current facility-administered medications on file prior to visit.    Review of Systems  Constitutional:  Positive for fatigue. Negative for activity change, appetite change, fever and unexpected weight change.  HENT:  Negative for congestion, ear pain, rhinorrhea, sinus pressure and sore throat.   Eyes:  Negative for pain, redness and visual disturbance.  Respiratory:  Negative for cough, shortness of breath and wheezing.   Cardiovascular:  Negative for chest pain and palpitations.  Gastrointestinal:  Negative for abdominal pain, blood in stool, constipation and diarrhea.  Endocrine: Negative for polydipsia and polyuria.  Genitourinary:  Negative for dysuria, frequency and urgency.  Musculoskeletal:  Positive for arthralgias. Negative for back pain and myalgias.  Skin:  Negative for pallor and rash.  Allergic/Immunologic: Negative for environmental allergies.  Neurological:  Negative for dizziness, syncope and headaches.  Hematological:  Negative for adenopathy. Does not bruise/bleed easily.  Psychiatric/Behavioral:  Negative for decreased concentration and dysphoric mood. The patient is not nervous/anxious.        Objective:   Physical Exam Constitutional:      General: She is not in acute distress.    Appearance: She is  well-developed.  HENT:     Head: Normocephalic and atraumatic.  Eyes:     Conjunctiva/sclera: Conjunctivae normal.     Pupils: Pupils are equal, round, and reactive to light.  Neck:     Thyroid: No thyromegaly.     Vascular: No carotid bruit or JVD.  Cardiovascular:     Rate and Rhythm: Normal rate and regular rhythm.     Heart sounds: Murmur heard.     No gallop.  Pulmonary:     Effort: Pulmonary effort is normal. No respiratory distress.     Breath sounds: Normal breath sounds. No wheezing or rales.  Abdominal:     General: There is no distension or abdominal bruit.     Palpations: Abdomen is soft.  Musculoskeletal:     Cervical back: Normal range of motion and neck supple.     Right lower leg: No edema.     Left lower leg: No edema.  Lymphadenopathy:     Cervical: No cervical adenopathy.  Skin:    General: Skin is warm and dry.     Coloration: Skin is not pale.     Findings: No rash.  Neurological:     Mental Status: She is alert.     Coordination: Coordination normal.     Deep Tendon Reflexes: Reflexes are normal and symmetric. Reflexes normal.  Psychiatric:        Mood and Affect: Mood normal.           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

## 2022-08-27 NOTE — Assessment & Plan Note (Signed)
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

## 2022-08-27 NOTE — Assessment & Plan Note (Signed)
No symptoms   Per echo in 2017 Pumping function of the heart is normal There is mild regurgitation of mitral valve and tricuspid valves of the heart  I do not think this should cause a problem ever for [name] Would not sched further echos

## 2022-08-28 ENCOUNTER — Encounter: Payer: Medicare Other | Attending: Family Medicine | Admitting: Dietician

## 2022-08-28 ENCOUNTER — Telehealth: Payer: Self-pay | Admitting: Family Medicine

## 2022-08-28 DIAGNOSIS — E119 Type 2 diabetes mellitus without complications: Secondary | ICD-10-CM | POA: Diagnosis not present

## 2022-08-28 NOTE — Telephone Encounter (Signed)
Patient called and stated she got a bill for the $50 dollars and wanted to know why. Patient wanted a call back.

## 2022-08-28 NOTE — Telephone Encounter (Signed)
We don't handle billing, please give pt billing #/ info

## 2022-08-28 NOTE — Telephone Encounter (Signed)
Called patient and gave her billing info

## 2022-09-02 ENCOUNTER — Encounter: Payer: Self-pay | Admitting: Dietician

## 2022-09-02 NOTE — Progress Notes (Signed)
Diabetes Self-Management Education  Visit Type: Follow-up  Appt. Start Time: 1550 Appt. End Time: 1620  09/02/2022  [name], identified by name and date of birth, is a 81 y.o. female with a diagnosis of Diabetes:  .   ASSESSMENT This is a phone visit.  Patient is at home and I am in my office.  She was last seen by this RD on 06/20/2022.  She reports a fasting blood glucose between 92-101. Drinking more water and rare regular sprout. Not active due to arthritis.  Slow walking and armchair m=causes increased pain.  She is not cooking secondary to arthritis.   Complains of decreased sense of taste and decreased appetite. She is not taking vitamin B-12.  Continues prednisone.  History includes Type 2 diabetes, rheumatoid arthritis Medications include prednisone, folic acid  Labs noted to include:  A1C 6.6% 07/21/2022,6.8% 3/312023  Vitamin B-12 224 10/01/2021.  She states that she is starting to have symptoms of neuropathy.   199 lbs 03/2022 stable from 200 lbs 01/2022.   Patient lives with her husband and grandson. They share shopping and cooking. 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 Unable to exercise, balance issues, leg pain, arthritis Diet: Diabetic, NAS  Doesn't like fruit much. Decreased sense of taste and appetite.    Diabetes Self-Management Education - 09/02/22 2041       Visit Information   Visit Type Follow-up      Psychosocial Assessment   What is the hardest part about your diabetes right now, causing you the most concern, or is the most worrisome to you about your diabetes?   Being active    Self-care barriers Debilitated state due to current medical condition    Other persons present Patient    Patient Concerns Nutrition/Meal planning;Weight Control;Healthy Lifestyle;Glycemic Control    Special Needs None    Preferred Learning Style No preference indicated    Learning Readiness Ready      Pre-Education Assessment    Patient understands the diabetes disease and treatment process. Comprehends key points    Patient understands incorporating nutritional management into lifestyle. Comprehends key points    Patient undertands incorporating physical activity into lifestyle. Comprehends key points    Patient understands using medications safely. Comprehends key points    Patient understands monitoring blood glucose, interpreting and using results Comprehends key points    Patient understands prevention, detection, and treatment of acute complications. Comprehends key points    Patient understands prevention, detection, and treatment of chronic complications. Compreheands key points    Patient understands how to develop strategies to address psychosocial issues. Comprehends key points    Patient understands how to develop strategies to promote health/change behavior. Comprehends key points      Complications   Last HgB A1C per patient/outside source 6.6 %   07/22/22   How often do you check your blood sugar? 1-2 times/day    Fasting Blood glucose range (mg/dL) 70-129    Number of hypoglycemic episodes per month 0      Dietary Intake   Breakfast toast, biscuit, eggs, grits, chicken njugget    Lunch sandwich    Dinner occasional mac and cheese, potato salad, pound cake OR eggs    Beverage(s) water. boost, smoothie      Activity / Exercise   Activity / Exercise Type ADL's      Patient Education   Previous Diabetes Education Yes (please comment)   06/20/2022   Healthy  Eating Plate Method;Meal options for control of blood glucose level and chronic complications.    Diabetes Stress and Support Identified and addressed patients feelings and concerns about diabetes;Worked with patient to identify barriers to care and solutions      Individualized Goals (developed by patient)   Nutrition General guidelines for healthy choices and portions discussed    Monitoring  Test my blood glucose as discussed    Problem Solving  Eating Pattern      Patient Self-Evaluation of Goals - Patient rates self as meeting previously set goals (% of time)   Nutrition 25 - 50% (sometimes)    Physical Activity < 25% (hardly ever/never)    Medications Not Applicable    Monitoring >75% (most of the time)    Problem Solving and behavior change strategies  25 - 50% (sometimes)    Reducing Risk (treating acute and chronic complications) Not Applicable    Health Coping 50 - 75 % (half of the time)      Post-Education Assessment   Patient understands the diabetes disease and treatment process. Demonstrates understanding / competency    Patient understands incorporating nutritional management into lifestyle. Needs Review    Patient undertands incorporating physical activity into lifestyle. Comprehends key points    Patient understands using medications safely. Demonstrates understanding / competency    Patient understands monitoring blood glucose, interpreting and using results Comprehends key points    Patient understands prevention, detection, and treatment of acute complications. Demonstrates understanding / competency    Patient understands prevention, detection, and treatment of chronic complications. Demonstrates understanding / competency    Patient understands how to develop strategies to address psychosocial issues. Demonstrates understanding / competency    Patient understands how to develop strategies to promote health/change behavior. Comprehends key points      Outcomes   Expected Outcomes Demonstrated interest in learning but significant barriers to change    Future DMSE 2 months    Program Status Not Completed             Individualized Plan for Diabetes Self-Management Training:   Learning Objective:  Patient will have a greater understanding of diabetes self-management. Patient education plan is to attend individual and/or group sessions per assessed needs and concerns.   Plan:   Patient Instructions   Recommend a Vitamin B-12 (sublingual or dissolvable).  Your vitamin B-12 lab was low. Walk 5 minutes at a time and slowly build up to 15 minutes daily.  Walk several times per day currently until you are able to walk 15 minutes at one time.  Walk indoors per your MD instruction.   Continue to be mindful of your eating choices. Bake rather than fry most often Choose whole grains and plenty of non starchy vegetables, small portion of lean protein (chicken, fish, lean meat, beans, egg, cheese, yogurt) Continue to stay hydrated. Continue to check your blood glucose periodically  Expected Outcomes:  Demonstrated interest in learning but significant barriers to change  Education material provided:   If problems or questions, patient to contact team via:  Phone  Future DSME appointment: 2 months

## 2022-09-02 NOTE — Patient Instructions (Signed)
Recommend a Vitamin B-12 (sublingual or dissolvable).  Your vitamin B-12 lab was low. Walk 5 minutes at a time and slowly build up to 15 minutes daily.  Walk several times per day currently until you are able to walk 15 minutes at one time.  Walk indoors per your MD instruction.   Continue to be mindful of your eating choices. Bake rather than fry most often Choose whole grains and plenty of non starchy vegetables, small portion of lean protein (chicken, fish, lean meat, beans, egg, cheese, yogurt) Continue to stay hydrated. Continue to check your blood glucose periodically

## 2022-09-19 ENCOUNTER — Telehealth: Payer: Self-pay | Admitting: Family Medicine

## 2022-09-19 NOTE — Telephone Encounter (Signed)
Pt notified of Dr. Marliss Coots comments. She said she doesn't think she is going to take it

## 2022-09-19 NOTE — Telephone Encounter (Signed)
Patient would like to now if you have any advice as to if Zhou black seed oil with thymoouinine in it would be safe for her to take for her arthritis ,being that she's a diabetic and have stomach issues? Her grandson brought it for her and said that it would help,she just would like advice as to have Dr Glori Bickers has heard anything regarding this oils?

## 2022-09-19 NOTE — Telephone Encounter (Signed)
I do not know a lot but this sort of product is not tested by the FDA so I cannot assure safety with it.

## 2022-09-23 ENCOUNTER — Other Ambulatory Visit: Payer: Self-pay | Admitting: Family Medicine

## 2022-09-26 ENCOUNTER — Telehealth: Payer: Self-pay

## 2022-09-26 NOTE — Progress Notes (Signed)
Chronic Care Management Pharmacy Assistant   Name: [name]  MRN: [id number] DOB: [birth year]  Reason for Encounter: CCM (Hypertension Disease State)  Recent office visits:  08/27/22 Loura Pardon, MD HTN Start: chlorthalidone (HYGROTON) 25 MG tablet F/U 10-14 days 07/22/22 Loura Pardon, MD DM A1c 6.6 Ordered: For home use only DME 4 wheeled rolling walker with seat   Recent consult visits:  None since last CCM contact  Hospital visits:  None in previous 6 months  Medications: Outpatient Encounter Medications as of 09/26/2022  Medication Sig   Accu-Chek Softclix Lancets lancets Check glucose level daily and as needed for diabetes type 2  (lancets for the soft clicks lancing device AccuChek meter)   acetaminophen (TYLENOL) 325 MG tablet Take 650 mg by mouth every 6 (six) hours as needed.   albuterol (PROVENTIL HFA;VENTOLIN HFA) 108 (90 Base) MCG/ACT inhaler Inhale 2 puffs into the lungs every 4 (four) hours as needed for wheezing or shortness of breath. Please give spacer if possible   amLODipine (NORVASC) 5 MG tablet TAKE 1 TABLET BY MOUTH EVERY DAY AT NOON (Patient taking differently: PRN)   atorvastatin (LIPITOR) 10 MG tablet TAKE 1/2 TABLET BY MOUTH EVERY OTHER DAY   blood glucose meter kit and supplies To check glucose daily and prn for DM2 E11.9   chlorthalidone (HYGROTON) 25 MG tablet Take 0.5 tablets (12.5 mg total) by mouth daily.   dicyclomine (BENTYL) 10 MG/5ML solution Take 1-2 mLs (2-4 mg total) by mouth 4 (four) times daily -  before meals and at bedtime. Prev with blurry vision on higher dose but not an allergy and may be able to tolerate lower dose.   docusate sodium (COLACE) 100 MG capsule Take 1 capsule (100 mg total) by mouth 2 (two) times daily.   EPINEPHrine (EPIPEN IJ) Inject as directed as needed. Reported on 12/05/2015   fluticasone (FLONASE) 50 MCG/ACT nasal spray PLACE 2 SPRAYS INTO THE NOSE DAILY.   folic acid (FOLVITE) 1 MG tablet Take 1 mg by mouth  daily.   glucose blood (ACCU-CHEK GUIDE) test strip To check glucose daily for DM2 E11.9   loratadine (CLARITIN) 10 MG tablet Take 10 mg by mouth daily. MAY TAKE AT HS ALSO   methotrexate (RHEUMATREX) 2.5 MG tablet Take 15 mg by mouth once a week. Caution:Chemotherapy. Protect from light. ( Tuesday of each week )   pantoprazole (PROTONIX) 40 MG tablet TAKE 1 TABLET BY MOUTH EVERY DAY AT NOON   polyethylene glycol (MIRALAX / GLYCOLAX) packet Take 17 g by mouth daily.   predniSONE (DELTASONE) 5 MG tablet Take 5 mg by mouth daily.   No facility-administered encounter medications on file as of 09/26/2022.    Recent Office Vitals: BP Readings from Last 3 Encounters:  08/27/22 (!) 150/78  07/22/22 (!) 160/70  04/01/22 135/70   Pulse Readings from Last 3 Encounters:  08/27/22 (!) 59  07/22/22 (!) 53  04/01/22 77    Wt Readings from Last 3 Encounters:  08/27/22 200 lb (90.7 kg)  07/22/22 206 lb (93.4 kg)  04/01/22 199 lb 6 oz (90.4 kg)     Kidney Function Lab Results  Component Value Date/Time   CREATININE 0.82 01/17/2022 10:32 AM   CREATININE 0.93 11/19/2021 02:55 PM   GFR 67.90 01/17/2022 10:32 AM   GFRNONAA >60 01/26/2017 04:59 AM   GFRAA >60 01/26/2017 04:59 AM       Latest Ref Rng & Units 01/17/2022   10:32 AM 11/19/2021  2:55 PM 10/01/2021   10:35 AM  BMP  Glucose 70 - 99 mg/dL 92  85  87   BUN 6 - 23 mg/dL _0 Creatinine 0.40 - 1.20 mg/dL 0.82  0.93  0.82   Sodium 135 - 145 mEq/L 138  136  141   Potassium 3.5 - 5.1 mEq/L 3.9  3.9  3.8   Chloride 96 - 112 mEq/L 104  101  104   CO2 19 - 32 mEq/L _1 Calcium 8.4 - 10.5 mg/dL 9.0  9.2  8.8     Contacted patient on 10/01/2022 to discuss hypertension disease state  Current antihypertensive regimen:  Amlodipine 5 mg daily - taking PRN   Patient verbally confirms she is taking the above medications as directed. Yes  Current home BP readings: She does not have a blood pressure cuff.    Wrist or arm  cuff: None Caffeine intake: Patient drinks soda (Sprite) occasionally; 1 cup of coffee a day Salt intake: Patient does not add salt after she is finished cooking.  Over the counter medications including pseudoephedrine or NSAIDs? Claritin daily. Tylenol 325 mg 1-2 tabs as needed due to leg pain.  What recent interventions/DTPs have been made by any provider to improve Blood Pressure control since last CPP Visit: Start: chlorthalidone (HYGROTON) 25 MG tablet F/U 10-14 days  Any recent hospitalizations or ED visits since last visit with CPP? No  What diet changes have been made to improve Blood Pressure Control?  No diet changes  What exercise is being done to improve your Blood Pressure Control?  Patient does her walking in the house as it is too cold outside.   Adherence Review: Is the patient currently on ACE/ARB medication? No Does the patient have >5 day gap between last estimated fill dates? No  Star Rating Drugs:  Medication:  Last Fill: Day Supply Atorvastatin 10 mg 06/28/2022 88  Care Gaps: Annual wellness visit in last year? Yes 11/11/21 Most Recent BP reading: 150/78 on 08/27/2022  If Diabetic: Most recent A1C reading: 6.6 on 07/22/2022 Last eye exam / retinopathy screening: 11/21/2019 Last diabetic foot exam: Up to date  Summary of recommendations from last [place] visit (Date:07/09/22)  Summary: CCM F/U visit -DM: A1c 6.8% (12/2021), pt did not tolerate metformin (rash); fasting BG at home 90-110 -BP: pt is not taking amlodipine consistently; she is not checking BP at home, BP is at goal in office visits   Recommendations/Changes made from today's visit: -Advised to take amlodipine daily and keep daily BP log until upcoming PCP appt 10/3   Plan: -Laurie will call patient 3 months for BP update -Pharmacist follow up televisit scheduled for 6 months -PCP appt 07/22/22  PCP appointment on 11/12/21 [facility] CCM appointment on 01/09/2023  Charlene Brooke, CPP notified  Marijean Niemann, [place] Assistant [phone]

## 2022-09-29 NOTE — Progress Notes (Signed)
Please have her come in for this  She usually cannot urinate to give the sample when she is here for routine visits

## 2022-09-29 NOTE — Progress Notes (Signed)
[facility] Quality Team Note  Name: [name] Date of Birth: 11/03/1940 MRN: [id number] Date: 09/29/2022  [facility] Quality Team has reviewed this patient's chart, please see recommendations below:  [facility] Quality Other; (KED GAP: KIDNEY HEALTH EVALUATION- PATIENT NEEDS URINE MICROALBUMIN/CREATININE RATIO TEST COMPLETED BEFORE END OF YEAR FOR GAP CLOSURE)

## 2022-09-30 ENCOUNTER — Telehealth: Payer: Self-pay | Admitting: Family Medicine

## 2022-09-30 DIAGNOSIS — E119 Type 2 diabetes mellitus without complications: Secondary | ICD-10-CM

## 2022-09-30 NOTE — Telephone Encounter (Signed)
-----   Message from [place], [place] sent at 09/30/2022  3:05 PM EST ----- Pt coming in next week to leave urine sample. Please put order in  ----- Message ----- From: Abner Greenspan, MD Sent: 09/29/2022   8:05 PM EST To: Tammi Sou, CMA     ----- Message ----- From: Tammi Sou, CMA Sent: 09/29/2022   2:42 PM EST To: Abner Greenspan, MD   ----- Message ----- From: Pilar Grammes, CMA Sent: 09/29/2022   1:56 PM EST To: Silvia Hightower Pool   ----- Message ----- From: Sofie Rower, NT Sent: 09/29/2022   1:52 PM EST To: [facility] Clinical Pool; [place]

## 2022-10-07 ENCOUNTER — Other Ambulatory Visit: Payer: Medicare Other

## 2022-10-07 DIAGNOSIS — E119 Type 2 diabetes mellitus without complications: Secondary | ICD-10-CM

## 2022-10-07 LAB — MICROALBUMIN / CREATININE URINE RATIO
Creatinine,U: 255.6 mg/dL
Microalb Creat Ratio: 1.2 mg/g (ref 0.0–30.0)
Microalb, Ur: 3 mg/dL — ABNORMAL HIGH (ref 0.0–1.9)

## 2022-10-08 ENCOUNTER — Encounter: Payer: Self-pay | Admitting: *Deleted

## 2022-10-11 ENCOUNTER — Other Ambulatory Visit: Payer: Self-pay | Admitting: Family Medicine

## 2022-11-13 ENCOUNTER — Telehealth: Payer: Self-pay | Admitting: Family Medicine

## 2022-11-13 ENCOUNTER — Encounter: Payer: Self-pay | Admitting: Dietician

## 2022-11-13 ENCOUNTER — Encounter: Payer: Medicare Other | Attending: Family Medicine | Admitting: Dietician

## 2022-11-13 DIAGNOSIS — Z1231 Encounter for screening mammogram for malignant neoplasm of breast: Secondary | ICD-10-CM

## 2022-11-13 DIAGNOSIS — E119 Type 2 diabetes mellitus without complications: Secondary | ICD-10-CM | POA: Diagnosis not present

## 2022-11-13 NOTE — Telephone Encounter (Signed)
The order is in  She can call to schedule

## 2022-11-13 NOTE — Addendum Note (Signed)
Addended by: Loura Pardon A on: 11/13/2022 03:51 PM   Modules accepted: Orders

## 2022-11-13 NOTE — Progress Notes (Signed)
This is a phone visit.  Patient is at her home and I am at my office.   I last had a visit with her on 08/28/2022 15 minute visit/  Has not started vitamin B-12 93-100 fasting blood glucose  History includes Type 2 diabetes, rheumatoid arthritis Medications include prednisone, folic acid  Labs noted to include:  A1C 6.6% 07/21/2022,6.8% 3/312023  Vitamin B-12 224 10/01/2021.  She states that she is starting to have symptoms of neuropathy.   199 lbs 03/2022 stable from 200 lbs 01/2022.   Patient lives with her husband and grandson. They share shopping and cooking. 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 Unable to exercise much due to balance issues, leg pain, arthritis.  She tries to walk some. Diet: Diabetic, NAS  Doesn't like fruit much. Decreased sense of taste and appetite.  24 hour recall: Breakfast: (late) eggs, white toast (dry), 1 small slice lunch meat Lunch:  none  - eats a late breakfast due to poor appetite Dinner:  salad frequently but was not feeling well after a Shingles vaccine last night and did not eat.  Encouraged her to eat regularly and balanced meals.  Blood glucose is well controlled fasting and recommended rotating the times that she checks between fasting and 2 hours after dinner. Encouraged her to purchase sublingual vitamin B-12 as this lab was low in the past.  Discussed implications with neuropathy and vitamin B-12 deficiency.  Will call patient in 6 months for another phone visit.  She can call for questions between visits.  Antonieta Iba, RD, LDN, CDCES

## 2022-11-13 NOTE — Telephone Encounter (Signed)
Patient called in and was wanting Dr. Glori Bickers to put a referral in for a mammogram at the [facility] location. Thank you!

## 2022-11-14 ENCOUNTER — Ambulatory Visit (INDEPENDENT_AMBULATORY_CARE_PROVIDER_SITE_OTHER): Payer: Medicare Other

## 2022-11-14 VITALS — Ht 67.0 in | Wt 200.0 lb

## 2022-11-14 DIAGNOSIS — Z0000 Encounter for general adult medical examination without abnormal findings: Secondary | ICD-10-CM | POA: Diagnosis not present

## 2022-11-14 NOTE — Progress Notes (Signed)
Subjective:   [name] is a 82 y.o. female who presents for Medicare Annual (Subsequent) preventive examination.  Review of Systems    No ROS.  Medicare Wellness Virtual Visit.  Visual/audio telehealth visit, UTA vital signs.   See social history for additional risk factors.   Cardiac Risk Factors include: advanced age (>5mn, >>14women);diabetes mellitus;hypertension     Objective:    Today's Vitals   11/14/22 1239  Weight: 200 lb (90.7 kg)  Height: '5\' 7"'$  (1.702 m)   Body mass index is 31.32 kg/m.     11/14/2022   12:39 PM 02/03/2022    4:12 PM 11/11/2021    2:48 PM 11/09/2020    2:46 PM 07/06/2020    5:52 AM 01/30/2020    3:32 PM 10/23/2017   10:47 AM  Advanced Directives  Does Patient Have a Medical Advance Directive? Yes Yes No No No Yes No  Type of AParamedicof APeach [place] will     [place] will   Does patient want to make changes to medical advance directive? No - Patient declined        Copy of [place] Chart? No - copy requested     No - copy requested   Would patient like information on creating a medical advance directive?   Yes (MAU/Ambulatory/Procedural Areas - Information given) No - Patient declined No - Patient declined  Yes (MAU/Ambulatory/Procedural Areas - Information given)    Current Medications (verified) Outpatient Encounter Medications as of 11/14/2022  Medication Sig   Accu-Chek Softclix Lancets lancets Check glucose level daily and as needed for diabetes type 2  (lancets for the soft clicks lancing device AccuChek meter)   acetaminophen (TYLENOL) 325 MG tablet Take 650 mg by mouth every 6 (six) hours as needed.   albuterol (PROVENTIL HFA;VENTOLIN HFA) 108 (90 Base) MCG/ACT inhaler Inhale 2 puffs into the lungs every 4 (four) hours as needed for wheezing or shortness of breath. Please give spacer if possible   amLODipine (NORVASC) 5 MG tablet TAKE 1 TABLET BY MOUTH  EVERY DAY AT NOON (Patient taking differently: PRN)   atorvastatin (LIPITOR) 10 MG tablet TAKE 1/2 TABLET BY MOUTH EVERY OTHER DAY   blood glucose meter kit and supplies To check glucose daily and prn for DM2 E11.9   chlorthalidone (HYGROTON) 25 MG tablet Take 0.5 tablets (12.5 mg total) by mouth daily.   dicyclomine (BENTYL) 10 MG/5ML solution Take 1-2 mLs (2-4 mg total) by mouth 4 (four) times daily -  before meals and at bedtime. Prev with blurry vision on higher dose but not an allergy and may be able to tolerate lower dose.   docusate sodium (COLACE) 100 MG capsule Take 1 capsule (100 mg total) by mouth 2 (two) times daily.   EPINEPHrine (EPIPEN IJ) Inject as directed as needed. Reported on 12/05/2015   fluticasone (FLONASE) 50 MCG/ACT nasal spray PLACE 2 SPRAYS INTO THE NOSE DAILY.   folic acid (FOLVITE) 1 MG tablet Take 1 mg by mouth daily.   glucose blood (ACCU-CHEK GUIDE) test strip To check glucose daily for DM2 E11.9   loratadine (CLARITIN) 10 MG tablet Take 10 mg by mouth daily. MAY TAKE AT HS ALSO   methotrexate (RHEUMATREX) 2.5 MG tablet Take 15 mg by mouth once a week. Caution:Chemotherapy. Protect from light. ( Tuesday of each week )   pantoprazole (PROTONIX) 40 MG tablet TAKE 1 TABLET BY MOUTH EVERY DAY AT [facility]  polyethylene glycol (MIRALAX / GLYCOLAX) packet Take 17 g by mouth daily.   predniSONE (DELTASONE) 5 MG tablet Take 5 mg by mouth daily.   No facility-administered encounter medications on file as of 11/14/2022.    Allergies (verified) Bee venom, Dicyclomine, Humira [adalimumab], and Metformin and related   History: Past Medical History:  Diagnosis Date   Allergic rhinitis    Cataract 2015   bilateral   COVID-19 10/2019   ASYMPTOMATIC   Diabetes mellitus without complication ([place])    Diverticulosis    Esophageal reflux    Essential hypertension 01/07/2016   Fatty liver 2018   Fibula fracture 02/2005   Former smoker    Gastritis 2003   Heart murmur     MILD, NO CARDIOLOGIST   Hemorrhoids    History of cardiac dysrhythmia 2011   ABLATION  AT [facility]   History of kidney stones YRS AGO   Hyperglycemia    Hyperlipemia    Osteoarthritis    Personal history of colonic polyps 1998   hyperplastic   PMB (postmenopausal bleeding)    Pre-diabetes    BORDERLINE DIET CONTROLLED DOES NOT CHECK CBG   RA (rheumatoid arthritis) ([place])    Rectal bleed 2018   TRANSFUSION GIVEN   Shingles 2012   Uterine fibroid    Past Surgical History:  Procedure Laterality Date   CHOLECYSTECTOMY  YRS AGO   COLONOSCOPY  11/08   internal hemorrhoids   DILATATION & CURETTAGE/HYSTEROSCOPY WITH MYOSURE N/A 07/06/2020   Procedure: DILATATION & CURETTAGE/HYSTEROSCOPY;  Surgeon: Joseph Pierini, MD;  Location: [facility];  Service: Gynecology;  Laterality: N/A;  request 7:30am OR time in [place] block requests 30 minutes   EP procedure  1994   SVT; normal echo   ESOPHAGOGASTRODUODENOSCOPY  11/03   reactive gastropathy   EYE SURGERY Bilateral 2017   CATARACTS   LIPOMA EXCISION  11/2002   SVT s/p surgery--? ablation  2011   AT [facility] AGO   vaginal polyp excised  12/2000   benign   Family History  Problem Relation Age of Onset   Colon cancer Father 42   Diabetes Mother    Heart disease Mother    Kidney disease Mother        ESRD   Stroke Mother    Hypertension Brother    Breast cancer Sister 75   Diabetes Other        nephews   Colon cancer Sister    Esophageal cancer Neg Hx    Stomach cancer Neg Hx    Rectal cancer Neg Hx    Social History   Socioeconomic History   Marital status: Married    Spouse name: Not on file   Number of children: 3   Years of education: Not on file   Highest education level: Not on file  Occupational History   Occupation: retired    Fish farm manager: RETIRED  Tobacco Use   Smoking status: Former    Packs/day: 0.75    Years: 15.00    Total pack years: 11.25    Types: Cigarettes     Quit date: 10/21/2003    Years since quitting: 19.0   Smokeless tobacco: Never  Vaping Use   Vaping Use: Never used  Substance and Sexual Activity   Alcohol use: No    Alcohol/week: 0.0 standard drinks of alcohol   Drug use: No   Sexual activity: Yes    Comment: 1st intercourse 82 yo-Fewer than  5 partners  Other Topics Concern   Not on file  Social History Narrative   Married      Retired      Occasional caffeine      No regular exercise   Social Determinants of Health   Financial Resource Strain: Low Risk  (11/14/2022)   Overall Financial Resource Strain (CARDIA)    Difficulty of Paying Living Expenses: Not hard at all  Food Insecurity: No Food Insecurity (11/14/2022)   Hunger Vital Sign    Worried About Running Out of Food in the Last Year: Never true    Ran Out of Food in the Last Year: Never true  Transportation Needs: No Transportation Needs (11/14/2022)   PRAPARE - Hydrologist (Medical): No    Lack of Transportation (Non-Medical): No  Physical Activity: Insufficiently Active (11/14/2022)   Exercise Vital Sign    Days of Exercise per Week: 2 days    Minutes of Exercise per Session: 40 min  Stress: No Stress Concern Present (11/14/2022)   [place]    Feeling of Stress : Not at all  Social Connections: Moderately Isolated (11/14/2022)   Social Connection and Isolation Panel [NHANES]    Frequency of Communication with Friends and Family: More than three times a week    Frequency of Social Gatherings with Friends and Family: More than three times a week    Attends Religious Services: Never    Marine scientist or Organizations: No    Attends Music therapist: Never    Marital Status: Married    Tobacco Counseling Counseling given: Not Answered   Clinical Intake:  Pre-visit preparation completed: Yes        Diabetes: Yes (Followed by  pcp)  How often do you need to have someone help you when you read instructions, pamphlets, or other written materials from your doctor or pharmacy?: 1 - Never    Interpreter Needed?: No      Activities of Daily Living    11/14/2022   12:48 PM  In your present state of health, do you have any difficulty performing the following activities:  Hearing? 0  Vision? 0  Difficulty concentrating or making decisions? 0  Walking or climbing stairs? 1  Dressing or bathing? 0  Doing errands, shopping? 1  Comment [place] and eating ? N  Using the Toilet? N  In the past six months, have you accidently leaked urine? N  Do you have problems with loss of bowel control? N  Managing your Medications? N  Managing your Finances? N  Housekeeping or managing your Housekeeping? N    Patient Care Team: [place], Wynelle Fanny, MD as PCP - General Fay Records, MD as Consulting Physician (Cardiology) Ladene Artist, MD as Consulting Physician (Gastroenterology) Lorelee Cover., MD as Consulting Physician (Ophthalmology) Charlton Haws, [facility] as Pharmacist (Pharmacist)  Indicate any recent Medical Services you may have received from other than [facility] providers in the past year (date may be approximate).     Assessment:   This is a routine wellness examination for [name].  I connected with  [name] on 11/14/22 by a audio enabled telemedicine application and verified that I am speaking with the correct person using two identifiers.  Patient Location: Home  Provider Location: Office/Clinic  I discussed the limitations of evaluation and management by telemedicine. The patient expressed understanding and agreed to  proceed.   Hearing/Vision screen Hearing Screening - Comments:: Patient is able to hear conversational tones without difficulty.  No issues reported.   Vision Screening - Comments:: Followed by Dr. Gloriann Loan Wears corrective lenses Cataract extraction,  bilateral Diabetic exam; no retinopathy reported They have seen their ophthalmologist in the last 12 months.    Dietary issues and exercise activities discussed: Current Exercise Habits: Home exercise routine, Type of exercise: walking, Time (Minutes): 40, Frequency (Times/Week): 2, Weekly Exercise (Minutes/Week): 80, Intensity: Mild   Goals Addressed               This Visit's Progress     Patient Stated     Increase physical activity (pt-stated)        Walk in the house for exercise       Depression Screen    11/14/2022   12:44 PM 02/03/2022    4:09 PM 11/11/2021    2:51 PM 11/09/2020    2:48 PM 01/30/2020    3:33 PM 01/30/2020    1:43 PM 10/29/2018   10:19 AM  PHQ 2/9 Scores  PHQ - 2 Score 0 1 0 0 0 1 0  PHQ- 9 Score    0 0      Fall Risk    11/14/2022   12:48 PM 02/03/2022    4:08 PM 11/11/2021    2:50 PM 11/09/2020    2:47 PM 01/30/2020    3:32 PM  Fall Risk   Falls in the past year? 0 1 0 0 0  Number falls in past yr: 0 1 0 0 0  Comment  fell this morning in the bathroom.  fell backwards.  did not hit her head     Injury with Fall? 0 0 0 0 0  Risk for fall due to :    Medication side effect Medication side effect  Follow up Falls evaluation completed;Falls prevention discussed  Falls prevention discussed Falls evaluation completed;Falls prevention discussed Falls evaluation completed;Falls prevention discussed    FALL RISK PREVENTION PERTAINING TO THE HOME: Home free of loose throw rugs in walkways, pet beds, electrical cords, etc? Yes  Adequate lighting in your home to reduce risk of falls? Yes   ASSISTIVE DEVICES UTILIZED TO PREVENT FALLS: Life alert? No  Use of a cane, walker or w/c? Yes  Grab bars in the bathroom? No  Shower chair or bench in shower? No  Elevated toilet seat or a handicapped toilet? No   TIMED UP AND GO: Was the test performed? No .   Cognitive Function:    11/09/2020    2:52 PM 01/30/2020    3:35 PM 10/23/2017   10:46 AM 01/03/2016    11:40 AM  MMSE - Mini Mental State Exam  Orientation to time '5 5 5 5  '$ Orientation to Place '5 5 5 5  '$ Registration '3 3 3 3  '$ Attention/ Calculation 5 5 0 5  Recall '3 3 3 3  '$ Language- name 2 objects   0 0  Language- repeat '1 1 1 1  '$ Language- follow 3 step command   3 3  Language- read & follow direction   0 1  Write a sentence   0 0  Copy design   0 0  Total score   20 26        11/14/2022   12:49 PM  6CIT Screen  What Year? 0 points  What month? 0 points  What time? 0 points  Count back from 20 0  points  Months in reverse 0 points  Repeat phrase 0 points  Total Score 0 points    Immunizations Immunization History  Administered Date(s) Administered   Fluad Quad(high Dose 65+) 08/14/2020, 07/22/2022   Influenza,inj,Quad PF,6+ Mos 08/10/2015, 08/28/2016, 10/27/2017, 07/27/2018   PFIZER Comirnaty(Gray Top)Covid-19 Tri-Sucrose Vaccine 07/06/2022   PFIZER(Purple Top)SARS-COV-2 Vaccination 12/18/2019, 01/17/2020, 10/18/2020, 04/29/2021   Pneumococcal Conjugate-13 01/03/2016   Pneumococcal Polysaccharide-23 04/24/2014   Td 09/04/2006   Zoster Recombinat (Shingrix) 11/12/2022   TDAP status: Due, Education has been provided regarding the importance of this vaccine. Advised may receive this vaccine at local pharmacy or Health Dept. Aware to provide a copy of the vaccination record if obtained from local pharmacy or Health Dept. Verbalized acceptance and understanding.  Shingles vaccine- 1st dose completed. Agrees to update second vaccine once completed.   Screening Tests Health Maintenance  Topic Date Due   DTaP/Tdap/Td (2 - Tdap) 09/04/2016   MAMMOGRAM  11/18/2018   COVID-19 Vaccine (6 - 2023-24 season) 11/30/2022 (Originally 08/31/2022)   OPHTHALMOLOGY EXAM  12/04/2022   Zoster Vaccines- Shingrix (2 of 2) 01/07/2023   Diabetic kidney evaluation - eGFR measurement  01/18/2023   HEMOGLOBIN A1C  01/21/2023   Diabetic kidney evaluation - Urine ACR  10/08/2023   Medicare  Annual Wellness (AWV)  11/15/2023   Pneumonia Vaccine 6+ Years old  Completed   INFLUENZA VACCINE  Completed   DEXA SCAN  Completed   HPV VACCINES  Aged Out   FOOT EXAM  Discontinued    Health Maintenance Health Maintenance Due  Topic Date Due   DTaP/Tdap/Td (2 - Tdap) 09/04/2016   MAMMOGRAM  11/18/2018   Lung Cancer Screening: (Low Dose CT Chest recommended if Age 15-80 years, 30 pack-year currently smoking OR have quit w/in 15years.) does not qualify.   Hepatitis C Screening: does not qualify.  Vision Screening: Recommended annual ophthalmology exams for early detection of glaucoma and other disorders of the eye.  Dental Screening: Recommended annual dental exams for proper oral hygiene  Community Resource Referral / Chronic Care Management: CRR required this visit?  No   CCM required this visit?  No      Plan:     I have personally reviewed and noted the following in the patient's chart:   Medical and social history Use of alcohol, tobacco or illicit drugs  Current medications and supplements including opioid prescriptions. Patient is not currently taking opioid prescriptions. Functional ability and status Nutritional status Physical activity Advanced directives List of other physicians Hospitalizations, surgeries, and ER visits in previous 12 months Vitals Screenings to include cognitive, depression, and falls Referrals and appointments  In addition, I have reviewed and discussed with patient certain preventive protocols, quality metrics, and best practice recommendations. A written personalized care plan for preventive services as well as general preventive health recommendations were provided to patient.     Leta Jungling, LPN   [id number]

## 2022-11-14 NOTE — Patient Instructions (Addendum)
[name] , Thank you for taking time to come for your Medicare Wellness Visit. I appreciate your ongoing commitment to your health goals. Please review the following plan we discussed and let me know if I can assist you in the future.   These are the goals we discussed:  Goals Addressed               This Visit's Progress     Patient Stated     Increase physical activity (pt-stated)        Walk in the house for exercise         This is a list of the screening recommended for you and due dates:  Health Maintenance  Topic Date Due   DTaP/Tdap/Td vaccine (2 - Tdap) 09/04/2016   Mammogram  11/18/2018   COVID-19 Vaccine (6 - 2023-24 season) 11/30/2022*   Eye exam for diabetics  12/04/2022   Zoster (Shingles) Vaccine (2 of 2) 01/07/2023   Yearly kidney function blood test for diabetes  01/18/2023   Hemoglobin A1C  01/21/2023   Yearly kidney health urinalysis for diabetes  10/08/2023   Medicare Annual Wellness Visit  11/15/2023   Pneumonia Vaccine  Completed   Flu Shot  Completed   DEXA scan (bone density measurement)  Completed   HPV Vaccine  Aged Out   Complete foot exam   Discontinued  *Topic was postponed. The date shown is not the original due date.    Advanced directives: End of life planning; Advance aging; Advanced directives discussed.  Copy of current HCPOA/Living Will requested.    Conditions/risks identified: none new  Next appointment: Follow up in one year for your annual wellness visit    Preventive Care 65 Years and Older, Female Preventive care refers to lifestyle choices and visits with your health care provider that can promote health and wellness. What does preventive care include? A yearly physical exam. This is also called an annual well check. Dental exams once or twice a year. Routine eye exams. Ask your health care provider how often you should have your eyes checked. Personal lifestyle choices, including: Daily care of your teeth and  gums. Regular physical activity. Eating a healthy diet. Avoiding tobacco and drug use. Limiting alcohol use. Practicing safe sex. Taking low-dose aspirin every day. Taking vitamin and mineral supplements as recommended by your health care provider. What happens during an annual well check? The services and screenings done by your health care provider during your annual well check will depend on your age, overall health, lifestyle risk factors, and family history of disease. Counseling  Your health care provider may ask you questions about your: Alcohol use. Tobacco use. Drug use. Emotional well-being. Home and relationship well-being. Sexual activity. Eating habits. History of falls. Memory and ability to understand (cognition). Work and work Statistician. Reproductive health. Screening  You may have the following tests or measurements: Height, weight, and BMI. Blood pressure. Lipid and cholesterol levels. These may be checked every 5 years, or more frequently if you are over 48 years old. Skin check. Lung cancer screening. You may have this screening every year starting at age 4 if you have a 30-pack-year history of smoking and currently smoke or have quit within the past 15 years. Fecal occult blood test (FOBT) of the stool. You may have this test every year starting at age 50. Flexible sigmoidoscopy or colonoscopy. You may have a sigmoidoscopy every 5 years or a colonoscopy every 10 years starting at age 55. Hepatitis  C blood test. Hepatitis B blood test. Sexually transmitted disease (STD) testing. Diabetes screening. This is done by checking your blood sugar (glucose) after you have not eaten for a while (fasting). You may have this done every 1-3 years. Bone density scan. This is done to screen for osteoporosis. You may have this done starting at age 40. Mammogram. This may be done every 1-2 years. Talk to your health care provider about how often you should have regular  mammograms. Talk with your health care provider about your test results, treatment options, and if necessary, the need for more tests. Vaccines  Your health care provider may recommend certain vaccines, such as: Influenza vaccine. This is recommended every year. Tetanus, diphtheria, and acellular pertussis (Tdap, Td) vaccine. You may need a Td booster every 10 years. Zoster vaccine. You may need this after age 51. Pneumococcal 13-valent conjugate (PCV13) vaccine. One dose is recommended after age 62. Pneumococcal polysaccharide (PPSV23) vaccine. One dose is recommended after age 33. Talk to your health care provider about which screenings and vaccines you need and how often you need them. This information is not intended to replace advice given to you by your health care provider. Make sure you discuss any questions you have with your health care provider. Document Released: 11/02/2015 Document Revised: 06/25/2016 Document Reviewed: 08/07/2015 Elsevier Interactive Patient Education  2017 [employer] Prevention in the Home Falls can cause injuries. They can happen to people of all ages. There are many things you can do to make your home safe and to help prevent falls. What can I do on the outside of my home? Regularly fix the edges of walkways and driveways and fix any cracks. Remove anything that might make you trip as you walk through a door, such as a raised step or threshold. Trim any bushes or trees on the path to your home. Use bright outdoor lighting. Clear any walking paths of anything that might make someone trip, such as rocks or tools. Regularly check to see if handrails are loose or broken. Make sure that both sides of any steps have handrails. Any raised decks and porches should have guardrails on the edges. Have any leaves, snow, or ice cleared regularly. Use sand or salt on walking paths during winter. Clean up any spills in your garage right away. This includes oil  or grease spills. What can I do in the bathroom? Use night lights. Install grab bars by the toilet and in the tub and shower. Do not use towel bars as grab bars. Use non-skid mats or decals in the tub or shower. If you need to sit down in the shower, use a plastic, non-slip stool. Keep the floor dry. Clean up any water that spills on the floor as soon as it happens. Remove soap buildup in the tub or shower regularly. Attach bath mats securely with double-sided non-slip rug tape. Do not have throw rugs and other things on the floor that can make you trip. What can I do in the bedroom? Use night lights. Make sure that you have a light by your bed that is easy to reach. Do not use any sheets or blankets that are too big for your bed. They should not hang down onto the floor. Have a firm chair that has side arms. You can use this for support while you get dressed. Do not have throw rugs and other things on the floor that can make you trip. What can I do in the  kitchen? Clean up any spills right away. Avoid walking on wet floors. Keep items that you use a lot in easy-to-reach places. If you need to reach something above you, use a strong step stool that has a grab bar. Keep electrical cords out of the way. Do not use floor polish or wax that makes floors slippery. If you must use wax, use non-skid floor wax. Do not have throw rugs and other things on the floor that can make you trip. What can I do with my stairs? Do not leave any items on the stairs. Make sure that there are handrails on both sides of the stairs and use them. Fix handrails that are broken or loose. Make sure that handrails are as long as the stairways. Check any carpeting to make sure that it is firmly attached to the stairs. Fix any carpet that is loose or worn. Avoid having throw rugs at the top or bottom of the stairs. If you do have throw rugs, attach them to the floor with carpet tape. Make sure that you have a light  switch at the top of the stairs and the bottom of the stairs. If you do not have them, ask someone to add them for you. What else can I do to help prevent falls? Wear shoes that: Do not have high heels. Have rubber bottoms. Are comfortable and fit you well. Are closed at the toe. Do not wear sandals. If you use a stepladder: Make sure that it is fully opened. Do not climb a closed stepladder. Make sure that both sides of the stepladder are locked into place. Ask someone to hold it for you, if possible. Clearly mark and make sure that you can see: Any grab bars or handrails. First and last steps. Where the edge of each step is. Use tools that help you move around (mobility aids) if they are needed. These include: Canes. Walkers. Scooters. Crutches. Turn on the lights when you go into a dark area. Replace any light bulbs as soon as they burn out. Set up your furniture so you have a clear path. Avoid moving your furniture around. If any of your floors are uneven, fix them. If there are any pets around you, be aware of where they are. Review your medicines with your doctor. Some medicines can make you feel dizzy. This can increase your chance of falling. Ask your doctor what other things that you can do to help prevent falls. This information is not intended to replace advice given to you by your health care provider. Make sure you discuss any questions you have with your health care provider. Document Released: 08/02/2009 Document Revised: 03/13/2016 Document Reviewed: 11/10/2014 Elsevier Interactive Patient Education  2017 [employer].

## 2022-11-14 NOTE — Telephone Encounter (Signed)
Pt notified and phone # provided

## 2022-11-24 NOTE — Telephone Encounter (Signed)
Patient called stating that the  location where the referral for mammogram was sent out too,would like Dr tower to give them a call. She called to schedule an appointment but they are saying they need to talk to Dr tower.

## 2022-11-24 NOTE — Telephone Encounter (Signed)
Did I sign already? Not in box, thanks

## 2022-11-24 NOTE — Telephone Encounter (Signed)
Called and no one answered but it looks like pt may have told them she had breast pain because there is an order in PCP's inbox for her to sign off on stating pt advised them of breast pain. Please review

## 2022-11-24 NOTE — Telephone Encounter (Signed)
Sorry my error, in inbasket to sign electronically since pt's going to [facility]

## 2022-12-26 ENCOUNTER — Other Ambulatory Visit: Payer: Self-pay | Admitting: Family Medicine

## 2022-12-28 ENCOUNTER — Other Ambulatory Visit: Payer: Self-pay | Admitting: Family Medicine

## 2022-12-29 NOTE — Telephone Encounter (Signed)
Last A1c was 07/22/22, no future appts., please advise if Pt needs appt or okay to refill med

## 2022-12-30 DIAGNOSIS — M0579 Rheumatoid arthritis with rheumatoid factor of multiple sites without organ or systems involvement: Secondary | ICD-10-CM | POA: Diagnosis not present

## 2022-12-30 DIAGNOSIS — M7989 Other specified soft tissue disorders: Secondary | ICD-10-CM | POA: Diagnosis not present

## 2022-12-30 DIAGNOSIS — M5136 Other intervertebral disc degeneration, lumbar region: Secondary | ICD-10-CM | POA: Diagnosis not present

## 2022-12-30 DIAGNOSIS — M1991 Primary osteoarthritis, unspecified site: Secondary | ICD-10-CM | POA: Diagnosis not present

## 2023-01-05 ENCOUNTER — Telehealth: Payer: Self-pay

## 2023-01-05 NOTE — Progress Notes (Signed)
Care Management [facility] Coordination Services Pharmacy Team  Reason for Encounter: Appointment Reminder  Contacted patient to confirm telephone appointment with Charlene Brooke, PharmD on 01/09/2023 at 1:00.  Spoke with patient on 01/05/2023   Do you have any problems getting your medications? No  What is your top health concern you would like to discuss at your upcoming visit? Patient states she is very glad to report she does not have health concerns right now.   Have you seen any other providers since your last visit with PCP? No  Star Rating Drugs:  Medication:  Last Fill: Day Supply Atorvastatin 10 mg 12/26/2022 88  Care Gaps: Annual wellness visit in last year? Yes 11/14/2022  If Diabetic: Last eye exam / retinopathy screening: Overdue Last diabetic foot exam: Up to date  Charlene Brooke, PharmD notified  Marijean Niemann, [place] Assistant [phone]

## 2023-01-06 ENCOUNTER — Ambulatory Visit (INDEPENDENT_AMBULATORY_CARE_PROVIDER_SITE_OTHER): Payer: Medicare Other | Admitting: Family Medicine

## 2023-01-06 ENCOUNTER — Telehealth: Payer: Self-pay | Admitting: *Deleted

## 2023-01-06 ENCOUNTER — Encounter: Payer: Self-pay | Admitting: Family Medicine

## 2023-01-06 VITALS — BP 139/70 | HR 91 | Temp 98.2°F | Ht 67.0 in | Wt 205.2 lb

## 2023-01-06 DIAGNOSIS — I10 Essential (primary) hypertension: Secondary | ICD-10-CM | POA: Diagnosis not present

## 2023-01-06 DIAGNOSIS — N644 Mastodynia: Secondary | ICD-10-CM | POA: Insufficient documentation

## 2023-01-06 DIAGNOSIS — S90211A Contusion of right great toe with damage to nail, initial encounter: Secondary | ICD-10-CM | POA: Insufficient documentation

## 2023-01-06 MED ORDER — FLUTICASONE PROPIONATE 50 MCG/ACT NA SUSP: NASAL | 3 refills | Status: AC

## 2023-01-06 NOTE — Progress Notes (Signed)
Subjective:    Patient ID: [name], female    DOB: 1941-08-14, 82 y.o.   MRN: [id number]  HPI Pt presents for breast pain  Also dark toe nail after trauma  Hypertension follow up   Needs refill of flonase  Wt Readings from Last 3 Encounters:  01/06/23 205 lb 4 oz (93.1 kg)  11/14/22 200 lb (90.7 kg)  08/27/22 200 lb (90.7 kg)   32.15 kg/m  Vitals:   01/06/23 1047  BP: (!) 146/72  Pulse: 91  Temp: 98.2 F (36.8 C)  SpO2: 96%    Mammogram 10/2017 was negative/normal A mammogram was ordered on 11/13/22 She goes to the breast center of [facility]   Left breast hurts Off/on for 2 months off and on  Sometimes sharp in nature  Lasts about 15 minutes at a time  Moderate  Some breast tenderness  No skin changes  No nipple discharge      Tumor was removed from her shoulder 5-6 y ago  ? If related  That was ok   Toe nail -R great toe  Dark for about 3-4 months  Injured it on and escalator /hit foot and stubbed it  Occ is sore / now and then    Sister had breast cancer at age 62   RA Some OA Sees rheum Dr Amil Amen Mtx  Hydroxychloroquine prednisone Watches labs frequently   HTN bp is stable today  No cp or palpitations or headaches or edema  No side effects to medicines  BP Readings from Last 3 Encounters:  01/06/23 (!) 146/72  08/27/22 (!) 150/78  07/22/22 (!) 160/70    Last visit we added chlorthalidone 12.5 mg daily with plan to follow up in 10-14 d Lab Results  Component Value Date   CREATININE 0.82 01/17/2022   BUN 10 01/17/2022   NA 138 01/17/2022   K 3.9 01/17/2022   CL 104 01/17/2022   CO2 28 01/17/2022  No side effects or problems with medicine   Bp at home runs fair - occ over 140 on top   No arb or ace in light of anaphylaxis with bee stings in the past   Patient Active Problem List   Diagnosis Date Noted   Breast pain, left 01/06/2023   Contusion of right great toe with damage to nail 01/06/2023   Heart murmur  08/27/2022   Mobility impaired 07/22/2022   Current use of proton pump inhibitor 10/01/2021   General weakness 10/01/2021   Poor balance 10/01/2021   On prednisone therapy 10/01/2021   Controlled type 2 diabetes mellitus without complication, without long-term current use of insulin ([place]) 11/13/2020   Hyperlipidemia associated with type 2 diabetes mellitus ([place]) 10/15/2020   Pre-op exam 06/01/2020   Fatigue 01/30/2020   TMJ (dislocation of temporomandibular joint) 04/29/2019   Encounter for screening mammogram for breast cancer 10/27/2017   Routine general medical examination at a health care facility 10/27/2017   Generalized abdominal pain 03/31/2017   Aortic atherosclerosis ([place]) 02/03/2017   RA (rheumatoid arthritis) ([place])    Adjustment disorder with mixed anxiety and depressed mood 06/24/2016   Essential hypertension 01/07/2016   Coronary atherosclerosis 11/07/2015   Osteoarthritis 05/10/2014   GERD 07/09/2007   Past Medical History:  Diagnosis Date   Allergic rhinitis    Cataract 2015   bilateral   COVID-19 10/2019   ASYMPTOMATIC   Diabetes mellitus without complication ([place])    Diverticulosis    Esophageal reflux    Essential  hypertension 01/07/2016   Fatty liver 2018   Fibula fracture 02/2005   Former smoker    Gastritis 2003   Heart murmur    MILD, NO CARDIOLOGIST   Hemorrhoids    History of cardiac dysrhythmia 2011   ABLATION  AT [facility]   History of kidney stones YRS AGO   Hyperglycemia    Hyperlipemia    Osteoarthritis    Personal history of colonic polyps 1998   hyperplastic   PMB (postmenopausal bleeding)    Pre-diabetes    BORDERLINE DIET CONTROLLED DOES NOT CHECK CBG   RA (rheumatoid arthritis) ([place])    Rectal bleed 2018   TRANSFUSION GIVEN   Shingles 2012   Uterine fibroid    Past Surgical History:  Procedure Laterality Date   CHOLECYSTECTOMY  YRS AGO   COLONOSCOPY  11/08   internal hemorrhoids   DILATATION & CURETTAGE/HYSTEROSCOPY WITH  MYOSURE N/A 07/06/2020   Procedure: DILATATION & CURETTAGE/HYSTEROSCOPY;  Surgeon: Joseph Pierini, MD;  Location: [facility];  Service: Gynecology;  Laterality: N/A;  request 7:30am OR time in [place] block requests 30 minutes   EP procedure  1994   SVT; normal echo   ESOPHAGOGASTRODUODENOSCOPY  11/03   reactive gastropathy   EYE SURGERY Bilateral 2017   CATARACTS   LIPOMA EXCISION  11/2002   SVT s/p surgery--? ablation  2011   AT [place] AGO   vaginal polyp excised  12/2000   benign   Social History   Tobacco Use   Smoking status: Former    Packs/day: 0.75    Years: 15.00    Additional pack years: 0.00    Total pack years: 11.25    Types: Cigarettes    Quit date: 10/21/2003    Years since quitting: 19.2   Smokeless tobacco: Never  Vaping Use   Vaping Use: Never used  Substance Use Topics   Alcohol use: No    Alcohol/week: 0.0 standard drinks of alcohol   Drug use: No   Family History  Problem Relation Age of Onset   Colon cancer Father 32   Diabetes Mother    Heart disease Mother    Kidney disease Mother        ESRD   Stroke Mother    Hypertension Brother    Breast cancer Sister 30   Diabetes Other        nephews   Colon cancer Sister    Esophageal cancer Neg Hx    Stomach cancer Neg Hx    Rectal cancer Neg Hx    Allergies  Allergen Reactions   Bee Venom Anaphylaxis   Dicyclomine     Blurry vision   Humira [Adalimumab]     LIPS SWELLED AND RASH   Metformin And Related     rash   Current Outpatient Medications on File Prior to Visit  Medication Sig Dispense Refill   Accu-Chek Softclix Lancets lancets Check glucose level daily and as needed for diabetes type 2  (lancets for the soft clicks lancing device AccuChek meter) 100 each 3   acetaminophen (TYLENOL) 325 MG tablet Take 650 mg by mouth every 6 (six) hours as needed.     albuterol (PROVENTIL HFA;VENTOLIN HFA) 108 (90 Base) MCG/ACT inhaler Inhale 2 puffs  into the lungs every 4 (four) hours as needed for wheezing or shortness of breath. Please give spacer if possible 1 Inhaler 3   amLODipine (NORVASC) 5 MG tablet TAKE 1 TABLET BY MOUTH  EVERY DAY AT NOON (Patient taking differently: PRN) 90 tablet 0   atorvastatin (LIPITOR) 10 MG tablet TAKE 1/2 TABLET BY MOUTH EVERY OTHER DAY 22 tablet 0   blood glucose meter kit and supplies To check glucose daily and prn for DM2 E11.9 1 each 0   chlorthalidone (HYGROTON) 25 MG tablet Take 0.5 tablets (12.5 mg total) by mouth daily. 15 tablet 0   dicyclomine (BENTYL) 10 MG/5ML solution Take 1-2 mLs (2-4 mg total) by mouth 4 (four) times daily -  before meals and at bedtime. Prev with blurry vision on higher dose but not an allergy and may be able to tolerate lower dose. 100 mL 3   docusate sodium (COLACE) 100 MG capsule Take 1 capsule (100 mg total) by mouth 2 (two) times daily. 60 capsule 0   EPINEPHrine (EPIPEN IJ) Inject as directed as needed. Reported on [id number]     folic acid (FOLVITE) 1 MG tablet Take 1 mg by mouth daily.     glucose blood (ACCU-CHEK GUIDE) test strip CHECK BLOOD SUGAR DAILY 100 strip 1   hydroxychloroquine (PLAQUENIL) 200 MG tablet Take 200 mg by mouth 2 (two) times daily.     loratadine (CLARITIN) 10 MG tablet Take 10 mg by mouth daily. MAY TAKE AT HS ALSO     methotrexate (RHEUMATREX) 2.5 MG tablet Take 15 mg by mouth once a week. Caution:Chemotherapy. Protect from light. ( Tuesday of each week )     pantoprazole (PROTONIX) 40 MG tablet TAKE 1 TABLET BY MOUTH EVERY DAY AT NOON 90 tablet 1   polyethylene glycol (MIRALAX / GLYCOLAX) packet Take 17 g by mouth daily.     predniSONE (DELTASONE) 5 MG tablet Take 5 mg by mouth daily.     No current facility-administered medications on file prior to visit.    Review of Systems  Constitutional:  Positive for fatigue. Negative for activity change, appetite change, fever and unexpected weight change.  HENT:  Negative for congestion, ear pain,  rhinorrhea, sinus pressure and sore throat.   Eyes:  Negative for pain, redness and visual disturbance.  Respiratory:  Negative for cough, shortness of breath and wheezing.   Cardiovascular:  Negative for chest pain and palpitations.  Gastrointestinal:  Negative for abdominal pain, blood in stool, constipation and diarrhea.  Endocrine: Negative for polydipsia and polyuria.  Genitourinary:  Negative for dysuria, frequency and urgency.       Left breast pain   Musculoskeletal:  Positive for arthralgias and back pain. Negative for myalgias.  Skin:  Negative for pallor and rash.       Toe nail injury with color change   Allergic/Immunologic: Negative for environmental allergies.  Neurological:  Negative for dizziness, syncope and headaches.  Hematological:  Negative for adenopathy. Does not bruise/bleed easily.  Psychiatric/Behavioral:  Negative for decreased concentration and dysphoric mood. The patient is not nervous/anxious.        Objective:   Physical Exam Constitutional:      General: She is not in acute distress.    Appearance: Normal appearance. She is well-developed. She is obese. She is not ill-appearing or diaphoretic.  HENT:     Head: Normocephalic and atraumatic.     Mouth/Throat:     Mouth: Mucous membranes are moist.  Eyes:     General: No scleral icterus.       Right eye: No discharge.        Left eye: No discharge.     Conjunctiva/sclera: Conjunctivae normal.  Pupils: Pupils are equal, round, and reactive to light.  Neck:     Thyroid: No thyromegaly.     Vascular: No carotid bruit or JVD.  Cardiovascular:     Rate and Rhythm: Normal rate and regular rhythm.     Heart sounds: Normal heart sounds.     No gallop.  Pulmonary:     Effort: Pulmonary effort is normal. No respiratory distress.     Breath sounds: Normal breath sounds. No wheezing or rales.  Abdominal:     General: There is no distension or abdominal bruit.     Palpations: Abdomen is soft.   Genitourinary:    Comments: Breast exam: No mass, nodules, thickening bulging, retraction, inflamation, nipple discharge or skin changes noted.  No axillary or clavicular LA.    Tenderness in L lateral breast w/o mass   Musculoskeletal:     Cervical back: Normal range of motion and neck supple.     Right lower leg: No edema.     Left lower leg: No edema.  Lymphadenopathy:     Cervical: No cervical adenopathy.  Skin:    General: Skin is warm and dry.     Coloration: Skin is not pale.     Findings: No rash.     Comments: Some scattered SKs  L great toe nail shows signs of trauma with black color/hematoma under nail and some abrasion laterally  Not loose  No toe swelling  Not tender   Neurological:     Mental Status: She is alert.     Cranial Nerves: No cranial nerve deficit.     Motor: No weakness.     Coordination: Coordination normal.     Deep Tendon Reflexes: Reflexes are normal and symmetric. Reflexes normal.  Psychiatric:        Mood and Affect: Mood normal.           Assessment & Plan:   Problem List Items Addressed This Visit       Cardiovascular and Mediastinum   Essential hypertension    bp in fair control at this time  BP Readings from Last 1 Encounters:  01/06/23 139/70  No changes needed Most recent labs reviewed  Disc lifstyle change with low sodium diet and exercise  Last visit failed to f/u after adding chlorthaldone 12.5 mg daily  We need labs for this as well as follow up  Will continue to monitor at home      Relevant Orders   Basic metabolic panel     Musculoskeletal and Integument   Contusion of right great toe with damage to nail    R great toe  Injured on escalator Nail is dark/slt torn (superficial) but not loose  Inst to protect this while it heals and update if symptoms worsen Will take 6 mo likely for discolored nail to grow out         Other   Breast pain, left - Primary    With tenderness Otherwise nl exam  Ordered  diag mm bilat and [place] on L  Pending report Enc to avoid excessive caffeine  Pt will call the imaging center to schedule   She is overdue for screening mam  Also has fam hx of breast cancer in sister at young age       Relevant Orders   MM 3D DIAGNOSTIC MAMMOGRAM BILATERAL BREAST   US BREAST COMPLETE UNI LEFT INC AXILLA

## 2023-01-06 NOTE — Assessment & Plan Note (Addendum)
With tenderness Otherwise nl exam  Ordered diag mm bilat and [place] on L  Pending report Enc to avoid excessive caffeine  Pt will call the imaging center to schedule   She is overdue for screening mam  Also has fam hx of breast cancer in sister at young age

## 2023-01-06 NOTE — Patient Instructions (Addendum)
Labs today for blood pressure medicine   Continue the chlorthalidone as ordered  Blood pressure is a little improved  Keep watching it   For general health I want you to exercise as tolerated  Walking or water exercise or pedaling is good   Add some strength training to your routine, this is important for bone and brain health and can reduce your risk of falls and help your body use insulin properly and regulate weight  Light weights, exercise bands , and internet videos are a good way to start  Yoga (chair or regular), machines , floor exercises or a gym with machines are also good options   The toe nail may take as long as 6 months to clear  If it hurts or worsens let us know     Avoid caffeine if you can because it can cause breast pain     I will order breast imaging  I do not feel a lump today   Call the breast center to set up your imaging  Please call the location of your choice from the menu below to schedule your Mammogram and/or Bone Density appointment.    [facility]                      Phone:  [phone], [place] [address]                                                             Services: Traditional and 3D Mammogram, [place] [facility] Density                 Phone: [phone] [address], [place] [address]    Service: Bone Density ONLY   *this site does NOT perform mammograms  [facility]                        Phone:  [phone] [address], [place] [address]                                            Services:  3D Mammogram and [place] at [facility]   Phone:  [phone]   [place]  [place], [place] [address]                                            Services: 3D Mammogram and [place]  [place] at [facility])  Phone:  [phone]   [address] Room [place], [place] [address]                                              Services:  3D Mammogram and Bone Density

## 2023-01-06 NOTE — Assessment & Plan Note (Signed)
R great toe  Injured on escalator Nail is dark/slt torn (superficial) but not loose  Inst to protect this while it heals and update if symptoms worsen Will take 6 mo likely for discolored nail to grow out

## 2023-01-06 NOTE — Telephone Encounter (Signed)
PCP forgot to have pt stop by the lab when she was here for her appt today. Called pt but no answer so Left VM requesting pt to call the office back   **if pt calls back please schedule a non fasting lab appt anytime pt can come back** order are in epic already

## 2023-01-06 NOTE — Assessment & Plan Note (Signed)
bp in fair control at this time  BP Readings from Last 1 Encounters:  01/06/23 139/70   No changes needed Most recent labs reviewed  Disc lifstyle change with low sodium diet and exercise  Last visit failed to f/u after adding chlorthaldone 12.5 mg daily  We need labs for this as well as follow up  Will continue to monitor at home

## 2023-01-09 ENCOUNTER — Ambulatory Visit: Payer: Medicare Other | Admitting: Pharmacist

## 2023-01-09 DIAGNOSIS — I10 Essential (primary) hypertension: Secondary | ICD-10-CM

## 2023-01-09 DIAGNOSIS — E119 Type 2 diabetes mellitus without complications: Secondary | ICD-10-CM

## 2023-01-09 DIAGNOSIS — I700 Atherosclerosis of aorta: Secondary | ICD-10-CM

## 2023-01-09 NOTE — Telephone Encounter (Signed)
Scheduled lab appt for 01/21/23 @ 2pm.

## 2023-01-09 NOTE — Patient Instructions (Signed)
Visit Information  Phone number for Pharmacist: [phone]  Thank you for meeting with me to discuss your medications! Below is a summary of what we talked about during the visit:   Recommendations/Changes made from today's visit: -Removed chlorthalidone from list (has not taken in months) -advised taking amlodipine daily and monitoring BP -Scheduled lab appt for BMP 01/21/23  Follow up plan: -Health Concierge will call patient 1 month for BP update/amlodipine adherence -Pharmacist follow up televisit scheduled for 6 months -Mammogram/US 01/27/23   Charlene Brooke, PharmD, BCACP Clinical Pharmacist [facility] at [facility] [phone]

## 2023-01-09 NOTE — Progress Notes (Signed)
Care Management & Coordination Services Pharmacy Note  01/09/2023 Name:  [name] MRN:  [id number] DOB:  12/09/1940  Summary: -HTN: BP recently at goal in office; pt is not taking amlodipine daily (taking PRN - last filled 09/2021 #90 and pt is still using that bottle which is still pretty full); she is also not taking chlorthalidone, original Rx from Nov was for #15 tablets and pt did not refill it -Pt due for BMP, scheduled  Recommendations/Changes made from today's visit: -Removed chlorthalidone from list (has not taken in months) -advised taking amlodipine daily and monitoring BP -Scheduled lab appt for BMP 01/21/23  Follow up plan: -Health Concierge will call patient 1 month for BP update/amlodipine adherence -Pharmacist follow up televisit scheduled for 6 months -Mammogram/US 01/27/23    Subjective: [name] is an 82 y.o. year old female who is a primary patient of Tower, Wynelle Fanny, MD.  The care coordination team was consulted for assistance with disease management and care coordination needs.    Engaged with patient by telephone for follow up visit.  Recent office visits: 01/06/23 Dr Glori Bickers OV: BP 139/70. breast pain - ordered mammogram and [place]. Also BMP.  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 Follows with nutritionist for DM (RD Antonieta Iba)  Hospital visits: None in previous 6 months   Objective:  Lab Results  Component Value Date   CREATININE 0.82 01/17/2022   BUN 10 01/17/2022   GFR 67.90 01/17/2022   GFRNONAA >60 01/26/2017   GFRAA >60 01/26/2017   NA 138 01/17/2022   K 3.9 01/17/2022   CALCIUM 9.0 01/17/2022   CO2 28 01/17/2022   GLUCOSE 92 01/17/2022    Lab Results  Component  Value Date/Time   HGBA1C 6.6 (A) 07/22/2022 11:24 AM   HGBA1C 6.8 (H) 01/17/2022 10:32 AM   HGBA1C 6.6 (H) 10/01/2021 10:35 AM   GFR 67.90 01/17/2022 10:32 AM   GFR 58.44 (L) 11/19/2021 02:55 PM   MICROALBUR 3.0 (H) 10/07/2022 01:52 PM   MICROALBUR 0.9 10/29/2018 11:05 AM    Last diabetic Eye exam: No results found for: "HMDIABEYEEXA"  Last diabetic Foot exam: No results found for: "HMDIABFOOTEX"   Lab Results  Component Value Date   CHOL 137 01/17/2022   HDL 46.00 01/17/2022   LDLCALC 64 01/17/2022   LDLDIRECT 60.0 10/26/2018   TRIG 136.0 01/17/2022   CHOLHDL 3 01/17/2022       Latest Ref Rng & Units 01/17/2022   10:32 AM 11/19/2021    2:55 PM 10/01/2021   10:35 AM  Hepatic Function  Total Protein 6.0 - 8.3 g/dL 7.4  8.2  6.9   Albumin 3.5 - 5.2 g/dL 3.9  4.0  3.6   AST 0 - 37 U/L 17  24  12    ALT 0 - 35 U/L 6  10  5    Alk Phosphatase 39 - 117 U/L 57  58  59   Total Bilirubin 0.2 - 1.2 mg/dL 0.4  0.6  0.5   Bilirubin, Direct 0.0 - 0.3 mg/dL  0.2      Lab Results  Component Value Date/Time   TSH 2.36 10/01/2021 10:35 AM   TSH 0.96 11/13/2020 12:09 PM   FREET4  0.85 10/01/2021 10:35 AM   FREET4 0.77 04/27/2014 10:02 AM       Latest Ref Rng & Units 01/17/2022   10:32 AM 11/19/2021    2:55 PM 10/01/2021   10:35 AM  CBC  WBC 4.0 - 10.5 K/uL 3.2  5.0  5.1   Hemoglobin 12.0 - 15.0 g/dL 12.9  14.2  12.9   Hematocrit 36.0 - 46.0 % 38.4  43.3  38.9   Platelets 150.0 - 400.0 K/uL 256.0  340.0  257.0     Lab Results  Component Value Date/Time   VITAMINB12 224 10/01/2021 10:35 AM   VITAMINB12 275 04/26/2014 11:32 AM    Clinical ASCVD: Yes  The ASCVD Risk score (Arnett DK, et al., 2019) failed to calculate for the following reasons:   The 2019 ASCVD risk score is only valid for ages 53 to 40        01/06/2023   11:36 AM 11/14/2022   12:44 PM 02/03/2022    4:09 PM  Depression screen PHQ 2/9  Decreased Interest 2 0 0  Down, Depressed, Hopeless 1 0 1  PHQ - 2 Score  3 0 1  Altered sleeping 0    Tired, decreased energy 2    Change in appetite 3    Feeling bad or failure about yourself  1    Trouble concentrating 0    Moving slowly or fidgety/restless 0    Suicidal thoughts 0    PHQ-9 Score 9    Difficult doing work/chores Very difficult       Social History   Tobacco Use  Smoking Status Former   Packs/day: 0.75   Years: 15.00   Additional pack years: 0.00   Total pack years: 11.25   Types: Cigarettes   Quit date: 10/21/2003   Years since quitting: 19.2  Smokeless Tobacco Never   BP Readings from Last 3 Encounters:  01/06/23 139/70  08/27/22 (!) 150/78  07/22/22 (!) 160/70   Pulse Readings from Last 3 Encounters:  01/06/23 91  08/27/22 (!) 59  07/22/22 (!) 53   Wt Readings from Last 3 Encounters:  01/06/23 205 lb 4 oz (93.1 kg)  11/14/22 200 lb (90.7 kg)  08/27/22 200 lb (90.7 kg)   BMI Readings from Last 3 Encounters:  01/06/23 32.15 kg/m  11/14/22 31.32 kg/m  08/27/22 31.32 kg/m    Allergies  Allergen Reactions   Bee Venom Anaphylaxis   Dicyclomine     Blurry vision   Humira [Adalimumab]     LIPS SWELLED AND RASH   Metformin And Related     rash    Medications Reviewed Today     Reviewed by Charlton Haws, RPH (Pharmacist) on 01/09/23 at 81  Med List Status: <None>   Medication Order Taking? Sig Documenting Provider Last Dose Status Informant  Accu-Chek Softclix Lancets lancets [id number] Yes Check glucose level daily and as needed for diabetes type 2  (lancets for the soft clicks lancing device AccuChek meter) Tower, Wynelle Fanny, MD Taking Active   acetaminophen (TYLENOL) 325 MG tablet [id number] Yes Take 650 mg by mouth every 6 (six) hours as needed. [provider] Taking Active Self  albuterol (PROVENTIL HFA;VENTOLIN HFA) 108 (90 Base) MCG/ACT inhaler [id number] Yes Inhale 2 puffs into the lungs every 4 (four) hours as needed for wheezing or shortness of breath. Please give spacer if possible Tower,  Wynelle Fanny, MD Taking Active Self  amLODipine (NORVASC) 5 MG tablet [id number] Yes TAKE 1 TABLET BY MOUTH  EVERY DAY AT NOON  Patient taking differently: PRN   Tower, Wynelle Fanny, MD Taking Active   atorvastatin (LIPITOR) 10 MG tablet [id number] Yes TAKE 1/2 TABLET BY MOUTH EVERY OTHER DAY Tower, Wynelle Fanny, MD Taking Active   blood glucose meter kit and supplies [id number] Yes To check glucose daily and prn for DM2 E11.9 Tower, Wynelle Fanny, MD Taking Active   dicyclomine (BENTYL) 10 MG/5ML solution [id number] No Take 1-2 mLs (2-4 mg total) by mouth 4 (four) times daily -  before meals and at bedtime. Prev with blurry vision on higher dose but not an allergy and may be able to tolerate lower dose.  Patient not taking: Reported on 01/09/2023   Abner Greenspan, MD Not Taking Active   docusate sodium (COLACE) 100 MG capsule [id number] Yes Take 1 capsule (100 mg total) by mouth 2 (two) times daily. Bonnielee Haff, MD Taking Active Self  EPINEPHrine [facility] IJ) [id number] Yes Inject as directed as needed. Reported on 12/05/2015 [provider] Taking Active Self  fluticasone (FLONASE) 50 MCG/ACT nasal spray [id number] Yes PLACE 2 SPRAYS INTO THE NOSE DAILY. Tower, Wynelle Fanny, MD Taking Active   folic acid (FOLVITE) 1 MG tablet [id number] Yes Take 1 mg by mouth daily. [provider] Taking Active Self  glucose blood (ACCU-CHEK GUIDE) test strip [id number] Yes CHECK BLOOD SUGAR DAILY Tower, Wynelle Fanny, MD Taking Active   hydroxychloroquine (PLAQUENIL) 200 MG tablet [id number] Yes Take 200 mg by mouth 2 (two) times daily. [provider] Taking Active   loratadine (CLARITIN) 10 MG tablet [id number] Yes Take 10 mg by mouth daily. MAY TAKE AT [facility] ALSO [provider] Taking Active Self  methotrexate (RHEUMATREX) 2.5 MG tablet [id number] Yes Take 15 mg by mouth once a week. Caution:Chemotherapy. Protect from light. ( Tuesday of each week ) [provider] Taking Active Self  pantoprazole (PROTONIX) 40  MG tablet [id number] Yes TAKE 1 TABLET BY MOUTH EVERY DAY AT [facility], Wynelle Fanny, MD Taking Active   polyethylene glycol [facility] / GLYCOLAX) packet [id number] Yes Take 17 g by mouth daily. [provider] Taking Active Self  predniSONE (DELTASONE) 5 MG tablet [id number] Yes Take 5 mg by mouth daily. [provider] Taking Active Self            SDOH:  (Social Determinants of Health) assessments and interventions performed: No SDOH Interventions    Flowsheet Row Clinical Support from 11/14/2022 in [place] at [facility] Management from 12/04/2020 in [place] at [place] from 11/09/2020 in [place] [facility] from 01/30/2020 in [place] at [facility] from 10/23/2017 in [place] at [place] Interventions Intervention Not Indicated -- -- -- --  Housing Interventions Intervention Not Indicated -- -- -- --  Transportation Interventions Intervention Not Indicated -- -- -- --  Utilities Interventions Intervention Not Indicated -- -- -- --  Depression Interventions/Treatment  -- -- PHQ2-9 Score <4 Follow-up Not Indicated PHQ2-9 Score <4 Follow-up Not Indicated --  [referral to PCP]  Financial Strain Interventions Intervention Not Indicated  [Medications affordable] Intervention Not Indicated  [Medications affordable] -- -- --  Physical Activity Interventions Intervention Not Indicated -- -- -- --  Stress Interventions Intervention Not Indicated -- -- -- --  Social Connections Interventions Intervention Not Indicated -- -- -- --  Medication Assistance: None required.  Patient affirms current coverage meets needs.  Medication Access: Within the past 30 days, how often has patient missed a dose of medication? 0 Is a pillbox or other method used to  improve adherence? No  Factors that may affect medication adherence? lack of understanding of disease management Are meds synced by current pharmacy? No  Are meds delivered by current pharmacy? No  Does patient experience delays in picking up medications due to transportation concerns? No   [facility] Services Reviewed: Is patient disadvantaged to use [facility]?: No  Current Rx insurance plan: [employer] Name and location of Current pharmacy:  [id number] [place], [address] [place] [address] Phone: [phone] Fax: [phone]  [facility] services reviewed with patient today?: No  Patient requests to transfer care to [facility] Pharmacy?: No  Reason patient declined to change pharmacies: Not mentioned at this visit  Compliance/Adherence/Medication fill history: Care Gaps: Mammogram (due 10/2018) - scheduled 01/27/23 Eye exam (due 11/2022)  Star-Rating Drugs: Atorvastatin - PDC 98%    ASSESSMENT / PLAN  Hypertension (BP goal <140/90) -Controlled - BP at goal in recent OV, pt is not taking amlodipine consistently, only when she feels like BP is high, she is not checking BP very often and cannot recall any recent BP readings -Pt was prescribed chorthalidone in November, she does not recognize name and cannot find bottle, original Rx was for #15 tablets with 0 RF so it is very unlikely that patient has any on hand - removed chlorthalidone from list -Home BP: none available -Current treatment: Amlodipine 5 mg daily - Appropriate, Effective, Safe, Accessible Chlorthalidone 12.5 mg daily - LF 08/27/22 x 30 ds -Medications previously tried: n/a  -Educated on BP goals and benefits of medications for prevention of heart attack, stroke and kidney damage; -Recommend to continue current medication; advised to take amlodipine daily and monitor BP  Hyperlipidemia: (LDL goal < 70) -Controlled - LDL 64 (12/2021) at goal -Hx aortic  atherosclerosis -Current treatment: Atorvastatin 10 mg - 1/2 tab QOD - Appropriate, Effective, Safe, Accessible -Medications previously tried: n/a  -Educated on Cholesterol goals; Benefits of statin for ASCVD risk reduction; -Recommended to continue current medication  Diabetes (A1c goal <7%) -Controlled - A1c 6.8% (12/2021);  -Current home glucose readings - unable to check due to trouble with lancing device fasting glucose: 92-107 post prandial glucose: n/a -Denies hypoglycemic/hyperglycemic symptoms -Current medications: Accu Chek supplies -Medications previously tried: metformin (rash) -Educated on A1c and blood sugar goals;Benefits of routine self-monitoring of blood sugar; -Counseled to check BG once daily; keep log for upcoming PCP appt  RA (Goal: mange symptoms) -Controlled -Current treatment  Prednisone 5 mg daily - Appropriate, Effective, Safe, Accessible Methotrexate 15 mg weekly -Appropriate, Effective, Safe, Accessible Hydroxychloroquine 200 mg BID - Appropriate, Effective, Safe, Accessible Tylenol 500 mg PRN -Appropriate, Effective, Safe, Accessible -Medications previously tried: n/a  -Discussed prednisone can increase BG and BP -Recommended to continue current medication  Health Maintenance -Vaccine gaps: TDAP, Shingrix #2 -GERD: on pantoprazole   Charlene Brooke, PharmD, Para March, CPP Clinical Pharmacist Practitioner [place] at [facility] [phone]

## 2023-01-21 ENCOUNTER — Other Ambulatory Visit (INDEPENDENT_AMBULATORY_CARE_PROVIDER_SITE_OTHER): Payer: Medicare Other

## 2023-01-21 DIAGNOSIS — I10 Essential (primary) hypertension: Secondary | ICD-10-CM

## 2023-01-21 LAB — BASIC METABOLIC PANEL
BUN: 10 mg/dL (ref 6–23)
CO2: 27 mEq/L (ref 19–32)
Calcium: 8.9 mg/dL (ref 8.4–10.5)
Chloride: 102 mEq/L (ref 96–112)
Creatinine, Ser: 0.79 mg/dL (ref 0.40–1.20)
GFR: 70.5 mL/min (ref >60.00)
Glucose, Bld: 126 mg/dL — ABNORMAL HIGH (ref 70–99)
Potassium: 3.5 mEq/L (ref 3.5–5.1)
Sodium: 139 mEq/L (ref 135–145)

## 2023-01-27 ENCOUNTER — Telehealth: Payer: Self-pay | Admitting: Family Medicine

## 2023-01-27 ENCOUNTER — Ambulatory Visit
Admission: RE | Admit: 2023-01-27 | Discharge: 2023-01-27 | Disposition: A | Discharge status: Home / Self Care | Payer: Medicare Other | Source: Ambulatory Visit | Attending: Family Medicine | Admitting: Family Medicine

## 2023-01-27 DIAGNOSIS — N644 Mastodynia: Secondary | ICD-10-CM | POA: Diagnosis not present

## 2023-01-27 DIAGNOSIS — R922 Inconclusive mammogram: Secondary | ICD-10-CM | POA: Diagnosis not present

## 2023-01-27 NOTE — Telephone Encounter (Signed)
Printed and placed on your desk for review

## 2023-01-27 NOTE — Telephone Encounter (Signed)
Form faxed

## 2023-01-27 NOTE — Telephone Encounter (Signed)
Done and in IN box 

## 2023-01-27 NOTE — Telephone Encounter (Signed)
[facility] faxed over STAT ppw for Tower to comp. Sent to S drive for Tower's completion.

## 2023-01-28 ENCOUNTER — Ambulatory Visit (INDEPENDENT_AMBULATORY_CARE_PROVIDER_SITE_OTHER): Payer: Medicare Other | Admitting: Family Medicine

## 2023-01-28 ENCOUNTER — Encounter: Payer: Self-pay | Admitting: Family Medicine

## 2023-01-28 VITALS — BP 148/75 | HR 77 | Temp 97.8°F | Ht 67.0 in | Wt 206.0 lb

## 2023-01-28 DIAGNOSIS — I2584 Coronary atherosclerosis due to calcified coronary lesion: Secondary | ICD-10-CM

## 2023-01-28 DIAGNOSIS — E1169 Type 2 diabetes mellitus with other specified complication: Secondary | ICD-10-CM | POA: Diagnosis not present

## 2023-01-28 DIAGNOSIS — I700 Atherosclerosis of aorta: Secondary | ICD-10-CM

## 2023-01-28 DIAGNOSIS — I10 Essential (primary) hypertension: Secondary | ICD-10-CM | POA: Diagnosis not present

## 2023-01-28 DIAGNOSIS — N644 Mastodynia: Secondary | ICD-10-CM

## 2023-01-28 DIAGNOSIS — E785 Hyperlipidemia, unspecified: Secondary | ICD-10-CM | POA: Diagnosis not present

## 2023-01-28 DIAGNOSIS — I2510 Atherosclerotic heart disease of native coronary artery without angina pectoris: Secondary | ICD-10-CM

## 2023-01-28 DIAGNOSIS — E119 Type 2 diabetes mellitus without complications: Secondary | ICD-10-CM | POA: Diagnosis not present

## 2023-01-28 DIAGNOSIS — M069 Rheumatoid arthritis, unspecified: Secondary | ICD-10-CM | POA: Diagnosis not present

## 2023-01-28 MED ORDER — CHLORTHALIDONE 25 MG PO TABS: 12.5000 mg | ORAL_TABLET | Freq: Every day | ORAL | 0 refills | Status: DC

## 2023-01-28 NOTE — Assessment & Plan Note (Signed)
Disc goals for lipids and reasons to control them Rev last labs with pt Rev low sat fat diet in detail LDL at goal (64) last check Plan to continue atorvastatin 5 mg daily  

## 2023-01-28 NOTE — Assessment & Plan Note (Signed)
Med management No clinical changes No angina  Disc imp of more aggressive bp control Is on statin

## 2023-01-28 NOTE — Assessment & Plan Note (Signed)
Continues to struggle  Per pt pain keeps her from self care Takes mtx, prednisone, folic acid and plaquenil  Continues rheum care

## 2023-01-28 NOTE — Assessment & Plan Note (Signed)
Continues low dose prednisone for arthritis Enc low glycemic diet   Will check A1c at next visit - 10-14 days   Watching bp  Will disc eye exam at that time also

## 2023-01-28 NOTE — Patient Instructions (Signed)
We need to get your blood pressure under control  to prevent vascular disease / heart problems/ stroke and dementia     Eat healthy  Avoid processed foods and excess sodium   Take the amlodipine 5 mg EVERY DAY  Add chlorthalidone 12.5 mg (1/2 pill of the 25 mg) every morning   It will make you urine more right after you take it   Follow up here in 10-14 days for a blood pressure check and labs (we will do labs that day)

## 2023-01-28 NOTE — Assessment & Plan Note (Signed)
Breast imaging was normal

## 2023-01-28 NOTE — Assessment & Plan Note (Signed)
BP: (!) 148/75  Pt is only taking her amlodipine 3 d per week (cannot tell my why, does not have side eff)  Was briefly on chlorthalidone as well   Long disc today re: risks of untx HTN- see AVS Convinced pt to take medicine regularly and to use a pre filled days of week pill box Also to continue f/u with our pharmacist Lillia Abed  Will plan to take Amlodipine 5 mg EVERY day  Add chlorthalidone 12.5 mg (1/2 of 25) daily in am Disc poss side eff  Follow up in 10-14 d - will have visit and do labs that day  Enc diet low in sodium/processed foods  Exercise is nearly impossible given RA She is low dose prednisone also

## 2023-01-28 NOTE — Progress Notes (Addendum)
Subjective:    Patient ID: [name], female    DOB: 17-Apr-1941, 82 y.o.   MRN: [id number]  HPI Pt presents for f/u of HTN  Wt Readings from Last 3 Encounters:  01/28/23 206 lb (93.4 kg)  01/06/23 205 lb 4 oz (93.1 kg)  11/14/22 200 lb (90.7 kg)   32.26 kg/m  Vitals:   01/28/23 1113  BP: (!) 150/82  Pulse: 77  Temp: 97.8 F (36.6 C)  SpO2: 98%     Had her breast imaging for pain- was all normal  Reassuring   HTN BP Readings from Last 3 Encounters:  01/28/23 (!) 148/75  01/06/23 139/70  08/27/22 (!) 150/78    Pulse Readings from Last 3 Encounters:  01/28/23 77  01/06/23 91  08/27/22 (!) 59    Takes amlodipine 5 mg  She decided not to take it every day  - took about 3 days per week  No side effects   Takes too many pills  Gets confused  Did phone visit with Lillia Abed last month   Consumed by her arthritis care  RA is much worse - can barely move  Is hard to care about anything else   Methotrexate Also low dose prednisone  Also plaquenil    We added chlorhtlidone 12.5 mg  Last metabolic panel Lab Results  Component Value Date   GLUCOSE 126 (H) 01/21/2023   NA 139 01/21/2023   K 3.5 01/21/2023   CL 102 01/21/2023   CO2 27 01/21/2023   BUN 10 01/21/2023   CREATININE 0.79 01/21/2023   GFRNONAA >60 01/26/2017   CALCIUM 8.9 01/21/2023   PHOS 2.8 10/16/2008   PROT 7.4 01/17/2022   ALBUMIN 3.9 01/17/2022   BILITOT 0.4 01/17/2022   ALKPHOS 57 01/17/2022   AST 17 01/17/2022   ALT 6 01/17/2022   ANIONGAP 12 01/26/2017   GFR 70.5    DM2 Lab Results  Component Value Date   HGBA1C 6.6 (A) 07/22/2022   Prednisone for RA Metformin causes rash  Last eye exam     Lab Results  Component Value Date   MICROALBUR 3.0 (H) 10/07/2022   MICROALBUR 0.9 10/29/2018    Patient Active Problem List   Diagnosis Date Noted   Breast pain, left 01/06/2023   Contusion of right great toe with damage to nail 01/06/2023   Heart murmur  08/27/2022   Mobility impaired 07/22/2022   Current use of proton pump inhibitor 10/01/2021   General weakness 10/01/2021   Poor balance 10/01/2021   On prednisone therapy 10/01/2021   Controlled type 2 diabetes mellitus without complication, without long-term current use of insulin 11/13/2020   Hyperlipidemia associated with type 2 diabetes mellitus 10/15/2020   Pre-op exam 06/01/2020   Fatigue 01/30/2020   TMJ (dislocation of temporomandibular joint) 04/29/2019   Encounter for screening mammogram for breast cancer 10/27/2017   Routine general medical examination at a health care facility 10/27/2017   Generalized abdominal pain 03/31/2017   Aortic atherosclerosis 02/03/2017   RA (rheumatoid arthritis)    Adjustment disorder with mixed anxiety and depressed mood 06/24/2016   Essential hypertension 01/07/2016   Coronary atherosclerosis 11/07/2015   Osteoarthritis 05/10/2014   GERD 07/09/2007   Past Medical History:  Diagnosis Date   Allergic rhinitis    Cataract 2015   bilateral   COVID-19 10/2019   ASYMPTOMATIC   Diabetes mellitus without complication    Diverticulosis    Esophageal reflux    Essential hypertension 01/07/2016  Fatty liver 2018   Fibula fracture 02/2005   Former smoker    Gastritis 2003   Heart murmur    MILD, NO CARDIOLOGIST   Hemorrhoids    History of cardiac dysrhythmia 2011   ABLATION  AT [facility]   History of kidney stones YRS AGO   Hyperglycemia    Hyperlipemia    Osteoarthritis    Personal history of colonic polyps 1998   hyperplastic   PMB (postmenopausal bleeding)    Pre-diabetes    BORDERLINE DIET CONTROLLED DOES NOT CHECK CBG   RA (rheumatoid arthritis)    Rectal bleed 2018   TRANSFUSION GIVEN   Shingles 2012   Uterine fibroid    Past Surgical History:  Procedure Laterality Date   CHOLECYSTECTOMY  YRS AGO   COLONOSCOPY  11/08   internal hemorrhoids   DILATATION & CURETTAGE/HYSTEROSCOPY WITH MYOSURE N/A 07/06/2020   Procedure:  DILATATION & CURETTAGE/HYSTEROSCOPY;  Surgeon: Theresia MajorsKendall, Jeffrey, MD;  [facility] ;  Service: Gynecology;  Laterality: N/A;  [place] [facility] [place]   EP procedure  1994   SVT; normal echo   ESOPHAGOGASTRODUODENOSCOPY  11/03   reactive gastropathy   EYE SURGERY Bilateral 2017   CATARACTS   LIPOMA EXCISION  11/2002   SVT s/p surgery--? ablation  2011   AT [facility]   TUBAL LIGATION  YRS AGO   vaginal polyp excised  12/2000   benign   Social History   Tobacco Use   Smoking status: Former    Packs/day: 0.75    Years: 15.00    Additional pack years: 0.00    Total pack years: 11.25    Types: Cigarettes    Quit date: 10/21/2003    Years since quitting: 19.2   Smokeless tobacco: Never  Vaping Use   Vaping Use: Never used  Substance Use Topics   Alcohol use: No    Alcohol/week: 0.0 standard drinks of alcohol   Drug use: No   Family History  Problem Relation Age of Onset   Colon cancer Father 6455   Diabetes Mother    Heart disease Mother    Kidney disease Mother        ESRD   Stroke Mother    Hypertension Brother    Breast cancer Sister 30   Diabetes Other        nephews   Colon cancer Sister    Esophageal cancer Neg Hx    Stomach cancer Neg Hx    Rectal cancer Neg Hx    Allergies  Allergen Reactions   Bee Venom Anaphylaxis   Dicyclomine     Blurry vision   Humira [Adalimumab]     LIPS SWELLED AND RASH   Metformin And Related     rash   Current Outpatient Medications on File Prior to Visit  Medication Sig Dispense Refill   Accu-Chek Softclix Lancets lancets Check glucose level daily and as needed for diabetes type 2  (lancets for the soft clicks lancing device AccuChek meter) 100 each 3   acetaminophen (TYLENOL) 325 MG tablet Take 650 mg by mouth every 6 (six) hours as needed.     albuterol (PROVENTIL HFA;VENTOLIN HFA) 108 (90 Base) MCG/ACT inhaler Inhale 2 puffs into the lungs every 4 (four) hours as  needed for wheezing or shortness of breath. Please give spacer if possible 1 Inhaler 3   amLODipine (NORVASC) 5 MG tablet TAKE 1 TABLET BY MOUTH EVERY DAY AT NOON (Patient  taking differently: Take 5 mg by mouth daily.) 90 tablet 0   atorvastatin (LIPITOR) 10 MG tablet TAKE 1/2 TABLET BY MOUTH EVERY OTHER DAY 22 tablet 0   blood glucose meter kit and supplies To check glucose daily and prn for DM2 E11.9 1 each 0   dicyclomine (BENTYL) 10 MG/5ML solution Take 1-2 mLs (2-4 mg total) by mouth 4 (four) times daily -  before meals and at bedtime. Prev with blurry vision on higher dose but not an allergy and may be able to tolerate lower dose. 100 mL 3   docusate sodium (COLACE) 100 MG capsule Take 1 capsule (100 mg total) by mouth 2 (two) times daily. 60 capsule 0   EPINEPHrine (EPIPEN IJ) Inject as directed as needed. Reported on 12/05/2015     fluticasone (FLONASE) 50 MCG/ACT nasal spray PLACE 2 SPRAYS INTO THE NOSE DAILY. 48 g 3   folic acid (FOLVITE) 1 MG tablet Take 1 mg by mouth daily.     glucose blood (ACCU-CHEK GUIDE) test strip CHECK BLOOD SUGAR DAILY 100 strip 1   hydroxychloroquine (PLAQUENIL) 200 MG tablet Take 200 mg by mouth 2 (two) times daily.     loratadine (CLARITIN) 10 MG tablet Take 10 mg by mouth daily. MAY TAKE AT HS ALSO     methotrexate (RHEUMATREX) 2.5 MG tablet Take 15 mg by mouth once a week. Caution:Chemotherapy. Protect from light. ( Tuesday of each week )     pantoprazole (PROTONIX) 40 MG tablet TAKE 1 TABLET BY MOUTH EVERY DAY AT NOON 90 tablet 1   polyethylene glycol (MIRALAX / GLYCOLAX) packet Take 17 g by mouth daily.     predniSONE (DELTASONE) 5 MG tablet Take 5 mg by mouth daily.     No current facility-administered medications on file prior to visit.     Review of Systems  Constitutional:  Positive for fatigue. Negative for activity change, appetite change, fever and unexpected weight change.  HENT:  Negative for congestion, ear pain, rhinorrhea, sinus pressure  and sore throat.   Eyes:  Negative for pain, redness and visual disturbance.  Respiratory:  Negative for cough, shortness of breath and wheezing.   Cardiovascular:  Negative for chest pain and palpitations.  Gastrointestinal:  Negative for abdominal pain, blood in stool, constipation and diarrhea.  Endocrine: Negative for polydipsia and polyuria.  Genitourinary:  Negative for dysuria, frequency and urgency.  Musculoskeletal:  Positive for arthralgias, back pain and joint swelling. Negative for myalgias.  Skin:  Negative for pallor and rash.  Allergic/Immunologic: Negative for environmental allergies.  Neurological:  Negative for dizziness, syncope and headaches.  Hematological:  Negative for adenopathy. Does not bruise/bleed easily.  Psychiatric/Behavioral:  Negative for decreased concentration and dysphoric mood. The patient is not nervous/anxious.        Objective:   Physical Exam Constitutional:      General: She is not in acute distress.    Appearance: Normal appearance. She is well-developed. She is obese. She is not ill-appearing or diaphoretic.  HENT:     Head: Normocephalic and atraumatic.  Eyes:     Conjunctiva/sclera: Conjunctivae normal.     Pupils: Pupils are equal, round, and reactive to light.  Neck:     Thyroid: No thyromegaly.     Vascular: No carotid bruit or JVD.  Cardiovascular:     Rate and Rhythm: Normal rate and regular rhythm.     Heart sounds: Normal heart sounds.     No gallop.  Pulmonary:  Effort: Pulmonary effort is normal. No respiratory distress.     Breath sounds: Normal breath sounds. No wheezing or rales.  Abdominal:     General: There is no distension or abdominal bruit.     Palpations: Abdomen is soft.  Musculoskeletal:     Cervical back: Normal range of motion and neck supple.     Right lower leg: No edema.     Left lower leg: No edema.     Comments: Joint changes of RA    Lymphadenopathy:     Cervical: No cervical adenopathy.   Skin:    General: Skin is warm and dry.     Coloration: Skin is not jaundiced or pale.     Findings: No bruising or rash.  Neurological:     Mental Status: She is alert.     Coordination: Coordination normal.     Deep Tendon Reflexes: Reflexes are normal and symmetric. Reflexes normal.  Psychiatric:        Mood and Affect: Mood normal.           Assessment & Plan:   Problem List Items Addressed This Visit       Cardiovascular and Mediastinum   Aortic atherosclerosis    No symptoms  Trying to be more aggressive with bp control  Atorvastatin for lipids       Relevant Medications   chlorthalidone (HYGROTON) 25 MG tablet   Coronary atherosclerosis    Med management No clinical changes No angina  Disc imp of more aggressive bp control Is on statin        Relevant Medications   chlorthalidone (HYGROTON) 25 MG tablet   Essential hypertension - Primary    BP: (!) 148/75  Pt is only taking her amlodipine 3 d per week (cannot tell my why, does not have side eff)  Was briefly on chlorthalidone as well   Long disc today re: risks of untx HTN- see AVS Convinced pt to take medicine regularly and to use a pre filled days of week pill box Also to continue f/u with our pharmacist Lillia Abed  Will plan to take Amlodipine 5 mg EVERY day  Add chlorthalidone 12.5 mg (1/2 of 25) daily in am Disc poss side eff  Follow up in 10-14 d - will have visit and do labs that day  Enc diet low in sodium/processed foods  Exercise is nearly impossible given RA She is low dose prednisone also      Relevant Medications   chlorthalidone (HYGROTON) 25 MG tablet     Endocrine   Controlled type 2 diabetes mellitus without complication, without long-term current use of insulin    Continues low dose prednisone for arthritis Enc low glycemic diet   Will check A1c at next visit - 10-14 days   Watching bp  Will disc eye exam at that time also         Musculoskeletal and Integument   RA  (rheumatoid arthritis)    Continues to struggle  Per pt pain keeps her from self care Takes mtx, prednisone, folic acid and plaquenil  Continues rheum care         Other   Breast pain, left    Breast imaging was normal

## 2023-01-28 NOTE — Assessment & Plan Note (Signed)
No symptoms  Trying to be more aggressive with bp control  Atorvastatin for lipids

## 2023-02-05 ENCOUNTER — Telehealth: Payer: Self-pay | Admitting: Family Medicine

## 2023-02-05 NOTE — Telephone Encounter (Signed)
Pt called stating her husband pulled a tick off her leg & now has a spot on her leg from the bite. Pt is asking for any suggestions on what to apply on leg? Call back # [phone]

## 2023-02-05 NOTE — Telephone Encounter (Signed)
That red area is big enough to get checked out  Please schedule appt with first available tomorrow  If worse or fever- go to [facility]

## 2023-02-05 NOTE — Telephone Encounter (Signed)
Called pt to get more info. Pt said when pulled tick off it was still small and wasn't engorged. Pt said she usually has reactions to most bug bites. Pt said the place on her leg is red and slightly swollen. The red and swollen area is about the size of the palm of her hand. Pt said it's slightly painful and itchy. Pt's spouse pulled the tick off on Monday. Pt said besides the spot on her leg that's swollen she has no other sxs. No fever, no new fatigue, no rashes, no joint pain or body aches. She just thinks it's an allergic reaction to being bit.

## 2023-02-06 ENCOUNTER — Ambulatory Visit (INDEPENDENT_AMBULATORY_CARE_PROVIDER_SITE_OTHER): Payer: Medicare Other | Admitting: Family Medicine

## 2023-02-06 ENCOUNTER — Encounter: Payer: Self-pay | Admitting: Family Medicine

## 2023-02-06 VITALS — BP 126/64 | HR 88 | Temp 98.6°F | Ht 67.0 in | Wt 203.0 lb

## 2023-02-06 DIAGNOSIS — W57XXXA Bitten or stung by nonvenomous insect and other nonvenomous arthropods, initial encounter: Secondary | ICD-10-CM

## 2023-02-06 DIAGNOSIS — E119 Type 2 diabetes mellitus without complications: Secondary | ICD-10-CM

## 2023-02-06 DIAGNOSIS — I10 Essential (primary) hypertension: Secondary | ICD-10-CM | POA: Diagnosis not present

## 2023-02-06 DIAGNOSIS — Z23 Encounter for immunization: Secondary | ICD-10-CM

## 2023-02-06 DIAGNOSIS — S50861A Insect bite (nonvenomous) of right forearm, initial encounter: Secondary | ICD-10-CM | POA: Insufficient documentation

## 2023-02-06 DIAGNOSIS — S70361A Insect bite (nonvenomous), right thigh, initial encounter: Secondary | ICD-10-CM

## 2023-02-06 MED ORDER — DOXYCYCLINE HYCLATE 100 MG PO TABS
100.0000 mg | ORAL_TABLET | Freq: Two times a day (BID) | ORAL | 0 refills | Status: DC
Start: 2023-02-06 — End: 2023-03-02

## 2023-02-06 NOTE — Patient Instructions (Signed)
Keep the tick bite area clean with soap and water  A cool compress may help  Let us know if it does not continue to improve   Take the doxycycline for tick bite/skin infection   Lab today for sugar/ chem/ and cholesterol   Your blood pressure looks much better   Take care of yourself

## 2023-02-06 NOTE — Progress Notes (Unsigned)
Subjective:    Patient ID: [name], female    DOB: 03-30-1941, 82 y.o.   MRN: [id number]  HPI Pt presents for insect bite on R thigh    Wt Readings from Last 3 Encounters:  02/06/23 203 lb (92.1 kg)  01/28/23 206 lb (93.4 kg)  01/06/23 205 lb 4 oz (93.1 kg)   31.79 kg/m  Vitals:   02/06/23 1555  BP: 126/64  Pulse: 88  Temp: 98.6 F (37 C)  SpO2: 97%   Tuesday she noticed a tick on her leg  R thigh  Itchy  Husband pulled off - no problems getting it all out  Thinks it was a dog tick based on size   Then spot got red and inflamed that night   Put some cream on it   Still pretty red today  Itchy - trying not to scratch   No new rashes  No fever  No St  No headache   Lab Results  Component Value Date   HGBA1C 6.6 (A) 07/22/2022   Patient Active Problem List   Diagnosis Date Noted   Tick bite of right thigh 02/06/2023   Breast pain, left 01/06/2023   Contusion of right great toe with damage to nail 01/06/2023   Heart murmur 08/27/2022   Mobility impaired 07/22/2022   Current use of proton pump inhibitor 10/01/2021   General weakness 10/01/2021   Poor balance 10/01/2021   On prednisone therapy 10/01/2021   Controlled type 2 diabetes mellitus without complication, without long-term current use of insulin 11/13/2020   Hyperlipidemia associated with type 2 diabetes mellitus 10/15/2020   Pre-op exam 06/01/2020   Fatigue 01/30/2020   TMJ (dislocation of temporomandibular joint) 04/29/2019   Encounter for screening mammogram for breast cancer 10/27/2017   Routine general medical examination at a health care facility 10/27/2017   Generalized abdominal pain 03/31/2017   Aortic atherosclerosis 02/03/2017   RA (rheumatoid arthritis)    Adjustment disorder with mixed anxiety and depressed mood 06/24/2016   Essential hypertension 01/07/2016   Coronary atherosclerosis 11/07/2015   Osteoarthritis 05/10/2014   GERD 07/09/2007   Past Medical History:   Diagnosis Date   Allergic rhinitis    Cataract 2015   bilateral   COVID-19 10/2019   ASYMPTOMATIC   Diabetes mellitus without complication    Diverticulosis    Esophageal reflux    Essential hypertension 01/07/2016   Fatty liver 2018   Fibula fracture 02/2005   Former smoker    Gastritis 2003   Heart murmur    MILD, NO CARDIOLOGIST   Hemorrhoids    History of cardiac dysrhythmia 2011   ABLATION  AT [facility]   History of kidney stones YRS AGO   Hyperglycemia    Hyperlipemia    Osteoarthritis    Personal history of colonic polyps 1998   hyperplastic   PMB (postmenopausal bleeding)    Pre-diabetes    BORDERLINE DIET CONTROLLED DOES NOT CHECK CBG   RA (rheumatoid arthritis)    Rectal bleed 2018   TRANSFUSION GIVEN   Shingles 2012   Uterine fibroid    Past Surgical History:  Procedure Laterality Date   CHOLECYSTECTOMY  YRS AGO   COLONOSCOPY  11/08   internal hemorrhoids   DILATATION & CURETTAGE/HYSTEROSCOPY WITH MYOSURE N/A 07/06/2020   Procedure: DILATATION & CURETTAGE/HYSTEROSCOPY;  Surgeon: Theresia Majors, MD;  Location: [facility];  Service: Gynecology;  Laterality: N/A;  request 7:30am OR time in [place] [facility] block requests 30 minutes  EP procedure  1994   SVT; normal echo   ESOPHAGOGASTRODUODENOSCOPY  11/03   reactive gastropathy   EYE SURGERY Bilateral 2017   CATARACTS   LIPOMA EXCISION  11/2002   SVT s/p surgery--? ablation  2011   AT [facility]   TUBAL LIGATION  YRS AGO   vaginal polyp excised  12/2000   benign   Social History   Tobacco Use   Smoking status: Former    Packs/day: 0.75    Years: 15.00    Additional pack years: 0.00    Total pack years: 11.25    Types: Cigarettes    Quit date: 10/21/2003    Years since quitting: 19.3   Smokeless tobacco: Never  Vaping Use   Vaping Use: Never used  Substance Use Topics   Alcohol use: No    Alcohol/week: 0.0 standard drinks of alcohol   Drug use: No   Family History  Problem  Relation Age of Onset   Colon cancer Father 2   Diabetes Mother    Heart disease Mother    Kidney disease Mother        ESRD   Stroke Mother    Hypertension Brother    Breast cancer Sister 30   Diabetes Other        nephews   Colon cancer Sister    Esophageal cancer Neg Hx    Stomach cancer Neg Hx    Rectal cancer Neg Hx    Allergies  Allergen Reactions   Bee Venom Anaphylaxis   Dicyclomine     Blurry vision   Humira [Adalimumab]     LIPS SWELLED AND RASH   Metformin And Related     rash   Current Outpatient Medications on File Prior to Visit  Medication Sig Dispense Refill   Accu-Chek Softclix Lancets lancets Check glucose level daily and as needed for diabetes type 2  (lancets for the soft clicks lancing device AccuChek meter) 100 each 3   acetaminophen (TYLENOL) 325 MG tablet Take 650 mg by mouth every 6 (six) hours as needed.     albuterol (PROVENTIL HFA;VENTOLIN HFA) 108 (90 Base) MCG/ACT inhaler Inhale 2 puffs into the lungs every 4 (four) hours as needed for wheezing or shortness of breath. Please give spacer if possible 1 Inhaler 3   amLODipine (NORVASC) 5 MG tablet TAKE 1 TABLET BY MOUTH EVERY DAY AT NOON (Patient taking differently: Take 5 mg by mouth daily.) 90 tablet 0   atorvastatin (LIPITOR) 10 MG tablet TAKE 1/2 TABLET BY MOUTH EVERY OTHER DAY 22 tablet 0   blood glucose meter kit and supplies To check glucose daily and prn for DM2 E11.9 1 each 0   chlorthalidone (HYGROTON) 25 MG tablet Take 0.5 tablets (12.5 mg total) by mouth daily. 45 tablet 0   dicyclomine (BENTYL) 10 MG/5ML solution Take 1-2 mLs (2-4 mg total) by mouth 4 (four) times daily -  before meals and at bedtime. Prev with blurry vision on higher dose but not an allergy and may be able to tolerate lower dose. 100 mL 3   docusate sodium (COLACE) 100 MG capsule Take 1 capsule (100 mg total) by mouth 2 (two) times daily. 60 capsule 0   EPINEPHrine (EPIPEN IJ) Inject as directed as needed. Reported on  12/05/2015     fluticasone (FLONASE) 50 MCG/ACT nasal spray PLACE 2 SPRAYS INTO THE NOSE DAILY. 48 g 3   folic acid (FOLVITE) 1 MG tablet Take 1 mg by mouth daily.  glucose blood (ACCU-CHEK GUIDE) test strip CHECK BLOOD SUGAR DAILY 100 strip 1   hydroxychloroquine (PLAQUENIL) 200 MG tablet Take 200 mg by mouth 2 (two) times daily.     loratadine (CLARITIN) 10 MG tablet Take 10 mg by mouth daily. MAY TAKE AT HS ALSO     methotrexate (RHEUMATREX) 2.5 MG tablet Take 15 mg by mouth once a week. Caution:Chemotherapy. Protect from light. ( Tuesday of each week )     pantoprazole (PROTONIX) 40 MG tablet TAKE 1 TABLET BY MOUTH EVERY DAY AT NOON 90 tablet 1   polyethylene glycol (MIRALAX / GLYCOLAX) packet Take 17 g by mouth daily.     predniSONE (DELTASONE) 5 MG tablet Take 5 mg by mouth daily.     No current facility-administered medications on file prior to visit.    Review of Systems  Constitutional:  Negative for activity change, appetite change, fatigue, fever and unexpected weight change.  HENT:  Negative for congestion, ear pain, rhinorrhea, sinus pressure and sore throat.   Eyes:  Negative for pain, redness and visual disturbance.  Respiratory:  Negative for cough, shortness of breath and wheezing.   Cardiovascular:  Negative for chest pain and palpitations.  Gastrointestinal:  Negative for abdominal pain, blood in stool, constipation and diarrhea.  Endocrine: Negative for polydipsia and polyuria.  Genitourinary:  Negative for dysuria, frequency and urgency.  Musculoskeletal:  Negative for arthralgias, back pain and myalgias.  Skin:  Positive for rash. Negative for pallor.  Allergic/Immunologic: Negative for environmental allergies.  Neurological:  Negative for dizziness, syncope and headaches.  Hematological:  Negative for adenopathy. Does not bruise/bleed easily.  Psychiatric/Behavioral:  Negative for decreased concentration and dysphoric mood. The patient is not nervous/anxious.         Objective:   Physical Exam Constitutional:      General: She is not in acute distress.    Appearance: Normal appearance. She is well-developed. She is obese. She is not ill-appearing or diaphoretic.  HENT:     Head: Normocephalic and atraumatic.  Eyes:     Conjunctiva/sclera: Conjunctivae normal.     Pupils: Pupils are equal, round, and reactive to light.  Neck:     Thyroid: No thyromegaly.     Vascular: No carotid bruit or JVD.  Cardiovascular:     Rate and Rhythm: Normal rate and regular rhythm.     Heart sounds: Normal heart sounds.     No gallop.  Pulmonary:     Effort: Pulmonary effort is normal. No respiratory distress.     Breath sounds: Normal breath sounds. No wheezing or rales.  Abdominal:     General: There is no distension or abdominal bruit.     Palpations: Abdomen is soft.  Musculoskeletal:     Cervical back: Normal range of motion and neck supple.     Right lower leg: No edema.     Left lower leg: No edema.     Comments: Baseline arthritis joint changes  Lymphadenopathy:     Cervical: No cervical adenopathy.  Skin:    General: Skin is warm and dry.     Coloration: Skin is not pale.     Findings: No rash.     Comments: Tick bite R inner thigh  No tick parts left  Large oval area of erythema and induration or excoriation  No scale or central clearing  No other rashes  Neurological:     Mental Status: She is alert.     Coordination: Coordination normal.  Deep Tendon Reflexes: Reflexes are normal and symmetric. Reflexes normal.  Psychiatric:        Mood and Affect: Mood normal.           Assessment & Plan:   Problem List Items Addressed This Visit       Cardiovascular and Mediastinum   Essential hypertension    Bp is improved   bp in fair control at this time  BP Readings from Last 1 Encounters:  02/06/23 126/64  No changes needed Will continue chlorthalidone 12.5 mg daily  Amlodipine 5 mg daily  Most recent labs reviewed ,  labs ordered today Disc lifstyle change with low sodium diet and exercise        Relevant Orders   Basic metabolic panel (Completed)   Lipid panel (Completed)     Endocrine   Controlled type 2 diabetes mellitus without complication, without long-term current use of insulin    Due for A1c  Added to labs Diet is fair  Continues low dose prednisone for arthritis       Relevant Orders   Hemoglobin A1c (Completed)     Musculoskeletal and Integument   Tick bite of right thigh - Primary    Large oval area of induration - not classic for erythema migrans but significant  (no significant central clearing) Tick was med size/not engorged / whole thing removed No fever, headache or malaise  No rashes elsewhere   Disc poss of early tick bourne problem vs cellulitis  Will tx with doxycycline 100 mg bid for 7 d  Disc s/s to watch for  Disc ER precautions Inst to keep clean with soap and water  Meds ordered this encounter  Medications   doxycycline (VIBRA-TABS) 100 MG tablet    Sig: Take 1 tablet (100 mg total) by mouth 2 (two) times daily.    Dispense:  14 tablet    Refill:  0

## 2023-02-06 NOTE — Telephone Encounter (Signed)
Spoke to patient by telephone and was advised that the red spot is still there. Patient scheduled for an appointment today 02/06/23 at 4:00 pm. Patient stated that she will call back if she can not make the appointment because of transportation.

## 2023-02-07 LAB — LIPID PANEL
Cholesterol: 143 mg/dL (ref <200)
HDL: 50 mg/dL (ref >=50)
LDL Cholesterol (Calc): 67 mg/dL (calc)
Non-HDL Cholesterol (Calc): 93 mg/dL (calc) (ref <130)
Total CHOL/HDL Ratio: 2.9 (calc) (ref <5.0)
Triglycerides: 189 mg/dL — ABNORMAL HIGH (ref <150)

## 2023-02-07 LAB — HEMOGLOBIN A1C
Hgb A1c MFr Bld: 7.2 % of total Hgb — ABNORMAL HIGH (ref <5.7)
Mean Plasma Glucose: 160 mg/dL
eAG (mmol/L): 8.9 mmol/L

## 2023-02-07 LAB — BASIC METABOLIC PANEL
BUN: 11 mg/dL (ref 7–25)
CO2: 25 mmol/L (ref 20–32)
Calcium: 9.3 mg/dL (ref 8.6–10.4)
Chloride: 102 mmol/L (ref 98–110)
Creat: 0.77 mg/dL (ref 0.60–0.95)
Glucose, Bld: 111 mg/dL — ABNORMAL HIGH (ref 65–99)
Potassium: 3.6 mmol/L (ref 3.5–5.3)
Sodium: 139 mmol/L (ref 135–146)

## 2023-02-08 NOTE — Assessment & Plan Note (Addendum)
Large oval area of induration - not classic for erythema migrans but significant  (no significant central clearing) Tick was med size/not engorged / whole thing removed No fever, headache or malaise  No rashes elsewhere   Disc poss of early tick bourne problem vs cellulitis  Will tx with doxycycline 100 mg bid for 7 d  Disc s/s to watch for  Disc ER precautions Inst to keep clean with soap and water Td updated  Meds ordered this encounter  Medications   doxycycline (VIBRA-TABS) 100 MG tablet    Sig: Take 1 tablet (100 mg total) by mouth 2 (two) times daily.    Dispense:  14 tablet    Refill:  0

## 2023-02-08 NOTE — Assessment & Plan Note (Signed)
Due for A1c  Added to labs Diet is fair  Continues low dose prednisone for arthritis

## 2023-02-08 NOTE — Assessment & Plan Note (Signed)
Bp is improved   bp in fair control at this time  BP Readings from Last 1 Encounters:  02/06/23 126/64   No changes needed Will continue chlorthalidone 12.5 mg daily  Amlodipine 5 mg daily  Most recent labs reviewed , labs ordered today Disc lifstyle change with low sodium diet and exercise

## 2023-02-09 ENCOUNTER — Telehealth: Payer: Self-pay

## 2023-02-09 NOTE — Progress Notes (Signed)
Called patient for blood pressure readings as previously discussed with Al Corpus, PharmD. Patient did not have any readings. I have asked patient to take their blood pressure daily and keep a log. Advised patient I would call back on 02/13/2023 for log. Patient verbalized understanding and agreed.    Patient did stated she is taking her Amlodipine every day now. She stated everything is going good with it.   Al Corpus, PharmD notified  Claudina Lick, [place] Clinical Pharmacy Assistant [phone]

## 2023-02-11 ENCOUNTER — Ambulatory Visit (INDEPENDENT_AMBULATORY_CARE_PROVIDER_SITE_OTHER): Payer: Medicare Other | Admitting: Family Medicine

## 2023-02-11 ENCOUNTER — Encounter: Payer: Self-pay | Admitting: Family Medicine

## 2023-02-11 VITALS — BP 135/70 | HR 92 | Temp 97.8°F | Ht 67.0 in | Wt 202.0 lb

## 2023-02-11 DIAGNOSIS — Z7984 Long term (current) use of oral hypoglycemic drugs: Secondary | ICD-10-CM

## 2023-02-11 DIAGNOSIS — S70361A Insect bite (nonvenomous), right thigh, initial encounter: Secondary | ICD-10-CM

## 2023-02-11 DIAGNOSIS — E119 Type 2 diabetes mellitus without complications: Secondary | ICD-10-CM

## 2023-02-11 DIAGNOSIS — W57XXXA Bitten or stung by nonvenomous insect and other nonvenomous arthropods, initial encounter: Secondary | ICD-10-CM

## 2023-02-11 DIAGNOSIS — Z6831 Body mass index (BMI) 31.0-31.9, adult: Secondary | ICD-10-CM

## 2023-02-11 DIAGNOSIS — E1169 Type 2 diabetes mellitus with other specified complication: Secondary | ICD-10-CM

## 2023-02-11 DIAGNOSIS — Z7952 Long term (current) use of systemic steroids: Secondary | ICD-10-CM | POA: Diagnosis not present

## 2023-02-11 DIAGNOSIS — K219 Gastro-esophageal reflux disease without esophagitis: Secondary | ICD-10-CM

## 2023-02-11 DIAGNOSIS — E785 Hyperlipidemia, unspecified: Secondary | ICD-10-CM | POA: Diagnosis not present

## 2023-02-11 DIAGNOSIS — I10 Essential (primary) hypertension: Secondary | ICD-10-CM

## 2023-02-11 DIAGNOSIS — M069 Rheumatoid arthritis, unspecified: Secondary | ICD-10-CM

## 2023-02-11 DIAGNOSIS — E6609 Other obesity due to excess calories: Secondary | ICD-10-CM | POA: Insufficient documentation

## 2023-02-11 MED ORDER — GLIPIZIDE ER 2.5 MG PO TB24: 2.5000 mg | ORAL_TABLET | Freq: Every day | ORAL | 1 refills | Status: DC

## 2023-02-11 NOTE — Assessment & Plan Note (Signed)
Bp was up a bit more today  Need to use a large cuff on her  BP: 135/70   Is taking chlorthalidone 12.5 mg daily (may inc this next time if bp is not at goal) - aware can affect glucose Continue amlodipine 5 mg daily also   Enc her to avoid processed/salty foods  F/u 1 mo   She will work on getting a larger cuff for home also  Disc how pain affects bp and noted better to check at times when she feels less stress and pain  Given handout re: proper way to check bp Her husband helps her

## 2023-02-11 NOTE — Assessment & Plan Note (Signed)
Much better with protonix. 

## 2023-02-11 NOTE — Assessment & Plan Note (Signed)
Lab Results  Component Value Date   HGBA1C 7.2 (H) 02/06/2023   This is up  Continues prednisone 5 mg which she may not be able to get off of due to severe RA Also limited exercise  Disc low glycemic diet in detail (she has appt with a nurse to discuss on Friday)  She is allergic to metformin  Disc other options for tx (she decliines glp drug at this time)  Will start glipizide xl 2.5 mg daily  and inst to check glucose bid and f/u in 1 mo with her log  Disc risk of low glucose and need to follow closely  She will continue to discuss prednisone with rheumatology  Needs eye exam- she wants to schedule that with Dr Alvester Morin but ins is not taken will call for referral Due for microalb but could not get sample today , will do next visit in 1 mo

## 2023-02-11 NOTE — Assessment & Plan Note (Signed)
Disc goals for lipids and reasons to control them Rev last labs with pt Rev low sat fat diet in detail LDL at goal (67) last check Trig up a bit poss due to inc blood glucose  Plan to continue atorvastatin 5 mg daily  Disc lean proteins to choose

## 2023-02-11 NOTE — Assessment & Plan Note (Signed)
This affects weight and blood sugar Doubt she can come off due to severe RA She will disc further at rheum office

## 2023-02-11 NOTE — Patient Instructions (Addendum)
Your diabetes is worse  I want to  Add glipizide xl 2.5 mg daily   Watch your blood sugar  Check it twice daily in am and then 2 hours after meal  If your glucose gets lower than 70 let us know ! Follow up here in a month and bring glucose log  We will check a urine sample that day also    Try to get most of your carbohydrates from produce (with the exception of white potatoes)  Eat less bread/pasta/rice/snack foods/cereals/sweets and other items from the middle of the grocery store (processed carbs)  Lean protein includes Poultry, fish (with fins), lean pork (not bacon and sausage)  Low fat dairy  Nuts and nut butters Dried beans  Soy products  Eggs     For instance for breakfast eat 1 piece of bread instead of 2   Think about getting rid of soda and sweet tea  Think about an artificial sweetener for coffee like stevia   Avoid sweets /added sugar   You are due for a diabetic eye exam  Please schedule that  If you need a referral let us know    Keep checking your blood pressure  You need to buy a new cuff - size large if you can afford to buy one  I think your home cuff is too small and is causing pain and making your readings high  Pain in general makes blood pressure higher - try to choose times to check when you are more comfortable and relaxed   The prednisone raises blood sugar and blood pressure  It you can get off it it, this will help If not= we have to work around it     Have your visit with the nurse on Friday- talk about low glycemic eating (diabetic diet /low sugar diet)

## 2023-02-11 NOTE — Progress Notes (Signed)
Subjective:    Patient ID: [name], female    DOB: 1941/05/05, 82 y.o.   MRN: [id number]  HPI Pt presents for f/u of DM, HTN and chronic health problems Also tick bite re check   Wt Readings from Last 3 Encounters:  02/11/23 202 lb (91.6 kg)  02/06/23 203 lb (92.1 kg)  01/28/23 206 lb (93.4 kg)   31.64 kg/m  Vitals:   02/11/23 1344 02/11/23 1416  BP: (!) 146/68 135/70  Pulse: 92   Temp: 97.8 F (36.6 C)   SpO2: 96%    Bp is better 2nd check   Tick bite Taking doxycycline  No fever or rash Getting better  Feels fine   HTN bp is stable today  No cp or palpitations or headaches or edema  No side effects to medicines  BP Readings from Last 3 Encounters:  02/11/23 135/70  02/06/23 126/64  01/28/23 (!) 148/75    Chlorthalidone 12.5 mg daily  Amlodipine 5 mg daily    Bp goes up at home shen she is in pain    Last metabolic panel Lab Results  Component Value Date   GLUCOSE 111 (H) 02/06/2023   NA 139 02/06/2023   K 3.6 02/06/2023   CL 102 02/06/2023   CO2 25 02/06/2023   BUN 11 02/06/2023   CREATININE 0.77 02/06/2023   GFRNONAA >60 01/26/2017   CALCIUM 9.3 02/06/2023   PHOS 2.8 10/16/2008   PROT 7.4 01/17/2022   ALBUMIN 3.9 01/17/2022   BILITOT 0.4 01/17/2022   ALKPHOS 57 01/17/2022   AST 17 01/17/2022   ALT 6 01/17/2022   ANIONGAP 12 01/26/2017     DM2 Takes chronic prednisone for DM2  Lab Results  Component Value Date   HGBA1C 7.2 (H) 02/06/2023   This is up  In past has been allergic to metformin xr   Last A1c was 6.6  This is up   Most of the time she is mindful of diet  Occ sub sandwich   She tries to avoid sugar  Avoids sweets  Drinks sugar in coffee - 1 cup per day  Occ soda -not often  Occ sweet tea  No juice   No sweets in the house   Bread - 2 slices of bread for breakfast  Occ sub sandwich -not often No rice  Pasta occas  Not a lot of white potato    Eye exam- trying to get in with Dr Alvester Morin but there  was an insurance change   Lab Results  Component Value Date   MICROALBUR 3.0 (H) 10/07/2022   MICROALBUR 0.9 10/29/2018    Exercise is limited due to pain   Did some dm ed on the phone and that is helpful  Has a visit friday  RA Prednisone  Metx And plaquenil      GERD Takes ppi -protonix  This controlled stomach problems -is better/back to normal now   Lab Results  Component Value Date   VITAMINB12 224 10/01/2021    Hyperlipidemia Lab Results  Component Value Date   CHOL 143 02/06/2023   CHOL 137 01/17/2022   CHOL 172 11/13/2020   Lab Results  Component Value Date   HDL 50 02/06/2023   HDL 46.00 01/17/2022   HDL 52.60 11/13/2020   Lab Results  Component Value Date   LDLCALC 67 02/06/2023   LDLCALC 64 01/17/2022   LDLCALC 80 11/13/2020   Lab Results  Component Value Date   TRIG 189 (H)  02/06/2023   TRIG 136.0 01/17/2022   TRIG 197.0 (H) 11/13/2020   Lab Results  Component Value Date   CHOLHDL 2.9 02/06/2023   CHOLHDL 3 01/17/2022   CHOLHDL 3 11/13/2020   Lab Results  Component Value Date   LDLDIRECT 60.0 10/26/2018   Atorvastatin 5 mg daily   Patient Active Problem List   Diagnosis Date Noted   Class 1 obesity due to excess calories with serious comorbidity and body mass index (BMI) of 31.0 to 31.9 in adult 02/11/2023   Tick bite of right thigh 02/06/2023   Breast pain, left 01/06/2023   Contusion of right great toe with damage to nail 01/06/2023   Heart murmur 08/27/2022   Mobility impaired 07/22/2022   Current use of proton pump inhibitor 10/01/2021   General weakness 10/01/2021   Poor balance 10/01/2021   On prednisone therapy 10/01/2021   Controlled type 2 diabetes mellitus without complication, without long-term current use of insulin 11/13/2020   Hyperlipidemia associated with type 2 diabetes mellitus 10/15/2020   Pre-op exam 06/01/2020   Fatigue 01/30/2020   TMJ (dislocation of temporomandibular joint) 04/29/2019   Encounter  for screening mammogram for breast cancer 10/27/2017   Routine general medical examination at a health care facility 10/27/2017   Generalized abdominal pain 03/31/2017   Aortic atherosclerosis 02/03/2017   RA (rheumatoid arthritis)    Adjustment disorder with mixed anxiety and depressed mood 06/24/2016   Essential hypertension 01/07/2016   Coronary atherosclerosis 11/07/2015   Osteoarthritis 05/10/2014   GERD 07/09/2007   Past Medical History:  Diagnosis Date   Allergic rhinitis    Cataract 2015   bilateral   COVID-19 10/2019   ASYMPTOMATIC   Diabetes mellitus without complication    Diverticulosis    Esophageal reflux    Essential hypertension 01/07/2016   Fatty liver 2018   Fibula fracture 02/2005   Former smoker    Gastritis 2003   Heart murmur    MILD, NO CARDIOLOGIST   Hemorrhoids    History of cardiac dysrhythmia 2011   ABLATION  AT [facility]   History of kidney stones YRS AGO   Hyperglycemia    Hyperlipemia    Osteoarthritis    Personal history of colonic polyps 1998   hyperplastic   PMB (postmenopausal bleeding)    Pre-diabetes    BORDERLINE DIET CONTROLLED DOES NOT CHECK CBG   RA (rheumatoid arthritis)    Rectal bleed 2018   TRANSFUSION GIVEN   Shingles 2012   Uterine fibroid    Past Surgical History:  Procedure Laterality Date   CHOLECYSTECTOMY  YRS AGO   COLONOSCOPY  11/08   internal hemorrhoids   DILATATION & CURETTAGE/HYSTEROSCOPY WITH MYOSURE N/A 07/06/2020   Procedure: DILATATION & CURETTAGE/HYSTEROSCOPY;  Surgeon: Theresia Majors, MD;  Location: [facility] [place];  Service: Gynecology;  Laterality: N/A;  request 7:30am OR time in [place] Gyn block requests 30 minutes   EP procedure  1994   SVT; normal echo   ESOPHAGOGASTRODUODENOSCOPY  11/03   reactive gastropathy   EYE SURGERY Bilateral 2017   CATARACTS   LIPOMA EXCISION  11/2002   SVT s/p surgery--? ablation  2011   AT [facility]   TUBAL LIGATION  YRS AGO   vaginal polyp excised   12/2000   benign   Social History   Tobacco Use   Smoking status: Former    Packs/day: 0.75    Years: 15.00    Additional pack years: 0.00    Total pack years: 11.25  Types: Cigarettes    Quit date: 10/21/2003    Years since quitting: 19.3   Smokeless tobacco: Never  Vaping Use   Vaping Use: Never used  Substance Use Topics   Alcohol use: No    Alcohol/week: 0.0 standard drinks of alcohol   Drug use: No   Family History  Problem Relation Age of Onset   Colon cancer Father 39   Diabetes Mother    Heart disease Mother    Kidney disease Mother        ESRD   Stroke Mother    Hypertension Brother    Breast cancer Sister 41   Diabetes Other        nephews   Colon cancer Sister    Esophageal cancer Neg Hx    Stomach cancer Neg Hx    Rectal cancer Neg Hx    Allergies  Allergen Reactions   Bee Venom Anaphylaxis   Dicyclomine     Blurry vision   Humira [Adalimumab]     LIPS SWELLED AND RASH   Metformin And Related     rash   Current Outpatient Medications on File Prior to Visit  Medication Sig Dispense Refill   Accu-Chek Softclix Lancets lancets Check glucose level daily and as needed for diabetes type 2  (lancets for the soft clicks lancing device AccuChek meter) 100 each 3   acetaminophen (TYLENOL) 325 MG tablet Take 650 mg by mouth every 6 (six) hours as needed.     albuterol (PROVENTIL HFA;VENTOLIN HFA) 108 (90 Base) MCG/ACT inhaler Inhale 2 puffs into the lungs every 4 (four) hours as needed for wheezing or shortness of breath. Please give spacer if possible 1 Inhaler 3   amLODipine (NORVASC) 5 MG tablet TAKE 1 TABLET BY MOUTH EVERY DAY AT NOON (Patient taking differently: Take 5 mg by mouth daily.) 90 tablet 0   atorvastatin (LIPITOR) 10 MG tablet TAKE 1/2 TABLET BY MOUTH EVERY OTHER DAY 22 tablet 0   blood glucose meter kit and supplies To check glucose daily and prn for DM2 E11.9 1 each 0   chlorthalidone (HYGROTON) 25 MG tablet Take 0.5 tablets (12.5 mg total)  by mouth daily. 45 tablet 0   dicyclomine (BENTYL) 10 MG/5ML solution Take 1-2 mLs (2-4 mg total) by mouth 4 (four) times daily -  before meals and at bedtime. Prev with blurry vision on higher dose but not an allergy and may be able to tolerate lower dose. 100 mL 3   docusate sodium (COLACE) 100 MG capsule Take 1 capsule (100 mg total) by mouth 2 (two) times daily. 60 capsule 0   doxycycline (VIBRA-TABS) 100 MG tablet Take 1 tablet (100 mg total) by mouth 2 (two) times daily. 14 tablet 0   EPINEPHrine (EPIPEN IJ) Inject as directed as needed. Reported on 12/05/2015     fluticasone (FLONASE) 50 MCG/ACT nasal spray PLACE 2 SPRAYS INTO THE NOSE DAILY. 48 g 3   folic acid (FOLVITE) 1 MG tablet Take 1 mg by mouth daily.     glucose blood (ACCU-CHEK GUIDE) test strip CHECK BLOOD SUGAR DAILY 100 strip 1   hydroxychloroquine (PLAQUENIL) 200 MG tablet Take 200 mg by mouth 2 (two) times daily.     loratadine (CLARITIN) 10 MG tablet Take 10 mg by mouth daily. MAY TAKE AT HS ALSO     methotrexate (RHEUMATREX) 2.5 MG tablet Take 15 mg by mouth once a week. Caution:Chemotherapy. Protect from light. ( Tuesday of each week )  pantoprazole (PROTONIX) 40 MG tablet TAKE 1 TABLET BY MOUTH EVERY DAY AT NOON 90 tablet 1   polyethylene glycol (MIRALAX / GLYCOLAX) packet Take 17 g by mouth daily.     predniSONE (DELTASONE) 5 MG tablet Take 5 mg by mouth daily.     No current facility-administered medications on file prior to visit.    Review of Systems  Constitutional:  Positive for fatigue. Negative for activity change, appetite change, fever and unexpected weight change.  HENT:  Negative for congestion, ear pain, rhinorrhea, sinus pressure and sore throat.   Eyes:  Negative for pain, redness and visual disturbance.  Respiratory:  Negative for cough, shortness of breath and wheezing.   Cardiovascular:  Negative for chest pain and palpitations.  Gastrointestinal:  Negative for abdominal pain, blood in stool,  constipation and diarrhea.  Endocrine: Negative for polydipsia, polyphagia and polyuria.  Genitourinary:  Negative for dysuria, frequency and urgency.  Musculoskeletal:  Positive for arthralgias and back pain. Negative for myalgias.  Skin:  Negative for pallor and rash.  Allergic/Immunologic: Negative for environmental allergies.  Neurological:  Negative for dizziness, syncope and headaches.  Hematological:  Negative for adenopathy. Does not bruise/bleed easily.  Psychiatric/Behavioral:  Negative for decreased concentration and dysphoric mood. The patient is not nervous/anxious.        Objective:   Physical Exam Constitutional:      General: She is not in acute distress.    Appearance: Normal appearance. She is well-developed. She is obese. She is not ill-appearing or diaphoretic.  HENT:     Head: Normocephalic and atraumatic.  Eyes:     General: No scleral icterus.    Conjunctiva/sclera: Conjunctivae normal.     Pupils: Pupils are equal, round, and reactive to light.  Neck:     Thyroid: No thyromegaly.     Vascular: No carotid bruit or JVD.  Cardiovascular:     Rate and Rhythm: Normal rate and regular rhythm.     Heart sounds: Normal heart sounds.     No gallop.  Pulmonary:     Effort: Pulmonary effort is normal. No respiratory distress.     Breath sounds: Normal breath sounds. No wheezing or rales.  Abdominal:     General: There is no distension or abdominal bruit.     Palpations: Abdomen is soft.  Musculoskeletal:     Cervical back: Normal range of motion and neck supple. No tenderness.     Right lower leg: No edema.     Left lower leg: No edema.     Comments: RA pain limits mobility   Needs help to rise from chair   Of note= regular size bp cuff hurts arm , used large   Lymphadenopathy:     Cervical: No cervical adenopathy.  Skin:    General: Skin is warm and dry.     Coloration: Skin is not pale.     Findings: No erythema or rash.     Comments: Tick bite area  on R thigh is much improved- almost completely gone Small area of erythema without induration noted   Neurological:     Mental Status: She is alert.     Cranial Nerves: No cranial nerve deficit.     Coordination: Coordination normal.     Deep Tendon Reflexes: Reflexes are normal and symmetric. Reflexes normal.  Psychiatric:        Mood and Affect: Mood normal.           Assessment & Plan:  Problem List Items Addressed This Visit       Cardiovascular and Mediastinum   Essential hypertension    Bp was up a bit more today  Need to use a large cuff on her  BP: 135/70   Is taking chlorthalidone 12.5 mg daily (may inc this next time if bp is not at goal) - aware can affect glucose Continue amlodipine 5 mg daily also   Enc her to avoid processed/salty foods  F/u 1 mo   She will work on getting a larger cuff for home also  Disc how pain affects bp and noted better to check at times when she feels less stress and pain  Given handout re: proper way to check bp Her husband helps her        Digestive   GERD    Much better with protonix        Endocrine   Controlled type 2 diabetes mellitus without complication, without long-term current use of insulin - Primary    Lab Results  Component Value Date   HGBA1C 7.2 (H) 02/06/2023  This is up  Continues prednisone 5 mg which she may not be able to get off of due to severe RA Also limited exercise  Disc low glycemic diet in detail (she has appt with a nurse to discuss on Friday)  She is allergic to metformin  Disc other options for tx (she decliines glp drug at this time)  Will start glipizide xl 2.5 mg daily  and inst to check glucose bid and f/u in 1 mo with her log  Disc risk of low glucose and need to follow closely  She will continue to discuss prednisone with rheumatology  Needs eye exam- she wants to schedule that with Dr Alvester Morin but ins is not taken will call for referral Due for microalb but could not get sample today  , will do next visit in 1 mo          Relevant Medications   glipiZIDE (GLUCOTROL XL) 2.5 MG 24 hr tablet   Hyperlipidemia associated with type 2 diabetes mellitus    Disc goals for lipids and reasons to control them Rev last labs with pt Rev low sat fat diet in detail LDL at goal (67) last check Trig up a bit poss due to inc blood glucose  Plan to continue atorvastatin 5 mg daily  Disc lean proteins to choose      Relevant Medications   glipiZIDE (GLUCOTROL XL) 2.5 MG 24 hr tablet     Musculoskeletal and Integument   RA (rheumatoid arthritis)    Severe symptoms  Mobility impaired Continues rheum f/u with  Plaquenil Methotrexate Low dose prednisone (which affects blood sugar)      Tick bite of right thigh    Much improved with doxycycline now  Reassuring         Other   Class 1 obesity due to excess calories with serious comorbidity and body mass index (BMI) of 31.0 to 31.9 in adult    Limited exercise due to RA Also chronic prednisone   Disc lower glycemic diet goals to work on        Relevant Medications   glipiZIDE (GLUCOTROL XL) 2.5 MG 24 hr tablet   On prednisone therapy    This affects weight and blood sugar Doubt she can come off due to severe RA She will disc further at rheum office

## 2023-02-11 NOTE — Assessment & Plan Note (Signed)
Limited exercise due to RA Also chronic prednisone   Disc lower glycemic diet goals to work on

## 2023-02-11 NOTE — Assessment & Plan Note (Signed)
Severe symptoms  Mobility impaired Continues rheum f/u with  Plaquenil Methotrexate Low dose prednisone (which affects blood sugar)

## 2023-02-11 NOTE — Assessment & Plan Note (Signed)
Much improved with doxycycline now  Reassuring

## 2023-02-13 ENCOUNTER — Ambulatory Visit: Payer: Medicare Other | Admitting: Family Medicine

## 2023-02-23 ENCOUNTER — Telehealth: Payer: Self-pay | Admitting: Family Medicine

## 2023-02-23 NOTE — Telephone Encounter (Signed)
Pt notified of Dr. Tower's comments and verbalized understanding  

## 2023-02-23 NOTE — Telephone Encounter (Signed)
Good questions Please follow up with me first so we can review her diet and discuss what she needs extra We will discuss nutritionist at that time as well (unsure if insurance would pay for that)   Thanks for the heads up

## 2023-02-23 NOTE — Telephone Encounter (Signed)
Patients daughter states that her mom almost fell on Saturday after going to her grandsons graduation. Pt daughter think that patient needs to be on some supplements to help support her after she has went through menopause and is now older in age. Patients daughter is asking if Dr. Milinda Antis would recommend Vitamin D3, magnesium gluconate, zinc, tumeric. She is requesting a referral to nurtrionist for a specific anti-inflammatory diet that would be beneficial for patient as well.

## 2023-02-23 NOTE — Telephone Encounter (Signed)
Pt daughter called in request a call back . Stated pt almost fell want to discuss different supplement pt can start . Please advise # [phone]

## 2023-03-02 ENCOUNTER — Encounter: Payer: Self-pay | Admitting: Family Medicine

## 2023-03-02 ENCOUNTER — Ambulatory Visit (INDEPENDENT_AMBULATORY_CARE_PROVIDER_SITE_OTHER): Payer: Medicare Other | Admitting: Family Medicine

## 2023-03-02 VITALS — BP 130/68 | HR 84 | Temp 97.6°F | Ht 67.0 in | Wt 198.0 lb

## 2023-03-02 DIAGNOSIS — R21 Rash and other nonspecific skin eruption: Secondary | ICD-10-CM

## 2023-03-02 DIAGNOSIS — E119 Type 2 diabetes mellitus without complications: Secondary | ICD-10-CM

## 2023-03-02 DIAGNOSIS — W57XXXA Bitten or stung by nonvenomous insect and other nonvenomous arthropods, initial encounter: Secondary | ICD-10-CM

## 2023-03-02 DIAGNOSIS — R531 Weakness: Secondary | ICD-10-CM | POA: Diagnosis not present

## 2023-03-02 DIAGNOSIS — S70361A Insect bite (nonvenomous), right thigh, initial encounter: Secondary | ICD-10-CM | POA: Diagnosis not present

## 2023-03-02 DIAGNOSIS — Z7409 Other reduced mobility: Secondary | ICD-10-CM | POA: Diagnosis not present

## 2023-03-02 MED ORDER — TRIAMCINOLONE ACETONIDE 0.1 % EX CREA: 1.0000 | TOPICAL_CREAM | Freq: Two times a day (BID) | CUTANEOUS | 0 refills | Status: DC

## 2023-03-02 NOTE — Assessment & Plan Note (Signed)
Offered ref to DM teaching  Pt declines due to lack of transportation  Reviewed low glycemic diet goals

## 2023-03-02 NOTE — Progress Notes (Signed)
Subjective:    Patient ID: [name], female    DOB: 1941/05/21, 82 y.o.   MRN: [id number]  HPI Pt presents with questions about diet and supplements  Also rash  Wt Readings from Last 3 Encounters:  03/02/23 198 lb (89.8 kg)  02/11/23 202 lb (91.6 kg)  02/06/23 203 lb (92.1 kg)   31.01 kg/m  Vitals:   03/02/23 1408  BP: 130/68  Pulse: 84  Temp: 97.6 F (36.4 C)  SpO2: 96%   Was treated for a tick bite on 4/19  Doxycycline for 7 d    Rash - R side and on her back , now a little on left arm  Itchy  Not burning or painful  None on face or trunk or hands or feet  It did start when she was on the abx   Tick bite is gone    She uses jergens lotion  Not new - years  No fever    Had call from pt's daughter on 5/6 Patients daughter states that her mom almost fell on Saturday after going to her grandsons graduation. Pt daughter think that patient needs to be on some supplements to help support her after she has went through menopause and is now older in age. Patients daughter is asking if Dr. Milinda Antis would recommend Vitamin D3, magnesium gluconate, zinc, tumeric. She is requesting a referral to nurtrionist for a specific anti-inflammatory diet that would be beneficial for patient as well.      Currently takes  Folic acid -from rheumatologist   Cannot do much exercise due to pain   Appetite is up and down   Lab Results  Component Value Date   HGBA1C 7.2 (H) 02/06/2023    Patient Active Problem List   Diagnosis Date Noted   Class 1 obesity due to excess calories with serious comorbidity and body mass index (BMI) of 31.0 to 31.9 in adult 02/11/2023   Tick bite of right thigh 02/06/2023   Breast pain, left 01/06/2023   Contusion of right great toe with damage to nail 01/06/2023   Heart murmur 08/27/2022   Mobility impaired 07/22/2022   Current use of proton pump inhibitor 10/01/2021   General weakness 10/01/2021   Poor balance 10/01/2021   On prednisone  therapy 10/01/2021   Controlled type 2 diabetes mellitus without complication, without long-term current use of insulin (HCC) 11/13/2020   Hyperlipidemia associated with type 2 diabetes mellitus (HCC) 10/15/2020   Pre-op exam 06/01/2020   Fatigue 01/30/2020   TMJ (dislocation of temporomandibular joint) 04/29/2019   Encounter for screening mammogram for breast cancer 10/27/2017   Routine general medical examination at a health care facility 10/27/2017   Aortic atherosclerosis (HCC) 02/03/2017   RA (rheumatoid arthritis) (HCC)    Rash 08/28/2016   Adjustment disorder with mixed anxiety and depressed mood 06/24/2016   Essential hypertension 01/07/2016   Coronary atherosclerosis 11/07/2015   Osteoarthritis 05/10/2014   GERD 07/09/2007   Past Medical History:  Diagnosis Date   Allergic rhinitis    Cataract 2015   bilateral   COVID-19 10/2019   ASYMPTOMATIC   Diabetes mellitus without complication (HCC)    Diverticulosis    Esophageal reflux    Essential hypertension 01/07/2016   Fatty liver 2018   Fibula fracture 02/2005   Former smoker    Gastritis 2003   Heart murmur    MILD, NO CARDIOLOGIST   Hemorrhoids    History of cardiac dysrhythmia 2011   ABLATION  AT [facility]   History of kidney stones YRS AGO   Hyperglycemia    Hyperlipemia    Osteoarthritis    Personal history of colonic polyps 1998   hyperplastic   PMB (postmenopausal bleeding)    Pre-diabetes    BORDERLINE DIET CONTROLLED DOES NOT CHECK CBG   RA (rheumatoid arthritis) (HCC)    Rectal bleed 2018   TRANSFUSION GIVEN   Shingles 2012   Uterine fibroid    Past Surgical History:  Procedure Laterality Date   CHOLECYSTECTOMY  YRS AGO   COLONOSCOPY  11/08   internal hemorrhoids   DILATATION & CURETTAGE/HYSTEROSCOPY WITH MYOSURE N/A 07/06/2020   Procedure: DILATATION & CURETTAGE/HYSTEROSCOPY;  Surgeon: Theresia Majors, MD;  Location: [facility] [address] [place];  Service: Gynecology;  Laterality: N/A;  request  7:30am OR time in [place] [facility] block requests 30 minutes   EP procedure  1994   SVT; normal echo   ESOPHAGOGASTRODUODENOSCOPY  11/03   reactive gastropathy   EYE SURGERY Bilateral 2017   CATARACTS   LIPOMA EXCISION  11/2002   SVT s/p surgery--? ablation  2011   AT [facility]   TUBAL LIGATION  YRS AGO   vaginal polyp excised  12/2000   benign   Social History   Tobacco Use   Smoking status: Former    Packs/day: 0.75    Years: 15.00    Additional pack years: 0.00    Total pack years: 11.25    Types: Cigarettes    Quit date: 10/21/2003    Years since quitting: 19.3   Smokeless tobacco: Never  Vaping Use   Vaping Use: Never used  Substance Use Topics   Alcohol use: No    Alcohol/week: 0.0 standard drinks of alcohol   Drug use: No   Family History  Problem Relation Age of Onset   Colon cancer Father 14   Diabetes Mother    Heart disease Mother    Kidney disease Mother        ESRD   Stroke Mother    Hypertension Brother    Breast cancer Sister 30   Diabetes Other        nephews   Colon cancer Sister    Esophageal cancer Neg Hx    Stomach cancer Neg Hx    Rectal cancer Neg Hx    Allergies  Allergen Reactions   Bee Venom Anaphylaxis   Dicyclomine     Blurry vision   Humira [Adalimumab]     LIPS SWELLED AND RASH   Metformin And Related     rash   Current Outpatient Medications on File Prior to Visit  Medication Sig Dispense Refill   Accu-Chek Softclix Lancets lancets Check glucose level daily and as needed for diabetes type 2  (lancets for the soft clicks lancing device AccuChek meter) 100 each 3   acetaminophen (TYLENOL) 325 MG tablet Take 650 mg by mouth every 6 (six) hours as needed.     albuterol (PROVENTIL HFA;VENTOLIN HFA) 108 (90 Base) MCG/ACT inhaler Inhale 2 puffs into the lungs every 4 (four) hours as needed for wheezing or shortness of breath. Please give spacer if possible 1 Inhaler 3   amLODipine (NORVASC) 5 MG tablet TAKE 1 TABLET BY MOUTH EVERY DAY  AT NOON (Patient taking differently: Take 5 mg by mouth daily.) 90 tablet 0   atorvastatin (LIPITOR) 10 MG tablet TAKE 1/2 TABLET BY MOUTH EVERY OTHER DAY 22 tablet 0   blood glucose meter kit and supplies To check glucose daily  and prn for DM2 E11.9 1 each 0   chlorthalidone (HYGROTON) 25 MG tablet Take 0.5 tablets (12.5 mg total) by mouth daily. 45 tablet 0   dicyclomine (BENTYL) 10 MG/5ML solution Take 1-2 mLs (2-4 mg total) by mouth 4 (four) times daily -  before meals and at bedtime. Prev with blurry vision on higher dose but not an allergy and may be able to tolerate lower dose. 100 mL 3   docusate sodium (COLACE) 100 MG capsule Take 1 capsule (100 mg total) by mouth 2 (two) times daily. 60 capsule 0   EPINEPHrine (EPIPEN IJ) Inject as directed as needed. Reported on 12/05/2015     fluticasone (FLONASE) 50 MCG/ACT nasal spray PLACE 2 SPRAYS INTO THE NOSE DAILY. 48 g 3   folic acid (FOLVITE) 1 MG tablet Take 1 mg by mouth daily.     glipiZIDE (GLUCOTROL XL) 2.5 MG 24 hr tablet Take 1 tablet (2.5 mg total) by mouth daily with breakfast. 30 tablet 1   glucose blood (ACCU-CHEK GUIDE) test strip CHECK BLOOD SUGAR DAILY 100 strip 1   hydroxychloroquine (PLAQUENIL) 200 MG tablet Take 200 mg by mouth 2 (two) times daily.     loratadine (CLARITIN) 10 MG tablet Take 10 mg by mouth daily. MAY TAKE AT HS ALSO     methotrexate (RHEUMATREX) 2.5 MG tablet Take 15 mg by mouth once a week. Caution:Chemotherapy. Protect from light. ( Tuesday of each week )     pantoprazole (PROTONIX) 40 MG tablet TAKE 1 TABLET BY MOUTH EVERY DAY AT NOON 90 tablet 1   polyethylene glycol (MIRALAX / GLYCOLAX) packet Take 17 g by mouth daily.     predniSONE (DELTASONE) 5 MG tablet Take 5 mg by mouth daily.     No current facility-administered medications on file prior to visit.     Review of Systems  Constitutional:  Positive for fatigue. Negative for activity change, appetite change, fever and unexpected weight change.   HENT:  Negative for congestion, ear pain, rhinorrhea, sinus pressure and sore throat.   Eyes:  Negative for pain, redness and visual disturbance.  Respiratory:  Negative for cough, shortness of breath and wheezing.   Cardiovascular:  Negative for chest pain and palpitations.  Gastrointestinal:  Negative for abdominal pain, blood in stool, constipation and diarrhea.  Endocrine: Negative for polydipsia and polyuria.  Genitourinary:  Negative for dysuria, frequency and urgency.  Musculoskeletal:  Positive for arthralgias. Negative for back pain and myalgias.  Skin:  Positive for rash. Negative for pallor.  Allergic/Immunologic: Negative for environmental allergies.  Neurological:  Positive for weakness. Negative for dizziness, syncope and headaches.  Hematological:  Negative for adenopathy. Does not bruise/bleed easily.  Psychiatric/Behavioral:  Negative for decreased concentration and dysphoric mood. The patient is not nervous/anxious.        Objective:   Physical Exam Constitutional:      General: She is not in acute distress.    Appearance: Normal appearance. She is well-developed. She is obese. She is not ill-appearing or diaphoretic.  HENT:     Head: Normocephalic and atraumatic.  Eyes:     Conjunctiva/sclera: Conjunctivae normal.     Pupils: Pupils are equal, round, and reactive to light.  Neck:     Thyroid: No thyromegaly.     Vascular: No carotid bruit or JVD.  Cardiovascular:     Rate and Rhythm: Normal rate and regular rhythm.     Heart sounds: Normal heart sounds.     No gallop.  Pulmonary:     Effort: Pulmonary effort is normal. No respiratory distress.     Breath sounds: Normal breath sounds. No wheezing or rales.  Abdominal:     General: There is no distension or abdominal bruit.     Palpations: Abdomen is soft. There is no mass.     Tenderness: There is no abdominal tenderness.  Musculoskeletal:     Cervical back: Normal range of motion and neck supple.      Right lower leg: No edema.     Left lower leg: No edema.     Comments: Very limited rom of knees and hips  Pt needs help rising from chair due to pain  Gait is labored   Lymphadenopathy:     Cervical: No cervical adenopathy.  Skin:    General: Skin is warm and dry.     Coloration: Skin is not pale.     Findings: No rash.     Comments: Mild papular rash with few healing excoriations on posterior neck and shoulders  No hyperpigmentation or erythema  No skin thickening   No vesicles   Neurological:     Mental Status: She is alert.     Coordination: Coordination normal.     Deep Tendon Reflexes: Reflexes are normal and symmetric. Reflexes normal.  Psychiatric:        Mood and Affect: Mood normal.           Assessment & Plan:   Problem List Items Addressed This Visit       Endocrine   Controlled type 2 diabetes mellitus without complication, without long-term current use of insulin (HCC)    Offered ref to DM teaching  Pt declines due to lack of transportation  Reviewed low glycemic diet goals         Musculoskeletal and Integument   Rash - Primary    Mild papular rash on posterior neck and shoulders  No hyperpigmentation  Few excoriations  Discussed products and recommend she avoid fragrances /hot water and harsh detergents Px triamcinolone cream to try bid  Update if not starting to improve in a week or if worsening    Meds ordered this encounter  Medications   triamcinolone cream (KENALOG) 0.1 %    Sig: Apply 1 Application topically 2 (two) times daily. On rash/affected area    Dispense:  30 g    Refill:  0        Tick bite of right thigh    Per pt resolved (from 3-4 wk ago)   Do not think current rash is related  (on neck/shoulders only) Also finished abx over 2 wk ago        Other   General weakness    Pt's family had questions re: nutrition and supplements  Recommend she continue folic acid from rheum Add 2000 iu D3 for bone health daily  Try  to eat balanced diet   Can check in with rheum to see if they recommend tumeric or other supplements       Mobility impaired    Family is concerned Mostly due to mod to severe arthritis pain in LEs Recommend she continue using walker   In hopes that pain would improve so she could build strength and stamina with exercise

## 2023-03-02 NOTE — Patient Instructions (Addendum)
Make note if any foods you eat make your pain worse   For bone health I recommend vitamin D3   2000 iu (units) every day   Continue your folic acid Try to eat a balanced diet -avoid added sugars and too many refined carbs  As a rule Try to get most of your carbohydrates from produce (with the exception of white potatoes)  Eat less bread/pasta/rice/snack foods/cereals/sweets and other items from the middle of the grocery store (processed carbs)   I can refer you to a nutritionist (diabetes education class) at any time if you want  Let us know if you have transportation and want to go   Ask your rheumatologist if they recommend other supplements or diet changes for arthritis   Magnesium can cause some diarrhea I don't have enough information on tumeric or zinc to recommend it at this time   For the rash - try triamcinolone cream  If it worsens let us know  Use scent free products (soap/lotion/ laundry detergent) My favorite soap is dove soap for sensitive skin  Avoid perfume Avoid fabric softener Be careful in the sun -your medication can make you more sensitive   Update if not starting to improve in a week or if worsening    You have some black heads on your back Keep clean with soap and water and if worse let me know

## 2023-03-02 NOTE — Assessment & Plan Note (Signed)
Family is concerned Mostly due to mod to severe arthritis pain in LEs Recommend she continue using walker   In hopes that pain would improve so she could build strength and stamina with exercise

## 2023-03-02 NOTE — Assessment & Plan Note (Signed)
Mild papular rash on posterior neck and shoulders  No hyperpigmentation  Few excoriations  Discussed products and recommend she avoid fragrances /hot water and harsh detergents Px triamcinolone cream to try bid  Update if not starting to improve in a week or if worsening    Meds ordered this encounter  Medications   triamcinolone cream (KENALOG) 0.1 %    Sig: Apply 1 Application topically 2 (two) times daily. On rash/affected area    Dispense:  30 g    Refill:  0

## 2023-03-02 NOTE — Assessment & Plan Note (Signed)
Per pt resolved (from 3-4 wk ago)   Do not think current rash is related  (on neck/shoulders only) Also finished abx over 2 wk ago

## 2023-03-02 NOTE — Assessment & Plan Note (Signed)
Pt's family had questions re: nutrition and supplements  Recommend she continue folic acid from rheum Add 2000 iu D3 for bone health daily  Try to eat balanced diet   Can check in with rheum to see if they recommend tumeric or other supplements

## 2023-03-05 ENCOUNTER — Other Ambulatory Visit: Payer: Self-pay | Admitting: Family Medicine

## 2023-03-13 ENCOUNTER — Encounter: Payer: Self-pay | Admitting: Family Medicine

## 2023-03-13 ENCOUNTER — Ambulatory Visit (INDEPENDENT_AMBULATORY_CARE_PROVIDER_SITE_OTHER): Payer: Medicare Other | Admitting: Family Medicine

## 2023-03-13 VITALS — BP 140/77 | HR 93 | Temp 97.8°F | Ht 67.0 in | Wt 192.2 lb

## 2023-03-13 DIAGNOSIS — E1169 Type 2 diabetes mellitus with other specified complication: Secondary | ICD-10-CM

## 2023-03-13 DIAGNOSIS — Z7984 Long term (current) use of oral hypoglycemic drugs: Secondary | ICD-10-CM | POA: Diagnosis not present

## 2023-03-13 DIAGNOSIS — E119 Type 2 diabetes mellitus without complications: Secondary | ICD-10-CM

## 2023-03-13 DIAGNOSIS — Z6831 Body mass index (BMI) 31.0-31.9, adult: Secondary | ICD-10-CM

## 2023-03-13 DIAGNOSIS — E785 Hyperlipidemia, unspecified: Secondary | ICD-10-CM | POA: Diagnosis not present

## 2023-03-13 DIAGNOSIS — E6609 Other obesity due to excess calories: Secondary | ICD-10-CM

## 2023-03-13 DIAGNOSIS — I10 Essential (primary) hypertension: Secondary | ICD-10-CM

## 2023-03-13 NOTE — Assessment & Plan Note (Signed)
Some wt loss /cutting portions and sugar commended

## 2023-03-13 NOTE — Assessment & Plan Note (Signed)
Disc goals for lipids and reasons to control them Rev last labs with pt Rev low sat fat diet in detail LDL at goal (67) last check Trig up a bit poss due to inc blood glucose  Plan to continue atorvastatin 5 mg daily

## 2023-03-13 NOTE — Assessment & Plan Note (Addendum)
Lab Results  Component Value Date   HGBA1C 7.2 (H) 02/06/2023   Since then, taking glipizide xl 2.5 mg daily and tolerating well  Improved glucose control No hypoglycemia  Eating a bit better and wt is down  Also smaller portions  Needs eye exam- her ins no longer covers Dr Alvester Morin , we will try to sign her up for our program here  Taking a statin and arb [facility] microalb  F/u for next visit with A1c in late July  This will likely improve if she can ever come off of prednisone from rheumatology

## 2023-03-13 NOTE — Assessment & Plan Note (Signed)
bp in fair control at this time  BP Readings from Last 1 Encounters:  03/13/23 (!) 140/77   No changes needed Most recent labs reviewed  Disc lifstyle change with low sodium diet and exercise  Suspect this is lower at home/plans to get a new cuff  Plan to continue Chlorthalidone 12.5 mg daily  Amlodipien 5 mg daily  F/u in July  If no improvement consider increase diuretic

## 2023-03-13 NOTE — Patient Instructions (Addendum)
Warm hands in some water before checking blood glucose This may help    Try to get most of your carbohydrates from produce (with the exception of white potatoes)  Eat less bread/pasta/rice/snack foods/cereals/sweets and other items from the middle of the grocery store (processed carbs)  Try to fit in more vegetables when you can   If you get a blood pressure cuff I like OMRON for the arm size large   Continue current medicines  If you get low glucose readings (below 70) let us know   I will give your name to the person arranging the diabetic eye exams here to see when the next date is

## 2023-03-13 NOTE — Progress Notes (Signed)
Subjective:    Patient ID: [name], female    DOB: 19-Jun-1941, 82 y.o.   MRN: [id number]  HPI Pt presents for f/u of DM2 and chronic health problems   Wt Readings from Last 3 Encounters:  03/13/23 192 lb 4 oz (87.2 kg)  03/02/23 198 lb (89.8 kg)  02/11/23 202 lb (91.6 kg)   30.11 kg/m  Vitals:   03/13/23 1355  BP: (!) 146/78  Pulse: 93  Temp: 97.8 F (36.6 C)  SpO2: 99%      DM2 Lab Results  Component Value Date   HGBA1C 7.2 (H) 02/06/2023   Last visit declined DM teaching referral due to lack of transportation  Takes prednisone for severe RA Allergic to metformin   Glipizide xl 2.5 mg daily   Pt forgot her log   Glucose ranges 100-117 first thing in am   In afternoon - after dinner  , about the same  Sometimes hard to get blood out of fingers   Diet is better  No sweets  Eating smaller portions   Some diabetic meal supplements if she misses a meal    No rice or pasta in a long time  Bread occ- usually with sandwich (on pc instead of 2)  Occ smoothie  Some salads Does not cook much - not as many green beans   Not much exercise due to arthritis   She has rheum f/u in June  Prednisone has not changed     Wt is down to 192  Her appetite changes day to day   Eye exam Sees Dr Alvester Morin  Lab Results  Component Value Date   MICROALBUR 3.0 (H) 10/07/2022   MICROALBUR 0.9 10/29/2018   HTN  Chlorthalidone 12.5 mg daily  Amlodipine 5 mg dialy   Hyperlipidemia Lab Results  Component Value Date   CHOL 143 02/06/2023   HDL 50 02/06/2023   LDLCALC 67 02/06/2023   LDLDIRECT 60.0 10/26/2018   TRIG 189 (H) 02/06/2023   CHOLHDL 2.9 02/06/2023   Atorvastatin 5 mg daily  LDL is at goal   Patient Active Problem List   Diagnosis Date Noted   Class 1 obesity due to excess calories with serious comorbidity and body mass index (BMI) of 31.0 to 31.9 in adult 02/11/2023   Tick bite of right thigh 02/06/2023   Breast pain, left 01/06/2023    Contusion of right great toe with damage to nail 01/06/2023   Heart murmur 08/27/2022   Mobility impaired 07/22/2022   Current use of proton pump inhibitor 10/01/2021   General weakness 10/01/2021   Poor balance 10/01/2021   On prednisone therapy 10/01/2021   Diabetes mellitus treated with oral medication (HCC) 11/13/2020   Hyperlipidemia associated with type 2 diabetes mellitus (HCC) 10/15/2020   Pre-op exam 06/01/2020   Fatigue 01/30/2020   TMJ (dislocation of temporomandibular joint) 04/29/2019   Encounter for screening mammogram for breast cancer 10/27/2017   Routine general medical examination at a health care facility 10/27/2017   Aortic atherosclerosis (HCC) 02/03/2017   RA (rheumatoid arthritis) (HCC)    Rash 08/28/2016   Adjustment disorder with mixed anxiety and depressed mood 06/24/2016   Essential hypertension 01/07/2016   Coronary atherosclerosis 11/07/2015   Osteoarthritis 05/10/2014   GERD 07/09/2007   Past Medical History:  Diagnosis Date   Allergic rhinitis    Cataract 2015   bilateral   COVID-19 10/2019   ASYMPTOMATIC   Diabetes mellitus without complication (HCC)    Diverticulosis  Esophageal reflux    Essential hypertension 01/07/2016   Fatty liver 2018   Fibula fracture 02/2005   Former smoker    Gastritis 2003   Heart murmur    MILD, NO CARDIOLOGIST   Hemorrhoids    History of cardiac dysrhythmia 2011   ABLATION  AT [facility]   History of kidney stones YRS AGO   Hyperglycemia    Hyperlipemia    Osteoarthritis    Personal history of colonic polyps 1998   hyperplastic   PMB (postmenopausal bleeding)    Pre-diabetes    BORDERLINE DIET CONTROLLED DOES NOT CHECK CBG   RA (rheumatoid arthritis) (HCC)    Rectal bleed 2018   TRANSFUSION GIVEN   Shingles 2012   Uterine fibroid    Past Surgical History:  Procedure Laterality Date   CHOLECYSTECTOMY  YRS AGO   COLONOSCOPY  11/08   internal hemorrhoids   DILATATION & CURETTAGE/HYSTEROSCOPY WITH  MYOSURE N/A 07/06/2020   Procedure: DILATATION & CURETTAGE/HYSTEROSCOPY;  Surgeon: Theresia Majors, MD;  Location: [facility];  Service: Gynecology;  Laterality: N/A;  request 7:30am OR time in [place] [facility]yn block requests 30 minutes   EP procedure  1994   SVT; normal echo   ESOPHAGOGASTRODUODENOSCOPY  11/03   reactive gastropathy   EYE SURGERY Bilateral 2017   CATARACTS   LIPOMA EXCISION  11/2002   SVT s/p surgery--? ablation  2011   AT [facility]   TUBAL LIGATION  YRS AGO   vaginal polyp excised  12/2000   benign   Social History   Tobacco Use   Smoking status: Former    Packs/day: 0.75    Years: 15.00    Additional pack years: 0.00    Total pack years: 11.25    Types: Cigarettes    Quit date: 10/21/2003    Years since quitting: 19.4   Smokeless tobacco: Never  Vaping Use   Vaping Use: Never used  Substance Use Topics   Alcohol use: No    Alcohol/week: 0.0 standard drinks of alcohol   Drug use: No   Family History  Problem Relation Age of Onset   Colon cancer Father 73   Diabetes Mother    Heart disease Mother    Kidney disease Mother        ESRD   Stroke Mother    Hypertension Brother    Breast cancer Sister 30   Diabetes Other        nephews   Colon cancer Sister    Esophageal cancer Neg Hx    Stomach cancer Neg Hx    Rectal cancer Neg Hx    Allergies  Allergen Reactions   Bee Venom Anaphylaxis   Dicyclomine     Blurry vision   Humira [Adalimumab]     LIPS SWELLED AND RASH   Metformin And Related     rash   Current Outpatient Medications on File Prior to Visit  Medication Sig Dispense Refill   Accu-Chek Softclix Lancets lancets Check glucose level daily and as needed for diabetes type 2  (lancets for the soft clicks lancing device AccuChek meter) 100 each 3   acetaminophen (TYLENOL) 325 MG tablet Take 650 mg by mouth every 6 (six) hours as needed.     albuterol (PROVENTIL HFA;VENTOLIN HFA) 108 (90 Base) MCG/ACT inhaler Inhale 2 puffs  into the lungs every 4 (four) hours as needed for wheezing or shortness of breath. Please give spacer if possible 1 Inhaler 3   amLODipine (NORVASC) 5 MG  tablet TAKE 1 TABLET BY MOUTH EVERY DAY AT NOON (Patient taking differently: Take 5 mg by mouth daily.) 90 tablet 0   atorvastatin (LIPITOR) 10 MG tablet TAKE 1/2 TABLET BY MOUTH EVERY OTHER DAY 22 tablet 0   blood glucose meter kit and supplies To check glucose daily and prn for DM2 E11.9 1 each 0   chlorthalidone (HYGROTON) 25 MG tablet Take 0.5 tablets (12.5 mg total) by mouth daily. 45 tablet 0   dicyclomine (BENTYL) 10 MG/5ML solution Take 1-2 mLs (2-4 mg total) by mouth 4 (four) times daily -  before meals and at bedtime. Prev with blurry vision on higher dose but not an allergy and may be able to tolerate lower dose. 100 mL 3   docusate sodium (COLACE) 100 MG capsule Take 1 capsule (100 mg total) by mouth 2 (two) times daily. 60 capsule 0   EPINEPHrine (EPIPEN IJ) Inject as directed as needed. Reported on 12/05/2015     fluticasone (FLONASE) 50 MCG/ACT nasal spray PLACE 2 SPRAYS INTO THE NOSE DAILY. 48 g 3   folic acid (FOLVITE) 1 MG tablet Take 1 mg by mouth daily.     glipiZIDE (GLUCOTROL XL) 2.5 MG 24 hr tablet TAKE 1 TABLET BY MOUTH DAILY WITH BREAKFAST. 90 tablet 0   glucose blood (ACCU-CHEK GUIDE) test strip CHECK BLOOD SUGAR DAILY 100 strip 1   hydroxychloroquine (PLAQUENIL) 200 MG tablet Take 200 mg by mouth 2 (two) times daily.     loratadine (CLARITIN) 10 MG tablet Take 10 mg by mouth daily. MAY TAKE AT HS ALSO     methotrexate (RHEUMATREX) 2.5 MG tablet Take 15 mg by mouth once a week. Caution:Chemotherapy. Protect from light. ( Tuesday of each week )     pantoprazole (PROTONIX) 40 MG tablet TAKE 1 TABLET BY MOUTH EVERY DAY AT NOON 90 tablet 1   polyethylene glycol (MIRALAX / GLYCOLAX) packet Take 17 g by mouth daily.     predniSONE (DELTASONE) 5 MG tablet Take 5 mg by mouth daily.     triamcinolone cream (KENALOG) 0.1 % Apply 1  Application topically 2 (two) times daily. On rash/affected area 30 g 0   No current facility-administered medications on file prior to visit.      Review of Systems  Constitutional:  Negative for activity change, appetite change, fatigue, fever and unexpected weight change.  HENT:  Negative for congestion, ear pain, rhinorrhea, sinus pressure and sore throat.   Eyes:  Negative for pain, redness and visual disturbance.  Respiratory:  Negative for cough, shortness of breath and wheezing.   Cardiovascular:  Negative for chest pain and palpitations.  Gastrointestinal:  Negative for abdominal pain, blood in stool, constipation and diarrhea.  Endocrine: Negative for polydipsia and polyuria.  Genitourinary:  Negative for dysuria, frequency and urgency.  Musculoskeletal:  Positive for arthralgias, back pain, gait problem and joint swelling. Negative for myalgias.  Skin:  Negative for pallor and rash.  Allergic/Immunologic: Negative for environmental allergies.  Neurological:  Negative for dizziness, syncope and headaches.  Hematological:  Negative for adenopathy. Does not bruise/bleed easily.  Psychiatric/Behavioral:  Negative for decreased concentration and dysphoric mood. The patient is not nervous/anxious.        Objective:   Physical Exam Constitutional:      General: She is not in acute distress.    Appearance: Normal appearance. She is well-developed. She is obese. She is not ill-appearing or diaphoretic.  HENT:     Head: Normocephalic and atraumatic.  Eyes:  Conjunctiva/sclera: Conjunctivae normal.     Pupils: Pupils are equal, round, and reactive to light.  Neck:     Thyroid: No thyromegaly.     Vascular: No carotid bruit or JVD.  Cardiovascular:     Rate and Rhythm: Normal rate and regular rhythm.     Heart sounds: Normal heart sounds.     No gallop.  Pulmonary:     Effort: Pulmonary effort is normal. No respiratory distress.     Breath sounds: Normal breath sounds. No  wheezing or rales.  Abdominal:     General: There is no distension or abdominal bruit.     Palpations: Abdomen is soft.  Musculoskeletal:     Cervical back: Normal range of motion and neck supple.     Right lower leg: No edema.     Left lower leg: No edema.     Comments: Gait is labored due to joint pain   Lymphadenopathy:     Cervical: No cervical adenopathy.  Skin:    General: Skin is warm and dry.     Coloration: Skin is not pale.     Findings: No rash.  Neurological:     Mental Status: She is alert.     Coordination: Coordination normal.     Deep Tendon Reflexes: Reflexes are normal and symmetric. Reflexes normal.  Psychiatric:        Mood and Affect: Mood normal.           Assessment & Plan:   Problem List Items Addressed This Visit       Cardiovascular and Mediastinum   Essential hypertension    bp in fair control at this time  BP Readings from Last 1 Encounters:  03/13/23 (!) 140/77  No changes needed Most recent labs reviewed  Disc lifstyle change with low sodium diet and exercise  Suspect this is lower at home/plans to get a new cuff  Plan to continue Chlorthalidone 12.5 mg daily  Amlodipien 5 mg daily  F/u in July  If no improvement consider increase diuretic         Endocrine   Diabetes mellitus treated with oral medication (HCC) - Primary    Lab Results  Component Value Date   HGBA1C 7.2 (H) 02/06/2023  Since then, taking glipizide xl 2.5 mg daily and tolerating well  Improved glucose control No hypoglycemia  Eating a bit better and wt is down  Also smaller portions  Needs eye exam- her ins no longer covers Dr Alvester Morin , we will try to sign her up for our program here  Taking a statin and arb [employer] microalb  F/u for next visit with A1c in late July  This will likely improve if she can ever come off of prednisone from rheumatology      Hyperlipidemia associated with type 2 diabetes mellitus (HCC)    Disc goals for lipids and reasons to  control them Rev last labs with pt Rev low sat fat diet in detail LDL at goal (67) last check Trig up a bit poss due to inc blood glucose  Plan to continue atorvastatin 5 mg daily

## 2023-03-18 ENCOUNTER — Other Ambulatory Visit: Payer: Self-pay | Admitting: Family Medicine

## 2023-03-26 ENCOUNTER — Telehealth: Payer: Self-pay | Admitting: Family Medicine

## 2023-03-26 NOTE — Telephone Encounter (Signed)
Patient contacted the office and stated she was having some issues with an upset stomach, states she has had diarrhea/ upset stomach. Wanted to know if Dr. Milinda Antis could send something in for her to take to help with this, advised patient providers don't usually prescribe without an examination. Informed her I would send the message for Dr. Milinda Antis to review, please advise, thank you.

## 2023-03-26 NOTE — Telephone Encounter (Signed)
If this is her same/ chronic abdominal pain that comes and goes- I noted at last eval that she would need GI f/u if not improving If it is something different then f/u in office here  Thanks

## 2023-03-27 NOTE — Telephone Encounter (Signed)
Pt notified of Dr. Tower's comments and recommendations and verbalized understanding  

## 2023-03-27 NOTE — Telephone Encounter (Signed)
Can try a little pepto bismol now and then (warn it will turn stool black) F/u of worse or not improving

## 2023-03-27 NOTE — Telephone Encounter (Signed)
Pt said that she thinks she ate something that "didn't agree with her stomach" and it gave her some diarrhea pt said it is a new sxs but it's getting better and she was just calling to ask PCP what she can take OTC to help with her diarrhea. Pt said she still does have some stomach issues but it's chronic and it's not really bad to the point she wants to see a GI doc. Pt doesn't think the diarrhea is bad enough to need an appt she just wanted a recommendation on what OTC med she can take.

## 2023-04-15 ENCOUNTER — Other Ambulatory Visit: Payer: Self-pay | Admitting: Family Medicine

## 2023-04-25 ENCOUNTER — Other Ambulatory Visit: Payer: Self-pay | Admitting: Family Medicine

## 2023-05-07 ENCOUNTER — Ambulatory Visit: Payer: Medicare Other | Admitting: Dietician

## 2023-05-07 LAB — HM DIABETES EYE EXAM

## 2023-05-12 ENCOUNTER — Ambulatory Visit: Payer: Medicare Other | Admitting: Family Medicine

## 2023-05-12 ENCOUNTER — Encounter: Payer: Self-pay | Admitting: Family Medicine

## 2023-05-12 VITALS — BP 154/80 | HR 95 | Temp 97.6°F | Ht 67.0 in | Wt 189.0 lb

## 2023-05-12 DIAGNOSIS — Z7984 Long term (current) use of oral hypoglycemic drugs: Secondary | ICD-10-CM | POA: Diagnosis not present

## 2023-05-12 DIAGNOSIS — M069 Rheumatoid arthritis, unspecified: Secondary | ICD-10-CM | POA: Diagnosis not present

## 2023-05-12 DIAGNOSIS — E119 Type 2 diabetes mellitus without complications: Secondary | ICD-10-CM

## 2023-05-12 DIAGNOSIS — I10 Essential (primary) hypertension: Secondary | ICD-10-CM | POA: Diagnosis not present

## 2023-05-12 MED ORDER — CHLORTHALIDONE 25 MG PO TABS: 25.0000 mg | ORAL_TABLET | Freq: Every day | ORAL | 1 refills | Status: DC

## 2023-05-12 NOTE — Assessment & Plan Note (Signed)
Worsening pain in all joints Wide spread /body ? If also some myofascial pain disorder  Also known OA  Sent for last Rheum note from Dr Simon Rhein plaquenil 200 mg bid and methotrexate 15 mg weekly along with prednisone (pt thinks 7 mg daily but unsure)  Encouraged close follow up  Worse pain left hip- ? If may need injectoin

## 2023-05-12 NOTE — Assessment & Plan Note (Addendum)
Blood pressure is up  BP: (!) 154/80  Will increase chlorthalidone to 25 mg daily / reviewed side effects Follow up and lab in 2 wk  Continue amlodipine 5 mg daily  Encouraged less processed foods in diet

## 2023-05-12 NOTE — Progress Notes (Signed)
Subjective:    Patient ID: [name], female    DOB: 01-16-1941, 82 y.o.   MRN: [id number]  HPI  Wt Readings from Last 3 Encounters:  05/12/23 189 lb (85.7 kg)  03/13/23 192 lb 4 oz (87.2 kg)  03/02/23 198 lb (89.8 kg)   29.60 kg/m  Vitals:   05/12/23 1452 05/12/23 1522  BP: (!) 154/90 (!) 154/80  Pulse: 95   Temp: 97.6 F (36.4 C)   SpO2: 95%    Pt presents for c/o pain all over her body    She has history of RA-mod to severe with mobility impairment  Also osteoarthtis   Sees rheumatology Dr Dierdre Forth   Plaquenil 200 mg bid  Methotrexate 15 mg weekly  Prednisone 5 mg daily in past (pt thinks on 7 now)  Folic acid   Next follow up in sept   Took humira in the past and did not tolerate it   In care everywhere  ESR of 25 on 12/31/22 Crp was normal as was cmp   Worse lately is the left hip joint    Prednisone causes DM2 Lab Results  Component Value Date   HGBA1C 7.2 (H) 02/06/2023  Less appetite due to pain and age she thinks  Has lost some weight   Eats sweets occational/now and then -not often  Family keeps them away from her      HTN bp is stable today  No cp or palpitations or headaches or edema  No side effects to medicines  BP Readings from Last 3 Encounters:  05/12/23 (!) 154/80  03/13/23 (!) 140/77  03/02/23 130/68    Chlorthalidone 12.5 mg daily  Amlodipine 5 mg daily   Here-last labs   Had a nosebleed on the right  It stopped after 15 minutes   Is checking blood pressure at home  Labile- up and down  Nothing above 150  None low    Cholesterol Lab Results  Component Value Date   CHOL 143 02/06/2023   HDL 50 02/06/2023   LDLCALC 67 02/06/2023   LDLDIRECT 60.0 10/26/2018   TRIG 189 (H) 02/06/2023   CHOLHDL 2.9 02/06/2023      Lab Results  Component Value Date   WBC 3.2 (L) 01/17/2022   HGB 12.9 01/17/2022   HCT 38.4 01/17/2022   MCV 84.9 01/17/2022   PLT 256.0 01/17/2022   Lab Results  Component Value  Date   NA 139 02/06/2023   K 3.6 02/06/2023   CO2 25 02/06/2023   GLUCOSE 111 (H) 02/06/2023   BUN 11 02/06/2023   CREATININE 0.77 02/06/2023   CALCIUM 9.3 02/06/2023   GFR 70.50 01/21/2023   GFRNONAA >60 01/26/2017   Lab Results  Component Value Date   ALT 6 01/17/2022   AST 17 01/17/2022   ALKPHOS 57 01/17/2022   BILITOT 0.4 01/17/2022    . Lab Results  Component Value Date   TSH 2.36 10/01/2021     Patient Active Problem List   Diagnosis Date Noted   Breast pain, left 01/06/2023   Contusion of right great toe with damage to nail 01/06/2023   Heart murmur 08/27/2022   Mobility impaired 07/22/2022   Current use of proton pump inhibitor 10/01/2021   General weakness 10/01/2021   Poor balance 10/01/2021   On prednisone therapy 10/01/2021   Diabetes mellitus treated with oral medication (HCC) 11/13/2020   Hyperlipidemia associated with type 2 diabetes mellitus (HCC) 10/15/2020   Pre-op exam 06/01/2020  Fatigue 01/30/2020   TMJ (dislocation of temporomandibular joint) 04/29/2019   Encounter for screening mammogram for breast cancer 10/27/2017   Routine general medical examination at a health care facility 10/27/2017   Aortic atherosclerosis (HCC) 02/03/2017   RA (rheumatoid arthritis) (HCC)    Rash 08/28/2016   Adjustment disorder with mixed anxiety and depressed mood 06/24/2016   Essential hypertension 01/07/2016   Coronary atherosclerosis 11/07/2015   Osteoarthritis 05/10/2014   GERD 07/09/2007   Past Medical History:  Diagnosis Date   Allergic rhinitis    Cataract 2015   bilateral   COVID-19 10/2019   ASYMPTOMATIC   Diabetes mellitus without complication (HCC)    Diverticulosis    Esophageal reflux    Essential hypertension 01/07/2016   Fatty liver 2018   Fibula fracture 02/2005   Former smoker    Gastritis 2003   Heart murmur    MILD, NO CARDIOLOGIST   Hemorrhoids    History of cardiac dysrhythmia 2011   ABLATION  AT [facility]   History of kidney  stones YRS AGO   Hyperglycemia    Hyperlipemia    Osteoarthritis    Personal history of colonic polyps 1998   hyperplastic   PMB (postmenopausal bleeding)    Pre-diabetes    BORDERLINE DIET CONTROLLED DOES NOT CHECK CBG   RA (rheumatoid arthritis) (HCC)    Rectal bleed 2018   TRANSFUSION GIVEN   Shingles 2012   Uterine fibroid    Past Surgical History:  Procedure Laterality Date   CHOLECYSTECTOMY  YRS AGO   COLONOSCOPY  11/08   internal hemorrhoids   DILATATION & CURETTAGE/HYSTEROSCOPY WITH MYOSURE N/A 07/06/2020   Procedure: DILATATION & CURETTAGE/HYSTEROSCOPY;  Surgeon: Theresia Majors, MD;  Location: [facility] [place];  Service: Gynecology;  Laterality: N/A;  request 7:30am OR time in [place] [facility] block requests 30 minutes   EP procedure  1994   SVT; normal echo   ESOPHAGOGASTRODUODENOSCOPY  11/03   reactive gastropathy   EYE SURGERY Bilateral 2017   CATARACTS   LIPOMA EXCISION  11/2002   SVT s/p surgery--? ablation  2011   AT [facility]   TUBAL LIGATION  YRS AGO   vaginal polyp excised  12/2000   benign   Social History   Tobacco Use   Smoking status: Former    Current packs/day: 0.00    Average packs/day: 0.8 packs/day for 15.0 years (11.3 ttl pk-yrs)    Types: Cigarettes    Start date: 10/20/1988    Quit date: 10/21/2003    Years since quitting: 19.5   Smokeless tobacco: Never  Vaping Use   Vaping status: Never Used  Substance Use Topics   Alcohol use: No    Alcohol/week: 0.0 standard drinks of alcohol   Drug use: No   Family History  Problem Relation Age of Onset   Colon cancer Father 80   Diabetes Mother    Heart disease Mother    Kidney disease Mother        ESRD   Stroke Mother    Hypertension Brother    Breast cancer Sister 30   Diabetes Other        nephews   Colon cancer Sister    Esophageal cancer Neg Hx    Stomach cancer Neg Hx    Rectal cancer Neg Hx    Allergies  Allergen Reactions   Bee Venom Anaphylaxis   Dicyclomine      Blurry vision   Humira [Adalimumab]     LIPS SWELLED  AND RASH   Metformin And Related     rash   Current Outpatient Medications on File Prior to Visit  Medication Sig Dispense Refill   Accu-Chek Softclix Lancets lancets Check glucose level daily and as needed for diabetes type 2  (lancets for the soft clicks lancing device AccuChek meter) 100 each 3   acetaminophen (TYLENOL) 325 MG tablet Take 650 mg by mouth every 6 (six) hours as needed.     albuterol (PROVENTIL HFA;VENTOLIN HFA) 108 (90 Base) MCG/ACT inhaler Inhale 2 puffs into the lungs every 4 (four) hours as needed for wheezing or shortness of breath. Please give spacer if possible 1 Inhaler 3   amLODipine (NORVASC) 5 MG tablet TAKE 1 TABLET BY MOUTH EVERY DAY AT NOON (Patient taking differently: Take 5 mg by mouth daily.) 90 tablet 0   atorvastatin (LIPITOR) 10 MG tablet TAKE 1/2 TABLET BY MOUTH EVERY OTHER DAY 22 tablet 2   blood glucose meter kit and supplies To check glucose daily and prn for DM2 E11.9 1 each 0   dicyclomine (BENTYL) 10 MG/5ML solution Take 1-2 mLs (2-4 mg total) by mouth 4 (four) times daily -  before meals and at bedtime. Prev with blurry vision on higher dose but not an allergy and may be able to tolerate lower dose. 100 mL 3   docusate sodium (COLACE) 100 MG capsule Take 1 capsule (100 mg total) by mouth 2 (two) times daily. 60 capsule 0   EPINEPHrine (EPIPEN IJ) Inject as directed as needed. Reported on 12/05/2015     fluticasone (FLONASE) 50 MCG/ACT nasal spray PLACE 2 SPRAYS INTO THE NOSE DAILY. 48 g 3   folic acid (FOLVITE) 1 MG tablet Take 1 mg by mouth daily.     glipiZIDE (GLUCOTROL XL) 2.5 MG 24 hr tablet TAKE 1 TABLET BY MOUTH DAILY WITH BREAKFAST. 90 tablet 0   glucose blood (ACCU-CHEK GUIDE) test strip CHECK BLOOD SUGAR DAILY 100 strip 1   hydroxychloroquine (PLAQUENIL) 200 MG tablet Take 200 mg by mouth 2 (two) times daily.     loratadine (CLARITIN) 10 MG tablet Take 10 mg by mouth daily. MAY TAKE AT HS  ALSO     methotrexate (RHEUMATREX) 2.5 MG tablet Take 15 mg by mouth once a week. Caution:Chemotherapy. Protect from light. ( Tuesday of each week )     pantoprazole (PROTONIX) 40 MG tablet TAKE 1 TABLET BY MOUTH EVERY DAY AT NOON 90 tablet 1   polyethylene glycol (MIRALAX / GLYCOLAX) packet Take 17 g by mouth daily.     predniSONE (DELTASONE) 5 MG tablet Take 5 mg by mouth daily.     triamcinolone cream (KENALOG) 0.1 % Apply 1 Application topically 2 (two) times daily. On rash/affected area 30 g 0   No current facility-administered medications on file prior to visit.    Review of Systems  Constitutional:  Negative for activity change, appetite change, fatigue, fever and unexpected weight change.  HENT:  Negative for congestion, ear pain, rhinorrhea, sinus pressure and sore throat.   Eyes:  Negative for pain, redness and visual disturbance.  Respiratory:  Negative for cough, shortness of breath and wheezing.   Cardiovascular:  Negative for chest pain and palpitations.  Gastrointestinal:  Negative for abdominal pain, blood in stool, constipation and diarrhea.  Endocrine: Negative for polydipsia and polyuria.  Genitourinary:  Negative for dysuria, frequency and urgency.  Musculoskeletal:  Positive for arthralgias, back pain and joint swelling.  Skin:  Negative for pallor and rash.  Allergic/Immunologic: Negative for environmental allergies.  Neurological:  Negative for dizziness, syncope and headaches.  Hematological:  Negative for adenopathy. Does not bruise/bleed easily.  Psychiatric/Behavioral:  Negative for decreased concentration and dysphoric mood. The patient is not nervous/anxious.        Objective:   Physical Exam Constitutional:      General: She is not in acute distress.    Appearance: Normal appearance. She is normal weight. She is not ill-appearing or diaphoretic.  Eyes:     Conjunctiva/sclera: Conjunctivae normal.     Pupils: Pupils are equal, round, and reactive to  light.  Cardiovascular:     Rate and Rhythm: Normal rate and regular rhythm.     Heart sounds: Murmur heard.  Pulmonary:     Effort: Pulmonary effort is normal. No respiratory distress.     Breath sounds: Normal breath sounds. No stridor. No wheezing, rhonchi or rales.  Musculoskeletal:        General: Tenderness present.     Cervical back: Neck supple.     Right lower leg: No edema.     Left lower leg: No edema.     Comments: Tender in mcp joints Some myofascial trigger areas   Limited rom of hips Walks slowly with assist   Lymphadenopathy:     Cervical: No cervical adenopathy.  Skin:    Findings: No erythema or rash.  Neurological:     Mental Status: She is alert.     Cranial Nerves: No cranial nerve deficit.     Deep Tendon Reflexes: Reflexes normal.  Psychiatric:        Mood and Affect: Mood normal.           Assessment & Plan:   Problem List Items Addressed This Visit       Cardiovascular and Mediastinum   Essential hypertension    Blood pressure is up  BP: (!) 154/80  Will increase chlorthalidone to 25 mg daily / reviewed side effects Follow up and lab in 2 wk  Continue amlodipine 5 mg daily  Encouraged less processed foods in diet       Relevant Medications   chlorthalidone (HYGROTON) 25 MG tablet   Other Relevant Orders   Comprehensive metabolic panel   CBC with Differential/Platelet     Endocrine   Diabetes mellitus treated with oral medication (HCC)    Lab Results  Component Value Date   HGBA1C 7.2 (H) 02/06/2023   Since then, taking glipizide xl 2.5 mg daily and tolerating well  Improved glucose control initially No hypoglycemia  Eating a bit better and wt is down  Also smaller portions  Needs eye exam- her ins no longer covers Dr Alvester Morin , we will try to sign her up for our program here  Taking a statin and arb [employer]td microalb  A2c ordered today  This will likely improve if she can ever come off of prednisone from rheumatology       Relevant Orders   Hemoglobin A1c     Musculoskeletal and Integument   RA (rheumatoid arthritis) (HCC) - Primary    Worsening pain in all joints Wide spread /body ? If also some myofascial pain disorder  Also known OA  Sent for last Rheum note from Dr Simon Rhein plaquenil 200 mg bid and methotrexate 15 mg weekly along with prednisone (pt thinks 7 mg daily but unsure)  Encouraged close follow up  Worse pain left hip- ? If may need injectoin

## 2023-05-12 NOTE — Patient Instructions (Addendum)
Give the rheumatology office a call - if symtoms are suddenly worse let them know  Maybe you can be seen before your scheduled in September   Today blood pressure is up  I want to increase your chlorthalidone from 12.5 mg to 25 mg once daily  The pills you have left - take 2 each morning Then the new prescription is for 25 mg so that will be one pill daily   No change in other medicines  Keep watching your diet   Labs today for diabetes and blood pressure   Follow up here in about 2 weeks for blood pressure visit and more labs   Take care of yourself the best you can

## 2023-05-12 NOTE — Assessment & Plan Note (Signed)
Lab Results  Component Value Date   HGBA1C 7.2 (H) 02/06/2023   Since then, taking glipizide xl 2.5 mg daily and tolerating well  Improved glucose control initially No hypoglycemia  Eating a bit better and wt is down  Also smaller portions  Needs eye exam- her ins no longer covers Dr Alvester Morin , we will try to sign her up for our program here  Taking a statin and arb Utd microalb  A2c ordered today  This will likely improve if she can ever come off of prednisone from rheumatology

## 2023-05-13 LAB — CBC WITH DIFFERENTIAL/PLATELET
Basophils Absolute: 0.1 10*3/uL (ref 0.0–0.1)
Basophils Relative: 1.3 % (ref 0.0–3.0)
Eosinophils Absolute: 0.4 10*3/uL (ref 0.0–0.7)
Eosinophils Relative: 7.1 % — ABNORMAL HIGH (ref 0.0–5.0)
HCT: 40.2 % (ref 36.0–46.0)
Hemoglobin: 12.9 g/dL (ref 12.0–15.0)
Lymphocytes Relative: 29.7 % (ref 12.0–46.0)
Lymphs Abs: 1.7 10*3/uL (ref 0.7–4.0)
MCHC: 32.2 g/dL (ref 30.0–36.0)
MCV: 86.3 fl (ref 78.0–100.0)
Monocytes Absolute: 0.2 10*3/uL (ref 0.1–1.0)
Monocytes Relative: 3.4 % (ref 3.0–12.0)
Neutro Abs: 3.4 10*3/uL (ref 1.4–7.7)
Neutrophils Relative %: 58.5 % (ref 43.0–77.0)
Platelets: 357 10*3/uL (ref 150.0–400.0)
RBC: 4.66 Mil/uL (ref 3.87–5.11)
RDW: 15.8 % — ABNORMAL HIGH (ref 11.5–15.5)
WBC: 5.8 10*3/uL (ref 4.0–10.5)

## 2023-05-13 LAB — COMPREHENSIVE METABOLIC PANEL
ALT: 9 U/L (ref 0–35)
AST: 28 U/L (ref 0–37)
Albumin: 3.8 g/dL (ref 3.5–5.2)
Alkaline Phosphatase: 57 U/L (ref 39–117)
BUN: 6 mg/dL (ref 6–23)
CO2: 27 mEq/L (ref 19–32)
Calcium: 9.1 mg/dL (ref 8.4–10.5)
Chloride: 102 mEq/L (ref 96–112)
Creatinine, Ser: 0.72 mg/dL (ref 0.40–1.20)
GFR: 78.63 mL/min (ref >60.00)
Glucose, Bld: 104 mg/dL — ABNORMAL HIGH (ref 70–99)
Potassium: 3.8 mEq/L (ref 3.5–5.1)
Sodium: 139 mEq/L (ref 135–145)
Total Bilirubin: 0.5 mg/dL (ref 0.2–1.2)
Total Protein: 7.7 g/dL (ref 6.0–8.3)

## 2023-05-13 LAB — HEMOGLOBIN A1C: Hgb A1c MFr Bld: 6.8 % — ABNORMAL HIGH (ref 4.6–6.5)

## 2023-05-14 ENCOUNTER — Encounter: Payer: Self-pay | Admitting: *Deleted

## 2023-05-15 ENCOUNTER — Encounter: Payer: Self-pay | Admitting: Primary Care

## 2023-05-27 ENCOUNTER — Encounter: Payer: Self-pay | Admitting: Family Medicine

## 2023-05-27 ENCOUNTER — Ambulatory Visit (INDEPENDENT_AMBULATORY_CARE_PROVIDER_SITE_OTHER): Payer: Medicare Other | Admitting: Family Medicine

## 2023-05-27 VITALS — BP 130/65 | HR 95 | Temp 97.9°F | Ht 67.0 in | Wt 184.0 lb

## 2023-05-27 DIAGNOSIS — R110 Nausea: Secondary | ICD-10-CM | POA: Insufficient documentation

## 2023-05-27 DIAGNOSIS — Z7984 Long term (current) use of oral hypoglycemic drugs: Secondary | ICD-10-CM

## 2023-05-27 DIAGNOSIS — E119 Type 2 diabetes mellitus without complications: Secondary | ICD-10-CM

## 2023-05-27 DIAGNOSIS — I10 Essential (primary) hypertension: Secondary | ICD-10-CM | POA: Diagnosis not present

## 2023-05-27 DIAGNOSIS — R8290 Unspecified abnormal findings in urine: Secondary | ICD-10-CM

## 2023-05-27 DIAGNOSIS — M069 Rheumatoid arthritis, unspecified: Secondary | ICD-10-CM

## 2023-05-27 LAB — POC URINALSYSI DIPSTICK (AUTOMATED)
Bilirubin, UA: 2
Blood, UA: NEGATIVE
Glucose, UA: NEGATIVE
Ketones, UA: 5
Nitrite, UA: POSITIVE — AB
Protein, UA: POSITIVE — AB
Spec Grav, UA: >=1.03 — AB (ref 1.010–1.025)
Urobilinogen, UA: 2 E.U./dL — AB
pH, UA: 6 (ref 5.0–8.0)

## 2023-05-27 MED ORDER — ONDANSETRON HCL 4 MG PO TABS: 4.0000 mg | ORAL_TABLET | Freq: Three times a day (TID) | ORAL | 0 refills | Status: DC | PRN

## 2023-05-27 NOTE — Assessment & Plan Note (Signed)
Blood pressure is improved BP: 130/65   Urged to continue the chlorthalidone at 25 mg daily (this was increased last time) Amlodipine 5 mg daily  Tolerating medications   Check at home when able  Labs today

## 2023-05-27 NOTE — Assessment & Plan Note (Addendum)
New, today  No vomiting or diarrhea  No fever or respiratory symptoms  Some abdominal griping (from not eating)  No history of gastroparesis  Takes ppi for GERD Has had ccy in past   Prescription zofran sent to pharmacy  Encouraged sips of fluids to prevent dehydration  Instructed to hold glipizide if not eating Lab today for cmet and cbc  Urinalysis pending  Will continue to follow  Discussed possible viral cause

## 2023-05-27 NOTE — Assessment & Plan Note (Signed)
In pt with chronic prednisone for RA   Last A1c improved Lab Results  Component Value Date   HGBA1C 6.8 (H) 05/12/2023   Eating less/ less appetite with age also  Encouraged low glycemic diet with plenty of protein  Microalb utd  Eye exam utd  Taking atorvastatin

## 2023-05-27 NOTE — Progress Notes (Addendum)
Subjective:    Patient ID: [name], female    DOB: 1940-11-10, 82 y.o.   MRN: [id number]  HPI  Wt Readings from Last 3 Encounters:  05/27/23 184 lb (83.5 kg)  05/12/23 189 lb (85.7 kg)  03/13/23 192 lb 4 oz (87.2 kg)   28.82 kg/m  Vitals:   05/27/23 1509 05/27/23 1538  BP: (!) 146/72 130/65  Pulse: 95   Temp: 97.9 F (36.6 C)   SpO2: 95%     Pt presents for follow up of HTN and DM2 and chronic health problems  Incidentally feels nauseated today  Almost did not come to the office  Has not vomited  No diarrhea  No cold symptoms   Takes protonix daily     bp is stable today  No cp or palpitations or headaches or edema  No side effects to medicines  BP Readings from Last 3 Encounters:  05/27/23 130/65  05/12/23 (!) 154/80  03/13/23 (!) 140/77    Last visit we increased her chlorthalidone dose to 25 mg daily  Continues amlodipine 5 mg daily   Blood pressure was improving at home    Encouraged less processed foods in diet  Does not eat salty foods  Less appetite for 2 months or so anyway    Urinalysis today  Results for orders placed or performed in visit on 05/27/23  Urine Culture   Specimen: Urine  Result Value Ref Range   MICRO NUMBER: [id number]    SPECIMEN QUALITY: Adequate    Sample Source URINE    STATUS: FINAL    ISOLATE 1: Klebsiella pneumoniae (A)       Susceptibility   Klebsiella pneumoniae - URINE CULTURE, REFLEX    AMOX/CLAVULANIC <=2 Sensitive     AMPICILLIN >=32 Resistant     AMPICILLIN/SULBACTAM 4 Sensitive     CEFAZOLIN* <=4 Not Reportable      * For infections other than uncomplicated UTI caused by E. coli, K. pneumoniae or P. mirabilis: Cefazolin is resistant if MIC > or = 8 mcg/mL. (Distinguishing susceptible versus intermediate for isolates with MIC < or = 4 mcg/mL requires additional testing.) For uncomplicated UTI caused by E. coli, K. pneumoniae or P. mirabilis: Cefazolin is susceptible if MIC <32 mcg/mL and  predicts susceptible to the oral agents cefaclor, cefdinir, cefpodoxime, cefprozil, cefuroxime, cephalexin and loracarbef.     CEFTAZIDIME <=1 Sensitive     CEFEPIME <=1 Sensitive     CEFTRIAXONE <=1 Sensitive     CIPROFLOXACIN <=0.25 Sensitive     LEVOFLOXACIN <=0.12 Sensitive     GENTAMICIN <=1 Sensitive     IMIPENEM <=0.25 Sensitive     NITROFURANTOIN 64 Intermediate     PIP/TAZO <=4 Sensitive     TOBRAMYCIN <=1 Sensitive     TRIMETH/SULFA* <=20 Sensitive      * For infections other than uncomplicated UTI caused by E. coli, K. pneumoniae or P. mirabilis: Cefazolin is resistant if MIC > or = 8 mcg/mL. (Distinguishing susceptible versus intermediate for isolates with MIC < or = 4 mcg/mL requires additional testing.) For uncomplicated UTI caused by E. coli, K. pneumoniae or P. mirabilis: Cefazolin is susceptible if MIC <32 mcg/mL and predicts susceptible to the oral agents cefaclor, cefdinir, cefpodoxime, cefprozil, cefuroxime, cephalexin and loracarbef. Legend: S = Susceptible  I = Intermediate R = Resistant  NS = Not susceptible * = Not tested  NR = Not reported **NN = See antimicrobic comments   Comprehensive metabolic panel  Result Value  Ref Range   Sodium 138 135 - 145 mEq/L   Potassium 4.0 3.5 - 5.1 mEq/L   Chloride 101 96 - 112 mEq/L   CO2 26 19 - 32 mEq/L   Glucose, Bld 122 (H) 70 - 99 mg/dL   BUN 9 6 - 23 mg/dL   Creatinine, Ser 1.61 0.40 - 1.20 mg/dL   Total Bilirubin 0.5 0.2 - 1.2 mg/dL   Alkaline Phosphatase 58 39 - 117 U/L   AST 28 0 - 37 U/L   ALT 8 0 - 35 U/L   Total Protein 8.3 6.0 - 8.3 g/dL   Albumin 4.0 3.5 - 5.2 g/dL   GFR 09.60 >45.40 mL/min   Calcium 9.4 8.4 - 10.5 mg/dL  CBC with Differential/Platelet  Result Value Ref Range   WBC 6.5 4.0 - 10.5 K/uL   RBC 4.98 3.87 - 5.11 Mil/uL   Hemoglobin 13.9 12.0 - 15.0 g/dL   HCT 98.1 19.1 - 47.8 %   MCV 86.8 78.0 - 100.0 fl   MCHC 32.2 30.0 - 36.0 g/dL   RDW 29.5 (H) 62.1 - 30.8 %   Platelets  429.0 (H) 150.0 - 400.0 K/uL   Neutrophils Relative % 70.1 43.0 - 77.0 %   Lymphocytes Relative 25.7 12.0 - 46.0 %   Monocytes Relative 2.0 (L) 3.0 - 12.0 %   Eosinophils Relative 1.8 0.0 - 5.0 %   Basophils Relative 0.4 0.0 - 3.0 %   Neutro Abs 4.6 1.4 - 7.7 K/uL   Lymphs Abs 1.7 0.7 - 4.0 K/uL   Monocytes Absolute 0.1 0.1 - 1.0 K/uL   Eosinophils Absolute 0.1 0.0 - 0.7 K/uL   Basophils Absolute 0.0 0.0 - 0.1 K/uL  POCT Urinalysis Dipstick (Automated)  Result Value Ref Range   Color, UA Dark Amber    Clarity, UA Cloudy    Glucose, UA Negative Negative   Bilirubin, UA 2 mg/dL    Ketones, UA 5 mg/dL    Spec Grav, UA >=6.578 (A) 1.010 - 1.025   Blood, UA Negative    pH, UA 6.0 5.0 - 8.0   Protein, UA Positive (A) Negative   Urobilinogen, UA 2.0 (A) 0.2 or 1.0 E.U./dL   Nitrite, UA Positive (A)    Leukocytes, UA Small (1+) (A) Negative      Lab Results  Component Value Date   NA 138 05/27/2023   K 4.0 05/27/2023   CO2 26 05/27/2023   GLUCOSE 122 (H) 05/27/2023   BUN 9 05/27/2023   CREATININE 0.80 05/27/2023   CALCIUM 9.4 05/27/2023   GFR 69.27 05/27/2023   GFRNONAA >60 01/26/2017     DM2 Lab Results  Component Value Date   HGBA1C 6.8 (H) 05/12/2023   This was improved  Glipizide xl 2.5 mg daily  Eating better  Still taking prednisone from rheumatology  Planning next eye exam here with [facility]  Lab Results  Component Value Date   MICROALBUR 3.0 (H) 10/07/2022   MICROALBUR 0.9 10/29/2018   Lab Results  Component Value Date   CHOL 143 02/06/2023   HDL 50 02/06/2023   LDLCALC 67 02/06/2023   LDLDIRECT 60.0 10/26/2018   TRIG 189 (H) 02/06/2023   CHOLHDL 2.9 02/06/2023    Has almost fallen twice at home due to RA Has rheum appt early in sept    Patient Active Problem List   Diagnosis Date Noted   Nausea 05/27/2023   Breast pain, left 01/06/2023   Contusion of right great toe with  damage to nail 01/06/2023   Heart murmur 08/27/2022   Mobility impaired  07/22/2022   Current use of proton pump inhibitor 10/01/2021   General weakness 10/01/2021   Poor balance 10/01/2021   On prednisone therapy 10/01/2021   Diabetes mellitus treated with oral medication (HCC) 11/13/2020   Hyperlipidemia associated with type 2 diabetes mellitus (HCC) 10/15/2020   Pre-op exam 06/01/2020   Abnormal urinalysis 04/03/2020   Fatigue 01/30/2020   TMJ (dislocation of temporomandibular joint) 04/29/2019   Encounter for screening mammogram for breast cancer 10/27/2017   Routine general medical examination at a health care facility 10/27/2017   Aortic atherosclerosis (HCC) 02/03/2017   RA (rheumatoid arthritis) (HCC)    Rash 08/28/2016   Adjustment disorder with mixed anxiety and depressed mood 06/24/2016   Essential hypertension 01/07/2016   Coronary atherosclerosis 11/07/2015   Osteoarthritis 05/10/2014   GERD 07/09/2007   Past Medical History:  Diagnosis Date   Allergic rhinitis    Cataract 2015   bilateral   COVID-19 10/2019   ASYMPTOMATIC   Diabetes mellitus without complication (HCC)    Diverticulosis    Esophageal reflux    Essential hypertension 01/07/2016   Fatty liver 2018   Fibula fracture 02/2005   Former smoker    Gastritis 2003   Heart murmur    MILD, NO CARDIOLOGIST   Hemorrhoids    History of cardiac dysrhythmia 2011   ABLATION  AT [facility]   History of kidney stones YRS AGO   Hyperglycemia    Hyperlipemia    Osteoarthritis    Personal history of colonic polyps 1998   hyperplastic   PMB (postmenopausal bleeding)    Pre-diabetes    BORDERLINE DIET CONTROLLED DOES NOT CHECK CBG   RA (rheumatoid arthritis) (HCC)    Rectal bleed 2018   TRANSFUSION GIVEN   Shingles 2012   Uterine fibroid    Past Surgical History:  Procedure Laterality Date   CHOLECYSTECTOMY  YRS AGO   COLONOSCOPY  11/08   internal hemorrhoids   DILATATION & CURETTAGE/HYSTEROSCOPY WITH MYOSURE N/A 07/06/2020   Procedure: DILATATION & CURETTAGE/HYSTEROSCOPY;   Surgeon: Theresia Majors, MD;  Location: [facility] [place];  Service: Gynecology;  Laterality: N/A;  request 7:30am OR time in [place] [facility] block requests 30 minutes   EP procedure  1994   SVT; normal echo   ESOPHAGOGASTRODUODENOSCOPY  11/03   reactive gastropathy   EYE SURGERY Bilateral 2017   CATARACTS   LIPOMA EXCISION  11/2002   SVT s/p surgery--? ablation  2011   AT [facility]   TUBAL LIGATION  YRS AGO   vaginal polyp excised  12/2000   benign   Social History   Tobacco Use   Smoking status: Former    Current packs/day: 0.00    Average packs/day: 0.8 packs/day for 15.0 years (11.3 ttl pk-yrs)    Types: Cigarettes    Start date: 10/20/1988    Quit date: 10/21/2003    Years since quitting: 19.6   Smokeless tobacco: Never  Vaping Use   Vaping status: Never Used  Substance Use Topics   Alcohol use: No    Alcohol/week: 0.0 standard drinks of alcohol   Drug use: No   Family History  Problem Relation Age of Onset   Colon cancer Father 27   Diabetes Mother    Heart disease Mother    Kidney disease Mother        ESRD   Stroke Mother    Hypertension Brother    Breast  cancer Sister 31   Diabetes Other        nephews   Colon cancer Sister    Esophageal cancer Neg Hx    Stomach cancer Neg Hx    Rectal cancer Neg Hx    Allergies  Allergen Reactions   Bee Venom Anaphylaxis   Dicyclomine     Blurry vision   Humira [Adalimumab]     LIPS SWELLED AND RASH   Metformin And Related     rash   Current Outpatient Medications on File Prior to Visit  Medication Sig Dispense Refill   Accu-Chek Softclix Lancets lancets Check glucose level daily and as needed for diabetes type 2  (lancets for the soft clicks lancing device AccuChek meter) 100 each 3   acetaminophen (TYLENOL) 325 MG tablet Take 650 mg by mouth every 6 (six) hours as needed.     albuterol (PROVENTIL HFA;VENTOLIN HFA) 108 (90 Base) MCG/ACT inhaler Inhale 2 puffs into the lungs every 4 (four) hours as needed  for wheezing or shortness of breath. Please give spacer if possible 1 Inhaler 3   amLODipine (NORVASC) 5 MG tablet TAKE 1 TABLET BY MOUTH EVERY DAY AT NOON (Patient taking differently: Take 5 mg by mouth daily.) 90 tablet 0   atorvastatin (LIPITOR) 10 MG tablet TAKE 1/2 TABLET BY MOUTH EVERY OTHER DAY 22 tablet 2   blood glucose meter kit and supplies To check glucose daily and prn for DM2 E11.9 1 each 0   chlorthalidone (HYGROTON) 25 MG tablet Take 1 tablet (25 mg total) by mouth daily. 90 tablet 1   dicyclomine (BENTYL) 10 MG/5ML solution Take 1-2 mLs (2-4 mg total) by mouth 4 (four) times daily -  before meals and at bedtime. Prev with blurry vision on higher dose but not an allergy and may be able to tolerate lower dose. 100 mL 3   docusate sodium (COLACE) 100 MG capsule Take 1 capsule (100 mg total) by mouth 2 (two) times daily. 60 capsule 0   EPINEPHrine (EPIPEN IJ) Inject as directed as needed. Reported on 12/05/2015     fluticasone (FLONASE) 50 MCG/ACT nasal spray PLACE 2 SPRAYS INTO THE NOSE DAILY. 48 g 3   folic acid (FOLVITE) 1 MG tablet Take 1 mg by mouth daily.     glipiZIDE (GLUCOTROL XL) 2.5 MG 24 hr tablet TAKE 1 TABLET BY MOUTH DAILY WITH BREAKFAST. 90 tablet 0   glucose blood (ACCU-CHEK GUIDE) test strip CHECK BLOOD SUGAR DAILY 100 strip 1   hydroxychloroquine (PLAQUENIL) 200 MG tablet Take 200 mg by mouth 2 (two) times daily.     loratadine (CLARITIN) 10 MG tablet Take 10 mg by mouth daily. MAY TAKE AT HS ALSO     methotrexate (RHEUMATREX) 2.5 MG tablet Take 15 mg by mouth once a week. Caution:Chemotherapy. Protect from light. ( Tuesday of each week )     pantoprazole (PROTONIX) 40 MG tablet TAKE 1 TABLET BY MOUTH EVERY DAY AT NOON 90 tablet 1   polyethylene glycol (MIRALAX / GLYCOLAX) packet Take 17 g by mouth daily.     predniSONE (DELTASONE) 5 MG tablet Take 5 mg by mouth daily.     triamcinolone cream (KENALOG) 0.1 % Apply 1 Application topically 2 (two) times daily. On  rash/affected area 30 g 0   No current facility-administered medications on file prior to visit.    Review of Systems  Constitutional:  Positive for fatigue. Negative for activity change, appetite change, fever and unexpected weight change.  HENT:  Negative for congestion, ear pain, rhinorrhea, sinus pressure and sore throat.   Eyes:  Negative for pain, redness and visual disturbance.  Respiratory:  Negative for cough, shortness of breath and wheezing.   Cardiovascular:  Negative for chest pain and palpitations.  Gastrointestinal:  Positive for nausea. Negative for abdominal distention, abdominal pain, anal bleeding, blood in stool, constipation, diarrhea, rectal pain and vomiting.       Stomach gripes when not eating  Unsure if pain   Endocrine: Negative for polydipsia and polyuria.  Genitourinary:  Negative for dysuria, frequency and urgency.  Musculoskeletal:  Positive for arthralgias. Negative for back pain and myalgias.  Skin:  Negative for pallor and rash.  Allergic/Immunologic: Negative for environmental allergies.  Neurological:  Negative for dizziness, syncope and headaches.  Hematological:  Negative for adenopathy. Does not bruise/bleed easily.  Psychiatric/Behavioral:  Negative for decreased concentration and dysphoric mood. The patient is not nervous/anxious.        Objective:   Physical Exam Constitutional:      General: She is not in acute distress.    Appearance: Normal appearance. She is well-developed. She is not ill-appearing or diaphoretic.     Comments: Pt seems fatigued but not acutely ill  Overweight   HENT:     Head: Normocephalic and atraumatic.     Mouth/Throat:     Mouth: Mucous membranes are moist.  Eyes:     General: No scleral icterus.    Conjunctiva/sclera: Conjunctivae normal.     Pupils: Pupils are equal, round, and reactive to light.  Neck:     Thyroid: No thyromegaly.     Vascular: No carotid bruit or JVD.  Cardiovascular:     Rate and  Rhythm: Regular rhythm. Tachycardia present.     Heart sounds: Normal heart sounds.     No gallop.     Comments: Rate in mid 90s Pulmonary:     Effort: Pulmonary effort is normal. No respiratory distress.     Breath sounds: Normal breath sounds. No stridor. No wheezing, rhonchi or rales.  Abdominal:     General: Abdomen is protuberant. Bowel sounds are normal. There is no distension or abdominal bruit.     Palpations: Abdomen is soft. There is no hepatomegaly, splenomegaly, mass or pulsatile mass.     Tenderness: There is abdominal tenderness in the epigastric area. There is no right CVA tenderness, left CVA tenderness, guarding or rebound.     Comments: Mild epigastric tenderness   Musculoskeletal:     Cervical back: Normal range of motion and neck supple.     Right lower leg: No edema.     Left lower leg: No edema.  Lymphadenopathy:     Cervical: No cervical adenopathy.  Skin:    General: Skin is warm and dry.     Coloration: Skin is not pale.     Findings: No rash.  Neurological:     Mental Status: She is alert.     Coordination: Coordination normal.     Deep Tendon Reflexes: Reflexes are normal and symmetric. Reflexes normal.  Psychiatric:        Mood and Affect: Mood normal.           Assessment & Plan:   Problem List Items Addressed This Visit       Cardiovascular and Mediastinum   Essential hypertension    Blood pressure is improved BP: 130/65   Urged to continue the chlorthalidone at 25 mg daily (this was increased last  time) Amlodipine 5 mg daily  Tolerating medications   Check at home when able  Labs today        Relevant Orders   Comprehensive metabolic panel (Completed)   CBC with Differential/Platelet (Completed)   POCT Urinalysis Dipstick (Automated) (Completed)     Endocrine   Diabetes mellitus treated with oral medication (HCC)    In pt with chronic prednisone for RA   Last A1c improved Lab Results  Component Value Date   HGBA1C 6.8  (H) 05/12/2023   Eating less/ less appetite with age also  Encouraged low glycemic diet with plenty of protein  Microalb utd  Eye exam utd  Taking atorvastatin          Musculoskeletal and Integument   RA (rheumatoid arthritis) (HCC)    Still struggling with pain  Has follow up with rheum early in sept Continues plaquenil and methotrexate         Other   Nausea - Primary    New, today  No vomiting or diarrhea  No fever or respiratory symptoms  Some abdominal griping (from not eating)  No history of gastroparesis  Takes ppi for GERD Has had ccy in past   Prescription zofran sent to pharmacy  Encouraged sips of fluids to prevent dehydration  Instructed to hold glipizide if not eating Lab today for cmet and cbc  Urinalysis pending  Will continue to follow  Discussed possible viral cause       Relevant Orders   Comprehensive metabolic panel (Completed)   CBC with Differential/Platelet (Completed)   POCT Urinalysis Dipstick (Automated) (Completed)   Urine Culture (Completed)   Abnormal urinalysis    Addendum- culture positive for klebsiella  Sent in keflex  Update if not improved       Relevant Orders   Urine Culture (Completed)

## 2023-05-27 NOTE — Assessment & Plan Note (Signed)
Still struggling with pain  Has follow up with rheum early in sept Continues plaquenil and methotrexate

## 2023-05-27 NOTE — Patient Instructions (Addendum)
On the days you don't eat- hold your glipizide   Lab and urine tests today for nausea  We will update you with results    I sent zofr[facility]for [facility]  Careful of sedation   Try sip fluids  When nausea improves- eat small amounts / bland food to start   Blood pressure is improved today

## 2023-05-31 MED ORDER — CEPHALEXIN 500 MG PO CAPS: 500.0000 mg | ORAL_CAPSULE | Freq: Two times a day (BID) | ORAL | 0 refills | Status: DC

## 2023-05-31 NOTE — Assessment & Plan Note (Signed)
Addendum- culture positive for klebsiella  Sent in keflex  Update if not improved

## 2023-05-31 NOTE — Addendum Note (Signed)
Addended by: Roxy Manns A on: 05/31/2023 11:17 AM   Modules accepted: Orders

## 2023-06-11 ENCOUNTER — Ambulatory Visit: Payer: Medicare Other | Admitting: Internal Medicine

## 2023-06-12 ENCOUNTER — Other Ambulatory Visit: Payer: Self-pay | Admitting: Family Medicine

## 2023-06-12 ENCOUNTER — Ambulatory Visit: Payer: Medicare Other | Admitting: Family Medicine

## 2023-06-12 ENCOUNTER — Encounter: Payer: Self-pay | Admitting: Family Medicine

## 2023-06-12 VITALS — BP 152/70 | HR 94 | Temp 99.0°F | Ht 67.0 in | Wt 182.5 lb

## 2023-06-12 DIAGNOSIS — S50861A Insect bite (nonvenomous) of right forearm, initial encounter: Secondary | ICD-10-CM | POA: Diagnosis not present

## 2023-06-12 DIAGNOSIS — W57XXXA Bitten or stung by nonvenomous insect and other nonvenomous arthropods, initial encounter: Secondary | ICD-10-CM

## 2023-06-12 MED ORDER — DOXYCYCLINE HYCLATE 100 MG PO TABS: 200.0000 mg | ORAL_TABLET | Freq: Once | ORAL | 0 refills | Status: AC

## 2023-06-12 MED ORDER — TRIAMCINOLONE ACETONIDE 0.1 % EX CREA: 1.0000 | TOPICAL_CREAM | Freq: Two times a day (BID) | CUTANEOUS | 0 refills | Status: DC

## 2023-06-12 NOTE — Assessment & Plan Note (Signed)
Acute, rapid skin reaction within 2 days of onset most consistent with allergic reaction to tick bite.  Less likely bacterial superinfection.  She is on medication that decreases her immune system and is unsure how long the tick was attached.  We will treat with topical triamcinolone cream for itching and allergic reaction.  I will cover her with doxycycline 200 mg x 1 for tickborne illness prevention as well as to treat any possible early bacterial infection of the skin.  Return and ER precautions provided

## 2023-06-12 NOTE — Patient Instructions (Signed)
Apply topical steroid cream to bite for allergic reaction and itching.  Will treat for tick borne illness prevention  with doxycycline 200 mg x 1   Call if symptoms of tick borne illness , fever or neurologic changes.

## 2023-06-12 NOTE — Progress Notes (Signed)
Patient ID: [name], female    DOB: February 01, 1941, 82 y.o.   MRN: [id number]  This visit was conducted in person.  BP (!) 152/70 (BP Location: Right Arm, Patient Position: Sitting, Cuff Size: Large)   Pulse 94   Temp 99 F (37.2 C) (Temporal)   Ht 5\' 7"  (1.702 m)   Wt 182 lb 8 oz (82.8 kg)   SpO2 95%   BMI 28.58 kg/m    CC:  Chief Complaint  Patient presents with   Insect Bite    Tick Bite on Wednesday-Right lower forearm   Halitosis    Subjective:   HPI: [name] is a 82 y.o. female presenting on 06/12/2023 for Insect Bite (Tick Bite on Wednesday-Right lower forearm) and Halitosis  New onset tick bite on right  forearm.  Noted tick  3 days ago.. husband pulled it off. Are itchy, noted new red bumps, redness  is spreading.  She has been applying  cortisone 10.  Earlier this summer had treatment for tick bite.   No fever. No new joint pain, no headache, no neck stiffness.  No other rash.     She has been having RA pain in knees and other joints... has appt 9/6.  On methotrexate and plaquenil. BP Readings from Last 3 Encounters:  06/12/23 (!) 152/70  05/27/23 130/65  05/12/23 (!) 154/80        Relevant past medical, surgical, family and social history reviewed and updated as indicated. Interim medical history since our last visit reviewed. Allergies and medications reviewed and updated. Outpatient Medications Prior to Visit  Medication Sig Dispense Refill   Accu-Chek Softclix Lancets lancets Check glucose level daily and as needed for diabetes type 2  (lancets for the soft clicks lancing device AccuChek meter) 100 each 3   acetaminophen (TYLENOL) 325 MG tablet Take 650 mg by mouth every 6 (six) hours as needed.     albuterol (PROVENTIL HFA;VENTOLIN HFA) 108 (90 Base) MCG/ACT inhaler Inhale 2 puffs into the lungs every 4 (four) hours as needed for wheezing or shortness of breath. Please give spacer if possible 1 Inhaler 3   amLODipine (NORVASC) 5 MG  tablet TAKE 1 TABLET BY MOUTH EVERY DAY AT NOON 90 tablet 0   atorvastatin (LIPITOR) 10 MG tablet TAKE 1/2 TABLET BY MOUTH EVERY OTHER DAY 22 tablet 2   blood glucose meter kit and supplies To check glucose daily and prn for DM2 E11.9 1 each 0   chlorthalidone (HYGROTON) 25 MG tablet Take 1 tablet (25 mg total) by mouth daily. 90 tablet 1   dicyclomine (BENTYL) 10 MG/5ML solution Take 1-2 mLs (2-4 mg total) by mouth 4 (four) times daily -  before meals and at bedtime. Prev with blurry vision on higher dose but not an allergy and may be able to tolerate lower dose. 100 mL 3   docusate sodium (COLACE) 100 MG capsule Take 1 capsule (100 mg total) by mouth 2 (two) times daily. 60 capsule 0   EPINEPHrine (EPIPEN IJ) Inject as directed as needed. Reported on 12/05/2015     fluticasone (FLONASE) 50 MCG/ACT nasal spray PLACE 2 SPRAYS INTO THE NOSE DAILY. 48 g 3   folic acid (FOLVITE) 1 MG tablet Take 1 mg by mouth daily.     glucose blood (ACCU-CHEK GUIDE) test strip CHECK BLOOD SUGAR DAILY 100 strip 1   hydroxychloroquine (PLAQUENIL) 200 MG tablet Take 200 mg by mouth 2 (two) times daily.  loratadine (CLARITIN) 10 MG tablet Take 10 mg by mouth daily. MAY TAKE AT HS ALSO     methotrexate (RHEUMATREX) 2.5 MG tablet Take 15 mg by mouth once a week. Caution:Chemotherapy. Protect from light. ( Tuesday of each week )     ondansetron (ZOFRAN) 4 MG tablet Take 1 tablet (4 mg total) by mouth every 8 (eight) hours as needed for nausea or vomiting. Caution of sedation 20 tablet 0   pantoprazole (PROTONIX) 40 MG tablet TAKE 1 TABLET BY MOUTH EVERY DAY AT NOON 90 tablet 1   polyethylene glycol (MIRALAX / GLYCOLAX) packet Take 17 g by mouth daily.     predniSONE (DELTASONE) 5 MG tablet Take 5 mg by mouth daily.     glipiZIDE (GLUCOTROL XL) 2.5 MG 24 hr tablet TAKE 1 TABLET BY MOUTH DAILY WITH BREAKFAST. 90 tablet 0   triamcinolone cream (KENALOG) 0.1 % Apply 1 Application topically 2 (two) times daily. On  rash/affected area 30 g 0   cephALEXin (KEFLEX) 500 MG capsule Take 1 capsule (500 mg total) by mouth 2 (two) times daily. 14 capsule 0   No facility-administered medications prior to visit.     Per HPI unless specifically indicated in ROS section below Review of Systems  Constitutional:  Negative for fatigue and fever.  HENT:  Negative for congestion.   Eyes:  Negative for pain.  Respiratory:  Negative for cough and shortness of breath.   Cardiovascular:  Negative for chest pain, palpitations and leg swelling.  Gastrointestinal:  Negative for abdominal pain.  Genitourinary:  Negative for dysuria and vaginal bleeding.  Musculoskeletal:  Negative for back pain.  Neurological:  Negative for syncope, light-headedness and headaches.  Psychiatric/Behavioral:  Negative for dysphoric mood.    Objective:  BP (!) 152/70 (BP Location: Right Arm, Patient Position: Sitting, Cuff Size: Large)   Pulse 94   Temp 99 F (37.2 C) (Temporal)   Ht 5\' 7"  (1.702 m)   Wt 182 lb 8 oz (82.8 kg)   SpO2 95%   BMI 28.58 kg/m   Wt Readings from Last 3 Encounters:  06/12/23 182 lb 8 oz (82.8 kg)  05/27/23 184 lb (83.5 kg)  05/12/23 189 lb (85.7 kg)      Physical Exam Constitutional:      General: She is not in acute distress.    Appearance: Normal appearance. She is well-developed. She is not ill-appearing or toxic-appearing.  HENT:     Head: Normocephalic.     Right Ear: Hearing, tympanic membrane, ear canal and external ear normal. Tympanic membrane is not erythematous, retracted or bulging.     Left Ear: Hearing, tympanic membrane, ear canal and external ear normal. Tympanic membrane is not erythematous, retracted or bulging.     Nose: No mucosal edema or rhinorrhea.     Right Sinus: No maxillary sinus tenderness or frontal sinus tenderness.     Left Sinus: No maxillary sinus tenderness or frontal sinus tenderness.     Mouth/Throat:     Mouth: Oropharynx is clear and moist and mucous membranes  are normal.     Pharynx: Uvula midline.  Eyes:     General: Lids are normal. Lids are everted, no foreign bodies appreciated.     Extraocular Movements: EOM normal.     Conjunctiva/sclera: Conjunctivae normal.     Pupils: Pupils are equal, round, and reactive to light.  Neck:     Thyroid: No thyroid mass or thyromegaly.     Vascular: No carotid bruit.  Trachea: Trachea normal.  Cardiovascular:     Rate and Rhythm: Normal rate and regular rhythm.     Pulses: Normal pulses.     Heart sounds: Normal heart sounds, S1 normal and S2 normal. No murmur heard.    No friction rub. No gallop.  Pulmonary:     Effort: Pulmonary effort is normal. No tachypnea or respiratory distress.     Breath sounds: Normal breath sounds. No decreased breath sounds, wheezing, rhonchi or rales.  Abdominal:     General: Bowel sounds are normal.     Palpations: Abdomen is soft.     Tenderness: There is no abdominal tenderness.  Musculoskeletal:     Cervical back: Normal range of motion and neck supple.  Skin:    General: Skin is warm, dry and intact.     Findings: Rash present.          Comments: See photo  Neurological:     Mental Status: She is alert.  Psychiatric:        Mood and Affect: Mood is not anxious or depressed.        Speech: Speech normal.        Behavior: Behavior normal. Behavior is cooperative.        Thought Content: Thought content normal.        Cognition and Memory: Cognition and memory normal.        Judgment: Judgment normal.       Results for orders placed or performed in visit on 05/27/23  Urine Culture   Specimen: Urine  Result Value Ref Range   MICRO NUMBER: [id number]    SPECIMEN QUALITY: Adequate    Sample Source URINE    STATUS: FINAL    ISOLATE 1: Klebsiella pneumoniae (A)       Susceptibility   Klebsiella pneumoniae - URINE CULTURE, REFLEX    AMOX/CLAVULANIC <=2 Sensitive     AMPICILLIN >=32 Resistant     AMPICILLIN/SULBACTAM 4 Sensitive     CEFAZOLIN* <=4  Not Reportable      * For infections other than uncomplicated UTI caused by E. coli, K. pneumoniae or P. mirabilis: Cefazolin is resistant if MIC > or = 8 mcg/mL. (Distinguishing susceptible versus intermediate for isolates with MIC < or = 4 mcg/mL requires additional testing.) For uncomplicated UTI caused by E. coli, K. pneumoniae or P. mirabilis: Cefazolin is susceptible if MIC <32 mcg/mL and predicts susceptible to the oral agents cefaclor, cefdinir, cefpodoxime, cefprozil, cefuroxime, cephalexin and loracarbef.     CEFTAZIDIME <=1 Sensitive     CEFEPIME <=1 Sensitive     CEFTRIAXONE <=1 Sensitive     CIPROFLOXACIN <=0.25 Sensitive     LEVOFLOXACIN <=0.12 Sensitive     GENTAMICIN <=1 Sensitive     IMIPENEM <=0.25 Sensitive     NITROFURANTOIN 64 Intermediate     PIP/TAZO <=4 Sensitive     TOBRAMYCIN <=1 Sensitive     TRIMETH/SULFA* <=20 Sensitive      * For infections other than uncomplicated UTI caused by E. coli, K. pneumoniae or P. mirabilis: Cefazolin is resistant if MIC > or = 8 mcg/mL. (Distinguishing susceptible versus intermediate for isolates with MIC < or = 4 mcg/mL requires additional testing.) For uncomplicated UTI caused by E. coli, K. pneumoniae or P. mirabilis: Cefazolin is susceptible if MIC <32 mcg/mL and predicts susceptible to the oral agents cefaclor, cefdinir, cefpodoxime, cefprozil, cefuroxime, cephalexin and loracarbef. Legend: S = Susceptible  I = Intermediate R = Resistant  NS =  Not susceptible * = Not tested  NR = Not reported **NN = See antimicrobic comments   Comprehensive metabolic panel  Result Value Ref Range   Sodium 138 135 - 145 mEq/L   Potassium 4.0 3.5 - 5.1 mEq/L   Chloride 101 96 - 112 mEq/L   CO2 26 19 - 32 mEq/L   Glucose, Bld 122 (H) 70 - 99 mg/dL   BUN 9 6 - 23 mg/dL   Creatinine, Ser 1.61 0.40 - 1.20 mg/dL   Total Bilirubin 0.5 0.2 - 1.2 mg/dL   Alkaline Phosphatase 58 39 - 117 U/L   AST 28 0 - 37 U/L   ALT 8 0 - 35  U/L   Total Protein 8.3 6.0 - 8.3 g/dL   Albumin 4.0 3.5 - 5.2 g/dL   GFR 09.60 >45.40 mL/min   Calcium 9.4 8.4 - 10.5 mg/dL  CBC with Differential/Platelet  Result Value Ref Range   WBC 6.5 4.0 - 10.5 K/uL   RBC 4.98 3.87 - 5.11 Mil/uL   Hemoglobin 13.9 12.0 - 15.0 g/dL   HCT 98.1 19.1 - 47.8 %   MCV 86.8 78.0 - 100.0 fl   MCHC 32.2 30.0 - 36.0 g/dL   RDW 29.5 (H) 62.1 - 30.8 %   Platelets 429.0 (H) 150.0 - 400.0 K/uL   Neutrophils Relative % 70.1 43.0 - 77.0 %   Lymphocytes Relative 25.7 12.0 - 46.0 %   Monocytes Relative 2.0 (L) 3.0 - 12.0 %   Eosinophils Relative 1.8 0.0 - 5.0 %   Basophils Relative 0.4 0.0 - 3.0 %   Neutro Abs 4.6 1.4 - 7.7 K/uL   Lymphs Abs 1.7 0.7 - 4.0 K/uL   Monocytes Absolute 0.1 0.1 - 1.0 K/uL   Eosinophils Absolute 0.1 0.0 - 0.7 K/uL   Basophils Absolute 0.0 0.0 - 0.1 K/uL  POCT Urinalysis Dipstick (Automated)  Result Value Ref Range   Color, UA Dark Amber    Clarity, UA Cloudy    Glucose, UA Negative Negative   Bilirubin, UA 2 mg/dL    Ketones, UA 5 mg/dL    Spec Grav, UA >=6.578 (A) 1.010 - 1.025   Blood, UA Negative    pH, UA 6.0 5.0 - 8.0   Protein, UA Positive (A) Negative   Urobilinogen, UA 2.0 (A) 0.2 or 1.0 E.U./dL   Nitrite, UA Positive (A)    Leukocytes, UA Small (1+) (A) Negative    Assessment and Plan  Tick bite of right forearm, initial encounter Assessment & Plan: Acute, rapid skin reaction within 2 days of onset most consistent with allergic reaction to tick bite.  Less likely bacterial superinfection.  She is on medication that decreases her immune system and is unsure how long the tick was attached.  We will treat with topical triamcinolone cream for itching and allergic reaction.  I will cover her with doxycycline 200 mg x 1 for tickborne illness prevention as well as to treat any possible early bacterial infection of the skin.  Return and ER precautions provided   Other orders -     Triamcinolone Acetonide; Apply 1  Application topically 2 (two) times daily. On rash/affected area  Dispense: 15 g; Refill: 0 -     Doxycycline Hyclate; Take 2 tablets (200 mg total) by mouth once for 1 dose.  Dispense: 2 tablet; Refill: 0    Return if symptoms worsen or fail to improve.   Kerby Nora, MD

## 2023-07-06 ENCOUNTER — Telehealth: Payer: Self-pay | Admitting: Family Medicine

## 2023-07-06 NOTE — Telephone Encounter (Signed)
She had rheumatoid arthritis and unsure if this is cause or if it is from osteoarthritis  Best handled by her rheumatologist /or orthopedics if she has one   Has she d/w specialists?

## 2023-07-06 NOTE — Telephone Encounter (Signed)
Patient husband called in and stated that [name] has been dealing with arthritis in her neck. They was wanting to know if something could be called in for her to take. Please advise. Thank you!

## 2023-07-06 NOTE — Telephone Encounter (Signed)
Spoke with spouse he said pt has an appt this week with rheumatologist so he will have her discuss pain/meds with them.

## 2023-07-10 ENCOUNTER — Encounter: Payer: Medicare Other | Admitting: Pharmacist

## 2023-07-11 ENCOUNTER — Other Ambulatory Visit: Payer: Self-pay | Admitting: Family Medicine

## 2023-07-12 ENCOUNTER — Other Ambulatory Visit: Payer: Self-pay | Admitting: Family Medicine

## 2023-07-20 ENCOUNTER — Telehealth: Payer: Self-pay | Admitting: Family Medicine

## 2023-07-20 NOTE — Telephone Encounter (Signed)
Patient dropped off document FMLA, to be filled out by provider. Patient requested to send it back via Call Patient to pick up within 5-days. Document is located in providers tray at front office.Please advise at Mobile [phone] (mobile)

## 2023-07-20 NOTE — Telephone Encounter (Signed)
Wrong pt, it's pt's daughter who dropped off FMLA forms

## 2023-08-26 ENCOUNTER — Ambulatory Visit: Payer: Medicare Other | Admitting: Family Medicine

## 2023-08-26 ENCOUNTER — Encounter: Payer: Self-pay | Admitting: Family Medicine

## 2023-08-26 ENCOUNTER — Ambulatory Visit (INDEPENDENT_AMBULATORY_CARE_PROVIDER_SITE_OTHER)
Admission: RE | Admit: 2023-08-26 | Discharge: 2023-08-26 | Disposition: A | Discharge status: Home / Self Care | Payer: Medicare Other | Source: Ambulatory Visit | Attending: Family Medicine | Admitting: Family Medicine

## 2023-08-26 VITALS — BP 134/70 | HR 98 | Temp 98.4°F | Ht 67.0 in | Wt 165.4 lb

## 2023-08-26 DIAGNOSIS — R059 Cough, unspecified: Secondary | ICD-10-CM | POA: Diagnosis not present

## 2023-08-26 DIAGNOSIS — I10 Essential (primary) hypertension: Secondary | ICD-10-CM | POA: Diagnosis not present

## 2023-08-26 DIAGNOSIS — M069 Rheumatoid arthritis, unspecified: Secondary | ICD-10-CM

## 2023-08-26 DIAGNOSIS — R1033 Periumbilical pain: Secondary | ICD-10-CM | POA: Diagnosis not present

## 2023-08-26 DIAGNOSIS — R10815 Periumbilic abdominal tenderness: Secondary | ICD-10-CM

## 2023-08-26 DIAGNOSIS — Z87440 Personal history of urinary (tract) infections: Secondary | ICD-10-CM | POA: Diagnosis not present

## 2023-08-26 DIAGNOSIS — R051 Acute cough: Secondary | ICD-10-CM | POA: Diagnosis not present

## 2023-08-26 DIAGNOSIS — R634 Abnormal weight loss: Secondary | ICD-10-CM | POA: Diagnosis not present

## 2023-08-26 DIAGNOSIS — R5382 Chronic fatigue, unspecified: Secondary | ICD-10-CM | POA: Diagnosis not present

## 2023-08-26 DIAGNOSIS — R109 Unspecified abdominal pain: Secondary | ICD-10-CM | POA: Insufficient documentation

## 2023-08-26 DIAGNOSIS — J189 Pneumonia, unspecified organism: Secondary | ICD-10-CM | POA: Insufficient documentation

## 2023-08-26 DIAGNOSIS — R42 Dizziness and giddiness: Secondary | ICD-10-CM | POA: Insufficient documentation

## 2023-08-26 DIAGNOSIS — R10819 Abdominal tenderness, unspecified site: Secondary | ICD-10-CM | POA: Insufficient documentation

## 2023-08-26 DIAGNOSIS — R8290 Unspecified abnormal findings in urine: Secondary | ICD-10-CM | POA: Diagnosis not present

## 2023-08-26 DIAGNOSIS — R630 Anorexia: Secondary | ICD-10-CM | POA: Insufficient documentation

## 2023-08-26 LAB — CBC WITH DIFFERENTIAL/PLATELET
Basophils Absolute: 0 10*3/uL (ref 0.0–0.1)
Basophils Relative: 0.5 % (ref 0.0–3.0)
Eosinophils Absolute: 0.1 10*3/uL (ref 0.0–0.7)
Eosinophils Relative: 2 % (ref 0.0–5.0)
HCT: 40.1 % (ref 36.0–46.0)
Hemoglobin: 12.8 g/dL (ref 12.0–15.0)
Lymphocytes Relative: 27.8 % (ref 12.0–46.0)
Lymphs Abs: 1.8 10*3/uL (ref 0.7–4.0)
MCHC: 31.9 g/dL (ref 30.0–36.0)
MCV: 84.7 fL (ref 78.0–100.0)
Monocytes Absolute: 0.2 10*3/uL (ref 0.1–1.0)
Monocytes Relative: 2.8 % — ABNORMAL LOW (ref 3.0–12.0)
Neutro Abs: 4.4 10*3/uL (ref 1.4–7.7)
Neutrophils Relative %: 66.9 % (ref 43.0–77.0)
Platelets: 463 10*3/uL — ABNORMAL HIGH (ref 150.0–400.0)
RBC: 4.74 Mil/uL (ref 3.87–5.11)
RDW: 15.6 % — ABNORMAL HIGH (ref 11.5–15.5)
WBC: 6.6 10*3/uL (ref 4.0–10.5)

## 2023-08-26 LAB — COMPREHENSIVE METABOLIC PANEL
ALT: 10 U/L (ref 0–35)
AST: 28 U/L (ref 0–37)
Albumin: 3.6 g/dL (ref 3.5–5.2)
Alkaline Phosphatase: 64 U/L (ref 39–117)
BUN: 8 mg/dL (ref 6–23)
CO2: 29 meq/L (ref 19–32)
Calcium: 9 mg/dL (ref 8.4–10.5)
Chloride: 101 meq/L (ref 96–112)
Creatinine, Ser: 0.73 mg/dL (ref 0.40–1.20)
GFR: 77.19 mL/min (ref >60.00)
Glucose, Bld: 109 mg/dL — ABNORMAL HIGH (ref 70–99)
Potassium: 3.2 meq/L — ABNORMAL LOW (ref 3.5–5.1)
Sodium: 140 meq/L (ref 135–145)
Total Bilirubin: 0.6 mg/dL (ref 0.2–1.2)
Total Protein: 7.7 g/dL (ref 6.0–8.3)

## 2023-08-26 LAB — POC COVID19 BINAXNOW: SARS Coronavirus 2 Ag: NEGATIVE

## 2023-08-26 LAB — TSH: TSH: 1.44 u[IU]/mL (ref 0.35–5.50)

## 2023-08-26 LAB — LIPASE: Lipase: 31 U/L (ref 11.0–59.0)

## 2023-08-26 MED ORDER — DOXYCYCLINE HYCLATE 100 MG PO TABS: 100.0000 mg | ORAL_TABLET | Freq: Two times a day (BID) | ORAL | 0 refills | Status: DC

## 2023-08-26 NOTE — Patient Instructions (Signed)
Drink fluids  Aim for 60 oz per day if possible   Lab today  Urinalysis today  Chest xray today   When results return we will make a plan  In the meantime if you feel worse-go to the ER

## 2023-08-26 NOTE — Assessment & Plan Note (Signed)
Periumbilical Labs pending  Also weight loss   Consider imaging if not improved

## 2023-08-26 NOTE — Assessment & Plan Note (Signed)
See a/p for abd tenderness

## 2023-08-26 NOTE — Assessment & Plan Note (Signed)
Per pt pain is almost too severe to function  She could not make her rheumatology appointment due to pain too severe to leave house On plaquenil, methotrexate and prednisone   Strongly urged her to call and update rheumatologist

## 2023-08-26 NOTE — Assessment & Plan Note (Signed)
Per pt about 2 weeks Intermittent spinning feeling  Had headache last week/ this is now improved May have atypical pna  May be dehydrated Neg covid testing today   Reassuring exam  Will treat for above and follow up 1 week Labs and urinalysis also pending

## 2023-08-26 NOTE — Assessment & Plan Note (Signed)
Started with nausea Now loss of appetite  Has lost 17 lb since August   Some periumb abd tenderness on exam   Labs and urinalysis pending

## 2023-08-26 NOTE — Assessment & Plan Note (Signed)
For about a week /now not severe  Had congestion and sneezing  Headache last week-resolved  No fever  Fatigue and loss of appetite  Negative covid test  Cxr notes interstitial opacity - cannot r/o atypical pna (vs edema) Will treat with doxycycline for possible atypical pna  Pt already takes daily prednisone for RA Follow up in a week Update if not starting to improve in a week or if worsening  Call back and Er precautions noted in detail today   Pt and husb voiced understanding

## 2023-08-26 NOTE — Assessment & Plan Note (Signed)
May be multifactorial in setting of chronic pain from RA and recent nausea (resolved now) followed by loss of appetite  Also cough and possible atypical pna  Exam notes some pallor, weight loss and some periumbilical abd tenderness Will treat with doxycycline  Labs ordered Urinalysis (to bring back since she cannot give sample here) No doubt dehydrated- encouraged fluids

## 2023-08-26 NOTE — Progress Notes (Signed)
Subjective:    Patient ID: [name], female    DOB: [birth year], 82 y.o.   MRN: [id number]  HPI  Wt Readings from Last 3 Encounters:  08/26/23 165 lb 6 oz (75 kg)  06/12/23 182 lb 8 oz (82.8 kg)  05/27/23 184 lb (83.5 kg)   25.90 kg/m  Vitals:   08/26/23 1047  BP: 134/70  Pulse: 98  Temp: 98.4 F (36.9 C)  SpO2: 96%   Pt presents for c/o cough  Cough for a week , she sweat a lot  Not productive -now improved  Also sneezing (but no runny nose) , a little congestion  Thought it was a cold  Nausea - that turned into lack of appetite - that lasted about 2 days  2 episodes of diarrhea-no more  Loss of appetite-about a month   Had headache last week - right frontal  That is resolved   Husb had a cold before she got it   Had tick bite on right arm in aug- saw Dr Ermalene Searing  Was treatment with doxycycline   Dizziness - about 2 weeks / spinning feeling -worse when turning head and changing position  Tends to lie down all day Weight loss -due to not eating   Temp - was fine/no fever    Treated for klebsiella uti last time (August) - no urinary symptoms now  She was nauseated We sent in zofran   HTN bp is stable today  No cp or palpitations or headaches or edema  No side effects to medicines  BP Readings from Last 3 Encounters:  08/26/23 134/70  06/12/23 (!) 152/70  05/27/23 130/65    Chlorthalidone 25 mg daily  Amlodipine 5 mg daily   Lab Results  Component Value Date   NA 140 08/26/2023   K 3.2 (L) 08/26/2023   CO2 29 08/26/2023   GLUCOSE 109 (H) 08/26/2023   BUN 8 08/26/2023   CREATININE 0.73 08/26/2023   CALCIUM 9.0 08/26/2023   GFR 77.19 08/26/2023   GFRNONAA >60 01/26/2017     For RA Methotrexate Plaquenil Prednisone  Did not go to last visit due to too much pain to put clothes of   Protonix for gerd   Covid test negative today  Cxr DG Chest 2 View  Result Date: 08/26/2023 CLINICAL DATA:  Cough, weight loss EXAM: CHEST - 2 VIEW  COMPARISON:  06/09/2016 FINDINGS: The heart size and mediastinal contours are within normal limits. Mild diffuse bilateral interstitial opacity. Disc degenerative disease of the thoracic spine. IMPRESSION: Mild diffuse bilateral interstitial opacity, consistent with edema or atypical/viral infection. No focal airspace opacity. Electronically Signed   By: Jearld Lesch M.D.   On: 08/26/2023 13:12      Patient Active Problem List   Diagnosis Date Noted   Dizziness 08/26/2023   Decreased appetite 08/26/2023   Abdominal tenderness 08/26/2023   Abdominal pain 08/26/2023   History of UTI 08/26/2023   Atypical pneumonia 08/26/2023   Nausea 05/27/2023   Tick bite of right forearm 02/06/2023   Breast pain, left 01/06/2023   Contusion of right great toe with damage to nail 01/06/2023   Heart murmur 08/27/2022   Mobility impaired 07/22/2022   Current use of proton pump inhibitor 10/01/2021   General weakness 10/01/2021   Poor balance 10/01/2021   On prednisone therapy 10/01/2021   Diabetes mellitus treated with oral medication (HCC) 11/13/2020   Hyperlipidemia associated with type 2 diabetes mellitus (HCC) 10/15/2020   Pre-op exam 06/01/2020  Abnormal urinalysis 04/03/2020   Fatigue 01/30/2020   TMJ (dislocation of temporomandibular joint) 04/29/2019   Encounter for screening mammogram for breast cancer 10/27/2017   Routine general medical examination at a health care facility 10/27/2017   Aortic atherosclerosis (HCC) 02/03/2017   RA (rheumatoid arthritis) (HCC)    Rash 08/28/2016   Adjustment disorder with mixed anxiety and depressed mood 06/24/2016   Cough 06/09/2016   Essential hypertension 01/07/2016   Coronary atherosclerosis 11/07/2015   Osteoarthritis 05/10/2014   GERD 07/09/2007   Past Medical History:  Diagnosis Date   Allergic rhinitis    Cataract 2015   bilateral   COVID-19 10/2019   ASYMPTOMATIC   Diabetes mellitus without complication (HCC)    Diverticulosis     Esophageal reflux    Essential hypertension 01/07/2016   Fatty liver 2018   Fibula fracture 02/2005   Former smoker    Gastritis 2003   Heart murmur    MILD, NO CARDIOLOGIST   Hemorrhoids    History of cardiac dysrhythmia 2011   ABLATION  AT [facility]   History of kidney stones YRS AGO   Hyperglycemia    Hyperlipemia    Osteoarthritis    Personal history of colonic polyps 1998   hyperplastic   PMB (postmenopausal bleeding)    Pre-diabetes    BORDERLINE DIET CONTROLLED DOES NOT CHECK CBG   RA (rheumatoid arthritis) (HCC)    Rectal bleed 2018   TRANSFUSION GIVEN   Shingles 2012   Uterine fibroid    Past Surgical History:  Procedure Laterality Date   CHOLECYSTECTOMY  YRS AGO   COLONOSCOPY  11/08   internal hemorrhoids   DILATATION & CURETTAGE/HYSTEROSCOPY WITH MYOSURE N/A 07/06/2020   Procedure: DILATATION & CURETTAGE/HYSTEROSCOPY;  Surgeon: Theresia Majors, MD;  Location: [facility] [place];  Service: Gynecology;  Laterality: N/A;  request 7:30am OR time in [place] Gyn block requests 30 minutes   EP procedure  1994   SVT; normal echo   ESOPHAGOGASTRODUODENOSCOPY  11/03   reactive gastropathy   EYE SURGERY Bilateral 2017   CATARACTS   LIPOMA EXCISION  11/2002   SVT s/p surgery--? ablation  2011   AT [facility]   TUBAL LIGATION  YRS AGO   vaginal polyp excised  12/2000   benign   Social History   Tobacco Use   Smoking status: Former    Current packs/day: 0.00    Average packs/day: 0.8 packs/day for 15.0 years (11.3 ttl pk-yrs)    Types: Cigarettes    Start date: 10/20/1988    Quit date: 10/21/2003    Years since quitting: 19.8   Smokeless tobacco: Never  Vaping Use   Vaping status: Never Used  Substance Use Topics   Alcohol use: No    Alcohol/week: 0.0 standard drinks of alcohol   Drug use: No   Family History  Problem Relation Age of Onset   Colon cancer Father 33   Diabetes Mother    Heart disease Mother    Kidney disease Mother        ESRD    Stroke Mother    Hypertension Brother    Breast cancer Sister 30   Diabetes Other        nephews   Colon cancer Sister    Esophageal cancer Neg Hx    Stomach cancer Neg Hx    Rectal cancer Neg Hx    Allergies  Allergen Reactions   Bee Venom Anaphylaxis   Dicyclomine     Blurry vision  Humira [Adalimumab]     LIPS SWELLED AND RASH   Metformin And Related     rash   Current Outpatient Medications on File Prior to Visit  Medication Sig Dispense Refill   Accu-Chek Softclix Lancets lancets Check glucose level daily and as needed for diabetes type 2  (lancets for the soft clicks lancing device AccuChek meter) 100 each 3   acetaminophen (TYLENOL) 325 MG tablet Take 650 mg by mouth every 6 (six) hours as needed.     albuterol (PROVENTIL HFA;VENTOLIN HFA) 108 (90 Base) MCG/ACT inhaler Inhale 2 puffs into the lungs every 4 (four) hours as needed for wheezing or shortness of breath. Please give spacer if possible 1 Inhaler 3   amLODipine (NORVASC) 5 MG tablet TAKE 1 TABLET BY MOUTH EVERY DAY AT NOON 90 tablet 0   atorvastatin (LIPITOR) 10 MG tablet TAKE 1/2 TABLET BY MOUTH EVERY OTHER DAY 22 tablet 2   blood glucose meter kit and supplies To check glucose daily and prn for DM2 E11.9 1 each 0   chlorthalidone (HYGROTON) 25 MG tablet Take 1 tablet (25 mg total) by mouth daily. 90 tablet 1   dicyclomine (BENTYL) 10 MG/5ML solution Take 1-2 mLs (2-4 mg total) by mouth 4 (four) times daily -  before meals and at bedtime. Prev with blurry vision on higher dose but not an allergy and may be able to tolerate lower dose. 100 mL 3   docusate sodium (COLACE) 100 MG capsule Take 1 capsule (100 mg total) by mouth 2 (two) times daily. 60 capsule 0   EPINEPHrine (EPIPEN IJ) Inject as directed as needed. Reported on 12/05/2015     fluticasone (FLONASE) 50 MCG/ACT nasal spray PLACE 2 SPRAYS INTO THE NOSE DAILY. 48 g 3   folic acid (FOLVITE) 1 MG tablet Take 1 mg by mouth daily.     glipiZIDE (GLUCOTROL XL) 2.5  MG 24 hr tablet TAKE 1 TABLET BY MOUTH EVERY DAY WITH BREAKFAST 90 tablet 0   glucose blood (ACCU-CHEK GUIDE) test strip USE TO CHECK BLOOD SUGAR DAILY 100 strip 1   hydroxychloroquine (PLAQUENIL) 200 MG tablet Take 200 mg by mouth 2 (two) times daily.     loratadine (CLARITIN) 10 MG tablet Take 10 mg by mouth daily. MAY TAKE AT HS ALSO     methotrexate (RHEUMATREX) 2.5 MG tablet Take 15 mg by mouth once a week. Caution:Chemotherapy. Protect from light. ( Tuesday of each week )     ondansetron (ZOFRAN) 4 MG tablet Take 1 tablet (4 mg total) by mouth every 8 (eight) hours as needed for nausea or vomiting. Caution of sedation 20 tablet 0   pantoprazole (PROTONIX) 40 MG tablet TAKE 1 TABLET BY MOUTH EVERY DAY AT NOON 90 tablet 1   polyethylene glycol (MIRALAX / GLYCOLAX) packet Take 17 g by mouth daily.     predniSONE (DELTASONE) 5 MG tablet Take 5 mg by mouth daily.     triamcinolone cream (KENALOG) 0.1 % Apply 1 Application topically 2 (two) times daily. On rash/affected area 15 g 0   No current facility-administered medications on file prior to visit.    Review of Systems  Constitutional:  Positive for fatigue. Negative for activity change, appetite change, fever and unexpected weight change.  HENT:  Positive for congestion and rhinorrhea. Negative for ear pain, sinus pressure, sore throat, trouble swallowing and voice change.   Eyes:  Negative for pain, redness and visual disturbance.  Respiratory:  Positive for cough. Negative for chest  tightness, shortness of breath, wheezing and stridor.   Cardiovascular:  Negative for chest pain and palpitations.  Gastrointestinal:  Positive for abdominal pain. Negative for blood in stool, constipation, diarrhea, nausea, rectal pain and vomiting.       Nausea is replaced by loss of appetite No diarrhea   Endocrine: Negative for polydipsia and polyuria.  Genitourinary:  Negative for dysuria, frequency and urgency.  Musculoskeletal:  Negative for  arthralgias, back pain and myalgias.  Skin:  Negative for pallor and rash.  Allergic/Immunologic: Negative for environmental allergies.  Neurological:  Positive for dizziness and headaches. Negative for tremors, seizures, syncope, facial asymmetry, speech difficulty, light-headedness and numbness.       Weak all over  Not focal   Hematological:  Negative for adenopathy. Does not bruise/bleed easily.  Psychiatric/Behavioral:  Negative for decreased concentration and dysphoric mood. The patient is not nervous/anxious.        Objective:   Physical Exam Constitutional:      General: She is not in acute distress.    Appearance: Normal appearance. She is well-developed and normal weight. She is not diaphoretic.     Comments: Pt appears very fatigue   HENT:     Head: Normocephalic and atraumatic.     Right Ear: Tympanic membrane, ear canal and external ear normal.     Left Ear: Tympanic membrane, ear canal and external ear normal.     Nose: Congestion and rhinorrhea present.     Mouth/Throat:     Mouth: Mucous membranes are moist.     Pharynx: Oropharynx is clear.     Comments: Clear pnd  Eyes:     General: No scleral icterus.       Right eye: No discharge.        Left eye: No discharge.     Conjunctiva/sclera: Conjunctivae normal.     Pupils: Pupils are equal, round, and reactive to light.  Neck:     Thyroid: No thyromegaly.     Vascular: No carotid bruit or JVD.  Cardiovascular:     Rate and Rhythm: Regular rhythm. Tachycardia present.     Heart sounds: Murmur heard.     No gallop.  Pulmonary:     Effort: Pulmonary effort is normal. No respiratory distress.     Breath sounds: Normal breath sounds. No stridor. No wheezing, rhonchi or rales.     Comments: Bs are harsh in bases No wheeze even with forced exp Abdominal:     General: Abdomen is flat. Bowel sounds are normal. There is no distension or abdominal bruit.     Palpations: Abdomen is soft. There is no fluid wave,  hepatomegaly, splenomegaly, mass or pulsatile mass.     Tenderness: There is abdominal tenderness in the periumbilical area. There is no right CVA tenderness, left CVA tenderness, guarding or rebound. Negative signs include Murphy's sign and McBurney's sign.     Hernia: No hernia is present.  Musculoskeletal:     Cervical back: Normal range of motion and neck supple. No tenderness.     Right lower leg: No edema.     Left lower leg: No edema.  Lymphadenopathy:     Cervical: No cervical adenopathy.  Skin:    General: Skin is warm and dry.     Coloration: Skin is not pale.     Findings: No rash.  Neurological:     Mental Status: She is alert.     Cranial Nerves: No cranial nerve deficit.  Motor: No weakness.     Coordination: Coordination normal.     Deep Tendon Reflexes: Reflexes are normal and symmetric. Reflexes normal.     Comments: Gait is unsteady due to dizziness and pain   Psychiatric:        Mood and Affect: Mood is depressed.           Assessment & Plan:   Problem List Items Addressed This Visit       Cardiovascular and Mediastinum   Essential hypertension    Blood pressure is improved BP: 134/70   Urged to continue the chlorthalidone at 25 mg daily  Amlodipine 5 mg daily  Tolerating medications   Check at home when able  Labs today        Relevant Orders   Comprehensive metabolic panel (Completed)   CBC with Differential/Platelet (Completed)   TSH (Completed)     Respiratory   Atypical pneumonia - Primary    Interstitial opacity on cxr with malaise and cough  Treatment with doxycycline Follow up 1 wk   Call back and Er precautions noted in detail today         Relevant Medications   doxycycline (VIBRA-TABS) 100 MG tablet     Musculoskeletal and Integument   RA (rheumatoid arthritis) (HCC)    Per pt pain is almost too severe to function  She could not make her rheumatology appointment due to pain too severe to leave house On plaquenil,  methotrexate and prednisone   Strongly urged her to call and update rheumatologist         Genitourinary   History of UTI    Urinalysis ordered  Pt could not give sample-instructed to bring one back       Relevant Orders   POCT Urinalysis Dipstick (Automated)     Other   Abdominal pain    See a/p for abd tenderness       Relevant Orders   Comprehensive metabolic panel (Completed)   CBC with Differential/Platelet (Completed)   Lipase (Completed)   Abdominal tenderness    Periumbilical Labs pending  Also weight loss   Consider imaging if not improved       Cough    For about a week /now not severe  Had congestion and sneezing  Headache last week-resolved  No fever  Fatigue and loss of appetite  Negative covid test  Cxr notes interstitial opacity - cannot r/o atypical pna (vs edema) Will treat with doxycycline for possible atypical pna  Pt already takes daily prednisone for RA Follow up in a week Update if not starting to improve in a week or if worsening  Call back and Er precautions noted in detail today   Pt and husb voiced understanding       Relevant Orders   POC COVID-19 (Completed)   DG Chest 2 View (Completed)   Decreased appetite    Started with nausea Now loss of appetite  Has lost 17 lb since August   Some periumb abd tenderness on exam   Labs and urinalysis pending       Dizziness    Per pt about 2 weeks Intermittent spinning feeling  Had headache last week/ this is now improved May have atypical pna  May be dehydrated Neg covid testing today   Reassuring exam  Will treat for above and follow up 1 week Labs and urinalysis also pending       Relevant Orders   Comprehensive metabolic panel (Completed)  CBC with Differential/Platelet (Completed)   TSH (Completed)   Fatigue    May be multifactorial in setting of chronic pain from RA and recent nausea (resolved now) followed by loss of appetite  Also cough and possible atypical pna   Exam notes some pallor, weight loss and some periumbilical abd tenderness Will treat with doxycycline  Labs ordered Urinalysis (to bring back since she cannot give sample here) No doubt dehydrated- encouraged fluids

## 2023-08-26 NOTE — Assessment & Plan Note (Signed)
Interstitial opacity on cxr with malaise and cough  Treatment with doxycycline Follow up 1 wk   Call back and Er precautions noted in detail today

## 2023-08-26 NOTE — Assessment & Plan Note (Signed)
Urinalysis ordered  Pt could not give sample-instructed to bring one back

## 2023-08-26 NOTE — Assessment & Plan Note (Signed)
Blood pressure is improved BP: 134/70   Urged to continue the chlorthalidone at 25 mg daily  Amlodipine 5 mg daily  Tolerating medications   Check at home when able  Labs today

## 2023-08-27 ENCOUNTER — Telehealth: Payer: Self-pay | Admitting: *Deleted

## 2023-08-27 DIAGNOSIS — R8290 Unspecified abnormal findings in urine: Secondary | ICD-10-CM | POA: Diagnosis not present

## 2023-08-27 LAB — POC URINALSYSI DIPSTICK (AUTOMATED)
Bilirubin, UA: 2
Glucose, UA: NEGATIVE
Ketones, UA: 5
Nitrite, UA: NEGATIVE
Protein, UA: POSITIVE — AB
Spec Grav, UA: >=1.03 — AB (ref 1.010–1.025)
Urobilinogen, UA: 1 U/dL
pH, UA: 5.5 (ref 5.0–8.0)

## 2023-08-27 MED ORDER — POTASSIUM CHLORIDE CRYS ER 10 MEQ PO TBCR: 10.0000 meq | EXTENDED_RELEASE_TABLET | Freq: Every day | ORAL | 0 refills | Status: DC

## 2023-08-27 NOTE — Telephone Encounter (Signed)
Pt notified of lab results and Dr. Tower's comments Rx sent to pharmacy  

## 2023-08-27 NOTE — Telephone Encounter (Signed)
-----   Message from Lonoke sent at 08/26/2023  7:37 PM EST ----- Labs are reassuring overall  Potassium is low - maybe due to reduced intake and also the chlorthalidone blood pressure med  Please send in klor con 10 meq  1 po every day with full stomach #30 0 refills  We will re check this at your 1 week follow up  Liver and kidney labs are normal  Chemistries are normal  White blood cell count is not elevated and no anemia (reassuring) Normal pancreas enzyme Normal thyroid screen   Still pending urinalysis-bring this back asap please   Will review further at follow up appointment in a week for pneumonia

## 2023-08-27 NOTE — Addendum Note (Signed)
Addended by: Shon Millet on: 08/27/2023 10:06 AM   Modules accepted: Orders

## 2023-08-29 LAB — URINE CULTURE
MICRO NUMBER:: 15700263
SPECIMEN QUALITY:: ADEQUATE

## 2023-08-30 MED ORDER — NITROFURANTOIN MONOHYD MACRO 100 MG PO CAPS: 100.0000 mg | ORAL_CAPSULE | Freq: Two times a day (BID) | ORAL | 0 refills | Status: DC

## 2023-08-30 NOTE — Assessment & Plan Note (Signed)
Klebsiella uti on culture Will start macrobid for this  Hope this will help malaise

## 2023-08-30 NOTE — Addendum Note (Signed)
Addended by: Roxy Manns A on: 08/30/2023 02:23 PM   Modules accepted: Orders

## 2023-09-04 ENCOUNTER — Encounter: Payer: Self-pay | Admitting: Family Medicine

## 2023-09-04 ENCOUNTER — Ambulatory Visit (INDEPENDENT_AMBULATORY_CARE_PROVIDER_SITE_OTHER): Payer: Medicare Other | Admitting: Family Medicine

## 2023-09-04 VITALS — BP 132/66 | HR 103 | Temp 97.5°F | Ht 67.0 in | Wt 165.4 lb

## 2023-09-04 DIAGNOSIS — J189 Pneumonia, unspecified organism: Secondary | ICD-10-CM

## 2023-09-04 DIAGNOSIS — I10 Essential (primary) hypertension: Secondary | ICD-10-CM | POA: Diagnosis not present

## 2023-09-04 DIAGNOSIS — B9689 Other specified bacterial agents as the cause of diseases classified elsewhere: Secondary | ICD-10-CM | POA: Diagnosis not present

## 2023-09-04 DIAGNOSIS — R4589 Other symptoms and signs involving emotional state: Secondary | ICD-10-CM

## 2023-09-04 DIAGNOSIS — E119 Type 2 diabetes mellitus without complications: Secondary | ICD-10-CM

## 2023-09-04 DIAGNOSIS — N390 Urinary tract infection, site not specified: Secondary | ICD-10-CM | POA: Diagnosis not present

## 2023-09-04 DIAGNOSIS — Z7984 Long term (current) use of oral hypoglycemic drugs: Secondary | ICD-10-CM

## 2023-09-04 DIAGNOSIS — M069 Rheumatoid arthritis, unspecified: Secondary | ICD-10-CM | POA: Diagnosis not present

## 2023-09-04 DIAGNOSIS — R5382 Chronic fatigue, unspecified: Secondary | ICD-10-CM

## 2023-09-04 DIAGNOSIS — E876 Hypokalemia: Secondary | ICD-10-CM | POA: Insufficient documentation

## 2023-09-04 DIAGNOSIS — K219 Gastro-esophageal reflux disease without esophagitis: Secondary | ICD-10-CM

## 2023-09-04 DIAGNOSIS — E1169 Type 2 diabetes mellitus with other specified complication: Secondary | ICD-10-CM

## 2023-09-04 LAB — BASIC METABOLIC PANEL
BUN: 10 mg/dL (ref 6–23)
CO2: 27 meq/L (ref 19–32)
Calcium: 9.2 mg/dL (ref 8.4–10.5)
Chloride: 102 meq/L (ref 96–112)
Creatinine, Ser: 0.83 mg/dL (ref 0.40–1.20)
GFR: 66.16 mL/min (ref >60.00)
Glucose, Bld: 121 mg/dL — ABNORMAL HIGH (ref 70–99)
Potassium: 3.3 meq/L — ABNORMAL LOW (ref 3.5–5.1)
Sodium: 139 meq/L (ref 135–145)

## 2023-09-04 MED ORDER — GLIPIZIDE ER 2.5 MG PO TB24: 2.5000 mg | ORAL_TABLET | Freq: Every day | ORAL | 3 refills | Status: DC

## 2023-09-04 MED ORDER — ACCU-CHEK SOFTCLIX LANCETS MISC: 3 refills | Status: AC

## 2023-09-04 MED ORDER — AMLODIPINE BESYLATE 5 MG PO TABS: 5.0000 mg | ORAL_TABLET | Freq: Every day | ORAL | 3 refills | Status: DC

## 2023-09-04 MED ORDER — ATORVASTATIN CALCIUM 10 MG PO TABS: 5.0000 mg | ORAL_TABLET | ORAL | 3 refills | Status: DC

## 2023-09-04 MED ORDER — CHLORTHALIDONE 25 MG PO TABS: 25.0000 mg | ORAL_TABLET | Freq: Every day | ORAL | 3 refills | Status: DC

## 2023-09-04 NOTE — Progress Notes (Unsigned)
Subjective:    Patient ID: [name], female    DOB: 07/06/1941, 82 y.o.   MRN: [id number]  HPI  Wt Readings from Last 3 Encounters:  09/04/23 165 lb 6 oz (75 kg)  08/26/23 165 lb 6 oz (75 kg)  06/12/23 182 lb 8 oz (82.8 kg)   25.90 kg/m  Vitals:   09/04/23 1129  BP: 132/66  Pulse: (!) 103  Temp: (!) 97.5 F (36.4 C)  SpO2: 96%    Pt presents for follow up of pna and uti / malaise/fatigue  Also knee pain  Also hypokalemia    Last visit pt presented with fatigue/malaise and cough   Cxr noted  DG Chest 2 View  Result Date: 08/26/2023 CLINICAL DATA:  Cough, weight loss EXAM: CHEST - 2 VIEW COMPARISON:  06/09/2016 FINDINGS: The heart size and mediastinal contours are within normal limits. Mild diffuse bilateral interstitial opacity. Disc degenerative disease of the thoracic spine. IMPRESSION: Mild diffuse bilateral interstitial opacity, consistent with edema or atypical/viral infection. No focal airspace opacity. Electronically Signed   By: Jearld Lesch M.D.   On: 08/26/2023 13:12    We treated for atypical pneumonia with doxycycline   Cough is better  Nausea is better  Breathing is ok   Still incredibly tired  Per husband -motivation is low    Joint pain is terrible  Both knees worse for a week /goes back and forth  No trauma  No new activity  Started last week    No heat  Sometimes knees look red /not today  Right knee is swollen -just started today   Has appointment with Rheumatology next week    Also urine culture came back positive/ klebsiella (resistant to amp) We treated with macrobid  No burning to urinate  No blood in urine  No nausea   Still not eating much /no appetite    Lab Results  Component Value Date   NA 140 08/26/2023   K 3.2 (L) 08/26/2023   CO2 29 08/26/2023   GLUCOSE 109 (H) 08/26/2023   BUN 8 08/26/2023   CREATININE 0.73 08/26/2023   CALCIUM 9.0 08/26/2023   GFR 77.19 08/26/2023   GFRNONAA >60 01/26/2017    Potassium was mildly low  We started klor con 10 meq daily  Lab Results  Component Value Date   WBC 6.6 08/26/2023   HGB 12.8 08/26/2023   HCT 40.1 08/26/2023   MCV 84.7 08/26/2023   PLT 463.0 (H) 08/26/2023   Lab Results  Component Value Date   TSH 1.44 08/26/2023       Patient Active Problem List   Diagnosis Date Noted   Dizziness 08/26/2023   Decreased appetite 08/26/2023   Abdominal tenderness 08/26/2023   Abdominal pain 08/26/2023   History of UTI 08/26/2023   Atypical pneumonia 08/26/2023   Nausea 05/27/2023   Tick bite of right forearm 02/06/2023   Breast pain, left 01/06/2023   Contusion of right great toe with damage to nail 01/06/2023   Heart murmur 08/27/2022   Mobility impaired 07/22/2022   Current use of proton pump inhibitor 10/01/2021   General weakness 10/01/2021   Poor balance 10/01/2021   On prednisone therapy 10/01/2021   Diabetes mellitus treated with oral medication (HCC) 11/13/2020   Hyperlipidemia associated with type 2 diabetes mellitus (HCC) 10/15/2020   Pre-op exam 06/01/2020   Abnormal urinalysis 04/03/2020   Fatigue 01/30/2020   TMJ (dislocation of temporomandibular joint) 04/29/2019   Encounter for screening mammogram for  breast cancer 10/27/2017   Routine general medical examination at a health care facility 10/27/2017   Aortic atherosclerosis (HCC) 02/03/2017   RA (rheumatoid arthritis) (HCC)    Rash 08/28/2016   Adjustment disorder with mixed anxiety and depressed mood 06/24/2016   Cough 06/09/2016   Essential hypertension 01/07/2016   Coronary atherosclerosis 11/07/2015   Osteoarthritis 05/10/2014   GERD 07/09/2007   Past Medical History:  Diagnosis Date   Allergic rhinitis    Cataract 2015   bilateral   COVID-19 10/2019   ASYMPTOMATIC   Diabetes mellitus without complication (HCC)    Diverticulosis    Esophageal reflux    Essential hypertension 01/07/2016   Fatty liver 2018   Fibula fracture 02/2005   Former  smoker    Gastritis 2003   Heart murmur    MILD, NO CARDIOLOGIST   Hemorrhoids    History of cardiac dysrhythmia 2011   ABLATION  AT [facility]   History of kidney stones YRS AGO   Hyperglycemia    Hyperlipemia    Osteoarthritis    Personal history of colonic polyps 1998   hyperplastic   PMB (postmenopausal bleeding)    Pre-diabetes    BORDERLINE DIET CONTROLLED DOES NOT CHECK CBG   RA (rheumatoid arthritis) (HCC)    Rectal bleed 2018   TRANSFUSION GIVEN   Shingles 2012   Uterine fibroid    Past Surgical History:  Procedure Laterality Date   CHOLECYSTECTOMY  YRS AGO   COLONOSCOPY  11/08   internal hemorrhoids   DILATATION & CURETTAGE/HYSTEROSCOPY WITH MYOSURE N/A 07/06/2020   Procedure: DILATATION & CURETTAGE/HYSTEROSCOPY;  Surgeon: Theresia Majors, MD;  Location: [facility];  Service: Gynecology;  Laterality: N/A;  request 7:30am OR time in [place] [facility] block requests 30 minutes   EP procedure  1994   SVT; normal echo   ESOPHAGOGASTRODUODENOSCOPY  11/03   reactive gastropathy   EYE SURGERY Bilateral 2017   CATARACTS   LIPOMA EXCISION  11/2002   SVT s/p surgery--? ablation  2011   AT [facility]   TUBAL LIGATION  YRS AGO   vaginal polyp excised  12/2000   benign   Social History   Tobacco Use   Smoking status: Former    Current packs/day: 0.00    Average packs/day: 0.8 packs/day for 15.0 years (11.3 ttl pk-yrs)    Types: Cigarettes    Start date: 10/20/1988    Quit date: 10/21/2003    Years since quitting: 19.8   Smokeless tobacco: Never  Vaping Use   Vaping status: Never Used  Substance Use Topics   Alcohol use: No    Alcohol/week: 0.0 standard drinks of alcohol   Drug use: No   Family History  Problem Relation Age of Onset   Colon cancer Father 46   Diabetes Mother    Heart disease Mother    Kidney disease Mother        ESRD   Stroke Mother    Hypertension Brother    Breast cancer Sister 30   Diabetes Other        nephews   Colon cancer  Sister    Esophageal cancer Neg Hx    Stomach cancer Neg Hx    Rectal cancer Neg Hx    Allergies  Allergen Reactions   Bee Venom Anaphylaxis   Dicyclomine     Blurry vision   Humira [Adalimumab]     LIPS SWELLED AND RASH   Metformin And Related     rash  Current Outpatient Medications on File Prior to Visit  Medication Sig Dispense Refill   Accu-Chek Softclix Lancets lancets Check glucose level daily and as needed for diabetes type 2  (lancets for the soft clicks lancing device AccuChek meter) 100 each 3   acetaminophen (TYLENOL) 325 MG tablet Take 650 mg by mouth every 6 (six) hours as needed.     albuterol (PROVENTIL HFA;VENTOLIN HFA) 108 (90 Base) MCG/ACT inhaler Inhale 2 puffs into the lungs every 4 (four) hours as needed for wheezing or shortness of breath. Please give spacer if possible 1 Inhaler 3   amLODipine (NORVASC) 5 MG tablet TAKE 1 TABLET BY MOUTH EVERY DAY AT NOON 90 tablet 0   atorvastatin (LIPITOR) 10 MG tablet TAKE 1/2 TABLET BY MOUTH EVERY OTHER DAY 22 tablet 2   blood glucose meter kit and supplies To check glucose daily and prn for DM2 E11.9 1 each 0   chlorthalidone (HYGROTON) 25 MG tablet Take 1 tablet (25 mg total) by mouth daily. 90 tablet 1   dicyclomine (BENTYL) 10 MG/5ML solution Take 1-2 mLs (2-4 mg total) by mouth 4 (four) times daily -  before meals and at bedtime. Prev with blurry vision on higher dose but not an allergy and may be able to tolerate lower dose. 100 mL 3   docusate sodium (COLACE) 100 MG capsule Take 1 capsule (100 mg total) by mouth 2 (two) times daily. 60 capsule 0   doxycycline (VIBRA-TABS) 100 MG tablet Take 1 tablet (100 mg total) by mouth 2 (two) times daily. 14 tablet 0   EPINEPHrine (EPIPEN IJ) Inject as directed as needed. Reported on 12/05/2015     fluticasone (FLONASE) 50 MCG/ACT nasal spray PLACE 2 SPRAYS INTO THE NOSE DAILY. 48 g 3   folic acid (FOLVITE) 1 MG tablet Take 1 mg by mouth daily.     glipiZIDE (GLUCOTROL XL) 2.5 MG  24 hr tablet TAKE 1 TABLET BY MOUTH EVERY DAY WITH BREAKFAST 90 tablet 0   glucose blood (ACCU-CHEK GUIDE) test strip USE TO CHECK BLOOD SUGAR DAILY 100 strip 1   hydroxychloroquine (PLAQUENIL) 200 MG tablet Take 200 mg by mouth 2 (two) times daily.     loratadine (CLARITIN) 10 MG tablet Take 10 mg by mouth daily. MAY TAKE AT HS ALSO     methotrexate (RHEUMATREX) 2.5 MG tablet Take 15 mg by mouth once a week. Caution:Chemotherapy. Protect from light. ( Tuesday of each week )     nitrofurantoin, macrocrystal-monohydrate, (MACROBID) 100 MG capsule Take 1 capsule (100 mg total) by mouth 2 (two) times daily. 10 capsule 0   ondansetron (ZOFRAN) 4 MG tablet Take 1 tablet (4 mg total) by mouth every 8 (eight) hours as needed for nausea or vomiting. Caution of sedation 20 tablet 0   pantoprazole (PROTONIX) 40 MG tablet TAKE 1 TABLET BY MOUTH EVERY DAY AT NOON 90 tablet 1   polyethylene glycol (MIRALAX / GLYCOLAX) packet Take 17 g by mouth daily.     potassium chloride (KLOR-CON M) 10 MEQ tablet Take 1 tablet (10 mEq total) by mouth daily. 30 tablet 0   predniSONE (DELTASONE) 5 MG tablet Take 5 mg by mouth daily.     triamcinolone cream (KENALOG) 0.1 % Apply 1 Application topically 2 (two) times daily. On rash/affected area 15 g 0   No current facility-administered medications on file prior to visit.    Review of Systems     Objective:   Physical Exam  Assessment & Plan:   Problem List Items Addressed This Visit       Cardiovascular and Mediastinum   Essential hypertension - Primary     Digestive   GERD     Endocrine   Hyperlipidemia associated with type 2 diabetes mellitus (HCC)   Other Visit Diagnoses     Controlled type 2 diabetes mellitus without complication, without long-term current use of insulin (HCC)

## 2023-09-04 NOTE — Patient Instructions (Addendum)
When you are done with both antibiotics , I want to repeat your urine culture  Please bring a sample   Labs today for potassium   See the rheumatologist and discuss your arthritis I do wonder if a pain clinic would help in the future-ask about that  Also ask how much /what exercise you could try safely   I will put a community care referral in - you will get a call / to see what services would be helpful   In the future I think a discussion about depression would be a good idea  Come back when you are ready to talk about that

## 2023-09-05 DIAGNOSIS — R4589 Other symptoms and signs involving emotional state: Secondary | ICD-10-CM | POA: Insufficient documentation

## 2023-09-05 NOTE — Assessment & Plan Note (Signed)
Blood pressure is improved BP: 132/66   Urged to continue the chlorthalidone at 25 mg daily  Amlodipine 5 mg daily  Tolerating medications   Check at home when able  Labs today

## 2023-09-05 NOTE — Assessment & Plan Note (Signed)
K of 3.2  On diuretic  Now taking klor con 10 meq daily  Bmet today

## 2023-09-05 NOTE — Assessment & Plan Note (Signed)
Per pt pain is unbearable at times and this creates mobility impairment and depression  Today right knee is more swollen than usual but not erythematous or hot  Taking prednisone and methotrexate and folic acid and plaquenil  Has rheum appointment next week   In wheelchair today  Encouraged to consider discussing option of a pain clinic with her rheumatologist   Will consult community care to see if she can get any help with mobility impairment and depressed mood

## 2023-09-05 NOTE — Assessment & Plan Note (Signed)
Due to chronic pain/ frustration Has decreased motivation which causes friction with family   She would like to get pain issues addressed first with rheumatology before returning to discuss depression  Unsure if she would be open to treatment

## 2023-09-05 NOTE — Assessment & Plan Note (Signed)
Taking macrobid  Will return sample for repeat culture when done with antibiotic   Encouraged good fluid intake

## 2023-09-05 NOTE — Assessment & Plan Note (Signed)
Clinical improvement with doxycycline  Reassuring exam  Plan to finish doxycycline and monitor closely   Pulse ox 96% and improved cough

## 2023-09-05 NOTE — Assessment & Plan Note (Signed)
Multi factorial in setting of  Atypical pneumonia (improving) Klebsiella uti (improving) Severe chronic pain from RA  Depressed mood  Reviewed recent labs   Will finish all antibiotic and fu with rheumatology

## 2023-09-05 NOTE — Assessment & Plan Note (Signed)
Lab Results  Component Value Date   HGBA1C 6.8 (H) 05/12/2023   Continues glipizice xl 2.5 mg  Encouraged to watch for hypoglycemia in setting of low appetite and decreased oral intake  Taking chronic prednisone from rheumatology for RA

## 2023-09-07 ENCOUNTER — Telehealth: Payer: Self-pay | Admitting: *Deleted

## 2023-09-07 ENCOUNTER — Other Ambulatory Visit: Payer: Self-pay

## 2023-09-07 DIAGNOSIS — E876 Hypokalemia: Secondary | ICD-10-CM

## 2023-09-07 MED ORDER — POTASSIUM CHLORIDE CRYS ER 10 MEQ PO TBCR: 10.0000 meq | EXTENDED_RELEASE_TABLET | Freq: Two times a day (BID) | ORAL | 1 refills | Status: DC

## 2023-09-07 NOTE — Progress Notes (Signed)
  Care Coordination   Note   09/07/2023 Name: [name] MRN: [id number] DOB: [birth year]  [name] is a 82 y.o. year old female who sees Tower, Audrie Gallus, MD for primary care. I reached out to [name] by phone today to offer care coordination services.  Ms. Schuldt was given information about Care Coordination services today including:   The Care Coordination services include support from the care team which includes your Nurse Coordinator, Clinical Social Worker, or Pharmacist.  The Care Coordination team is here to help remove barriers to the health concerns and goals most important to you. Care Coordination services are voluntary, and the patient may decline or stop services at any time by request to their care team member.   Care Coordination Consent Status: Patient agreed to services and verbal consent obtained.   Follow up plan:  Telephone appointment with care coordination team member scheduled for:  09/11/2023 and 09/16/2023  Encounter Outcome:  Patient Scheduled from referral   Burman Nieves, [facility] Care Coordination Care Guide Direct Dial: [phone]

## 2023-09-09 DIAGNOSIS — M25562 Pain in left knee: Secondary | ICD-10-CM | POA: Diagnosis not present

## 2023-09-09 DIAGNOSIS — M1991 Primary osteoarthritis, unspecified site: Secondary | ICD-10-CM | POA: Diagnosis not present

## 2023-09-09 DIAGNOSIS — M5136 Other intervertebral disc degeneration, lumbar region with discogenic back pain only: Secondary | ICD-10-CM | POA: Diagnosis not present

## 2023-09-09 DIAGNOSIS — M0579 Rheumatoid arthritis with rheumatoid factor of multiple sites without organ or systems involvement: Secondary | ICD-10-CM | POA: Diagnosis not present

## 2023-09-09 DIAGNOSIS — M25561 Pain in right knee: Secondary | ICD-10-CM | POA: Diagnosis not present

## 2023-09-09 DIAGNOSIS — R5383 Other fatigue: Secondary | ICD-10-CM | POA: Diagnosis not present

## 2023-09-11 ENCOUNTER — Ambulatory Visit: Payer: Self-pay | Admitting: *Deleted

## 2023-09-11 NOTE — Patient Outreach (Signed)
  Care Coordination   Follow Up Visit Note   09/11/2023 Name: [name] MRN: [id number] DOB: 1941/05/20  [name] is a 82 y.o. year old female who sees Tower, Audrie Gallus, MD for primary care. I spoke with  [name] by phone today.  What matters to the patients health and wellness today?  Patient discussed difficulty with managing arthritis pain, affects her mobility. Dicussed difficulty managing her household duties.  Family assists, patient would like consistent support, considering hiring a housekeeper for additional support.   Goals Addressed             This Visit's Progress    care coordination activities-in home support       Activities and task to complete in order to accomplish goals.   TASK TO ACCOMPLISH GOAL Please consider the  option for housekeeper to assist with your household needs Keep all upcoming appointment discussed today Self Support options  (continue to reach out to family and church friends for support) CSW to continue to assess for community resource needs.          SDOH assessments and interventions completed:  Yes  SDOH Interventions Today    Flowsheet Row Most Recent Value  SDOH Interventions   Food Insecurity Interventions Intervention Not Indicated  Housing Interventions Intervention Not Indicated  Transportation Interventions Intervention Not Indicated  Utilities Interventions Intervention Not Indicated        Care Coordination Interventions:  Yes, provided  Interventions Today    Flowsheet Row Most Recent Value  Chronic Disease   Chronic disease during today's visit Other  [chronic fatigue, depression, arthritis]  General Interventions   General Interventions Discussed/Reviewed General Interventions Discussed, Level of Care  [Needs ass. completed-reports having difficulty cleaning, washing and cooking due to leg pain- recent cortisone shots have helped- 2 daughters that live local comes by to assist but not  everyday-"they do what they can" would like consistent help]  Level of Care Personal Care Services  [difficulty standing, cooking, dressing spouse helps-considering allowing a housekeeper to come in to assist with household duties]  Exercise Interventions   Exercise Discussed/Reviewed Exercise Discussed  ["I move my legs, walking down the hall or in the den" open to [facility] PT spouse will walk behind her to keep her from falling]  Mental Health Interventions   Mental Health Discussed/Reviewed Mental Health Discussed, Depression, Coping Strategies  [confirms that change in her ability to cook, clean etc very difficult, family supportive, strong spiritual life helps, perspective and thoughts of gratitude also benefiical , declined need for ongoing mental health support]  Nutrition Interventions   Nutrition Discussed/Reviewed Nutrition Discussed  [spouse cooks, or they go out to eat]  Safety Interventions   Safety Discussed/Reviewed Safety Discussed  [encouragd use of assistive devices while ambulating]       Follow up plan: Follow up call scheduled for 09/25/23    Encounter Outcome:  Patient Visit Completed

## 2023-09-11 NOTE — Patient Instructions (Signed)
Visit Information  Thank you for taking time to visit with me today. Please don't hesitate to contact me if I can be of assistance to you.   Following are the goals we discussed today:   Goals Addressed             This Visit's Progress    care coordination activities-in home support       Activities and task to complete in order to accomplish goals.   TASK TO ACCOMPLISH GOAL Please consider the  option for housekeeper to assist with your household needs Keep all upcoming appointment discussed today Self Support options  (continue to reach out to family and church friends for support) CSW to continue to assess for community resource needs.          Our next appointment is by telephone on 09/25/23 at 2pm  Please call the care guide team at [phone] if you need to cancel or reschedule your appointment.   If you are experiencing a Mental Health or Behavioral Health Crisis or need someone to talk to, please call 911   Patient verbalizes understanding of instructions and care plan provided today and agrees to view in MyChart. Active MyChart status and patient understanding of how to access instructions and care plan via MyChart confirmed with patient.     Telephone follow up appointment with care management team member scheduled for: 09/25/23  Verna Czech, LCSW   [facility], [facility] Licensed Clinical Social Worker Care Coordinator  Direct Dial: [phone]

## 2023-09-14 ENCOUNTER — Other Ambulatory Visit: Payer: Medicare Other

## 2023-09-16 ENCOUNTER — Ambulatory Visit: Payer: Self-pay

## 2023-09-16 NOTE — Patient Outreach (Signed)
  Care Coordination   09/16/2023 Name: [name] MRN: [id number] DOB: 1941/05/25   Care Coordination Outreach Attempts:  An unsuccessful telephone outreach was attempted for a scheduled appointment today. Unable to reach patient or leave message due to mailbox being full.   Follow Up Plan:  Additional outreach attempts will be made to offer the patient care coordination information and services.   Encounter Outcome:  No Answer   Care Coordination Interventions:  No, not indicated    George Ina RN,BSN,CCM [facility]  [facility], [facility] coordinator / Case Manager Phone: [phone]

## 2023-09-21 ENCOUNTER — Other Ambulatory Visit: Payer: Self-pay | Admitting: Family Medicine

## 2023-09-21 DIAGNOSIS — E876 Hypokalemia: Secondary | ICD-10-CM

## 2023-09-21 NOTE — Telephone Encounter (Signed)
I refilled it  She also needs labs for bmet-see last result note  Thanks

## 2023-09-22 NOTE — Telephone Encounter (Signed)
Pt no-showed her lab appt, please r/s non fasting lab

## 2023-09-23 NOTE — Telephone Encounter (Signed)
Patient has been scheduled

## 2023-09-24 ENCOUNTER — Other Ambulatory Visit: Payer: Self-pay | Admitting: Radiology

## 2023-09-24 DIAGNOSIS — N390 Urinary tract infection, site not specified: Secondary | ICD-10-CM | POA: Diagnosis not present

## 2023-09-24 DIAGNOSIS — B9689 Other specified bacterial agents as the cause of diseases classified elsewhere: Secondary | ICD-10-CM | POA: Diagnosis not present

## 2023-09-25 ENCOUNTER — Ambulatory Visit: Payer: Self-pay | Admitting: *Deleted

## 2023-09-25 NOTE — Patient Outreach (Signed)
  Care Coordination   Follow Up Visit Note   09/25/2023 Name: [name] MRN: [id number] DOB: [birth year]  [name] is a 82 y.o. year old female who sees Tower, Audrie Gallus, MD for primary care. I spoke with  [name] by phone today.  What matters to the patients health and wellness today?  Patient continues to discuss difficulty with managing arthritis pain, affecting  her mobility. Dicussed difficulty managing her household duties.  Family continues to assist, considering hiring a private duet care aid encouraged as a consideration.      Goals Addressed             This Visit's Progress    care coordination activities-in home support       Activities and task to complete in order to accomplish goals.   TASK TO ACCOMPLISH GOAL Please to continue to consider the  option for housekeeper to assist with your household needs Keep all upcoming appointment discussed today Self Support options  (continue to reach out to family and church friends for support) CSW to continue to assess for community resource needs.          SDOH assessments and interventions completed:  No     Care Coordination Interventions:  Yes, provided  Interventions Today    Flowsheet Row Most Recent Value  Chronic Disease   Chronic disease during today's visit Other  [chronic fatigue, depression, arthritis]  General Interventions   General Interventions Discussed/Reviewed General Interventions Reviewed  [continued difficulty completing household duties due to arthritis pain-continued neeed  for assistance with cleaning, cookind due to difficulty standing for long periods of time-willing to consider PT]  Level of Care Personal Care Services  [confirmed that patient, s spouse is helpful as well as patient's son with household duties.]  Mental Health Interventions   Mental Health Discussed/Reviewed Mental Health Reviewed, Coping Strategies, Depression  [emotional support continues to be  provided, pt confirms ability to reach out to family and friends for emotional support-pt  confirms having a strong spirtual which helps to manage her depression-pos reinforcement provided for coping strategies utilized]  Nutrition Interventions   Nutrition Discussed/Reviewed Nutrition Reviewed  [spouse assists with meals as well as son who will provide meals to last throughout the week.]  Safety Interventions   Safety Discussed/Reviewed Safety Reviewed  [continued use of assistive devices while ambulating encouraged]       Follow up plan: Follow up call scheduled for 10/16/23    Encounter Outcome:  Patient Visit Completed

## 2023-09-25 NOTE — Patient Instructions (Signed)
Visit Information  Thank you for taking time to visit with me today. Please don't hesitate to contact me if I can be of assistance to you.   Following are the goals we discussed today:   Goals Addressed             This Visit's Progress    care coordination activities-in home support       Activities and task to complete in order to accomplish goals.   TASK TO ACCOMPLISH GOAL Please to continue to consider the  option for housekeeper to assist with your household needs Keep all upcoming appointment discussed today Self Support options  (continue to reach out to family and church friends for support) CSW to continue to assess for community resource needs.          Our next appointment is by telephone on 10/16/23 at 11am  Please call the care guide team at [phone] if you need to cancel or reschedule your appointment.   If you are experiencing a Mental Health or Behavioral Health Crisis or need someone to talk to, please call 911   Patient verbalizes understanding of instructions and care plan provided today and agrees to view in MyChart. Active MyChart status and patient understanding of how to access instructions and care plan via MyChart confirmed with patient.     Telephone follow up appointment with care management team member scheduled for: 10/16/23  Verna Czech, LCSW [place]  [facility], [facility] Licensed Clinical Social Worker Care Coordinator  Direct Dial: [phone]

## 2023-09-27 LAB — URINE CULTURE
MICRO NUMBER:: 15813689
SPECIMEN QUALITY:: ADEQUATE

## 2023-09-28 ENCOUNTER — Telehealth: Payer: Self-pay | Admitting: Family Medicine

## 2023-09-28 DIAGNOSIS — E876 Hypokalemia: Secondary | ICD-10-CM

## 2023-09-28 MED ORDER — CEPHALEXIN 500 MG PO CAPS
500.0000 mg | ORAL_CAPSULE | Freq: Two times a day (BID) | ORAL | 0 refills | Status: DC
Start: 2023-09-28 — End: 2023-12-02

## 2023-09-28 NOTE — Telephone Encounter (Signed)
Your uti is not gone yet Looks like there is some resistance to the macrobid we gave you this time  I want to treat with keflex  Sent it to your pharmacy   Please let us know if no improvement in how you feel

## 2023-09-28 NOTE — Telephone Encounter (Signed)
-----   Message from Lovena Neighbours sent at 09/23/2023  2:08 PM EST ----- Regarding: Labs for Tuesday 12.10.24 Please put lab orders in future. Thank you, Denny Peon

## 2023-09-28 NOTE — Telephone Encounter (Signed)
Pt notified of results and Dr. Royden Purl comments, pt will stop by pharmacy and get med and keep [place] posted

## 2023-09-29 ENCOUNTER — Other Ambulatory Visit (INDEPENDENT_AMBULATORY_CARE_PROVIDER_SITE_OTHER): Payer: Medicare Other

## 2023-09-29 DIAGNOSIS — E876 Hypokalemia: Secondary | ICD-10-CM | POA: Diagnosis not present

## 2023-09-29 LAB — BASIC METABOLIC PANEL
BUN: 8 mg/dL (ref 6–23)
CO2: 26 meq/L (ref 19–32)
Calcium: 9.1 mg/dL (ref 8.4–10.5)
Chloride: 105 meq/L (ref 96–112)
Creatinine, Ser: 0.78 mg/dL (ref 0.40–1.20)
GFR: 71.24 mL/min (ref >60.00)
Glucose, Bld: 137 mg/dL — ABNORMAL HIGH (ref 70–99)
Potassium: 3.6 meq/L (ref 3.5–5.1)
Sodium: 142 meq/L (ref 135–145)

## 2023-10-12 DIAGNOSIS — M25562 Pain in left knee: Secondary | ICD-10-CM | POA: Diagnosis not present

## 2023-10-12 DIAGNOSIS — M25561 Pain in right knee: Secondary | ICD-10-CM | POA: Diagnosis not present

## 2023-10-12 DIAGNOSIS — M0579 Rheumatoid arthritis with rheumatoid factor of multiple sites without organ or systems involvement: Secondary | ICD-10-CM | POA: Diagnosis not present

## 2023-10-12 DIAGNOSIS — M1991 Primary osteoarthritis, unspecified site: Secondary | ICD-10-CM | POA: Diagnosis not present

## 2023-10-12 DIAGNOSIS — M5136 Other intervertebral disc degeneration, lumbar region with discogenic back pain only: Secondary | ICD-10-CM | POA: Diagnosis not present

## 2023-10-16 ENCOUNTER — Ambulatory Visit: Payer: Self-pay | Admitting: *Deleted

## 2023-10-16 NOTE — Patient Instructions (Signed)
Visit Information  Thank you for taking time to visit with me today. Please don't hesitate to contact me if I can be of assistance to you.   Following are the goals we discussed today:   Goals Addressed             This Visit's Progress    care coordination activities-in home support       Activities and task to complete in order to accomplish goals.   TASK TO ACCOMPLISH GOAL Please  continue to follow up with option for regularly scheduled housekeeping to assist with your household needs Keep all upcoming appointment discussed today-rheumatology to manage arthritis pain Self Support options  (continue to reach out to family and church friends for support) CSW to collaborate with [facility] regarding additional in home support needs          Our next appointment is by telephone on 11/04/23 at 1pm  Please call the care guide team at [phone] if you need to cancel or reschedule your appointment.   If you are experiencing a Mental Health or Behavioral Health Crisis or need someone to talk to, please call 911   Patient verbalizes understanding of instructions and care plan provided today and agrees to view in MyChart. Active MyChart status and patient understanding of how to access instructions and care plan via MyChart confirmed with patient.     Telephone follow up appointment with care management team member scheduled for:  11/04/23  Verna Czech, LCSW [place]  [facility], [facility] Licensed Clinical Social Worker Care Coordinator  Direct Dial: [phone]

## 2023-10-16 NOTE — Patient Outreach (Signed)
  Care Coordination   Follow Up Visit Note   10/16/2023 Name: [name] MRN: [id number] DOB: 1941/05/29  [name] is a 82 y.o. year old female who sees Tower, Audrie Gallus, MD for primary care. I spoke with  [name] by phone today.  What matters to the patients health and wellness today?    Management of arthritis pain and in home support resources   Goals Addressed             This Visit's Progress    care coordination activities-in home support       Activities and task to complete in order to accomplish goals.   TASK TO ACCOMPLISH GOAL Please  continue to follow up with option for regularly scheduled housekeeping to assist with your household needs Keep all upcoming appointment discussed today-rheumatology to manage arthritis pain Self Support options  (continue to reach out to family and church friends for support) CSW to collaborate with RNCM regarding additional in home support needs          SDOH assessments and interventions completed:  No     Care Coordination Interventions:  Yes, provided  Interventions Today    Flowsheet Row Most Recent Value  Chronic Disease   Chronic disease during today's visit Other  [chronic fatigue, depression, arthritis]  General Interventions   General Interventions Discussed/Reviewed General Interventions Discussed, Doctor Visits  [Need assessment completed, patient contiues to report challenges with daily arthirits pain, willing to consider PT follow up if indicated]  Doctor Visits Discussed/Reviewed Doctor Visits Discussed  [Seen by Rheumetology 10/06/23-per patient, given a cortisone shot, considering daily shot]  Level of Care Personal Care Services  [confirmed that patient's son and daughters continue to  assist with housekeeping, pt now has the name of an agency that will come regularly to assist]  Exercise Interventions   Exercise Discussed/Reviewed Exercise Discussed  [Pt confirms daily stretches of her   legs daily in bed, walks from bedroom to den daily]  Mental Health Interventions   Mental Health Discussed/Reviewed Mental Health Reviewed, Coping Strategies, Depression  Safety Interventions   Safety Discussed/Reviewed Safety Discussed  [CSW continues to encourage patient to use walker while ambulating in the house, wheelchair when outside of the home to avoid falls]         Follow up plan: Follow up call scheduled for 11/04/23    Encounter Outcome:  Patient Visit Completed

## 2023-10-19 ENCOUNTER — Ambulatory Visit: Payer: Self-pay

## 2023-10-19 NOTE — Patient Outreach (Unsigned)
  Care Coordination   Initial Visit Note   10/19/2023 Name: [name] MRN: [id number] DOB: 22-Sep-1941  [name] is a 82 y.o. year old female who sees Tower, Audrie Gallus, MD for primary care. I spoke with  [name] by phone today.  What matters to the patients health and wellness today?  ***    Goals Addressed             This Visit's Progress    Management and education of chronic health conditions.       Interventions Today    Flowsheet Row Most Recent Value  Chronic Disease   Chronic disease during today's visit Hypertension (HTN), Other  [status post pneumonia, falls]  General Interventions   General Interventions Discussed/Reviewed General Interventions Reviewed, Doctor Visits  [evaluation of current treatment plan for mentioned health conditions and patients adherence to plan as established by provider.  Assessed for BP readings, ongoing pneumonia symptoms,]  Doctor Visits Discussed/Reviewed Doctor Visits Reviewed  Annabell Sabal upcoming provider visit. Advised to keep follow up appointments with providers.]  Education Interventions   Education Provided Provided Education  [Advised to monitor blood pressures and record.  Advised to notify provider for blood pressure readings outside of established parameters or any new / ongoing symptoms.]  Pharmacy Interventions   Pharmacy Dicussed/Reviewed Pharmacy Topics Reviewed  [reviewed medication list. Complained discussed and advised.]  Safety Interventions   Safety Discussed/Reviewed Fall Risk, Safety Discussed  [assessed for falls. Discussed fall prevention and education article mailed to patient.]               SDOH assessments and interventions completed:  {yes/no:20286}{THN Tip this will not be part of the note when signed-REQUIRED REPORT FIELD DO NOT DELETE (Optional):27901}  SDOH Interventions Today    Flowsheet Row Most Recent Value  SDOH Interventions   Food Insecurity Interventions Intervention  Not Indicated  Housing Interventions Intervention Not Indicated  Transportation Interventions Intervention Not Indicated  Utilities Interventions Intervention Not Indicated        Care Coordination Interventions:  {INTERVENTIONS:27767} {THN Tip this will not be part of the note when signed-REQUIRED REPORT FIELD DO NOT DELETE (Optional):27901}  Follow up plan: {CCFOLLOWUP:27768}   Encounter Outcome:  {ENCOUTCOME:27770} {THN Tip this will not be part of the note when signed-REQUIRED REPORT FIELD DO NOT DELETE (Optional):27901}

## 2023-11-04 ENCOUNTER — Ambulatory Visit: Payer: Self-pay | Admitting: *Deleted

## 2023-11-04 NOTE — Patient Instructions (Signed)
 Visit Information  Thank you for taking time to visit with me today. Please don't hesitate to contact me if I can be of assistance to you.   Following are the goals we discussed today:   Goals Addressed             This Visit's Progress    COMPLETED: care coordination activities-in home support       Activities and task to complete in order to accomplish goals.   TASK TO ACCOMPLISH GOAL  Continue to follow up with option for regularly scheduled housekeeping to assist with your household needs Keep all upcoming appointment discussed today-rheumatology to manage arthritis pain Self Support options  (continue to reach out to family and church friends for in home support)           If you are experiencing a Mental Health or Behavioral Health Crisis or need someone to talk to, please call 911   Patient verbalizes understanding of instructions and care plan provided today and agrees to view in MyChart. Active MyChart status and patient understanding of how to access instructions and care plan via MyChart confirmed with patient.     No further follow up required: patient to contact this Child psychotherapist with any additional community resource needs  [employer], [employer] [place]  [facility], [facility] Licensed Clinical Social Geologist, engineering Dial: [phone]

## 2023-11-04 NOTE — Patient Outreach (Signed)
  Care Coordination   Follow Up Visit Note   11/04/2023 Name: [name] MRN: [id number] DOB: 03-02-1941  [name] is a 83 y.o. year old female who sees Tower, Manley Seeds, MD for primary care. I spoke with  [name] by phone today.  What matters to the patients health and wellness today?  Patient assessed for ongoing community resource needs. Patient confirms strong support from family to assist with household needs.  Also has resources for private duty housekeeping. Patient confirmed having no additional mental health/community resource needs at this time.   Goals Addressed             This Visit's Progress    care coordination activities-in home support       Activities and task to complete in order to accomplish goals.   TASK TO ACCOMPLISH GOAL  Continue to follow up with option for regularly scheduled housekeeping to assist with your household needs Keep all upcoming appointment discussed today-rheumatology to manage arthritis pain Self Support options  (continue to reach out to family and church friends for in home support)           SDOH assessments and interventions completed:  No     Care Coordination Interventions:  Yes, provided  Interventions Today    Flowsheet Row Most Recent Value  Chronic Disease   Chronic disease during today's visit Hypertension (HTN)  [Rheumatoid Arthritis , falls]  General Interventions   General Interventions Discussed/Reviewed General Interventions Reviewed, Level of Care  [Assessed for mental health/community resource needs-confirmed strong support from family, also has paid options-no addtional needs verbalized at this time]  Level of Care Personal Care Services  [confirmed that patient's son and daughters assist with housekeeping/ spouse assist with grocery shopping, also has resources for paid in home assistance]  Mental Health Interventions   Mental Health Discussed/Reviewed Mental Health Reviewed, Coping  Strategies, Depression  [confirmed some depression related to her medical condition, uses prayer and spouse for support-denies having any ongoing MH needs]  Safety Interventions   Safety Discussed/Reviewed Safety Reviewed, Fall Risk  [confirmed that patient uses cain when ambulating to avoid falls]       Follow up plan: No further intervention required.   Encounter Outcome:  Patient Visit Completed

## 2023-11-11 ENCOUNTER — Ambulatory Visit: Payer: Self-pay

## 2023-11-11 NOTE — Patient Outreach (Signed)
  Care Coordination   Follow Up Visit Note   11/11/2023 Name: [name] MRN: [id number] DOB: Apr 19, 1941  [name] is a 83 y.o. year old female who sees Tower, Audrie Gallus, MD for primary care. I spoke with  [name] by phone today.  What matters to the patients health and wellness today?  Patient states she is feeling better. She reports fatigue and SOB has resolved. Patient reports having injections in bilateral knees 09/2023. She reports having ongoing knee pain with pain level being 7.  Patient states she is scheduled to see the rheumatologist 12/2023. Patient denies recent falls. She reports using cane/ walker for ambulation.  Patient states she is losing weight due to lack of appetite. She reports weight prior to getting pneumonia in 08/2023 was 190 lbs and states current weight is 165 lbs. Patient states she has informed her primary care provider of this.  Patient states she drinks nutritional drinks when her son in law  is able to purchase them for her.    Goals Addressed             This Visit's Progress    Management and education of chronic health conditions.       Interventions Today    Flowsheet Row Most Recent Value  Chronic Disease   Chronic disease during today's visit Hypertension (HTN), Other  [rheumatoid arthritis, falls, status post pneumonia]  General Interventions   General Interventions Discussed/Reviewed General Interventions Reviewed, Doctor Visits  [evaluation of current treatment plan for listed health conditions and patients adherence to plan as established by provider. Asssessed for BP readings, resolution of pneumonia symptoms, pain level.]  Doctor Visits Discussed/Reviewed Doctor Visits Reviewed  [reminded patient of upcoming AWV scheduled for 11/17/23. Reviewed remainder upcoming provider visits. Advised to keep follow up visits as recommended. confirmed patient has scheduled appointment with rheumatologist.]  Education Interventions    Education Provided Provided Education  [Advised to continue monitoring BP and record. Advised to take BP readings to next provider appointment.]  Provided Verbal Education On Other  [Advised to notify provider for any new or ongoing symptoms.]  Nutrition Interventions   Nutrition Discussed/Reviewed Nutrition Reviewed, Supplemental nutrition, Increasing proteins  [Advised to restart drinking nutritional supplement daily due to loss in appetite and weight. Discussed importance of eating more protein to combat muscle waisting. Advised to let provider know of lack of appetite and weight loss. Advised to stay hydrated]  Pharmacy Interventions   Pharmacy Dicussed/Reviewed Pharmacy Topics Reviewed  [medications reviewed and compliance discussed.]  Safety Interventions   Safety Discussed/Reviewed Fall Risk  [Fall prevention discussed. Advised to use ambulatory devices as recommended.]                 SDOH assessments and interventions completed:  No     Care Coordination Interventions:  Yes, provided   Follow up plan: Follow up call scheduled for 12/17/23 at 2 pm    Encounter Outcome:  Patient Visit Completed   George Ina RN, BSN, CCM [place]  [facility], Population Health Case Manager Phone: [phone]

## 2023-11-11 NOTE — Patient Instructions (Signed)
Visit Information  Thank you for taking time to visit with me today. Please don't hesitate to contact me if I can be of assistance to you.   Following are the goals we discussed today:   Goals Addressed             This Visit's Progress    Management and education of chronic health conditions.       Interventions Today    Flowsheet Row Most Recent Value  Chronic Disease   Chronic disease during today's visit Hypertension (HTN), Other  [rheumatoid arthritis, falls, status post pneumonia]  General Interventions   General Interventions Discussed/Reviewed General Interventions Reviewed, Doctor Visits  [evaluation of current treatment plan for listed health conditions and patients adherence to plan as established by provider. Asssessed for BP readings, resolution of pneumonia symptoms, pain level.]  Doctor Visits Discussed/Reviewed Doctor Visits Reviewed  [reminded patient of upcoming AWV scheduled for 11/17/23. Reviewed remainder upcoming provider visits. Advised to keep follow up visits as recommended. confirmed patient has scheduled appointment with rheumatologist.]  Education Interventions   Education Provided Provided Education  [Advised to continue monitoring BP and record. Advised to take BP readings to next provider appointment.]  Provided Verbal Education On Other  [Advised to notify provider for any new or ongoing symptoms.]  Nutrition Interventions   Nutrition Discussed/Reviewed Nutrition Reviewed, Supplemental nutrition, Increasing proteins  [Advised to restart drinking nutritional supplement daily due to loss in appetite and weight. Discussed importance of eating more protein to combat muscle waisting. Advised to let provider know of lack of appetite and weight loss. Advised to stay hydrated]  Pharmacy Interventions   Pharmacy Dicussed/Reviewed Pharmacy Topics Reviewed  [medications reviewed and compliance discussed.]  Safety Interventions   Safety Discussed/Reviewed Fall Risk   [Fall prevention discussed. Advised to use ambulatory devices as recommended.]                 Our next appointment is by telephone on 12/17/23 at 2 pm  Please call the care guide team at [phone] if you need to cancel or reschedule your appointment.   If you are experiencing a Mental Health or Behavioral Health Crisis or need someone to talk to, please call the Suicide and Crisis Lifeline: 988 call 1-800-273-TALK (toll free, 24 hour hotline)  Patient verbalizes understanding of instructions and care plan provided today and agrees to view in MyChart. Active MyChart status and patient understanding of how to access instructions and care plan via MyChart confirmed with patient.     George Ina RN, BSN, CCM [employer], Population Health Case Manager Phone: [phone]

## 2023-11-17 ENCOUNTER — Ambulatory Visit (INDEPENDENT_AMBULATORY_CARE_PROVIDER_SITE_OTHER): Payer: Medicare Other

## 2023-11-17 ENCOUNTER — Other Ambulatory Visit: Payer: Self-pay | Admitting: Family Medicine

## 2023-11-17 VITALS — Ht 67.0 in | Wt 165.0 lb

## 2023-11-17 DIAGNOSIS — K219 Gastro-esophageal reflux disease without esophagitis: Secondary | ICD-10-CM

## 2023-11-17 DIAGNOSIS — Z0000 Encounter for general adult medical examination without abnormal findings: Secondary | ICD-10-CM | POA: Diagnosis not present

## 2023-11-17 MED ORDER — PANTOPRAZOLE SODIUM 40 MG PO TBEC: 40.0000 mg | DELAYED_RELEASE_TABLET | Freq: Every day | ORAL | 0 refills | Status: DC

## 2023-11-17 NOTE — Patient Instructions (Signed)
[name] , Thank you for taking time to come for your Medicare Wellness Visit. I appreciate your ongoing commitment to your health goals. Please review the following plan we discussed and let me know if I can assist you in the future.   Referrals/Orders/Follow-Ups/Clinician Recommendations: none  This is a list of the screening recommended for you and due dates:  Health Maintenance  Topic Date Due   Yearly kidney health urinalysis for diabetes  10/08/2023   Hemoglobin A1C  11/12/2023   Zoster (Shingles) Vaccine (2 of 2) 11/26/2023*   Flu Shot  01/18/2024*   COVID-19 Vaccine (6 - 2024-25 season) 09/10/2025*   Mammogram  01/27/2024   Eye exam for diabetics  05/06/2024   Yearly kidney function blood test for diabetes  09/28/2024   Medicare Annual Wellness Visit  11/16/2024   DTaP/Tdap/Td vaccine (3 - Tdap) 02/05/2033   Pneumonia Vaccine  Completed   DEXA scan (bone density measurement)  Completed   HPV Vaccine  Aged Out   Complete foot exam   Discontinued  *Topic was postponed. The date shown is not the original due date.    Advanced directives: (Copy Requested) Please bring a copy of your health care power of attorney and living will to the office to be added to your chart at your convenience.  Next Medicare Annual Wellness Visit scheduled for next year: Yes 11/17/2024 @ 3pm televisit

## 2023-11-17 NOTE — Progress Notes (Signed)
Subjective:   [name] is a 83 y.o. female who presents for Medicare Annual (Subsequent) preventive examination.  Visit Complete: Virtual I connected with  [name] on 11/17/23 by a audio enabled telemedicine application and verified that I am speaking with the correct person using two identifiers.  Patient Location: Home  Provider Location: Home Office  I discussed the limitations of evaluation and management by telemedicine. The patient expressed understanding and agreed to proceed.  Vital Signs: Because this visit was a virtual/telehealth visit, some criteria may be missing or patient reported. Any vitals not documented were not able to be obtained and vitals that have been documented are patient reported.  Patient Medicare AWV questionnaire was completed by the patient on (not done); I have confirmed that all information answered by patient is correct and no changes since this date.  Cardiac Risk Factors include: advanced age (>40men, >35 women);diabetes mellitus;dyslipidemia;hypertension;sedentary lifestyle    Objective:    Today's Vitals   11/17/23 1550 11/17/23 1552  Weight: 165 lb (74.8 kg)   Height: 5\' 7"  (1.702 m)   PainSc:  7    Body mass index is 25.84 kg/m.     11/17/2023    4:11 PM 11/14/2022   12:39 PM 02/03/2022    4:12 PM 11/11/2021    2:48 PM 11/09/2020    2:46 PM 07/06/2020    5:52 AM 01/30/2020    3:32 PM  Advanced Directives  Does Patient Have a Medical Advance Directive? Yes Yes Yes No No No Yes  Type of Estate agent of [place];Living will Healthcare Power of [place];Living will     Healthcare Power of [place];Living will  Does patient want to make changes to medical advance directive?  No - Patient declined       Copy of Healthcare Power of Attorney in Chart?  No - copy requested     No - copy requested  Would patient like information on creating a medical advance directive?    Yes (MAU/Ambulatory/Procedural Areas  - Information given) No - Patient declined No - Patient declined     Current Medications (verified) Outpatient Encounter Medications as of 11/17/2023  Medication Sig   Accu-Chek Softclix Lancets lancets Check glucose level daily and as needed for diabetes type 2  (lancets for the soft clicks lancing device AccuChek meter)   acetaminophen (TYLENOL) 325 MG tablet Take 650 mg by mouth every 6 (six) hours as needed.   albuterol (PROVENTIL HFA;VENTOLIN HFA) 108 (90 Base) MCG/ACT inhaler Inhale 2 puffs into the lungs every 4 (four) hours as needed for wheezing or shortness of breath. Please give spacer if possible   amLODipine (NORVASC) 5 MG tablet Take 1 tablet (5 mg total) by mouth daily.   atorvastatin (LIPITOR) 10 MG tablet Take 0.5 tablets (5 mg total) by mouth every other day.   blood glucose meter kit and supplies To check glucose daily and prn for DM2 E11.9   chlorthalidone (HYGROTON) 25 MG tablet Take 1 tablet (25 mg total) by mouth daily.   dicyclomine (BENTYL) 10 MG/5ML solution Take 1-2 mLs (2-4 mg total) by mouth 4 (four) times daily -  before meals and at bedtime. Prev with blurry vision on higher dose but not an allergy and may be able to tolerate lower dose.   docusate sodium (COLACE) 100 MG capsule Take 1 capsule (100 mg total) by mouth 2 (two) times daily.   EPINEPHrine (EPIPEN IJ) Inject as directed as needed. Reported on 12/05/2015  fluticasone (FLONASE) 50 MCG/ACT nasal spray PLACE 2 SPRAYS INTO THE NOSE DAILY.   folic acid (FOLVITE) 1 MG tablet Take 1 mg by mouth daily.   glipiZIDE (GLUCOTROL XL) 2.5 MG 24 hr tablet Take 1 tablet (2.5 mg total) by mouth daily with breakfast.   glucose blood (ACCU-CHEK GUIDE) test strip USE TO CHECK BLOOD SUGAR DAILY   hydroxychloroquine (PLAQUENIL) 200 MG tablet Take 200 mg by mouth 2 (two) times daily.   loratadine (CLARITIN) 10 MG tablet Take 10 mg by mouth daily. MAY TAKE AT HS ALSO   methotrexate (RHEUMATREX) 2.5 MG tablet Take 15 mg by mouth  once a week. Caution:Chemotherapy. Protect from light. ( Tuesday of each week )   ondansetron (ZOFRAN) 4 MG tablet Take 1 tablet (4 mg total) by mouth every 8 (eight) hours as needed for nausea or vomiting. Caution of sedation   pantoprazole (PROTONIX) 40 MG tablet TAKE 1 TABLET BY MOUTH EVERY DAY AT NOON   polyethylene glycol (MIRALAX / GLYCOLAX) packet Take 17 g by mouth daily.   potassium chloride (KLOR-CON M10) 10 MEQ tablet TAKE 1 TABLET BY MOUTH 2 TIMES DAILY.   predniSONE (DELTASONE) 5 MG tablet Take 5 mg by mouth daily.   triamcinolone cream (KENALOG) 0.1 % Apply 1 Application topically 2 (two) times daily. On rash/affected area   cephALEXin (KEFLEX) 500 MG capsule Take 1 capsule (500 mg total) by mouth 2 (two) times daily. (Patient not taking: Reported on 11/17/2023)   doxycycline (VIBRA-TABS) 100 MG tablet Take 1 tablet (100 mg total) by mouth 2 (two) times daily. (Patient not taking: Reported on 11/17/2023)   No facility-administered encounter medications on file as of 11/17/2023.    Allergies (verified) Bee venom, Dicyclomine, Humira [adalimumab], and Metformin and related   History: Past Medical History:  Diagnosis Date   Allergic rhinitis    Cataract 2015   bilateral   COVID-19 10/2019   ASYMPTOMATIC   Diabetes mellitus without complication (HCC)    Diverticulosis    Esophageal reflux    Essential hypertension 01/07/2016   Fatty liver 2018   Fibula fracture 02/2005   Former smoker    Gastritis 2003   Heart murmur    MILD, NO CARDIOLOGIST   Hemorrhoids    History of cardiac dysrhythmia 2011   ABLATION  AT [facility]   History of kidney stones YRS AGO   Hyperglycemia    Hyperlipemia    Osteoarthritis    Personal history of colonic polyps 1998   hyperplastic   PMB (postmenopausal bleeding)    Pre-diabetes    BORDERLINE DIET CONTROLLED DOES NOT CHECK CBG   RA (rheumatoid arthritis) (HCC)    Rectal bleed 2018   TRANSFUSION GIVEN   Shingles 2012   Uterine fibroid     Past Surgical History:  Procedure Laterality Date   CHOLECYSTECTOMY  YRS AGO   COLONOSCOPY  11/08   internal hemorrhoids   DILATATION & CURETTAGE/HYSTEROSCOPY WITH MYOSURE N/A 07/06/2020   Procedure: DILATATION & CURETTAGE/HYSTEROSCOPY;  Surgeon: Theresia Majors, MD;  Location: [facility];  Service: Gynecology;  Laterality: N/A;  request 7:30am OR time in [place] Gyn block requests 30 minutes   EP procedure  1994   SVT; normal echo   ESOPHAGOGASTRODUODENOSCOPY  11/03   reactive gastropathy   EYE SURGERY Bilateral 2017   CATARACTS   LIPOMA EXCISION  11/2002   SVT s/p surgery--? ablation  2011   AT [facility]   TUBAL LIGATION  YRS AGO   vaginal  polyp excised  12/2000   benign   Family History  Problem Relation Age of Onset   Colon cancer Father 22   Diabetes Mother    Heart disease Mother    Kidney disease Mother        ESRD   Stroke Mother    Hypertension Brother    Breast cancer Sister 28   Diabetes Other        nephews   Colon cancer Sister    Esophageal cancer Neg Hx    Stomach cancer Neg Hx    Rectal cancer Neg Hx    Social History   Socioeconomic History   Marital status: Married    Spouse name: Not on file   Number of children: 3   Years of education: Not on file   Highest education level: Not on file  Occupational History   Occupation: retired    Associate Professor: RETIRED  Tobacco Use   Smoking status: Former    Current packs/day: 0.00    Average packs/day: 0.8 packs/day for 15.0 years (11.3 ttl pk-yrs)    Types: Cigarettes    Start date: 10/20/1988    Quit date: 10/21/2003    Years since quitting: 20.0   Smokeless tobacco: Never  Vaping Use   Vaping status: Never Used  Substance and Sexual Activity   Alcohol use: No    Alcohol/week: 0.0 standard drinks of alcohol   Drug use: No   Sexual activity: Yes    Comment: 1st intercourse 83 yo-Fewer than 5 partners  Other Topics Concern   Not on file  Social History Narrative   Married       Retired      Occasional caffeine      No regular exercise   Social Drivers of Corporate investment banker Strain: Low Risk  (11/17/2023)   Overall Financial Resource Strain (CARDIA)    Difficulty of Paying Living Expenses: Not hard at all  Food Insecurity: No Food Insecurity (11/17/2023)   Hunger Vital Sign    Worried About Running Out of Food in the Last Year: Never true    Ran Out of Food in the Last Year: Never true  Transportation Needs: No Transportation Needs (11/17/2023)   PRAPARE - Administrator, Civil Service (Medical): No    Lack of Transportation (Non-Medical): No  Physical Activity: Inactive (11/17/2023)   Exercise Vital Sign    Days of Exercise per Week: 0 days    Minutes of Exercise per Session: 0 min  Stress: No Stress Concern Present (11/17/2023)   [employer] of Occupational Health - Occupational Stress Questionnaire    Feeling of Stress : Not at all  Social Connections: Moderately Isolated (11/17/2023)   Social Connection and Isolation Panel [NHANES]    Frequency of Communication with Friends and Family: More than three times a week    Frequency of Social Gatherings with Friends and Family: More than three times a week    Attends Religious Services: Never    Database administrator or Organizations: No    Attends Engineer, structural: Never    Marital Status: Married    Tobacco Counseling Counseling given: Not Answered  Clinical Intake:  Pre-visit preparation completed: No  Pain : 0-10 Pain Score: 7  Pain Type: Chronic pain Pain Location: Back (shoulders, legs) Pain Descriptors / Indicators: Aching Pain Onset: More than a month ago Pain Frequency: Constant Pain Relieving Factors: heat, medications  Pain Relieving Factors: heat, medications  BMI - recorded: 25.84 Nutritional Status: BMI 25 -29 Overweight Nutritional Risks: None Diabetes: Yes CBG done?: Yes (BS 84 this am at home) CBG resulted in Enter/ Edit results?:  No Did pt. bring in CBG monitor from home?: No  How often do you need to have someone help you when you read instructions, pamphlets, or other written materials from your doctor or pharmacy?: 1 - Never  Interpreter Needed?: No  Comments: lives with husband Information entered by :: B.Thelma Lorenzetti,LPN   Activities of Daily Living    11/17/2023    4:11 PM  In your present state of health, do you have any difficulty performing the following activities:  Hearing? 0  Vision? 0  Difficulty concentrating or making decisions? 0  Walking or climbing stairs? 1  Dressing or bathing? 1  Doing errands, shopping? 1  Preparing Food and eating ? N  Using the Toilet? N  In the past six months, have you accidently leaked urine? N  Do you have problems with loss of bowel control? N  Managing your Medications? N  Managing your Finances? N  Housekeeping or managing your Housekeeping? N    Patient Care Team: Tower, Audrie Gallus, MD as PCP - General Pricilla Riffle, MD as Consulting Physician (Cardiology) Meryl Dare, MD (Inactive) as Consulting Physician (Gastroenterology) Irene Limbo., MD as Consulting Physician (Ophthalmology) Kathyrn Sheriff, [facility] (Inactive) as Pharmacist (Pharmacist) [facility], [place], [place], LCSW as Social Worker Otho Ket, RN as VBCI Care Management  Indicate any recent Medical Services you may have received from other than [facility] providers in the past year (date may be approximate).     Assessment:   This is a routine wellness examination for [name].  Hearing/Vision screen Hearing Screening - Comments:: Pt says her hearing is pretty good Vision Screening - Comments:: Pt says she wears readers;sees well Dr Alvester Morin   Goals Addressed               This Visit's Progress     Increase physical activity   Not on track     Starting 10/23/2017, I will continue to walk for 10 minutes daily.       Increase physical activity (pt-stated)    Not on track     Walk in the house for exercise      Management and education of chronic health conditions.   On track     Interventions Today    Flowsheet Row Most Recent Value  Chronic Disease   Chronic disease during today's visit Hypertension (HTN), Other  [rheumatoid arthritis, falls, status post pneumonia]  General Interventions   General Interventions Discussed/Reviewed General Interventions Reviewed, Doctor Visits  [evaluation of current treatment plan for listed health conditions and patients adherence to plan as established by provider. Asssessed for BP readings, resolution of pneumonia symptoms, pain level.]  Doctor Visits Discussed/Reviewed Doctor Visits Reviewed  [reminded patient of upcoming AWV scheduled for 11/17/23. Reviewed remainder upcoming provider visits. Advised to keep follow up visits as recommended. confirmed patient has scheduled appointment with rheumatologist.]  Education Interventions   Education Provided Provided Education  [Advised to continue monitoring BP and record. Advised to take BP readings to next provider appointment.]  Provided Verbal Education On Other  [Advised to notify provider for any new or ongoing symptoms.]  Nutrition Interventions   Nutrition Discussed/Reviewed Nutrition Reviewed, Supplemental nutrition, Increasing proteins  [Advised to restart drinking nutritional supplement daily due to loss in appetite and weight. Discussed importance  of eating more protein to combat muscle waisting. Advised to let provider know of lack of appetite and weight loss. Advised to stay hydrated]  Pharmacy Interventions   Pharmacy Dicussed/Reviewed Pharmacy Topics Reviewed  [medications reviewed and compliance discussed.]  Safety Interventions   Safety Discussed/Reviewed Fall Risk  [Fall prevention discussed. Advised to use ambulatory devices as recommended.]               Patient Stated   On track     01/30/2020, I will maintain and continue medications as  prescribed.       COMPLETED: Patient Stated   On track     11/09/2020, I will maintain and continue medications as prescribed.        Depression Screen    11/17/2023    4:08 PM 09/11/2023   10:10 AM 08/26/2023   10:59 AM 05/27/2023    3:23 PM 03/02/2023    2:23 PM 01/28/2023   11:31 AM 01/06/2023   11:36 AM  PHQ 2/9 Scores  PHQ - 2 Score 1 1 0 2 6 4 3   PHQ- 9 Score   4 8 9 7 9     Fall Risk    11/17/2023    4:52 PM 11/17/2023    3:57 PM 03/02/2023    2:23 PM 01/06/2023   11:35 AM 11/14/2022   12:48 PM  Fall Risk   Falls in the past year?  1 1 1  0  Number falls in past yr:  1 1 0 0  Injury with Fall?  0 0 0 0  Risk for fall due to : History of fall(s) Orthopedic patient;Impaired balance/gait;Impaired mobility History of fall(s) History of fall(s)   Follow up  Education provided;Falls prevention discussed Falls evaluation completed Falls evaluation completed Falls evaluation completed;Falls prevention discussed    MEDICARE RISK AT HOME: Medicare Risk at Home Any stairs in or around the home?: No If so, are there any without handrails?: No Home free of loose throw rugs in walkways, pet beds, electrical cords, etc?: Yes Adequate lighting in your home to reduce risk of falls?: Yes Life alert?: No Use of a cane, walker or w/c?: Yes Grab bars in the bathroom?: No Shower chair or bench in shower?: Yes Elevated toilet seat or a handicapped toilet?: Yes  TIMED UP AND GO:  Was the test performed?  No    Cognitive Function:    11/09/2020    2:52 PM 01/30/2020    3:35 PM 10/23/2017   10:46 AM 01/03/2016   11:40 AM  MMSE - Mini Mental State Exam  Orientation to time 5 5 5 5   Orientation to Place 5 5 5 5   Registration 3 3 3 3   Attention/ Calculation 5 5 0 5  Recall 3 3 3 3   Language- name 2 objects   0 0  Language- repeat 1 1 1 1   Language- follow 3 step command   3 3  Language- read & follow direction   0 1  Write a sentence   0 0  Copy design   0 0  Total score   20 26         11/17/2023    4:13 PM 11/14/2022   12:49 PM  6CIT Screen  What Year? 0 points 0 points  What month? 0 points 0 points  What time? 0 points 0 points  Count back from 20 0 points 0 points  Months in reverse 4 points 0 points  Repeat phrase 10 points 0  points  Total Score 14 points 0 points    Immunizations Immunization History  Administered Date(s) Administered   Fluad Quad(high Dose 65+) 08/14/2020, 07/22/2022   Influenza,inj,Quad PF,6+ Mos 08/10/2015, 08/28/2016, 10/27/2017, 07/27/2018   PFIZER Comirnaty(Gray Top)Covid-19 Tri-Sucrose Vaccine 07/06/2022   PFIZER(Purple Top)SARS-COV-2 Vaccination 12/18/2019, 01/17/2020, 10/18/2020, 04/29/2021   Pneumococcal Conjugate-13 01/03/2016   Pneumococcal Polysaccharide-23 04/24/2014   Td 09/04/2006, 02/06/2023   Zoster Recombinant(Shingrix) 11/12/2022    TDAP status: Up to date  Flu Vaccine status: Up to date  Pneumococcal vaccine status: Up to date  Covid-19 vaccine status: Completed vaccines  Qualifies for Shingles Vaccine? Yes   Zostavax completed Yes  #1 Shingrix Completed?: No.    Education has been provided regarding the importance of this vaccine. Patient has been advised to call insurance company to determine out of pocket expense if they have not yet received this vaccine. Advised may also receive vaccine at local pharmacy or Health Dept. Verbalized acceptance and understanding.  Screening Tests Health Maintenance  Topic Date Due   Diabetic kidney evaluation - Urine ACR  10/08/2023   HEMOGLOBIN A1C  11/12/2023   Zoster Vaccines- Shingrix (2 of 2) 11/26/2023 (Originally 01/07/2023)   INFLUENZA VACCINE  01/18/2024 (Originally 05/21/2023)   COVID-19 Vaccine (6 - 2024-25 season) 09/10/2025 (Originally 06/21/2023)   MAMMOGRAM  01/27/2024   OPHTHALMOLOGY EXAM  05/06/2024   Diabetic kidney evaluation - eGFR measurement  09/28/2024   Medicare Annual Wellness (AWV)  11/16/2024   DTaP/Tdap/Td (3 - Tdap) 02/05/2033   Pneumonia  Vaccine 9+ Years old  Completed   DEXA SCAN  Completed   HPV VACCINES  Aged Out   FOOT EXAM  Discontinued    Health Maintenance  Health Maintenance Due  Topic Date Due   Diabetic kidney evaluation - Urine ACR  10/08/2023   HEMOGLOBIN A1C  11/12/2023    Colorectal cancer screening: No longer required.   Mammogram status: No longer required due to age.  Bone Density status: Completed 10/16/2016. Results reflect: Bone density results: NORMAL. Repeat every 5 years. Pt does not desire this  Lung Cancer Screening: (Low Dose CT Chest recommended if Age 20-80 years, 20 pack-year currently smoking OR have quit w/in 15years.) does not qualify.   Lung Cancer Screening Referral: no  Additional Screening:  Hepatitis C Screening: does not qualify; Completed no  Vision Screening: Recommended annual ophthalmology exams for early detection of glaucoma and other disorders of the eye. Is the patient up to date with their annual eye exam?  Yes  Who is the provider or what is the name of the office in which the patient attends annual eye exams? Dr Alvester Morin If pt is not established with a provider, would they like to be referred to a provider to establish care? No .   Dental Screening: Recommended annual dental exams for proper oral hygiene  Diabetic Foot Exam: Diabetic Foot Exam: Completed 01/05/23  Community Resource Referral / Chronic Care Management: CRR required this visit?  No   CCM required this visit?  Appt made with PCP for PE    Plan:     I have personally reviewed and noted the following in the patient's chart:   Medical and social history Use of alcohol, tobacco or illicit drugs  Current medications and supplements including opioid prescriptions. Patient is not currently taking opioid prescriptions. Functional ability and status Nutritional status Physical activity Advanced directives List of other physicians Hospitalizations, surgeries, and ER visits in previous 12  months Vitals Screenings to include cognitive,  depression, and falls Referrals and appointments  In addition, I have reviewed and discussed with patient certain preventive protocols, quality metrics, and best practice recommendations. A written personalized care plan for preventive services as well as general preventive health recommendations were provided to patient.     Sue Lush, LPN   7/82/4235   After Visit Summary: (Declined) Due to this being a telephonic visit, with patients personalized plan was offered to patient but patient Declined AVS at this time   Nurse Notes: Pt states she hurts all the time due to arthritis. She says she only takes Tylenol for pain but does not help very much. Pt says she doe snot do much walking unless her husband is home due to potential fall and needing help.

## 2023-11-25 ENCOUNTER — Telehealth: Payer: Self-pay | Admitting: *Deleted

## 2023-11-25 NOTE — Telephone Encounter (Signed)
 I didn't call pt but it may have been a reminder call for her CPE next week

## 2023-11-25 NOTE — Telephone Encounter (Signed)
 Copied from CRM [id number]. Topic: General - Call Back - No Documentation >> Nov 25, 2023  9:16 AM [name] wrote: Reason for CRM: patient stated she got a cal from the office I looked in patient chart there where no notes regarding a call today

## 2023-12-02 ENCOUNTER — Encounter: Payer: Self-pay | Admitting: Family Medicine

## 2023-12-02 ENCOUNTER — Ambulatory Visit: Payer: Medicare Other | Admitting: Family Medicine

## 2023-12-02 VITALS — BP 128/70 | HR 66 | Temp 98.3°F | Ht 67.0 in | Wt 163.5 lb

## 2023-12-02 DIAGNOSIS — E1169 Type 2 diabetes mellitus with other specified complication: Secondary | ICD-10-CM | POA: Diagnosis not present

## 2023-12-02 DIAGNOSIS — F4323 Adjustment disorder with mixed anxiety and depressed mood: Secondary | ICD-10-CM

## 2023-12-02 DIAGNOSIS — E2839 Other primary ovarian failure: Secondary | ICD-10-CM | POA: Insufficient documentation

## 2023-12-02 DIAGNOSIS — E785 Hyperlipidemia, unspecified: Secondary | ICD-10-CM | POA: Diagnosis not present

## 2023-12-02 DIAGNOSIS — Z0000 Encounter for general adult medical examination without abnormal findings: Secondary | ICD-10-CM | POA: Diagnosis not present

## 2023-12-02 DIAGNOSIS — R4589 Other symptoms and signs involving emotional state: Secondary | ICD-10-CM

## 2023-12-02 DIAGNOSIS — I10 Essential (primary) hypertension: Secondary | ICD-10-CM

## 2023-12-02 DIAGNOSIS — Z79899 Other long term (current) drug therapy: Secondary | ICD-10-CM | POA: Diagnosis not present

## 2023-12-02 DIAGNOSIS — M069 Rheumatoid arthritis, unspecified: Secondary | ICD-10-CM

## 2023-12-02 DIAGNOSIS — K219 Gastro-esophageal reflux disease without esophagitis: Secondary | ICD-10-CM

## 2023-12-02 DIAGNOSIS — Z7984 Long term (current) use of oral hypoglycemic drugs: Secondary | ICD-10-CM

## 2023-12-02 DIAGNOSIS — E876 Hypokalemia: Secondary | ICD-10-CM

## 2023-12-02 DIAGNOSIS — Z23 Encounter for immunization: Secondary | ICD-10-CM

## 2023-12-02 DIAGNOSIS — E119 Type 2 diabetes mellitus without complications: Secondary | ICD-10-CM

## 2023-12-02 LAB — LIPID PANEL
Cholesterol: 157 mg/dL (ref 0–200)
HDL: 34.1 mg/dL — ABNORMAL LOW (ref >39.00)
LDL Cholesterol: 76 mg/dL (ref 0–99)
NonHDL: 122.83
Total CHOL/HDL Ratio: 5
Triglycerides: 234 mg/dL — ABNORMAL HIGH (ref 0.0–149.0)
VLDL: 46.8 mg/dL — ABNORMAL HIGH (ref 0.0–40.0)

## 2023-12-02 LAB — CBC WITH DIFFERENTIAL/PLATELET
Basophils Absolute: 0 10*3/uL (ref 0.0–0.1)
Basophils Relative: 0.4 % (ref 0.0–3.0)
Eosinophils Absolute: 0.1 10*3/uL (ref 0.0–0.7)
Eosinophils Relative: 1.6 % (ref 0.0–5.0)
HCT: 38.7 % (ref 36.0–46.0)
Hemoglobin: 12.7 g/dL (ref 12.0–15.0)
Lymphocytes Relative: 25.5 % (ref 12.0–46.0)
Lymphs Abs: 0.9 10*3/uL (ref 0.7–4.0)
MCHC: 32.9 g/dL (ref 30.0–36.0)
MCV: 84.8 fL (ref 78.0–100.0)
Monocytes Absolute: 0.1 10*3/uL (ref 0.1–1.0)
Monocytes Relative: 1.4 % — ABNORMAL LOW (ref 3.0–12.0)
Neutro Abs: 2.6 10*3/uL (ref 1.4–7.7)
Neutrophils Relative %: 71.1 % (ref 43.0–77.0)
Platelets: 318 10*3/uL (ref 150.0–400.0)
RBC: 4.57 Mil/uL (ref 3.87–5.11)
RDW: 16 % — ABNORMAL HIGH (ref 11.5–15.5)
WBC: 3.6 10*3/uL — ABNORMAL LOW (ref 4.0–10.5)

## 2023-12-02 LAB — TSH: TSH: 1.71 u[IU]/mL (ref 0.35–5.50)

## 2023-12-02 LAB — VITAMIN B12: Vitamin B-12: 221 pg/mL (ref 211–911)

## 2023-12-02 NOTE — Assessment & Plan Note (Signed)
Blood pressure is stable  BP: 128/70   Urged to continue the chlorthalidone at 25 mg daily  Amlodipine 5 mg daily  Tolerating medications   Check at home when able Last labs reviewed

## 2023-12-02 NOTE — Assessment & Plan Note (Signed)
On chronic prednisone  A1c ordered  Diet if fair  Reviewed goals for low glycemic eating  Microalb ordered  Eye exam utd  Continues glipizide xl 2.5 mg daily  Urged to watch for low glucose   She states she is unable to check in afternoon due to inability to get blood from finger later in day  Encouraged more fluids and warming up hands

## 2023-12-02 NOTE — Assessment & Plan Note (Signed)
Disc goals for lipids and reasons to control them Rev last labs with pt Rev low sat fat diet in detail Labs today  Continues atorvastatin 5 mg every other day (most she can tolerate)

## 2023-12-02 NOTE — Assessment & Plan Note (Signed)
Reviewed health habits including diet and exercise and skin cancer prevention Reviewed appropriate screening tests for age  Also reviewed health mt list, fam hx and immunization status , as well as social and family history   See HPI Labs reviewed and ordered Health Maintenance  Topic Date Due   Yearly kidney health urinalysis for diabetes  10/08/2023   Hemoglobin A1C  11/12/2023   Zoster (Shingles) Vaccine (2 of 2) 02/28/2025*   COVID-19 Vaccine (6 - 2024-25 season) 09/10/2025*   Mammogram  01/27/2024   Eye exam for diabetics  05/06/2024   Yearly kidney function blood test for diabetes  09/28/2024   Medicare Annual Wellness Visit  11/16/2024   DTaP/Tdap/Td vaccine (3 - Tdap) 02/05/2033   Pneumonia Vaccine  Completed   Flu Shot  Completed   DEXA scan (bone density measurement)  Completed   HPV Vaccine  Aged Out   Complete foot exam   Discontinued  *Topic was postponed. The date shown is not the original due date.   Flu shot today  Plans to get 2nd shingrix at pharmacy  Plans mammo in April  Dexa ordered Discussed fall prevention, supplements and exercise for bone density  Phq 2  declines treatment

## 2023-12-02 NOTE — Assessment & Plan Note (Addendum)
Recent PHQ 2 Pt states this is due to chronic pain  Declines counseling or treatment  Encouraged self care  Encouraged to let us know if she changes mind  about treatment

## 2023-12-02 NOTE — Assessment & Plan Note (Signed)
Deprends on protonix 40 mg  Cannot go w/o it  Encouraged to avoid food triggers

## 2023-12-02 NOTE — Assessment & Plan Note (Signed)
Due to severe chronic pain  Declines treatment

## 2023-12-02 NOTE — Assessment & Plan Note (Signed)
B12 added to labs

## 2023-12-02 NOTE — Patient Instructions (Addendum)
Flu shot today   Get your 2nd shingrix shot at the pharmacy in a month or more   Try to get 1200-1500 mg of calcium per day with at least 1000 iu of vitamin D - for bone health  Labs today    Call to schedule your bone density test You have an order for:  []   2D Mammogram  []   3D Mammogram  [x]   Bone Density     Please call for appointment:   []   [facility]  [address] [place] [address]  [phone]  []   [facility]   [address]  [place], [place] [address]  [phone]  [x]   [facility]      [address] [place], [place]        [phone]         []   [facility]  [address] [place], [place]  [phone]  []  [facility]   [address]   [place], [place] [address]  [phone]  []  [facility]  [address] [place], [place] [address] [phone]    Make sure to wear two piece clothing  No lotions powders or deodorants the day of the appointment Make sure to bring picture ID and insurance card.  Bring list of medications you are currently taking including any supplements.   Schedule your screening mammogram through MyChart!   Select [place] imaging sites can now be scheduled through MyChart.  Log into your MyChart account.  Go to 'Visit' (or 'Appointments' if  on mobile App) --> Schedule an  Appointment  Under 'Select a Reason for Visit' choose the Mammogram  Screening option.  Complete the pre-visit questions  and select the time and place that  best fits your schedule

## 2023-12-02 NOTE — Assessment & Plan Note (Signed)
Rheum care  Plaquenil  Methotrexate Prednisone  Symptoms are still mod to severe  Reviewed last labs Had inj in knees  Does not function well due to pain

## 2023-12-02 NOTE — Assessment & Plan Note (Signed)
Lab Results  Component Value Date   K 3.6 09/29/2023   Continues klor con 10 meq bid

## 2023-12-02 NOTE — Assessment & Plan Note (Signed)
Dexa ordered

## 2023-12-02 NOTE — Progress Notes (Signed)
Subjective:    Patient ID: [name], female    DOB: [birth year], 83 y.o.   MRN: [id number]  HPI  Here for health maintenance exam and to review chronic medical problems   Wt Readings from Last 3 Encounters:  12/02/23 163 lb 8 oz (74.2 kg)  11/17/23 165 lb (74.8 kg)  09/04/23 165 lb 6 oz (75 kg)   25.61 kg/m  Vitals:   12/02/23 1431  BP: 128/70  Pulse: 66  Temp: 98.3 F (36.8 C)  SpO2: 97%    Immunization History  Administered Date(s) Administered   Fluad Quad(high Dose 65+) 08/14/2020, 07/22/2022   Fluad Trivalent(High Dose 65+) 12/02/2023   Influenza,inj,Quad PF,6+ Mos 08/10/2015, 08/28/2016, 10/27/2017, 07/27/2018   PFIZER Comirnaty(Gray Top)Covid-19 Tri-Sucrose Vaccine 07/06/2022   PFIZER(Purple Top)SARS-COV-2 Vaccination 12/18/2019, 01/17/2020, 10/18/2020, 04/29/2021   Pneumococcal Conjugate-13 01/03/2016   Pneumococcal Polysaccharide-23 04/24/2014   Td 09/04/2006, 02/06/2023   Zoster Recombinant(Shingrix) 11/12/2022    Health Maintenance Due  Topic Date Due   Diabetic kidney evaluation - Urine ACR  10/08/2023   HEMOGLOBIN A1C  11/12/2023   Shingrix vaccine -due for 2nd   Flu shot -today  Had pneumonia in 08/2023  Still fatigued    Mammogram 01/2023 - will be due in April and plans to  Self breast exam: no lumps   Gyn health-goes to gyn in [place] for regular exams   Colon cancer screening -colonoscopy 2017 Has out aged screening  Bone health  Dexa  2017 in normal range Is taking chronic prednisone  Falls- has had several falls  Fractures-none  Supplements  Last vitamin D No results found for: "25OHVITD2", "25OHVITD3", "VD25OH"  Exercise  Very limited due to severe rheum pain    Mood    11/17/2023    4:08 PM 09/11/2023   10:10 AM 08/26/2023   10:59 AM 05/27/2023    3:23 PM 03/02/2023    2:23 PM  Depression screen PHQ 2/9  Decreased Interest 0 0 0 1 3  Down, Depressed, Hopeless 1 1 0 1 3  PHQ - 2 Score 1 1 0 2 6  Altered  sleeping   3 3 0  Tired, decreased energy   0 1 2  Change in appetite   0 1 0  Feeling bad or failure about yourself    0 1 1  Trouble concentrating   0 0 0  Moving slowly or fidgety/restless   0 0 0  Suicidal thoughts   1 0 0  PHQ-9 Score   4 8 9   Difficult doing work/chores   Extremely dIfficult Very difficult Not difficult at all   Was depressed about her chronic pain  Nothing else  Declines counseling or any treatment    HTN bp is stable today  No cp or palpitations or headaches or edema  No side effects to medicines  BP Readings from Last 3 Encounters:  12/02/23 128/70  09/04/23 132/66  08/26/23 134/70    Chlorthalidone 25 mg daily  Amlodiine 5 mg daily   Lab Results  Component Value Date   NA 142 09/29/2023   K 3.6 09/29/2023   CO2 26 09/29/2023   GLUCOSE 137 (H) 09/29/2023   BUN 8 09/29/2023   CREATININE 0.78 09/29/2023   CALCIUM 9.1 09/29/2023   GFR 71.24 09/29/2023   GFRNONAA >60 01/26/2017   Lab Results  Component Value Date   ALT 10 08/26/2023   AST 28 08/26/2023   ALKPHOS 64 08/26/2023   BILITOT 0.6 08/26/2023  Lab Results  Component Value Date   WBC 3.6 (L) 12/02/2023   HGB 12.7 12/02/2023   HCT 38.7 12/02/2023   MCV 84.8 12/02/2023   PLT 318.0 12/02/2023     DM2 Lab Results  Component Value Date   HGBA1C 6.8 (H) 05/12/2023   HGBA1C 7.2 (H) 02/06/2023   HGBA1C 6.6 (A) 07/22/2022   Glipizide xl 2.5 mg daily  Takes chronic prednisone for RA from rheumatology   Glucose at home was 81 -daily   Cannot get blood later in the day  Does drink lots of fluids   Eye exam 04/2023  Lab Results  Component Value Date   MICROALBUR 3.0 (H) 10/07/2022   MICROALBUR 0.9 10/29/2018     Hyperlipidemia Lab Results  Component Value Date   CHOL 157 12/02/2023   HDL 34.10 (L) 12/02/2023   LDLCALC 76 12/02/2023   LDLDIRECT 60.0 10/26/2018   TRIG 234.0 (H) 12/02/2023   CHOLHDL 5 12/02/2023   Atorvastatin 5 mg every other day  GERD Takes  protonix 40 mg - is necessary   Lab Results  Component Value Date   VITAMINB12 221 12/02/2023    RA Rheum care  Plaquenil  Prednisone    Appetite is up and down Mainly down  Eating less   Lab Results  Component Value Date   TSH 1.71 12/02/2023     Patient Active Problem List   Diagnosis Date Noted   Estrogen deficiency 12/02/2023   Depressed mood 09/05/2023   Hypokalemia 09/04/2023   Dizziness 08/26/2023   Decreased appetite 08/26/2023   History of UTI 08/26/2023   Atypical pneumonia 08/26/2023   Heart murmur 08/27/2022   Mobility impaired 07/22/2022   Current use of proton pump inhibitor 10/01/2021   General weakness 10/01/2021   Poor balance 10/01/2021   On prednisone therapy 10/01/2021   Diabetes mellitus treated with oral medication (HCC) 11/13/2020   Hyperlipidemia associated with type 2 diabetes mellitus (HCC) 10/15/2020   Fatigue 01/30/2020   Encounter for screening mammogram for breast cancer 10/27/2017   Routine general medical examination at a health care facility 10/27/2017   Aortic atherosclerosis (HCC) 02/03/2017   RA (rheumatoid arthritis) (HCC)    Adjustment disorder with mixed anxiety and depressed mood 06/24/2016   Cough 06/09/2016   Essential hypertension 01/07/2016   Coronary atherosclerosis 11/07/2015   Osteoarthritis 05/10/2014   GERD 07/09/2007   Past Medical History:  Diagnosis Date   Allergic rhinitis    Cataract 2015   bilateral   COVID-19 10/2019   ASYMPTOMATIC   Diabetes mellitus without complication (HCC)    Diverticulosis    Esophageal reflux    Essential hypertension 01/07/2016   Fatty liver 2018   Fibula fracture 02/2005   Former smoker    Gastritis 2003   Heart murmur    MILD, NO CARDIOLOGIST   Hemorrhoids    History of cardiac dysrhythmia 2011   ABLATION  AT [facility]   History of kidney stones YRS AGO   Hyperglycemia    Hyperlipemia    Osteoarthritis    Personal history of colonic polyps 1998   hyperplastic    PMB (postmenopausal bleeding)    Pre-diabetes    BORDERLINE DIET CONTROLLED DOES NOT CHECK CBG   RA (rheumatoid arthritis) (HCC)    Rectal bleed 2018   TRANSFUSION GIVEN   Shingles 2012   Uterine fibroid    Past Surgical History:  Procedure Laterality Date   CHOLECYSTECTOMY  YRS AGO   COLONOSCOPY  11/08  internal hemorrhoids   DILATATION & CURETTAGE/HYSTEROSCOPY WITH MYOSURE N/A 07/06/2020   Procedure: DILATATION & CURETTAGE/HYSTEROSCOPY;  Surgeon: Theresia Majors, MD;  Location: [facility];  Service: Gynecology;  Laterality: N/A;  request 7:30am OR time in [place] Gyn block requests 30 minutes   EP procedure  1994   SVT; normal echo   ESOPHAGOGASTRODUODENOSCOPY  11/03   reactive gastropathy   EYE SURGERY Bilateral 2017   CATARACTS   LIPOMA EXCISION  11/2002   SVT s/p surgery--? ablation  2011   AT [facility]   TUBAL LIGATION  YRS AGO   vaginal polyp excised  12/2000   benign   Social History   Tobacco Use   Smoking status: Former    Current packs/day: 0.00    Average packs/day: 0.8 packs/day for 15.0 years (11.3 ttl pk-yrs)    Types: Cigarettes    Start date: 10/20/1988    Quit date: 10/21/2003    Years since quitting: 20.1   Smokeless tobacco: Never  Vaping Use   Vaping status: Never Used  Substance Use Topics   Alcohol use: No    Alcohol/week: 0.0 standard drinks of alcohol   Drug use: No   Family History  Problem Relation Age of Onset   Colon cancer Father 12   Diabetes Mother    Heart disease Mother    Kidney disease Mother        ESRD   Stroke Mother    Hypertension Brother    Breast cancer Sister 30   Diabetes Other        nephews   Colon cancer Sister    Esophageal cancer Neg Hx    Stomach cancer Neg Hx    Rectal cancer Neg Hx    Allergies  Allergen Reactions   Bee Venom Anaphylaxis   Dicyclomine     Blurry vision   Humira [Adalimumab]     LIPS SWELLED AND RASH   Metformin And Related     rash   Current Outpatient  Medications on File Prior to Visit  Medication Sig Dispense Refill   Accu-Chek Softclix Lancets lancets Check glucose level daily and as needed for diabetes type 2  (lancets for the soft clicks lancing device AccuChek meter) 100 each 3   acetaminophen (TYLENOL) 325 MG tablet Take 650 mg by mouth every 6 (six) hours as needed.     albuterol (PROVENTIL HFA;VENTOLIN HFA) 108 (90 Base) MCG/ACT inhaler Inhale 2 puffs into the lungs every 4 (four) hours as needed for wheezing or shortness of breath. Please give spacer if possible 1 Inhaler 3   amLODipine (NORVASC) 5 MG tablet Take 1 tablet (5 mg total) by mouth daily. 90 tablet 3   atorvastatin (LIPITOR) 10 MG tablet Take 0.5 tablets (5 mg total) by mouth every other day. 22 tablet 3   blood glucose meter kit and supplies To check glucose daily and prn for DM2 E11.9 1 each 0   chlorthalidone (HYGROTON) 25 MG tablet Take 1 tablet (25 mg total) by mouth daily. 90 tablet 3   dicyclomine (BENTYL) 10 MG/5ML solution Take 1-2 mLs (2-4 mg total) by mouth 4 (four) times daily -  before meals and at bedtime. Prev with blurry vision on higher dose but not an allergy and may be able to tolerate lower dose. 100 mL 3   docusate sodium (COLACE) 100 MG capsule Take 1 capsule (100 mg total) by mouth 2 (two) times daily. 60 capsule 0   EPINEPHrine (EPIPEN IJ) Inject as  directed as needed. Reported on 12/05/2015     fluticasone (FLONASE) 50 MCG/ACT nasal spray PLACE 2 SPRAYS INTO THE NOSE DAILY. 48 g 3   folic acid (FOLVITE) 1 MG tablet Take 1 mg by mouth daily.     glipiZIDE (GLUCOTROL XL) 2.5 MG 24 hr tablet Take 1 tablet (2.5 mg total) by mouth daily with breakfast. 90 tablet 3   glucose blood (ACCU-CHEK GUIDE) test strip USE TO CHECK BLOOD SUGAR DAILY 100 strip 1   hydroxychloroquine (PLAQUENIL) 200 MG tablet Take 200 mg by mouth 2 (two) times daily.     loratadine (CLARITIN) 10 MG tablet Take 10 mg by mouth daily. MAY TAKE AT HS ALSO     methotrexate (RHEUMATREX) 2.5  MG tablet Take 15 mg by mouth once a week. Caution:Chemotherapy. Protect from light. ( Tuesday of each week )     ondansetron (ZOFRAN) 4 MG tablet Take 1 tablet (4 mg total) by mouth every 8 (eight) hours as needed for nausea or vomiting. Caution of sedation 20 tablet 0   pantoprazole (PROTONIX) 40 MG tablet Take 1 tablet (40 mg total) by mouth daily. 90 tablet 0   polyethylene glycol (MIRALAX / GLYCOLAX) packet Take 17 g by mouth daily.     potassium chloride (KLOR-CON M10) 10 MEQ tablet TAKE 1 TABLET BY MOUTH 2 TIMES DAILY. 180 tablet 0   predniSONE (DELTASONE) 5 MG tablet Take 5 mg by mouth daily.     triamcinolone cream (KENALOG) 0.1 % Apply 1 Application topically 2 (two) times daily. On rash/affected area 15 g 0   No current facility-administered medications on file prior to visit.    Review of Systems  Constitutional:  Positive for fatigue. Negative for activity change, appetite change, fever and unexpected weight change.  HENT:  Negative for congestion, ear pain, rhinorrhea, sinus pressure and sore throat.   Eyes:  Negative for pain, redness and visual disturbance.  Respiratory:  Negative for cough, shortness of breath and wheezing.   Cardiovascular:  Negative for chest pain and palpitations.  Gastrointestinal:  Negative for abdominal pain, blood in stool, constipation and diarrhea.  Endocrine: Negative for polydipsia and polyuria.  Genitourinary:  Negative for dysuria, frequency and urgency.  Musculoskeletal:  Positive for arthralgias, back pain, gait problem and myalgias.  Skin:  Negative for pallor and rash.  Allergic/Immunologic: Negative for environmental allergies.  Neurological:  Negative for dizziness, syncope and headaches.  Hematological:  Negative for adenopathy. Does not bruise/bleed easily.  Psychiatric/Behavioral:  Positive for dysphoric mood. Negative for decreased concentration. The patient is not nervous/anxious.        Objective:   Physical  Exam Constitutional:      General: She is not in acute distress.    Appearance: Normal appearance. She is well-developed and normal weight. She is not ill-appearing or diaphoretic.  HENT:     Head: Normocephalic and atraumatic.     Right Ear: Tympanic membrane, ear canal and external ear normal.     Left Ear: Tympanic membrane, ear canal and external ear normal.     Nose: Nose normal. No congestion.     Mouth/Throat:     Mouth: Mucous membranes are moist.     Pharynx: Oropharynx is clear. No posterior oropharyngeal erythema.  Eyes:     General: No scleral icterus.    Extraocular Movements: Extraocular movements intact.     Conjunctiva/sclera: Conjunctivae normal.     Pupils: Pupils are equal, round, and reactive to light.  Neck:  Thyroid: No thyromegaly.     Vascular: No carotid bruit or JVD.  Cardiovascular:     Rate and Rhythm: Normal rate and regular rhythm.     Pulses: Normal pulses.     Heart sounds: Normal heart sounds.     No gallop.  Pulmonary:     Effort: Pulmonary effort is normal. No respiratory distress.     Breath sounds: Normal breath sounds. No wheezing.     Comments: Good air exch Chest:     Chest wall: No tenderness.  Abdominal:     General: Bowel sounds are normal. There is no distension or abdominal bruit.     Palpations: Abdomen is soft. There is no mass.     Tenderness: There is no abdominal tenderness.     Hernia: No hernia is present.  Genitourinary:    Comments: Breast and pelvic exam are done by gyn provider   Musculoskeletal:        General: No tenderness. Normal range of motion.     Cervical back: Normal range of motion and neck supple. No rigidity. No muscular tenderness.     Right lower leg: No edema.     Left lower leg: No edema.     Comments: No kyphosis   Joint changes of RA  Lymphadenopathy:     Cervical: No cervical adenopathy.  Skin:    General: Skin is warm and dry.     Coloration: Skin is not pale.     Findings: No erythema or  rash.  Neurological:     Mental Status: She is alert. Mental status is at baseline.     Cranial Nerves: No cranial nerve deficit.     Motor: No abnormal muscle tone.     Coordination: Coordination normal.     Gait: Gait normal.     Deep Tendon Reflexes: Reflexes are normal and symmetric.  Psychiatric:        Mood and Affect: Mood is depressed. Affect is not tearful.        Cognition and Memory: Cognition and memory normal.           Assessment & Plan:   Problem List Items Addressed This Visit       Cardiovascular and Mediastinum   Essential hypertension   Blood pressure is stable  BP: 128/70   Urged to continue the chlorthalidone at 25 mg daily  Amlodipine 5 mg daily  Tolerating medications   Check at home when able Last labs reviewed        Relevant Orders   CBC with Differential/Platelet (Completed)   TSH (Completed)   Lipid panel (Completed)     Digestive   GERD   Deprends on protonix 40 mg  Cannot go w/o it  Encouraged to avoid food triggers        Endocrine   Hyperlipidemia associated with type 2 diabetes mellitus (HCC)   Disc goals for lipids and reasons to control them Rev last labs with pt Rev low sat fat diet in detail Labs today  Continues atorvastatin 5 mg every other day (most she can tolerate)      Relevant Orders   Lipid panel (Completed)   Diabetes mellitus treated with oral medication (HCC)   On chronic prednisone  A1c ordered  Diet if fair  Reviewed goals for low glycemic eating  Microalb ordered  Eye exam utd  Continues glipizide xl 2.5 mg daily  Urged to watch for low glucose   She states she is unable  to check in afternoon due to inability to get blood from finger later in day  Encouraged more fluids and warming up hands       Relevant Orders   Hemoglobin A1c   Microalbumin / creatinine urine ratio     Musculoskeletal and Integument   RA (rheumatoid arthritis) (HCC)   Rheum care  Plaquenil  Methotrexate Prednisone   Symptoms are still mod to severe  Reviewed last labs Had inj in knees  Does not function well due to pain         Other   Routine general medical examination at a health care facility - Primary   Reviewed health habits including diet and exercise and skin cancer prevention Reviewed appropriate screening tests for age  Also reviewed health mt list, fam hx and immunization status , as well as social and family history   See HPI Labs reviewed and ordered Health Maintenance  Topic Date Due   Yearly kidney health urinalysis for diabetes  10/08/2023   Hemoglobin A1C  11/12/2023   Zoster (Shingles) Vaccine (2 of 2) 02/28/2025*   COVID-19 Vaccine (6 - 2024-25 season) 09/10/2025*   Mammogram  01/27/2024   Eye exam for diabetics  05/06/2024   Yearly kidney function blood test for diabetes  09/28/2024   Medicare Annual Wellness Visit  11/16/2024   DTaP/Tdap/Td vaccine (3 - Tdap) 02/05/2033   Pneumonia Vaccine  Completed   Flu Shot  Completed   DEXA scan (bone density measurement)  Completed   HPV Vaccine  Aged Out   Complete foot exam   Discontinued  *Topic was postponed. The date shown is not the original due date.   Flu shot today  Plans to get 2nd shingrix at pharmacy  Plans mammo in April  Dexa ordered Discussed fall prevention, supplements and exercise for bone density  Phq 2  declines treatment       Hypokalemia   Lab Results  Component Value Date   K 3.6 09/29/2023   Continues klor con 10 meq bid       Estrogen deficiency   Dexa ordered       Relevant Orders   DG Bone Density   Depressed mood   Recent PHQ 2 Pt states this is due to chronic pain  Declines counseling or treatment  Encouraged self care  Encouraged to let us know if she changes mind  about treatment       Current use of proton pump inhibitor   B12 added to labs       Relevant Orders   Vitamin B12 (Completed)   Adjustment disorder with mixed anxiety and depressed mood   Due to severe  chronic pain  Declines treatment       Other Visit Diagnoses       Need for influenza vaccination       Relevant Orders   Flu Vaccine Trivalent High Dose (Fluad) (Completed)

## 2023-12-03 ENCOUNTER — Telehealth: Payer: Self-pay | Admitting: Family Medicine

## 2023-12-03 LAB — HEMOGLOBIN A1C: Hgb A1c MFr Bld: 6.1 % (ref 4.6–6.5)

## 2023-12-03 LAB — MICROALBUMIN / CREATININE URINE RATIO
Creatinine,U: 231.9 mg/dL
Microalb Creat Ratio: 11.3 mg/g (ref 0.0–30.0)
Microalb, Ur: 2.6 mg/dL — ABNORMAL HIGH (ref 0.0–1.9)

## 2023-12-03 NOTE — Telephone Encounter (Signed)
Pt notified of Dr. Royden Purl comments

## 2023-12-03 NOTE — Telephone Encounter (Signed)
Aware That's ok  They can put you on a cancellation list if needed  Is ok to get it in October however

## 2023-12-03 NOTE — Telephone Encounter (Signed)
Copied from CRM [id number]. Topic: General - Other >> Dec 03, 2023 10:52 AM Sim Boast F wrote: Reason for CRM: Patient calling to let Dr. Milinda Antis know that her next appointment for her bone density is in October 2025

## 2023-12-17 ENCOUNTER — Ambulatory Visit: Payer: Self-pay

## 2023-12-17 NOTE — Patient Instructions (Signed)
 Visit Information  Thank you for taking time to visit with me today. Please don't hesitate to contact me if I can be of assistance to you.   Following are the goals we discussed today:   Goals Addressed             This Visit's Progress    Management and education of chronic health conditions.       Interventions Today    Flowsheet Row Most Recent Value  Chronic Disease   Chronic disease during today's visit Hypertension (HTN), Other  [(rheumatoid arthritis, falls, status post pneumonia)]  General Interventions   General Interventions Discussed/Reviewed General Interventions Reviewed, Doctor Visits  [evaluation of current treatment plan for listed health conditions and patients adherence to plan as established by provider. Assessed for ongoing pneumonia and RA symptoms. Assessed for BP readings.]  Doctor Visits Discussed/Reviewed Doctor Visits Reviewed  Annabell Sabal upcoming provider visits. Advised to keep follow up visits with providers as recommended.]  Exercise Interventions   Exercise Discussed/Reviewed Physical Activity  [assessed patients current physical activity.]  Education Interventions   Education Provided Provided Education  [Advised to continue monitoring blood pressure at least 2-3 times per week and record notifying providers for readings outside of establshed parameters.]  Pharmacy Interventions   Pharmacy Dicussed/Reviewed Pharmacy Topics Reviewed  [medications reviewed and compliance discussed]  Safety Interventions   Safety Discussed/Reviewed Fall Risk  [Assessed for falls. Fall prevention discussed. Advised to use ambulatory devices as recommended by provider.]                 Our next appointment is by telephone on 01/26/24 at 2 pm  Please call the care guide team at [phone] if you need to cancel or reschedule your appointment.   If you are experiencing a Mental Health or Behavioral Health Crisis or need someone to talk to, please call the Suicide  and Crisis Lifeline: 988 call 1-800-273-TALK (toll free, 24 hour hotline)  The patient verbalized understanding of instructions, educational materials, and care plan provided today and DECLINED offer to receive copy of patient instructions, educational materials, and care plan.   George Ina RN, BSN, CCM [employer], Population Health Case Manager Phone: [phone]

## 2023-12-17 NOTE — Patient Outreach (Signed)
 Care Coordination   Follow Up Visit Note   12/17/2023 Name: [name] MRN: [id number] DOB: April 03, 1941  [name] is a 83 y.o. year old female who sees Tower, Audrie Gallus, MD for primary care. I spoke with  [name] by phone today.  What matters to the patients health and wellness today?  Patient states pneumonia symptoms have subsided. She reports feeling better.  Patient states her husband checks her blood pressures. She reports blood pressure readings are 120130's/50-60's.   Patient reports her RA is pretty debilitating. She states she uses her walker/ wheelchair for ambulation.  Patient denies any recent falls.  Patient reports next follow up appointment with her  Rheumatologist 12/2023.   Goals Addressed             This Visit's Progress    Management and education of chronic health conditions.       Interventions Today    Flowsheet Row Most Recent Value  Chronic Disease   Chronic disease during today's visit Hypertension (HTN), Other  [(rheumatoid arthritis, falls, status post pneumonia)]  General Interventions   General Interventions Discussed/Reviewed General Interventions Reviewed, Doctor Visits  [evaluation of current treatment plan for listed health conditions and patients adherence to plan as established by provider. Assessed for ongoing pneumonia and RA symptoms. Assessed for BP readings.]  Doctor Visits Discussed/Reviewed Doctor Visits Reviewed  [name] upcoming provider visits. Advised to keep follow up visits with providers as recommended.]  Exercise Interventions   Exercise Discussed/Reviewed Physical Activity  [assessed patients current physical activity.]  Education Interventions   Education Provided Provided Education  [Advised to continue monitoring blood pressure at least 2-3 times per week and record notifying providers for readings outside of establshed parameters.]  Pharmacy Interventions   Pharmacy Dicussed/Reviewed Pharmacy Topics Reviewed   [medications reviewed and compliance discussed]  Safety Interventions   Safety Discussed/Reviewed Fall Risk  [Assessed for falls. Fall prevention discussed. Advised to use ambulatory devices as recommended by provider.]                 SDOH assessments and interventions completed:  Yes  SDOH Interventions Today    Flowsheet Row Most Recent Value  SDOH Interventions   Food Insecurity Interventions Intervention Not Indicated  Housing Interventions Intervention Not Indicated  Transportation Interventions Intervention Not Indicated  Utilities Interventions Intervention Not Indicated        Care Coordination Interventions:  No, not indicated   Follow up plan: Follow up call scheduled for 01/26/24 at 2 pm    Encounter Outcome:  Patient Visit Completed  Amy Ina RN, BSN, CCM [place]  [facility], Population Health Case Manager Phone: [phone]

## 2023-12-23 ENCOUNTER — Other Ambulatory Visit: Payer: Self-pay | Admitting: Family Medicine

## 2023-12-23 DIAGNOSIS — E876 Hypokalemia: Secondary | ICD-10-CM

## 2024-01-12 DIAGNOSIS — M79641 Pain in right hand: Secondary | ICD-10-CM | POA: Diagnosis not present

## 2024-01-12 DIAGNOSIS — M25562 Pain in left knee: Secondary | ICD-10-CM | POA: Diagnosis not present

## 2024-01-12 DIAGNOSIS — M0579 Rheumatoid arthritis with rheumatoid factor of multiple sites without organ or systems involvement: Secondary | ICD-10-CM | POA: Diagnosis not present

## 2024-01-12 DIAGNOSIS — M25561 Pain in right knee: Secondary | ICD-10-CM | POA: Diagnosis not present

## 2024-01-12 DIAGNOSIS — M1991 Primary osteoarthritis, unspecified site: Secondary | ICD-10-CM | POA: Diagnosis not present

## 2024-01-12 DIAGNOSIS — M79642 Pain in left hand: Secondary | ICD-10-CM | POA: Diagnosis not present

## 2024-01-12 DIAGNOSIS — M5136 Other intervertebral disc degeneration, lumbar region with discogenic back pain only: Secondary | ICD-10-CM | POA: Diagnosis not present

## 2024-01-15 ENCOUNTER — Ambulatory Visit: Payer: Self-pay | Admitting: Family Medicine

## 2024-01-15 MED ORDER — TRIAMCINOLONE ACETONIDE 0.1 % EX CREA: 1.0000 | TOPICAL_CREAM | Freq: Two times a day (BID) | CUTANEOUS | 0 refills | Status: AC | PRN

## 2024-01-15 NOTE — Telephone Encounter (Signed)
 I sent the cream  Follow up if not improved

## 2024-01-15 NOTE — Telephone Encounter (Signed)
 She had a rash last spring on posterior neck and shoulders-we used triamcinolone on it   It sounds like this rash is just on one side and painful- that is more worrisome for shingles   Please confirm with her /one side or both sides/ what area ? Thanks

## 2024-01-15 NOTE — Telephone Encounter (Signed)
 Pt said the rash and pain was on both sides at 1st but spouse put on triamcinolone cream on rash from last year and it helped with the itching and pain a lot, now the right side is just a little painful and the left side doesn't hurt at all. Pt said the cream did work that's why she was asking for it to be refilled

## 2024-01-15 NOTE — Telephone Encounter (Signed)
Pt notified Rx sent to pharmacy and advised of PCP's comments

## 2024-01-15 NOTE — Telephone Encounter (Signed)
 Rash on right shoulder since Wednesday  Symptoms: burns intermittently, itches (not bad), small red stripes  Pertinent Negatives: Patient denies hives, bump, bleeding, fever, difficulty breathing or swallowing  Disposition: /[x] Urgent Care (no appt availability in office)   Additional Notes: Pt is not sure what is causing it. This RN recommends pt goes to urgent care today. Pt requests Dr. Milinda Antis prescribes a cream to put on rash. This RN explained that pt needs to be seen in person but a message will be sent to office.    Copied from CRM [id number]. Topic: Clinical - Red Word Triage >> Jan 15, 2024  2:26 PM Melissa C wrote: Red Word that prompted transfer to Nurse Triage: patient got a small rash that appeared Wednesday and it has gotten worse/ hasn't gone away. Stated it has a sting to it. She doesn't know what to use on it. Reason for Disposition  [1] Localized rash is very painful AND [2] no fever  Answer Assessment - Initial Assessment Questions 1. APPEARANCE of RASH: "Describe the rash."      *No Answer* 2. LOCATION: "Where is the rash located?"      *No Answer* 3. NUMBER: "How many spots are there?"      *No Answer* 4. SIZE: "How big are the spots?" (Inches, centimeters or compare to size of a coin)      *No Answer* 5. ONSET: "When did the rash start?"      *No Answer* 6. ITCHING: "Does the rash itch?" If Yes, ask: "How bad is the itch?"  (Scale 0-10; or none, mild, moderate, severe)     *No Answer* 7. PAIN: "Does the rash hurt?" If Yes, ask: "How bad is the pain?"  (Scale 0-10; or none, mild, moderate, severe)    - NONE (0): no pain    - MILD (1-3): doesn't interfere with normal activities     - MODERATE (4-7): interferes with normal activities or awakens from sleep     - SEVERE (8-10): excruciating pain, unable to do any normal activities     *No Answer* 8. OTHER SYMPTOMS: "Do you have any other symptoms?" (e.g., fever)     *No Answer* 9. PREGNANCY: "Is there any chance you  are pregnant?" "When was your last menstrual period?"     *No Answer*  Protocols used: Rash or Redness - Localized-A-AH

## 2024-01-26 ENCOUNTER — Other Ambulatory Visit: Payer: Self-pay

## 2024-01-26 ENCOUNTER — Ambulatory Visit: Payer: Self-pay

## 2024-01-26 NOTE — Patient Outreach (Signed)
 Complex Care Management   Visit Note  01/26/2024  Name:  [name] MRN: [id number] DOB: 07-07-1941  Situation: Referral received for Complex Care Management related to  RA/ falls/ Medication management  I obtained verbal consent from patient.  Visit completed with patient  on the phone Patient reports ongoing management and treatment for RA.  Patient reports sustaining fall approximately 1 week ago hitting right hip and right arm. Denies serious injury. Reports soreness to areas are getting better and states her husband was there to assist her.  She reports sustaining several falls within the past year. She reports having PT in the past. States she ambulates with walker / wheelchair and/ or cane. She also reports having stand by assist from husband when needed.   Patient states she does not take all of her medications at times because they make her dizzy.  Patient verbally agreed to follow up with practice pharmacist.  Background:   Past Medical History:  Diagnosis Date   Allergic rhinitis    Cataract 2015   bilateral   COVID-19 10/2019   ASYMPTOMATIC   Diabetes mellitus without complication (HCC)    Diverticulosis    Esophageal reflux    Essential hypertension 01/07/2016   Fatty liver 2018   Fibula fracture 02/2005   Former smoker    Gastritis 2003   Heart murmur    MILD, NO CARDIOLOGIST   Hemorrhoids    History of cardiac dysrhythmia 2011   ABLATION  AT [facility]   History of kidney stones YRS AGO   Hyperglycemia    Hyperlipemia    Osteoarthritis    Personal history of colonic polyps 1998   hyperplastic   PMB (postmenopausal bleeding)    Pre-diabetes    BORDERLINE DIET CONTROLLED DOES NOT CHECK CBG   RA (rheumatoid arthritis) (HCC)    Rectal bleed 2018   TRANSFUSION GIVEN   Shingles 2012   Uterine fibroid     Assessment: Patient Reported Symptoms:  Cognitive Alert and oriented to person, place, and time  Neurological No symptoms reported    HEENT Runny nose,  Frequent sneezing, Other: (coughing) patient states she has seasonal allergies and takes claritin and OTC cough syrup when need. Patient states this seems to help sympotms.  Cardiovascular No symptoms reported    Respiratory Dry cough, Other: (sneezing/ runny nose)    Endocrine Weight loss    Gastrointestinal No symptoms reported    Genitourinary No symptoms reported    Integumentary No symptoms reported    Musculoskeletal Difficulty walking, Unsteady gait    Psychosocial No symptoms reported     Vitals:   01/26/24 1529  BP: (!) 130/50    Medications Reviewed Today     Reviewed by Otho Ket, RN (Registered Nurse) on 01/26/24 at 1501  Med List Status: <None>   Medication Order Taking? Sig Documenting Provider Last Dose Status Informant  Accu-Chek Softclix Lancets lancets [id number]  Check glucose level daily and as needed for diabetes type 2  (lancets for the soft clicks lancing device AccuChek meter) Tower, Audrie Gallus, MD  Active   acetaminophen (TYLENOL) 325 MG tablet [id number] Yes Take 650 mg by mouth every 6 (six) hours as needed. [provider] Taking Active Self  albuterol (PROVENTIL HFA;VENTOLIN HFA) 108 (90 Base) MCG/ACT inhaler [id number] Yes Inhale 2 puffs into the lungs every 4 (four) hours as needed for wheezing or shortness of breath. Please give spacer if possible Tower, Audrie Gallus, MD Taking Active Self  amLODipine (NORVASC)  5 MG tablet [id number] Yes Take 1 tablet (5 mg total) by mouth daily. Tower, Audrie Gallus, MD Taking Active   atorvastatin (LIPITOR) 10 MG tablet [id number] Yes Take 0.5 tablets (5 mg total) by mouth every other day. Tower, Audrie Gallus, MD Taking Active   blood glucose meter kit and supplies [id number]  To check glucose daily and prn for DM2 E11.9 Tower, Audrie Gallus, MD  Active   chlorthalidone (HYGROTON) 25 MG tablet [id number] Yes Take 1 tablet (25 mg total) by mouth daily. Tower, Audrie Gallus, MD Taking Active   cyanocobalamin (VITAMIN B12) 500 MCG tablet  [id number] Yes Take 500 mcg by mouth daily. [provider] Taking Active   dicyclomine (BENTYL) 10 MG/5ML solution [id number] Yes Take 1-2 mLs (2-4 mg total) by mouth 4 (four) times daily -  before meals and at bedtime. Prev with blurry vision on higher dose but not an allergy and may be able to tolerate lower dose. Tower, Audrie Gallus, MD Taking Active   docusate sodium (COLACE) 100 MG capsule [id number] Yes Take 1 capsule (100 mg total) by mouth 2 (two) times daily. Osvaldo Shipper, MD Taking Active Self  EPINEPHrine [facility] IJ) [id number] Yes Inject as directed as needed. Reported on 12/05/2015 [provider] Taking Active Self  fluticasone (FLONASE) 50 MCG/ACT nasal spray [id number] Yes PLACE 2 SPRAYS INTO THE NOSE DAILY. Tower, Audrie Gallus, MD Taking Active            Med Note Adelina Mings Oct 19, 2023  1:26 PM) Patient states she takes as needed.   folic acid (FOLVITE) 1 MG tablet [id number] Yes Take 1 mg by mouth daily. [provider] Taking Active Self  glipiZIDE (GLUCOTROL XL) 2.5 MG 24 hr tablet [id number] Yes Take 1 tablet (2.5 mg total) by mouth daily with breakfast. Tower, Audrie Gallus, MD Taking Active   glucose blood (ACCU-CHEK GUIDE) test strip [id number]  USE TO CHECK BLOOD SUGAR DAILY Tower, Audrie Gallus, MD  Active   hydroxychloroquine (PLAQUENIL) 200 MG tablet [id number] Yes Take 200 mg by mouth 2 (two) times daily. [provider] Taking Active   loratadine (CLARITIN) 10 MG tablet [id number] Yes Take 10 mg by mouth daily. MAY TAKE AT [facility] ALSO [provider] Taking Active Self  methotrexate (RHEUMATREX) 2.5 MG tablet [id number] Yes Take 15 mg by mouth once a week. Caution:Chemotherapy. Protect from light. ( Tuesday of each week ) [provider] Taking Active Self  ondansetron (ZOFRAN) 4 MG tablet [id number] Yes Take 1 tablet (4 mg total) by mouth every 8 (eight) hours as needed for nausea or vomiting. Caution of sedation Tower, Audrie Gallus, MD Taking  Active   pantoprazole (PROTONIX) 40 MG tablet [id number] Yes Take 1 tablet (40 mg total) by mouth daily. Tower, Audrie Gallus, MD Taking Active   polyethylene glycol [facility] / GLYCOLAX) packet [id number] Yes Take 17 g by mouth daily. [provider] Taking Active Self  potassium chloride (KLOR-CON M) 10 MEQ tablet [id number] Yes TAKE 1 TABLET BY MOUTH TWICE A DAY Tower, Audrie Gallus, MD Taking Active   predniSONE (DELTASONE) 5 MG tablet [id number] Yes Take 5 mg by mouth daily. [provider] Taking Active Self  triamcinolone cream (KENALOG) 0.1 % [id number] Yes Apply 1 Application topically 2 (two) times daily as needed. On rash/affected area Tower, Audrie Gallus, MD Taking Active             Recommendation:   Referral to practice pharmacist for medication management  due to reported side effects. Ongoing follow up with RNCM for education, support and assistance with managing health conditions.  Fall prevention discussed and advised    Follow Up Plan:   Telephone follow up appointment date/time:  03/03/24 at 2 pm  George Ina RN, BSN, CCM [place]  [facility], Population Health Case Manager Phone: [phone]

## 2024-01-26 NOTE — Patient Instructions (Signed)
 Visit Information  Thank you for taking time to visit with me today. Please don't hesitate to contact me if I can be of assistance to you before our next scheduled appointment.  Our next appointment is by telephone on 03/03/24 at 2pm Please call the care guide team at [phone] if you need to cancel or reschedule your appointment.   Following is a copy of your care plan:   Goals Addressed             This Visit's Progress    COMPLETED: Management and education of chronic health conditions.       Interventions Today    Flowsheet Row Most Recent Value  Chronic Disease   Chronic disease during today's visit Hypertension (HTN), Other  [(rheumatoid arthritis, falls, status post pneumonia)]  General Interventions   General Interventions Discussed/Reviewed General Interventions Reviewed, Doctor Visits  [evaluation of current treatment plan for listed health conditions and patients adherence to plan as established by provider. Assessed for ongoing pneumonia and RA symptoms. Assessed for BP readings.]  Doctor Visits Discussed/Reviewed Doctor Visits Reviewed  Annabell Sabal upcoming provider visits. Advised to keep follow up visits with providers as recommended.]  Exercise Interventions   Exercise Discussed/Reviewed Physical Activity  [assessed patients current physical activity.]  Education Interventions   Education Provided Provided Education  [Advised to continue monitoring blood pressure at least 2-3 times per week and record notifying providers for readings outside of establshed parameters.]  Pharmacy Interventions   Pharmacy Dicussed/Reviewed Pharmacy Topics Reviewed  [medications reviewed and compliance discussed]  Safety Interventions   Safety Discussed/Reviewed Fall Risk  [Assessed for falls. Fall prevention discussed. Advised to use ambulatory devices as recommended by provider.]              VBCI RN Care PlanRheumatoid arthritis/ falls/ medication management       Problems:   Chronic Disease Management support and education needs related to rheumatoid arthritis/ falls/ medication side effect concerns.   Goal: Over the next 2 months the Patient will attend all scheduled medical appointments: with providers as evidenced by patient report and/ or chart review.         demonstrate Ongoing adherence to prescribed treatment plan for RA/ falls as evidenced by patient report and/ or chart review.  take all medications exactly as prescribed and will call provider for medication related questions as evidenced by patient report.     work with pharmacist to address medication side effects related to poly pharmacy as evidenced by review of EMR and patient or pharmacist report    Understanding of fall prevention as evidenced by patient report Decrease in falls as evidenced by patient report  Interventions:   Falls Interventions:  (Status:  Goal on track:  Yes.) Long Term Goal Reviewed medications and discussed potential side effects of medications such as dizziness and frequent urination Advised patient of importance of notifying provider of falls Assessed for falls since last encounter Assessed patients knowledge of fall risk prevention secondary to previously provided education Referred patient to pharmacist for medication side effect concerns.  Advised to take medications as prescribed for RA treatment / management   Patient Self-Care Activities:  Attend all scheduled provider appointments Call provider office for new concerns or questions  Take medications as prescribed   Work with the pharmacist to address medication management needs and will continue to work with the clinical team to address health care and disease management related needs Use ambulatory devices as recommended by provider. Report any falls to  provider as soon as possible Have someone with you to assist when in bathroom/ bathing   Plan:  Telephone follow up appointment with care management team  member scheduled for:  03/03/24 at 2 pm             Please call the Suicide and Crisis Lifeline: 988 call 1-800-273-TALK (toll free, 24 hour hotline) if you are experiencing a Mental Health or Behavioral Health Crisis or need someone to talk to.  Patient verbalizes understanding of instructions and care plan provided today and agrees to view in MyChart. Active MyChart status and patient understanding of how to access instructions and care plan via MyChart confirmed with patient.     George Ina RN, BSN, CCM [employer], Population Health Case Manager Phone: [phone]

## 2024-01-31 ENCOUNTER — Other Ambulatory Visit: Payer: Self-pay | Admitting: Family Medicine

## 2024-02-02 ENCOUNTER — Ambulatory Visit (INDEPENDENT_AMBULATORY_CARE_PROVIDER_SITE_OTHER)
Admission: RE | Admit: 2024-02-02 | Discharge: 2024-02-02 | Disposition: A | Discharge status: Home / Self Care | Source: Ambulatory Visit | Attending: Family Medicine | Admitting: Family Medicine

## 2024-02-02 ENCOUNTER — Encounter: Payer: Self-pay | Admitting: Family Medicine

## 2024-02-02 ENCOUNTER — Ambulatory Visit (INDEPENDENT_AMBULATORY_CARE_PROVIDER_SITE_OTHER): Admitting: Family Medicine

## 2024-02-02 VITALS — BP 145/70 | HR 64 | Temp 98.3°F | Ht 67.0 in | Wt 154.4 lb

## 2024-02-02 DIAGNOSIS — M47816 Spondylosis without myelopathy or radiculopathy, lumbar region: Secondary | ICD-10-CM | POA: Diagnosis not present

## 2024-02-02 DIAGNOSIS — M25561 Pain in right knee: Secondary | ICD-10-CM

## 2024-02-02 DIAGNOSIS — I10 Essential (primary) hypertension: Secondary | ICD-10-CM | POA: Diagnosis not present

## 2024-02-02 DIAGNOSIS — M25551 Pain in right hip: Secondary | ICD-10-CM | POA: Diagnosis not present

## 2024-02-02 DIAGNOSIS — S79911A Unspecified injury of right hip, initial encounter: Secondary | ICD-10-CM | POA: Diagnosis not present

## 2024-02-02 DIAGNOSIS — M533 Sacrococcygeal disorders, not elsewhere classified: Secondary | ICD-10-CM

## 2024-02-02 DIAGNOSIS — Y92009 Unspecified place in unspecified non-institutional (private) residence as the place of occurrence of the external cause: Secondary | ICD-10-CM | POA: Diagnosis not present

## 2024-02-02 DIAGNOSIS — M160 Bilateral primary osteoarthritis of hip: Secondary | ICD-10-CM | POA: Diagnosis not present

## 2024-02-02 DIAGNOSIS — W19XXXA Unspecified fall, initial encounter: Secondary | ICD-10-CM | POA: Diagnosis not present

## 2024-02-02 DIAGNOSIS — S8991XA Unspecified injury of right lower leg, initial encounter: Secondary | ICD-10-CM

## 2024-02-02 DIAGNOSIS — M1711 Unilateral primary osteoarthritis, right knee: Secondary | ICD-10-CM | POA: Diagnosis not present

## 2024-02-02 DIAGNOSIS — M25461 Effusion, right knee: Secondary | ICD-10-CM | POA: Diagnosis not present

## 2024-02-02 NOTE — Assessment & Plan Note (Addendum)
 After fall upon right side yesterday  Tender to palpation  Xray ordered -no fracture   Encouraged to offload sitting and use cold compress if able

## 2024-02-02 NOTE — Patient Instructions (Signed)
 Xray of hip/pelvis knee and tail bone area today  We will reach out with results when they are read, and then make a plan   Use ice on affected areas   Use your walker at all times when up and around

## 2024-02-02 NOTE — Assessment & Plan Note (Addendum)
 Fall yesterday onto right side/ hip/ knee   Limited rom of hip  Also pain in pelvis -lateral and anterior   Imaging ordered -no hip or pelvis fractures  Arthritic changes of hips noted  Has known OA and RA as well  Encouraged use of ice/cold compress Follow up with rheumatology early if needed

## 2024-02-02 NOTE — Assessment & Plan Note (Addendum)
 Poor balance and chronic pain  Per pt her right leg "went out on her" due to pain and weakness both  Fell onto right side    Pain - hip/pelvis/coccyx and knee  Imaging ordered-no fractures  High risk for fracture due to chronic steroids  Is able to bear weight with walker

## 2024-02-02 NOTE — Assessment & Plan Note (Signed)
 BP: (!) 145/70  Up due to pain most likely  Continues Chlorthalidone 25 mg daily  Amlodipine 5 mg daily   Encouraged to re check at home

## 2024-02-02 NOTE — Assessment & Plan Note (Addendum)
 After fall on right side yesterday  In setting of OA and RA   Medial joint line tenderness and swelling  Entire knee is tender but wore medially  Limited rom due to pain  Can bear weight with walker   Imaging ordered : no fracture , tricompartmental arthritis and effusion is noted  Encouraged to use cold compress / elevated Follow up with rheumatology early if needed

## 2024-02-02 NOTE — Progress Notes (Signed)
 Subjective:    Patient ID: [name], female    DOB: [birth year], 83 y.o.   MRN: [id number]  HPI  Wt Readings from Last 3 Encounters:  02/02/24 154 lb 6 oz (70 kg)  12/02/23 163 lb 8 oz (74.2 kg)  11/17/23 165 lb (74.8 kg)   24.18 kg/m  Vitals:   02/02/24 1050 02/02/24 1117  BP: (!) 156/84 (!) 145/70  Pulse: 64   Temp: 98.3 F (36.8 C)   SpO2: 98%     Pt presents for pain after a fall   Yesterday fell  Hit right hip and leg    Yesterday she was walking to the bathroom  Her right leg "went out" on her - and she hit the floor  Hip hit the floor  Twisted her knee   Husband helped her get up / quickly / did not lie on floor for long  Did not use ice Did lie down   Usually use a cane Changed to walker since yesterday     Hip pain is on the outside  Her right buttock hurts   Knee hurts -on inside  No groin pain      RA Methotrexate Prednisone 5 mg daily     Patient Active Problem List   Diagnosis Date Noted   Injury of right hip 02/02/2024   Right knee injury 02/02/2024   Coccyx pain 02/02/2024   Fall at home, initial encounter 02/02/2024   Estrogen deficiency 12/02/2023   Depressed mood 09/05/2023   Hypokalemia 09/04/2023   Dizziness 08/26/2023   Decreased appetite 08/26/2023   History of UTI 08/26/2023   Atypical pneumonia 08/26/2023   Heart murmur 08/27/2022   Mobility impaired 07/22/2022   Current use of proton pump inhibitor 10/01/2021   General weakness 10/01/2021   Poor balance 10/01/2021   On prednisone therapy 10/01/2021   Diabetes mellitus treated with oral medication (HCC) 11/13/2020   Hyperlipidemia associated with type 2 diabetes mellitus (HCC) 10/15/2020   Fatigue 01/30/2020   Encounter for screening mammogram for breast cancer 10/27/2017   Routine general medical examination at a health care facility 10/27/2017   Aortic atherosclerosis (HCC) 02/03/2017   RA (rheumatoid arthritis) (HCC)    Adjustment disorder with  mixed anxiety and depressed mood 06/24/2016   Essential hypertension 01/07/2016   Coronary atherosclerosis 11/07/2015   Osteoarthritis 05/10/2014   GERD 07/09/2007   Past Medical History:  Diagnosis Date   Allergic rhinitis    Cataract 2015   bilateral   COVID-19 10/2019   ASYMPTOMATIC   Diabetes mellitus without complication (HCC)    Diverticulosis    Esophageal reflux    Essential hypertension 01/07/2016   Fatty liver 2018   Fibula fracture 02/2005   Former smoker    Gastritis 2003   Heart murmur    MILD, NO CARDIOLOGIST   Hemorrhoids    History of cardiac dysrhythmia 2011   ABLATION  AT [facility]   History of kidney stones YRS AGO   Hyperglycemia    Hyperlipemia    Osteoarthritis    Personal history of colonic polyps 1998   hyperplastic   PMB (postmenopausal bleeding)    Pre-diabetes    BORDERLINE DIET CONTROLLED DOES NOT CHECK CBG   RA (rheumatoid arthritis) (HCC)    Rectal bleed 2018   TRANSFUSION GIVEN   Shingles 2012   Uterine fibroid    Past Surgical History:  Procedure Laterality Date   CHOLECYSTECTOMY  YRS AGO   COLONOSCOPY  11/08  internal hemorrhoids   DILATATION & CURETTAGE/HYSTEROSCOPY WITH MYOSURE N/A 07/06/2020   Procedure: DILATATION & CURETTAGE/HYSTEROSCOPY;  Surgeon: Theresia Majors, MD;  Location: [facility];  Service: Gynecology;  Laterality: N/A;  request 7:30am OR time in [place] Gyn block requests 30 minutes   EP procedure  1994   SVT; normal echo   ESOPHAGOGASTRODUODENOSCOPY  11/03   reactive gastropathy   EYE SURGERY Bilateral 2017   CATARACTS   LIPOMA EXCISION  11/2002   SVT s/p surgery--? ablation  2011   AT [facility]   TUBAL LIGATION  YRS AGO   vaginal polyp excised  12/2000   benign   Social History   Tobacco Use   Smoking status: Former    Current packs/day: 0.00    Average packs/day: 0.8 packs/day for 15.0 years (11.3 ttl pk-yrs)    Types: Cigarettes    Start date: 10/20/1988    Quit date: 10/21/2003     Years since quitting: 20.2   Smokeless tobacco: Never  Vaping Use   Vaping status: Never Used  Substance Use Topics   Alcohol use: No    Alcohol/week: 0.0 standard drinks of alcohol   Drug use: No   Family History  Problem Relation Age of Onset   Colon cancer Father 63   Diabetes Mother    Heart disease Mother    Kidney disease Mother        ESRD   Stroke Mother    Hypertension Brother    Breast cancer Sister 30   Diabetes Other        nephews   Colon cancer Sister    Esophageal cancer Neg Hx    Stomach cancer Neg Hx    Rectal cancer Neg Hx    Allergies  Allergen Reactions   Bee Venom Anaphylaxis   Dicyclomine     Blurry vision   Humira [Adalimumab]     LIPS SWELLED AND RASH   Metformin And Related     rash   Current Outpatient Medications on File Prior to Visit  Medication Sig Dispense Refill   ACCU-CHEK GUIDE TEST test strip USE TO CHECK BLOOD SUGAR DAILY 100 strip 1   Accu-Chek Softclix Lancets lancets Check glucose level daily and as needed for diabetes type 2  (lancets for the soft clicks lancing device AccuChek meter) 100 each 3   acetaminophen (TYLENOL) 325 MG tablet Take 650 mg by mouth every 6 (six) hours as needed.     albuterol (PROVENTIL HFA;VENTOLIN HFA) 108 (90 Base) MCG/ACT inhaler Inhale 2 puffs into the lungs every 4 (four) hours as needed for wheezing or shortness of breath. Please give spacer if possible 1 Inhaler 3   amLODipine (NORVASC) 5 MG tablet Take 1 tablet (5 mg total) by mouth daily. 90 tablet 3   atorvastatin (LIPITOR) 10 MG tablet Take 0.5 tablets (5 mg total) by mouth every other day. 22 tablet 3   blood glucose meter kit and supplies To check glucose daily and prn for DM2 E11.9 1 each 0   chlorthalidone (HYGROTON) 25 MG tablet Take 1 tablet (25 mg total) by mouth daily. 90 tablet 3   cyanocobalamin (VITAMIN B12) 500 MCG tablet Take 500 mcg by mouth daily.     dicyclomine (BENTYL) 10 MG/5ML solution Take 1-2 mLs (2-4 mg total) by mouth 4  (four) times daily -  before meals and at bedtime. Prev with blurry vision on higher dose but not an allergy and may be able to tolerate lower dose.  100 mL 3   docusate sodium (COLACE) 100 MG capsule Take 1 capsule (100 mg total) by mouth 2 (two) times daily. 60 capsule 0   EPINEPHrine (EPIPEN IJ) Inject as directed as needed. Reported on 12/05/2015     fluticasone (FLONASE) 50 MCG/ACT nasal spray PLACE 2 SPRAYS INTO THE NOSE DAILY. 48 g 3   folic acid (FOLVITE) 1 MG tablet Take 1 mg by mouth daily.     glipiZIDE (GLUCOTROL XL) 2.5 MG 24 hr tablet Take 1 tablet (2.5 mg total) by mouth daily with breakfast. 90 tablet 3   hydroxychloroquine (PLAQUENIL) 200 MG tablet Take 200 mg by mouth 2 (two) times daily.     loratadine (CLARITIN) 10 MG tablet Take 10 mg by mouth daily. MAY TAKE AT [facility] ALSO     methotrexate (RHEUMATREX) 2.5 MG tablet Take 15 mg by mouth once a week. Caution:Chemotherapy. Protect from light. ( Tuesday of each week )     ondansetron (ZOFRAN) 4 MG tablet Take 1 tablet (4 mg total) by mouth every 8 (eight) hours as needed for nausea or vomiting. Caution of sedation 20 tablet 0   pantoprazole (PROTONIX) 40 MG tablet Take 1 tablet (40 mg total) by mouth daily. 90 tablet 0   polyethylene glycol (MIRALAX / GLYCOLAX) packet Take 17 g by mouth daily.     potassium chloride (KLOR-CON M) 10 MEQ tablet TAKE 1 TABLET BY MOUTH TWICE A DAY 180 tablet 1   predniSONE (DELTASONE) 5 MG tablet Take 5 mg by mouth daily.     triamcinolone cream (KENALOG) 0.1 % Apply 1 Application topically 2 (two) times daily as needed. On rash/affected area 15 g 0   No current facility-administered medications on file prior to visit.    Review of Systems     Objective:   Physical Exam Constitutional:      Appearance: Normal appearance. She is normal weight. She is not ill-appearing.  Eyes:     Conjunctiva/sclera: Conjunctivae normal.     Pupils: Pupils are equal, round, and reactive to light.  Cardiovascular:      Rate and Rhythm: Normal rate and regular rhythm.  Pulmonary:     Effort: Pulmonary effort is normal. No respiratory distress.     Breath sounds: No wheezing or rales.  Musculoskeletal:     Right lower leg: No edema.     Left lower leg: No edema.     Comments: Sitting in wheelchair / can walk with walker   Tender over sacrum/coccyx midline- no swelling or deformity Tender over right lateral hip /greater trochanter and in right anterior pelvis -no deformity Limited rom of hip - int or ext rotation   Knee , right  Exam sitting Medial swelling /no obv effusion  No warmth to the touch  Mild crepitus (baseline)  ROM:  Flex-limited to 20 deg with pain  Ext -full Mcmurray-positive for pain all over Bounce test -positive for pain all over   Stability: Anterior drawer-nl   Tenderness -tender in all areas of knee but worst over medial joint line   Gait -wheelchair/not observed   Per pt using walker and pain to bear weight on RLE       Skin:    Coloration: Skin is not jaundiced.     Findings: No bruising or erythema.  Neurological:     Mental Status: She is alert.     Deep Tendon Reflexes: Reflexes normal.  Psychiatric:     Comments: Mildly anxious  Assessment & Plan:   Problem List Items Addressed This Visit       Cardiovascular and Mediastinum   Essential hypertension   BP: (!) 145/70  Up due to pain most likely  Continues Chlorthalidone 25 mg daily  Amlodipine 5 mg daily   Encouraged to re check at home         Other   Right knee injury   After fall on right side yesterday  In setting of OA and RA   Medial joint line tenderness and swelling  Entire knee is tender but wore medially  Limited rom due to pain  Can bear weight with walker   Imaging ordered : no fracture , tricompartmental arthritis and effusion is noted  Encouraged to use cold compress / elevated Follow up with rheumatology early if needed         Relevant Orders    DG Knee Complete 4 Views Right (Completed)   Injury of right hip   Fall yesterday onto right side/ hip/ knee   Limited rom of hip  Also pain in pelvis -lateral and anterior   Imaging ordered -no hip or pelvis fractures  Arthritic changes of hips noted  Has known OA and RA as well  Encouraged use of ice/cold compress Follow up with rheumatology early if needed       Relevant Orders   DG HIP UNILAT WITH PELVIS 2-3 VIEWS RIGHT (Completed)   Fall at home, initial encounter - Primary   Poor balance and chronic pain  Per pt her right leg "went out on her" due to pain and weakness both  Fell onto right side    Pain - hip/pelvis/coccyx and knee  Imaging ordered-no fractures  High risk for fracture due to chronic steroids  Is able to bear weight with walker       Coccyx pain   After fall upon right side yesterday  Tender to palpation  Xray ordered -no fracture   Encouraged to offload sitting and use cold compress if able       Relevant Orders   DG Sacrum/Coccyx (Completed)

## 2024-02-04 ENCOUNTER — Other Ambulatory Visit: Payer: Self-pay | Admitting: Family Medicine

## 2024-03-03 ENCOUNTER — Other Ambulatory Visit: Payer: Self-pay

## 2024-03-03 NOTE — Patient Outreach (Signed)
 Complex Care Management   Visit Note  03/05/2024  Name:  [name] MRN: [id number] DOB: Jun 04, 1941  Situation: Referral received for Complex Care Management related to RA / falls I obtained verbal consent from Patient.  Visit completed with patient  on the phone  Background:   Past Medical History:  Diagnosis Date   Allergic rhinitis    Cataract 2015   bilateral   COVID-19 10/2019   ASYMPTOMATIC   Diabetes mellitus without complication (HCC)    Diverticulosis    Esophageal reflux    Essential hypertension 01/07/2016   Fatty liver 2018   Fibula fracture 02/2005   Former smoker    Gastritis 2003   Heart murmur    MILD, NO CARDIOLOGIST   Hemorrhoids    History of cardiac dysrhythmia 2011   ABLATION  AT [facility]   History of kidney stones YRS AGO   Hyperglycemia    Hyperlipemia    Osteoarthritis    Personal history of colonic polyps 1998   hyperplastic   PMB (postmenopausal bleeding)    Pre-diabetes    BORDERLINE DIET CONTROLLED DOES NOT CHECK CBG   RA (rheumatoid arthritis) (HCC)    Rectal bleed 2018   TRANSFUSION GIVEN   Shingles 2012   Uterine fibroid     Assessment: Patient Reported Symptoms:  Cognitive Cognitive Status: Alert and oriented to person, place, and time, Insightful and able to interpret abstract concepts, Normal speech and language skills Cognitive/Intellectual Conditions Management [RPT]: None reported or documented in medical history or problem list   Health Maintenance Behaviors: Annual physical exam Healing Pattern: Average  Neurological Neurological Review of Symptoms: No symptoms reported    HEENT HEENT Symptoms Reported: Other: Other HEENT Symptoms/Conditions: patient states she occasionally has difficulty with swallowing certain foods however she tries to avoid the foods that cause her issues. She states she doesn't have much of an appetite. HEENT Management Strategies: Routine screening    Cardiovascular Cardiovascular Symptoms  Reported: No symptoms reported Does patient have uncontrolled Hypertension?: No Cardiovascular Conditions: Hypertension Cardiovascular Management Strategies: Routine screening, Medication therapy Weight: 153 lb (69.4 kg) Cardiovascular Self-Management Outcome: 4 (good)  Respiratory Respiratory Symptoms Reported: Dry cough, Other: Other Respiratory Symptoms: sneezing Respiratory Conditions: Seasonal allergies Respiratory Comment: patient states she takes claritin  for allergies.  Endocrine Patient reports the following symptoms related to hypoglycemia or hyperglycemia : No symptoms reported Is patient diabetic?: Yes Is patient checking blood sugars at home?: Yes Endocrine Conditions: Diabetes Endocrine Management Strategies: Routine screening, Medication therapy, Diet modification Endocrine Self-Management Outcome: 4 (good) Endocrine Comment: Patient states her blood sugars range 110-118 fasting. Per chart review last hgb A1c 6.1  Gastrointestinal Gastrointestinal Symptoms Reported: Change in appetite, Constipation Additional Gastrointestinal Details: patient states she has occasional constipation. She states she is on miralax  to manage this. Gastrointestinal Conditions: Constipation Gastrointestinal Management Strategies: Medication therapy Gastrointestinal Self-Management Outcome: 3 (uncertain) Gastrointestinal Comment: Patient states she is very concerned about her lack of appetite. She reports losing approximately 40 lbs since the middle to end of last year. Reports drinking one diabetic protein shake per day. Nutrition Risk Screen (CP): Reduced oral intake over the last month  Genitourinary Genitourinary Symptoms Reported: No symptoms reported    Integumentary Integumentary Symptoms Reported: No symptoms reported    Musculoskeletal Musculoskelatal Symptoms Reviewed: Unsteady gait, Difficulty walking, Weakness Musculoskeletal Conditions: Rheumatoid arthritis, Unsteady gait,  Osteoarthritis Musculoskeletal Management Strategies: Routine screening, Medical device Musculoskeletal Comment: patient states uses walker, cane, or wheelchair for ambulation Falls in the past  year?: Yes Number of falls in past year: 2 or more Was there an injury with Fall?: No Fall Risk Category Calculator: 2 Patient Fall Risk Level: Moderate Fall Risk Patient at Risk for Falls Due to: History of fall(s), Impaired balance/gait, Impaired mobility Fall risk Follow up: Falls prevention discussed  Psychosocial Psychosocial Symptoms Reported: No symptoms reported     Quality of Family Relationships: supportive Do you feel physically threatened by others?: No      03/03/2024   11:30 PM  Depression screen PHQ 2/9  Decreased Interest 0  Down, Depressed, Hopeless 0  PHQ - 2 Score 0    Vitals:   03/03/24 1428  BP: 134/69    Medications Reviewed Today     Reviewed by Eliyohu Class E, RN (Registered Nurse) on 03/03/24 at 1425  Med List Status: <None>   Medication Order Taking? Sig Documenting Provider Last Dose Status Informant  ACCU-CHEK GUIDE TEST test strip [id number] No USE TO CHECK BLOOD SUGAR DAILY Tower, Manley Seeds, MD Taking Active   Accu-Chek Softclix Lancets lancets [id number] No Check glucose level daily and as needed for diabetes type 2  (lancets for the soft clicks lancing device AccuChek meter) Tower, Manley Seeds, MD Taking Active   acetaminophen  (TYLENOL ) 325 MG tablet [id number] No Take 650 mg by mouth every 6 (six) hours as needed. [provider] Taking Active Self  albuterol  (PROVENTIL  HFA;VENTOLIN  HFA) 108 (90 Base) MCG/ACT inhaler [id number] No Inhale 2 puffs into the lungs every 4 (four) hours as needed for wheezing or shortness of breath. Please give spacer if possible Tower, Manley Seeds, MD Taking Active Self  amLODipine  (NORVASC ) 5 MG tablet [id number] No Take 1 tablet (5 mg total) by mouth daily. Tower, Manley Seeds, MD Taking Active   atorvastatin  (LIPITOR) 10 MG tablet  [id number]  TAKE 1/2 TABLET BY MOUTH EVERY OTHER DAY Tower, Manley Seeds, MD  Active   blood glucose meter kit and supplies [id number] No To check glucose daily and prn for DM2 E11.9 Tower, Manley Seeds, MD Taking Active   chlorthalidone  (HYGROTON ) 25 MG tablet [id number] No Take 1 tablet (25 mg total) by mouth daily. Tower, Manley Seeds, MD Taking Active   cyanocobalamin  (VITAMIN B12) 500 MCG tablet [id number] No Take 500 mcg by mouth daily. [provider] Taking Active   dicyclomine  (BENTYL ) 10 MG/5ML solution [id number] No Take 1-2 mLs (2-4 mg total) by mouth 4 (four) times daily -  before meals and at bedtime. Prev with blurry vision on higher dose but not an allergy and may be able to tolerate lower dose. Tower, Manley Seeds, MD Taking Active   docusate sodium  (COLACE) 100 MG capsule [id number] No Take 1 capsule (100 mg total) by mouth 2 (two) times daily. Maylene Spear, MD Taking Active Self  EPINEPHrine [facility] IJ) [id number] No Inject as directed as needed. Reported on 12/05/2015 [provider] Taking Active Self  fluticasone  (FLONASE ) 50 MCG/ACT nasal spray [id number] No PLACE 2 SPRAYS INTO THE NOSE DAILY. Tower, Manley Seeds, MD Taking Active            Med Note Evans Him Oct 19, 2023  1:26 PM) Patient states she takes as needed.   folic acid  (FOLVITE ) 1 MG tablet [id number] No Take 1 mg by mouth daily. [provider] Taking Active Self  glipiZIDE  (GLUCOTROL  XL) 2.5 MG 24 hr tablet [id number] No Take 1 tablet (2.5 mg total) by mouth daily with breakfast. Tower, Manley Seeds, MD Taking Active  hydroxychloroquine (PLAQUENIL) 200 MG tablet [id number] No Take 200 mg by mouth 2 (two) times daily. [provider] Taking Active   loratadine  (CLARITIN ) 10 MG tablet [id number] No Take 10 mg by mouth daily. MAY TAKE AT [facility] ALSO [provider] Taking Active Self  methotrexate (RHEUMATREX) 2.5 MG tablet [id number] No Take 15 mg by mouth once a week. Caution:Chemotherapy. Protect from  light. ( Tuesday of each week ) [provider] Taking Active Self  ondansetron  (ZOFRAN ) 4 MG tablet [id number] No Take 1 tablet (4 mg total) by mouth every 8 (eight) hours as needed for nausea or vomiting. Caution of sedation Tower, Manley Seeds, MD Taking Active   pantoprazole  (PROTONIX ) 40 MG tablet [id number] No Take 1 tablet (40 mg total) by mouth daily. Tower, Manley Seeds, MD Taking Active   polyethylene glycol (MIRALAX  / GLYCOLAX ) packet [id number] No Take 17 g by mouth daily. [provider] Taking Active Self  potassium chloride  (KLOR-CON  M) 10 MEQ tablet [id number] No TAKE 1 TABLET BY MOUTH TWICE A DAY Tower, Marne A, MD Taking Active   predniSONE  (DELTASONE ) 5 MG tablet [id number] No Take 5 mg by mouth daily. [provider] Taking Active Self  triamcinolone  cream (KENALOG ) 0.1 % [id number] No Apply 1 Application topically 2 (two) times daily as needed. On rash/affected area Tower, Manley Seeds, MD Taking Active             Recommendation:   PCP Follow-up  Follow Up Plan:   Telephone follow-up in 1 month with RN case manager  Verba Girt RN, BSN, CCM [place]  [facility], Population Health Case Manager Phone: [phone]

## 2024-03-05 NOTE — Patient Instructions (Signed)
 Visit Information  Thank you for taking time to visit with me today. Please don't hesitate to contact me if I can be of assistance to you before our next scheduled appointment.  Your next care management appointment is by telephone on 03/29/24 at 11 am  Telephone follow-up in 1 month with RN care manager  Please call the care guide team at [phone] if you need to cancel, schedule, or reschedule an appointment.   Please call the Suicide and Crisis Lifeline: 988 call 1-800-273-TALK (toll free, 24 hour hotline) if you are experiencing a Mental Health or Behavioral Health Crisis or need someone to talk to.  Verba Girt RN, BSN, CCM [employer], Population Health Case Manager Phone: [phone]

## 2024-03-16 DIAGNOSIS — M0579 Rheumatoid arthritis with rheumatoid factor of multiple sites without organ or systems involvement: Secondary | ICD-10-CM | POA: Diagnosis not present

## 2024-03-23 ENCOUNTER — Other Ambulatory Visit (INDEPENDENT_AMBULATORY_CARE_PROVIDER_SITE_OTHER): Payer: Medicare Other

## 2024-03-23 ENCOUNTER — Ambulatory Visit: Payer: Self-pay | Admitting: Family Medicine

## 2024-03-23 ENCOUNTER — Other Ambulatory Visit: Payer: Medicare Other

## 2024-03-23 DIAGNOSIS — E876 Hypokalemia: Secondary | ICD-10-CM | POA: Diagnosis not present

## 2024-03-23 LAB — BASIC METABOLIC PANEL WITH GFR
BUN: 9 mg/dL (ref 6–23)
CO2: 28 meq/L (ref 19–32)
Calcium: 9.2 mg/dL (ref 8.4–10.5)
Chloride: 103 meq/L (ref 96–112)
Creatinine, Ser: 0.63 mg/dL (ref 0.40–1.20)
GFR: 82.93 mL/min (ref >60.00)
Glucose, Bld: 86 mg/dL (ref 70–99)
Potassium: 3.7 meq/L (ref 3.5–5.1)
Sodium: 139 meq/L (ref 135–145)

## 2024-03-29 ENCOUNTER — Other Ambulatory Visit: Payer: Self-pay

## 2024-03-29 VITALS — Wt 153.0 lb

## 2024-03-29 DIAGNOSIS — R630 Anorexia: Secondary | ICD-10-CM

## 2024-03-29 NOTE — Patient Instructions (Addendum)
 Visit Information  Thank you for taking time to visit with me today. Please don't hesitate to contact me if I can be of assistance to you before our next scheduled appointment.  Your next care management appointment is by telephone on 04/29/24 at 11 am  Telephone follow-up in 1 month with Michele Ahle, RN case manager  Please call the care guide team at [phone] if you need to cancel, schedule, or reschedule an appointment.   Please call the Suicide and Crisis Lifeline: 988 call 1-800-273-TALK (toll free, 24 hour hotline) if you are experiencing a Mental Health or Behavioral Health Crisis or need someone to talk to.  Keonna Raether RN, BSN, CCM [place]  [facility], Population Health Case Manager Phone: [phone]    Visit Information  Thank you for taking time to visit with me today. Please don't hesitate to contact me if I can be of assistance to you before our next scheduled appointment.  Your next care management appointment is by telephone on 04/29/24 at 11 am  Telephone follow-up in 1 month with Michele Ahle, RN case manager.   Please call the care guide team at [phone] if you need to cancel, schedule, or reschedule an appointment.   Please call the Suicide and Crisis Lifeline: 988 call 1-800-273-TALK (toll free, 24 hour hotline) if you are experiencing a Mental Health or Behavioral Health Crisis or need someone to talk to.  Verba Girt RN, BSN, CCM [employer], Population Health Case Manager Phone: [phone]

## 2024-03-29 NOTE — Patient Outreach (Signed)
 Complex Care Management   Visit Note  03/29/2024  Name:  [name] MRN: [id number] DOB: 1940/10/21  Situation: Referral received for Complex Care Management related to RA I obtained verbal consent from Patient.  Visit completed with patient  on the phone  Background:   Past Medical History:  Diagnosis Date   Allergic rhinitis    Cataract 2015   bilateral   COVID-19 10/2019   ASYMPTOMATIC   Diabetes mellitus without complication (HCC)    Diverticulosis    Esophageal reflux    Essential hypertension 01/07/2016   Fatty liver 2018   Fibula fracture 02/2005   Former smoker    Gastritis 2003   Heart murmur    MILD, NO CARDIOLOGIST   Hemorrhoids    History of cardiac dysrhythmia 2011   ABLATION  AT [facility]   History of kidney stones YRS AGO   Hyperglycemia    Hyperlipemia    Osteoarthritis    Personal history of colonic polyps 1998   hyperplastic   PMB (postmenopausal bleeding)    Pre-diabetes    BORDERLINE DIET CONTROLLED DOES NOT CHECK CBG   RA (rheumatoid arthritis) (HCC)    Rectal bleed 2018   TRANSFUSION GIVEN   Shingles 2012   Uterine fibroid     Assessment: Patient Reported Symptoms:  Cognitive Cognitive Status: Alert and oriented to person, place, and time, Insightful and able to interpret abstract concepts, Normal speech and language skills      Neurological Neurological Review of Symptoms: No symptoms reported    HEENT HEENT Symptoms Reported: No symptoms reported      Cardiovascular Does patient have uncontrolled Hypertension?: No Cardiovascular Conditions: Hypertension (coronary atheriosclerosis) Cardiovascular Management Strategies: Routine screening, Medication therapy, Diet modification Weight: 153 lb (69.4 kg) Cardiovascular Self-Management Outcome: 4 (good)  Respiratory Respiratory Symptoms Reported: No symptoms reported    Endocrine Patient reports the following symptoms related to hypoglycemia or hyperglycemia : No symptoms  reported Is patient diabetic?: Yes Is patient checking blood sugars at home?: No Endocrine Conditions: Diabetes Endocrine Management Strategies: Routine screening, Medication therapy, Diet modification Endocrine Self-Management Outcome: 4 (good)  Gastrointestinal Gastrointestinal Symptoms Reported: Change in appetite Additional Gastrointestinal Details: patient reports change in appetite has occurred over the last several months.  She states her appetite comes and goes.  she reports drinking 1-2 nutritional drinks daily.  Patient states she has discussed this with her primary care provider so she is aware. Gastrointestinal Management Strategies: Diet modification (nutritional supplement) Nutrition Risk Screen (CP): Unintentional loss of 10 lbs or more in the past 2 months  Genitourinary Genitourinary Symptoms Reported: No symptoms reported    Integumentary Integumentary Symptoms Reported: No symptoms reported    Musculoskeletal Musculoskelatal Symptoms Reviewed: Difficulty walking, Unsteady gait, Weakness Musculoskeletal Conditions: Rheumatoid arthritis Musculoskeletal Management Strategies: Routine screening, Medication therapy, Medical device Musculoskeletal Comment: Patient states she is receiving IV infusion from her rheumatologist for her RA.  She states she is scheduled for her second infusion on 03/30/24. Patient reports pain level today is an 8 with pain being mainly in her knees and legs. Per chart review patient next primary care provider follow up is 06/03/24.      Psychosocial Psychosocial Symptoms Reported: No symptoms reported            03/03/2024   11:30 PM  Depression screen PHQ 2/9  Decreased Interest 0  Down, Depressed, Hopeless 0  PHQ - 2 Score 0    There were no vitals filed for this visit.  Medications Reviewed Today     Reviewed by Jerimy Johanson E, RN (Registered Nurse) on 03/29/24 at 1131  Med List Status: <None>   Medication Order Taking? Sig Documenting  Provider Last Dose Status Informant  ACCU-CHEK GUIDE TEST test strip [id number]  USE TO CHECK BLOOD SUGAR DAILY Tower, Manley Seeds, MD  Active   Accu-Chek Softclix Lancets lancets [id number]  Check glucose level daily and as needed for diabetes type 2  (lancets for the soft clicks lancing device AccuChek meter) Tower, Manley Seeds, MD  Active   acetaminophen  (TYLENOL ) 325 MG tablet [id number] Yes Take 650 mg by mouth every 6 (six) hours as needed. [provider] Taking Active Self  albuterol  (PROVENTIL  HFA;VENTOLIN  HFA) 108 (90 Base) MCG/ACT inhaler [id number] Yes Inhale 2 puffs into the lungs every 4 (four) hours as needed for wheezing or shortness of breath. Please give spacer if possible Tower, Manley Seeds, MD Taking Active Self  amLODipine  (NORVASC ) 5 MG tablet [id number] Yes Take 1 tablet (5 mg total) by mouth daily. Tower, Manley Seeds, MD Taking Active   atorvastatin  (LIPITOR) 10 MG tablet [id number] No TAKE 1/2 TABLET BY MOUTH EVERY OTHER DAY  Patient not taking: Reported on 03/29/2024   Clemens Curt, MD Not Taking Active   blood glucose meter kit and supplies [id number]  To check glucose daily and prn for DM2 E11.9 Tower, Manley Seeds, MD  Active   chlorthalidone  (HYGROTON ) 25 MG tablet [id number] Yes Take 1 tablet (25 mg total) by mouth daily. Tower, Manley Seeds, MD Taking Active   cyanocobalamin  (VITAMIN B12) 500 MCG tablet [id number] Yes Take 500 mcg by mouth daily. [provider] Taking Active   dicyclomine  (BENTYL ) 10 MG/5ML solution [id number] Yes Take 1-2 mLs (2-4 mg total) by mouth 4 (four) times daily -  before meals and at bedtime. Prev with blurry vision on higher dose but not an allergy and may be able to tolerate lower dose. Tower, Manley Seeds, MD Taking Active   docusate sodium  (COLACE) 100 MG capsule [id number] Yes Take 1 capsule (100 mg total) by mouth 2 (two) times daily. Maylene Spear, MD Taking Active Self  EPINEPHrine [facility] IJ) [id number] Yes Inject as directed as needed. Reported on  12/05/2015 [provider] Taking Active Self  fluticasone  (FLONASE ) 50 MCG/ACT nasal spray [id number] Yes PLACE 2 SPRAYS INTO THE NOSE DAILY. Tower, Manley Seeds, MD Taking Active            Med Note Marrie Sizer, Garek Schuneman E   Tue Mar 29, 2024 11:29 AM) Patient states she takes as needed.   folic acid  (FOLVITE ) 1 MG tablet [id number] Yes Take 1 mg by mouth daily. [provider] Taking Active Self  glipiZIDE  (GLUCOTROL  XL) 2.5 MG 24 hr tablet [id number] Yes Take 1 tablet (2.5 mg total) by mouth daily with breakfast. Tower, Manley Seeds, MD Taking Active   hydroxychloroquine (PLAQUENIL) 200 MG tablet [id number] Yes Take 200 mg by mouth 2 (two) times daily. [provider] Taking Active   loratadine  (CLARITIN ) 10 MG tablet [id number] Yes Take 10 mg by mouth daily. MAY TAKE AT [facility] ALSO [provider] Taking Active Self  methotrexate (RHEUMATREX) 2.5 MG tablet [id number] Yes Take 15 mg by mouth once a week. Caution:Chemotherapy. Protect from light. ( Tuesday of each week ) [provider] Taking Active Self  ondansetron  (ZOFRAN ) 4 MG tablet [id number] Yes Take 1 tablet (4 mg total) by mouth every 8 (eight) hours as needed for nausea or vomiting. Caution of sedation  Tower, Manley Seeds, MD Taking Active   pantoprazole  (PROTONIX ) 40 MG tablet [id number] Yes Take 1 tablet (40 mg total) by mouth daily. Tower, Manley Seeds, MD Taking Active   polyethylene glycol (MIRALAX  / GLYCOLAX ) packet [id number] Yes Take 17 g by mouth daily. [provider] Taking Active Self  potassium chloride  (KLOR-CON  M) 10 MEQ tablet [id number] Yes TAKE 1 TABLET BY MOUTH TWICE A DAY Tower, Manley Seeds, MD Taking Active   predniSONE  (DELTASONE ) 5 MG tablet [id number] Yes Take 5 mg by mouth daily. [provider] Taking Active Self  triamcinolone  cream (KENALOG ) 0.1 % [id number] Yes Apply 1 Application topically 2 (two) times daily as needed. On rash/affected area Tower, Manley Seeds, MD Taking Active              Recommendation:   PCP Follow-up  Follow Up Plan:   Telephone follow-up in 1 month. Transferring to [facility] RN for ongoing case management follow up.  Patient verbally agreed to transfer and ongoing follow up.   Verba Girt RN, BSN, CCM [employer], Population Health Case Manager Phone: [phone]

## 2024-04-14 ENCOUNTER — Telehealth: Payer: Self-pay

## 2024-04-14 NOTE — Progress Notes (Signed)
 Complex Care Management Note  Care Guide Note 04/14/2024 Name: RAELYNNE LUDWICK MRN: [id number] DOB: 02-08-1941  [name] is a 83 y.o. year old female who sees Tower, Laine LABOR, MD for primary care. I reached out to [name] by phone today to offer complex care management services.  Ms. Streetman was given information about Complex Care Management services today including:   The Complex Care Management services include support from the care team which includes your Nurse Care Manager, Clinical Social Worker, or Pharmacist.  The Complex Care Management team is here to help remove barriers to the health concerns and goals most important to you. Complex Care Management services are voluntary, and the patient may decline or stop services at any time by request to their care team member.   Complex Care Management Consent Status: Patient agreed to services and verbal consent obtained.   Follow up plan:  Telephone appointment with complex care management team member scheduled for:  04/20/24 at 2:00 p.m.   Encounter Outcome:  Patient Scheduled  Dreama [facility]  [facility], [facility] Care Management Assistant Direct Dial: [phone]  Fax: [phone]

## 2024-04-19 DIAGNOSIS — M1991 Primary osteoarthritis, unspecified site: Secondary | ICD-10-CM | POA: Diagnosis not present

## 2024-04-19 DIAGNOSIS — M5136 Other intervertebral disc degeneration, lumbar region with discogenic back pain only: Secondary | ICD-10-CM | POA: Diagnosis not present

## 2024-04-19 DIAGNOSIS — M25562 Pain in left knee: Secondary | ICD-10-CM | POA: Diagnosis not present

## 2024-04-19 DIAGNOSIS — M25561 Pain in right knee: Secondary | ICD-10-CM | POA: Diagnosis not present

## 2024-04-19 DIAGNOSIS — M0579 Rheumatoid arthritis with rheumatoid factor of multiple sites without organ or systems involvement: Secondary | ICD-10-CM | POA: Diagnosis not present

## 2024-04-19 DIAGNOSIS — Z6824 Body mass index (BMI) 24.0-24.9, adult: Secondary | ICD-10-CM | POA: Diagnosis not present

## 2024-04-20 ENCOUNTER — Other Ambulatory Visit: Admitting: Pharmacist

## 2024-04-20 DIAGNOSIS — R634 Abnormal weight loss: Secondary | ICD-10-CM

## 2024-04-20 NOTE — Progress Notes (Signed)
 04/25/2024 Name: [name] MRN: [id number] DOB: [birth year]  Subjective  Chief Complaint  Patient presents with   Weight Loss   Medication Reconcilliation   Care Team: Primary Care Provider: Randeen Laine LABOR, MD  Reason for visit: ?  [name] is a 83 y.o. female who presents today for a telephone visit with the pharmacist due to medication concerns. Patient concerned that her appetite has decreased and she has been losing weight over the last several months. She feels this may be due to her medications. Please contact RN case manager you have questions.  ? Cc: Variable/reduced appetite  Reports change in appetite which has occurred over the last several months. She states her appetite comes and goes. Some days does not eat much, other days may have a couple of meals. Reports drinking 1-2 nutritional drinks daily (Boost). Notes her husband keeps her on track, encourages her to eat.   Patient does not have specific concerns that this change is possibly related to some of her medications though feels as though she is prescribed a lot of medications overall and wonders if this could be a factor.    Current medications: Patient unable to confirm specific names/doses of medications. Pharmacy refill hx reviewed.  Endo: Glipizide  (filling) Cardio/Vasc:  (not filling) KCl (filling) Amlodipine  (filling) Chlorthalidone  (filling, refill due soon) GI: Miralax  (OTC) Ondansetron  (no recent fill hx) Pantoprazole  (filling) Docusate (OTC) Dicyclomine  (Bentyl ) (no recent fill hx) Pulm: Albuterol   Rheum: Hydroxychloroquine (<1% incidence weight loss) Methotrexate / Folic acid  (<10% incidence weight loss) (filling)  New Medications: - Remicaide q8w (01/12/24). Patient notes not starting. Continues MTX/Hydroxychloroquine. States she was told other medications may not be good for her heart.   Medication/Lifestyle Concerns related to appetite/weight loss: No - Alcohol: Denies use -  Stimulants, amphetamines: No  - Tobacco use: Denies use - Thyroid  medication: No  - Swallowing problems, other oral factors (tooth loss, xerostomia): Denies concerns - GI: Reports GI concerns are stable. Is having regular bowel movements. Has stool softener/Miralax  as needed. No nausea/abd pain.   Wt Readings from Last 20 Encounters:  03/29/24 153 lb (69.4 kg)  03/03/24 153 lb (69.4 kg)  02/02/24 154 lb 6 oz (70 kg)  12/02/23 163 lb 8 oz (74.2 kg)  11/17/23 165 lb (74.8 kg)  09/04/23 165 lb 6 oz (75 kg)  08/26/23 165 lb 6 oz (75 kg)  06/12/23 182 lb 8 oz (82.8 kg)  05/27/23 184 lb (83.5 kg)  05/12/23 189 lb (85.7 kg)  03/13/23 192 lb 4 oz (87.2 kg)  03/02/23 198 lb (89.8 kg)  02/11/23 202 lb (91.6 kg)  02/06/23 203 lb (92.1 kg)  01/28/23 206 lb (93.4 kg)  01/06/23 205 lb 4 oz (93.1 kg)  11/14/22 200 lb (90.7 kg)  08/27/22 200 lb (90.7 kg)  07/22/22 206 lb (93.4 kg)  04/01/22 199 lb 6 oz (90.4 kg)    Assessment and Plan:   1. Appetite reduction Patient-reported appetite reduction with c/f unintentional weight loss over the last 2 months. Notably, weight trend reveals stable weight since April though with significant weight decline steadily over the past 12 months with consistent documentation of cause from not eating.   Reviewed comprehensive medication list today with regard to medication that may impact appetite/weight. No red-flag medications on her medication list at this time that would significantly affect weight/appetite. Diabetes medications may cause weight gain, though on low dose of glipizide .   Patient cannot recall any other changes or symptoms  aside from appetite.  Notes 150 lb at [facility] f/u (-3 lb) though different clinic/different scale. Continue to monitor weight periodically. Has remained stable the past few months. Focus on receiving adequate nutrition. Reviewed substances to avoid as well at goals/strategies for increasing calories/nutrition.  Avoid alcohol,  tobacco, caffeine (not currently used) Encouraged ongoing protein shakes if not eating.  Smaller more frequent meals/snacks if low appetite.   Labs reviewed: Thyroid : TSH WNL 11/2023 Lytes: WNL 03/2024 Renal: Excellent, GFR >60. UACR ~11 mg/g  Lipid: TG slightly elevated, consistent w historical. LFTs WNL 09/2023 A1c: 6.1%, no c/f hyperglycemia   Wt Readings from Last 3 Encounters:  03/29/24 153 lb (69.4 kg)  03/03/24 153 lb (69.4 kg)  02/02/24 154 lb 6 oz (70 kg)     Future Appointments  Date Time Provider Department Center  04/29/2024 11:00 AM Devra Lands, RN [facility] None  06/03/2024  2:00 PM Tower, Laine LABOR, MD [facility]  11/17/2024  3:00 PM [facility] ANNUAL WELLNESS VISIT 1 [facility]    [name], PharmD Clinical Pharmacist [facility] [phone]

## 2024-04-29 ENCOUNTER — Other Ambulatory Visit: Payer: Self-pay

## 2024-04-29 DIAGNOSIS — Z79899 Other long term (current) drug therapy: Secondary | ICD-10-CM

## 2024-04-29 NOTE — Addendum Note (Signed)
 Addended by: Arti Trang on: 04/29/2024 12:19 PM   Modules accepted: Orders

## 2024-04-29 NOTE — Patient Instructions (Signed)
 Visit Information  Thank you for taking time to visit with me today. Please don't hesitate to contact me if I can be of assistance to you before our next scheduled appointment.  Your next care management appointment is by telephone on 05/27/2024 at 11:00 am  Telephone follow-up in 1 month  Please call the care guide team at [phone] if you need to cancel, schedule, or reschedule an appointment.   Please call the Suicide and Crisis Lifeline: 988 call the [place]  National Suicide Prevention Lifeline: 780-815-3480 or TTY: 947-114-3354 TTY 269 521 7484) to talk to a trained counselor call 1-800-273-TALK (toll free, 24 hour hotline) go to [facility] [address] [place] [phone]) call 911 if you are experiencing a Mental Health or Behavioral Health Crisis or need someone to talk to.  Nestora Duos, MSN, RN [place]  [facility], [facility] RN Care Manager Direct Dial: [phone] Fax: [phone]  Fall Prevention in the Home, Adult Falls can cause injuries and can happen to people of all ages. There are many things you can do to make your home safer and to help prevent falls. What actions can I take to prevent falls? General information Use good lighting in all rooms. Make sure to: Replace any light bulbs that burn out. Turn on the lights in dark areas and use night-lights. Keep items that you use often in easy-to-reach places. Lower the shelves around your home if needed. Move furniture so that there are clear paths around it. Do not use throw rugs or other things on the floor that can make you trip. If any of your floors are uneven, fix them. Add color or contrast paint or tape to clearly mark and help you see: Grab bars or handrails. First and last steps of staircases. Where the edge of each step is. If you use a ladder or stepladder: Make sure that it is fully opened. Do not climb a closed  ladder. Make sure the sides of the ladder are locked in place. Have someone hold the ladder while you use it. Know where your pets are as you move through your home. What can I do in the bathroom?     Keep the floor dry. Clean up any water on the floor right away. Remove soap buildup in the bathtub or shower. Buildup makes bathtubs and showers slippery. Use non-skid mats or decals on the floor of the bathtub or shower. Attach bath mats securely with double-sided, non-slip rug tape. If you need to sit down in the shower, use a non-slip stool. Install grab bars by the toilet and in the bathtub and shower. Do not use towel bars as grab bars. What can I do in the bedroom? Make sure that you have a light by your bed that is easy to reach. Do not use any sheets or blankets on your bed that hang to the floor. Have a firm chair or bench with side arms that you can use for support when you get dressed. What can I do in the kitchen? Clean up any spills right away. If you need to reach something above you, use a step stool with a grab bar. Keep electrical cords out of the way. Do not use floor polish or wax that makes floors slippery. What can I do with my stairs? Do not leave anything on the stairs. Make sure that you have a light switch at the top and the bottom of the stairs. Make sure that there are handrails  on both sides of the stairs. Fix handrails that are broken or loose. Install non-slip stair treads on all your stairs if they do not have carpet. Avoid having throw rugs at the top or bottom of the stairs. Choose a carpet that does not hide the edge of the steps on the stairs. Make sure that the carpet is firmly attached to the stairs. Fix carpet that is loose or worn. What can I do on the outside of my home? Use bright outdoor lighting. Fix the edges of walkways and driveways and fix any cracks. Clear paths of anything that can make you trip, such as tools or rocks. Add color or  contrast paint or tape to clearly mark and help you see anything that might make you trip as you walk through a door, such as a raised step or threshold. Trim any bushes or trees on paths to your home. Check to see if handrails are loose or broken and that both sides of all steps have handrails. Install guardrails along the edges of any raised decks and porches. Have leaves, snow, or ice cleared regularly. Use sand, salt, or ice melter on paths if you live where there is ice and snow during the winter. Clean up any spills in your garage right away. This includes grease or oil spills. What other actions can I take? Review your medicines with your doctor. Some medicines can cause dizziness or changes in blood pressure, which increase your risk of falling. Wear shoes that: Have a low heel. Do not wear high heels. Have rubber bottoms and are closed at the toe. Feel good on your feet and fit well. Use tools that help you move around if needed. These include: Canes. Walkers. Scooters. Crutches. Ask your doctor what else you can do to help prevent falls. This may include seeing a physical therapist to learn to do exercises to move better and get stronger. Where to find more information Centers for Disease Control and Prevention, [employer]: TonerPromos.no [employer] on Aging: BaseRingTones.pl National Institute on Aging: BaseRingTones.pl Contact a doctor if: You are afraid of falling at home. You feel weak, drowsy, or dizzy at home. You fall at home. Get help right away if you: Lose consciousness or have trouble moving after a fall. Have a fall that causes a head injury. These symptoms may be an emergency. Get help right away. Call 911. Do not wait to see if the symptoms will go away. Do not drive yourself to the hospital. This information is not intended to replace advice given to you by your health care provider. Make sure you discuss any questions you have with your health care provider. Document  Revised: 06/09/2022 Document Reviewed: 06/09/2022 Elsevier Patient Education  2024 Elsevier Inc.  Palliative Care Palliative care can help make your quality of life better if you have a very serious illness. It involves care of your body, mind, and spirit. It's based on what you need and want in this stage of life. In many cases, care may take place in a hospital or long-term care setting. It may include: Help with pain and other symptoms. Family support. Spiritual support. Emotional support. Social support. Palliative care can help bring you comfort and peace of mind. It can be helpful to you and your family during the course of an illness. What is the difference between palliative care and hospice? Palliative care and hospice have similar goals. They both aim to: Help with symptoms. Provide comfort. Make your quality of life better.  Maintain your dignity. They're different in that palliative care can be given: At the same time as other treatments. During any phase of a serious illness, from diagnosis to cure. Hospice care is given when: You're thought to have 6 months or less to live. You no longer can, or want to, try to find a cure for your illness. Who can get palliative care services? Palliative care is given to children and adults who are very ill. It may be offered if: You're not responding well to treatment. You need help with pain. You have side effects from treatment that are hard to manage. You have symptoms from the effects of surgery. You have been diagnosed with a disease that: Is advanced. Will shorten your life. Your health care provider may talk with you about palliative care if they think the support would help you. Your family and friends may also get help to manage stress and other concerns. Who makes up the palliative care team? The care team includes: You and your family. Your providers and specialists. Nurses. A Child psychotherapist, psychologist, or  psychiatrist. Based on your needs, the team may also include: A pain specialist. A hospice specialist. Someone to help with finances or insurance. Religious or spiritual leaders. A case Production designer, theatre/television/film. A grief counselor. How will the palliative care team help me and my family? The team will speak with you and your family about: Your symptoms, such as: Pain. Nausea. Vomiting. Shortness of breath. The need for specialists. Advance directives. These may include: Living wills. Health care proxies. Symptoms that have to do with your mental health, such as: Stress. Depression. This is when you feel sad or hopeless. Anxiety. This is when you feel worried or nervous. Treatment options and how to keep as much as possible of your: Function. Ability to move around. Spiritual wishes, such as: Rituals. Prayer. Things you can do to leave a legacy or make memories. Life and death as a normal process. End-of-life care. The team can also help you talk about: Hard issues. Spiritual concerns. Emotional concerns. Where to find more information [employer] on Aging (NIA): BaseRingTones.pl National Hospice and Palliative Care Organization [facility]): [email or web address]-phillips.[email or web address]/ This information is not intended to replace advice given to you by your health care provider. Make sure you discuss any questions you have with your health care provider. Document Revised: 02/23/2023 Document Reviewed: 02/23/2023 Elsevier Patient Education  2024 Elsevier Inc. Chronic pain is a type of pain that lasts or keeps coming back for at least 3-6 months. You may have headaches, pain in the abdomen, or pain in other areas of the body. Chronic pain may be related to an illness, injury, or a health condition. Sometimes, the cause of chronic pain is not known. Chronic pain can make it hard for you to do daily activities. If it is not treated, chronic pain can lead to anxiety and depression. Treatment depends on the cause of your pain and how  severe it is. You may need to work with a pain specialist to come up with a treatment plan. Many people benefit from two or more types of treatment to control their pain. Follow these instructions at home: Treatment plan Follow your treatment plan as told by your health care provider. This may include: Gentle, regular exercise. Eating a healthy diet that includes foods such as vegetables, fruits, fish, and lean meats. Mental health therapy (cognitive or behavioral therapy) that changes the way you think or act in response to the pain. This may help improve  how you feel. Doing physical therapy exercises to improve movement and strength. Meditation, yoga, acupuncture, or massage therapy. Using the oils from plants in your environment or on your skin (aromatherapy). Other treatments may include: Over-the-counter or prescription medicines. Color, light, or sound therapy. Local electrical stimulation. The electrical pulses help to relieve pain by temporarily stopping the nerve impulses that cause you to feel pain. Injections. These deliver numbing or pain-relieving medicines into the spine or the area of pain.  Medicines Take over-the-counter and prescription medicines only as told by your health care provider. Ask your health care provider if the medicine prescribed to you: Requires you to avoid driving or using machinery. Can cause constipation. You may need to take these actions to prevent or treat constipation: Drink enough fluid to keep your urine pale yellow. Take over-the-counter or prescription medicines. Eat foods that are high in fiber, such as beans, whole grains, and fresh fruits and vegetables. Limit foods that are high in fat and processed sugars, such as fried or sweet foods. Lifestyle  Ask your health care provider whether you should keep a pain diary. Your health care provider will tell you what information to write in the diary. This may include: When you have pain. What the  pain feels like. How medicines and other behaviors or treatments help to reduce the pain. Consider talking with a mental health care provider about how to help manage chronic pain. Consider joining a chronic pain support group. Try to control or lower your stress levels. Talk with your health care provider about ways to do this. General instructions Learn as much as you can about how to manage your chronic pain. Ask your health care provider if an intensive pain rehabilitation program or a chronic pain specialist would be helpful. Check your pain level as told by your health care provider. Ask your health care provider if you should use a pain scale. Contact a health care provider if: Your pain is not controlled with treatment. You have new pain. You have side effects from pain medicine. You feel weak or you have trouble doing your normal activities. You have trouble sleeping or you develop confusion. You lose feeling or have numbness in your body. You lose control of your bowels or bladder. Get help right away if: Your pain suddenly gets much worse. You develop chest pain. You have trouble breathing or shortness of breath. You faint, or another person sees you faint. These symptoms may be an emergency. Get help right away. Call 911. Do not wait to see if the symptoms will go away. Do not drive yourself to the hospital. Also, get help right away if: You have thoughts about hurting yourself or others. Take one of these steps if you feel like you may hurt yourself or others, or have thoughts about taking your own life: Go to your nearest emergency room. Call 911. Call the National Suicide Prevention Lifeline at (904)873-0644 or 988. This is open 24 hours a day. Text the Crisis Text Line at (205) 417-5201. This information is not intended to replace advice given to you by your health care provider. Make sure you discuss any questions you have with your health care provider. Document Revised:  05/28/2022 Document Reviewed: 04/30/2022 Elsevier Patient Education  2024 Elsevier Inc.  Diabetes Action Plan A diabetes action plan is a way for you to manage your symptoms of diabetes, also called diabetes mellitus. The plan is color-coded to guide you on what actions to take based on any symptoms  you're having. If you have symptoms in the red zone, you need medical care right away. If you have symptoms in the yellow zone, your diabetes isn't under control, and you may need to make some changes. If you have symptoms in the green zone, you're doing well. Understanding diabetes can take time. Follow the treatment plan that you created with your health care provider. Know the target range for your blood sugar, also called glucose. Review your plan each time you visit your provider. The target range for my blood sugar level is __________________________ mg/dL. Red zone Get medical help right away if you have any of the following symptoms: A blood sugar test result that's below 54 mg/dL (3 mmol/L). A blood sugar test result that's at or above 240 mg/dL (86.6 mmol/L) for 2 days in a row along with: Extreme thirst and frequent peeing. Confusion or trouble thinking clearly. Moderate or large ketone levels in your pee (urine). Feeling tired or having no energy. Trouble breathing. Sickness or a fever for 2 or more days that's not getting better. These symptoms may be an emergency. Call 911 right away. Do not wait to see if the symptoms will go away. Do not drive yourself to the hospital. If you have very low blood sugar, also called severe hypoglycemia, and you can't eat or drink, you may need glucagon. Make sure a family member or close friend knows how to check your blood sugar and how to give you glucagon. You may need to be treated in a hospital for this condition. Yellow zone If you have any of the following symptoms, your diabetes isn't under control, and you may need to make some changes: A  blood sugar test result that's at or above 240 mg/dL (86.6 mmol/L) for 2 days in a row. Blood sugar test results that are below 70 mg/dL (3.9 mmol/L). Other symptoms of hypoglycemia, such as: Shaking or feeling light-headed. Confusion or irritability. Feeling hungry. Having a fast heartbeat. If you have any yellow zone symptoms: Treat your hypoglycemia by eating or drinking 15 grams of a rapid-acting carbohydrate. Follow the 15:15 rule: Take 15 grams of a rapid-acting carbohydrate, such as: 1 tube of glucose gel. 4 glucose pills. 4 oz (120 mL) of fruit juice. 4 oz (120 mL) of regular (not diet) soda. Check your blood sugar again 15 minutes after you take the carbohydrate. If the second blood sugar test is still at or below 70 mg/dL (3.9 mmol/L), take 15 grams of a carbohydrate again. If your blood sugar doesn't increase above 70 mg/dL (3.9 mmol/L) after 3 tries, get medical help right away. After your blood sugar returns to normal, eat a meal or a snack within 1 hour. Keep taking your daily medicines as told by your provider. Check your blood sugar more often than you normally would. Write down your results. Call your provider if you have trouble keeping your blood sugar in your target range. Green zone These signs mean you're doing well and can continue what you're doing to manage your diabetes: Your blood sugar is within your personal target range. For most people, a blood sugar level before a meal should be 80-130 mg/dL (4.4-7.2 mmol/L). You feel well, and you're able to do daily activities. If you're in the green zone, continue to manage your diabetes as told by your provider. To do this: Eat a healthy diet. Exercise regularly. Check your blood sugar as told. Take your medicines only as told. Where to find more information American Diabetes  Association (ADA): diabetes.org Association of Diabetes Care & Education Specialists (ADCES): adces.org/diabetes-education-dsmes This  information is not intended to replace advice given to you by your health care provider. Make sure you discuss any questions you have with your health care provider. Document Revised: 05/27/2023 Document Reviewed: 05/27/2023 Elsevier Patient Education  2024 [employer].

## 2024-04-29 NOTE — Patient Outreach (Signed)
 Complex Care Management   Visit Note  04/29/2024  Name:  [name] MRN: [id number] DOB: 03-06-1941  Situation: Referral received for Complex Care Management related to RA, Falls, Medication Management I obtained verbal consent from Patient.  Visit completed with Patient  on the phone  Background:   Past Medical History:  Diagnosis Date   Allergic rhinitis    Cataract 2015   bilateral   COVID-19 10/2019   ASYMPTOMATIC   Diabetes mellitus without complication (HCC)    Diverticulosis    Esophageal reflux    Essential hypertension 01/07/2016   Fatty liver 2018   Fibula fracture 02/2005   Former smoker    Gastritis 2003   Heart murmur    MILD, NO CARDIOLOGIST   Hemorrhoids    History of cardiac dysrhythmia 2011   ABLATION  AT [facility]   History of kidney stones YRS AGO   Hyperglycemia    Hyperlipemia    Osteoarthritis    Personal history of colonic polyps 1998   hyperplastic   PMB (postmenopausal bleeding)    Pre-diabetes    BORDERLINE DIET CONTROLLED DOES NOT CHECK CBG   RA (rheumatoid arthritis) (HCC)    Rectal bleed 2018   TRANSFUSION GIVEN   Shingles 2012   Uterine fibroid     Assessment: Patient Reported Symptoms:  Cognitive Cognitive Status: Alert and oriented to person, place, and time, Insightful and able to interpret abstract concepts, Normal speech and language skills, Struggling with memory recall   Health Maintenance Behaviors: Annual physical exam Healing Pattern: Average Health Facilitated by: Pain control, Prayer/meditation, Rest, Healthy diet  Neurological Neurological Review of Symptoms: No symptoms reported Neurological Management Strategies: Adequate rest, Routine screening  HEENT HEENT Symptoms Reported: No symptoms reported HEENT Management Strategies: Routine screening HEENT Comment: Hx throat surgery at times feels dry and scratchy, some swallowing issues, no choking    Cardiovascular Cardiovascular Symptoms Reported: No symptoms  reported Does patient have uncontrolled Hypertension?: No Cardiovascular Management Strategies: Routine screening, Medication therapy, Diet modification  Respiratory Respiratory Symptoms Reported: No symptoms reported    Endocrine Endocrine Symptoms Reported: No symptoms reported Is patient diabetic?: Yes Is patient checking blood sugars at home?: No List most recent blood sugar readings, include date and time of day: Does not check regularly cannot recall last reading Endocrine Self-Management Outcome: 3 (uncertain)  Gastrointestinal Gastrointestinal Symptoms Reported: Change in appetite Additional Gastrointestinal Details: loss of appetitie, eating samll amounts throughout day, using ensure 1-2 day   Nutrition Risk Screen (CP): Reduced oral intake over the last month  Genitourinary Genitourinary Symptoms Reported: No symptoms reported    Integumentary Integumentary Symptoms Reported: No symptoms reported Skin Management Strategies: Routine screening  Musculoskeletal Musculoskelatal Symptoms Reviewed: Difficulty walking, Joint pain, Limited mobility, Muscle pain, Unsteady gait, Weakness, Back pain Additional Musculoskeletal Details: Pain from RA Musculoskeletal Management Strategies: Routine screening, Medication therapy, Medical device Musculoskeletal Comment: No longer receiving infusions due to side effects Falls in the past year?: Yes Number of falls in past year: 2 or more Was there an injury with Fall?: Yes Fall Risk Category Calculator: 3 Patient Fall Risk Level: High Fall Risk Patient at Risk for Falls Due to: History of fall(s), Impaired balance/gait, Impaired mobility Fall risk Follow up: Falls evaluation completed, Falls prevention discussed  Psychosocial Psychosocial Symptoms Reported: No symptoms reported     Quality of Family Relationships: helpful, involved, supportive Do you feel physically threatened by others?: No      04/29/2024   11:43 AM  Depression  screen  PHQ 2/9  Decreased Interest 0  Down, Depressed, Hopeless 0  PHQ - 2 Score 0    There were no vitals filed for this visit.  Medications Reviewed Today     Reviewed by Devra Lands, RN (Registered Nurse) on 04/29/24 at 1128  Med List Status: <None>   Medication Order Taking? Sig Documenting Provider Last Dose Status Informant  ACCU-CHEK GUIDE TEST test strip [id number] Yes USE TO CHECK BLOOD SUGAR DAILY Tower, Laine LABOR, MD  Active   Accu-Chek Softclix Lancets lancets [id number] Yes Check glucose level daily and as needed for diabetes type 2  (lancets for the soft clicks lancing device AccuChek meter) Tower, Laine LABOR, MD  Active   acetaminophen  (TYLENOL ) 325 MG tablet [id number] Yes Take 650 mg by mouth every 6 (six) hours as needed. [provider]  Active Self  albuterol  (PROVENTIL  HFA;VENTOLIN  HFA) 108 (90 Base) MCG/ACT inhaler [id number] Yes Inhale 2 puffs into the lungs every 4 (four) hours as needed for wheezing or shortness of breath. Please give spacer if possible Tower, Laine LABOR, MD  Active Self  amLODipine  (NORVASC ) 5 MG tablet [id number] Yes Take 1 tablet (5 mg total) by mouth daily. Tower, Laine LABOR, MD  Active   atorvastatin  (LIPITOR) 10 MG tablet [id number] Yes TAKE 1/2 TABLET BY MOUTH EVERY OTHER DAY Tower, Laine LABOR, MD  Active   blood glucose meter kit and supplies [id number] Yes To check glucose daily and prn for DM2 E11.9 Tower, Laine LABOR, MD  Active   chlorthalidone  (HYGROTON ) 25 MG tablet [id number] Yes Take 1 tablet (25 mg total) by mouth daily. Tower, Laine LABOR, MD  Active   cyanocobalamin  (VITAMIN B12) 500 MCG tablet [id number] Yes Take 500 mcg by mouth daily. [provider]  Active   dicyclomine  (BENTYL ) 10 MG/5ML solution [id number] Yes Take 1-2 mLs (2-4 mg total) by mouth 4 (four) times daily -  before meals and at bedtime. Prev with blurry vision on higher dose but not an allergy and may be able to tolerate lower dose. Tower, Laine LABOR, MD  Active   docusate sodium   (COLACE) 100 MG capsule [id number] Yes Take 1 capsule (100 mg total) by mouth 2 (two) times daily. Verdene Purchase, MD  Active Self  EPINEPHrine [facility] IJ) [id number] Yes Inject as directed as needed. Reported on 12/05/2015 [provider]  Active Self  fluticasone  (FLONASE ) 50 MCG/ACT nasal spray [id number] Yes PLACE 2 SPRAYS INTO THE NOSE DAILY. Tower, Laine LABOR, MD  Active            Med Note DORI, DAVINA E   Tue Mar 29, 2024 11:29 AM) Patient states she takes as needed.   folic acid  (FOLVITE ) 1 MG tablet [id number] Yes Take 1 mg by mouth daily. [provider]  Active Self  glipiZIDE  (GLUCOTROL  XL) 2.5 MG 24 hr tablet [id number] Yes Take 1 tablet (2.5 mg total) by mouth daily with breakfast. Tower, Laine LABOR, MD  Active   hydroxychloroquine (PLAQUENIL) 200 MG tablet [id number]  Take 200 mg by mouth 2 (two) times daily.  Patient not taking: Reported on 04/29/2024   [provider]  Active   loratadine  (CLARITIN ) 10 MG tablet [id number] Yes Take 10 mg by mouth daily. MAY TAKE AT [facility] ALSO [provider]  Active Self  methotrexate (RHEUMATREX) 2.5 MG tablet [id number] Yes Take 15 mg by mouth once a week. Caution:Chemotherapy. Protect from light. ( Tuesday of each week ) [provider]  Active Self  ondansetron  (ZOFRAN ) 4 MG tablet [id number] Yes Take 1 tablet (4 mg total) by mouth every 8 (eight) hours as needed for nausea or vomiting. Caution of sedation Tower, Laine LABOR, MD  Active   pantoprazole  (PROTONIX ) 40 MG tablet [id number] Yes Take 1 tablet (40 mg total) by mouth daily. Tower, Laine LABOR, MD  Active   polyethylene glycol (MIRALAX  / GLYCOLAX ) packet [id number] Yes Take 17 g by mouth daily.  Patient taking differently: Take 17 g by mouth daily as needed.   [provider]  Active Self  potassium chloride  (KLOR-CON  M) 10 MEQ tablet [id number] Yes TAKE 1 TABLET BY MOUTH TWICE A DAY Tower, Marne A, MD  Active   predniSONE  (DELTASONE ) 5 MG tablet [id number] Yes  Take 5 mg by mouth daily. [provider]  Active Self  triamcinolone  cream (KENALOG ) 0.1 % [id number] Yes Apply 1 Application topically 2 (two) times daily as needed. On rash/affected area Tower, Laine LABOR, MD  Active             Recommendation:   PCP Follow-up Continue Current Plan of Care  Follow Up Plan:   Telephone follow-up in 1 month  Nestora Duos, MSN, RN [facility]  [facility], [facility] RN Care Manager Direct Dial: [phone] Fax: [phone]

## 2024-05-13 ENCOUNTER — Telehealth: Payer: Self-pay

## 2024-05-13 NOTE — Progress Notes (Signed)
 Complex Care Management Note  Care Guide Note 05/13/2024 Name: SABLE KNOLES MRN: [id number] DOB: 02/10/1941  TERISSA HAFFEY is a 83 y.o. year old female who sees Tower, Laine LABOR, MD for primary care. I reached out to [name] by phone today to offer complex care management services.  Ms. Rincon was given information about Complex Care Management services today including:   The Complex Care Management services include support from the care team which includes your Nurse Care Manager, Clinical Social Worker, or Pharmacist.  The Complex Care Management team is here to help remove barriers to the health concerns and goals most important to you. Complex Care Management services are voluntary, and the patient may decline or stop services at any time by request to their care team member.   Complex Care Management Consent Status: Patient agreed to services and verbal consent obtained.   Follow up plan:  Telephone appointment with complex care management team member scheduled for:  05/19/24 at 9:00 a.m.   Encounter Outcome:  Patient Scheduled  [facility]  [facility], [facility] Care Management Assistant Direct Dial: [phone]  Fax: [phone]

## 2024-05-18 NOTE — Progress Notes (Unsigned)
 05/19/2024 Name: [name] MRN: [id number] DOB: April 18, 1941  Subjective  Chief Complaint  Patient presents with   Medication Compliance   Care Team: Primary Care Provider: Randeen, Laine LABOR, MD  Reason for visit: [name] is a 83 y.o. female who presents today for a phone visit with the pharmacist for a medication review/reconciliation visit. PCP would like for patient to get set up for pill packs.   Medication Access/Adherence: ?  Prescription drug coverage: Payor: [employer] / Plan: [employer]HC MEDICARE / Product Type: *No Product type* / .   - Reports that all medications are affordable: Yes  - Medication packaging: Pill bottles (does not use adherence packaging);  - Patient manages their own medications/refills: Yes  (grandson/granddaughter help to fill pill box recently)  HPI: Notes son purchased her a pillbox since last PCP visit. Her grandchildren have been setting up the pillbox for her.   Reports headache one day recently which she attributes to her medications. Not a regular concern for her. Does not check blood pressure at home - no access to BP device.   Outpatient Encounter Medications as of 05/19/2024  Medication Sig Note   acetaminophen  (TYLENOL ) 325 MG tablet Take 650 mg by mouth every 6 (six) hours as needed.    albuterol  (PROVENTIL  HFA;VENTOLIN  HFA) 108 (90 Base) MCG/ACT inhaler Inhale 2 puffs into the lungs every 4 (four) hours as needed for wheezing or shortness of breath. Please give spacer if possible    amLODipine  (NORVASC ) 5 MG tablet Take 1 tablet (5 mg total) by mouth daily.    atorvastatin  (LIPITOR) 10 MG tablet TAKE 1/2 TABLET BY MOUTH EVERY OTHER DAY    chlorthalidone  (HYGROTON ) 25 MG tablet Take 1 tablet (25 mg total) by mouth daily. Refill 1 month overdue - compliance concern   cyanocobalamin  (VITAMIN B12) 500 MCG tablet Take 500 mcg by mouth daily.    folic acid  (FOLVITE ) 1 MG tablet Take 1 mg by mouth daily.    glipiZIDE  (GLUCOTROL   XL) 2.5 MG 24 hr tablet Take 1 tablet (2.5 mg total) by mouth daily with breakfast.    hydroxychloroquine (PLAQUENIL) 200 MG tablet Take 200 mg by mouth 2 (two) times daily.    loratadine  (CLARITIN ) 10 MG tablet Take 10 mg by mouth daily. MAY TAKE AT HS ALSO    methotrexate (RHEUMATREX) 2.5 MG tablet Take 15 mg by mouth once a week. Caution:Chemotherapy. Protect from light. ( Tuesday of each week )    ondansetron  (ZOFRAN ) 4 MG tablet Take 1 tablet (4 mg total) by mouth every 8 (eight) hours as needed for nausea or vomiting. Caution of sedation Prn, has not needed in 2025. Not taking.    pantoprazole  (PROTONIX ) 40 MG tablet Take 1 tablet (40 mg total) by mouth daily.    polyethylene glycol (MIRALAX  / GLYCOLAX ) packet Take 17 g by mouth daily. prn   potassium chloride  (KLOR-CON  M) 10 MEQ tablet TAKE 1 TABLET BY MOUTH TWICE A DAY    triamcinolone  cream (KENALOG ) 0.1 % Apply 1 Application topically 2 (two) times daily as needed. On rash/affected area    ACCU-CHEK GUIDE TEST test strip USE TO CHECK BLOOD SUGAR DAILY    Accu-Chek Softclix Lancets lancets Check glucose level daily and as needed for diabetes type 2  (lancets for the soft clicks lancing device AccuChek meter)    blood glucose meter kit and supplies To check glucose daily and prn for DM2 E11.9    EPINEPHrine (EPIPEN IJ) Inject  as directed as needed. Reported on 12/05/2015    fluticasone  (FLONASE ) 50 MCG/ACT nasal spray PLACE 2 SPRAYS INTO THE NOSE DAILY. .    [DISCONTINUED] dicyclomine  (BENTYL ) 10 MG/5ML solution Take 1-2 mLs (2-4 mg total) by mouth 4 (four) times daily -  before meals and at bedtime. Prev with blurry vision on higher dose but not an allergy and may be able to tolerate lower dose. Last prescribed 2023. Patient denies use.    [DISCONTINUED] docusate sodium  (COLACE) 100 MG capsule Take 1 capsule (100 mg total) by mouth 2 (two) times daily. Denies use this year. Well-controlled with Miralax  prn   [DISCONTINUED] predniSONE   (DELTASONE ) 5 MG tablet Take 5 mg by mouth daily. (Patient not taking: Reported on 05/19/2024) Last prescribed 2024. Denies taking currently.    No facility-administered encounter medications on file as of 05/19/2024.   Assessment and Plan:   Medication Access All medications were reviewed with patient today and medication list was updated as appropriate.  Barriers identified / Adherence concerns: Patient could benefit from compliance packaging as discussed with PCP. Agreeable to transition to [facility] compliance packaging/delivery.  Patient mailed information including pharmacy phone number and refill information.  Contacted [facility] Adherence packaging to get the process started for med transfer from [facility]   Medications that would likely be included in pill pack:  Amlodipine  Chlorthalidone  Glipizide  Pantoprazole  Potassium ?Folic acid   NOT included: OTC medications Atorvastatin  (every other day dosing) Hazardous drugs (methotrexate)  The patients has a diagnosis of hypertension and it is medically necessary for them to have access to a home device to monitor blood pressure.  The patient does not have readily available insurance access to a device and cannot afford to purchase a device at this time.  The patient has been counseled that they do not need to continue to receive services from [facility] to receive a device.  The patient will be given a device free of charge.   Future Appointments  Date Time Provider Department Center  05/27/2024 11:00 AM Devra Lands, RN [facility] None  06/03/2024  2:00 PM Tower, Laine LABOR, MD [facility]  11/17/2024  3:00 PM [facility] ANNUAL WELLNESS VISIT 1 [facility]   [name], PharmD Clinical Pharmacist [facility] [phone]

## 2024-05-19 ENCOUNTER — Other Ambulatory Visit (INDEPENDENT_AMBULATORY_CARE_PROVIDER_SITE_OTHER): Admitting: Pharmacist

## 2024-05-19 ENCOUNTER — Other Ambulatory Visit: Payer: Self-pay

## 2024-05-19 DIAGNOSIS — E785 Hyperlipidemia, unspecified: Secondary | ICD-10-CM

## 2024-05-19 DIAGNOSIS — E1169 Type 2 diabetes mellitus with other specified complication: Secondary | ICD-10-CM

## 2024-05-25 ENCOUNTER — Other Ambulatory Visit: Payer: Self-pay | Admitting: Family Medicine

## 2024-05-25 DIAGNOSIS — K219 Gastro-esophageal reflux disease without esophagitis: Secondary | ICD-10-CM

## 2024-05-27 ENCOUNTER — Other Ambulatory Visit: Payer: Self-pay

## 2024-05-27 NOTE — Patient Instructions (Addendum)
 Visit Information  Thank you for taking time to visit with me today. Please don't hesitate to contact me if I can be of assistance to you before our next scheduled appointment.  Your next care management appointment is by telephone on 06/24/2024 at 11:00 am  Telephone follow-up in 1 month Please call Dr. Carolee (eye doctor) to schedule appointment.   Please call the care guide team at [phone] if you need to cancel, schedule, or reschedule an appointment.   Please call the Suicide and Crisis Lifeline: 988 call the [place]  National Suicide Prevention Lifeline: 610-782-5946 or TTY: 307-164-1317 TTY 343-014-1505) to talk to a trained counselor call 1-800-273-TALK (toll free, 24 hour hotline) go to [facility] [address] [place] [phone]) call 911 if you are experiencing a Mental Health or Behavioral Health Crisis or need someone to talk to.  Nestora Duos, MSN, RN   [facility], [facility] RN Care Manager Direct Dial: [phone] Fax: [phone]  Here is a list of your medications and why you take them:  Tylenol  (Acetaminophen ) - pain control Albuterol  (Proventil ) Inhaler - as needed rescue inhaler for shortness of breath or wheezing Amlodipine  (Norvasc ) - control high blood pressure Atorvastatin  (Lipitor) - control cholesterol Chlorthalidone  - control high blood pressure Vitamin B12 - helps with anemia, nerve damage and thinking Flonase  nasal spray - for allergies and congestion Folic Acid  - helps with anemia and brain health Glipizide  - control blood sugar Hydroxychloroquine - for rheumatoid arthritis Loratidine (Claritin ) - for allergies Methotrexate - for rheumatoid arthritis Ondansetron  - as needed or nausea/vomiting Pantoprazole  - for heartburn, reflux Miralax  - as needed for constipation Potassium Chloride  - supplement to keep levels normal (important for your heart)  Fall Prevention  in the Home, Adult Falls can cause injuries and affect people of all ages. There are many simple things that you can do to make your home safe and to help prevent falls. If you need it, ask for help making these changes. What actions can I take to prevent falls? General information Use good lighting in all rooms. Make sure to: Replace any light bulbs that burn out. Turn on lights if it is dark and use night-lights. Keep items that you use often in easy-to-reach places. Lower the shelves around your home if needed. Move furniture so that there are clear paths around it. Do not keep throw rugs or other things on the floor that can make you trip. If any of your floors are uneven, fix them. Add color or contrast paint or tape to clearly mark and help you see: Grab bars or handrails. First and last steps of staircases. Where the edge of each step is. If you use a ladder or stepladder: Make sure that it is fully opened. Do not climb a closed ladder. Make sure the sides of the ladder are locked in place. Have someone hold the ladder while you use it. Know where your pets are as you move through your home. What can I do in the bathroom?     Keep the floor dry. Clean up any water that is on the floor right away. Remove soap buildup in the bathtub or shower. Buildup makes bathtubs and showers slippery. Use non-skid mats or decals on the floor of the bathtub or shower. Attach bath mats securely with double-sided, non-slip rug tape. If you need to sit down while you are in the shower, use a non-slip stool. Install grab bars by the toilet and in  the bathtub and shower. Do not use towel bars as grab bars. What can I do in the bedroom? Make sure that you have a light by your bed that is easy to reach. Do not use any sheets or blankets on your bed that hang to the floor. Have a firm bench or chair with side arms that you can use for support when you get dressed. What can I do in the  kitchen? Clean up any spills right away. If you need to reach something above you, use a sturdy step stool that has a grab bar. Keep electrical cables out of the way. Do not use floor polish or wax that makes floors slippery. What can I do with my stairs? Do not leave anything on the stairs. Make sure that you have a light switch at the top and the bottom of the stairs. Have them installed if you do not have them. Make sure that there are handrails on both sides of the stairs. Fix handrails that are broken or loose. Make sure that handrails are as long as the staircases. Install non-slip stair treads on all stairs in your home if they do not have carpet. Avoid having throw rugs at the top or bottom of stairs, or secure the rugs with carpet tape to prevent them from moving. Choose a carpet design that does not hide the edge of steps on the stairs. Make sure that carpet is firmly attached to the stairs. Fix any carpet that is loose or worn. What can I do on the outside of my home? Use bright outdoor lighting. Repair the edges of walkways and driveways and fix any cracks. Clear paths of anything that can make you trip, such as tools or rocks. Add color or contrast paint or tape to clearly mark and help you see high doorway thresholds. Trim any bushes or trees on the main path into your home. Check that handrails are securely fastened and in good repair. Both sides of all steps should have handrails. Install guardrails along the edges of any raised decks or porches. Have leaves, snow, and ice cleared regularly. Use sand, salt, or ice melt on walkways during winter months if you live where there is ice and snow. In the garage, clean up any spills right away, including grease or oil spills. What other actions can I take? Review your medicines with your health care provider. Some medicines can make you confused or feel dizzy. This can increase your chance of falling. Wear closed-toe shoes that fit  well and support your feet. Wear shoes that have rubber soles and low heels. Use a cane, walker, scooter, or crutches that help you move around if needed. Talk with your provider about other ways that you can decrease your risk of falls. This may include seeing a physical therapist to learn to do exercises to improve movement and strength. Where to find more information Centers for Disease Control and Prevention, [employer]: TonerPromos.no [employer] on Aging: BaseRingTones.pl National Institute on Aging: BaseRingTones.pl Contact a health care provider if: You are afraid of falling at home. You feel weak, drowsy, or dizzy at home. You fall at home. Get help right away if you: Lose consciousness or have trouble moving after a fall. Have a fall that causes a head injury. These symptoms may be an emergency. Get help right away. Call 911. Do not wait to see if the symptoms will go away. Do not drive yourself to the hospital. This information is not intended to  replace advice given to you by your health care provider. Make sure you discuss any questions you have with your health care provider. Document Revised: 06/09/2022 Document Reviewed: 06/09/2022 Elsevier Patient Education  2024 Elsevier Inc. Triamcinolone  cream - cream as needed for redness, itching, skin discomfort

## 2024-05-27 NOTE — Patient Outreach (Signed)
 Complex Care Management   Visit Note  05/27/2024  Name:  [name] MRN: [id number] DOB: 06/22/1941  Situation: Referral received for Complex Care Management related to RA and Falls I obtained verbal consent from Patient.  Visit completed with Patient  on the phone  Background:   Past Medical History:  Diagnosis Date   Allergic rhinitis    Cataract 2015   bilateral   COVID-19 10/2019   ASYMPTOMATIC   Diabetes mellitus without complication (HCC)    Diverticulosis    Esophageal reflux    Essential hypertension 01/07/2016   Fatty liver 2018   Fibula fracture 02/2005   Former smoker    Gastritis 2003   Heart murmur    MILD, NO CARDIOLOGIST   Hemorrhoids    History of cardiac dysrhythmia 2011   ABLATION  AT [facility]   History of kidney stones YRS AGO   Hyperglycemia    Hyperlipemia    Osteoarthritis    Personal history of colonic polyps 1998   hyperplastic   PMB (postmenopausal bleeding)    Pre-diabetes    BORDERLINE DIET CONTROLLED DOES NOT CHECK CBG   RA (rheumatoid arthritis) (HCC)    Rectal bleed 2018   TRANSFUSION GIVEN   Shingles 2012   Uterine fibroid     Assessment: Patient Reported Symptoms:  Cognitive Cognitive Status: Struggling with memory recall Cognitive/Intellectual Conditions Management [RPT]: None reported or documented in medical history or problem list   Health Maintenance Behaviors: Annual physical exam, Healthy diet, Spiritual practice(s) Healing Pattern: Average Health Facilitated by: Pain control, Prayer/meditation, Healthy diet  Neurological Neurological Review of Symptoms: Other: [place] Neurological Symptoms/Conditions [RPT]: Neuropathy pain in feet - tyenol and elevation Neurological Management Strategies: Routine screening, Medication therapy  HEENT HEENT Symptoms Reported: Nasal discharge HEENT Management Strategies: Medication therapy, Routine screening HEENT Comment: seasonal allergies - claritin     Cardiovascular Cardiovascular  Symptoms Reported: No symptoms reported Does patient have uncontrolled Hypertension?: No Cardiovascular Management Strategies: Routine screening, Medication therapy Cardiovascular Self-Management Outcome: 4 (good)  Respiratory Respiratory Symptoms Reported: No symptoms reported Respiratory Management Strategies: Routine screening, Medication therapy Respiratory Self-Management Outcome: 4 (good)  Endocrine Is patient diabetic?: Yes Is patient checking blood sugars at home?: No List most recent blood sugar readings, include date and time of day: does not check regulalry - does not recall last BG, denies sx of high/low BG    Gastrointestinal Additional Gastrointestinal Details: decreased appetite w weight loss - will take ensure 2x day as discussed Gastrointestinal Management Strategies: Diet modification Gastrointestinal Comment: constipation managed with miralax     Genitourinary Genitourinary Symptoms Reported: No symptoms reported    Integumentary Integumentary Symptoms Reported: No symptoms reported Skin Management Strategies: Routine screening  Musculoskeletal Musculoskelatal Symptoms Reviewed: Difficulty walking, Joint pain, Muscle pain, Limited mobility, Weakness, Back pain, Unsteady gait Additional Musculoskeletal Details: RA pain - neuropathic pain in feet, legs weak at times Musculoskeletal Management Strategies: Routine screening, Medication therapy Falls in the past year?: Yes Number of falls in past year: 1 or less Was there an injury with Fall?: Yes Fall Risk Category Calculator: 2 Patient Fall Risk Level: Moderate Fall Risk Patient at Risk for Falls Due to: History of fall(s), Impaired balance/gait, Impaired mobility Fall risk Follow up: Falls evaluation completed, Falls prevention discussed  Psychosocial Psychosocial Symptoms Reported: No symptoms reported Behavioral Management Strategies: Support system Behavioral Health Self-Management Outcome: 4 (good) Major  Change/Loss/Stressor/Fears (CP): Medical condition, self Behaviors When Feeling Stressed/Fearful: prayer Techniques to Cope with Loss/Stress/Change: Diversional activities, Spiritual practice(s)  Quality of Family Relationships: helpful, involved, supportive Do you feel physically threatened by others?: No      05/27/2024   11:44 AM  Depression screen PHQ 2/9  Decreased Interest 0  Down, Depressed, Hopeless 0  PHQ - 2 Score 0    There were no vitals filed for this visit.  Medications Reviewed Today     Reviewed by Devra Lands, RN (Registered Nurse) on 05/27/24 at 1127  Med List Status: <None>   Medication Order Taking? Sig Documenting Provider Last Dose Status Informant  ACCU-CHEK GUIDE TEST test strip [id number] Yes USE TO CHECK BLOOD SUGAR DAILY Tower, Laine LABOR, MD  Active   Accu-Chek Softclix Lancets lancets [id number] Yes Check glucose level daily and as needed for diabetes type 2  (lancets for the soft clicks lancing device AccuChek meter) Tower, Laine LABOR, MD  Active   acetaminophen  (TYLENOL ) 325 MG tablet [id number] Yes Take 650 mg by mouth every 6 (six) hours as needed. [provider]  Active Self  albuterol  (PROVENTIL  HFA;VENTOLIN  HFA) 108 (90 Base) MCG/ACT inhaler [id number] Yes Inhale 2 puffs into the lungs every 4 (four) hours as needed for wheezing or shortness of breath. Please give spacer if possible Tower, Laine LABOR, MD  Active Self  amLODipine  (NORVASC ) 5 MG tablet [id number] Yes Take 1 tablet (5 mg total) by mouth daily. Tower, Laine LABOR, MD  Active   atorvastatin  (LIPITOR) 10 MG tablet [id number] Yes TAKE 1/2 TABLET BY MOUTH EVERY OTHER DAY Tower, Laine LABOR, MD  Active   blood glucose meter kit and supplies [id number] Yes To check glucose daily and prn for DM2 E11.9 Tower, Laine LABOR, MD  Active   chlorthalidone  (HYGROTON ) 25 MG tablet [id number] Yes Take 1 tablet (25 mg total) by mouth daily. Tower, Laine LABOR, MD  Active   cyanocobalamin  (VITAMIN B12) 500 MCG tablet [id number]  Yes Take 500 mcg by mouth daily. [provider]  Active   EPINEPHrine [facility] IJ) [id number] Yes Inject as directed as needed. Reported on 12/05/2015 [provider]  Active Self  fluticasone  (FLONASE ) 50 MCG/ACT nasal spray [id number] Yes PLACE 2 SPRAYS INTO THE NOSE DAILY. Tower, Laine LABOR, MD  Active            Med Note DORI, DAVINA E   Tue Mar 29, 2024 11:29 AM) Patient states she takes as needed.   folic acid  (FOLVITE ) 1 MG tablet [id number] Yes Take 1 mg by mouth daily. [provider]  Active Self  glipiZIDE  (GLUCOTROL  XL) 2.5 MG 24 hr tablet [id number] Yes Take 1 tablet (2.5 mg total) by mouth daily with breakfast. Tower, Laine LABOR, MD  Active   hydroxychloroquine (PLAQUENIL) 200 MG tablet [id number] Yes Take 200 mg by mouth 2 (two) times daily. [provider]  Active   loratadine  (CLARITIN ) 10 MG tablet [id number] Yes Take 10 mg by mouth daily. MAY TAKE AT [facility] ALSO [provider]  Active Self  methotrexate (RHEUMATREX) 2.5 MG tablet [id number] Yes Take 15 mg by mouth once a week. Caution:Chemotherapy. Protect from light. ( Tuesday of each week ) [provider]  Active Self  ondansetron  (ZOFRAN ) 4 MG tablet [id number] Yes Take 1 tablet (4 mg total) by mouth every 8 (eight) hours as needed for nausea or vomiting. Caution of sedation Tower, Laine LABOR, MD  Active   pantoprazole  (PROTONIX ) 40 MG tablet [id number] Yes TAKE 1 TABLET BY MOUTH EVERY DAY Tower, Laine LABOR, MD  Active   polyethylene glycol (MIRALAX  / GLYCOLAX )  packet [id number] Yes Take 17 g by mouth daily. [provider]  Active Self  potassium chloride  (KLOR-CON  M) 10 MEQ tablet [id number] Yes TAKE 1 TABLET BY MOUTH TWICE A DAY Tower, Marne A, MD  Active   triamcinolone  cream (KENALOG ) 0.1 % [id number] Yes Apply 1 Application topically 2 (two) times daily as needed. On rash/affected area Tower, Laine LABOR, MD  Active             Recommendation:   PCP Follow-up Continue Current Plan of  Care Call Dr Carolee (eye doctor) to schedule appointment  Follow Up Plan:   Telephone follow-up in 1 month  Nestora Duos, MSN, RN [facility]  [facility], [facility] RN Care Manager Direct Dial: [phone] Fax: [phone]

## 2024-05-29 ENCOUNTER — Telehealth: Payer: Self-pay | Admitting: Family Medicine

## 2024-05-29 NOTE — Telephone Encounter (Signed)
 Made aware from amw visit   Devra Lands, RN  Jo Booze, Laine LABOR, MD Hello, I spoke with Ms Girten today. She has an appointment with you on 8/15 and wanted me to share the following information. Her insurance company will not provide a BP cuff, could you please order her a regular cuff? She is using a cane and her gait remains weak/unsteady and she has a history of multiple falls with injury. She does not have her own walker (was using husband's and reports too big to move in house). Could you please order a 3 wheel walker? She is also interested in PT for balance, strengthening. She is concerned about her weight loss and decreased appetite (thinks it is med related) and would like to see ENT regarding swallowing issues from previous surgery. She also has 2 hard areas on her backside that she would like checked - not open or draining. Please let me know if questions. Thank you.   This is a lot to address, so thanks for letting me know  We will try our best at her follow up appointment We may need to put off health mt to address these things but that is ok because that can be re scheduled  Please let her know  I will see her on 8/15

## 2024-05-30 NOTE — Telephone Encounter (Signed)
 Pt notified of Dr. Graham comments. Appt note changed to address multiple issues

## 2024-06-03 ENCOUNTER — Encounter: Payer: Self-pay | Admitting: Family Medicine

## 2024-06-03 ENCOUNTER — Other Ambulatory Visit: Payer: Self-pay | Admitting: Family Medicine

## 2024-06-03 ENCOUNTER — Ambulatory Visit: Payer: Self-pay

## 2024-06-03 ENCOUNTER — Ambulatory Visit (INDEPENDENT_AMBULATORY_CARE_PROVIDER_SITE_OTHER): Payer: Medicare Other | Admitting: Family Medicine

## 2024-06-03 VITALS — BP 136/84 | HR 84 | Temp 98.1°F | Ht 67.0 in

## 2024-06-03 DIAGNOSIS — R630 Anorexia: Secondary | ICD-10-CM

## 2024-06-03 DIAGNOSIS — M069 Rheumatoid arthritis, unspecified: Secondary | ICD-10-CM | POA: Diagnosis not present

## 2024-06-03 DIAGNOSIS — R531 Weakness: Secondary | ICD-10-CM | POA: Diagnosis not present

## 2024-06-03 DIAGNOSIS — M1990 Unspecified osteoarthritis, unspecified site: Secondary | ICD-10-CM | POA: Diagnosis not present

## 2024-06-03 DIAGNOSIS — Z7409 Other reduced mobility: Secondary | ICD-10-CM

## 2024-06-03 DIAGNOSIS — M7918 Myalgia, other site: Secondary | ICD-10-CM | POA: Diagnosis not present

## 2024-06-03 DIAGNOSIS — R2689 Other abnormalities of gait and mobility: Secondary | ICD-10-CM | POA: Diagnosis not present

## 2024-06-03 DIAGNOSIS — M25469 Effusion, unspecified knee: Secondary | ICD-10-CM | POA: Diagnosis not present

## 2024-06-03 DIAGNOSIS — I10 Essential (primary) hypertension: Secondary | ICD-10-CM | POA: Diagnosis not present

## 2024-06-03 DIAGNOSIS — F4323 Adjustment disorder with mixed anxiety and depressed mood: Secondary | ICD-10-CM

## 2024-06-03 MED ORDER — ADULT BLOOD PRESSURE CUFF LG KIT: PACK | 0 refills | Status: AC

## 2024-06-03 NOTE — Assessment & Plan Note (Signed)
 More pain and swelling in knees today  More on right  Taking methotrexate and plaquenil and prednisone    Will schedule follow up with rheumatology

## 2024-06-03 NOTE — Assessment & Plan Note (Signed)
 Pt has noted 'hard' areas in sacral area  Nothing noted on exam today  ? If she had early decubitus changes that resolved She does sit a fair amount - encouraged trial of a donut pillow

## 2024-06-03 NOTE — Telephone Encounter (Signed)
 Copied from CRM #[id number]. Topic: Clinical - Red Word Triage >> Jun 03, 2024  9:57 AM [place] J wrote: [facility] that prompted transfer to Nurse Triage: Patient is having swelling and pain on both knees. Reason for Disposition  [1] SEVERE pain (e.g., excruciating, unable to walk) AND [2] not improved after 2 hours of pain medicine  Answer Assessment - Initial Assessment Questions 1. LOCATION and RADIATION: Where is the pain located?      Both knees 2. QUALITY: What does the pain feel like?  (e.g., sharp, dull, aching, burning)     weakness 3. SEVERITY: How bad is the pain? What does it keep you from doing?   (Scale 1-10; or mild, moderate, severe)     10 10 4. ONSET: When did the pain start? Does it come and go, or is it there all the time?     Recent  6. SETTING: Has there been any recent work, exercise or other activity that involved that part of the body?      no 7. AGGRAVATING FACTORS: What makes the knee pain worse? (e.g., walking, climbing stairs, running)     walking 8. ASSOCIATED SYMPTOMS: Is there any swelling or redness of the knee?     Yes swelling 9. OTHER SYMPTOMS: Do you have any other symptoms? (e.g., calf pain, chest pain, difficulty breathing, fever)     Left knee bigger than right knee, weakness of leg  10. PREGNANCY: Is there any chance you are pregnant? When was your last menstrual period?       N/a  Protocols used: Knee Pain-A-AH

## 2024-06-03 NOTE — Assessment & Plan Note (Signed)
 No doubt adding to appetite decrease  Has declined treatment

## 2024-06-03 NOTE — Assessment & Plan Note (Signed)
 bp in fair control at this time  BP Readings from Last 1 Encounters:  06/03/24 136/84   No changes needed Most recent labs reviewed  Disc lifstyle change with low sodium diet and exercise   Continues Chlorthalidone  25 mg daily  Amlodipine  5 mg daily   Encouraged to re check at home -she needs a cuff Doubt insurance will cover-but printed pt for blood pressure cuff sized large /automated

## 2024-06-03 NOTE — Progress Notes (Signed)
 Subjective:    Patient ID: [name], female    DOB: 10/31/1940, 83 y.o.   MRN: [id number]  HPI  Wt Readings from Last 3 Encounters:  03/29/24 153 lb (69.4 kg)  03/03/24 153 lb (69.4 kg)  02/02/24 154 lb 6 oz (70 kg)   23.96 kg/m  Vitals:   06/03/24 1355  BP: 136/84  Pulse: 84  Temp: 98.1 F (36.7 C)  SpO2: 97%   Per pt - down to 150 lb at her rheum appointment    Pt presents today with following concerns   Leg swelling (knee)   Needs a blood pressure cuff Need for 3 wheeled walker  Needs PT for strength and balance (home physical therapy)  Decreased appetite and weight loss from that  2 spots on back side that are bothersome    Left knee was hurting and painful  Then right knee - is worse  Has not fell in a while  Walks a little  Husband's walker is too big   Methotrexate Plaqueinil  Prednisone    Wants HH PT She cannot drive  Arthritis makes it hard for her to move Is causing muscle loss /loss of strength   Spots on back side  2-4 weeks  Hurt to sit  Hard knots  Not open or draining  '  HTN bp is stable today  No cp or palpitations or headaches or edema  No side effects to medicines  BP Readings from Last 3 Encounters:  06/03/24 136/84  03/03/24 134/69  02/02/24 (!) 145/70     Lab Results  Component Value Date   NA 139 03/23/2024   K 3.7 03/23/2024   CO2 28 03/23/2024   GLUCOSE 86 03/23/2024   BUN 9 03/23/2024   CREATININE 0.63 03/23/2024   CALCIUM  9.2 03/23/2024   GFR 82.93 03/23/2024   GFRNONAA >60 01/26/2017   Taking  Chlorthalidone  Amlodipine  5 mg    Patient Active Problem List   Diagnosis Date Noted   Knee swelling 06/03/2024   Buttock pain 06/03/2024   Injury of right hip 02/02/2024   Coccyx pain 02/02/2024   Fall at home, initial encounter 02/02/2024   Estrogen deficiency 12/02/2023   Depressed mood 09/05/2023   Hypokalemia 09/04/2023   Dizziness 08/26/2023   Decreased appetite 08/26/2023   History of  UTI 08/26/2023   Atypical pneumonia 08/26/2023   Heart murmur 08/27/2022   Mobility impaired 07/22/2022   Current use of proton pump inhibitor 10/01/2021   General weakness 10/01/2021   Poor balance 10/01/2021   On prednisone  therapy 10/01/2021   Diabetes mellitus treated with oral medication (HCC) 11/13/2020   Hyperlipidemia associated with type 2 diabetes mellitus (HCC) 10/15/2020   Fatigue 01/30/2020   Encounter for screening mammogram for breast cancer 10/27/2017   Routine general medical examination at a health care facility 10/27/2017   Aortic atherosclerosis (HCC) 02/03/2017   RA (rheumatoid arthritis) (HCC)    Adjustment disorder with mixed anxiety and depressed mood 06/24/2016   Essential hypertension 01/07/2016   Coronary atherosclerosis 11/07/2015   Osteoarthritis 05/10/2014   GERD 07/09/2007   Past Medical History:  Diagnosis Date   Allergic rhinitis    Cataract 2015   bilateral   COVID-19 10/2019   ASYMPTOMATIC   Diabetes mellitus without complication (HCC)    Diverticulosis    Esophageal reflux    Essential hypertension 01/07/2016   Fatty liver 2018   Fibula fracture 02/2005   Former smoker    Gastritis 2003  Heart murmur    MILD, NO CARDIOLOGIST   Hemorrhoids    History of cardiac dysrhythmia 2011   ABLATION  AT [facility]   History of kidney stones YRS AGO   Hyperglycemia    Hyperlipemia    Osteoarthritis    Personal history of colonic polyps 1998   hyperplastic   PMB (postmenopausal bleeding)    Pre-diabetes    BORDERLINE DIET CONTROLLED DOES NOT CHECK CBG   RA (rheumatoid arthritis) (HCC)    Rectal bleed 2018   TRANSFUSION GIVEN   Shingles 2012   Uterine fibroid    Past Surgical History:  Procedure Laterality Date   CHOLECYSTECTOMY  YRS AGO   COLONOSCOPY  11/08   internal hemorrhoids   DILATATION & CURETTAGE/HYSTEROSCOPY WITH MYOSURE N/A 07/06/2020   Procedure: DILATATION & CURETTAGE/HYSTEROSCOPY;  Surgeon: Arnaldo Purchase, MD;  Location:  [facility] [place];  Service: Gynecology;  Laterality: N/A;  request 7:30am OR time in [place] [facility] block requests 30 minutes   EP procedure  1994   SVT; normal echo   ESOPHAGOGASTRODUODENOSCOPY  11/03   reactive gastropathy   EYE SURGERY Bilateral 2017   CATARACTS   LIPOMA EXCISION  11/2002   SVT s/p surgery--? ablation  2011   AT [facility]   TUBAL LIGATION  YRS AGO   vaginal polyp excised  12/2000   benign   Social History   Tobacco Use   Smoking status: Former    Current packs/day: 0.00    Average packs/day: 0.8 packs/day for 15.0 years (11.3 ttl pk-yrs)    Types: Cigarettes    Start date: 10/20/1988    Quit date: 10/21/2003    Years since quitting: 20.6   Smokeless tobacco: Never  Vaping Use   Vaping status: Never Used  Substance Use Topics   Alcohol use: No    Alcohol/week: 0.0 standard drinks of alcohol   Drug use: No   Family History  Problem Relation Age of Onset   Colon cancer Father 26   Diabetes Mother    Heart disease Mother    Kidney disease Mother        ESRD   Stroke Mother    Hypertension Brother    Breast cancer Sister 30   Diabetes Other        nephews   Colon cancer Sister    Esophageal cancer Neg Hx    Stomach cancer Neg Hx    Rectal cancer Neg Hx    Allergies  Allergen Reactions   Bee Venom Anaphylaxis   Dicyclomine      Blurry vision   Humira [Adalimumab]     LIPS SWELLED AND RASH   Metformin  And Related     rash   Current Outpatient Medications on File Prior to Visit  Medication Sig Dispense Refill   ACCU-CHEK GUIDE TEST test strip USE TO CHECK BLOOD SUGAR DAILY 100 strip 1   Accu-Chek Softclix Lancets lancets Check glucose level daily and as needed for diabetes type 2  (lancets for the soft clicks lancing device AccuChek meter) 100 each 3   acetaminophen  (TYLENOL ) 325 MG tablet Take 650 mg by mouth every 6 (six) hours as needed.     albuterol  (PROVENTIL  HFA;VENTOLIN  HFA) 108 (90 Base) MCG/ACT inhaler Inhale 2 puffs into the  lungs every 4 (four) hours as needed for wheezing or shortness of breath. Please give spacer if possible 1 Inhaler 3   amLODipine  (NORVASC ) 5 MG tablet Take 1 tablet (5 mg total) by mouth daily. 90 tablet  3   blood glucose meter kit and supplies To check glucose daily and prn for DM2 E11.9 1 each 0   chlorthalidone  (HYGROTON ) 25 MG tablet Take 1 tablet (25 mg total) by mouth daily. 90 tablet 3   cyanocobalamin  (VITAMIN B12) 500 MCG tablet Take 500 mcg by mouth daily.     EPINEPHrine (EPIPEN IJ) Inject as directed as needed. Reported on 12/05/2015     fluticasone  (FLONASE ) 50 MCG/ACT nasal spray PLACE 2 SPRAYS INTO THE NOSE DAILY. 48 g 3   folic acid  (FOLVITE ) 1 MG tablet Take 1 mg by mouth daily.     glipiZIDE  (GLUCOTROL  XL) 2.5 MG 24 hr tablet Take 1 tablet (2.5 mg total) by mouth daily with breakfast. 90 tablet 3   hydroxychloroquine (PLAQUENIL) 200 MG tablet Take 200 mg by mouth 2 (two) times daily.     loratadine  (CLARITIN ) 10 MG tablet Take 10 mg by mouth daily. MAY TAKE AT HS ALSO     methotrexate (RHEUMATREX) 2.5 MG tablet Take 15 mg by mouth once a week. Caution:Chemotherapy. Protect from light. ( Tuesday of each week )     ondansetron  (ZOFRAN ) 4 MG tablet Take 1 tablet (4 mg total) by mouth every 8 (eight) hours as needed for nausea or vomiting. Caution of sedation 20 tablet 0   pantoprazole  (PROTONIX ) 40 MG tablet TAKE 1 TABLET BY MOUTH EVERY DAY 90 tablet 0   polyethylene glycol (MIRALAX  / GLYCOLAX ) packet Take 17 g by mouth daily.     potassium chloride  (KLOR-CON  M) 10 MEQ tablet TAKE 1 TABLET BY MOUTH TWICE A DAY 180 tablet 1   triamcinolone  cream (KENALOG ) 0.1 % Apply 1 Application topically 2 (two) times daily as needed. On rash/affected area 15 g 0   No current facility-administered medications on file prior to visit.    Review of Systems  Constitutional:  Positive for activity change, appetite change, fatigue and unexpected weight change. Negative for fever.  HENT:  Negative  for congestion, ear pain, rhinorrhea, sinus pressure and sore throat.   Eyes:  Negative for pain, redness and visual disturbance.  Respiratory:  Negative for cough, shortness of breath and wheezing.   Cardiovascular:  Negative for chest pain and palpitations.  Gastrointestinal:  Negative for abdominal pain, blood in stool, constipation and diarrhea.  Endocrine: Negative for polydipsia and polyuria.  Genitourinary:  Negative for dysuria, frequency and urgency.  Musculoskeletal:  Positive for arthralgias, back pain and myalgias.  Skin:  Negative for pallor and rash.       Spots on buttocks   Allergic/Immunologic: Negative for environmental allergies.  Neurological:  Negative for dizziness, syncope and headaches.  Hematological:  Negative for adenopathy. Does not bruise/bleed easily.  Psychiatric/Behavioral:  Negative for decreased concentration and dysphoric mood. The patient is not nervous/anxious.        Objective:   Physical Exam Constitutional:      General: She is not in acute distress.    Appearance: Normal appearance. She is well-developed and normal weight. She is not ill-appearing or diaphoretic.     Comments: Frail appearing  In wheelchair   HENT:     Head: Normocephalic and atraumatic.     Mouth/Throat:     Mouth: Mucous membranes are moist.  Eyes:     General: No scleral icterus.       Right eye: No discharge.        Left eye: No discharge.     Conjunctiva/sclera: Conjunctivae normal.     Pupils:  Pupils are equal, round, and reactive to light.  Neck:     Thyroid : No thyromegaly.     Vascular: No carotid bruit or JVD.  Cardiovascular:     Rate and Rhythm: Normal rate and regular rhythm.     Heart sounds: Normal heart sounds.     No gallop.  Pulmonary:     Effort: Pulmonary effort is normal. No respiratory distress.     Breath sounds: Normal breath sounds. No wheezing or rales.  Abdominal:     General: There is no distension or abdominal bruit.     Palpations:  Abdomen is soft. There is no mass.     Tenderness: There is no abdominal tenderness.  Genitourinary:    Comments: One external hemorrhoidal skin tag  Musculoskeletal:     Cervical back: Normal range of motion and neck supple.     Comments: Bilateral knee swelling Worse on right  No erythema  Mild warmth of both sides  Limited rom of both knees due to pain with crepitus   Lymphadenopathy:     Cervical: No cervical adenopathy.  Skin:    General: Skin is warm and dry.     Coloration: Skin is not jaundiced or pale.     Findings: No bruising or rash.     Comments: Unable to appreciate any sacral skin changes (color or breakdown)     Neurological:     Mental Status: She is alert.     Coordination: Coordination normal.     Deep Tendon Reflexes: Reflexes are normal and symmetric. Reflexes normal.     Comments: Generalized weakness Needs help of at least one person to rise from wheelchair (even using arms)  Unable to take any steps without support  This causes her significant pain   Is able to grasp arms of chair and walker    Psychiatric:        Attention and Perception: Attention normal.        Mood and Affect: Mood is depressed. Mood is not anxious.        Behavior: Behavior normal.     Comments: Mildly depressed mood           Assessment & Plan:   Problem List Items Addressed This Visit       Cardiovascular and Mediastinum   Essential hypertension   bp in fair control at this time  BP Readings from Last 1 Encounters:  06/03/24 136/84   No changes needed Most recent labs reviewed  Disc lifstyle change with low sodium diet and exercise   Continues Chlorthalidone  25 mg daily  Amlodipine  5 mg daily   Encouraged to re check at home -she needs a cuff Doubt insurance will cover-but printed pt for blood pressure cuff sized large /automated         Musculoskeletal and Integument   RA (rheumatoid arthritis) (HCC)   More pain and swelling in knees today  More on  right  Taking methotrexate and plaquenil and prednisone    Will schedule follow up with rheumatology      Relevant Orders   Walker rolling   Osteoarthritis   Both OA and RA -causing joint pain and swelling today      Relevant Orders   Walker rolling     Other   Poor balance   Adding to mobility impairment  No recent falls thankfully       Relevant Orders   Walker rolling   Mobility impaired - Primary   From weakness  and pain  Needs new walker to fit her  Pt needs assit to stand/ can walk short distances with walker   3 wheeled walker prescription given  Also HH for PT ordered       Relevant Orders   Walker rolling   Knee swelling   Right more than left  Intermittent  In setting of RA and OA  May need injection and or aspiration  Pt plans to schedule rheum follow up for this       General weakness   Most likely caused by less movement (due to chronic pain , both RA and OA)   Interested in PT  Cannot drive  HH ordered  Also 3 wheeled rolling walker (using husband's which is too large for her)   Pt cannot rise from wheelchair w/o significant help from 1-2 people  Balance is also poor         Relevant Orders   Walker rolling   Decreased appetite   Unsure of cause  May be due to chronic pain /mood or multi factorial  Doubtful from any particular medication but will continue to monitor  Encouraged her to eat small amounts more frequently  Reviewed protein sources       Buttock pain   Pt has noted 'hard' areas in sacral area  Nothing noted on exam today  ? If she had early decubitus changes that resolved She does sit a fair amount - encouraged trial of a donut pillow       Adjustment disorder with mixed anxiety and depressed mood   No doubt adding to appetite decrease  Has declined treatment

## 2024-06-03 NOTE — Assessment & Plan Note (Signed)
 From weakness and pain  Needs new walker to fit her  Pt needs assit to stand/ can walk short distances with walker   3 wheeled walker prescription given  Also HH for PT ordered

## 2024-06-03 NOTE — Assessment & Plan Note (Signed)
 Both OA and RA -causing joint pain and swelling today

## 2024-06-03 NOTE — Assessment & Plan Note (Signed)
 Most likely caused by less movement (due to chronic pain , both RA and OA)   Interested in PT  Cannot drive  HH ordered  Also 3 wheeled rolling walker (using husband's which is too large for her)   Pt cannot rise from wheelchair w/o significant help from 1-2 people  Balance is also poor

## 2024-06-03 NOTE — Assessment & Plan Note (Signed)
 Unsure of cause  May be due to chronic pain /mood or multi factorial  Doubtful from any particular medication but will continue to monitor  Encouraged her to eat small amounts more frequently  Reviewed protein sources

## 2024-06-03 NOTE — Assessment & Plan Note (Signed)
 Right more than left  Intermittent  In setting of RA and OA  May need injection and or aspiration  Pt plans to schedule rheum follow up for this

## 2024-06-03 NOTE — Patient Instructions (Addendum)
 When you get home call rheumatology (Dr Willadean) office to get appointment for the knees  You may need an injection in one or both knees Continue current rheumatologic medicines   I'm not sure what is causing you to have a low appetite It could be age or chronic pain  Try to get many small snacks through the day   Make sure to drink fluids also   With protein  The following are examples of protein in diet  Meat  Fish  Eggs  Dairy products  Soy products  Oat milk  Legumes  Almond milk Nuts and nut butters  Dried beans  Low sugar protein shakes are good also    You don't have to eat a lot but try to eat more frequently   Here is prescription for  Blood pressure cuff Walker   You can take to a medical supply company   For buttock spots / you can try sitting on a donut shaped pillow to offload the pressure   I will work on a home health referral for physical therapy   Please let us  know if you don't hear in 1-2 weeks to set that up [employer] message or call or letter)

## 2024-06-03 NOTE — Telephone Encounter (Signed)
 Will see patient then Agree with ER and UC precautions

## 2024-06-03 NOTE — Assessment & Plan Note (Signed)
 Adding to mobility impairment  No recent falls thankfully

## 2024-06-09 ENCOUNTER — Encounter: Payer: Self-pay | Admitting: Pharmacist

## 2024-06-09 NOTE — Progress Notes (Signed)
 Pharmacy Quality Measure Review  This patient is appearing on a report for being at risk of failing the adherence measure for cholesterol (statin) medications this calendar year.   Medication: atorvastatin  10 mg Last fill date: 04/21/24 for 44 day supply  New Rx has been sent as of 06/03/24. Pharmacy continues to bill for the wrong day supply. They are doing the math incorrectly and billing patient for 44 day supply. 22 tablets = 88 day supply.SABRASABRA

## 2024-06-14 ENCOUNTER — Other Ambulatory Visit: Payer: Self-pay | Admitting: Family Medicine

## 2024-06-14 DIAGNOSIS — E876 Hypokalemia: Secondary | ICD-10-CM

## 2024-06-15 ENCOUNTER — Other Ambulatory Visit: Payer: Self-pay | Admitting: Family Medicine

## 2024-06-24 ENCOUNTER — Other Ambulatory Visit: Payer: Self-pay

## 2024-06-24 DIAGNOSIS — E1169 Type 2 diabetes mellitus with other specified complication: Secondary | ICD-10-CM

## 2024-06-24 NOTE — Patient Outreach (Signed)
 Complex Care Management   Visit Note  06/24/2024  Name:  [name] MRN: [id number] DOB: 11/03/1940  Situation: Referral received for Complex Care Management related to RA, Falls, Med Management I obtained verbal consent from Patient.  Visit completed with Patient  on the phone  Background:   Past Medical History:  Diagnosis Date   Allergic rhinitis    Cataract 2015   bilateral   COVID-19 10/2019   ASYMPTOMATIC   Diabetes mellitus without complication (HCC)    Diverticulosis    Esophageal reflux    Essential hypertension 01/07/2016   Fatty liver 2018   Fibula fracture 02/2005   Former smoker    Gastritis 2003   Heart murmur    MILD, NO CARDIOLOGIST   Hemorrhoids    History of cardiac dysrhythmia 2011   ABLATION  AT [facility]   History of kidney stones YRS AGO   Hyperglycemia    Hyperlipemia    Osteoarthritis    Personal history of colonic polyps 1998   hyperplastic   PMB (postmenopausal bleeding)    Pre-diabetes    BORDERLINE DIET CONTROLLED DOES NOT CHECK CBG   RA (rheumatoid arthritis) (HCC)    Rectal bleed 2018   TRANSFUSION GIVEN   Shingles 2012   Uterine fibroid     Assessment: Patient Reported Symptoms:  Cognitive Cognitive Status: Struggling with memory recall, Alert and oriented to person, place, and time, Normal speech and language skills Cognitive/Intellectual Conditions Management [RPT]: None reported or documented in medical history or problem list   Health Maintenance Behaviors: Annual physical exam, Healthy diet, Spiritual practice(s) Healing Pattern: Average Health Facilitated by: Prayer/meditation, Healthy diet  Neurological Neurological Review of Symptoms: Numbness Oher Neurological Symptoms/Conditions [RPT]: neuopathy in feet Neurological Management Strategies: Routine screening, Medication therapy  HEENT HEENT Symptoms Reported: Nasal discharge HEENT Management Strategies: Medication therapy HEENT Comment: seasonal allergies -  claritin     Cardiovascular Cardiovascular Symptoms Reported: No symptoms reported Does patient have uncontrolled Hypertension?: No Cardiovascular Management Strategies: Routine screening, Medication therapy Cardiovascular Comment: encouraged patient to get BP cuff with U-Card  Respiratory Respiratory Symptoms Reported: No symptoms reported Respiratory Management Strategies: Routine screening, Medication therapy  Endocrine Endocrine Symptoms Reported: No symptoms reported Is patient diabetic?: Yes List most recent blood sugar readings, include date and time of day: does not check regulalrly anymore, difficulty getting blood from fingers, no sx high/low    Gastrointestinal Additional Gastrointestinal Details: decreased appetite but improving and eating healthy and regularly, ensure 2x day Gastrointestinal Management Strategies: Diet modification    Genitourinary Genitourinary Symptoms Reported: No symptoms reported    Integumentary Integumentary Symptoms Reported: No symptoms reported Skin Management Strategies: Routine screening  Musculoskeletal Musculoskelatal Symptoms Reviewed: Back pain, Difficulty walking, Joint pain, Limited mobility, Muscle pain, Unsteady gait, Weakness Additional Musculoskeletal Details: RA and Neuropathy - new rolling walker and starting PT tomorrow Musculoskeletal Management Strategies: Routine screening Falls in the past year?: Yes Number of falls in past year: 2 or more Was there an injury with Fall?: Yes Fall Risk Category Calculator: 3 Patient Fall Risk Level: High Fall Risk Patient at Risk for Falls Due to: Impaired balance/gait, History of fall(s), Impaired mobility Fall risk Follow up: Falls evaluation completed, Education provided, Falls prevention discussed  Psychosocial Psychosocial Symptoms Reported: Depression - if selected complete PHQ 2-9 Behavioral Management Strategies: Support group Behavioral Health Comment: declined LCSW and  medications Major Change/Loss/Stressor/Fears (CP): Medical condition, self Behaviors When Feeling Stressed/Fearful: prayer Techniques to Cope with Loss/Stress/Change: Diversional activities, Spiritual practice(s) Quality  of Family Relationships: helpful, involved, supportive Do you feel physically threatened by others?: No    06/24/2024    PHQ2-9 Depression Screening   Little interest or pleasure in doing things Not at all  Feeling down, depressed, or hopeless Nearly every day (hopeless because I cannot do what i want to do)  PHQ-2 - Total Score 3  Trouble falling or staying asleep, or sleeping too much More than half the days  Feeling tired or having little energy Nearly every day  Poor appetite or overeating  Nearly every day  Feeling bad about yourself - or that you are a failure or have let yourself or your family down Several days  Trouble concentrating on things, such as reading the newspaper or watching television Not at all  Moving or speaking so slowly that other people could have noticed.  Or the opposite - being so fidgety or restless that you have been moving around a lot more than usual Not at all  Thoughts that you would be better off dead, or hurting yourself in some way Several days (SOme days feels likewould be better off if the Lord took me and then gets over it - no plans to harm self)  PHQ2-9 Total Score 13  If you checked off any problems, how difficult have these problems made it for you to do your work, take care of things at home, or get along with other people Very difficult  Depression Interventions/Treatment Patient refuses Treatment    There were no vitals filed for this visit.  Medications Reviewed Today     Reviewed by Devra Lands, RN (Registered Nurse) on 06/24/24 at 1127  Med List Status: <None>   Medication Order Taking? Sig Documenting Provider Last Dose Status Informant  ACCU-CHEK GUIDE TEST test strip [id number] Yes USE TO CHECK BLOOD SUGAR  DAILY Tower, Laine LABOR, MD  Active   Accu-Chek Softclix Lancets lancets [id number] Yes Check glucose level daily and as needed for diabetes type 2  (lancets for the soft clicks lancing device AccuChek meter) Tower, Laine LABOR, MD  Active   acetaminophen  (TYLENOL ) 325 MG tablet [id number] Yes Take 650 mg by mouth every 6 (six) hours as needed. [provider]  Active Self  albuterol  (PROVENTIL  HFA;VENTOLIN  HFA) 108 (90 Base) MCG/ACT inhaler [id number] Yes Inhale 2 puffs into the lungs every 4 (four) hours as needed for wheezing or shortness of breath. Please give spacer if possible Tower, Laine LABOR, MD  Active Self  amLODipine  (NORVASC ) 5 MG tablet [id number] Yes TAKE 1 TABLET (5 MG TOTAL) BY MOUTH DAILY. Tower, Laine LABOR, MD  Active   atorvastatin  (LIPITOR) 10 MG tablet [id number] Yes TAKE 0.5 TABLETS (5 MG TOTAL) BY MOUTH EVERY OTHER DAY. Tower, Laine LABOR, MD  Active   blood glucose meter kit and supplies [id number] Yes To check glucose daily and prn for DM2 E11.9 Tower, Laine LABOR, MD  Active   Blood Pressure Monitoring (ADULT BLOOD PRESSURE CUFF LG) KIT [id number] Yes Blood pressure cuff size large for the arm to check blood pressure daily and as needed for HTN Tower, Laine LABOR, MD  Active   chlorthalidone  (HYGROTON ) 25 MG tablet [id number] Yes Take 1 tablet (25 mg total) by mouth daily. Tower, Laine LABOR, MD  Active   cyanocobalamin  (VITAMIN B12) 500 MCG tablet [id number] Yes Take 500 mcg by mouth daily. [provider]  Active   EPINEPHrine [facility] IJ) [id number] Yes Inject as directed as needed. Reported on 12/05/2015 [provider]  Active Self  fluticasone  (FLONASE ) 50 MCG/ACT nasal spray [id number] Yes PLACE 2 SPRAYS INTO THE NOSE DAILY. Tower, Laine LABOR, MD  Active            Med Note DORI, DAVINA E   Tue Mar 29, 2024 11:29 AM) Patient states she takes as needed.   folic acid  (FOLVITE ) 1 MG tablet [id number] Yes Take 1 mg by mouth daily. [provider]  Active Self  glipiZIDE  (GLUCOTROL   XL) 2.5 MG 24 hr tablet [id number] Yes Take 1 tablet (2.5 mg total) by mouth daily with breakfast. Tower, Laine LABOR, MD  Active   hydroxychloroquine (PLAQUENIL) 200 MG tablet [id number] Yes Take 200 mg by mouth 2 (two) times daily. [provider]  Active   loratadine  (CLARITIN ) 10 MG tablet [id number] Yes Take 10 mg by mouth daily. MAY TAKE AT [facility] ALSO [provider]  Active Self  methotrexate (RHEUMATREX) 2.5 MG tablet [id number] Yes Take 15 mg by mouth once a week. Caution:Chemotherapy. Protect from light. ( Tuesday of each week ) [provider]  Active Self  ondansetron  (ZOFRAN ) 4 MG tablet [id number] Yes Take 1 tablet (4 mg total) by mouth every 8 (eight) hours as needed for nausea or vomiting. Caution of sedation Tower, Laine LABOR, MD  Active   pantoprazole  (PROTONIX ) 40 MG tablet [id number] Yes TAKE 1 TABLET BY MOUTH EVERY DAY Tower, Laine LABOR, MD  Active   polyethylene glycol (MIRALAX  / GLYCOLAX ) packet [id number] Yes Take 17 g by mouth daily. [provider]  Active Self  potassium chloride  (KLOR-CON  M) 10 MEQ tablet [id number] Yes TAKE 1 TABLET BY MOUTH TWICE A DAY Tower, Marne A, MD  Active   triamcinolone  cream (KENALOG ) 0.1 % [id number] Yes Apply 1 Application topically 2 (two) times daily as needed. On rash/affected area Tower, Laine LABOR, MD  Active             Recommendation:   PCP Follow-up Referral to: Pharmacist for BG technique and Review of meds due to weight loss Continue Current Plan of Care  Follow Up Plan:   Telephone follow-up in 1 month  Nestora Duos, MSN, RN [facility]  [facility], [facility] RN Care Manager Direct Dial: [phone] Fax: [phone]

## 2024-06-24 NOTE — Patient Instructions (Signed)
 Visit Information  Thank you for taking time to visit with me today. Please don't hesitate to contact me if I can be of assistance to you before our next scheduled appointment.  Your next care management appointment is by telephone on 07/22/2024 at 11:00 am  Telephone follow-up in 1 month  Please call the care guide team at [phone] if you need to cancel, schedule, or reschedule an appointment.   Please call the Suicide and Crisis Lifeline: 988 call the [place]  National Suicide Prevention Lifeline: 717 560 8071 or TTY: 804-587-4990 TTY 707 572 0879) to talk to a trained counselor call 1-800-273-TALK (toll free, 24 hour hotline) go to [facility] [address], [place] [phone]) call 911 if you are experiencing a Mental Health or Behavioral Health Crisis or need someone to talk to.  Managing Depression, Adult Depression is a mental health condition that affects your thoughts, feelings, and actions. Being diagnosed with depression can bring you relief if you did not know why you have felt or behaved a certain way. It could also leave you feeling overwhelmed. Finding ways to manage your symptoms can help you feel more positive about your future. How to manage lifestyle changes Being depressed is difficult. Depression can increase the level of everyday stress. Stress can make depression symptoms worse. You may believe your symptoms cannot be managed or will never improve. However, there are many things you can try to help manage your symptoms. There is hope. Managing stress  Stress is your body's reaction to life changes and events, both good and bad. Stress can add to your feelings of depression. Learning to manage your stress can help lessen your feelings of depression. Try some of the following approaches to reducing your stress (stress reduction techniques): Listen to music that you enjoy and that inspires you. Try using a meditation app or  take a meditation class. Develop a practice that helps you connect with your spiritual self. Walk in nature, pray, or go to a place of worship. Practice deep breathing. To do this, inhale slowly through your nose. Pause at the top of your inhale for a few seconds and then exhale slowly, letting yourself relax. Repeat this three or four times. Practice yoga to help relax and work your muscles. Choose a stress reduction technique that works for you. These techniques take time and practice to develop. Set aside 5-15 minutes a day to do them. Therapists can offer training in these techniques. Do these things to help manage stress: Keep a journal. Know your limits. Set healthy boundaries for yourself and others, such as saying no when you think something is too much. Pay attention to how you react to certain situations. You may not be able to control everything, but you can change your reaction. Add humor to your life by watching funny movies or shows. Make time for activities that you enjoy and that relax you. Spend less time using electronics, especially at night before bed. The light from screens can make your brain think it is time to get up rather than go to bed.  Medicines Medicines, such as antidepressants, are often a part of treatment for depression. Talk with your pharmacist or health care provider about all the medicines, supplements, and herbal products that you take, their possible side effects, and what medicines and other products are safe to take together. Make sure to report any side effects you may have to your health care provider. Relationships Your health care provider may suggest family therapy, couples  therapy, or individual therapy as part of your treatment. How to recognize changes Everyone responds differently to treatment for depression. As you recover from depression, you may start to: Have more interest in doing activities. Feel more hopeful. Have more energy. Eat a  more regular amount of food. Have better mental focus. It is important to recognize if your depression is not getting better or is getting worse. The symptoms you had in the beginning may return, such as: Feeling tired. Eating too much or too little. Sleeping too much or too little. Feeling restless, agitated, or hopeless. Trouble focusing or making decisions. Having unexplained aches and pains. Feeling irritable, angry, or aggressive. If you or your family members notice these symptoms coming back, let your health care provider know right away. Follow these instructions at home: Activity Try to get some form of exercise each day, such as walking. Try yoga, mindfulness, or other stress reduction techniques. Participate in group activities if you are able. Lifestyle Get enough sleep. Cut down on or stop using caffeine, tobacco, alcohol, and any other harmful substances. Eat a healthy diet that includes plenty of vegetables, fruits, whole grains, low-fat dairy products, and lean protein. Limit foods that are high in solid fats, added sugar, or salt (sodium). General instructions Take over-the-counter and prescription medicines only as told by your health care provider. Keep all follow-up visits. It is important for your health care provider to check on your mood, behavior, and medicines. Your health care provider may need to make changes to your treatment. Where to find support Talking to others  Friends and family members can be sources of support and guidance. Talk to trusted friends or family members about your condition. Explain your symptoms and let them know that you are working with a health care provider to treat your depression. Tell friends and family how they can help. Finances Find mental health providers that fit with your financial situation. Talk with your health care provider if you are worried about access to food, housing, or medicine. Call your insurance company to  learn about your co-pays and prescription plan. Where to find more information You can find support in your area from: Anxiety and Depression Association of America (ADAA): adaa.org Mental Health [place]: mentalhealthamerica.net [employer] on Mental Illness: nami.org Contact a health care provider if: You stop taking your antidepressant medicines, and you have any of these symptoms: Nausea. Headache. Light-headedness. Chills and body aches. Not being able to sleep (insomnia). You or your friends and family think your depression is getting worse. Get help right away if: You have thoughts of hurting yourself or others. Get help right away if you feel like you may hurt yourself or others, or have thoughts about taking your own life. Go to your nearest emergency room or: Call 911. Call the National Suicide Prevention Lifeline at 507-610-4251 or 988. This is open 24 hours a day. Text the Crisis Text Line at 403-107-7471. This information is not intended to replace advice given to you by your health care provider. Make sure you discuss any questions you have with your health care provider. Document Revised: 02/11/2022 Document Reviewed: 02/11/2022 Elsevier Patient Education  2024 Elsevier Inc.Type 2 Diabetes Mellitus, Self-Care, Adult Caring for yourself after you have been diagnosed with type 2 diabetes (type 2 diabetes mellitus) means keeping your blood sugar (glucose) under control with a balance of: Nutrition. Exercise. Lifestyle changes. Medicines or insulin , if needed. Support from your team of health care providers and others. What are  the risks? Having type 2 diabetes can put you at risk for other long-term (chronic) conditions, such as heart disease and kidney disease. Your health care provider may prescribe medicines to help prevent complications from diabetes. How to monitor your blood glucose  Check your blood glucose every day or as often as told by your health care  provider. Have your A1C (hemoglobin A1C) level checked two or more times a year, or as often as told by your health care provider. Your health care provider will set personalized treatment goals for you. Generally, the goal of treatment is to maintain the following blood glucose levels: Before meals: 80-130 mg/dL (4.4-7.2 mmol/L). After meals: below 180 mg/dL (10 mmol/L). A1C level: less than 7%. How to manage hyperglycemia and hypoglycemia Hyperglycemia symptoms Hyperglycemia, also called high blood glucose, occurs when blood glucose is too high. Make sure you know the early signs of hyperglycemia, such as: Increased thirst. Hunger. Feeling very tired. Needing to urinate more often than usual. Blurry vision. Hypoglycemia symptoms Hypoglycemia, also called low blood glucose, occurs with a blood glucose level at or below 70 mg/dL (3.9 mmol/L). Diabetes medicines lower your blood glucose and can cause hypoglycemia. The risk for hypoglycemia increases during or after exercise, during sleep, during illness, and when skipping meals or not eating for a long time (fasting). It is important to know the symptoms of hypoglycemia and treat it right away. Always have a 15-gram rapid-acting carbohydrate snack with you to treat low blood glucose. Family members and close friends should also know the symptoms and understand how to treat hypoglycemia, in case you are not able to treat yourself. Symptoms may include: Hunger. Anxiety. Sweating and feeling clammy. Dizziness or feeling light-headed. Sleepiness. Increased heart rate. Irritability. Tingling or numbness around the mouth, lips, or tongue. Restless sleep. Severe hypoglycemia is when your blood glucose level is at or below 54 mg/dL (3 mmol/L). Severe hypoglycemia is an emergency. Do not wait to see if the symptoms will go away. Get medical help right away. Call your local emergency services (911 in the [place] Do not drive yourself to the  hospital. If you have severe hypoglycemia and you cannot eat or drink, you may need glucagon. A family member or close friend should learn how to check your blood glucose and how to give you glucagon. Ask your health care provider if you need to have an emergency glucagon kit available. Follow these instructions at home: Medicines Take prescribed insulin  or diabetes medicines as told by your health care provider. Do not run out of insulin  or other diabetes medicines. Plan ahead so you always have these available. If you use insulin , adjust your dosage based on your physical activity and what foods you eat. Your health care provider will tell you how to adjust your dosage. Take over-the-counter and prescription medicines only as told by your health care provider. Eating and drinking  What you eat and drink affects your blood glucose and your insulin  dosage. Making good choices helps to control your diabetes and prevent other health problems. A healthy meal plan includes eating lean proteins, complex carbohydrates, fresh fruits and vegetables, low-fat dairy products, and healthy fats. Make an appointment to see a registered dietitian to help you create an eating plan that is right for you. Make sure that you: Follow instructions from your health care provider about eating or drinking restrictions. Drink enough fluid to keep your urine pale yellow. Keep a record of the carbohydrates that you eat. Do this by  reading food labels and learning the standard serving sizes of foods. Follow your sick-day plan whenever you cannot eat or drink as usual. Make this plan in advance with your health care provider.  Activity Stay active. Exercise regularly, as told by your health care provider. This may include: Stretching and doing strength exercises, such as yoga or weight lifting, two or more times a week. Doing 150 minutes or more of moderate-intensity or vigorous-intensity exercise each week. This could be  brisk walking, biking, or water aerobics. Spread out your activity over 3 or more days of the week. Do not go more than 2 days in a row without doing some kind of physical activity. When you start a new exercise or activity, work with your health care provider to adjust your insulin , medicines, or food intake as needed. Lifestyle Do not use any products that contain nicotine or tobacco. These products include cigarettes, chewing tobacco, and vaping devices, such as e-cigarettes. If you need help quitting, ask your health care provider. If you drink alcohol and your health care provider says that it is safe for you: Limit how much you have to: 0-1 drink a day for women who are not pregnant. 0-2 drinks a day for men. Know how much alcohol is in your drink. In the [place] one drink equals one 12 oz bottle of beer (355 mL), one 5 oz glass of wine (148 mL), or one 1 oz glass of hard liquor (44 mL). Learn to manage stress. If you need help with this, ask your health care provider. Take care of your body  Keep your immunizations up to date. In addition to getting vaccinations as told by your health care provider, it is recommended that you get vaccinated against the following illnesses: The flu (influenza). Get a flu shot every year. Pneumonia. Hepatitis B. Schedule an eye exam soon after your diagnosis, and then one time every year after that. Check your skin and feet every day for cuts, bruises, redness, blisters, or sores. Schedule a foot exam with your health care provider once every year. Brush your teeth and gums two times a day, and floss one or more times a day. Visit your dentist one or more times every 6 months. Maintain a healthy weight. General instructions Share your diabetes management plan with people in your workplace, school, and household. Carry a medical alert card or wear medical alert jewelry. Keep all follow-up visits. This is important. Questions to ask your health care  provider Should I meet with a certified diabetes care and education specialist? Where can I find a support group for people with diabetes? Where to find more information For help and guidance and for more information about diabetes, please visit: American Diabetes Association (ADA): www.diabetes.org American Association of Diabetes Care and Education Specialists (ADCES): www.diabeteseducator.org International Diabetes Federation (IDF): DCOnly.dk Summary Caring for yourself after you have been diagnosed with type 2 diabetes (type 2 diabetes mellitus) means keeping your blood sugar (glucose) under control with a balance of nutrition, exercise, lifestyle changes, and medicine. Check your blood glucose every day, as often as told by your health care provider. Having diabetes can put you at risk for other long-term (chronic) conditions, such as heart disease and kidney disease. Your health care provider may prescribe medicines to help prevent complications from diabetes. Share your diabetes management plan with people in your workplace, school, and household. Keep all follow-up visits. This is important. This information is not intended to replace advice given to you by  your health care provider. Make sure you discuss any questions you have with your health care provider. Document Revised: 03/06/2021 Document Reviewed: 03/06/2021 Elsevier Patient Education  2024 Elsevier Inc. Nestora Duos, MSN, RN [facility]  [facility], [facility] RN Care Manager Direct Dial: [phone] Fax: [phone]

## 2024-06-25 DIAGNOSIS — K5730 Diverticulosis of large intestine without perforation or abscess without bleeding: Secondary | ICD-10-CM | POA: Diagnosis not present

## 2024-06-25 DIAGNOSIS — Z9049 Acquired absence of other specified parts of digestive tract: Secondary | ICD-10-CM | POA: Diagnosis not present

## 2024-06-25 DIAGNOSIS — Z87442 Personal history of urinary calculi: Secondary | ICD-10-CM | POA: Diagnosis not present

## 2024-06-25 DIAGNOSIS — M06059 Rheumatoid arthritis without rheumatoid factor, unspecified hip: Secondary | ICD-10-CM | POA: Diagnosis not present

## 2024-06-25 DIAGNOSIS — I2510 Atherosclerotic heart disease of native coronary artery without angina pectoris: Secondary | ICD-10-CM | POA: Diagnosis not present

## 2024-06-25 DIAGNOSIS — E785 Hyperlipidemia, unspecified: Secondary | ICD-10-CM | POA: Diagnosis not present

## 2024-06-25 DIAGNOSIS — Z860100 Personal history of colon polyps, unspecified: Secondary | ICD-10-CM | POA: Diagnosis not present

## 2024-06-25 DIAGNOSIS — Z87440 Personal history of urinary (tract) infections: Secondary | ICD-10-CM | POA: Diagnosis not present

## 2024-06-25 DIAGNOSIS — D649 Anemia, unspecified: Secondary | ICD-10-CM | POA: Diagnosis not present

## 2024-06-25 DIAGNOSIS — J309 Allergic rhinitis, unspecified: Secondary | ICD-10-CM | POA: Diagnosis not present

## 2024-06-25 DIAGNOSIS — K219 Gastro-esophageal reflux disease without esophagitis: Secondary | ICD-10-CM | POA: Diagnosis not present

## 2024-06-25 DIAGNOSIS — Z556 Problems related to health literacy: Secondary | ICD-10-CM | POA: Diagnosis not present

## 2024-06-25 DIAGNOSIS — E1169 Type 2 diabetes mellitus with other specified complication: Secondary | ICD-10-CM | POA: Diagnosis not present

## 2024-06-25 DIAGNOSIS — H269 Unspecified cataract: Secondary | ICD-10-CM | POA: Diagnosis not present

## 2024-06-25 DIAGNOSIS — Z87891 Personal history of nicotine dependence: Secondary | ICD-10-CM | POA: Diagnosis not present

## 2024-06-25 DIAGNOSIS — R1310 Dysphagia, unspecified: Secondary | ICD-10-CM | POA: Diagnosis not present

## 2024-06-25 DIAGNOSIS — K649 Unspecified hemorrhoids: Secondary | ICD-10-CM | POA: Diagnosis not present

## 2024-06-25 DIAGNOSIS — K760 Fatty (change of) liver, not elsewhere classified: Secondary | ICD-10-CM | POA: Diagnosis not present

## 2024-06-25 DIAGNOSIS — I10 Essential (primary) hypertension: Secondary | ICD-10-CM | POA: Diagnosis not present

## 2024-06-25 DIAGNOSIS — M06061 Rheumatoid arthritis without rheumatoid factor, right knee: Secondary | ICD-10-CM | POA: Diagnosis not present

## 2024-06-25 DIAGNOSIS — I700 Atherosclerosis of aorta: Secondary | ICD-10-CM | POA: Diagnosis not present

## 2024-06-25 DIAGNOSIS — M1990 Unspecified osteoarthritis, unspecified site: Secondary | ICD-10-CM | POA: Diagnosis not present

## 2024-06-27 ENCOUNTER — Telehealth: Payer: Self-pay

## 2024-06-27 NOTE — Progress Notes (Signed)
 Care Guide Pharmacy Note  06/27/2024 Name: NABILA ALBARRACIN MRN: [id number] DOB: 20-Oct-1941  Referred By: Randeen Laine LABOR, MD Reason for referral: Complex Care Management and Call Attempt #1 (Successful initial outreach scheduled with PHARM D- Manuelita)   JAQUILLA WOODROOF is a 83 y.o. year old female who is a primary care patient of Tower, Laine LABOR, MD.  ARDELIA WREDE was referred to the pharmacist for assistance related to: DMII  Successful contact was made with the patient to discuss pharmacy services including being ready for the pharmacist to call at least 5 minutes before the scheduled appointment time and to have medication bottles and any blood pressure readings ready for review. The patient agreed to meet with the pharmacist via telephone visit on (date/time). 07/07/24 @ 11 am.   Leotis Rase [facility], [facility] Guide  Direct Dial: [phone]  Fax [phone]

## 2024-06-30 DIAGNOSIS — Z556 Problems related to health literacy: Secondary | ICD-10-CM | POA: Diagnosis not present

## 2024-06-30 DIAGNOSIS — I10 Essential (primary) hypertension: Secondary | ICD-10-CM | POA: Diagnosis not present

## 2024-06-30 DIAGNOSIS — I700 Atherosclerosis of aorta: Secondary | ICD-10-CM | POA: Diagnosis not present

## 2024-06-30 DIAGNOSIS — D649 Anemia, unspecified: Secondary | ICD-10-CM | POA: Diagnosis not present

## 2024-06-30 DIAGNOSIS — K760 Fatty (change of) liver, not elsewhere classified: Secondary | ICD-10-CM | POA: Diagnosis not present

## 2024-06-30 DIAGNOSIS — M1990 Unspecified osteoarthritis, unspecified site: Secondary | ICD-10-CM | POA: Diagnosis not present

## 2024-06-30 DIAGNOSIS — Z9049 Acquired absence of other specified parts of digestive tract: Secondary | ICD-10-CM | POA: Diagnosis not present

## 2024-06-30 DIAGNOSIS — Z87442 Personal history of urinary calculi: Secondary | ICD-10-CM | POA: Diagnosis not present

## 2024-06-30 DIAGNOSIS — Z87891 Personal history of nicotine dependence: Secondary | ICD-10-CM | POA: Diagnosis not present

## 2024-06-30 DIAGNOSIS — Z87440 Personal history of urinary (tract) infections: Secondary | ICD-10-CM | POA: Diagnosis not present

## 2024-06-30 DIAGNOSIS — R1310 Dysphagia, unspecified: Secondary | ICD-10-CM | POA: Diagnosis not present

## 2024-06-30 DIAGNOSIS — M06059 Rheumatoid arthritis without rheumatoid factor, unspecified hip: Secondary | ICD-10-CM | POA: Diagnosis not present

## 2024-06-30 DIAGNOSIS — H269 Unspecified cataract: Secondary | ICD-10-CM | POA: Diagnosis not present

## 2024-06-30 DIAGNOSIS — K5730 Diverticulosis of large intestine without perforation or abscess without bleeding: Secondary | ICD-10-CM | POA: Diagnosis not present

## 2024-06-30 DIAGNOSIS — M06061 Rheumatoid arthritis without rheumatoid factor, right knee: Secondary | ICD-10-CM | POA: Diagnosis not present

## 2024-06-30 DIAGNOSIS — K649 Unspecified hemorrhoids: Secondary | ICD-10-CM | POA: Diagnosis not present

## 2024-06-30 DIAGNOSIS — E1169 Type 2 diabetes mellitus with other specified complication: Secondary | ICD-10-CM | POA: Diagnosis not present

## 2024-06-30 DIAGNOSIS — J309 Allergic rhinitis, unspecified: Secondary | ICD-10-CM | POA: Diagnosis not present

## 2024-06-30 DIAGNOSIS — I2510 Atherosclerotic heart disease of native coronary artery without angina pectoris: Secondary | ICD-10-CM | POA: Diagnosis not present

## 2024-06-30 DIAGNOSIS — K219 Gastro-esophageal reflux disease without esophagitis: Secondary | ICD-10-CM | POA: Diagnosis not present

## 2024-06-30 DIAGNOSIS — E785 Hyperlipidemia, unspecified: Secondary | ICD-10-CM | POA: Diagnosis not present

## 2024-06-30 DIAGNOSIS — Z860100 Personal history of colon polyps, unspecified: Secondary | ICD-10-CM | POA: Diagnosis not present

## 2024-07-06 NOTE — Progress Notes (Deleted)
 07/06/2024 Name: [name] MRN: [id number] DOB: 06/30/1941  Subjective  No chief complaint on file.   Reason for visit: ?  [name] is a 83 y.o. female with a history of diabetes (type 2), who presents today for an initial diabetes pharmacotherapy visit.? Pertinent PMH also includes ***.  Known DM Complications: {Blank multiple:19197::retinopathy,peripheral neuropathy,nephropathy,hypoglycemia unawareness,severe hypoglycemia,DKA,autonomic neuropathy,gastroparesis,amputation,significant fear of hypoglycemia,no known complications}   Care Team: Primary Care Provider: Tower, Laine LABOR, MD   Date of Last Diabetes Related Visit: {LZ with PCP with pharmacist:31532}  Recent Summary of Change: Referral to PharmD >> assist with managing DM, difficulty checking BG.   Medication Access/Adherence: Prescription drug coverage: Payor: Advertising copywriter MEDICARE / Plan: UHC MEDICARE / Product Type: *No Product type* / .  - Reports that all medications are *** affordable.  - Current Patient Assistance: None*** - Medication Adherence: Patient denies*** missing doses of their medication.    Since Last visit / History of Present Illness: ?  Patient previously seen regarding evaluation of medications in the setting of reduced appetite/undesired weight loss. She states her appetite comes and goes. Some days does not eat much, other days may have a couple of meals. Reports drinking 1-2 nutritional drinks daily (Boost). Notes her husband keeps her on track, encourages her to eat.   Regarding diabetes, patient reports ***.  ***Difficulty checking blood sugar.   Reported DM Regimen: ?  Glipizide  XL 2.5 mg once daily  ***   DM medications tried in the past:?  *** (***) *** (***) *** (***)  SMBG: *** ?  ***   Hypo/Hyperglycemia: ?  Symptoms of hypoglycemia since last visit:? {Blank multiple:19197::yes,no,n/a,***}  Symptoms of hyperglycemia since last  visit:? {Blank multiple:19197::yes,no,n/a,***} - {symptoms; hyperglycemia:17903}  Reported Diet: Patient typically eats *** meals per day.  Breakfast: *** Lunch: *** Dinner: *** Snacks: *** Beverages: ***  Exercise: ***  DM Prevention:  Statin: {Blank single:19197::***,Taking,Not taking,Intolerant to,Declines}; {Blank single:19197::low intensity,moderate intensity,high intensity,n/a}.?  History of chronic kidney disease? {Blank single:19197::yes,no} History of albuminuria? {Blank single:19197::yes,no}, last UACR on *** = *** mg/g Lab Results  Component Value Date   MICRALBCREAT 11.3 12/03/2023   ACE/ARB - {Blank single:19197::***,Taking,Not taking,Intolerant to,Declines} ***; Urine MA/CR Ratio - {Blank single:19197::normal,elevated urinary albumin excretion,n/a,***}.  Last eye exam: ***; {lzdmeyeexam:31121} Lab Results  Component Value Date   HMDIABEYEEXA No Retinopathy 05/07/2023   Last foot exam: 07/22/2022 Tobacco Use: ***  Tobacco Use: Medium Risk (06/03/2024)   Patient History    Smoking Tobacco Use: Former    Smokeless Tobacco Use: Never    Passive Exposure: Not on file   Immunizations:? Flu: {LZDUEUPTODATE:31033} (Last: 12/02/2023); Pneumococcal: {LZVAXpneumococcal:31032:::0} - {LZVAXpneumo RISKAge19-64:31093}; Shingrix: {LZDUEUPTODATE:31033} (Last: , 11/12/2022); Covid (***)  Cardiovascular Risk Reduction History of clinical ASCVD? {Blank single:19197::yes,no} The ASCVD Risk score (Arnett DK, et al., 2019) failed to calculate for the following reasons:   The 2019 ASCVD risk score is only valid for ages 16 to 33 History of heart failure? {Blank single:19197::yes,no} History of hyperlipidemia? {Blank single:19197::yes,no} Current BMI: *** kg/m2 (Ht *** in, Wt *** kg) Taking statin? {Blank single:19197::yes,no,intolerant,declines}; {Blank single:19197::low intensity,moderate intensity,high  intensity,n/a} (***) Taking aspirin? {Blank single:19197::indicated (primary prevention),indicated (secondary prevention),not indicated,unclear if indicated}; {Blank single:19197::***,Taking,Not taking,Intolerant to,Declines}   Taking SGLT-2i? {Blank single:19197::yes,no} Taking GLP- 1 RA? {Blank single:19197::yes,no}      _______________________________________________  Objective    Review of Systems:? Limited in the setting of virtual visit *** Constitutional:? No fever, chills or unintentional weight loss  Cardiovascular:? No chest pain or pressure,  shortness of breath, dyspnea on exertion, orthopnea or LE edema  Pulmonary:? No cough or shortness of breath  GI:? No nausea, vomiting, constipation, diarrhea, abdominal pain, dyspepsia, change in bowel habits  Endocrine:? No polyuria, polyphagia or blurred vision    Physical Examination:  Vitals:  Wt Readings from Last 3 Encounters:  03/29/24 153 lb (69.4 kg)  03/03/24 153 lb (69.4 kg)  02/02/24 154 lb 6 oz (70 kg)   BP Readings from Last 3 Encounters:  06/03/24 136/84  03/03/24 134/69  02/02/24 (!) 145/70   Pulse Readings from Last 3 Encounters:  06/03/24 84  02/02/24 64  12/02/23 66     Labs:?  Lab Results  Component Value Date   HGBA1C 6.1 12/02/2023   HGBA1C 6.8 (H) 05/12/2023   HGBA1C 7.2 (H) 02/06/2023   GLUCOSE 86 03/23/2024   MICRALBCREAT 11.3 12/03/2023   CREATININE 0.63 03/23/2024   CREATININE 0.78 09/29/2023   CREATININE 0.83 09/04/2023   GFR 82.93 03/23/2024   GFR 71.24 09/29/2023   GFR 66.16 09/04/2023    Lab Results  Component Value Date   CHOL 157 12/02/2023   LDLCALC 76 12/02/2023   LDLCALC 67 02/06/2023   LDLCALC 64 01/17/2022   LDLDIRECT 60.0 10/26/2018   HDL 34.10 (L) 12/02/2023   TRIG 234.0 (H) 12/02/2023   TRIG 189 (H) 02/06/2023   TRIG 136.0 01/17/2022   ALT 10 08/26/2023   ALT 8 05/27/2023   AST 28 08/26/2023   AST 28 05/27/2023      Chemistry       Component Value Date/Time   NA 139 03/23/2024 1352   K 3.7 03/23/2024 1352   CL 103 03/23/2024 1352   CO2 28 03/23/2024 1352   BUN 9 03/23/2024 1352   CREATININE 0.63 03/23/2024 1352   CREATININE 0.77 02/06/2023 1622      Component Value Date/Time   CALCIUM  9.2 03/23/2024 1352   ALKPHOS 64 08/26/2023 1141   AST 28 08/26/2023 1141   ALT 10 08/26/2023 1141   BILITOT 0.6 08/26/2023 1141       The ASCVD Risk score (Arnett DK, et al., 2019) failed to calculate for the following reasons:   The 2019 ASCVD risk score is only valid for ages 19 to 29  Assessment and Plan:   1. Diabetes, {Blank single:19197::type 1,type 2,LADA,MODY,post-transplant}: {CHL Controlled/Uncontrolled:[phone]} per last A1c of ***% (***), {Increased/decreased/na:15831} from previous. Goal <7% without hypoglycemia.  {lzcgmsmbg:31042} data shows ***?. Current Regimen: *** Diet: *** Exercise: ***  HCM: {LZ HCM DM2:31604} Continue medications today without changes.  ***  Reviewed s/sx/tx hypoglycemia Future Consideration: GLP1-RA: *** SGLT2i: *** Metformin : {LZ AP metformin :31605}  SU: Not unreasonable, though defer given alternative options with lower risk of hypoglycemia.  TZD: Avoiding due to possible weight gain/increase in fracture risk. ***HF Insulin : Not unreasonable, though defer as last resort or as needed for severe hyperglycemia given risk hypoglycemia. Other safer options at this time.     2. HTN: {CHL Controlled/Uncontrolled:[phone]} based on last clinic BP of *** mmHg (***), goal <130/80 mmHg. Does not *** monitor BP at home. Denies lightheadedness, dizziness, SOB, CP, vision changes.  Current Regimen: *** Continue medications without changes.  BP Readings from Last 3 Encounters:  06/03/24 136/84  03/03/24 134/69  02/02/24 (!) 145/70     3. ASCVD (*** prevention): {CHL Controlled/Uncontrolled:[phone]} on last lipid panel with LDL *** mg/dL, TG *** mg/dL (***). LDL goal  {ldl goal:31310}.  Key risk factors include: {cvs risk:31001} The ASCVD Risk score (Arnett DK,  et al., 2019) failed to calculate for the following reasons:   The 2019 ASCVD risk score is only valid for ages 55 to 76 indicated patient is at *** risk.  PREVENT: 10-Yr CAD ***%, 30-Yr CAD ***% indicated patient is at *** risk.  (those without ASCVD, CHF), Age 72-79 (includes additional metabolic risk factors, BMI, GFR, UACR, no race) Current Regimen: *** mg daily Continue medications today without changes.  Lab Results  Component Value Date   LDLCALC 76 12/02/2023   LDLCALC 67 02/06/2023   LDLDIRECT 60.0 10/26/2018   TRIG 234.0 (H) 12/02/2023   TRIG 189 (H) 02/06/2023     4. Healthcare Maintenance:  Pneumococcal - Current status: ***  Shingles - Current status: *** Influenza - Current status: ***  Due to receive the following vaccines: {LZAPIMMUNIZATION:31028}   Follow Up Follow up with clinical pharmacist via *** in *** weeks Patient given direct line for questions regarding medication therapy  Future Appointments  Date Time Provider Department Center  07/07/2024 11:00 AM [facility] PHARMACIST [facility] [address]  07/22/2024 11:00 AM Devra Lands, RN [facility] None  11/17/2024  3:00 PM [facility] ANNUAL WELLNESS VISIT 1 [facility] [address]    [name], PharmD Clinical Pharmacist [facility] [phone]

## 2024-07-07 ENCOUNTER — Other Ambulatory Visit

## 2024-07-07 DIAGNOSIS — J309 Allergic rhinitis, unspecified: Secondary | ICD-10-CM | POA: Diagnosis not present

## 2024-07-07 DIAGNOSIS — D649 Anemia, unspecified: Secondary | ICD-10-CM | POA: Diagnosis not present

## 2024-07-07 DIAGNOSIS — I2510 Atherosclerotic heart disease of native coronary artery without angina pectoris: Secondary | ICD-10-CM | POA: Diagnosis not present

## 2024-07-07 DIAGNOSIS — E1169 Type 2 diabetes mellitus with other specified complication: Secondary | ICD-10-CM | POA: Diagnosis not present

## 2024-07-07 DIAGNOSIS — D259 Leiomyoma of uterus, unspecified: Secondary | ICD-10-CM

## 2024-07-07 DIAGNOSIS — M06059 Rheumatoid arthritis without rheumatoid factor, unspecified hip: Secondary | ICD-10-CM | POA: Diagnosis not present

## 2024-07-07 DIAGNOSIS — M06061 Rheumatoid arthritis without rheumatoid factor, right knee: Secondary | ICD-10-CM | POA: Diagnosis not present

## 2024-07-07 DIAGNOSIS — E2839 Other primary ovarian failure: Secondary | ICD-10-CM

## 2024-07-07 DIAGNOSIS — I10 Essential (primary) hypertension: Secondary | ICD-10-CM | POA: Diagnosis not present

## 2024-07-07 DIAGNOSIS — K219 Gastro-esophageal reflux disease without esophagitis: Secondary | ICD-10-CM | POA: Diagnosis not present

## 2024-07-07 DIAGNOSIS — Z556 Problems related to health literacy: Secondary | ICD-10-CM | POA: Diagnosis not present

## 2024-07-07 DIAGNOSIS — R1310 Dysphagia, unspecified: Secondary | ICD-10-CM | POA: Diagnosis not present

## 2024-07-07 DIAGNOSIS — K649 Unspecified hemorrhoids: Secondary | ICD-10-CM | POA: Diagnosis not present

## 2024-07-07 DIAGNOSIS — Z9049 Acquired absence of other specified parts of digestive tract: Secondary | ICD-10-CM | POA: Diagnosis not present

## 2024-07-07 DIAGNOSIS — I700 Atherosclerosis of aorta: Secondary | ICD-10-CM | POA: Diagnosis not present

## 2024-07-07 DIAGNOSIS — Z87440 Personal history of urinary (tract) infections: Secondary | ICD-10-CM | POA: Diagnosis not present

## 2024-07-07 DIAGNOSIS — Z87891 Personal history of nicotine dependence: Secondary | ICD-10-CM | POA: Diagnosis not present

## 2024-07-07 DIAGNOSIS — K760 Fatty (change of) liver, not elsewhere classified: Secondary | ICD-10-CM | POA: Diagnosis not present

## 2024-07-07 DIAGNOSIS — H269 Unspecified cataract: Secondary | ICD-10-CM | POA: Diagnosis not present

## 2024-07-07 DIAGNOSIS — E785 Hyperlipidemia, unspecified: Secondary | ICD-10-CM | POA: Diagnosis not present

## 2024-07-07 DIAGNOSIS — Z860100 Personal history of colon polyps, unspecified: Secondary | ICD-10-CM | POA: Diagnosis not present

## 2024-07-07 DIAGNOSIS — E119 Type 2 diabetes mellitus without complications: Secondary | ICD-10-CM

## 2024-07-07 DIAGNOSIS — K5730 Diverticulosis of large intestine without perforation or abscess without bleeding: Secondary | ICD-10-CM | POA: Diagnosis not present

## 2024-07-07 DIAGNOSIS — Z87442 Personal history of urinary calculi: Secondary | ICD-10-CM | POA: Diagnosis not present

## 2024-07-07 DIAGNOSIS — M1990 Unspecified osteoarthritis, unspecified site: Secondary | ICD-10-CM | POA: Diagnosis not present

## 2024-07-07 NOTE — Progress Notes (Unsigned)
 07/07/2024 Name: [name] MRN: [id number] DOB: 1941/08/01  Subjective  No chief complaint on file.   Reason for visit: ?  [name] is a 83 y.o. female with a history of diabetes (type 2), who presents today for an initial diabetes pharmacotherapy visit.? Pertinent PMH also includes ***.  Known DM Complications: {Blank multiple:19197::retinopathy,peripheral neuropathy,nephropathy,hypoglycemia unawareness,severe hypoglycemia,DKA,autonomic neuropathy,gastroparesis,amputation,significant fear of hypoglycemia,no known complications}   Care Team: Primary Care Provider: Tower, Laine LABOR, MD   Date of Last Diabetes Related Visit: {LZ with PCP with pharmacist:31532}  Recent Summary of Change: Referral to PharmD >> assist with managing DM, difficulty checking BG.   Medication Access/Adherence: Prescription drug coverage: Payor: Advertising copywriter MEDICARE / Plan: UHC MEDICARE / Product Type: *No Product type* / .  - Reports that all medications are *** affordable.  - Current Patient Assistance: None*** - Medication Adherence: Patient denies*** missing doses of their medication.    Since Last visit / History of Present Illness: ?  Patient previously seen regarding evaluation of medications in the setting of reduced appetite/undesired weight loss. She states her appetite comes and goes. Some days does not eat much, other days may have a couple of meals. Reports drinking 1-2 nutritional drinks daily (Boost). Notes her husband keeps her on track, encourages her to eat.   Upon med review: Has 4 atorvastatin  bottles one states 1 tab/d one states 0.5 tablet/d, one states 0.5 tab every, one states 1 tab every other day. 3 hydroxychloroquine bottles.  No glipizide  at home though will look around for it.   Fills pill box once per week. Fills based on last med list printed at PCP visit.   Regarding diabetes, patient reports ***.  ***Difficulty checking blood  sugar.   Reported DM Regimen: ?  Glipizide  XL 2.5 mg once daily     DM medications tried in the past:?  *** (***) *** (***) *** (***)  SMBG: glucometer (states cannot get enough blood on the meter) ?  ***   Hypo/Hyperglycemia: ?  Symptoms of hypoglycemia since last visit:? {Blank multiple:19197::yes,no,n/a,***}  Symptoms of hyperglycemia since last visit:? {Blank multiple:19197::yes,no,n/a,***} - {symptoms; hyperglycemia:17903}  Reported Diet: Patient typically eats *** meals per day.  Breakfast: *** Lunch: *** Dinner: *** Snacks: *** Beverages: ***  Exercise: ***  DM Prevention:  Statin: {Blank single:19197::***,Taking,Not taking,Intolerant to,Declines}; {Blank single:19197::low intensity,moderate intensity,high intensity,n/a}.?  History of chronic kidney disease? {Blank single:19197::yes,no} History of albuminuria? {Blank single:19197::yes,no}, last UACR on *** = *** mg/g Lab Results  Component Value Date   MICRALBCREAT 11.3 12/03/2023   ACE/ARB - {Blank single:19197::***,Taking,Not taking,Intolerant to,Declines} ***; Urine MA/CR Ratio - {Blank single:19197::normal,elevated urinary albumin excretion,n/a,***}.  Last eye exam: ***; {lzdmeyeexam:31121} Lab Results  Component Value Date   HMDIABEYEEXA No Retinopathy 05/07/2023   Last foot exam: 07/22/2022 Tobacco Use: ***  Tobacco Use: Medium Risk (06/03/2024)   Patient History    Smoking Tobacco Use: Former    Smokeless Tobacco Use: Never    Passive Exposure: Not on file   Immunizations:? Flu: {LZDUEUPTODATE:31033} (Last: 12/02/2023); Pneumococcal: {LZVAXpneumococcal:31032:::0} - {LZVAXpneumo RISKAge19-64:31093}; Shingrix: {LZDUEUPTODATE:31033} (Last: , 11/12/2022); Covid (***)  Cardiovascular Risk Reduction History of clinical ASCVD? {Blank single:19197::yes,no} The ASCVD Risk score (Arnett DK, et al., 2019) failed to calculate for the following  reasons:   The 2019 ASCVD risk score is only valid for ages 59 to 64 History of heart failure? {Blank single:19197::yes,no} History of hyperlipidemia? {Blank single:19197::yes,no} Current BMI: *** kg/m2 (Ht *** in, Wt *** kg) Taking statin? {Blank single:19197::yes,no,intolerant,declines}; {Blank  single:19197::low intensity,moderate intensity,high intensity,n/a} (***) Taking aspirin? {Blank single:19197::indicated (primary prevention),indicated (secondary prevention),not indicated,unclear if indicated}; {Blank single:19197::***,Taking,Not taking,Intolerant to,Declines}   Taking SGLT-2i? {Blank single:19197::yes,no} Taking GLP- 1 RA? {Blank single:19197::yes,no}      _______________________________________________  Objective    Review of Systems:? Limited in the setting of virtual visit *** Constitutional:? No fever, chills or unintentional weight loss  Cardiovascular:? No chest pain or pressure, shortness of breath, dyspnea on exertion, orthopnea or LE edema  Pulmonary:? No cough or shortness of breath  GI:? No nausea, vomiting, constipation, diarrhea, abdominal pain, dyspepsia, change in bowel habits  Endocrine:? No polyuria, polyphagia or blurred vision    Physical Examination:  Vitals:  Wt Readings from Last 3 Encounters:  03/29/24 153 lb (69.4 kg)  03/03/24 153 lb (69.4 kg)  02/02/24 154 lb 6 oz (70 kg)   BP Readings from Last 3 Encounters:  06/03/24 136/84  03/03/24 134/69  02/02/24 (!) 145/70   Pulse Readings from Last 3 Encounters:  06/03/24 84  02/02/24 64  12/02/23 66     Labs:?  Lab Results  Component Value Date   HGBA1C 6.1 12/02/2023   HGBA1C 6.8 (H) 05/12/2023   HGBA1C 7.2 (H) 02/06/2023   GLUCOSE 86 03/23/2024   MICRALBCREAT 11.3 12/03/2023   CREATININE 0.63 03/23/2024   CREATININE 0.78 09/29/2023   CREATININE 0.83 09/04/2023   GFR 82.93 03/23/2024   GFR 71.24 09/29/2023   GFR 66.16 09/04/2023    Lab  Results  Component Value Date   CHOL 157 12/02/2023   LDLCALC 76 12/02/2023   LDLCALC 67 02/06/2023   LDLCALC 64 01/17/2022   LDLDIRECT 60.0 10/26/2018   HDL 34.10 (L) 12/02/2023   TRIG 234.0 (H) 12/02/2023   TRIG 189 (H) 02/06/2023   TRIG 136.0 01/17/2022   ALT 10 08/26/2023   ALT 8 05/27/2023   AST 28 08/26/2023   AST 28 05/27/2023      Chemistry      Component Value Date/Time   NA 139 03/23/2024 1352   K 3.7 03/23/2024 1352   CL 103 03/23/2024 1352   CO2 28 03/23/2024 1352   BUN 9 03/23/2024 1352   CREATININE 0.63 03/23/2024 1352   CREATININE 0.77 02/06/2023 1622      Component Value Date/Time   CALCIUM  9.2 03/23/2024 1352   ALKPHOS 64 08/26/2023 1141   AST 28 08/26/2023 1141   ALT 10 08/26/2023 1141   BILITOT 0.6 08/26/2023 1141       The ASCVD Risk score (Arnett DK, et al., 2019) failed to calculate for the following reasons:   The 2019 ASCVD risk score is only valid for ages 25 to 44  Assessment and Plan:   1. Diabetes, type 2: controlled per last A1c of 6.1% (12/02/23), though unclear if patient is taking diabetes medication currently/how long she has been out of glipizide . Also notes she has difficulty with getting her lancet to draw blood so does not keep a blood sugar log. Would be interested in office visit to go through her testing supplies. Suspect lancet depth setting is too low, or different lancing device needed. Could also try for single-use lancet device rather than the re-loadable one.  Current Regimen: None Vs. Glipizide  XL 2.5 mg daily?  Diet: low appetite, ongoing issue.  Exercise: Physical therapy ***   *** ***  Future Consideration: GLP1-RA: avoid in setting of baseline low appetite/weight loss SGLT2i: *** Metformin : {LZ AP metformin :31605}  SU: Not unreasonable, though avoid if skipping meals TZD: Avoiding due  to possible increase in fracture risk.     2. HTN: {CHL Controlled/Uncontrolled:[phone]} based on last clinic BP of *** mmHg  (***), goal <130/80 mmHg. Does not *** monitor BP at home. Denies lightheadedness, dizziness, SOB, CP, vision changes.  Current Regimen: *** - Interested in new blood pressure cuff Continue medications without changes.  Medicare coverage of BP cuff? *** BP Readings from Last 3 Encounters:  06/03/24 136/84  03/03/24 134/69  02/02/24 (!) 145/70    3. ASCVD (*** prevention): {CHL Controlled/Uncontrolled:[phone]} on last lipid panel with LDL *** mg/dL, TG *** mg/dL (***). LDL goal {ldl goal:31310}.  Key risk factors include: {cvs risk:31001} The ASCVD Risk score (Arnett DK, et al., 2019) failed to calculate for the following reasons:   The 2019 ASCVD risk score is only valid for ages 84 to 59 indicated patient is at *** risk.  PREVENT: 10-Yr CAD ***%, 30-Yr CAD ***% indicated patient is at *** risk.  (those without ASCVD, CHF), Age 58-79 (includes additional metabolic risk factors, BMI, GFR, UACR, no race) Current Regimen: *** mg daily Continue medications today without changes.  Lab Results  Component Value Date   LDLCALC 76 12/02/2023   LDLCALC 67 02/06/2023   LDLDIRECT 60.0 10/26/2018   TRIG 234.0 (H) 12/02/2023   TRIG 189 (H) 02/06/2023    4. Healthcare Maintenance:  Pneumococcal - Current status: Complete (PPSV23, PCV13 s/p age 97). Risk/benefit of additional PCV20.   Shingles - Current status: Unclear - one dose recorded 11/12/22.  Influenza - Current status: Due for 2025-2026 vaccine    Follow Up Patient requests phone call in 1 week so she can speak to husband about available days to schedule office visit Patient given direct line for questions regarding medication therapy  Future Appointments  Date Time Provider Department Center  07/07/2024  3:00 PM [facility] PHARMACIST [facility] [address]  07/22/2024 11:00 AM Devra Lands, RN [facility] None  11/17/2024  3:00 PM [facility] ANNUAL WELLNESS VISIT 1 [facility] [address]    [name], PharmD Clinical Pharmacist [facility] [phone]

## 2024-07-15 ENCOUNTER — Telehealth: Payer: Self-pay | Admitting: Pharmacist

## 2024-07-15 DIAGNOSIS — E119 Type 2 diabetes mellitus without complications: Secondary | ICD-10-CM

## 2024-07-15 NOTE — Telephone Encounter (Signed)
 Patient would like to schedule visit to review glucometer technique. Already referred to pharmacist for DM management.   She continues to have issues with her lancing device drawing blood. Suspect she may need the depth setting adjusted to a deeper setting, or larger lancets.   Patient agreeable to visit in office. Afternoons are best (husband mows during the week as his job)/   Patient verbalizes understanding of importance of bringing all testing supplies with her (lancets, test strips, glucometer). States she has a good supply of all of the above at home currently.   Future Appointments  Date Time Provider Department Center  07/20/2024  2:00 PM [facility] [place] PHARMACIST [facility] [address]  07/22/2024 11:00 AM Devra Lands, RN [facility] None  11/17/2024  3:00 PM [facility] ANNUAL WELLNESS VISIT 1 [facility] [address]

## 2024-07-18 ENCOUNTER — Telehealth: Payer: Self-pay | Admitting: Family Medicine

## 2024-07-18 NOTE — Telephone Encounter (Signed)
 Copied from CRM #[id number]. Topic: Appointments - Appointment Cancel/Reschedule >> Jul 18, 2024  4:25 PM Martinique E wrote: Patient/patient representative is calling to cancel or reschedule an appointment. Patient would like to cancel her appointment that is on 07/20/2024, as this is a pharmacy clinic appointment. Please call [phone] to help patient cancel/reschedule.

## 2024-07-19 ENCOUNTER — Telehealth: Payer: Self-pay | Admitting: *Deleted

## 2024-07-19 DIAGNOSIS — R1310 Dysphagia, unspecified: Secondary | ICD-10-CM | POA: Diagnosis not present

## 2024-07-19 DIAGNOSIS — Z87442 Personal history of urinary calculi: Secondary | ICD-10-CM | POA: Diagnosis not present

## 2024-07-19 DIAGNOSIS — K219 Gastro-esophageal reflux disease without esophagitis: Secondary | ICD-10-CM | POA: Diagnosis not present

## 2024-07-19 DIAGNOSIS — H269 Unspecified cataract: Secondary | ICD-10-CM | POA: Diagnosis not present

## 2024-07-19 DIAGNOSIS — E1169 Type 2 diabetes mellitus with other specified complication: Secondary | ICD-10-CM | POA: Diagnosis not present

## 2024-07-19 DIAGNOSIS — D649 Anemia, unspecified: Secondary | ICD-10-CM | POA: Diagnosis not present

## 2024-07-19 DIAGNOSIS — M06059 Rheumatoid arthritis without rheumatoid factor, unspecified hip: Secondary | ICD-10-CM | POA: Diagnosis not present

## 2024-07-19 DIAGNOSIS — E785 Hyperlipidemia, unspecified: Secondary | ICD-10-CM | POA: Diagnosis not present

## 2024-07-19 DIAGNOSIS — Z860100 Personal history of colon polyps, unspecified: Secondary | ICD-10-CM | POA: Diagnosis not present

## 2024-07-19 DIAGNOSIS — Z9049 Acquired absence of other specified parts of digestive tract: Secondary | ICD-10-CM | POA: Diagnosis not present

## 2024-07-19 DIAGNOSIS — I10 Essential (primary) hypertension: Secondary | ICD-10-CM | POA: Diagnosis not present

## 2024-07-19 DIAGNOSIS — M1990 Unspecified osteoarthritis, unspecified site: Secondary | ICD-10-CM | POA: Diagnosis not present

## 2024-07-19 DIAGNOSIS — J309 Allergic rhinitis, unspecified: Secondary | ICD-10-CM | POA: Diagnosis not present

## 2024-07-19 DIAGNOSIS — K760 Fatty (change of) liver, not elsewhere classified: Secondary | ICD-10-CM | POA: Diagnosis not present

## 2024-07-19 DIAGNOSIS — Z87891 Personal history of nicotine dependence: Secondary | ICD-10-CM | POA: Diagnosis not present

## 2024-07-19 DIAGNOSIS — Z556 Problems related to health literacy: Secondary | ICD-10-CM | POA: Diagnosis not present

## 2024-07-19 DIAGNOSIS — K649 Unspecified hemorrhoids: Secondary | ICD-10-CM | POA: Diagnosis not present

## 2024-07-19 DIAGNOSIS — K5730 Diverticulosis of large intestine without perforation or abscess without bleeding: Secondary | ICD-10-CM | POA: Diagnosis not present

## 2024-07-19 DIAGNOSIS — M06061 Rheumatoid arthritis without rheumatoid factor, right knee: Secondary | ICD-10-CM | POA: Diagnosis not present

## 2024-07-19 DIAGNOSIS — I2510 Atherosclerotic heart disease of native coronary artery without angina pectoris: Secondary | ICD-10-CM | POA: Diagnosis not present

## 2024-07-19 DIAGNOSIS — I700 Atherosclerosis of aorta: Secondary | ICD-10-CM | POA: Diagnosis not present

## 2024-07-19 DIAGNOSIS — Z87440 Personal history of urinary (tract) infections: Secondary | ICD-10-CM | POA: Diagnosis not present

## 2024-07-19 NOTE — Telephone Encounter (Signed)
Will route to [place].

## 2024-07-19 NOTE — Telephone Encounter (Signed)
 Copied from CRM #[id number]. Topic: Appointments - Scheduling Inquiry for Clinic >> Jul 19, 2024  8:26 AM Timindy P wrote: Reason for CRM: Patient has an appointment with the pharmacist on 07/20/2024 @ 2:00pm. Patient is asking if there is anyway this can be done over the phone as she will not be able to make it in. Pt can be reached at [phone]   ----------------------------------------------------------------------- From previous Reason for Contact - Appt Info/Confirm: Patient/patient representative is calling for information regarding an appointment.

## 2024-07-20 ENCOUNTER — Ambulatory Visit: Payer: Self-pay

## 2024-07-20 ENCOUNTER — Ambulatory Visit

## 2024-07-20 NOTE — Telephone Encounter (Signed)
 Cannot recommend anything further with out exam If any headache or fever- go to ER  Ice/heat/stretching

## 2024-07-20 NOTE — Telephone Encounter (Signed)
 Called and advised patient of Dr. Elsworth message. Pt voiced understanding, she will go to ED if she develops headache or fever. She declines having either at this time. Per patient request scheduled appt for 07/29/24.

## 2024-07-20 NOTE — Telephone Encounter (Signed)
 FYI Only or Action Required?: Action required by provider: clinical question for provider.  Patient was last seen in primary care on 06/03/2024 by Amy Laine LABOR, MD.  Called Nurse Triage reporting Neck Pain.  Symptoms began yesterday.  Interventions attempted: Ice/heat application.  Symptoms are: unchanged.  Triage Disposition: See PCP When Office is Open (Within 3 Days)  Patient/caregiver understands and will follow disposition?: No, wishes to speak with PCP  Copied from CRM #[id number]. Topic: Clinical - Red Word Triage >> Jul 20, 2024  9:28 AM [name] wrote: Red Word that prompted transfer to Nurse Triage: Pt pain having pain in the back of the neck. Pt never had this before when pt moves head or lay back the pain is more severe Reason for Disposition  [1] MODERATE neck pain (e.g., interferes with normal activities) AND [2] present > 3 days  Answer Assessment - Initial Assessment Questions No available appts. Pt reports does not want to come in, would like a CALL BACK. Advised UC/ED if symptoms worsen  1. ONSET: When did the pain begin?      yesterday 2. LOCATION: Where does it hurt?      Back of neck 3. PATTERN Does the pain come and go, or has it been constant since it started?      Laying head back hurts, hurts with movement 4. SEVERITY: How bad is the pain?  (Scale 0-10; or none or slight stiffness, mild, moderate, severe)     8/10; warm compress; has not tried any pain meds 5. RADIATION: Does the pain go anywhere else, shoot into your arms?     no 6. CORD SYMPTOMS: Any weakness or numbness of the arms or legs?     no 7. CAUSE: What do you think is causing the neck pain?     Not sure, never had before, may be from sleeping on pillow 8. NECK OVERUSE: Any recent activities that involved turning or twisting the neck? no      9. OTHER SYMPTOMS: Do you have any other symptoms? (e.g., headache, fever, chest pain, difficulty breathing, neck swelling)      denies  Protocols used: Neck Pain or Stiffness-A-AH

## 2024-07-22 ENCOUNTER — Other Ambulatory Visit: Payer: Self-pay

## 2024-07-22 NOTE — Patient Outreach (Signed)
 Complex Care Management   Visit Note  07/22/2024  Name:  [name] MRN: [id number] DOB: [birth year]  Situation: Referral received for Complex Care Management related to RA Pain/Falls I obtained verbal consent from Patient.  Visit completed with Patient  on the phone  Background:   Past Medical History:  Diagnosis Date   Allergic rhinitis    Cataract 2015   bilateral   COVID-19 10/2019   ASYMPTOMATIC   Diabetes mellitus without complication (HCC)    Diverticulosis    Esophageal reflux    Essential hypertension 01/07/2016   Fatty liver 2018   Fibula fracture 02/2005   Former smoker    Gastritis 2003   Heart murmur    MILD, NO CARDIOLOGIST   Hemorrhoids    History of cardiac dysrhythmia 2011   ABLATION  AT [facility]   History of kidney stones YRS AGO   Hyperglycemia    Hyperlipemia    Osteoarthritis    Personal history of colonic polyps 1998   hyperplastic   PMB (postmenopausal bleeding)    Pre-diabetes    BORDERLINE DIET CONTROLLED DOES NOT CHECK CBG   RA (rheumatoid arthritis) (HCC)    Rectal bleed 2018   TRANSFUSION GIVEN   Shingles 2012   Uterine fibroid     Assessment: Patient Reported Symptoms:  Cognitive Cognitive Status: No symptoms reported Cognitive/Intellectual Conditions Management [RPT]: None reported or documented in medical history or problem list   Health Maintenance Behaviors: Annual physical exam  Neurological Neurological Review of Symptoms: Numbness Oher Neurological Symptoms/Conditions [RPT]: neuropathy in feet Neurological Management Strategies: Medication therapy, Routine screening Neurological Self-Management Outcome: 4 (good)  HEENT        Cardiovascular Cardiovascular Symptoms Reported: No symptoms reported Cardiovascular Management Strategies: Routine screening, Medication therapy Weight: 153 lb (69.4 kg) Cardiovascular Comment: again reminded to purchase BP cuff with U card  Respiratory Respiratory Symptoms Reported: No  symptoms reported Respiratory Management Strategies: Routine screening, Medication therapy  Endocrine Endocrine Symptoms Reported: No symptoms reported Is patient diabetic?: Yes List most recent blood sugar readings, include date and time of day: has appointment for assist reviw how to check BG 07/29/24 Endocrine Self-Management Outcome: 3 (uncertain)  Gastrointestinal Gastrointestinal Symptoms Reported: No symptoms reported Additional Gastrointestinal Details: taking Miralax  regularly again since had consitipation      Genitourinary Genitourinary Symptoms Reported: No symptoms reported Genitourinary Self-Management Outcome: 4 (good)  Integumentary Integumentary Symptoms Reported: No symptoms reported    Musculoskeletal Musculoskelatal Symptoms Reviewed: Joint pain, Limited mobility, Muscle pain, Back pain, Difficulty walking, Unsteady gait, Weakness Additional Musculoskeletal Details: RZ and nueropathy - pain 8/10 joints rolling walker with seat/cane, PT just restarted - 1x week Musculoskeletal Management Strategies: Routine screening, Medical device Falls in the past year?: Yes Number of falls in past year: 2 or more Was there an injury with Fall?: Yes Fall Risk Category Calculator: 3 Patient Fall Risk Level: High Fall Risk Patient at Risk for Falls Due to: History of fall(s), Impaired balance/gait, Impaired mobility Fall risk Follow up: Falls evaluation completed, Falls prevention discussed  Psychosocial Psychosocial Symptoms Reported: No symptoms reported Additional Psychological Details: discussed Palliative Care Services - mailing info   Major Change/Loss/Stressor/Fears (CP): Medical condition, self Behaviors When Feeling Stressed/Fearful: prayer      07/22/2024    PHQ2-9 Depression Screening   Little interest or pleasure in doing things Not at all  Feeling down, depressed, or hopeless Several days (related to pain)  PHQ-2 - Total Score 1  Trouble falling or staying asleep,  or sleeping too much    Feeling tired or having little energy    Poor appetite or overeating     Feeling bad about yourself - or that you are a failure or have let yourself or your family down    Trouble concentrating on things, such as reading the newspaper or watching television    Moving or speaking so slowly that other people could have noticed.  Or the opposite - being so fidgety or restless that you have been moving around a lot more than usual    Thoughts that you would be better off dead, or hurting yourself in some way    PHQ2-9 Total Score    If you checked off any problems, how difficult have these problems made it for you to do your work, take care of things at home, or get along with other people    Depression Interventions/Treatment      There were no vitals filed for this visit.  Medications Reviewed Today     Reviewed by Devra Lands, RN (Registered Nurse) on 07/22/24 at 1111  Med List Status: <None>   Medication Order Taking? Sig Documenting Provider Last Dose Status Informant  ACCU-CHEK GUIDE TEST test strip [id number] Yes USE TO CHECK BLOOD SUGAR DAILY Tower, Laine LABOR, MD  Active   Accu-Chek Softclix Lancets lancets [id number] Yes Check glucose level daily and as needed for diabetes type 2  (lancets for the soft clicks lancing device AccuChek meter) Tower, Laine LABOR, MD  Active   acetaminophen  (TYLENOL ) 325 MG tablet [id number] Yes Take 650 mg by mouth every 6 (six) hours as needed. [provider]  Active Self  albuterol  (PROVENTIL  HFA;VENTOLIN  HFA) 108 (90 Base) MCG/ACT inhaler [id number] Yes Inhale 2 puffs into the lungs every 4 (four) hours as needed for wheezing or shortness of breath. Please give spacer if possible Tower, Laine LABOR, MD  Active Self  amLODipine  (NORVASC ) 5 MG tablet [id number] Yes TAKE 1 TABLET (5 MG TOTAL) BY MOUTH DAILY.  Patient taking differently: Take 5 mg by mouth daily. States not taking every day but will try again   Tower, Laine LABOR, MD   Active   atorvastatin  (LIPITOR) 10 MG tablet [id number] Yes TAKE 0.5 TABLETS (5 MG TOTAL) BY MOUTH EVERY OTHER DAY. Tower, Laine LABOR, MD  Active   blood glucose meter kit and supplies [id number] Yes To check glucose daily and prn for DM2 E11.9 Tower, Laine LABOR, MD  Active   Blood Pressure Monitoring (ADULT BLOOD PRESSURE CUFF LG) KIT [id number]  Blood pressure cuff size large for the arm to check blood pressure daily and as needed for HTN  Patient not taking: Reported on 07/22/2024   Randeen Laine LABOR, MD  Active   chlorthalidone  (HYGROTON ) 25 MG tablet [id number] Yes Take 1 tablet (25 mg total) by mouth daily. Tower, Laine LABOR, MD  Active   cyanocobalamin  (VITAMIN B12) 500 MCG tablet [id number] Yes Take 500 mcg by mouth daily. [provider]  Active   EPINEPHrine [facility] IJ) [id number] Yes Inject as directed as needed. Reported on 12/05/2015 [provider]  Active Self  fluticasone  (FLONASE ) 50 MCG/ACT nasal spray [id number] Yes PLACE 2 SPRAYS INTO THE NOSE DAILY. Tower, Laine LABOR, MD  Active            Med Note DORI, DAVINA E   Tue Mar 29, 2024 11:29 AM) Patient states she takes as needed.   folic acid  (FOLVITE ) 1 MG tablet [id number] Yes Take 1 mg  by mouth daily. [provider]  Active Self  glipiZIDE  (GLUCOTROL  XL) 2.5 MG 24 hr tablet [id number] Yes Take 1 tablet (2.5 mg total) by mouth daily with breakfast. Tower, Laine LABOR, MD  Active   hydroxychloroquine (PLAQUENIL) 200 MG tablet [id number] Yes Take 200 mg by mouth 2 (two) times daily. [provider]  Active   loratadine  (CLARITIN ) 10 MG tablet [id number] Yes Take 10 mg by mouth daily. MAY TAKE AT [facility] ALSO [provider]  Active Self  methotrexate (RHEUMATREX) 2.5 MG tablet [id number] Yes Take 15 mg by mouth once a week. Caution:Chemotherapy. Protect from light. ( Tuesday of each week ) [provider]  Active Self  ondansetron  (ZOFRAN ) 4 MG tablet [id number] Yes Take 1 tablet (4 mg total) by mouth every 8  (eight) hours as needed for nausea or vomiting. Caution of sedation Tower, Laine LABOR, MD  Active   pantoprazole  (PROTONIX ) 40 MG tablet [id number] Yes TAKE 1 TABLET BY MOUTH EVERY DAY Tower, Laine LABOR, MD  Active   polyethylene glycol (MIRALAX  / GLYCOLAX ) packet [id number] Yes Take 17 g by mouth daily. [provider]  Active Self  potassium chloride  (KLOR-CON  M) 10 MEQ tablet [id number] Yes TAKE 1 TABLET BY MOUTH TWICE A DAY Tower, Marne A, MD  Active   triamcinolone  cream (KENALOG ) 0.1 % [id number] Yes Apply 1 Application topically 2 (two) times daily as needed. On rash/affected area Tower, Laine LABOR, MD  Active             Recommendation:   PCP Follow-up Continue Current Plan of Care  Follow Up Plan:   Telephone follow-up in 1 month  Nestora Duos, MSN, RN [facility]  [facility], [facility] RN Care Manager Direct Dial: [phone] Fax: [phone]

## 2024-07-22 NOTE — Patient Instructions (Signed)
 Visit Information  Thank you for taking time to visit with me today. Please don't hesitate to contact me if I can be of assistance to you before our next scheduled appointment.  Your next care management appointment is by telephone on 08/19/2024 at 10:00 am  Telephone follow-up in 1 month  Please call the care guide team at [phone] if you need to cancel, schedule, or reschedule an appointment.   Please call the Suicide and Crisis Lifeline: 988 call the [place]  National Suicide Prevention Lifeline: 210 592 3038 or TTY: 513-159-9570 TTY (801)223-1772) to talk to a trained counselor call 1-800-273-TALK (toll free, 24 hour hotline) go to [facility] [address] [place] [phone]) call 911 if you are experiencing a Mental Health or Behavioral Health Crisis or need someone to talk to.  Nestora Duos, MSN, RN [place]  [facility], [facility] RN Care Manager Direct Dial: [phone] Fax: [phone]   Palliative Care Palliative care can help make your quality of life better if you have a very serious illness. It involves care of your body, mind, and spirit. It's based on what you need and want in this stage of life. In many cases, care may take place in a hospital or long-term care setting. It may include: Help with pain and other symptoms. Family support. Spiritual support. Emotional support. Social support. Palliative care can help bring you comfort and peace of mind. It can be helpful to you and your family during the course of an illness. What is the difference between palliative care and hospice? Palliative care and hospice have similar goals. They both aim to: Help with symptoms. Provide comfort. Make your quality of life better. Maintain your dignity. They're different in that palliative care can be given: At the same time as other treatments. During any phase of a serious illness, from diagnosis  to cure. Hospice care is given when: You're thought to have 6 months or less to live. You no longer can, or want to, try to find a cure for your illness. Who can get palliative care services? Palliative care is given to children and adults who are very ill. It may be offered if: You're not responding well to treatment. You need help with pain. You have side effects from treatment that are hard to manage. You have symptoms from the effects of surgery. You have been diagnosed with a disease that: Is advanced. Will shorten your life. Your health care provider may talk with you about palliative care if they think the support would help you. Your family and friends may also get help to manage stress and other concerns. Who makes up the palliative care team? The care team includes: You and your family. Your providers and specialists. Nurses. A Child psychotherapist, psychologist, or psychiatrist. Based on your needs, the team may also include: A pain specialist. A hospice specialist. Someone to help with finances or insurance. Religious or spiritual leaders. A case Production designer, theatre/television/film. A grief counselor. How will the palliative care team help me and my family? The team will speak with you and your family about: Your symptoms, such as: Pain. Nausea. Vomiting. Shortness of breath. The need for specialists. Advance directives. These may include: Living wills. Health care proxies. Symptoms that have to do with your mental health, such as: Stress. Depression. This is when you feel sad or hopeless. Anxiety. This is when you feel worried or nervous. Treatment options and how to keep as much as possible of your: Function. Ability to  move around. Spiritual wishes, such as: Rituals. Prayer. Things you can do to leave a legacy or make memories. Life and death as a normal process. End-of-life care. The team can also help you talk about: Hard issues. Spiritual concerns. Emotional concerns. Where to  find more information [employer] on Aging (NIA): [employer]RingT[employer] National Hospice and Palliative Care Organization [facility]): [email or web address]-phillips.[email or web address] This information is not intended to replace advice given to you by your health care provider. Make sure you discuss any questions you have with your health care provider. Document Revised: 02/23/2023 Document Reviewed: 02/23/2023 Elsevier Patient Education  2024 [employer].

## 2024-07-26 ENCOUNTER — Ambulatory Visit

## 2024-07-27 ENCOUNTER — Ambulatory Visit

## 2024-07-29 ENCOUNTER — Emergency Department (HOSPITAL_COMMUNITY)

## 2024-07-29 ENCOUNTER — Encounter (HOSPITAL_COMMUNITY): Payer: Self-pay | Admitting: *Deleted

## 2024-07-29 ENCOUNTER — Other Ambulatory Visit: Payer: Self-pay

## 2024-07-29 ENCOUNTER — Inpatient Hospital Stay (HOSPITAL_COMMUNITY)
Admission: EM | Admit: 2024-07-29 | Discharge: 2024-08-02 | DRG: 244 | Disposition: A | Discharge status: Home Health | Attending: Internal Medicine | Admitting: Internal Medicine

## 2024-07-29 ENCOUNTER — Ambulatory Visit: Payer: Self-pay

## 2024-07-29 ENCOUNTER — Ambulatory Visit: Admitting: Family Medicine

## 2024-07-29 DIAGNOSIS — Z888 Allergy status to other drugs, medicaments and biological substances status: Secondary | ICD-10-CM

## 2024-07-29 DIAGNOSIS — I441 Atrioventricular block, second degree: Principal | ICD-10-CM | POA: Diagnosis present

## 2024-07-29 DIAGNOSIS — E785 Hyperlipidemia, unspecified: Secondary | ICD-10-CM | POA: Diagnosis present

## 2024-07-29 DIAGNOSIS — I4892 Unspecified atrial flutter: Secondary | ICD-10-CM | POA: Diagnosis not present

## 2024-07-29 DIAGNOSIS — Z833 Family history of diabetes mellitus: Secondary | ICD-10-CM

## 2024-07-29 DIAGNOSIS — I10 Essential (primary) hypertension: Secondary | ICD-10-CM | POA: Diagnosis present

## 2024-07-29 DIAGNOSIS — I2510 Atherosclerotic heart disease of native coronary artery without angina pectoris: Secondary | ICD-10-CM | POA: Diagnosis not present

## 2024-07-29 DIAGNOSIS — E876 Hypokalemia: Secondary | ICD-10-CM | POA: Diagnosis present

## 2024-07-29 DIAGNOSIS — R1310 Dysphagia, unspecified: Secondary | ICD-10-CM | POA: Diagnosis present

## 2024-07-29 DIAGNOSIS — M069 Rheumatoid arthritis, unspecified: Secondary | ICD-10-CM | POA: Diagnosis not present

## 2024-07-29 DIAGNOSIS — Z8249 Family history of ischemic heart disease and other diseases of the circulatory system: Secondary | ICD-10-CM

## 2024-07-29 DIAGNOSIS — K219 Gastro-esophageal reflux disease without esophagitis: Secondary | ICD-10-CM | POA: Diagnosis not present

## 2024-07-29 DIAGNOSIS — Z7984 Long term (current) use of oral hypoglycemic drugs: Secondary | ICD-10-CM

## 2024-07-29 DIAGNOSIS — Z87891 Personal history of nicotine dependence: Secondary | ICD-10-CM

## 2024-07-29 DIAGNOSIS — I160 Hypertensive urgency: Secondary | ICD-10-CM | POA: Diagnosis present

## 2024-07-29 DIAGNOSIS — E119 Type 2 diabetes mellitus without complications: Secondary | ICD-10-CM | POA: Diagnosis not present

## 2024-07-29 DIAGNOSIS — I471 Supraventricular tachycardia, unspecified: Secondary | ICD-10-CM | POA: Diagnosis present

## 2024-07-29 DIAGNOSIS — I159 Secondary hypertension, unspecified: Secondary | ICD-10-CM | POA: Diagnosis not present

## 2024-07-29 DIAGNOSIS — I6523 Occlusion and stenosis of bilateral carotid arteries: Secondary | ICD-10-CM | POA: Diagnosis not present

## 2024-07-29 DIAGNOSIS — R001 Bradycardia, unspecified: Secondary | ICD-10-CM | POA: Diagnosis not present

## 2024-07-29 DIAGNOSIS — Z91030 Bee allergy status: Secondary | ICD-10-CM | POA: Diagnosis not present

## 2024-07-29 DIAGNOSIS — R519 Headache, unspecified: Secondary | ICD-10-CM

## 2024-07-29 DIAGNOSIS — Z91199 Patient's noncompliance with other medical treatment and regimen due to unspecified reason: Secondary | ICD-10-CM

## 2024-07-29 DIAGNOSIS — Z8679 Personal history of other diseases of the circulatory system: Secondary | ICD-10-CM | POA: Diagnosis not present

## 2024-07-29 LAB — CBC
HCT: 37.4 % (ref 36.0–46.0)
Hemoglobin: 11.9 g/dL — ABNORMAL LOW (ref 12.0–15.0)
MCH: 28.3 pg (ref 26.0–34.0)
MCHC: 31.8 g/dL (ref 30.0–36.0)
MCV: 88.8 fL (ref 80.0–100.0)
Platelets: 268 K/uL (ref 150–400)
RBC: 4.21 MIL/uL (ref 3.87–5.11)
RDW: 17 % — ABNORMAL HIGH (ref 11.5–15.5)
WBC: 4.2 K/uL (ref 4.0–10.5)
nRBC: 0 % (ref 0.0–0.2)

## 2024-07-29 LAB — TROPONIN I (HIGH SENSITIVITY)
Troponin I (High Sensitivity): 16 ng/L (ref <18)
Troponin I (High Sensitivity): 22 ng/L — ABNORMAL HIGH (ref <18)

## 2024-07-29 LAB — BASIC METABOLIC PANEL WITH GFR
Anion gap: 13 (ref 5–15)
BUN: <5 mg/dL — ABNORMAL LOW (ref 8–23)
CO2: 22 mmol/L (ref 22–32)
Calcium: 8.7 mg/dL — ABNORMAL LOW (ref 8.9–10.3)
Chloride: 106 mmol/L (ref 98–111)
Creatinine, Ser: 0.66 mg/dL (ref 0.44–1.00)
GFR, Estimated: >60 mL/min (ref >60)
Glucose, Bld: 86 mg/dL (ref 70–99)
Potassium: 3.4 mmol/L — ABNORMAL LOW (ref 3.5–5.1)
Sodium: 141 mmol/L (ref 135–145)

## 2024-07-29 LAB — MAGNESIUM: Magnesium: 1.7 mg/dL (ref 1.7–2.4)

## 2024-07-29 LAB — TSH: TSH: 1.572 u[IU]/mL (ref 0.350–4.500)

## 2024-07-29 MED ORDER — ACETAMINOPHEN 325 MG PO TABS
325.0000 mg | ORAL_TABLET | Freq: Four times a day (QID) | ORAL | Status: DC | PRN
  Administered 2024-07-30: 325 mg via ORAL
  Filled 2024-07-29: qty 1

## 2024-07-29 MED ORDER — HEPARIN BOLUS VIA INFUSION
3500.0000 [IU] | Freq: Once | INTRAVENOUS | Status: AC
  Administered 2024-07-29: 3500 [IU] via INTRAVENOUS
  Filled 2024-07-29: qty 3500

## 2024-07-29 MED ORDER — HEPARIN (PORCINE) 25000 UT/250ML-% IV SOLN
1300.0000 [IU]/h | INTRAVENOUS | Status: DC
  Administered 2024-07-29: 1000 [IU]/h via INTRAVENOUS
  Filled 2024-07-29 (×2): qty 250

## 2024-07-29 MED ORDER — ACETAMINOPHEN 325 MG PO TABS
325.0000 mg | ORAL_TABLET | Freq: Once | ORAL | Status: AC
  Administered 2024-07-29: 325 mg via ORAL

## 2024-07-29 MED ORDER — ACETAMINOPHEN 325 MG PO TABS
ORAL_TABLET | ORAL | Status: AC
  Filled 2024-07-29: qty 1

## 2024-07-29 MED ORDER — HYDRALAZINE HCL 20 MG/ML IJ SOLN
10.0000 mg | Freq: Once | INTRAMUSCULAR | Status: AC
  Administered 2024-07-29: 10 mg via INTRAVENOUS
  Filled 2024-07-29: qty 1

## 2024-07-29 NOTE — ED Provider Notes (Signed)
 [place] EMERGENCY DEPARTMENT AT [facility] Provider Note   CSN: [id number] Arrival date & time: 07/29/24  1608     Patient presents with: Bradycardia   [name] is a 83 y.o. female.  With a history of dysrhythmia status post ablation (2011) type 2 diabetes, hypertension, hyperlipidemia who presents to the ED for bradycardia.  Patient seen earlier today by her PCP given concern for persistently high blood pressures at home and a low heart rate also reporting a headache.  Noted to be very hypertensive as well as bradycardic in the urgent care office and sent here for further evaluation.  Patient reported to her urgent care that she did not take her dose of amlodipine  this morning but she tells me she did in fact take it today.  Upon my assessment she is not having chest pain still has a mild frontal headache.  No nausea vomiting diaphoresis fevers chills recent illness.   HPI     Prior to Admission medications   Medication Sig Start Date End Date Taking? Authorizing Provider  ACCU-CHEK GUIDE TEST test strip USE TO CHECK BLOOD SUGAR DAILY 02/01/24   Tower, Laine LABOR, MD  Accu-Chek Softclix Lancets lancets Check glucose level daily and as needed for diabetes type 2  (lancets for the soft clicks lancing device AccuChek meter) 09/04/23   Tower, Laine LABOR, MD  acetaminophen  (TYLENOL ) 325 MG tablet Take 650 mg by mouth every 6 (six) hours as needed.    [provider]  albuterol  (PROVENTIL  HFA;VENTOLIN  HFA) 108 (90 Base) MCG/ACT inhaler Inhale 2 puffs into the lungs every 4 (four) hours as needed for wheezing or shortness of breath. Please give spacer if possible 10/29/18   Tower, Laine LABOR, MD  amLODipine  (NORVASC ) 5 MG tablet TAKE 1 TABLET (5 MG TOTAL) BY MOUTH DAILY. Patient taking differently: Take 5 mg by mouth daily. States not taking every day but will try again 06/16/24   Tower, Laine LABOR, MD  atorvastatin  (LIPITOR) 10 MG tablet TAKE 0.5 TABLETS (5 MG TOTAL) BY MOUTH  EVERY OTHER DAY. 06/03/24   Tower, Laine LABOR, MD  blood glucose meter kit and supplies To check glucose daily and prn for DM2 E11.9 04/01/22   Tower, Laine LABOR, MD  Blood Pressure Monitoring (ADULT BLOOD PRESSURE CUFF LG) KIT Blood pressure cuff size large for the arm to check blood pressure daily and as needed for HTN Patient not taking: Reported on 07/22/2024 06/03/24   Tower, Laine LABOR, MD  chlorthalidone  (HYGROTON ) 25 MG tablet Take 1 tablet (25 mg total) by mouth daily. 09/04/23   Tower, Laine LABOR, MD  cyanocobalamin  (VITAMIN B12) 500 MCG tablet Take 500 mcg by mouth daily.    [provider]  EPINEPHrine (EPIPEN IJ) Inject as directed as needed. Reported on 12/05/2015    [provider]  fluticasone  (FLONASE ) 50 MCG/ACT nasal spray PLACE 2 SPRAYS INTO THE NOSE DAILY. 01/06/23   Tower, Laine LABOR, MD  folic acid  (FOLVITE ) 1 MG tablet Take 1 mg by mouth daily.    [provider]  glipiZIDE  (GLUCOTROL  XL) 2.5 MG 24 hr tablet Take 1 tablet (2.5 mg total) by mouth daily with breakfast. 09/04/23   Tower, Laine LABOR, MD  hydroxychloroquine (PLAQUENIL) 200 MG tablet Take 200 mg by mouth 2 (two) times daily. 12/30/22   [provider]  loratadine  (CLARITIN ) 10 MG tablet Take 10 mg by mouth daily. MAY TAKE AT [facility] ALSO    [provider]  methotrexate (  RHEUMATREX) 2.5 MG tablet Take 15 mg by mouth once a week. Caution:Chemotherapy. Protect from light. ( Tuesday of each week )    [provider]  ondansetron  (ZOFRAN ) 4 MG tablet Take 1 tablet (4 mg total) by mouth every 8 (eight) hours as needed for nausea or vomiting. Caution of sedation 05/27/23   Tower, Laine LABOR, MD  pantoprazole  (PROTONIX ) 40 MG tablet TAKE 1 TABLET BY MOUTH EVERY DAY 05/25/24   Tower, Laine LABOR, MD  polyethylene glycol (MIRALAX  / GLYCOLAX ) packet Take 17 g by mouth daily.    [provider]  potassium chloride  (KLOR-CON  M) 10 MEQ tablet TAKE 1 TABLET BY MOUTH TWICE A DAY 06/14/24   Tower, Laine LABOR, MD   triamcinolone  cream (KENALOG ) 0.1 % Apply 1 Application topically 2 (two) times daily as needed. On rash/affected area 01/15/24   Tower, Laine LABOR, MD    Allergies: Bee venom, Dicyclomine , Humira [adalimumab], and Metformin  and related    Review of Systems  Updated Vital Signs BP (!) 154/60   Pulse (!) 51   Temp 97.6 F (36.4 C)   Resp 14   Ht 5' 7 (1.702 m)   Wt 69.4 kg   SpO2 98%   BMI 23.96 kg/m   Physical Exam Vitals and nursing note reviewed.  HENT:     Head: Normocephalic and atraumatic.  Eyes:     Pupils: Pupils are equal, round, and reactive to light.  Cardiovascular:     Rate and Rhythm: Regular rhythm. Bradycardia present.  Pulmonary:     Effort: Pulmonary effort is normal.     Breath sounds: Normal breath sounds.  Abdominal:     Palpations: Abdomen is soft.     Tenderness: There is no abdominal tenderness.  Skin:    General: Skin is warm and dry.  Neurological:     General: No focal deficit present.     Mental Status: She is alert.     Sensory: No sensory deficit.     Motor: No weakness.  Psychiatric:        Mood and Affect: Mood normal.     (all labs ordered are listed, but only abnormal results are displayed) Labs Reviewed  BASIC METABOLIC PANEL WITH GFR - Abnormal; Notable for the following components:      Result Value   Potassium 3.4 (*)    BUN <5 (*)    Calcium  8.7 (*)    All other components within normal limits  CBC - Abnormal; Notable for the following components:   Hemoglobin 11.9 (*)    RDW 17.0 (*)    All other components within normal limits  TROPONIN I (HIGH SENSITIVITY) - Abnormal; Notable for the following components:   Troponin I (High Sensitivity) 22 (*)    All other components within normal limits  TSH  MAGNESIUM   HEPARIN LEVEL (UNFRACTIONATED)  CBC  TROPONIN I (HIGH SENSITIVITY)    EKG: EKG Interpretation Date/Time:  Friday July 29 2024 17:50:56 EDT Ventricular Rate:  62 PR Interval:  238 QRS Duration:  93 QT  Interval:  484 QTC Calculation: 492 R Axis:   40  Text Interpretation: Second degree heart block Low voltage, precordial leads Anteroseptal infarct, old Confirmed by Pamella Sharper [phone]) on 07/29/2024 6:45:41 PM  Radiology: CT Head Wo Contrast Result Date: 07/29/2024 EXAM: CT HEAD WITHOUT CONTRAST 07/29/2024 05:47:57 PM TECHNIQUE: CT of the head was performed without the administration of intravenous contrast. Automated exposure control, iterative reconstruction, and/or weight based adjustment of the  mA/kV was utilized to reduce the radiation dose to as low as reasonably achievable. COMPARISON: None available. CLINICAL HISTORY: Headache, new onset (Age >= 51y); HA HTN. FINDINGS: BRAIN AND VENTRICLES: Atherosclerotic calcifications are present in the cavernous carotid arteries bilaterally. No hyperdense vessel is present. No acute hemorrhage. No evidence of acute infarct. No hydrocephalus. No extra-axial collection. No mass effect or midline shift. ORBITS: Bilateral lens replacements are noted. The globes and orbits are otherwise within normal limits. SINUSES: Wall thickening is present in the left maxillary sinus suggesting history of chronic disease. No active sinus disease is present. SOFT TISSUES AND SKULL: No acute soft tissue abnormality. No skull fracture. IMPRESSION: 1. No acute intracranial abnormality. 2. Atherosclerotic calcifications in the cavernous carotid arteries bilaterally. No hyperdense vessel. Electronically signed by: Lonni Necessary MD 07/29/2024 05:59 PM EDT RP Workstation: [id number]   DG Chest Portable 1 View Result Date: 07/29/2024 CLINICAL DATA:  Bradycardia and hypertension EXAM: PORTABLE CHEST 1 VIEW COMPARISON:  Chest radiograph dated 08/26/2023 FINDINGS: Normal lung volumes. No focal consolidations. No pleural effusion or pneumothorax. Enlarged cardiomediastinal silhouette is likely projectional. No acute osseous abnormality. IMPRESSION: No active disease.  Electronically Signed   By: Limin  Xu M.D.   On: 07/29/2024 17:18     .Critical Care  Performed by: Pamella Ozell LABOR, DO Authorized by: Pamella Ozell LABOR, DO   Critical care provider statement:    Critical care time (minutes):  30   Critical care was necessary to treat or prevent imminent or life-threatening deterioration of the following conditions:  Cardiac failure   Critical care was time spent personally by me on the following activities:  Development of treatment plan with patient or surrogate, discussions with consultants, evaluation of patient's response to treatment, examination of patient, ordering and review of laboratory studies, ordering and review of radiographic studies, ordering and performing treatments and interventions, pulse oximetry, re-evaluation of patient's condition and review of old charts   I assumed direction of critical care for this patient from another provider in my specialty: no     Care discussed with: admitting provider   Comments:     Discussed with hospitalist and cardiology    Medications Ordered in the ED  hydrALAZINE (APRESOLINE) injection 10 mg (has no administration in time range)  heparin ADULT infusion 100 units/mL (25000 units/250mL) (has no administration in time range)  heparin bolus via infusion 3,500 Units (has no administration in time range)  acetaminophen  (TYLENOL ) tablet 325 mg (325 mg Oral Given 07/29/24 1643)    Clinical Course as of 07/29/24 2038  Fri Jul 29, 2024  1918 CT head without acute intracranial findings.  Laboratory workup unremarkable.  No significant elevation in troponin but we will obtain a delta troponin as well.  TSH within normal limits.  Electrolytes look okay.  Patient remains in sinus bradycardia.  I discussed this with Dr. Jeffrie (cardiology) who feels patient may be going in and out of sinus bradycardia and type I secondary heart block.  Considering she was symptomatic from this earlier he recommends admission to  medicine team for observation overnight.  Cardiology will come by and consult.  Also recommends 10 mg hydralazine for management of acute hypertension.  Will page to hospitalist for admission [MP]  2037 Discussed with admitting hospitalist who close patient for admission [MP]    Clinical Course User Index [MP] Pamella Ozell LABOR, DO  Medical Decision Making 83 year old female with history as above presenting from urgent care for hypertension and bradycardia.  Systolic blood pressure over 200 upon arrival here.  Upon my initial assessment systolic 180.  Remains bradycardic.  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  We will obtain laboratory workup and place her on monitor with defibrillation pads in place.  Will reach out to cardiology.  Given reported headache with severe hypertension will obtain CT head to evaluate for any evidence of hypertensive brain bleed.  No focal neurologic deficits on my assessment.  Amount and/or Complexity of Data Reviewed Radiology: ordered.  Risk Prescription drug management. Decision regarding hospitalization.        Final diagnoses:  Bradycardia  Secondary hypertension  Acute nonintractable headache, unspecified headache type    ED Discharge Orders     None          Pamella Ozell LABOR, DO 07/29/24 2038

## 2024-07-29 NOTE — ED Notes (Signed)
Admit provider at bedside 

## 2024-07-29 NOTE — Progress Notes (Signed)
Headache started today

## 2024-07-29 NOTE — Progress Notes (Signed)
 PHARMACY - ANTICOAGULATION CONSULT NOTE  Pharmacy Consult for heparin Indication: atrial fibrillation  Allergies  Allergen Reactions   Bee Venom Anaphylaxis   Dicyclomine      Blurry vision   Humira [Adalimumab]     LIPS SWELLED AND RASH   Metformin  And Related     rash   Patient Measurements: Height: 5' 7 (170.2 cm) Weight: 69.4 kg (153 lb) IBW/kg (Calculated) : 61.6 HEPARIN DW (KG): 69.4  Vital Signs: Temp: 97.6 F (36.4 C) (10/10 1613) BP: 172/93 (10/10 1845) Pulse Rate: 52 (10/10 1845)  Labs: Recent Labs    07/29/24 1639  HGB 11.9*  HCT 37.4  PLT 268  CREATININE 0.66  TROPONINIHS 16   Estimated Creatinine Clearance: 52.7 mL/min (by C-G formula based on SCr of 0.66 mg/dL).  Medical History: Past Medical History:  Diagnosis Date   Allergic rhinitis    Cataract 2015   bilateral   COVID-19 10/2019   ASYMPTOMATIC   Diabetes mellitus without complication (HCC)    Diverticulosis    Esophageal reflux    Essential hypertension 01/07/2016   Fatty liver 2018   Fibula fracture 02/2005   Former smoker    Gastritis 2003   Heart murmur    MILD, NO CARDIOLOGIST   Hemorrhoids    History of cardiac dysrhythmia 2011   ABLATION  AT [facility]   History of kidney stones YRS AGO   Hyperglycemia    Hyperlipemia    Osteoarthritis    Personal history of colonic polyps 1998   hyperplastic   PMB (postmenopausal bleeding)    Pre-diabetes    BORDERLINE DIET CONTROLLED DOES NOT CHECK CBG   RA (rheumatoid arthritis) (HCC)    Rectal bleed 2018   TRANSFUSION GIVEN   Shingles 2012   Uterine fibroid     Medications:  Scheduled:   hydrALAZINE  10 mg Intravenous Once   Assessment: Patient is an 83 YO F presenting to the ED for evaluation of bradycardia and hypertension. Pharmacy consulted for heparin dosing for atrial flutter. Patient without any known history of arrhythmias. Patient not on anticoagulation PTA. Hgb low-normal at 11.9 and plt WNL.   Goal of Therapy:   Heparin level 0.3-0.7 units/ml Monitor platelets by anticoagulation protocol: Yes   Plan:  -Give heparin bolus 3500 units.  -Start heparin infusion at 1000 units/h.  -Check heparin level with AM labs (~8h).  -Monitor heparin levels, CBC daily.   Maurilio Patten, PharmD PGY1 Pharmacy Resident [facility] [address] 07/29/2024 7:52 PM

## 2024-07-29 NOTE — Telephone Encounter (Signed)
 She takes chlorthalidone  and amlodipine  for HTN and this notes she had not taken her medicine yet  Has she taken it yet? Does she have the ability to check blood pressure and pulse at home herself /if so any changes?  How is she feeling ? (Any dizziness from the low HR)  Thanks   Please watch for any cancellations to put her on schedule with first available, thanks   Agree with ER/UC precautions

## 2024-07-29 NOTE — ED Triage Notes (Signed)
 Pt sent from MD for hypertension (180s systolic) and bradycardia in the 30s. Rhythm at MD office A-Fib. No history of arrhythmia. Pt endorses CP.

## 2024-07-29 NOTE — Telephone Encounter (Signed)
 Pt went to UC to get eval and was sent to ER. Notes in epic,  Fyi to pcp

## 2024-07-29 NOTE — H&P (Addendum)
 History and Physical    [name] FMW:[id number] DOB: October 16, 1941 DOA: 07/29/2024  PCP: Randeen Laine LABOR, MD   Patient coming from: Home  I have personally briefly reviewed patient's old medical records in [facility]  Chief Complaint: Elevated blood pressure  HPI: [name] is a 83 y.o. female with medical history significant for diabetes mellitus, hypertension, rheumatoid arthritis. Patient presented to the ED with reports of high blood pressure and low heart rate.  Patient was seen by home health nurse today, vitals showed elevated blood pressure systolic in the 180s, and heart rate - 38.  She was referred to the ED. Patient has been taking her blood pressure medications intermittently, she is supposed to be on Norvasc , and does not know if she is taking chlorthalidone  also.  She reports generalized headache earlier today that has improved.  She reports intermittent chest pain lasting a few minutes and occurring at rest without any known aggravating or relieving factors.  She is unable to characterize the pain further.  ED Course: Temperature 97.6.  Heart rate 47-76.  Respiratory rate 12-24.  Blood pressure systolic 204/73 improved to 140s over 50s.  O2 sats greater than 94% on room air. Potassium 3.4.  Troponin 16 >> 22. Normal TSH 1.572. Potassium 3.4. Magnesium  1.7. Chest x-ray and head CT negative for acute abnormality. IV hydralazine 10 mg x 1 given Cardiology consulted.  Review of Systems: As per HPI all other systems reviewed and negative.  Past Medical History:  Diagnosis Date   Allergic rhinitis    Cataract 2015   bilateral   COVID-19 10/2019   ASYMPTOMATIC   Diabetes mellitus without complication (HCC)    Diverticulosis    Esophageal reflux    Essential hypertension 01/07/2016   Fatty liver 2018   Fibula fracture 02/2005   Former smoker    Gastritis 2003   Heart murmur    MILD, NO CARDIOLOGIST   Hemorrhoids    History of cardiac dysrhythmia  2011   ABLATION  AT [facility]   History of kidney stones YRS AGO   Hyperglycemia    Hyperlipemia    Osteoarthritis    Personal history of colonic polyps 1998   hyperplastic   PMB (postmenopausal bleeding)    Pre-diabetes    BORDERLINE DIET CONTROLLED DOES NOT CHECK CBG   RA (rheumatoid arthritis) (HCC)    Rectal bleed 2018   TRANSFUSION GIVEN   Shingles 2012   Uterine fibroid     Past Surgical History:  Procedure Laterality Date   CHOLECYSTECTOMY  YRS AGO   COLONOSCOPY  11/08   internal hemorrhoids   DILATATION & CURETTAGE/HYSTEROSCOPY WITH MYOSURE N/A 07/06/2020   Procedure: DILATATION & CURETTAGE/HYSTEROSCOPY;  Surgeon: Arnaldo Purchase, MD;  Location: [facility];  Service: Gynecology;  Laterality: N/A;  request 7:30am OR time in [place] Gyn block requests 30 minutes   EP procedure  1994   SVT; normal echo   ESOPHAGOGASTRODUODENOSCOPY  11/03   reactive gastropathy   EYE SURGERY Bilateral 2017   CATARACTS   LIPOMA EXCISION  11/2002   SVT s/p surgery--? ablation  2011   AT [facility]   TUBAL LIGATION  YRS AGO   vaginal polyp excised  12/2000   benign     reports that she quit smoking about 20 years ago. Her smoking use included cigarettes. She started smoking about 35 years ago. She has a 11.3 pack-year smoking history. She has never used smokeless tobacco. She reports that she does  not drink alcohol and does not use drugs.  Allergies  Allergen Reactions   Bee Venom Anaphylaxis   Dicyclomine      Blurry vision   Humira [Adalimumab]     LIPS SWELLED AND RASH   Metformin  And Related     rash    Family History  Problem Relation Age of Onset   Colon cancer Father 2   Diabetes Mother    Heart disease Mother    Kidney disease Mother        ESRD   Stroke Mother    Hypertension Brother    Breast cancer Sister 11   Diabetes Other        nephews   Colon cancer Sister    Esophageal cancer Neg Hx    Stomach cancer Neg Hx    Rectal cancer Neg Hx      Prior to Admission medications   Medication Sig Start Date End Date Taking? Authorizing Provider  ACCU-CHEK GUIDE TEST test strip USE TO CHECK BLOOD SUGAR DAILY 02/01/24   Tower, Laine LABOR, MD  Accu-Chek Softclix Lancets lancets Check glucose level daily and as needed for diabetes type 2  (lancets for the soft clicks lancing device AccuChek meter) 09/04/23   Tower, Laine LABOR, MD  acetaminophen  (TYLENOL ) 325 MG tablet Take 650 mg by mouth every 6 (six) hours as needed.    [provider]  albuterol  (PROVENTIL  HFA;VENTOLIN  HFA) 108 (90 Base) MCG/ACT inhaler Inhale 2 puffs into the lungs every 4 (four) hours as needed for wheezing or shortness of breath. Please give spacer if possible 10/29/18   Tower, Laine A, MD  amLODipine  (NORVASC ) 5 MG tablet TAKE 1 TABLET (5 MG TOTAL) BY MOUTH DAILY. Patient taking differently: Take 5 mg by mouth daily. States not taking every day but will try again 06/16/24   Tower, Laine LABOR, MD  atorvastatin  (LIPITOR) 10 MG tablet TAKE 0.5 TABLETS (5 MG TOTAL) BY MOUTH EVERY OTHER DAY. 06/03/24   Tower, Laine LABOR, MD  blood glucose meter kit and supplies To check glucose daily and prn for DM2 E11.9 04/01/22   Tower, Laine LABOR, MD  Blood Pressure Monitoring (ADULT BLOOD PRESSURE CUFF LG) KIT Blood pressure cuff size large for the arm to check blood pressure daily and as needed for HTN Patient not taking: Reported on 07/22/2024 06/03/24   Tower, Laine LABOR, MD  chlorthalidone  (HYGROTON ) 25 MG tablet Take 1 tablet (25 mg total) by mouth daily. 09/04/23   Tower, Laine LABOR, MD  cyanocobalamin  (VITAMIN B12) 500 MCG tablet Take 500 mcg by mouth daily.    [provider]  EPINEPHrine (EPIPEN IJ) Inject as directed as needed. Reported on 12/05/2015    [provider]  fluticasone  (FLONASE ) 50 MCG/ACT nasal spray PLACE 2 SPRAYS INTO THE NOSE DAILY. 01/06/23   Tower, Laine LABOR, MD  folic acid  (FOLVITE ) 1 MG tablet Take 1 mg by mouth daily.    [provider]  glipiZIDE   (GLUCOTROL  XL) 2.5 MG 24 hr tablet Take 1 tablet (2.5 mg total) by mouth daily with breakfast. 09/04/23   Tower, Laine LABOR, MD  hydroxychloroquine (PLAQUENIL) 200 MG tablet Take 200 mg by mouth 2 (two) times daily. 12/30/22   [provider]  loratadine  (CLARITIN ) 10 MG tablet Take 10 mg by mouth daily. MAY TAKE AT HS ALSO    [provider]  methotrexate (RHEUMATREX) 2.5 MG tablet Take 15 mg by mouth once a week. Caution:Chemotherapy. Protect from light. ( Tuesday of each  week )    [provider]  ondansetron  (ZOFRAN ) 4 MG tablet Take 1 tablet (4 mg total) by mouth every 8 (eight) hours as needed for nausea or vomiting. Caution of sedation 05/27/23   Tower, Laine LABOR, MD  pantoprazole  (PROTONIX ) 40 MG tablet TAKE 1 TABLET BY MOUTH EVERY DAY 05/25/24   Tower, Laine LABOR, MD  polyethylene glycol (MIRALAX  / GLYCOLAX ) packet Take 17 g by mouth daily.    [provider]  potassium chloride  (KLOR-CON  M) 10 MEQ tablet TAKE 1 TABLET BY MOUTH TWICE A DAY 06/14/24   Tower, Laine LABOR, MD  triamcinolone  cream (KENALOG ) 0.1 % Apply 1 Application topically 2 (two) times daily as needed. On rash/affected area 01/15/24   Randeen Laine LABOR, MD    Physical Exam: Vitals:   07/29/24 1618 07/29/24 1745 07/29/24 1830 07/29/24 1845  BP:  (!) 179/147 (!) 159/63 (!) 172/93  Pulse:   (!) 47 (!) 52  Resp:  (!) 21 18 12   Temp:      SpO2:  97% 98% 98%  Weight: 69.4 kg     Height: 5' 7 (1.702 m)       Constitutional: NAD, calm, comfortable Vitals:   07/29/24 1618 07/29/24 1745 07/29/24 1830 07/29/24 1845  BP:  (!) 179/147 (!) 159/63 (!) 172/93  Pulse:   (!) 47 (!) 52  Resp:  (!) 21 18 12   Temp:      SpO2:  97% 98% 98%  Weight: 69.4 kg     Height: 5' 7 (1.702 m)      Eyes: PERRL, lids and conjunctivae normal ENMT: Mucous membranes are moist.   Neck: normal, supple, no masses, no thyromegaly Respiratory: clear to auscultation bilaterally, no wheezing, no crackles. Normal respiratory effort.  No accessory muscle use.  Cardiovascular: Regular rate and rhythm, no murmurs / rubs / gallops. No extremity edema.  Extremities warm. Abdomen: Mild diffuse abdominal tenderness-patient reported was new, denied bowel pain.  No masses palpated. No hepatosplenomegaly.   Musculoskeletal: no clubbing / cyanosis. No joint deformity upper and lower extremities.  Skin: no rashes, lesions, ulcers. No induration Neurologic: No facial asymmetry, moves extremities spontaneously, speech fluent. Psychiatric: Normal judgment and insight. Alert and oriented x 3. Normal mood.   Labs on Admission: I have personally reviewed following labs and imaging studies  CBC: Recent Labs  Lab 07/29/24 1639  WBC 4.2  HGB 11.9*  HCT 37.4  MCV 88.8  PLT 268   Basic Metabolic Panel: Recent Labs  Lab 07/29/24 1639  NA 141  K 3.4*  CL 106  CO2 22  GLUCOSE 86  BUN <5*  CREATININE 0.66  CALCIUM  8.7*   Thyroid  Function Tests: Recent Labs    07/29/24 1640  TSH 1.572   Anemia Panel: No results for input(s): VITAMINB12, FOLATE, FERRITIN, TIBC, IRON, RETICCTPCT in the last 72 hours. Urine analysis:    Component Value Date/Time   COLORURINE DARK YELLOW 04/11/2020 1115   APPEARANCEUR CLOUDY (A) 04/11/2020 1115   LABSPEC 1.025 04/11/2020 1115   PHURINE 5.0 04/11/2020 1115   GLUCOSEU NEGATIVE 04/11/2020 1115   HGBUR NEGATIVE 04/11/2020 1115   BILIRUBINUR 2 mg/dL 88/92/7975 8994   KETONESUR TRACE (A) 04/11/2020 1115   PROTEINUR Positive (A) 08/27/2023 1005   PROTEINUR NEGATIVE 04/11/2020 1115   UROBILINOGEN 1.0 08/27/2023 1005   NITRITE Negative 08/27/2023 1005   NITRITE NEGATIVE 01/21/2017 1214   LEUKOCYTESUR Large (3+) (A) 08/27/2023 1005    Radiological Exams on Admission: CT Head Wo  Contrast Result Date: 07/29/2024 EXAM: CT HEAD WITHOUT CONTRAST 07/29/2024 05:47:57 PM TECHNIQUE: CT of the head was performed without the administration of intravenous contrast. Automated exposure  control, iterative reconstruction, and/or weight based adjustment of the mA/kV was utilized to reduce the radiation dose to as low as reasonably achievable. COMPARISON: None available. CLINICAL HISTORY: Headache, new onset (Age >= 51y); HA HTN. FINDINGS: BRAIN AND VENTRICLES: Atherosclerotic calcifications are present in the cavernous carotid arteries bilaterally. No hyperdense vessel is present. No acute hemorrhage. No evidence of acute infarct. No hydrocephalus. No extra-axial collection. No mass effect or midline shift. ORBITS: Bilateral lens replacements are noted. The globes and orbits are otherwise within normal limits. SINUSES: Wall thickening is present in the left maxillary sinus suggesting history of chronic disease. No active sinus disease is present. SOFT TISSUES AND SKULL: No acute soft tissue abnormality. No skull fracture. IMPRESSION: 1. No acute intracranial abnormality. 2. Atherosclerotic calcifications in the cavernous carotid arteries bilaterally. No hyperdense vessel. Electronically signed by: Lonni Necessary MD 07/29/2024 05:59 PM EDT RP Workstation: [id number]   DG Chest Portable 1 View Result Date: 07/29/2024 CLINICAL DATA:  Bradycardia and hypertension EXAM: PORTABLE CHEST 1 VIEW COMPARISON:  Chest radiograph dated 08/26/2023 FINDINGS: Normal lung volumes. No focal consolidations. No pleural effusion or pneumothorax. Enlarged cardiomediastinal silhouette is likely projectional. No acute osseous abnormality. IMPRESSION: No active disease. Electronically Signed   By: Limin  Xu M.D.   On: 07/29/2024 17:18    EKG: Independently reviewed.  Sinus rhythm, rate 62, second-degree type I AV block.  QTc 492.  Assessment/Plan Principal Problem:   Bradycardia Active Problems:   Essential hypertension   RA (rheumatoid arthritis) (HCC)   Diabetes mellitus treated with oral medication (HCC)  Assessment and Plan: No notes have been filed under this hospital service. Service:  Hospitalist  Bradycardia, new diagnosis of atrial flutter-reported heart rate down to 30s and 40s, EKG shows second-degree type I AV block.  She is currently in sinus rhythm and heart rates has improved to the 70s.  Atrial flutter-new diagnosis, while patient was being evaluated by cardiology. - Appreciate cardiology recommendations-Chad Vascor of 6, started on IV heparin for now, will need DOAC prior to discharge, if any worsening bradycardia or AV conduction may need PPM.  EP to see tomorrow. -Echocardiogram - Potassium 3.4, TSH normal, magnesium -1.7 - Addendum.  Patient with initial mild tenderness on exam, now complaining of worsening abdominal pain, while in the ED.  Will hold heparin drip transiently pending results of CT abdomen and pelvis with contrast.    Hypertensive urgency-blood pressure up to 204/73, improved after 10 mg of hydralazine.  Reported headache- head CT negative for acute abnormality.  Has not been compliant with Norvasc , she is unsure if she is taking chlorthalidone  also. -IV hydralazine 10 mg for systolic greater than 180 - Resume Norvasc  5mg ,   Chest pressure -EKG without significant ST or T wave changes, troponin 16>> 22. -Evaluated by cardiology, may benefit from ischemic evaluation, further recs pending echo and cardiac rhythm  Diabetes mellitus-A1c 6.1- 12/02/23. - SSI- S - Hold glipizide   Rheumatoid arthritis- chronic unchanged pain.  On methotrexate and Plaquenil. -Resume Plaquenil   DVT prophylaxis: Heparin gtt Code Status: Full code, confirmed with patient and family at bedside Family Communication: Spouse, daughter and son at bedside. Disposition Plan: ~ 2 days Consults called: Cardiology Admission status: Inpatient, telemetry I certify that at the point of admission it is my clinical judgment that the patient will require inpatient hospital care spanning  beyond 2 midnights from the point of admission due to high intensity of service, high risk for  further deterioration and high frequency of surveillance required.   Author: Tully FORBES Carwin, MD 07/29/2024 12:29 AM  For on call review [email or web address].

## 2024-07-29 NOTE — ED Provider Triage Note (Signed)
 Emergency Medicine Provider Triage Evaluation Note  [name] , a 83 y.o. female  was evaluated in triage.  Pt complains of bradycardia, hypertension. Reports that she has been feeling generally unwell for the past few days. Seen at urgent care, had abnormal EKG and was sent here. Reports she has had her blood pressure medications. Reports chest pain yesterday but none today.  Does report a headache, denies any vision changes.  Reports that her headache is similar in nature to headache she has had before that she attributed to being from her pain from her arthritis.  Reports generalized weakness and fatigue.  Denies fevers or chills.  Review of Systems  Positive:  Negative:   Physical Exam  BP (!) 204/73   Temp 97.6 F (36.4 C)   Resp 18   Ht 5' 7 (1.702 m)   Wt 69.4 kg   BMI 23.96 kg/m  Gen:   Awake, no distress   Resp:  Normal effort  MSK:   Moves extremities without difficulty  Other:    Medical Decision Making  Medically screening exam initiated at 4:40 PM.  Appropriate orders placed.  [name] was informed that the remainder of the evaluation will be completed by another provider, this initial triage assessment does not replace that evaluation, and the importance of remaining in the ED until their evaluation is complete.     Nora Lauraine LABOR, PA-C 07/29/24 1642

## 2024-07-29 NOTE — Progress Notes (Signed)
 Subjective Patient ID: [name] is a 83 y.o. female.    [name] is a 83 y.o. female presents to the clinic complaining of headache and with concerns of high blood pressure and low heart rate.  Patient resting heart rate was 40 beats per minute with blood pressure was 180/70 when she was assessed by a provider from [facility].  Patient was not currently having symptoms.  She was encouraged to be seen in urgent care if she developed symptoms.  Patient had not yet had her morning amlodipine .  She took her medication around 9:30-10AM.  Patient developed headache but thinks this was caused by taking medication on an empty stomach.  Denies chest pain, shortness of breath, syncope, lightheadedness, dizziness.  PMHx significant for type II DM, hypertension, rheumatoid and osteoarthritis.     History provided by:  Patient Language interpreter used: No     Review of Systems  Eyes:  Negative for photophobia and visual disturbance.  Respiratory:  Negative for shortness of breath.   Cardiovascular:  Negative for chest pain and palpitations.  Neurological:  Positive for headaches. Negative for dizziness, syncope, speech difficulty, weakness, light-headedness and numbness.    Patient History  Allergies: Allergies  Allergen Reactions  . Bee Venom Anaphylaxis  . Other Anaphylaxis  . Hydrochlorothiazide  Other    Felt weak all over   Felt weak all over  . Adalimumab     LIPS SWELLED AND RASH  . Cetirizine Other    Doesn't help  . Dicyclomine      Blurry vision  . Metformin  And Related     rash    History reviewed. No pertinent past medical history. History reviewed. No pertinent surgical history. Social History   Socioeconomic History  . Marital status: Not on file    Spouse name: Not on file  . Number of children: Not on file  . Years of education: Not on file  . Highest education level: Not on file  Occupational History  . Not on file  Tobacco Use  . Smoking status:  Never  . Smokeless tobacco: Never  Substance and Sexual Activity  . Alcohol use: Not on file  . Drug use: Not on file  . Sexual activity: Not on file  Other Topics Concern  . Not on file  Social History Narrative  . Not on file   No family history on file. Current Outpatient Medications on File Prior to Visit  Medication Sig Dispense Refill  . amLODIPine  (Norvasc ) 5 MG tablet Take 5 mg by mouth 1 (one) time each day.    . folic acid  (Folvite ) 1 MG tablet Take 1 mg by mouth 1 (one) time each day.    . glipiZIDE  XL (Glucotrol  XL) 2.5 MG 24 hr tablet Take 2.5 mg by mouth.    . hydroxychloroquine (Plaquenil) 200 MG tablet Take 200 mg by mouth.    . pantoprazole  (ProtoNix ) 40 MG EC tablet Take 40 mg by mouth 1 (one) time each day.    . potassium chloride  CR (Klor-Con  M10) 10 MEQ ER tablet Take 10 mEq by mouth in the morning and 10 mEq before bedtime.     No current facility-administered medications on file prior to visit.    Objective  Vitals:   07/29/24 1349 07/29/24 1406  BP: (!) 201/70 (!) 181/75  BP Location: Left arm Right arm  Pulse: 61   Resp: 18   Temp: 36.4 C (97.6 F)   TempSrc: Oral   SpO2: 96%   Weight:  73.9 kg   Height: 5' 7   PainSc:   7   LMP date is Unknown            No results found.  Physical Exam Vitals and nursing note reviewed.  Constitutional:      General: She is not in acute distress.    Appearance: Normal appearance. She is not ill-appearing.  HENT:     Head: Normocephalic.     Mouth/Throat:     Lips: Pink.     Mouth: Mucous membranes are moist.  Eyes:     Conjunctiva/sclera: Conjunctivae normal.  Cardiovascular:     Rate and Rhythm: Bradycardia present. Rhythm irregular.     Heart sounds: Normal heart sounds.  Pulmonary:     Effort: Pulmonary effort is normal.     Breath sounds: Normal breath sounds and air entry.     Comments: Patient speaking in full sentences, no increased work of breathing when ambulating. Skin:     General: Skin is warm and dry.  Neurological:     Mental Status: She is alert.  Psychiatric:        Mood and Affect: Mood normal.        Behavior: Behavior normal. Behavior is cooperative.     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  Interpretation by Gerard Gaskins, PA        Procedures MDM:     1+ Acute illness or injury that poses a threat to life or bodily function     Explanation of Medical Decision Making and variances from expected care:    Patient initial BP 201/70, repeat 181/75 despite her taking prescribed medication.  HR is slow and irregular.  ECG obtained and there is concern for 3rd degree AV block with junctional escape beats vs variable 2nd degree.  No evidence of ischemia or infarction.  Patient is well appearing on exam which is reassuring.  Discussed EMS with patient and benefits vs risks of POV vs EMS transport.  Patient agreeable to EMS transport.  EMS arrived and evaluated patient, but did not transport.  Patient and family prefer to go to [facility] and EMS will not transport patient to preferred facility.  Patient states she will go POV with her husband driving her.  Patient verbalized her understanding of risks of POV transport and understands that should her condition acutely change to pull over and contact 911 for immediate assistance.     Assessment requiring historian other than patient: No     Independent visualization of image, tracing, or test: No     Discussion of management with another provider: No     Risk:: High             Assessment/Plan Diagnoses and all orders for this visit:  Hypertensive urgency -     ECG 12 lead  Bradycardia -     ECG 12 lead  Bad headache     Disposition Status: Emergency Department  Patient Instructions  Discharge Summary - Transfer to Emergency Department Reason for Transfer: Patient is being transferred to the Emergency Department for further evaluation and  management due to concerning vital signs, specifically significant hypertension and bradycardia, which may indicate an underlying cardiac or neurological condition requiring advanced diagnostic workup and monitoring. Clinical Concerns: Hypertension (e.g., BP >180/110) may increase risk for acute target organ damage (e.g., stroke, aortic dissection, hypertensive encephalopathy). Bradycardia (e.g., HR <50 bpm), particularly if symptomatic or persistent, raises concern for: High-grade AV block or sick sinus syndrome Medication-related bradycardia (e.g., beta blockers, calcium  channel blockers) Intracranial pathology (e.g., elevated intracranial pressure) Electrolyte abnormalities Myocardial ischemia/infarction Rationale for ED Transfer: Urgent evaluation with EKG, labs (e.g., troponin, electrolytes), and possibly imaging (e.g., CT head, chest imaging) is warranted. May require cardiac monitoring, IV antihypertensives, or pacing in unstable cases. Not appropriate for outpatient or urgent care management due to potential for rapid decompensation.  Progress note signed by Gerard Gaskins, PA on 07/29/24 at  3:27 PM

## 2024-07-29 NOTE — Telephone Encounter (Signed)
 No appts today will route to PCP for review

## 2024-07-29 NOTE — Consult Note (Addendum)
 Cardiology Consultation   Patient ID: [name] MRN: [id number]; DOB: 1940-11-22  Admit date: 07/29/2024 Date of Consult: 07/29/2024  PCP:  Randeen Laine LABOR, MD   [facility] Providers Cardiologist: New (Dr. Jeffrie)   Patient Profile: [name] is a 83 y.o. female with a history of coronary calcifications noted on prior CT, SVT s/p ablation, hypertension, hyperlipidemia, GERD, and rheumatic arthritis who is being seen 07/29/2024 for the evaluation of bradycardia at the request of Dr. Pamella.  History of Present Illness: [name] is a 83 year old female with the above history. She has a history of SVT s/p remote ablation in the 1980 at [facility]. She was seen by Dr. Okey in 11/2015 for for further evaluation of coronary calcifications noted on a CT. She reported some atypical chest pain at that time but it was felt to be musculoskeletal in nature. No additional work-up was felt to be necessary at that time. She has not been seen by Cardiology since then. Echo in 09/2016 showed LVEF of 65-70% with normal wall motion and mild MR.  Patient presented to the ED today for further evaluation of bradycardaa an hypertension. She works with home PT. They came today and she told them that she was having some chest pressure. They checked her BP and it was markedly elevated. They advised that she go to the ED but she went to an Urgent Care instead. At the Urgent Care, she was noted to be markedly hypertensive (systolic BP in the 180s) and bradycardia (heart rates in the 30s). Therefore, she was sent to the ED for further evaluation.  She reports occasional chest pain that she describes as pressure sensation that will occur ever ow and then but then go way. Pain occurs at rest and will last a couple of minutes at a time. Difficult to say how long she has been having this as she is somewhat of a poor historian. However, it sounds like she has had some intermittent mild chest pain  before. She describes some dysphagia and it sounds like she has had some esophageal strictures before with possible dilatation of this in the past. No significant shortness of breath. No dizziness or syncope. She has a lot of leg weakness from her rheumatoid arthritis and has had some mechanical falls in the past because of this. She has a previously history of GI bleed in 2018 requiring 1 unit of PRBCs. EGD/ colonoscopy at that time were unremarkable and bleeding was felt to likely be diverticular. No recurrence GI bleeding. No other abnormal bleeding.   Upon arrival to the ED, she was hypertensive with BP of 204/73 and bradycardic with rates 40s. EKGs showed 2nd degree Type 1 and some 2:1 AV block. However, while we were in the ED, she converted to slow atrial flutter with rates in the 40s. High-sensitivity troponin 16 >>22. WBC 4.2, Hgb 11.0, Plts 268. Na 141, K 3.4, Glucose 86, BUN <5, Cr 0.66. TSH normal.    Past Medical History:  Diagnosis Date   Allergic rhinitis    Cataract 2015   bilateral   COVID-19 10/2019   ASYMPTOMATIC   Diabetes mellitus without complication (HCC)    Diverticulosis    Esophageal reflux    Essential hypertension 01/07/2016   Fatty liver 2018   Fibula fracture 02/2005   Former smoker    Gastritis 2003   Heart murmur    MILD, NO CARDIOLOGIST   Hemorrhoids    History of cardiac dysrhythmia  2011   ABLATION  AT [facility]   History of kidney stones YRS AGO   Hyperglycemia    Hyperlipemia    Osteoarthritis    Personal history of colonic polyps 1998   hyperplastic   PMB (postmenopausal bleeding)    Pre-diabetes    BORDERLINE DIET CONTROLLED DOES NOT CHECK CBG   RA (rheumatoid arthritis) (HCC)    Rectal bleed 2018   TRANSFUSION GIVEN   Shingles 2012   Uterine fibroid     Past Surgical History:  Procedure Laterality Date   CHOLECYSTECTOMY  YRS AGO   COLONOSCOPY  11/08   internal hemorrhoids   DILATATION & CURETTAGE/HYSTEROSCOPY WITH MYOSURE N/A  07/06/2020   Procedure: DILATATION & CURETTAGE/HYSTEROSCOPY;  Surgeon: Arnaldo Purchase, MD;  Location: [facility];  Service: Gynecology;  Laterality: N/A;  request 7:30am OR time in [place] Gyn block requests 30 minutes   EP procedure  1994   SVT; normal echo   ESOPHAGOGASTRODUODENOSCOPY  11/03   reactive gastropathy   EYE SURGERY Bilateral 2017   CATARACTS   LIPOMA EXCISION  11/2002   SVT s/p surgery--? ablation  2011   AT [facility]   TUBAL LIGATION  YRS AGO   vaginal polyp excised  12/2000   benign     Home Medications:  Prior to Admission medications   Medication Sig Start Date End Date Taking? Authorizing Provider  ACCU-CHEK GUIDE TEST test strip USE TO CHECK BLOOD SUGAR DAILY 02/01/24   Tower, Laine LABOR, MD  Accu-Chek Softclix Lancets lancets Check glucose level daily and as needed for diabetes type 2  (lancets for the soft clicks lancing device AccuChek meter) 09/04/23   Tower, Laine LABOR, MD  acetaminophen  (TYLENOL ) 325 MG tablet Take 650 mg by mouth every 6 (six) hours as needed.    [provider]  albuterol  (PROVENTIL  HFA;VENTOLIN  HFA) 108 (90 Base) MCG/ACT inhaler Inhale 2 puffs into the lungs every 4 (four) hours as needed for wheezing or shortness of breath. Please give spacer if possible 10/29/18   Tower, Laine LABOR, MD  amLODipine  (NORVASC ) 5 MG tablet TAKE 1 TABLET (5 MG TOTAL) BY MOUTH DAILY. Patient taking differently: Take 5 mg by mouth daily. States not taking every day but will try again 06/16/24   Tower, Laine LABOR, MD  atorvastatin  (LIPITOR) 10 MG tablet TAKE 0.5 TABLETS (5 MG TOTAL) BY MOUTH EVERY OTHER DAY. 06/03/24   Tower, Laine LABOR, MD  blood glucose meter kit and supplies To check glucose daily and prn for DM2 E11.9 04/01/22   Tower, Laine LABOR, MD  Blood Pressure Monitoring (ADULT BLOOD PRESSURE CUFF LG) KIT Blood pressure cuff size large for the arm to check blood pressure daily and as needed for HTN Patient not taking: Reported on 07/22/2024 06/03/24    Tower, Laine LABOR, MD  chlorthalidone  (HYGROTON ) 25 MG tablet Take 1 tablet (25 mg total) by mouth daily. 09/04/23   Tower, Laine LABOR, MD  cyanocobalamin  (VITAMIN B12) 500 MCG tablet Take 500 mcg by mouth daily.    [provider]  EPINEPHrine (EPIPEN IJ) Inject as directed as needed. Reported on 12/05/2015    [provider]  fluticasone  (FLONASE ) 50 MCG/ACT nasal spray PLACE 2 SPRAYS INTO THE NOSE DAILY. 01/06/23   Tower, Laine LABOR, MD  folic acid  (FOLVITE ) 1 MG tablet Take 1 mg by mouth daily.    [provider]  glipiZIDE  (GLUCOTROL  XL) 2.5 MG 24 hr tablet Take 1 tablet (2.5 mg total) by mouth daily with  breakfast. 09/04/23   Tower, Laine LABOR, MD  hydroxychloroquine (PLAQUENIL) 200 MG tablet Take 200 mg by mouth 2 (two) times daily. 12/30/22   [provider]  loratadine  (CLARITIN ) 10 MG tablet Take 10 mg by mouth daily. MAY TAKE AT HS ALSO    [provider]  methotrexate (RHEUMATREX) 2.5 MG tablet Take 15 mg by mouth once a week. Caution:Chemotherapy. Protect from light. ( Tuesday of each week )    [provider]  ondansetron  (ZOFRAN ) 4 MG tablet Take 1 tablet (4 mg total) by mouth every 8 (eight) hours as needed for nausea or vomiting. Caution of sedation 05/27/23   Tower, Laine LABOR, MD  pantoprazole  (PROTONIX ) 40 MG tablet TAKE 1 TABLET BY MOUTH EVERY DAY 05/25/24   Tower, Laine LABOR, MD  polyethylene glycol (MIRALAX  / GLYCOLAX ) packet Take 17 g by mouth daily.    [provider]  potassium chloride  (KLOR-CON  M) 10 MEQ tablet TAKE 1 TABLET BY MOUTH TWICE A DAY 06/14/24   Tower, Laine LABOR, MD  triamcinolone  cream (KENALOG ) 0.1 % Apply 1 Application topically 2 (two) times daily as needed. On rash/affected area 01/15/24   Tower, Laine LABOR, MD    Scheduled Meds:  heparin  3,500 Units Intravenous Once   hydrALAZINE  10 mg Intravenous Once   Continuous Infusions:  heparin     PRN Meds:   Allergies:    Allergies  Allergen Reactions   Bee Venom  Anaphylaxis   Dicyclomine      Blurry vision   Humira [Adalimumab]     LIPS SWELLED AND RASH   Metformin  And Related     rash    Social History:   Social History   Socioeconomic History   Marital status: Married    Spouse name: Not on file   Number of children: 3   Years of education: Not on file   Highest education level: Not on file  Occupational History   Occupation: retired    Associate Professor: RETIRED  Tobacco Use   Smoking status: Former    Current packs/day: 0.00    Average packs/day: 0.8 packs/day for 15.0 years (11.3 ttl pk-yrs)    Types: Cigarettes    Start date: 10/20/1988    Quit date: 10/21/2003    Years since quitting: 20.7   Smokeless tobacco: Never  Vaping Use   Vaping status: Never Used  Substance and Sexual Activity   Alcohol use: No    Alcohol/week: 0.0 standard drinks of alcohol   Drug use: No   Sexual activity: Yes    Comment: 1st intercourse 83 yo-Fewer than 5 partners  Other Topics Concern   Not on file  Social History Narrative   Married      Retired      Occasional caffeine      No regular exercise   Social Drivers of Corporate investment banker Strain: Low Risk  (11/17/2023)   Overall Financial Resource Strain (CARDIA)    Difficulty of Paying Living Expenses: Not hard at all  Food Insecurity: No Food Insecurity (06/24/2024)   Hunger Vital Sign    Worried About Running Out of Food in the Last Year: Never true    Ran Out of Food in the Last Year: Never true  Transportation Needs: No Transportation Needs (06/24/2024)   PRAPARE - Administrator, Civil Service (Medical): No    Lack of Transportation (Non-Medical): No  Physical Activity: Inactive (11/17/2023)   Exercise Vital Sign  Days of Exercise per Week: 0 days    Minutes of Exercise per Session: 0 min  Stress: No Stress Concern Present (11/17/2023)   [employer] of Occupational Health - Occupational Stress Questionnaire    Feeling of Stress : Not at all  Social Connections:  Moderately Isolated (11/17/2023)   Social Connection and Isolation Panel    Frequency of Communication with Friends and Family: More than three times a week    Frequency of Social Gatherings with Friends and Family: More than three times a week    Attends Religious Services: Never    Database administrator or Organizations: No    Attends Banker Meetings: Never    Marital Status: Married  Catering manager Violence: Not At Risk (06/24/2024)   Humiliation, Afraid, Rape, and Kick questionnaire    Fear of Current or Ex-Partner: No    Emotionally Abused: No    Physically Abused: No    Sexually Abused: No    Family History:   Family History  Problem Relation Age of Onset   Colon cancer Father 20   Diabetes Mother    Heart disease Mother    Kidney disease Mother        ESRD   Stroke Mother    Hypertension Brother    Breast cancer Sister 30   Diabetes Other        nephews   Colon cancer Sister    Esophageal cancer Neg Hx    Stomach cancer Neg Hx    Rectal cancer Neg Hx      ROS:  Please see the history of present illness.    Physical Exam/Data: Vitals:   07/29/24 1845 07/29/24 1900 07/29/24 1930 07/29/24 1945  BP: (!) 172/93 (!) 156/53 (!) 169/62 (!) 154/60  Pulse: (!) 52 (!) 26 (!) 44 (!) 51  Resp: 12 17 (!) 21 14  Temp:      SpO2: 98% 97% 98% 98%  Weight:      Height:       No intake or output data in the 24 hours ending 07/29/24 2016    07/29/2024    4:18 PM 07/22/2024   11:12 AM 06/03/2024    1:55 PM  Last 3 Weights  Weight (lbs) 153 lb 153 lb --  Weight (kg) 69.4 kg 69.4 kg --     Body mass index is 23.96 kg/m.   Physical Exam per MD: General: 83 y.o. thin African-American female resting comfortably in no acute distress. HEENT: Normocephalic and atraumatic. Sclera clear.  Neck: Supple. No carotid bruits. No JVD.  Heart: RRR. II/VI systolic murmur. Distinct S1 and S2. No murmurs, gallops, or rubs. Radial and distal pedal pulses 2+ and equal  bilaterally. Lungs: No increased work of breathing.  Mild coarse crackles noted in bilateral bases.  Extremities: No lower extremity edema.    Skin: Warm and dry. Neuro:  No focal deficits. Psych: Normal affect.   EKG:  The EKG was personally reviewed and demonstrates:  See assessment/ plan Telemetry:  Telemetry was personally reviewed and demonstrates:  See assessment/ plan  Relevant CV Studies:  Echocardiogram 10/06/2016: Study Conclusions  - Left ventricle: The cavity size was normal. There was moderate    concentric hypertrophy. Systolic function was vigorous. The    estimated ejection fraction was in the range of 65% to 70%. Wall    motion was normal; there were no regional wall motion    abnormalities. Doppler parameters are consistent with abnormal  left ventricular relaxation (grade 1 diastolic dysfunction). The    E/e&' ratio is between 8-15, suggesting indeterminate LV filling    pressure.  - Mitral valve: Mildly thickened leaflets . There was mild    regurgitation.  - Left atrium: The atrium was normal in size.  - Tricuspid valve: There was mild regurgitation.  - Pulmonary arteries: PA peak pressure: 31 mm Hg (S).  - Inferior vena cava: The vessel was normal in size. The    respirophasic diameter changes were in the normal range (= 50%),    consistent with normal central venous pressure.   Impressions:  - LVEF 55-60%, moderate LVH, normal wall motion, diastolic    dysfunction, indeterminate LV filling pressure, mild MR, normal    LA size, mild TR, RVSP 31 mmHg, normal IVC.    Laboratory Data: High Sensitivity Troponin:   Recent Labs  Lab 07/29/24 1639 07/29/24 1822  TROPONINIHS 16 22*     Chemistry Recent Labs  Lab 07/29/24 1639 07/29/24 1822  NA 141  --   K 3.4*  --   CL 106  --   CO2 22  --   GLUCOSE 86  --   BUN <5*  --   CREATININE 0.66  --   CALCIUM  8.7*  --   MG  --  1.7  GFRNONAA >60  --   ANIONGAP 13  --     No results for input(s):  PROT, ALBUMIN, AST, ALT, ALKPHOS, BILITOT in the last 168 hours. Lipids No results for input(s): CHOL, TRIG, HDL, LABVLDL, LDLCALC, CHOLHDL in the last 168 hours.  Hematology Recent Labs  Lab 07/29/24 1639  WBC 4.2  RBC 4.21  HGB 11.9*  HCT 37.4  MCV 88.8  MCH 28.3  MCHC 31.8  RDW 17.0*  PLT 268   Thyroid   Recent Labs  Lab 07/29/24 1640  TSH 1.572    BNPNo results for input(s): BNP, PROBNP in the last 168 hours.  DDimer No results for input(s): DDIMER in the last 168 hours.  Radiology/Studies:  CT Head Wo Contrast Result Date: 07/29/2024 EXAM: CT HEAD WITHOUT CONTRAST 07/29/2024 05:47:57 PM TECHNIQUE: CT of the head was performed without the administration of intravenous contrast. Automated exposure control, iterative reconstruction, and/or weight based adjustment of the mA/kV was utilized to reduce the radiation dose to as low as reasonably achievable. COMPARISON: None available. CLINICAL HISTORY: Headache, new onset (Age >= 51y); HA HTN. FINDINGS: BRAIN AND VENTRICLES: Atherosclerotic calcifications are present in the cavernous carotid arteries bilaterally. No hyperdense vessel is present. No acute hemorrhage. No evidence of acute infarct. No hydrocephalus. No extra-axial collection. No mass effect or midline shift. ORBITS: Bilateral lens replacements are noted. The globes and orbits are otherwise within normal limits. SINUSES: Wall thickening is present in the left maxillary sinus suggesting history of chronic disease. No active sinus disease is present. SOFT TISSUES AND SKULL: No acute soft tissue abnormality. No skull fracture. IMPRESSION: 1. No acute intracranial abnormality. 2. Atherosclerotic calcifications in the cavernous carotid arteries bilaterally. No hyperdense vessel. Electronically signed by: Lonni Necessary MD 07/29/2024 05:59 PM EDT RP Workstation: [id number]   DG Chest Portable 1 View Result Date: 07/29/2024 CLINICAL DATA:   Bradycardia and hypertension EXAM: PORTABLE CHEST 1 VIEW COMPARISON:  Chest radiograph dated 08/26/2023 FINDINGS: Normal lung volumes. No focal consolidations. No pleural effusion or pneumothorax. Enlarged cardiomediastinal silhouette is likely projectional. No acute osseous abnormality. IMPRESSION: No active disease. Electronically Signed   By: Limin  Xu M.D.   On:  07/29/2024 17:18     Assessment and Plan:  Bradycardiac Atrial Flutter Patient noted to be bradycardic on arrival. EKG and telemetry show 2nd degree type 1 AV block and some 2:1 conduction. While we were in ED, she went into atrial flutter. Rates in the 40s. She reports some chest pressure earlier today but no lightheadedness/ dizziness or near/ syncope with this. - Potassium 3.4. Will replete.  - TSH normal - Will check Magnesium . - Will order Echo. - She is not on any AV nodal agents.  - CHA2DS2-VASc = 6 (HTN, DM, coronary calfications, age x2, female). Will start IV Heparin for now but will need DOAC prior to discharge. - Will continue to monitor on telemetry. If she has any worsening bradycardia or AV conduction, may require PPM. Will have EP see tomorrow.  Chest Pressure Patient is a difficult historian but does report some chest pressure earlier today. It also sounds like she has had some intermittent chest pressure in the past. EKG shows no acute ischemic changes. High-sensitivity troponin 16 >> 22 consistent with demand ischemic in setting of marked hypertension and bradycardia.  - Will check Echo. - She is a difficult historian. She does reports some chest pressure and has coronary calcification noted on prior CT. She may benefit from an ischemic evaluation. Further recommendations depending on Echo and what rhythm does.   Hypertensive Urgency BP markedly elevated on arrival at 204/73.  - Continue home Amlodipine  5mg  daily. - Will start PO Hydralazine 25mg  TID for now. - Can allow for some degree of permissive  hypertension given her bradycardia.  History of SVT S/p remote ablation in the 1980s.   Otherwise, per primary team: - Type 2 diabetes  - Rheumatoid arthritis  - GERD - Dysphagia    Risk Assessment/Risk Scores:   CHA2DS2-VASc Score = 6   This indicates a 9.7% annual risk of stroke. The patient's score is based upon: CHF History: 0 HTN History: 1 Diabetes History: 1 Stroke History: 0 Vascular Disease History: 1 Age Score: 2 Gender Score: 1      For questions or updates, please contact [facility] Please consult [email or web address] for contact info under      Signed, Amy E Goodrich, PA-C  07/29/2024 8:16 PM  Personally seen and examined. Agree with above.  83 year old sent over from urgent care secondary to bradycardia heart rates in the 30s and 40s, second-degree heart block type I, 2-1 AV block, sinus bradycardia and now atrial flutter with slow ventricular conduction in the 40s.  She also was experiencing hypertension with systolics in the 200 range and mild headache.  Head CT unremarkable except for calcified plaque in carotids.  Here in the ER she is surrounded by her family.  She states that she has not had any syncopal episodes but she does battle with rheumatoid arthritis and weakness in the legs and sometimes has fallen at home.  She also states that she has not had any syncopal episodes.  She does describe some central chest discomfort that comes and goes usually lasting several minutes in duration can be nonexertional.  She has a prior history of GERD.  Telemetry personally reviewed as described above.  EKG personally reviewed as described above.  Past history includes diabetes, no stroke, carotid plaque, hypertension, age greater than 4, female.  She also has a history of GI bleed in 2018 likely diverticular but EGD and colonoscopy were markable.  She was transfused 1 unit  She is not on any AV nodal  blocking agents.  She does take amlodipine  at  home but medication compliance has been questioned in the past.  Remote history of SVT ablation at [facility] many years ago.  Exam: Bradycardic irregular 2/6 systolic murmur no carotid bruits lungs with crackles at bases pleasant answers questions but sometimes looks to family for response.  No edema.  Normal distal pulses.  Labs potassium 3.4 creatinine normal, TSH normal, hemoglobin 11.9.  Assessment and plan:  Bradycardia Newly discovered atrial flutter Second degree heart block/2-1 AV block Central chest discomfort, troponin 22 Hypertensive urgency  -IV hydralazine 10 mg has been administered.  Use p.o. hydralazine 25 3 times daily for now. -Continue with home amlodipine  (she may or may not have been taking) -She is on not on any AV nodal blocking agents.  Continue with telemetry monitoring.  May require pacemaker implantation.  Consult EP in the morning. -Check echocardiogram, systolic murmur heard. -TSH normal -Start IV heparin for new onset atrial flutter -Consider cardiac catheterization to ensure that she does not have any high degree stenosis especially in the ostial RCA region that could contribute to this AV delay as well as occasional chest discomfort.  Minimally elevated troponin most likely compatible with hypertensive urgency however.  Discussed risks and benefits of cardiac catheterization including stroke heart attack death bleeding renal impairment, willing to proceed.  Family present. -No urgency/temp wire placement.  Mentating well. -Monitor diabetes rheumatoid arthritis GERD and dysphagia.  We will follow along closely.  Oneil Parchment, MD

## 2024-07-29 NOTE — Telephone Encounter (Signed)
 FYI Only or Action Required?: Action required by provider: request for appointment and update on patient condition.  Patient was last seen in primary care on 06/03/2024 by Randeen Laine LABOR, MD.  Called Nurse Triage reporting Bradycardia and Hypertension.  Symptoms began today.  Interventions attempted: Nothing.  Symptoms are: unchanged.  Triage Disposition: See PCP When Office is Open (Within 3 Days)  Patient/caregiver understands and will follow disposition?: No, refuses disposition [facility] requesting sdv    Copied from CRM [id number]. Topic: Clinical - Red Word Triage >> Jul 29, 2024  9:53 AM Drema MATSU wrote: Red Word that prompted transfer to Nurse Triage: Patient resting heart rate was 40 beats per minute with blood pressure was 180/70. Chyrl stated that no exercised was performed. Reason for Disposition  Systolic BP >= 160 OR Diastolic >= 100  Answer Assessment - Initial Assessment Questions Additional info: 1) Chyrl from [facility] calling to request sdv on patient behalf. He arrived 40 minutes ago, patient had just woken up, vital signs revealed bradycardia and hypertension, she had not taken her morning medication at that time. She is asymptomatic and reporting felling well other than her usual pain. Schedule reviewed and no appointments are available today. Family is requesting work in visit with any provider. Aware to proceed to urgent care if she develops symptoms, heart rate decrease further or blood pressure increases. Requesting call back today to let them know if she can be worked in or to proceed to urgent care [phone]  1. BLOOD PRESSURE: What is your blood pressure? Did you take at least two measurements 5 minutes apart?     180/70 resting heart rate 40 2. ONSET: When did you take your blood pressure?     40 minutes ago 3. HOW: How did you take your blood pressure? (e.g., automatic home BP monitor, visiting nurse)     [facility]yrl from [facility]  4. HISTORY: Do you  have a history of high blood pressure?     yes 5. MEDICINES: Are you taking any medicines for blood pressure? Have you missed any doses recently?     No missed doses, had not taken morning dose  6. OTHER SYMPTOMS: Do you have any symptoms? (e.g., blurred vision, chest pain, difficulty breathing, headache, weakness)     Ongoing neck pain 7. PREGNANCY: Is there any chance you are pregnant? When was your last menstrual period?  Protocols used: Blood Pressure - High-A-AH

## 2024-07-29 NOTE — Telephone Encounter (Signed)
 Aware, will watch for correspondence

## 2024-07-30 ENCOUNTER — Inpatient Hospital Stay (HOSPITAL_COMMUNITY)

## 2024-07-30 ENCOUNTER — Other Ambulatory Visit (HOSPITAL_COMMUNITY)

## 2024-07-30 DIAGNOSIS — I4892 Unspecified atrial flutter: Secondary | ICD-10-CM | POA: Diagnosis not present

## 2024-07-30 DIAGNOSIS — E119 Type 2 diabetes mellitus without complications: Secondary | ICD-10-CM | POA: Diagnosis not present

## 2024-07-30 DIAGNOSIS — Z91199 Patient's noncompliance with other medical treatment and regimen due to unspecified reason: Secondary | ICD-10-CM | POA: Diagnosis not present

## 2024-07-30 DIAGNOSIS — Z87891 Personal history of nicotine dependence: Secondary | ICD-10-CM | POA: Diagnosis not present

## 2024-07-30 DIAGNOSIS — E876 Hypokalemia: Secondary | ICD-10-CM | POA: Diagnosis present

## 2024-07-30 DIAGNOSIS — Z888 Allergy status to other drugs, medicaments and biological substances status: Secondary | ICD-10-CM | POA: Diagnosis not present

## 2024-07-30 DIAGNOSIS — R1310 Dysphagia, unspecified: Secondary | ICD-10-CM | POA: Diagnosis not present

## 2024-07-30 DIAGNOSIS — I160 Hypertensive urgency: Secondary | ICD-10-CM | POA: Diagnosis not present

## 2024-07-30 DIAGNOSIS — R001 Bradycardia, unspecified: Secondary | ICD-10-CM | POA: Diagnosis not present

## 2024-07-30 DIAGNOSIS — Z8249 Family history of ischemic heart disease and other diseases of the circulatory system: Secondary | ICD-10-CM | POA: Diagnosis not present

## 2024-07-30 DIAGNOSIS — I441 Atrioventricular block, second degree: Secondary | ICD-10-CM | POA: Diagnosis not present

## 2024-07-30 DIAGNOSIS — Z7984 Long term (current) use of oral hypoglycemic drugs: Secondary | ICD-10-CM | POA: Diagnosis not present

## 2024-07-30 DIAGNOSIS — Z91030 Bee allergy status: Secondary | ICD-10-CM | POA: Diagnosis not present

## 2024-07-30 DIAGNOSIS — Z9049 Acquired absence of other specified parts of digestive tract: Secondary | ICD-10-CM | POA: Diagnosis not present

## 2024-07-30 DIAGNOSIS — K219 Gastro-esophageal reflux disease without esophagitis: Secondary | ICD-10-CM | POA: Diagnosis not present

## 2024-07-30 DIAGNOSIS — I2510 Atherosclerotic heart disease of native coronary artery without angina pectoris: Secondary | ICD-10-CM | POA: Diagnosis not present

## 2024-07-30 DIAGNOSIS — Z833 Family history of diabetes mellitus: Secondary | ICD-10-CM | POA: Diagnosis not present

## 2024-07-30 DIAGNOSIS — I159 Secondary hypertension, unspecified: Secondary | ICD-10-CM | POA: Diagnosis not present

## 2024-07-30 DIAGNOSIS — K5790 Diverticulosis of intestine, part unspecified, without perforation or abscess without bleeding: Secondary | ICD-10-CM | POA: Diagnosis not present

## 2024-07-30 DIAGNOSIS — Z950 Presence of cardiac pacemaker: Secondary | ICD-10-CM | POA: Diagnosis not present

## 2024-07-30 DIAGNOSIS — I700 Atherosclerosis of aorta: Secondary | ICD-10-CM | POA: Diagnosis not present

## 2024-07-30 DIAGNOSIS — Z981 Arthrodesis status: Secondary | ICD-10-CM | POA: Diagnosis not present

## 2024-07-30 DIAGNOSIS — E785 Hyperlipidemia, unspecified: Secondary | ICD-10-CM | POA: Diagnosis not present

## 2024-07-30 DIAGNOSIS — M069 Rheumatoid arthritis, unspecified: Secondary | ICD-10-CM | POA: Diagnosis not present

## 2024-07-30 DIAGNOSIS — I471 Supraventricular tachycardia, unspecified: Secondary | ICD-10-CM | POA: Diagnosis present

## 2024-07-30 DIAGNOSIS — R109 Unspecified abdominal pain: Secondary | ICD-10-CM | POA: Diagnosis not present

## 2024-07-30 LAB — COMPREHENSIVE METABOLIC PANEL WITH GFR
ALT: 11 U/L (ref 0–44)
AST: 29 U/L (ref 15–41)
Albumin: 3.1 g/dL — ABNORMAL LOW (ref 3.5–5.0)
Alkaline Phosphatase: 51 U/L (ref 38–126)
Anion gap: 10 (ref 5–15)
BUN: 5 mg/dL — ABNORMAL LOW (ref 8–23)
CO2: 21 mmol/L — ABNORMAL LOW (ref 22–32)
Calcium: 8.4 mg/dL — ABNORMAL LOW (ref 8.9–10.3)
Chloride: 105 mmol/L (ref 98–111)
Creatinine, Ser: 0.68 mg/dL (ref 0.44–1.00)
GFR, Estimated: >60 mL/min (ref >60)
Glucose, Bld: 89 mg/dL (ref 70–99)
Potassium: 4.1 mmol/L (ref 3.5–5.1)
Sodium: 136 mmol/L (ref 135–145)
Total Bilirubin: 1.4 mg/dL — ABNORMAL HIGH (ref 0.0–1.2)
Total Protein: 6.9 g/dL (ref 6.5–8.1)

## 2024-07-30 LAB — CBC
HCT: 35.4 % — ABNORMAL LOW (ref 36.0–46.0)
Hemoglobin: 11.3 g/dL — ABNORMAL LOW (ref 12.0–15.0)
MCH: 28.2 pg (ref 26.0–34.0)
MCHC: 31.9 g/dL (ref 30.0–36.0)
MCV: 88.3 fL (ref 80.0–100.0)
Platelets: 257 K/uL (ref 150–400)
RBC: 4.01 MIL/uL (ref 3.87–5.11)
RDW: 16.8 % — ABNORMAL HIGH (ref 11.5–15.5)
WBC: 4 K/uL (ref 4.0–10.5)
nRBC: 0 % (ref 0.0–0.2)

## 2024-07-30 LAB — BASIC METABOLIC PANEL WITH GFR
Anion gap: 11 (ref 5–15)
BUN: 6 mg/dL — ABNORMAL LOW (ref 8–23)
CO2: 20 mmol/L — ABNORMAL LOW (ref 22–32)
Calcium: 8.3 mg/dL — ABNORMAL LOW (ref 8.9–10.3)
Chloride: 106 mmol/L (ref 98–111)
Creatinine, Ser: 0.65 mg/dL (ref 0.44–1.00)
GFR, Estimated: >60 mL/min (ref >60)
Glucose, Bld: 88 mg/dL (ref 70–99)
Potassium: 3.4 mmol/L — ABNORMAL LOW (ref 3.5–5.1)
Sodium: 137 mmol/L (ref 135–145)

## 2024-07-30 LAB — GLUCOSE, CAPILLARY
Glucose-Capillary: 87 mg/dL (ref 70–99)
Glucose-Capillary: 96 mg/dL (ref 70–99)

## 2024-07-30 LAB — MAGNESIUM: Magnesium: 1.8 mg/dL (ref 1.7–2.4)

## 2024-07-30 LAB — CBG MONITORING, ED
Glucose-Capillary: 93 mg/dL (ref 70–99)
Glucose-Capillary: 90 mg/dL (ref 70–99)
Glucose-Capillary: 88 mg/dL (ref 70–99)

## 2024-07-30 LAB — LIPASE, BLOOD: Lipase: 48 U/L (ref 11–51)

## 2024-07-30 LAB — PHOSPHORUS: Phosphorus: 3.8 mg/dL (ref 2.5–4.6)

## 2024-07-30 LAB — HEPARIN LEVEL (UNFRACTIONATED)
Heparin Unfractionated: <0.1 [IU]/mL — ABNORMAL LOW (ref 0.30–0.70)
Heparin Unfractionated: <0.1 [IU]/mL — ABNORMAL LOW (ref 0.30–0.70)
Heparin Unfractionated: 0.1 [IU]/mL — ABNORMAL LOW (ref 0.30–0.70)

## 2024-07-30 MED ORDER — INSULIN ASPART 100 UNIT/ML IJ SOLN: 0.0000 [IU] | Freq: Every day | INTRAMUSCULAR | Status: DC

## 2024-07-30 MED ORDER — HEPARIN BOLUS VIA INFUSION
1000.0000 [IU] | Freq: Once | INTRAVENOUS | Status: AC
  Administered 2024-07-30: 1000 [IU] via INTRAVENOUS
  Filled 2024-07-30: qty 1000

## 2024-07-30 MED ORDER — IOHEXOL 350 MG/ML SOLN
75.0000 mL | Freq: Once | INTRAVENOUS | Status: AC | PRN
  Administered 2024-07-30: 75 mL via INTRAVENOUS

## 2024-07-30 MED ORDER — LEVALBUTEROL HCL 1.25 MG/0.5ML IN NEBU
1.2500 mg | INHALATION_SOLUTION | Freq: Three times a day (TID) | RESPIRATORY_TRACT | Status: DC
  Administered 2024-07-30: 1.25 mg via RESPIRATORY_TRACT
  Filled 2024-07-30 (×3): qty 0.5

## 2024-07-30 MED ORDER — HYDRALAZINE HCL 20 MG/ML IJ SOLN: 10.0000 mg | INTRAMUSCULAR | Status: DC | PRN

## 2024-07-30 MED ORDER — HYDROXYCHLOROQUINE SULFATE 200 MG PO TABS
200.0000 mg | ORAL_TABLET | Freq: Every day | ORAL | Status: DC
  Administered 2024-07-30 – 2024-08-02 (×4): 200 mg via ORAL
  Filled 2024-07-30 (×5): qty 1

## 2024-07-30 MED ORDER — HEPARIN BOLUS VIA INFUSION
2000.0000 [IU] | Freq: Once | INTRAVENOUS | Status: AC
  Administered 2024-07-30: 2000 [IU] via INTRAVENOUS
  Filled 2024-07-30: qty 2000

## 2024-07-30 MED ORDER — ATORVASTATIN CALCIUM 10 MG PO TABS
5.0000 mg | ORAL_TABLET | Freq: Every day | ORAL | Status: DC
  Administered 2024-07-30 – 2024-08-02 (×4): 5 mg via ORAL
  Filled 2024-07-30 (×4): qty 1

## 2024-07-30 MED ORDER — POLYETHYLENE GLYCOL 3350 17 G PO PACK: 17.0000 g | PACK | Freq: Every day | ORAL | Status: DC | PRN

## 2024-07-30 MED ORDER — IPRATROPIUM-ALBUTEROL 0.5-2.5 (3) MG/3ML IN SOLN: 3.0000 mL | RESPIRATORY_TRACT | Status: DC | PRN

## 2024-07-30 MED ORDER — INSULIN ASPART 100 UNIT/ML IJ SOLN: 0.0000 [IU] | Freq: Three times a day (TID) | INTRAMUSCULAR | Status: DC

## 2024-07-30 MED ORDER — ACETAMINOPHEN 325 MG PO TABS
650.0000 mg | ORAL_TABLET | Freq: Four times a day (QID) | ORAL | Status: DC | PRN
  Administered 2024-07-30 – 2024-08-02 (×5): 650 mg via ORAL
  Filled 2024-07-30 (×5): qty 2

## 2024-07-30 MED ORDER — PANTOPRAZOLE SODIUM 40 MG PO TBEC
40.0000 mg | DELAYED_RELEASE_TABLET | Freq: Every day | ORAL | Status: DC
  Administered 2024-07-30 – 2024-08-02 (×4): 40 mg via ORAL
  Filled 2024-07-30: qty 1
  Filled 2024-07-30: qty 2
  Filled 2024-07-30 (×2): qty 1

## 2024-07-30 MED ORDER — POTASSIUM CHLORIDE 20 MEQ PO PACK
40.0000 meq | PACK | Freq: Once | ORAL | Status: AC
  Administered 2024-07-30: 40 meq via ORAL
  Filled 2024-07-30: qty 2

## 2024-07-30 MED ORDER — AMLODIPINE BESYLATE 5 MG PO TABS
5.0000 mg | ORAL_TABLET | Freq: Every day | ORAL | Status: DC
  Administered 2024-07-30 – 2024-08-02 (×4): 5 mg via ORAL
  Filled 2024-07-30 (×4): qty 1

## 2024-07-30 MED ORDER — PROMETHAZINE HCL 12.5 MG PO TABS: 12.5000 mg | ORAL_TABLET | Freq: Four times a day (QID) | ORAL | Status: DC | PRN

## 2024-07-30 MED ORDER — GLUCAGON HCL RDNA (DIAGNOSTIC) 1 MG IJ SOLR: 1.0000 mg | INTRAMUSCULAR | Status: DC | PRN

## 2024-07-30 MED ORDER — ONDANSETRON HCL 4 MG/2ML IJ SOLN
4.0000 mg | Freq: Four times a day (QID) | INTRAMUSCULAR | Status: DC | PRN
  Administered 2024-07-31: 4 mg via INTRAVENOUS
  Filled 2024-07-30: qty 2

## 2024-07-30 NOTE — ED Notes (Signed)
 Patient assisted to the bedside commode. Patient unsteady on her feet and instructed to not get up, out of bed without assistance. Patient verbalized understanding. Patient repositioned in bed and covered with warm blankets. Patient denies other needs at this time. Patient resting in bed with call light within reach.

## 2024-07-30 NOTE — Progress Notes (Signed)
 Dr.Amin notified of pt.having one toenail on each foot that is growing into skin. No bleeding/blood or scab noted. Pt.was unaware until notified at admission.  Pt.denies pain/discomfort.

## 2024-07-30 NOTE — Hospital Course (Addendum)
 Brief Narrative:  83 year old female with history of diabetes mellitus type 2, HTN, RA comes to the ED with complaints of bradycardia with heart rate in 30s and elevated blood pressure.  Patient was seen by cardiology team.  TSH is normal troponins are relatively flat.   Assessment & Plan:   Bradycardia, new diagnosis of atrial flutter History of SVT status post ablation 1980s -There is concerns of type I block.  Seen by cardiology team.  Optimize electrolytes, TSH is normal. - Echocardiogram - Currently on IV heparin - Cardiology/EP to see the patient today -Eventually will need DOAC   Abdominal pain, nonspecific -CT abdomen pelvis is negative.  Will check LFTs and lipase as well.  Hemoglobin is stable without any evidence of obvious bleeding.  Okay to continue heparin drip   Hypertensive urgency -Initial systolic blood pressures greater than 200.  Amlodipine  and hydralazine have been initiated.  IV as needed   Chest pressure  Poor historian, troponins are flat.  Echocardiogram ordered   Diabetes mellitus-A1c 6.1- 12/02/23. -Holding home med.  Sliding scale and Accu-Cheks   Rheumatoid arthritis -Pain control.  On Plaquenil  DVT prophylaxis: Heparin drip    Code Status: Full Code Family Communication: Spouse is present at bedside Continue hospital stay as patient will need evaluation by EP cardiology team   PT Follow up Recs:   Subjective: Seen at bedside denying any complaints.  Her heart rate is fluctuating from 40-50.  Blood pressure is overall stable. Still having some nonspecific abdominal discomfort.   Examination:  General exam: Appears calm and comfortable  Respiratory system: Clear to auscultation. Respiratory effort normal. Cardiovascular system: S1 & S2 heard, RRR. No JVD, murmurs, rubs, gallops or clicks. No pedal edema. Gastrointestinal system: Abdomen is nondistended, soft and nontender. No organomegaly or masses felt. Normal bowel sounds heard. Central  nervous system: Alert and oriented. No focal neurological deficits. Extremities: Symmetric 5 x 5 power. Skin: No rashes, lesions or ulcers Psychiatry: Judgement and insight appear normal. Mood & affect appropriate.

## 2024-07-30 NOTE — Progress Notes (Signed)
 Pt son would like for the Dr. To call him tomorrow to discuss the patients' medications. His number is [phone] and his name is Carlin.

## 2024-07-30 NOTE — Progress Notes (Signed)
 Brief Pharmacy Note  Informed by RN that heparin had been turned off for 2h while evaluating source of abdominal pain; CT negative for bleeding and heparin was resumed at 4a.  Heparin level (<0.1) was drawn after infusion had been restarted ~1h but is not representative of steady state.  Will continue with no changes for now and recheck level.  Marvetta Dauphin, PharmD, BCPS  07/30/2024 5:49 AM

## 2024-07-30 NOTE — Progress Notes (Signed)
 PHARMACY - ANTICOAGULATION CONSULT NOTE  Pharmacy Consult for heparin Indication: atrial fibrillation  Allergies  Allergen Reactions   Bee Venom Anaphylaxis   Dicyclomine      Blurry vision   Humira [Adalimumab]     LIPS SWELLED AND RASH   Metformin  And Related     rash   Patient Measurements: Height: 5' 7 (170.2 cm) Weight: 69.4 kg (153 lb) IBW/kg (Calculated) : 61.6 HEPARIN DW (KG): 69.4  Vital Signs: Temp: 99 F (37.2 C) (10/11 0600) Temp Source: Oral (10/11 0600) BP: 177/73 (10/11 1100) Pulse Rate: 68 (10/11 1100)  Labs: Recent Labs    07/29/24 1639 07/29/24 1822 07/30/24 0523 07/30/24 1103  HGB 11.9*  --  11.3*  --   HCT 37.4  --  35.4*  --   PLT 268  --  257  --   HEPARINUNFRC  --   --  <0.10* <0.10*  CREATININE 0.66  --  0.65 0.68  TROPONINIHS 16 22*  --   --    Estimated Creatinine Clearance: 52.7 mL/min (by C-G formula based on SCr of 0.68 mg/dL).  Medical History: Past Medical History:  Diagnosis Date   Allergic rhinitis    Cataract 2015   bilateral   COVID-19 10/2019   ASYMPTOMATIC   Diabetes mellitus without complication (HCC)    Diverticulosis    Esophageal reflux    Essential hypertension 01/07/2016   Fatty liver 2018   Fibula fracture 02/2005   Former smoker    Gastritis 2003   Heart murmur    MILD, NO CARDIOLOGIST   Hemorrhoids    History of cardiac dysrhythmia 2011   ABLATION  AT [facility]   History of kidney stones YRS AGO   Hyperglycemia    Hyperlipemia    Osteoarthritis    Personal history of colonic polyps 1998   hyperplastic   PMB (postmenopausal bleeding)    Pre-diabetes    BORDERLINE DIET CONTROLLED DOES NOT CHECK CBG   RA (rheumatoid arthritis) (HCC)    Rectal bleed 2018   TRANSFUSION GIVEN   Shingles 2012   Uterine fibroid     Medications:  Scheduled:   amLODipine   5 mg Oral Daily   atorvastatin   5 mg Oral Daily   hydroxychloroquine  200 mg Oral Daily   insulin  aspart  0-5 Units Subcutaneous QHS   insulin   aspart  0-9 Units Subcutaneous TID WC   levalbuterol  1.25 mg Nebulization Q8H   pantoprazole   40 mg Oral Daily   Assessment: Patient is an 83 YO F presenting to the ED for evaluation of bradycardia and hypertension. Pharmacy consulted for heparin dosing for atrial flutter. Patient without any known history of arrhythmias. Patient not on anticoagulation PTA. Hgb low-normal at 11.9 and plt WNL.   Heparin level undetectable, heparin has been running ok since it was restarted this AM  Goal of Therapy:  Heparin level 0.3-0.7 units/ml Monitor platelets by anticoagulation protocol: Yes   Plan:  Heparin 2000 units IV x 1, and gtt at 1150 units/hr F/u 8 hour heparin level F/u long term [facility] plan  Dorn Poot, PharmD, [facility] Clinical Pharmacist ED Pharmacist Phone # [phone] 07/30/2024 12:10 PM

## 2024-07-30 NOTE — Progress Notes (Signed)
 PHARMACY - ANTICOAGULATION CONSULT NOTE  Pharmacy Consult for heparin Indication: atrial fibrillation  Allergies  Allergen Reactions   Bee Venom Anaphylaxis   Dicyclomine      Blurry vision   Humira [Adalimumab]     LIPS SWELLED AND RASH   Metformin  And Related     rash   Patient Measurements: Height: 5' 7 (170.2 cm) Weight: 69.4 kg (153 lb) IBW/kg (Calculated) : 61.6 HEPARIN DW (KG): 69.4  Vital Signs: Temp: 98 F (36.7 C) (10/11 2048) Temp Source: Oral (10/11 2048) BP: 162/54 (10/11 1801) Pulse Rate: 61 (10/11 2048)  Labs: Recent Labs    07/29/24 1639 07/29/24 1822 07/30/24 0523 07/30/24 1103 07/30/24 2050  HGB 11.9*  --  11.3*  --   --   HCT 37.4  --  35.4*  --   --   PLT 268  --  257  --   --   HEPARINUNFRC  --   --  <0.10* <0.10* 0.10*  CREATININE 0.66  --  0.65 0.68  --   TROPONINIHS 16 22*  --   --   --    Estimated Creatinine Clearance: 52.7 mL/min (by C-G formula based on SCr of 0.68 mg/dL).  Medical History: Past Medical History:  Diagnosis Date   Allergic rhinitis    Cataract 2015   bilateral   COVID-19 10/2019   ASYMPTOMATIC   Diabetes mellitus without complication (HCC)    Diverticulosis    Esophageal reflux    Essential hypertension 01/07/2016   Fatty liver 2018   Fibula fracture 02/2005   Former smoker    Gastritis 2003   Heart murmur    MILD, NO CARDIOLOGIST   Hemorrhoids    History of cardiac dysrhythmia 2011   ABLATION  AT [facility]   History of kidney stones YRS AGO   Hyperglycemia    Hyperlipemia    Osteoarthritis    Personal history of colonic polyps 1998   hyperplastic   PMB (postmenopausal bleeding)    Pre-diabetes    BORDERLINE DIET CONTROLLED DOES NOT CHECK CBG   RA (rheumatoid arthritis) (HCC)    Rectal bleed 2018   TRANSFUSION GIVEN   Shingles 2012   Uterine fibroid     Medications:  Scheduled:   amLODipine   5 mg Oral Daily   atorvastatin   5 mg Oral Daily   hydroxychloroquine  200 mg Oral Daily   insulin   aspart  0-5 Units Subcutaneous QHS   insulin  aspart  0-9 Units Subcutaneous TID WC   pantoprazole   40 mg Oral Daily   Assessment: Patient is an 83 YO F presenting to the ED for evaluation of bradycardia and hypertension. Pharmacy consulted for heparin dosing for atrial flutter. Patient without any known history of arrhythmias. Patient not on anticoagulation PTA. Hgb low-normal at 11.9 and plt WNL.   Heparin level 0.1 on 1150 units/hr. Per RN, heparin moved to new IV site ~19:00 due to patient c/o pain, unsure if infiltration. No bleeding reported.  Goal of Therapy:  Heparin level 0.3-0.7 units/ml Monitor platelets by anticoagulation protocol: Yes   Plan:  Heparin 1000 units IV x 1, and increase gtt to 1300 units/hr F/u 8 hour heparin level F/u long term [facility] plan  Rocky Slade, PharmD, BCPS 07/30/2024 9:31 PM  Please check AMION for all [facility] Pharmacy phone numbers After 10:00 PM, call Main Pharmacy [phone]

## 2024-07-30 NOTE — ED Notes (Signed)
 Pt complains of abdominal pain, tender to palpation. Emokpae Ejiroghene MD notified by secure chat

## 2024-07-30 NOTE — Progress Notes (Signed)
 Cardiology Consultation   Patient ID: [name] MRN: [id number]; DOB: [birth year]  Admit date: 07/29/2024 Date of Consult: 07/30/2024  PCP:  Randeen Laine LABOR, MD   [name]:  None        Patient Profile: [name] is a 83 y.o. female with a hx of coronary calcifications, SVT post ablation, hypertension, hyperlipidemia, GERD, rheumatoid arthritis who is being seen 07/30/2024 for the evaluation of bradycardia at the request of [name].  History of Present Illness: [name] she has a history of SVT and ablation in 1980 at [facility].  She presented to the emergency room with bradycardia and hypertension.  She was working with home PT and was noted to have significantly elevated blood pressures.  On arrival to urgent care, she was found to be significantly bradycardic and hypertensive.  Heart rates were in the 30s.  She was therefore sent to the emergency room for further evaluation.  Per the patient, she has been feeling weak and fatigued over the last few weeks.  Her blood pressure at times has been difficult to control.  She does have a pressure sensation in her chest that is short-lived.  Upon arrival to the emergency room, she was hypertensive and bradycardic.  Blood pressure was 204/73 and heart rate was 40.  She feels well at rest with only some mild fatigue.   Past Medical History:  Diagnosis Date   Allergic rhinitis    Cataract 2015   bilateral   COVID-19 10/2019   ASYMPTOMATIC   Diabetes mellitus without complication (HCC)    Diverticulosis    Esophageal reflux    Essential hypertension 01/07/2016   Fatty liver 2018   Fibula fracture 02/2005   Former smoker    Gastritis 2003   Heart murmur    MILD, NO [name]   Hemorrhoids    History of cardiac dysrhythmia 2011   ABLATION  AT [facility]   History of kidney stones YRS AGO   Hyperglycemia    Hyperlipemia    Osteoarthritis    Personal history of colonic polyps  1998   hyperplastic   PMB (postmenopausal bleeding)    Pre-diabetes    BORDERLINE DIET CONTROLLED DOES NOT CHECK CBG   RA (rheumatoid arthritis) (HCC)    Rectal bleed 2018   TRANSFUSION GIVEN   Shingles 2012   Uterine fibroid     Past Surgical History:  Procedure Laterality Date   CHOLECYSTECTOMY  YRS AGO   COLONOSCOPY  11/08   internal hemorrhoids   DILATATION & CURETTAGE/HYSTEROSCOPY WITH MYOSURE N/A 07/06/2020   Procedure: DILATATION & CURETTAGE/HYSTEROSCOPY;  Surgeon: Arnaldo Purchase, MD;  Location: [facility];  Service: Gynecology;  Laterality: N/A;  request 7:30am OR time in [place] Gyn block requests 30 minutes   EP procedure  1994   SVT; normal echo   ESOPHAGOGASTRODUODENOSCOPY  11/03   reactive gastropathy   EYE SURGERY Bilateral 2017   CATARACTS   LIPOMA EXCISION  11/2002   SVT s/p surgery--? ablation  2011   AT [facility]   TUBAL LIGATION  YRS AGO   vaginal polyp excised  12/2000   benign     Home Medications:  Prior to Admission medications   Medication Sig Start Date End Date Taking? Authorizing Provider  acetaminophen  (TYLENOL ) 325 MG tablet Take 650 mg by mouth every 6 (six) hours as needed.   Yes [provider]  amLODipine  (NORVASC ) 5 MG tablet TAKE 1 TABLET (5 MG TOTAL) BY  MOUTH DAILY. 06/16/24  Yes Tower, Laine LABOR, MD  atorvastatin  (LIPITOR) 10 MG tablet TAKE 0.5 TABLETS (5 MG TOTAL) BY MOUTH EVERY OTHER DAY. 06/03/24  Yes Tower, Marne A, MD  fluticasone  (FLONASE ) 50 MCG/ACT nasal spray PLACE 2 SPRAYS INTO THE NOSE DAILY. Patient taking differently: Place 2 sprays into both nostrils daily as needed for allergies or rhinitis. 01/06/23  Yes Tower, Laine LABOR, MD  folic acid  (FOLVITE ) 1 MG tablet Take 1 mg by mouth daily.   Yes [provider]  glipiZIDE  (GLUCOTROL  XL) 2.5 MG 24 hr tablet Take 1 tablet (2.5 mg total) by mouth daily with breakfast. 09/04/23  Yes Tower, Laine LABOR, MD  hydroxychloroquine (PLAQUENIL) 200 MG tablet Take  200 mg by mouth daily. 12/30/22  Yes [provider]  loratadine  (CLARITIN ) 10 MG tablet Take 10 mg by mouth daily as needed for allergies.   Yes [provider]  methotrexate (RHEUMATREX) 2.5 MG tablet Take 17.5 mg by mouth once a week. Caution:Chemotherapy. Protect from light. ( Tuesday of each week )   Yes [provider]  pantoprazole  (PROTONIX ) 40 MG tablet TAKE 1 TABLET BY MOUTH EVERY DAY 05/25/24  Yes Tower, Laine LABOR, MD  polyethylene glycol (MIRALAX  / GLYCOLAX ) packet Take 17 g by mouth daily.   Yes [provider]  triamcinolone  cream (KENALOG ) 0.1 % Apply 1 Application topically 2 (two) times daily as needed. On rash/affected area 01/15/24  Yes Tower, Laine LABOR, MD  ACCU-CHEK GUIDE TEST test strip USE TO CHECK BLOOD SUGAR DAILY 02/01/24   Tower, Laine LABOR, MD  Accu-Chek Softclix Lancets lancets Check glucose level daily and as needed for diabetes type 2  (lancets for the soft clicks lancing device AccuChek meter) 09/04/23   Tower, Laine LABOR, MD  albuterol  (PROVENTIL  HFA;VENTOLIN  HFA) 108 (90 Base) MCG/ACT inhaler Inhale 2 puffs into the lungs every 4 (four) hours as needed for wheezing or shortness of breath. Please give spacer if possible Patient not taking: Reported on 07/29/2024 10/29/18   Randeen Laine A, MD  blood glucose meter kit and supplies To check glucose daily and prn for DM2 E11.9 04/01/22   Tower, Laine LABOR, MD  Blood Pressure Monitoring (ADULT BLOOD PRESSURE CUFF LG) KIT Blood pressure cuff size large for the arm to check blood pressure daily and as needed for HTN Patient not taking: Reported on 07/22/2024 06/03/24   Tower, Laine LABOR, MD  chlorthalidone  (HYGROTON ) 25 MG tablet Take 1 tablet (25 mg total) by mouth daily. Patient not taking: Reported on 07/29/2024 09/04/23   Tower, Laine LABOR, MD  EPINEPHrine (EPIPEN IJ) Inject as directed as needed. Reported on 12/05/2015 Patient not taking: Reported on 07/29/2024    [provider]  potassium chloride   (KLOR-CON  M) 10 MEQ tablet TAKE 1 TABLET BY MOUTH TWICE A DAY Patient not taking: Reported on 07/29/2024 06/14/24   Randeen Laine LABOR, MD    Scheduled Meds:  amLODipine   5 mg Oral Daily   atorvastatin   5 mg Oral Daily   hydroxychloroquine  200 mg Oral Daily   insulin  aspart  0-5 Units Subcutaneous QHS   insulin  aspart  0-9 Units Subcutaneous TID WC   levalbuterol  1.25 mg Nebulization Q8H   pantoprazole   40 mg Oral Daily   potassium chloride   40 mEq Oral Once   Continuous Infusions:  heparin 1,000 Units/hr (07/30/24 0354)   PRN Meds: acetaminophen , glucagon (human recombinant), hydrALAZINE, ipratropium-albuterol , ondansetron  (ZOFRAN ) IV, polyethylene glycol, promethazine  Allergies:    Allergies  Allergen Reactions   Bee Venom Anaphylaxis   Dicyclomine      Blurry vision   Humira [Adalimumab]     LIPS SWELLED AND RASH   Metformin  And Related     rash    Social History:   Social History   Socioeconomic History   Marital status: Married    Spouse name: Not on file   Number of children: 3   Years of education: Not on file   Highest education level: Not on file  Occupational History   Occupation: retired    Associate Professor: RETIRED  Tobacco Use   Smoking status: Former    Current packs/day: 0.00    Average packs/day: 0.8 packs/day for 15.0 years (11.3 ttl pk-yrs)    Types: Cigarettes    Start date: 10/20/1988    Quit date: 10/21/2003    Years since quitting: 20.7   Smokeless tobacco: Never  Vaping Use   Vaping status: Never Used  Substance and Sexual Activity   Alcohol use: No    Alcohol/week: 0.0 standard drinks of alcohol   Drug use: No   Sexual activity: Yes    Comment: 1st intercourse 83 yo-Fewer than 5 partners  Other Topics Concern   Not on file  Social History Narrative   Married      Retired      Occasional caffeine      No regular exercise   Social Drivers of Corporate investment banker Strain: Low Risk  (11/17/2023)   Overall Financial Resource Strain  (CARDIA)    Difficulty of Paying Living Expenses: Not hard at all  Food Insecurity: No Food Insecurity (06/24/2024)   Hunger Vital Sign    Worried About Running Out of Food in the Last Year: Never true    Ran Out of Food in the Last Year: Never true  Transportation Needs: No Transportation Needs (06/24/2024)   PRAPARE - Administrator, Civil Service (Medical): No    Lack of Transportation (Non-Medical): No  Physical Activity: Inactive (11/17/2023)   Exercise Vital Sign    Days of Exercise per Week: 0 days    Minutes of Exercise per Session: 0 min  Stress: No Stress Concern Present (11/17/2023)   [employer] of Occupational Health - Occupational Stress Questionnaire    Feeling of Stress : Not at all  Social Connections: Moderately Isolated (11/17/2023)   Social Connection and Isolation Panel    Frequency of Communication with Friends and Family: More than three times a week    Frequency of Social Gatherings with Friends and Family: More than three times a week    Attends Religious Services: Never    Database administrator or Organizations: No    Attends Banker Meetings: Never    Marital Status: Married  Catering manager Violence: Not At Risk (06/24/2024)   Humiliation, Afraid, Rape, and Kick questionnaire    Fear of Current or Ex-Partner: No    Emotionally Abused: No    Physically Abused: No    Sexually Abused: No    Family History:    Family History  Problem Relation Age of Onset   Colon cancer Father 7   Diabetes Mother    Heart disease Mother    Kidney disease Mother        ESRD   Stroke Mother    Hypertension Brother    Breast cancer Sister 30   Diabetes Other        nephews   Colon cancer  Sister    Esophageal cancer Neg Hx    Stomach cancer Neg Hx    Rectal cancer Neg Hx      ROS:  Please see the history of present illness.   All other ROS reviewed and negative.     Physical Exam/Data: Vitals:   07/30/24 0530 07/30/24 0545 07/30/24  0600 07/30/24 0615  BP: (!) 169/54 (!) 158/57 (!) 168/76 (!) 168/70  Pulse: (!) 120 (!) 29 (!) 39 (!) 38  Resp: 17 (!) 8 17 19   Temp:   99 F (37.2 C)   TempSrc:   Oral   SpO2: 96% 97% 98% 97%  Weight:      Height:       No intake or output data in the 24 hours ending 07/30/24 1102    07/29/2024    4:18 PM 07/22/2024   11:12 AM 06/03/2024    1:55 PM  Last 3 Weights  Weight (lbs) 153 lb 153 lb --  Weight (kg) 69.4 kg 69.4 kg --     Body mass index is 23.96 kg/m.  General:  Well nourished, well developed, in no acute distress HEENT: normal Neck: no JVD Vascular: No carotid bruits; Distal pulses 2+ bilaterally Cardiac: Bradycardic, regular Lungs:  clear to auscultation bilaterally, no wheezing, rhonchi or rales  Abd: soft, nontender, no hepatomegaly  Ext: no edema Musculoskeletal:  No deformities, BUE and BLE strength normal and equal Skin: warm and dry  Neuro:  CNs 2-12 intact, no focal abnormalities noted Psych:  Normal affect   EKG:  The EKG was personally reviewed and demonstrates: Sinus rhythm, Mobitz 1 AV block Telemetry:  Telemetry was personally reviewed and demonstrates: Sinus rhythm, Mobitz 1 AV block  Relevant CV Studies: Echo pending  Laboratory Data: High Sensitivity Troponin:   Recent Labs  Lab 07/29/24 1639 07/29/24 1822  TROPONINIHS 16 22*     Chemistry Recent Labs  Lab 07/29/24 1639 07/29/24 1822 07/30/24 0523  NA 141  --  137  K 3.4*  --  3.4*  CL 106  --  106  CO2 22  --  20*  GLUCOSE 86  --  88  BUN <5*  --  6*  CREATININE 0.66  --  0.65  CALCIUM  8.7*  --  8.3*  MG  --  1.7  --   GFRNONAA >60  --  >60  ANIONGAP 13  --  11    No results for input(s): PROT, ALBUMIN, AST, ALT, ALKPHOS, BILITOT in the last 168 hours. Lipids No results for input(s): CHOL, TRIG, HDL, LABVLDL, LDLCALC, CHOLHDL in the last 168 hours.  Hematology Recent Labs  Lab 07/29/24 1639 07/30/24 0523  WBC 4.2 4.0  RBC 4.21 4.01  HGB  11.9* 11.3*  HCT 37.4 35.4*  MCV 88.8 88.3  MCH 28.3 28.2  MCHC 31.8 31.9  RDW 17.0* 16.8*  PLT 268 257   Thyroid   Recent Labs  Lab 07/29/24 1640  TSH 1.572    BNPNo results for input(s): BNP, PROBNP in the last 168 hours.  DDimer No results for input(s): DDIMER in the last 168 hours.  Radiology/Studies:  CT ABDOMEN PELVIS W CONTRAST Result Date: 07/30/2024 CLINICAL DATA:  Worsening abdominal pain EXAM: CT ABDOMEN AND PELVIS WITH CONTRAST TECHNIQUE: Multidetector CT imaging of the abdomen and pelvis was performed using the standard protocol following bolus administration of intravenous contrast. RADIATION DOSE REDUCTION: This exam was performed according to the departmental dose-optimization program which includes automated exposure control, adjustment of the  mA and/or kV according to patient size and/or use of iterative reconstruction technique. CONTRAST:  75mL OMNIPAQUE  IOHEXOL  350 MG/ML SOLN COMPARISON:  02/15/2021 FINDINGS: Lower chest: Small right-sided pleural effusion is noted. No focal infiltrate is seen. Hepatobiliary: No focal liver abnormality is seen. Status post cholecystectomy. No biliary dilatation. Pancreas: Unremarkable. No pancreatic ductal dilatation or surrounding inflammatory changes. Spleen: Normal in size without focal abnormality. Adrenals/Urinary Tract: Adrenal glands are within normal limits. Kidneys demonstrate a normal enhancement pattern bilaterally. No renal calculi or obstructive changes are seen. The bladder is well distended. Stomach/Bowel: No obstructive or inflammatory changes of the colon are seen. Mild diverticular changes noted. The appendix is within normal limits. Small bowel and stomach are unremarkable. Vascular/Lymphatic: Aortic atherosclerosis. No enlarged abdominal or pelvic lymph nodes. Reproductive: Uterus demonstrates calcified fibroids stable from the prior exam. No adnexal mass is seen. Other: None Musculoskeletal: Degenerative changes of  lumbar spine are noted. 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 Tiny right pleural effusion. Electronically Signed   By: Oneil Devonshire M.D.   On: 07/30/2024 02:48   CT Head Wo Contrast Result Date: 07/29/2024 EXAM: CT HEAD WITHOUT CONTRAST 07/29/2024 05:47:57 PM TECHNIQUE: CT of the head was performed without the administration of intravenous contrast. Automated exposure control, iterative reconstruction, and/or weight based adjustment of the mA/kV was utilized to reduce the radiation dose to as low as reasonably achievable. COMPARISON: None available. CLINICAL HISTORY: Headache, new onset (Age >= 51y); HA HTN. FINDINGS: BRAIN AND VENTRICLES: Atherosclerotic calcifications are present in the cavernous carotid arteries bilaterally. No hyperdense vessel is present. No acute hemorrhage. No evidence of acute infarct. No hydrocephalus. No extra-axial collection. No mass effect or midline shift. ORBITS: Bilateral lens replacements are noted. The globes and orbits are otherwise within normal limits. SINUSES: Wall thickening is present in the left maxillary sinus suggesting history of chronic disease. No active sinus disease is present. SOFT TISSUES AND SKULL: No acute soft tissue abnormality. No skull fracture. IMPRESSION: 1. No acute intracranial abnormality. 2. Atherosclerotic calcifications in the cavernous carotid arteries bilaterally. No hyperdense vessel. Electronically signed by: Lonni Necessary MD 07/29/2024 05:59 PM EDT RP Workstation: [id number]   DG Chest Portable 1 View Result Date: 07/29/2024 CLINICAL DATA:  Bradycardia and hypertension EXAM: PORTABLE CHEST 1 VIEW COMPARISON:  Chest radiograph dated 08/26/2023 FINDINGS: Normal lung volumes. No focal consolidations. No pleural effusion or pneumothorax. Enlarged cardiomediastinal silhouette is likely projectional. No acute osseous abnormality. IMPRESSION: No active disease. Electronically  Signed   By: Limin  Xu M.D.   On: 07/29/2024 17:18     Assessment and Plan: Second-degree Mobitz 1 AV block: Patient has significant fatigue and weakness.  She is on no rate controlling medications.  As she is continue to have heart block, Keyarra Rendall likely need pacemaker implantation.  Faithe Ariola continue to monitor through the weekend and likely plan for pacemaker implant tomorrow.   Atrial flutter: Noted by general cardiology.  Tanner Yeley further monitor via pacemaker. Chest pressure: Echo pending.  Unlikely cardiac at this time.  Hypertensive urgency: Continue current medications.  May improve post pacemaker with improvement in heart rates. SVT: Ablation in the 1980s    For questions or updates, please contact [facility] Please consult [email or web address] for contact info under      Signed, Jessalynn Mccowan Gladis Norton, MD  07/30/2024 11:02 AM

## 2024-07-30 NOTE — Progress Notes (Signed)
 PROGRESS NOTE    [name]  FMW:[id number] DOB: 02-08-1941 DOA: 07/29/2024 PCP: Randeen Laine LABOR, MD    Brief Narrative:  83 year old female with history of diabetes mellitus type 2, HTN, RA comes to the ED with complaints of bradycardia with heart rate in 30s and elevated blood pressure.  Patient was seen by cardiology team.  TSH is normal troponins are relatively flat.   Assessment & Plan:   Bradycardia, new diagnosis of atrial flutter History of SVT status post ablation 1980s -There is concerns of type I block.  Seen by cardiology team.  Optimize electrolytes, TSH is normal. - Echocardiogram - Currently on IV heparin - Cardiology/EP to see the patient today -Eventually will need DOAC   Abdominal pain, nonspecific -CT abdomen pelvis is negative.  Will check LFTs and lipase as well.  Hemoglobin is stable without any evidence of obvious bleeding.  Okay to continue heparin drip   Hypertensive urgency -Initial systolic blood pressures greater than 200.  Amlodipine  and hydralazine have been initiated.  IV as needed   Chest pressure  Poor historian, troponins are flat.  Echocardiogram ordered   Diabetes mellitus-A1c 6.1- 12/02/23. -Holding home med.  Sliding scale and Accu-Cheks   Rheumatoid arthritis -Pain control.  On Plaquenil  DVT prophylaxis: Heparin drip    Code Status: Full Code Family Communication: Spouse is present at bedside Continue hospital stay as patient will need evaluation by EP cardiology team   PT Follow up Recs:   Subjective: Seen at bedside denying any complaints.  Her heart rate is fluctuating from 40-50.  Blood pressure is overall stable. Still having some nonspecific abdominal discomfort.   Examination:  General exam: Appears calm and comfortable  Respiratory system: Clear to auscultation. Respiratory effort normal. Cardiovascular system: S1 & S2 heard, RRR. No JVD, murmurs, rubs, gallops or clicks. No pedal edema. Gastrointestinal  system: Abdomen is nondistended, soft and nontender. No organomegaly or masses felt. Normal bowel sounds heard. Central nervous system: Alert and oriented. No focal neurological deficits. Extremities: Symmetric 5 x 5 power. Skin: No rashes, lesions or ulcers Psychiatry: Judgement and insight appear normal. Mood & affect appropriate.                Diet Orders (From admission, onward)     Start     Ordered   07/29/24 2154  Diet heart healthy/carb modified Room service appropriate? Yes; Fluid consistency: Thin  Diet effective now       Question Answer Comment  Diet-HS Snack? Nothing   Room service appropriate? Yes   Fluid consistency: Thin      07/29/24 2154            Objective: Vitals:   07/30/24 0530 07/30/24 0545 07/30/24 0600 07/30/24 0615  BP: (!) 169/54 (!) 158/57 (!) 168/76 (!) 168/70  Pulse: (!) 120 (!) 29 (!) 39 (!) 38  Resp: 17 (!) 8 17 19   Temp:   99 F (37.2 C)   TempSrc:   Oral   SpO2: 96% 97% 98% 97%  Weight:      Height:       No intake or output data in the 24 hours ending 07/30/24 1056 Filed Weights   07/29/24 1618  Weight: 69.4 kg    Scheduled Meds:  amLODipine   5 mg Oral Daily   atorvastatin   5 mg Oral Daily   hydroxychloroquine  200 mg Oral Daily   insulin  aspart  0-5 Units Subcutaneous QHS   insulin  aspart  0-9 Units  Subcutaneous TID WC   levalbuterol  1.25 mg Nebulization Q8H   pantoprazole   40 mg Oral Daily   potassium chloride   40 mEq Oral Once   Continuous Infusions:  heparin 1,000 Units/hr (07/30/24 0354)    Nutritional status     Body mass index is 23.96 kg/m.  Data Reviewed:   CBC: Recent Labs  Lab 07/29/24 1639 07/30/24 0523  WBC 4.2 4.0  HGB 11.9* 11.3*  HCT 37.4 35.4*  MCV 88.8 88.3  PLT 268 257   Basic Metabolic Panel: Recent Labs  Lab 07/29/24 1639 07/29/24 1822 07/30/24 0523  NA 141  --  137  K 3.4*  --  3.4*  CL 106  --  106  CO2 22  --  20*  GLUCOSE 86  --  88  BUN <5*  --  6*   CREATININE 0.66  --  0.65  CALCIUM  8.7*  --  8.3*  MG  --  1.7  --    GFR: Estimated Creatinine Clearance: 52.7 mL/min (by C-G formula based on SCr of 0.65 mg/dL). Liver Function Tests: No results for input(s): AST, ALT, ALKPHOS, BILITOT, PROT, ALBUMIN in the last 168 hours. No results for input(s): LIPASE, AMYLASE in the last 168 hours. No results for input(s): AMMONIA in the last 168 hours. Coagulation Profile: No results for input(s): INR, PROTIME in the last 168 hours. Cardiac Enzymes: No results for input(s): CKTOTAL, CKMB, CKMBINDEX, TROPONINI in the last 168 hours. BNP (last 3 results) No results for input(s): PROBNP in the last 8760 hours. HbA1C: No results for input(s): HGBA1C in the last 72 hours. CBG: Recent Labs  Lab 07/30/24 0118 07/30/24 0740  GLUCAP 93 90   Lipid Profile: No results for input(s): CHOL, HDL, LDLCALC, TRIG, CHOLHDL, LDLDIRECT in the last 72 hours. Thyroid  Function Tests: Recent Labs    07/29/24 1640  TSH 1.572   Anemia Panel: No results for input(s): VITAMINB12, FOLATE, FERRITIN, TIBC, IRON, RETICCTPCT in the last 72 hours. Sepsis Labs: No results for input(s): PROCALCITON, LATICACIDVEN in the last 168 hours.  No results found for this or any previous visit (from the past 240 hours).       Radiology Studies: CT ABDOMEN PELVIS W CONTRAST Result Date: 07/30/2024 CLINICAL DATA:  Worsening abdominal pain EXAM: CT ABDOMEN AND PELVIS WITH CONTRAST TECHNIQUE: Multidetector CT imaging of the abdomen and pelvis was performed using the standard protocol following bolus administration of intravenous contrast. RADIATION DOSE REDUCTION: This exam was performed according to the departmental dose-optimization program which includes automated exposure control, adjustment of the mA and/or kV according to patient size and/or use of iterative reconstruction technique. CONTRAST:  75mL OMNIPAQUE   IOHEXOL  350 MG/ML SOLN COMPARISON:  02/15/2021 FINDINGS: Lower chest: Small right-sided pleural effusion is noted. No focal infiltrate is seen. Hepatobiliary: No focal liver abnormality is seen. Status post cholecystectomy. No biliary dilatation. Pancreas: Unremarkable. No pancreatic ductal dilatation or surrounding inflammatory changes. Spleen: Normal in size without focal abnormality. Adrenals/Urinary Tract: Adrenal glands are within normal limits. Kidneys demonstrate a normal enhancement pattern bilaterally. No renal calculi or obstructive changes are seen. The bladder is well distended. Stomach/Bowel: No obstructive or inflammatory changes of the colon are seen. Mild diverticular changes noted. The appendix is within normal limits. Small bowel and stomach are unremarkable. Vascular/Lymphatic: Aortic atherosclerosis. No enlarged abdominal or pelvic lymph nodes. Reproductive: Uterus demonstrates calcified fibroids stable from the prior exam. No adnexal mass is seen. Other: None Musculoskeletal: Degenerative changes of lumbar spine are noted. IMPRESSION:  Diverticular change without diverticulitis. 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 Tiny right pleural effusion. Electronically Signed   By: Oneil Devonshire M.D.   On: 07/30/2024 02:48   CT Head Wo Contrast Result Date: 07/29/2024 EXAM: CT HEAD WITHOUT CONTRAST 07/29/2024 05:47:57 PM TECHNIQUE: CT of the head was performed without the administration of intravenous contrast. Automated exposure control, iterative reconstruction, and/or weight based adjustment of the mA/kV was utilized to reduce the radiation dose to as low as reasonably achievable. COMPARISON: None available. CLINICAL HISTORY: Headache, new onset (Age >= 51y); HA HTN. FINDINGS: BRAIN AND VENTRICLES: Atherosclerotic calcifications are present in the cavernous carotid arteries bilaterally. No hyperdense vessel is present. No acute hemorrhage. No evidence of acute infarct. No hydrocephalus. No  extra-axial collection. No mass effect or midline shift. ORBITS: Bilateral lens replacements are noted. The globes and orbits are otherwise within normal limits. SINUSES: Wall thickening is present in the left maxillary sinus suggesting history of chronic disease. No active sinus disease is present. SOFT TISSUES AND SKULL: No acute soft tissue abnormality. No skull fracture. IMPRESSION: 1. No acute intracranial abnormality. 2. Atherosclerotic calcifications in the cavernous carotid arteries bilaterally. No hyperdense vessel. Electronically signed by: Lonni Necessary MD 07/29/2024 05:59 PM EDT RP Workstation: [id number]   DG Chest Portable 1 View Result Date: 07/29/2024 CLINICAL DATA:  Bradycardia and hypertension EXAM: PORTABLE CHEST 1 VIEW COMPARISON:  Chest radiograph dated 08/26/2023 FINDINGS: Normal lung volumes. No focal consolidations. No pleural effusion or pneumothorax. Enlarged cardiomediastinal silhouette is likely projectional. No acute osseous abnormality. IMPRESSION: No active disease. Electronically Signed   By: Limin  Xu M.D.   On: 07/29/2024 17:18           LOS: 0 days   Time spent= 35 mins    Burgess JAYSON Dare, MD [facility]  If 7PM-7AM, please contact night-coverage  07/30/2024, 10:56 AM

## 2024-07-31 ENCOUNTER — Inpatient Hospital Stay (HOSPITAL_COMMUNITY)

## 2024-07-31 DIAGNOSIS — I441 Atrioventricular block, second degree: Secondary | ICD-10-CM | POA: Diagnosis not present

## 2024-07-31 DIAGNOSIS — I4892 Unspecified atrial flutter: Secondary | ICD-10-CM | POA: Diagnosis not present

## 2024-07-31 DIAGNOSIS — R001 Bradycardia, unspecified: Secondary | ICD-10-CM | POA: Diagnosis not present

## 2024-07-31 LAB — CBC
HCT: 37 % (ref 36.0–46.0)
Hemoglobin: 12 g/dL (ref 12.0–15.0)
MCH: 28.3 pg (ref 26.0–34.0)
MCHC: 32.4 g/dL (ref 30.0–36.0)
MCV: 87.3 fL (ref 80.0–100.0)
Platelets: 278 K/uL (ref 150–400)
RBC: 4.24 MIL/uL (ref 3.87–5.11)
RDW: 16.5 % — ABNORMAL HIGH (ref 11.5–15.5)
WBC: 3.7 K/uL — ABNORMAL LOW (ref 4.0–10.5)
nRBC: 0 % (ref 0.0–0.2)

## 2024-07-31 LAB — BASIC METABOLIC PANEL WITH GFR
Anion gap: 17 — ABNORMAL HIGH (ref 5–15)
BUN: 5 mg/dL — ABNORMAL LOW (ref 8–23)
CO2: 21 mmol/L — ABNORMAL LOW (ref 22–32)
Calcium: 8.6 mg/dL — ABNORMAL LOW (ref 8.9–10.3)
Chloride: 105 mmol/L (ref 98–111)
Creatinine, Ser: 0.71 mg/dL (ref 0.44–1.00)
GFR, Estimated: >60 mL/min (ref >60)
Glucose, Bld: 88 mg/dL (ref 70–99)
Potassium: 3.6 mmol/L (ref 3.5–5.1)
Sodium: 143 mmol/L (ref 135–145)

## 2024-07-31 LAB — GLUCOSE, CAPILLARY
Glucose-Capillary: 79 mg/dL (ref 70–99)
Glucose-Capillary: 88 mg/dL (ref 70–99)
Glucose-Capillary: 105 mg/dL — ABNORMAL HIGH (ref 70–99)
Glucose-Capillary: 93 mg/dL (ref 70–99)

## 2024-07-31 LAB — ECHOCARDIOGRAM COMPLETE
AR max vel: 2.18 cm2
AV Area VTI: 2.12 cm2
AV Area mean vel: 2.18 cm2
AV Mean grad: 5 mmHg
AV Peak grad: 8.5 mmHg
Ao pk vel: 1.46 m/s
Area-P 1/2: 3.27 cm2
Height: 67 in
S' Lateral: 1.7 cm
Weight: 2447.99 [oz_av]

## 2024-07-31 LAB — MAGNESIUM: Magnesium: 1.6 mg/dL — ABNORMAL LOW (ref 1.7–2.4)

## 2024-07-31 LAB — HEPARIN LEVEL (UNFRACTIONATED): Heparin Unfractionated: 0.17 [IU]/mL — ABNORMAL LOW (ref 0.30–0.70)

## 2024-07-31 MED ORDER — INFLUENZA VAC SPLIT HIGH-DOSE 0.5 ML IM SUSY
0.5000 mL | PREFILLED_SYRINGE | INTRAMUSCULAR | Status: DC
  Filled 2024-07-31: qty 0.5

## 2024-07-31 NOTE — Progress Notes (Signed)
  Progress Note  Patient Name: [name] Date of Encounter: 07/31/2024 [facility] Cardiologist: None   Interval Summary   Feeling well without acute complaint  Vital Signs Vitals:   07/30/24 2048 07/31/24 0012 07/31/24 0421 07/31/24 0808  BP:  (!) 144/83 (!) 162/79 (!) 156/43  Pulse: 61 (!) 48 (!) 49 (!) 52  Resp: 15 16 15 18   Temp: 98 F (36.7 C) 97.7 F (36.5 C) 98.5 F (36.9 C) 97.9 F (36.6 C)  TempSrc: Oral Oral Oral Oral  SpO2: 99% 96% 98% 97%  Weight:      Height:        Intake/Output Summary (Last 24 hours) at 07/31/2024 0830 Last data filed at 07/31/2024 0354 Gross per 24 hour  Intake 733.17 ml  Output --  Net 733.17 ml      07/29/2024    4:18 PM 07/22/2024   11:12 AM 06/03/2024    1:55 PM  Last 3 Weights  Weight (lbs) 153 lb 153 lb --  Weight (kg) 69.4 kg 69.4 kg --      Telemetry/ECG  Sinus rhythm, Mobitz 1 AV block- Personally Reviewed  Physical Exam  GEN: No acute distress.   Neck: No JVD Cardiac: RRR, no murmurs, rubs, or gallops.  Respiratory: Clear to auscultation bilaterally. GI: Soft, nontender, non-distended  MS: No edema  Assessment & Plan  1.  Second-degree Mobitz 1 AV block: Patient has continued symptoms of weakness and tach.  Again she feels well at rest.  Based on her recurrent symptoms, pacemaker implant would be indicated.  Risks and benefits have been discussed.  Jorje Vanatta plan for pacemaker implant tomorrow.  2.  Atrial flutter: Noted by general cardiology.  She has not had atrial flutter on telemetry or EKGs that I can see since she has been here.  Waynette Towers hold heparin.  Bonita Brindisi restart anticoagulation if she has atrial flutter noted on pacemaker interrogation.  3.  Chest pressure: Echo pending.  If no wall motion abnormalities, no further workup.  4.  SVT: Post ablation in the 1980s   For questions or updates, please contact [facility] Please consult [email or web address] for contact info under          Signed, Jamont Mellin Gladis Norton, MD

## 2024-07-31 NOTE — Progress Notes (Signed)
 PROGRESS NOTE    [name]  FMW:[id number] DOB: [birth year] DOA: 07/29/2024 PCP: Randeen Laine LABOR, MD    Brief Narrative:  82 year old female with history of diabetes mellitus type 2, HTN, RA comes to the ED with complaints of bradycardia with heart rate in 30s and elevated blood pressure.  Patient was seen by cardiology team.  TSH is normal troponins are relatively flat.   Assessment & Plan:   Bradycardia, new diagnosis of SVT? Resolved.  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  Seen by cardiology team.  Optimize electrolytes, TSH is normal.  Seen by EP, timing of pacemaker per their service. - Echocardiogram-pending - dc hep for now. No flutter on tele per EP - Cardiology/EP to see the patient today   Abdominal pain, nonspecific -CT abdomen pelvis is negative.  LFTs and lipase are normal   Hypertensive urgency -Initial systolic blood pressures greater than 200.  Amlodipine  and hydralazine have been initiated.  IV as needed   Chest pressure  Poor historian, troponins are flat.  Echocardiogram ordered   Diabetes mellitus-A1c 6.1- 12/02/23. -Holding home med.  Sliding scale and Accu-Cheks   Rheumatoid arthritis -Pain control.  On Plaquenil  DVT prophylaxis: Heparin drip    Code Status: Full Code Family Communication: Spouse is present at bedside Continue hospital stay as patient will need evaluation by EP cardiology team   PT Follow up Recs:   Subjective: Overnight heart rate down in low 30s while sleeping.  Appeared to be asymptomatic During my visit, no complaints.   Examination:  General exam: Appears calm and comfortable  Respiratory system: Clear to auscultation. Respiratory effort normal. Cardiovascular system: S1 & S2 heard, RRR. No JVD, murmurs, rubs, gallops or clicks. No pedal edema. Gastrointestinal system: Abdomen is nondistended, soft and nontender. No organomegaly or masses felt. Normal bowel sounds  heard. Central nervous system: Alert and oriented. No focal neurological deficits. Extremities: Symmetric 5 x 5 power. Skin: No rashes, lesions or ulcers Psychiatry: Judgement and insight appear normal. Mood & affect appropriate.                Diet Orders (From admission, onward)     Start     Ordered   08/01/24 0001  Diet NPO time specified  Diet effective midnight        07/31/24 9167   07/29/24 2154  Diet heart healthy/carb modified Room service appropriate? Yes; Fluid consistency: Thin  Diet effective now       Question Answer Comment  Diet-HS Snack? Nothing   Room service appropriate? Yes   Fluid consistency: Thin      07/29/24 2154            Objective: Vitals:   07/30/24 2048 07/31/24 0012 07/31/24 0421 07/31/24 0808  BP:  (!) 144/83 (!) 162/79 (!) 156/43  Pulse: 61 (!) 48 (!) 49 (!) 52  Resp: 15 16 15 18   Temp: 98 F (36.7 C) 97.7 F (36.5 C) 98.5 F (36.9 C) 97.9 F (36.6 C)  TempSrc: Oral Oral Oral Oral  SpO2: 99% 96% 98% 97%  Weight:      Height:        Intake/Output Summary (Last 24 hours) at 07/31/2024 1105 Last data filed at 07/31/2024 0354 Gross per 24 hour  Intake 733.17 ml  Output --  Net 733.17 ml   Filed Weights   07/29/24 1618  Weight: 69.4 kg    Scheduled Meds:  amLODipine   5 mg Oral Daily   atorvastatin   5 mg Oral Daily   hydroxychloroquine  200 mg Oral Daily   insulin  aspart  0-5 Units Subcutaneous QHS   insulin  aspart  0-9 Units Subcutaneous TID WC   pantoprazole   40 mg Oral Daily   Continuous Infusions:  Nutritional status     Body mass index is 23.96 kg/m.  Data Reviewed:   CBC: Recent Labs  Lab 07/29/24 1639 07/30/24 0523 07/31/24 0837  WBC 4.2 4.0 3.7*  HGB 11.9* 11.3* 12.0  HCT 37.4 35.4* 37.0  MCV 88.8 88.3 87.3  PLT 268 257 278   Basic Metabolic Panel: Recent Labs  Lab 07/29/24 1639 07/29/24 1822 07/30/24 0523 07/30/24 1103 07/31/24 0837  NA 141  --  137 136 143  K 3.4*  --  3.4*  4.1 3.6  CL 106  --  106 105 105  CO2 22  --  20* 21* 21*  GLUCOSE 86  --  88 89 88  BUN <5*  --  6* 5* 5*  CREATININE 0.66  --  0.65 0.68 0.71  CALCIUM  8.7*  --  8.3* 8.4* 8.6*  MG  --  1.7  --  1.8 1.6*  PHOS  --   --   --  3.8  --    GFR: Estimated Creatinine Clearance: 52.7 mL/min (by C-G formula based on SCr of 0.71 mg/dL). Liver Function Tests: Recent Labs  Lab 07/30/24 1103  AST 29  ALT 11  ALKPHOS 51  BILITOT 1.4*  PROT 6.9  ALBUMIN 3.1*   Recent Labs  Lab 07/30/24 1103  LIPASE 48   No results for input(s): AMMONIA in the last 168 hours. Coagulation Profile: No results for input(s): INR, PROTIME in the last 168 hours. Cardiac Enzymes: No results for input(s): CKTOTAL, CKMB, CKMBINDEX, TROPONINI in the last 168 hours. BNP (last 3 results) No results for input(s): PROBNP in the last 8760 hours. HbA1C: No results for input(s): HGBA1C in the last 72 hours. CBG: Recent Labs  Lab 07/30/24 0740 07/30/24 1204 07/30/24 1717 07/30/24 2139 07/31/24 0805  GLUCAP 90 88 87 96 79   Lipid Profile: No results for input(s): CHOL, HDL, LDLCALC, TRIG, CHOLHDL, LDLDIRECT in the last 72 hours. Thyroid  Function Tests: Recent Labs    07/29/24 1640  TSH 1.572   Anemia Panel: No results for input(s): VITAMINB12, FOLATE, FERRITIN, TIBC, IRON, RETICCTPCT in the last 72 hours. Sepsis Labs: No results for input(s): PROCALCITON, LATICACIDVEN in the last 168 hours.  No results found for this or any previous visit (from the past 240 hours).       Radiology Studies: CT ABDOMEN PELVIS W CONTRAST Result Date: 07/30/2024 CLINICAL DATA:  Worsening abdominal pain EXAM: CT ABDOMEN AND PELVIS WITH CONTRAST TECHNIQUE: Multidetector CT imaging of the abdomen and pelvis was performed using the standard protocol following bolus administration of intravenous contrast. RADIATION DOSE REDUCTION: This exam was performed according to the  departmental dose-optimization program which includes automated exposure control, adjustment of the mA and/or kV according to patient size and/or use of iterative reconstruction technique. CONTRAST:  75mL OMNIPAQUE  IOHEXOL  350 MG/ML SOLN COMPARISON:  02/15/2021 FINDINGS: Lower chest: Small right-sided pleural effusion is noted. No focal infiltrate is seen. Hepatobiliary: No focal liver abnormality is seen. Status post cholecystectomy. No biliary dilatation. Pancreas: Unremarkable. No pancreatic ductal dilatation or surrounding inflammatory changes. Spleen: Normal in size without focal abnormality. Adrenals/Urinary Tract: Adrenal glands are within normal limits. Kidneys demonstrate a normal enhancement pattern bilaterally. No  renal calculi or obstructive changes are seen. The bladder is well distended. Stomach/Bowel: No obstructive or inflammatory changes of the colon are seen. Mild diverticular changes noted. The appendix is within normal limits. Small bowel and stomach are unremarkable. Vascular/Lymphatic: Aortic atherosclerosis. No enlarged abdominal or pelvic lymph nodes. Reproductive: Uterus demonstrates calcified fibroids stable from the prior exam. No adnexal mass is seen. Other: None Musculoskeletal: Degenerative changes of lumbar spine are noted. 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 Tiny right pleural effusion. Electronically Signed   By: Oneil Devonshire M.D.   On: 07/30/2024 02:48   CT Head Wo Contrast Result Date: 07/29/2024 EXAM: CT HEAD WITHOUT CONTRAST 07/29/2024 05:47:57 PM TECHNIQUE: CT of the head was performed without the administration of intravenous contrast. Automated exposure control, iterative reconstruction, and/or weight based adjustment of the mA/kV was utilized to reduce the radiation dose to as low as reasonably achievable. COMPARISON: None available. CLINICAL HISTORY: Headache, new onset (Age >= 51y); HA HTN. FINDINGS:  BRAIN AND VENTRICLES: Atherosclerotic calcifications are present in the cavernous carotid arteries bilaterally. No hyperdense vessel is present. No acute hemorrhage. No evidence of acute infarct. No hydrocephalus. No extra-axial collection. No mass effect or midline shift. ORBITS: Bilateral lens replacements are noted. The globes and orbits are otherwise within normal limits. SINUSES: Wall thickening is present in the left maxillary sinus suggesting history of chronic disease. No active sinus disease is present. SOFT TISSUES AND SKULL: No acute soft tissue abnormality. No skull fracture. IMPRESSION: 1. No acute intracranial abnormality. 2. Atherosclerotic calcifications in the cavernous carotid arteries bilaterally. No hyperdense vessel. Electronically signed by: Lonni Necessary MD 07/29/2024 05:59 PM EDT RP Workstation: [id number]   DG Chest Portable 1 View Result Date: 07/29/2024 CLINICAL DATA:  Bradycardia and hypertension EXAM: PORTABLE CHEST 1 VIEW COMPARISON:  Chest radiograph dated 08/26/2023 FINDINGS: Normal lung volumes. No focal consolidations. No pleural effusion or pneumothorax. Enlarged cardiomediastinal silhouette is likely projectional. No acute osseous abnormality. IMPRESSION: No active disease. Electronically Signed   By: Limin  Xu M.D.   On: 07/29/2024 17:18           LOS: 1 day   Time spent= 35 mins    Burgess JAYSON Dare, MD [facility]  If 7PM-7AM, please contact night-coverage  07/31/2024, 11:05 AM

## 2024-07-31 NOTE — Plan of Care (Signed)
 Pt HR drops in the low 30s while sleeping, she is asymptomatic when this happens. Looks to be going between a 1st degree heart block and a 2nd degree type 2 heart block. Hospitalist was made aware of this, no changes were advised.  Problem: Clinical Measurements: Goal: Ability to maintain clinical measurements within normal limits will improve Outcome: Not Progressing

## 2024-08-01 ENCOUNTER — Inpatient Hospital Stay (HOSPITAL_COMMUNITY)
Admission: EM | Disposition: A | Discharge status: Home Health | Payer: Self-pay | Source: Home / Self Care | Attending: Internal Medicine

## 2024-08-01 DIAGNOSIS — I441 Atrioventricular block, second degree: Secondary | ICD-10-CM | POA: Diagnosis not present

## 2024-08-01 DIAGNOSIS — R001 Bradycardia, unspecified: Secondary | ICD-10-CM | POA: Diagnosis not present

## 2024-08-01 HISTORY — PX: PACEMAKER IMPLANT: EP1218

## 2024-08-01 LAB — CBC
HCT: 35.8 % — ABNORMAL LOW (ref 36.0–46.0)
Hemoglobin: 11.7 g/dL — ABNORMAL LOW (ref 12.0–15.0)
MCH: 28.5 pg (ref 26.0–34.0)
MCHC: 32.7 g/dL (ref 30.0–36.0)
MCV: 87.1 fL (ref 80.0–100.0)
Platelets: 255 K/uL (ref 150–400)
RBC: 4.11 MIL/uL (ref 3.87–5.11)
RDW: 16.6 % — ABNORMAL HIGH (ref 11.5–15.5)
WBC: 3.1 K/uL — ABNORMAL LOW (ref 4.0–10.5)
nRBC: 0 % (ref 0.0–0.2)

## 2024-08-01 LAB — GLUCOSE, CAPILLARY
Glucose-Capillary: 89 mg/dL (ref 70–99)
Glucose-Capillary: 83 mg/dL (ref 70–99)
Glucose-Capillary: 76 mg/dL (ref 70–99)
Glucose-Capillary: 120 mg/dL — ABNORMAL HIGH (ref 70–99)

## 2024-08-01 LAB — BASIC METABOLIC PANEL WITH GFR
Anion gap: 11 (ref 5–15)
BUN: 6 mg/dL — ABNORMAL LOW (ref 8–23)
CO2: 22 mmol/L (ref 22–32)
Calcium: 8.4 mg/dL — ABNORMAL LOW (ref 8.9–10.3)
Chloride: 104 mmol/L (ref 98–111)
Creatinine, Ser: 0.74 mg/dL (ref 0.44–1.00)
GFR, Estimated: >60 mL/min (ref >60)
Glucose, Bld: 94 mg/dL (ref 70–99)
Potassium: 3.5 mmol/L (ref 3.5–5.1)
Sodium: 137 mmol/L (ref 135–145)

## 2024-08-01 LAB — MAGNESIUM: Magnesium: 1.7 mg/dL (ref 1.7–2.4)

## 2024-08-01 SURGERY — PACEMAKER IMPLANT

## 2024-08-01 MED ORDER — SODIUM CHLORIDE 0.9% FLUSH
3.0000 mL | Freq: Two times a day (BID) | INTRAVENOUS | Status: DC
  Administered 2024-08-01 – 2024-08-02 (×3): 3 mL via INTRAVENOUS

## 2024-08-01 MED ORDER — LIDOCAINE HCL 1 % IJ SOLN
INTRAMUSCULAR | Status: AC
  Filled 2024-08-01: qty 60

## 2024-08-01 MED ORDER — CHLORHEXIDINE GLUCONATE 4 % EX SOLN: 60.0000 mL | Freq: Once | CUTANEOUS | Status: DC

## 2024-08-01 MED ORDER — CEFAZOLIN SODIUM-DEXTROSE 2-4 GM/100ML-% IV SOLN
2.0000 g | INTRAVENOUS | Status: AC
  Administered 2024-08-01: 2 g via INTRAVENOUS
  Filled 2024-08-01: qty 100

## 2024-08-01 MED ORDER — SODIUM CHLORIDE 0.9% FLUSH: 3.0000 mL | INTRAVENOUS | Status: DC | PRN

## 2024-08-01 MED ORDER — POTASSIUM CHLORIDE 10 MEQ/100ML IV SOLN
10.0000 meq | INTRAVENOUS | Status: DC
Start: 2024-08-01 — End: 2024-08-01
  Administered 2024-08-01: 10 meq via INTRAVENOUS
  Filled 2024-08-01: qty 100

## 2024-08-01 MED ORDER — CEFAZOLIN SODIUM-DEXTROSE 2-4 GM/100ML-% IV SOLN
INTRAVENOUS | Status: AC
  Filled 2024-08-01: qty 100

## 2024-08-01 MED ORDER — GENTAMICIN SULFATE 40 MG/ML IJ SOLN
80.0000 mg | INTRAMUSCULAR | Status: AC
  Administered 2024-08-01: 80 mg
  Filled 2024-08-01: qty 2

## 2024-08-01 MED ORDER — LIDOCAINE HCL (PF) 1 % IJ SOLN
INTRAMUSCULAR | Status: DC | PRN
  Administered 2024-08-01: 60 mL

## 2024-08-01 MED ORDER — FENTANYL CITRATE (PF) 100 MCG/2ML IJ SOLN
INTRAMUSCULAR | Status: AC
  Filled 2024-08-01: qty 2

## 2024-08-01 MED ORDER — FENTANYL CITRATE (PF) 100 MCG/2ML IJ SOLN
INTRAMUSCULAR | Status: DC | PRN
  Administered 2024-08-01: 25 ug via INTRAVENOUS

## 2024-08-01 MED ORDER — MIDAZOLAM HCL 2 MG/2ML IJ SOLN
INTRAMUSCULAR | Status: AC
  Filled 2024-08-01: qty 2

## 2024-08-01 MED ORDER — SODIUM CHLORIDE 0.9 % IV SOLN: INTRAVENOUS | Status: DC

## 2024-08-01 MED ORDER — MAGNESIUM SULFATE 2 GM/50ML IV SOLN
2.0000 g | Freq: Once | INTRAVENOUS | Status: AC
  Administered 2024-08-01: 2 g via INTRAVENOUS
  Filled 2024-08-01: qty 50

## 2024-08-01 MED ORDER — HEPARIN (PORCINE) IN NACL 1000-0.9 UT/500ML-% IV SOLN
INTRAVENOUS | Status: DC | PRN
  Administered 2024-08-01: 500 mL

## 2024-08-01 MED ORDER — SODIUM CHLORIDE 0.9 % IV SOLN
INTRAVENOUS | Status: AC
  Filled 2024-08-01: qty 2

## 2024-08-01 MED ORDER — MIDAZOLAM HCL 5 MG/5ML IJ SOLN
INTRAMUSCULAR | Status: DC | PRN
  Administered 2024-08-01: 1 mg via INTRAVENOUS

## 2024-08-01 MED ORDER — POTASSIUM CHLORIDE CRYS ER 20 MEQ PO TBCR
40.0000 meq | EXTENDED_RELEASE_TABLET | Freq: Once | ORAL | Status: AC
  Administered 2024-08-01: 40 meq via ORAL
  Filled 2024-08-01: qty 2

## 2024-08-01 SURGICAL SUPPLY — 14 items
CABLE SURGICAL S-101-97-12 (CABLE) ×1 IMPLANT
CATH CPS LOCATOR 3D MED (CATHETERS) IMPLANT
CATH CPS LOCATOR 3D SM (CATHETERS) IMPLANT
LEAD ULTIPACE 52 LPA1231/52 (Lead) IMPLANT
LEAD ULTIPACE 65 LPA1231/65 (Lead) IMPLANT
PACEMAKER ASSURITY DR-RF (Pacemaker) IMPLANT
PAD DEFIB RADIO PHYSIO CONN (PAD) ×1 IMPLANT
SHEATH 7FR PRELUDE SNAP 13 (SHEATH) IMPLANT
SHEATH 9FR PRELUDE SNAP 13 (SHEATH) IMPLANT
SHEATH PROBE COVER 6X72 (BAG) IMPLANT
SLITTER AGILIS HISPRO (INSTRUMENTS) IMPLANT
TOOL HELIX LOCKING (MISCELLANEOUS) IMPLANT
TRAY PACEMAKER INSERTION (PACKS) ×1 IMPLANT
WIRE HI TORQ VERSACORE-J 145CM (WIRE) IMPLANT

## 2024-08-01 NOTE — Progress Notes (Addendum)
  Patient Name: [name] Date of Encounter: 08/01/2024  Primary Cardiologist: None Electrophysiologist: None  Interval Summary   The patient is doing well today.  At this time, the patient denies chest pain, shortness of breath, or any new concerns.  Vital Signs    Vitals:   07/31/24 2012 08/01/24 0045 08/01/24 0346 08/01/24 0746  BP: 103/80 (!) 136/37 (!) 161/69 (!) 132/46  Pulse:  (!) 57 69 69  Resp: 16 15 16 15   Temp: 97.7 F (36.5 C) 98 F (36.7 C) 98 F (36.7 C) 98 F (36.7 C)  TempSrc: Oral Oral Oral Oral  SpO2: 94%  94%   Weight:      Height:        Intake/Output Summary (Last 24 hours) at 08/01/2024 0834 Last data filed at 07/31/2024 0900 Gross per 24 hour  Intake 200 ml  Output --  Net 200 ml   Filed Weights   07/29/24 1618  Weight: 69.4 kg    Physical Exam    GEN- NAD, Alert and oriented  Lungs- Clear to ausculation bilaterally, normal work of breathing Cardiac- irregular rate and rhythm, no murmurs, rubs or gallops GI- soft, NT, ND, + BS Extremities- no clubbing or cyanosis. No edema  Telemetry    Second degree AVB 50-80 bpm (personally reviewed)  Hospital Course    [name] is a 83 y.o. female with PMH of bradycardia, SVT s/p ablation 1980's, HTN, RA admitted for slow HR and elevated BP. 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    Assessment & Plan    Second Degree AVB / Mobitz I  -NPO for PPM implantation  -procedure reviewed with patient at bedside & husband. Risks / benefits reviewed. -heparin gtt on hold since 07/30/24  -ok for pt to have a small cup of coffee up to 0930 then NPO  Atrial Flutter  Noted by Cardiology, none seen on tele or by EKG since admit  -tele monitoring  -heparin on hold as above  Chest Pressure -ECHO 65-70% LVEF, normal wall motion > no further work up  SVT  Hx of ablation 1980's  -monitor   For questions or updates, please contact [facility] Please consult [email or web address] for  contact info under     Signed, Amy Barrack, NP-C, AGACNP-BC [facility] - Electrophysiology  08/01/2024, 8:34 AM    [name] was seen by me today along with [name]. I have personally performed an evaluation on this patient.  My findings are as follows: 83 y.o. female continues to do well.  Remains in Mobitz 1 AV block..  Data: EKG(s) and pertinent labs, studies, etc were personally reviewed and interpreted by me:  EKG, telemetry Otherwise, I agree with data as outlined by the advanced practice provider.  Exam performed by me: Gen: No acute distress Neck: No JVD Cardiac: Bradycardic Lungs: Normal work of breathing Extremities: No edema  My Assessment and Plan: 1.  Mobitz 1 AV block: Has had continued episodes of heart block.  [name] need pacemaker implant.  Risk and benefits have been discussed.  The patient understands the risks and has agreed to the procedure.  Explained risks, benefits, and alternatives to PPM implantation, including but not limited to bleeding, infection, pneumothorax, pericardial effusion, lead dislodgement, heart attack, stroke, or death.  Pt verbalized understanding and agrees to proceed.   Signed,  Amy Northway Gladis Norton, MD  08/01/2024 12:11 PM

## 2024-08-01 NOTE — Progress Notes (Signed)
 Mobility Specialist Progress Note:   08/01/24 0910  Mobility  Activity Ambulated with assistance  Level of Assistance Contact guard assist, steadying assist  Assistive Device Front wheel walker  Distance Ambulated (ft) 100 ft  Activity Response Tolerated well  Mobility Referral Yes  Mobility visit 1 Mobility  Mobility Specialist Start Time (ACUTE ONLY) 0910  Mobility Specialist Stop Time (ACUTE ONLY) 0920  Mobility Specialist Time Calculation (min) (ACUTE ONLY) 10 min   Pt agreeable to mobility session. HR 50s upon arrival, 70-80s with exertion. Pt c/o full body arthritic pain with ambulation, limiting distance at this time. No other symptoms reported. Pt left sitting EOB with all needs met.  Therisa Rana Mobility Specialist Please contact via SecureChat or  Rehab office at [phone]

## 2024-08-01 NOTE — Progress Notes (Signed)
 PROGRESS NOTE    [name]  FMW:[id number] DOB: September 20, 1941 DOA: 07/29/2024 PCP: Randeen Laine LABOR, MD    Brief Narrative:  83 year old female with history of diabetes mellitus type 2, HTN, RA comes to the ED with complaints of bradycardia with heart rate in 30s and elevated blood pressure.  Patient was seen by cardiology team.  TSH is normal troponins are relatively flat.  Plans for pacemaker placement today.  Abdominal pain has resolved, workup has been negative.   Assessment & Plan:   Bradycardia, new diagnosis of SVT? Resolved.  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  EP service planning on pacemaker placement today - Echocardiogram-preserved EF with moderate MR - dc hep for now. No flutter on tele per EP - Cardiology/EP to see the patient today   Abdominal pain, nonspecific -CT abdomen pelvis is negative.  LFTs and lipase are normal   Hypertensive urgency -Initial systolic blood pressures greater than 200.  Amlodipine  and hydralazine have been initiated.  IV as needed   Chest pressure  Poor historian, troponins are flat.  Echocardiogram ordered   Diabetes mellitus-A1c 6.1- 12/02/23. -Holding home med.  Sliding scale and Accu-Cheks   Rheumatoid arthritis -Pain control.  On Plaquenil  DVT prophylaxis: Heparin drip    Code Status: Full Code Family Communication: Spouse is present at bedside Plans for pacemaker placement today   PT Follow up Recs:   Subjective: No complaints denies any chest pain  Examination:  General exam: Appears calm and comfortable  Respiratory system: Clear to auscultation. Respiratory effort normal. Cardiovascular system: S1 & S2 heard, RRR. No JVD, murmurs, rubs, gallops or clicks. No pedal edema. Gastrointestinal system: Abdomen is nondistended, soft and nontender. No organomegaly or masses felt. Normal bowel sounds heard. Central nervous system: Alert and oriented. No focal neurological  deficits. Extremities: Symmetric 5 x 5 power. Skin: No rashes, lesions or ulcers Psychiatry: Judgement and insight appear normal. Mood & affect appropriate.                Diet Orders (From admission, onward)     Start     Ordered   08/01/24 0930  Diet NPO time specified  Diet effective ____        08/01/24 0916            Objective: Vitals:   07/31/24 2012 08/01/24 0045 08/01/24 0346 08/01/24 0746  BP: 103/80 (!) 136/37 (!) 161/69 (!) 132/46  Pulse:  (!) 57 69 69  Resp: 16 15 16 15   Temp: 97.7 F (36.5 C) 98 F (36.7 C) 98 F (36.7 C) 98 F (36.7 C)  TempSrc: Oral Oral Oral Oral  SpO2: 94%  94%   Weight:      Height:       No intake or output data in the 24 hours ending 08/01/24 1046 Filed Weights   07/29/24 1618  Weight: 69.4 kg    Scheduled Meds:  amLODipine   5 mg Oral Daily   atorvastatin   5 mg Oral Daily   chlorhexidine  60 mL Topical Once   chlorhexidine  60 mL Topical Once   gentamicin (GARAMYCIN) 80 mg in sodium chloride  0.9 % 500 mL irrigation  80 mg Irrigation On Call   hydroxychloroquine  200 mg Oral Daily   Influenza vac split trivalent PF  0.5 mL Intramuscular Tomorrow-1000   insulin  aspart  0-5 Units Subcutaneous QHS   insulin  aspart  0-9 Units Subcutaneous TID WC  pantoprazole   40 mg Oral Daily   potassium chloride   40 mEq Oral Once   sodium chloride  flush  3 mL Intravenous Q12H   Continuous Infusions:  sodium chloride  50 mL/hr at 08/01/24 1010    ceFAZolin (ANCEF) IV     magnesium  sulfate bolus IVPB 2 g (08/01/24 1011)    Nutritional status     Body mass index is 23.96 kg/m.  Data Reviewed:   CBC: Recent Labs  Lab 07/29/24 1639 07/30/24 0523 07/31/24 0837 08/01/24 0504  WBC 4.2 4.0 3.7* 3.1*  HGB 11.9* 11.3* 12.0 11.7*  HCT 37.4 35.4* 37.0 35.8*  MCV 88.8 88.3 87.3 87.1  PLT 268 257 278 255   Basic Metabolic Panel: Recent Labs  Lab 07/29/24 1639 07/29/24 1822 07/30/24 0523 07/30/24 1103 07/31/24 0837  08/01/24 0504  NA 141  --  137 136 143 137  K 3.4*  --  3.4* 4.1 3.6 3.5  CL 106  --  106 105 105 104  CO2 22  --  20* 21* 21* 22  GLUCOSE 86  --  88 89 88 94  BUN <5*  --  6* 5* 5* 6*  CREATININE 0.66  --  0.65 0.68 0.71 0.74  CALCIUM  8.7*  --  8.3* 8.4* 8.6* 8.4*  MG  --  1.7  --  1.8 1.6* 1.7  PHOS  --   --   --  3.8  --   --    GFR: Estimated Creatinine Clearance: 52.7 mL/min (by C-G formula based on SCr of 0.74 mg/dL). Liver Function Tests: Recent Labs  Lab 07/30/24 1103  AST 29  ALT 11  ALKPHOS 51  BILITOT 1.4*  PROT 6.9  ALBUMIN 3.1*   Recent Labs  Lab 07/30/24 1103  LIPASE 48   No results for input(s): AMMONIA in the last 168 hours. Coagulation Profile: No results for input(s): INR, PROTIME in the last 168 hours. Cardiac Enzymes: No results for input(s): CKTOTAL, CKMB, CKMBINDEX, TROPONINI in the last 168 hours. BNP (last 3 results) No results for input(s): PROBNP in the last 8760 hours. HbA1C: No results for input(s): HGBA1C in the last 72 hours. CBG: Recent Labs  Lab 07/31/24 0805 07/31/24 1140 07/31/24 1737 07/31/24 2047 08/01/24 0744  GLUCAP 79 88 105* 93 89   Lipid Profile: No results for input(s): CHOL, HDL, LDLCALC, TRIG, CHOLHDL, LDLDIRECT in the last 72 hours. Thyroid  Function Tests: Recent Labs    07/29/24 1640  TSH 1.572   Anemia Panel: No results for input(s): VITAMINB12, FOLATE, FERRITIN, TIBC, IRON, RETICCTPCT in the last 72 hours. Sepsis Labs: No results for input(s): PROCALCITON, LATICACIDVEN in the last 168 hours.  No results found for this or any previous visit (from the past 240 hours).       Radiology Studies: ECHOCARDIOGRAM COMPLETE Result Date: 07/31/2024    ECHOCARDIOGRAM REPORT   Patient Name:   [name] Date of Exam: 07/31/2024 Medical Rec #:  [id number]        Height:       67.0 in Accession #:    [id number]       Weight:       153.0 lb Date of Birth:   Aug 13, 1941        BSA:          1.805 m Patient Age:    83 years         BP:           162/79 mmHg Patient Gender: F  HR:           49 bpm. Exam Location:  Inpatient Procedure: 2D Echo, Cardiac Doppler and Color Doppler (Both Spectral and Color            Flow Doppler were utilized during procedure). Indications:    Atrial Flutter I48.92  History:        Patient has no prior history of Echocardiogram examinations.                 Risk Factors:Hypertension, Diabetes and Former Smoker.  Sonographer:    Jayson Gaskins Referring Phys: [id number] CALLIE E GOODRICH IMPRESSIONS  1. Left ventricular ejection fraction, by estimation, is 65 to 70%. The left ventricle has normal function. The left ventricle has no regional wall motion abnormalities. There is mild concentric left ventricular hypertrophy. Left ventricular diastolic parameters are indeterminate.  2. Right ventricular systolic function is normal. The right ventricular size is normal. There is normal pulmonary artery systolic pressure. The estimated right ventricular systolic pressure is 32.6 mmHg.  3. Left atrial size was mildly dilated.  4. The mitral valve is grossly normal. Mild to moderate mitral valve regurgitation.  5. The aortic valve is tricuspid. There is moderate calcification of the aortic valve. Aortic valve regurgitation is mild. Aortic valve mean gradient measures 5.0 mmHg.  6. The inferior vena cava is normal in size with <50% respiratory variability, suggesting right atrial pressure of 8 mmHg. Comparison(s): No prior Echocardiogram. FINDINGS  Left Ventricle: Left ventricular ejection fraction, by estimation, is 65 to 70%. The left ventricle has normal function. The left ventricle has no regional wall motion abnormalities. The left ventricular internal cavity size was normal in size. There is  mild concentric left ventricular hypertrophy. Left ventricular diastolic parameters are indeterminate. Right Ventricle: The right ventricular size is  normal. No increase in right ventricular wall thickness. Right ventricular systolic function is normal. There is normal pulmonary artery systolic pressure. The tricuspid regurgitant velocity is 2.48 m/s, and  with an assumed right atrial pressure of 8 mmHg, the estimated right ventricular systolic pressure is 32.6 mmHg. Left Atrium: Left atrial size was mildly dilated. Right Atrium: Right atrial size was normal in size. Pericardium: There is no evidence of pericardial effusion. Mitral Valve: The mitral valve is grossly normal. Mild mitral annular calcification. Mild to moderate mitral valve regurgitation. Tricuspid Valve: The tricuspid valve is grossly normal. Tricuspid valve regurgitation is mild. Aortic Valve: The aortic valve is tricuspid. There is moderate calcification of the aortic valve. There is moderate aortic valve annular calcification. Aortic valve regurgitation is mild. Aortic valve mean gradient measures 5.0 mmHg. Aortic valve peak gradient measures 8.5 mmHg. Aortic valve area, by VTI measures 2.12 cm. Pulmonic Valve: The pulmonic valve was not well visualized. Pulmonic valve regurgitation is trivial. Aorta: The aortic root is normal in size and structure. Venous: The inferior vena cava is normal in size with less than 50% respiratory variability, suggesting right atrial pressure of 8 mmHg. IAS/Shunts: No atrial level shunt detected by color flow Doppler. Additional Comments: 3D was performed not requiring image post processing on an independent workstation and was indeterminate.  LEFT VENTRICLE PLAX 2D LVIDd:         3.60 cm   Diastology LVIDs:         1.70 cm   LV e' medial:    8.38 cm/s LV PW:         1.10 cm   LV E/e' medial:  13.0 LV IVS:  1.00 cm   LV e' lateral:   9.03 cm/s LVOT diam:     1.70 cm   LV E/e' lateral: 12.1 LV SV:         73 LV SV Index:   40 LVOT Area:     2.27 cm  RIGHT VENTRICLE RV S prime:     12.90 cm/s TAPSE (M-mode): 3.4 cm LEFT ATRIUM             Index        RIGHT  ATRIUM           Index LA Vol (A2C):   31.5 ml 17.46 ml/m  RA Area:     13.10 cm LA Vol (A4C):   70.5 ml 39.07 ml/m  RA Volume:   26.40 ml  14.63 ml/m LA Biplane Vol: 48.2 ml 26.71 ml/m  AORTIC VALVE AV Area (Vmax):    2.18 cm AV Area (Vmean):   2.18 cm AV Area (VTI):     2.12 cm AV Vmax:           146.00 cm/s AV Vmean:          105.000 cm/s AV VTI:            0.342 m AV Peak Grad:      8.5 mmHg AV Mean Grad:      5.0 mmHg LVOT Vmax:         140.00 cm/s LVOT Vmean:        101.000 cm/s LVOT VTI:          0.320 m LVOT/AV VTI ratio: 0.94  AORTA Ao Root diam: 2.90 cm MITRAL VALVE                TRICUSPID VALVE MV Area (PHT): 3.27 cm     TR Peak grad:   24.6 mmHg MV Decel Time: 232 msec     TR Vmax:        248.00 cm/s MV E velocity: 109.00 cm/s MV A velocity: 112.00 cm/s  SHUNTS MV E/A ratio:  0.97         Systemic VTI:  0.32 m                             Systemic Diam: 1.70 cm Jayson Sierras MD Electronically signed by Jayson Sierras MD Signature Date/Time: 07/31/2024/1:11:58 PM    Final            LOS: 2 days   Time spent= 35 mins    Burgess JAYSON Dare, MD [facility]  If 7PM-7AM, please contact night-coverage  08/01/2024, 10:46 AM

## 2024-08-02 ENCOUNTER — Encounter (HOSPITAL_COMMUNITY): Payer: Self-pay | Admitting: Cardiology

## 2024-08-02 ENCOUNTER — Inpatient Hospital Stay (HOSPITAL_COMMUNITY)

## 2024-08-02 DIAGNOSIS — R001 Bradycardia, unspecified: Secondary | ICD-10-CM | POA: Diagnosis not present

## 2024-08-02 DIAGNOSIS — Z981 Arthrodesis status: Secondary | ICD-10-CM | POA: Diagnosis not present

## 2024-08-02 DIAGNOSIS — Z950 Presence of cardiac pacemaker: Secondary | ICD-10-CM | POA: Diagnosis not present

## 2024-08-02 DIAGNOSIS — I441 Atrioventricular block, second degree: Secondary | ICD-10-CM | POA: Diagnosis not present

## 2024-08-02 DIAGNOSIS — I700 Atherosclerosis of aorta: Secondary | ICD-10-CM | POA: Diagnosis not present

## 2024-08-02 LAB — BASIC METABOLIC PANEL WITH GFR
Anion gap: 10 (ref 5–15)
BUN: <5 mg/dL — ABNORMAL LOW (ref 8–23)
CO2: 23 mmol/L (ref 22–32)
Calcium: 8.3 mg/dL — ABNORMAL LOW (ref 8.9–10.3)
Chloride: 105 mmol/L (ref 98–111)
Creatinine, Ser: 0.67 mg/dL (ref 0.44–1.00)
GFR, Estimated: >60 mL/min (ref >60)
Glucose, Bld: 85 mg/dL (ref 70–99)
Potassium: 3.8 mmol/L (ref 3.5–5.1)
Sodium: 138 mmol/L (ref 135–145)

## 2024-08-02 LAB — CBC
HCT: 35.5 % — ABNORMAL LOW (ref 36.0–46.0)
Hemoglobin: 11.4 g/dL — ABNORMAL LOW (ref 12.0–15.0)
MCH: 28 pg (ref 26.0–34.0)
MCHC: 32.1 g/dL (ref 30.0–36.0)
MCV: 87.2 fL (ref 80.0–100.0)
Platelets: 239 K/uL (ref 150–400)
RBC: 4.07 MIL/uL (ref 3.87–5.11)
RDW: 16.5 % — ABNORMAL HIGH (ref 11.5–15.5)
WBC: 3 K/uL — ABNORMAL LOW (ref 4.0–10.5)
nRBC: 0 % (ref 0.0–0.2)

## 2024-08-02 LAB — GLUCOSE, CAPILLARY: Glucose-Capillary: 115 mg/dL — ABNORMAL HIGH (ref 70–99)

## 2024-08-02 LAB — MAGNESIUM: Magnesium: 2.1 mg/dL (ref 1.7–2.4)

## 2024-08-02 MED FILL — Midazolam HCl Inj PF 2 MG/2ML (Base Equivalent): INTRAMUSCULAR | Qty: 2 | Status: AC

## 2024-08-02 NOTE — Discharge Instructions (Addendum)
 Care After Your Ablation and Pacemaker   You have a Abbott Pacemaker  ACTIVITY Do not lift your arm above shoulder height for 1 week after your procedure. After 7 days, you may progress as below.  You should remove your sling 24 hours after your procedure, unless otherwise instructed by your provider.     Tuesday August 09, 2024  Wednesday August 10, 2024 Thursday August 11, 2024 Friday August 12, 2024   Do not lift, push, pull, or carry anything over 10 pounds with the affected arm until 6 weeks (Tuesday September 13, 2024 ) after your procedure.   You may drive AFTER your wound check, unless you have been told otherwise by your provider.   Ask your healthcare provider when you can go back to work   INCISION/Dressing If you are on a blood thinner such as Coumadin, Xarelto, Eliquis, Plavix, or Pradaxa please confirm with your provider when this should be resumed.   If large square, outer bandage is left in place on your chest, this can be removed after 24 hours from your procedure. Do not remove steri-strips or glue as below.   Monitor your Pacemaker site for redness, swelling, and drainage. Call the device clinic at [phone] if you experience these symptoms or fever/chills.  If your incision is sealed with Steri-strips or staples, you may shower 7 days after your procedure or when told by your provider. Do not remove the steri-strips or let the shower hit directly on your site. You may wash around your site with soap and water.    If you were discharged in a sling, please do not wear this during the day more than 48 hours after your surgery unless otherwise instructed. This may increase the risk of stiffness and soreness in your shoulder.   Avoid lotions, ointments, or perfumes over your incision until it is well-healed.  You may use a hot tub or a pool AFTER your wound check appointment if the incision is completely closed.  Pacemaker Alerts:  Some alerts are vibratory and  others beep. These are NOT emergencies. Please call our office to let us  know. If this occurs at night or on weekends, it can wait until the next business day. Send a remote transmission.  If your device is capable of reading fluid status (for heart failure), you will be offered monthly monitoring to review this with you.   DEVICE MANAGEMENT Remote monitoring is used to monitor your pacemaker from home. This monitoring is scheduled every 91 days by our office. It allows us  to keep an eye on the functioning of your device to ensure it is working properly. You will routinely see your Electrophysiologist annually (more often if necessary).   You should receive your ID card for your new device in 4-8 weeks. Keep this card with you at all times once received. Consider wearing a medical alert bracelet or necklace.  Your Pacemaker may be MRI compatible. This will be discussed at your next office visit/wound check.  You should avoid contact with strong electric or magnetic fields.   Do not use amateur (ham) radio equipment or electric (arc) welding torches. MP3 player headphones with magnets should not be used. Some devices are safe to use if held at least 12 inches (30 cm) from your Pacemaker. These include power tools, lawn mowers, and speakers. If you are unsure if something is safe to use, ask your health care provider.  When using your cell phone, hold it to the ear that is  on the opposite side from the Pacemaker. Do not leave your cell phone in a pocket over the Pacemaker.  You may safely use electric blankets, heating pads, computers, and microwave ovens.  What can I expect after the procedure? After the procedure, it is common to have: Bruising around your puncture site. Tenderness around your puncture site. Skipped heartbeats. Tiredness (fatigue).  Contact a health care provider if: You have redness, mild swelling, or pain around your puncture site. You have fluid or blood coming from your  puncture site that stops after applying firm pressure to the area. Your puncture site feels warm to the touch. You have pus or a bad smell coming from your puncture site. You have a fever. You have chest pain or discomfort that spreads to your neck, jaw, or arm. You are sweating a lot. You feel nauseous. You have a fast or irregular heartbeat. You have shortness of breath. You are dizzy or light-headed and feel the need to lie down. You have pain or numbness in the arm or leg closest to your puncture site.  Call the office right away if: You have chest pain. You feel more short of breath than you have felt before. You feel more light-headed than you have felt before. Your incision starts to open up. Your puncture site suddenly swells. Your puncture site is bleeding and the bleeding does not stop after applying firm pressure to the area. These symptoms may represent a serious problem that is an emergency. Do not wait to see if the symptoms will go away. Get medical help right away. Call your local emergency services (911 in the [place] Do not drive yourself to the hospital.  Puncture (groin) site care  Follow instructions from your health care provider about how to take care of your puncture site. Make sure you: If present, leave stitches (sutures), skin glue, or adhesive strips in place. These skin closures may need to stay in place for up to 2 weeks. If adhesive strip edges start to loosen and curl up, you may trim the loose edges. Do not remove adhesive strips completely unless your health care provider tells you to do that. If a large square bandage is present on your groin site, this may be removed 24 hours after surgery.  Check your puncture site every day for signs of infection. Check for: Redness, swelling, or pain. Fluid or blood. If your puncture site starts to bleed, lie down on your back, apply firm pressure to the area, and contact your health care provider. Warmth. Pus or a  bad smell. A pea or small marble sized lump at the site is normal and can take up to three months to resolve.  Driving As above, you may drive after your wound check, unless otherwise directed by your provider.  Do not drive or use heavy machinery while taking prescription pain medicine. Activity Avoid activities that take a lot of effort for at least 7 days after your procedure. Do not lift anything that is heavier than 5 lb (4.5 kg) for one week. After that, follow restrictions for your device as above.  No sexual activity for 1 week.  Return to your normal activities as told by your health care provider. Ask your health care provider what activities are safe for you. General instructions Take over-the-counter and prescription medicines only as told by your health care provider. Do not use any products that contain nicotine or tobacco, such as cigarettes and e-cigarettes. If you need help quitting, ask  your health care provider. Do not drink alcohol for 24 hours after your procedure. Keep all follow-up visits as told by your health care provider. This is important.  This information is not intended to replace advice given to you by your health care provider. Make sure you discuss any questions you have with your health care provider.

## 2024-08-02 NOTE — Progress Notes (Addendum)
 Discharge  Pt verbalized Understand of discharge plan of care.  PIV and Tele removed. Placed at nurse station.  CCMD/Jasmine.  Additional education provided in AVS. Patient refused Flu vaccination.  Volunteer called for Main A.

## 2024-08-02 NOTE — TOC CM/SW Note (Addendum)
 Transition of Care [facility]) - Inpatient Brief Assessment   Patient Details  Name: [name] MRN: [id number] Date of Birth: 1941-03-16  Transition of Care [facility]) CM/SW Contact:    Sudie Erminio Deems, RN Phone Number: 08/02/2024, 10:25 AM   Clinical Narrative: Patient presented for bradycardia-post PPM. PTA patient was independent from home with spouse. Patient has DME cane and rolling walker. Spouse was at the bedside during the visit and he will provide transportation home via private vehicle. Patient has insurance and PCP. No home needs identified by Inpatient Case Manager.    1125 08-02-24 PT recommendations are for [facility] PT. Patient states she is currently active with [facility] services. Patient asked me to call provider since they arranged services. Patient is active with [facility] and they will resume services. MD to place order and F2F. No further needs identified at this time.   Transition of Care Asessment: Insurance and Status: Insurance coverage has been reviewed Patient has primary care physician: Yes Home environment has been reviewed: reviewed Prior level of function:: independent Prior/Current Home Services: No current home services Social Drivers of Health Review: SDOH reviewed no interventions necessary Readmission risk has been reviewed: Yes Transition of care needs: no transition of care needs at this time

## 2024-08-02 NOTE — Progress Notes (Addendum)
  Patient Name: [name] Date of Encounter: 08/02/2024  Primary Cardiologist: None Electrophysiologist: None  Interval Summary   The patient is doing well today.  She is hopeful to go home. At this time, the patient denies chest pain, shortness of breath, or any new concerns.  Vital Signs    Vitals:   08/01/24 1626 08/01/24 2013 08/01/24 2330 08/02/24 0334  BP: (!) 168/69 135/65 (!) 149/67 (!) 156/74  Pulse: 61 77 77 72  Resp: 18 19 16 19   Temp: 98 F (36.7 C) 98.3 F (36.8 C) 98 F (36.7 C) 97.9 F (36.6 C)  TempSrc: Oral Oral Oral Oral  SpO2: 98%  99%   Weight:      Height:        Intake/Output Summary (Last 24 hours) at 08/02/2024 9247 Last data filed at 08/01/2024 2015 Gross per 24 hour  Intake 240 ml  Output --  Net 240 ml   Filed Weights   07/29/24 1618  Weight: 69.4 kg    Physical Exam    GEN- NAD, Alert and oriented  Lungs- Clear to ausculation bilaterally, normal work of breathing Cardiac- Regular rate and rhythm, no murmurs, rubs or gallops, PPM site wnl, steri-strips intact, no significant swelling or hematoma  GI- soft, NT, ND, + BS Extremities- no clubbing or cyanosis. No edema  Telemetry    VP 60-80's, largely 60's (personally reviewed)   Device:  Abbott Dual Chamber PPM, implanted 08/01/24 for 2nd Degree Mobitz I  Hospital Course    FABIANA DROMGOOLE is a 83 y.o. female with PMH of bradycardia, SVT s/p ablation 1980's, HTN, RA admitted for slow HR and elevated BP. 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  Underwent Abbott dual chamber PPM 08/01/24.   Assessment & Plan    Second Degree AVB / Mobitz I  S/P dual chamber Abbott PPM, LBA lead placement  -normal device function on interrogation am post implant  -CXR images reviewed, no PTX, leads in good position  -post procedure care reviewed with patient & husband  -f/u arranged for patient > 2 week wound check and 90 day post implant   ?Atrial Flutter  -none seen on tele since  admit  -Abbegayle Denault monitor on device for recurrence  -no role for OAC at this time   Chest Pressure  ECHO with LVEF 65-70%, normal wall motion  -no anginal symptoms currently  -no further work up at this time   SVT  Hx ablation in 1980's  -monitor    Ok for discharge from EP perspective.  Timing per TRH.  EP available PRN, please call back if new needs arise.     For questions or updates, please contact [facility] Please consult [email or web address] for contact info under     Signed, Daphne Barrack, NP-C, AGACNP-BC  [facility] - Electrophysiology  08/02/2024, 9:09 AM   I have seen and examined this patient with Daphne Barrack.  Agree with above, note added to reflect my findings.  On exam, regular rhythm, pacemaker site clean dry and intact.  She is now status post Abbott dual-chamber pacemaker for Mobitz 1 AV block.  Device functioning appropriately.  Sensing, threshold, impedance within normal limits as expected post implant.  Chest x-ray and interrogation without issue.  Okay for discharge today with follow-up in device clinic.   Kriston Pasquarello M. Brenlyn Beshara MD 08/02/2024 2:50 PM

## 2024-08-02 NOTE — Care Management Important Message (Signed)
 Important Message  Patient Details  Name: [name] MRN: [id number] Date of Birth: 1941/01/24   Important Message Given:  Yes - Medicare IM     Vonzell Arrie Sharps 08/02/2024, 10:05 AM

## 2024-08-02 NOTE — Progress Notes (Signed)
 Delay Discharge  Per Primary RN.   Needs OT eval  Discharge RN completed Med details.  Primary RN updated to review and update as needed.

## 2024-08-02 NOTE — Discharge Summary (Signed)
 Physician Discharge Summary  EDMUND RICK FMW:[id number] DOB: 04/01/1941 DOA: 07/29/2024  PCP: Randeen Laine LABOR, MD  Admit date: 07/29/2024 Discharge date: 08/02/2024  Admitted From: Home Disposition: Home  Recommendations for Outpatient Follow-up:  Follow up with PCP in 1-2 weeks Please obtain BMP/CBC in one week your next doctors visit.  Recommend outpatient follow-up with EP No anticoagulation necessary   Discharge Condition: Stable CODE STATUS: Full code Diet recommendation: Diabetic  Brief/Interim Summary: Brief Narrative:  83 year old female with history of diabetes mellitus type 2, HTN, RA comes to the ED with complaints of bradycardia with heart rate in 30s and elevated blood pressure.  Patient was seen by cardiology team.  TSH is normal troponins are relatively flat.  Abdominal pain resolved, workup was negative.  Eventually had successful dual-chamber pacemaker placement on 10/13 and was cleared for discharge the following day.   Assessment & Plan:   Bradycardia, new diagnosis of SVT? Resolved.  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   - Echocardiogram-preserved EF with moderate MR - dc hep for now. No flutter on tele per EP - Cardiology/EP following. Dual Chamber pacemaker implanted 10/13  Abdominal pain, nonspecific -CT abdomen pelvis is negative.  LFTs and lipase are normal   Hypertensive urgency -Initial systolic blood pressures greater than 200.  Amlodipine  and hydralazine have been initiated.  IV as needed   Chest pressure  Poor historian, troponins are flat.  Echocardiogram ordered   Diabetes mellitus-A1c 6.1- 12/02/23. -Holding home med.  Sliding scale and Accu-Cheks   Rheumatoid arthritis -Pain control.  On Plaquenil  DVT prophylaxis: SCDs Start: 08/01/24 1625Heparin drip    Code Status: Full Code Family Communication: Spouse is present at bedside Dc once cleared by EP   PT Follow up Recs:    Subjective: [place] well no complaints.  Spouse at bedside  Examination:  General exam: Appears calm and comfortable  Respiratory system: Clear to auscultation. Respiratory effort normal. Cardiovascular system: S1 & S2 heard, RRR. No JVD, murmurs, rubs, gallops or clicks. No pedal edema. Gastrointestinal system: Abdomen is nondistended, soft and nontender. No organomegaly or masses felt. Normal bowel sounds heard. Central nervous system: Alert and oriented. No focal neurological deficits. Extremities: Symmetric 5 x 5 power. Skin: No rashes, lesions or ulcers Psychiatry: Judgement and insight appear normal. Mood & affect appropriate.    Discharge Diagnoses:  Principal Problem:   Bradycardia Active Problems:   Essential hypertension   RA (rheumatoid arthritis) (HCC)   Diabetes mellitus treated with oral medication [facility])      Discharge Exam: Vitals:   08/02/24 0334 08/02/24 0840  BP: (!) 156/74 121/74  Pulse: 72 76  Resp: 19 18  Temp: 97.9 F (36.6 C) 98.6 F (37 C)  SpO2:  98%   Vitals:   08/01/24 2013 08/01/24 2330 08/02/24 0334 08/02/24 0840  BP: 135/65 (!) 149/67 (!) 156/74 121/74  Pulse: 77 77 72 76  Resp: 19 16 19 18   Temp: 98.3 F (36.8 C) 98 F (36.7 C) 97.9 F (36.6 C) 98.6 F (37 C)  TempSrc: Oral Oral Oral Oral  SpO2:  99%  98%  Weight:      Height:          Discharge Instructions   Allergies as of 08/02/2024       Reactions   Bee Venom Anaphylaxis   Dicyclomine     Blurry vision   Humira [adalimumab]    LIPS SWELLED AND RASH   Metformin   And Related    rash        Medication List     PAUSE taking these medications    chlorthalidone  25 MG tablet Wait to take this until your doctor or other care provider tells you to start again. Commonly known as: HYGROTON  Take 1 tablet (25 mg total) by mouth daily.       TAKE these medications    Accu-Chek Guide Test test strip Generic drug: glucose blood USE TO CHECK BLOOD SUGAR  DAILY   Accu-Chek Softclix Lancets lancets Check glucose level daily and as needed for diabetes type 2  (lancets for the soft clicks lancing device AccuChek meter)   acetaminophen  325 MG tablet Commonly known as: TYLENOL  Take 650 mg by mouth every 6 (six) hours as needed.   Adult Blood Pressure Cuff Lg Kit Blood pressure cuff size large for the arm to check blood pressure daily and as needed for HTN   albuterol  108 (90 Base) MCG/ACT inhaler Commonly known as: VENTOLIN  HFA Inhale 2 puffs into the lungs every 4 (four) hours as needed for wheezing or shortness of breath. Please give spacer if possible   amLODipine  5 MG tablet Commonly known as: NORVASC  TAKE 1 TABLET (5 MG TOTAL) BY MOUTH DAILY.   atorvastatin  10 MG tablet Commonly known as: LIPITOR TAKE 0.5 TABLETS (5 MG TOTAL) BY MOUTH EVERY OTHER DAY.   blood glucose meter kit and supplies To check glucose daily and prn for DM2 E11.9   EPIPEN IJ Inject as directed as needed. Reported on 12/05/2015   fluticasone  50 MCG/ACT nasal spray Commonly known as: FLONASE  PLACE 2 SPRAYS INTO THE NOSE DAILY. What changed:  how much to take how to take this when to take this reasons to take this additional instructions   folic acid  1 MG tablet Commonly known as: FOLVITE  Take 1 mg by mouth daily.   glipiZIDE  2.5 MG 24 hr tablet Commonly known as: GLUCOTROL  XL Take 1 tablet (2.5 mg total) by mouth daily with breakfast.   hydroxychloroquine 200 MG tablet Commonly known as: PLAQUENIL Take 200 mg by mouth daily.   loratadine  10 MG tablet Commonly known as: CLARITIN  Take 10 mg by mouth daily as needed for allergies.   methotrexate 2.5 MG tablet Commonly known as: RHEUMATREX Take 17.5 mg by mouth once a week. Caution:Chemotherapy. Protect from light. ( Tuesday of each week )   pantoprazole  40 MG tablet Commonly known as: PROTONIX  TAKE 1 TABLET BY MOUTH EVERY DAY   polyethylene glycol 17 g packet Commonly known as: MIRALAX  /  GLYCOLAX  Take 17 g by mouth daily.   potassium chloride  10 MEQ tablet Commonly known as: KLOR-CON  M TAKE 1 TABLET BY MOUTH TWICE A DAY   triamcinolone  cream 0.1 % Commonly known as: KENALOG  Apply 1 Application topically 2 (two) times daily as needed. On rash/affected area        Follow-up Information     Tower, Laine LABOR, MD Follow up in 1 week(s).   Specialties: Family Medicine, Radiology Contact information: [address] [place] [address] [phone]         [facility] Follow up.   Specialty: Home Health Services Why: Physical Therapy-office to call with visit times. Contact information: [address] [place] [address] [id number]                Allergies  Allergen Reactions   Bee Venom Anaphylaxis   Dicyclomine   Blurry vision   Humira [Adalimumab]     LIPS SWELLED AND RASH   Metformin  And Related     rash    You were cared for by a hospitalist during your hospital stay. If you have any questions about your discharge medications or the care you received while you were in the hospital after you are discharged, you can call the unit and asked to speak with the hospitalist on call if the hospitalist that took care of you is not available. Once you are discharged, your primary care physician will handle any further medical issues. Please note that no refills for any discharge medications will be authorized once you are discharged, as it is imperative that you return to your primary care physician (or establish a relationship with a primary care physician if you do not have one) for your aftercare needs so that they can reassess your need for medications and monitor your lab values.  You were cared for by a hospitalist during your hospital stay. If you have any questions about your discharge medications or the care you received while you were in the hospital after you are discharged, you can call the unit and asked to speak  with the hospitalist on call if the hospitalist that took care of you is not available. Once you are discharged, your primary care physician will handle any further medical issues. Please note that NO REFILLS for any discharge medications will be authorized once you are discharged, as it is imperative that you return to your primary care physician (or establish a relationship with a primary care physician if you do not have one) for your aftercare needs so that they can reassess your need for medications and monitor your lab values.  Please request your Prim.MD to go over all Hospital Tests and Procedure/Radiological results at the follow up, please get all Hospital records sent to your Prim MD by signing hospital release before you go home.  Get CBC, CMP, 2 view Chest X ray checked  by Primary MD during your next visit or SNF MD in 5-7 days ( we routinely change or add medications that can affect your baseline labs and fluid status, therefore we recommend that you get the mentioned basic workup next visit with your PCP, your PCP may decide not to get them or add new tests based on their clinical decision)  On your next visit with your primary care physician please Get Medicines reviewed and adjusted.  If you experience worsening of your admission symptoms, develop shortness of breath, life threatening emergency, suicidal or homicidal thoughts you must seek medical attention immediately by calling 911 or calling your MD immediately  if symptoms less severe.  You Must read complete instructions/literature along with all the possible adverse reactions/side effects for all the Medicines you take and that have been prescribed to you. Take any new Medicines after you have completely understood and accpet all the possible adverse reactions/side effects.   Do not drive, operate heavy machinery, perform activities at heights, swimming or participation in water activities or provide baby sitting services if your  were admitted for syncope or siezures until you have seen by Primary MD or a Neurologist and advised to do so again.  Do not drive when taking Pain medications.   Procedures/Studies: DG Chest 2 View Result Date: 08/02/2024 EXAM: 2 VIEW(S) XRAY OF THE CHEST 08/02/2024 06:17:00 AM COMPARISON: Comparison with portable chest 07/29/2024. CLINICAL HISTORY: [id number] Cardiac device in situ, other [phone]. Post pacemaker,hx bradycardia  Post pacemaker,hx bradycardia. FINDINGS: LINES, TUBES AND DEVICES: A left chest 2 lead pacing system has been inserted in the interval. One of the wires terminates in the right atrium and the other in the plane of the right ventricle. LUNGS AND PLEURA: The lungs are clear. No pneumothorax. No substantial pleural effusion. No focal pulmonary opacity. No pulmonary edema. HEART AND MEDIASTINUM: The mediastinum is normally outlined. There is aortic atherosclerosis. The cardiac size is normal. BONES AND SOFT TISSUES: There is partially visualized lower cervical acdf plating. Mild degenerative change of the spine. IMPRESSION: 1. No acute cardiopulmonary findings. 2. Left chest dual-lead pacemaker in situ. Electronically signed by: Francis Quam MD 08/02/2024 07:25 AM EDT RP Workstation: [id number]   EP PPM/ICD IMPLANT Result Date: 08/01/2024 SURGEON:  Soyla Norton, MD   PREPROCEDURE DIAGNOSIS:  second degree AV block Mobitz 1   POSTPROCEDURE DIAGNOSIS:  second degree AV block Mobitz 1    PROCEDURES:  1. Pacemaker implantation.   INTRODUCTION:  GEARLDINE LOONEY is a 83 y.o. female with a history of bradycardia who presents today for pacemaker implantation.  The patient reports intermittent episodes of dizziness over the past few months.  No reversible causes have been identified.  The patient therefore presents today for pacemaker implantation.   DESCRIPTION OF PROCEDURE:  Informed written consent was obtained, and  the patient was brought to the electrophysiology lab in a fasting state.   The patient required no sedation for the procedure today.  The patients left chest was prepped and draped in the usual sterile fashion by the EP lab staff. The skin overlying the left deltopectoral region was infiltrated with lidocaine  for local analgesia.  A 4-cm incision was made over the left deltopectoral region.  A left subcutaneous pacemaker pocket was fashioned using a combination of sharp and blunt dissection. Electrocautery was required to assure hemostasis.  RA/RV Lead Placement: The left axillary vein was therefore cannulated.  Through the left axillary vein, a Abbott Ultipace 1231-52  (serial number  [id number]) right atrial lead and an Abbott Ultipace 1231-65 (serial number  [id number]) right ventricular lead were advanced with fluoroscopic visualization into the right atrial appendage and left bundle area positions respectively.  Initial atrial lead P- waves measured 4.1 mV with impedance of 450 ohms and a threshold of 0.5 V at 0.5 msec.  Right ventricular lead R-waves measured 7.7 mV with an impedance of 610 ohms and a threshold of 0.5 V at 0.5 msec.  Both leads were secured to the pectoralis fascia using #2-0 silk over the suture sleeves. Device Placement:  The leads were then connected to an Abbott Assurity [id number]  (serial number  [id number] ) pacemaker.  The pocket was irrigated with copious gentamicin solution.  The pacemaker was then placed into the pocket.  The pocket was then closed in 3 layers with 2.0 and 3.0 V-Loc suture for the subcutaneous layers and 3.0 Vicryl suture for the subcuticular layers.  Steri-  Strips and a sterile dressing were then applied. EBL<8ml.  There were no early apparent complications.   CONCLUSIONS:  1. Successful implantation of a Abbott Assurity [id number] dual-chamber pacemaker for symptomatic bradycardia  2. No early apparent complications.   Will Norton, MD 08/01/2024 4:03 PM  ECHOCARDIOGRAM COMPLETE Result Date: 07/31/2024    ECHOCARDIOGRAM REPORT   Patient Name:    AMYE GREGO Date of Exam: 07/31/2024 Medical Rec #:  [id number]        Height:       67.0 in Accession #:  [id number]       Weight:       153.0 lb Date of Birth:  03-26-1941        BSA:          1.805 m Patient Age:    83 years         BP:           162/79 mmHg Patient Gender: F                HR:           49 bpm. Exam Location:  Inpatient Procedure: 2D Echo, Cardiac Doppler and Color Doppler (Both Spectral and Color            Flow Doppler were utilized during procedure). Indications:    Atrial Flutter I48.92  History:        Patient has no prior history of Echocardiogram examinations.                 Risk Factors:Hypertension, Diabetes and Former Smoker.  Sonographer:    Jayson Gaskins Referring Phys: [id number] CALLIE E GOODRICH IMPRESSIONS  1. Left ventricular ejection fraction, by estimation, is 65 to 70%. The left ventricle has normal function. The left ventricle has no regional wall motion abnormalities. There is mild concentric left ventricular hypertrophy. Left ventricular diastolic parameters are indeterminate.  2. Right ventricular systolic function is normal. The right ventricular size is normal. There is normal pulmonary artery systolic pressure. The estimated right ventricular systolic pressure is 32.6 mmHg.  3. Left atrial size was mildly dilated.  4. The mitral valve is grossly normal. Mild to moderate mitral valve regurgitation.  5. The aortic valve is tricuspid. There is moderate calcification of the aortic valve. Aortic valve regurgitation is mild. Aortic valve mean gradient measures 5.0 mmHg.  6. The inferior vena cava is normal in size with <50% respiratory variability, suggesting right atrial pressure of 8 mmHg. Comparison(s): No prior Echocardiogram. FINDINGS  Left Ventricle: Left ventricular ejection fraction, by estimation, is 65 to 70%. The left ventricle has normal function. The left ventricle has no regional wall motion abnormalities. The left ventricular internal cavity size was normal  in size. There is  mild concentric left ventricular hypertrophy. Left ventricular diastolic parameters are indeterminate. Right Ventricle: The right ventricular size is normal. No increase in right ventricular wall thickness. Right ventricular systolic function is normal. There is normal pulmonary artery systolic pressure. The tricuspid regurgitant velocity is 2.48 m/s, and  with an assumed right atrial pressure of 8 mmHg, the estimated right ventricular systolic pressure is 32.6 mmHg. Left Atrium: Left atrial size was mildly dilated. Right Atrium: Right atrial size was normal in size. Pericardium: There is no evidence of pericardial effusion. Mitral Valve: The mitral valve is grossly normal. Mild mitral annular calcification. Mild to moderate mitral valve regurgitation. Tricuspid Valve: The tricuspid valve is grossly normal. Tricuspid valve regurgitation is mild. Aortic Valve: The aortic valve is tricuspid. There is moderate calcification of the aortic valve. There is moderate aortic valve annular calcification. Aortic valve regurgitation is mild. Aortic valve mean gradient measures 5.0 mmHg. Aortic valve peak gradient measures 8.5 mmHg. Aortic valve area, by VTI measures 2.12 cm. Pulmonic Valve: The pulmonic valve was not well visualized. Pulmonic valve regurgitation is trivial. Aorta: The aortic root is normal in size and structure. Venous: The inferior vena cava is normal in size with less than 50% respiratory variability, suggesting right atrial pressure of 8 mmHg. IAS/Shunts: No atrial level  shunt detected by color flow Doppler. Additional Comments: 3D was performed not requiring image post processing on an independent workstation and was indeterminate.  LEFT VENTRICLE PLAX 2D LVIDd:         3.60 cm   Diastology LVIDs:         1.70 cm   LV e' medial:    8.38 cm/s LV PW:         1.10 cm   LV E/e' medial:  13.0 LV IVS:        1.00 cm   LV e' lateral:   9.03 cm/s LVOT diam:     1.70 cm   LV E/e' lateral: 12.1 LV  SV:         73 LV SV Index:   40 LVOT Area:     2.27 cm  RIGHT VENTRICLE RV S prime:     12.90 cm/s TAPSE (M-mode): 3.4 cm LEFT ATRIUM             Index        RIGHT ATRIUM           Index LA Vol (A2C):   31.5 ml 17.46 ml/m  RA Area:     13.10 cm LA Vol (A4C):   70.5 ml 39.07 ml/m  RA Volume:   26.40 ml  14.63 ml/m LA Biplane Vol: 48.2 ml 26.71 ml/m  AORTIC VALVE AV Area (Vmax):    2.18 cm AV Area (Vmean):   2.18 cm AV Area (VTI):     2.12 cm AV Vmax:           146.00 cm/s AV Vmean:          105.000 cm/s AV VTI:            0.342 m AV Peak Grad:      8.5 mmHg AV Mean Grad:      5.0 mmHg LVOT Vmax:         140.00 cm/s LVOT Vmean:        101.000 cm/s LVOT VTI:          0.320 m LVOT/AV VTI ratio: 0.94  AORTA Ao Root diam: 2.90 cm MITRAL VALVE                TRICUSPID VALVE MV Area (PHT): 3.27 cm     TR Peak grad:   24.6 mmHg MV Decel Time: 232 msec     TR Vmax:        248.00 cm/s MV E velocity: 109.00 cm/s MV A velocity: 112.00 cm/s  SHUNTS MV E/A ratio:  0.97         Systemic VTI:  0.32 m                             Systemic Diam: 1.70 cm Jayson Sierras MD Electronically signed by Jayson Sierras MD Signature Date/Time: 07/31/2024/1:11:58 PM    Final    CT ABDOMEN PELVIS W CONTRAST Result Date: 07/30/2024 CLINICAL DATA:  Worsening abdominal pain EXAM: CT ABDOMEN AND PELVIS WITH CONTRAST TECHNIQUE: Multidetector CT imaging of the abdomen and pelvis was performed using the standard protocol following bolus administration of intravenous contrast. RADIATION DOSE REDUCTION: This exam was performed according to the departmental dose-optimization program which includes automated exposure control, adjustment of the mA and/or kV according to patient size and/or use of iterative reconstruction technique. CONTRAST:  75mL OMNIPAQUE  IOHEXOL  350 MG/ML SOLN COMPARISON:  02/15/2021 FINDINGS: Lower chest:  Small right-sided pleural effusion is noted. No focal infiltrate is seen. Hepatobiliary: No focal liver abnormality  is seen. Status post cholecystectomy. No biliary dilatation. Pancreas: Unremarkable. No pancreatic ductal dilatation or surrounding inflammatory changes. Spleen: Normal in size without focal abnormality. Adrenals/Urinary Tract: Adrenal glands are within normal limits. Kidneys demonstrate a normal enhancement pattern bilaterally. No renal calculi or obstructive changes are seen. The bladder is well distended. Stomach/Bowel: No obstructive or inflammatory changes of the colon are seen. Mild diverticular changes noted. The appendix is within normal limits. Small bowel and stomach are unremarkable. Vascular/Lymphatic: Aortic atherosclerosis. No enlarged abdominal or pelvic lymph nodes. Reproductive: Uterus demonstrates calcified fibroids stable from the prior exam. No adnexal mass is seen. Other: None Musculoskeletal: Degenerative changes of lumbar spine are noted. 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 Tiny right pleural effusion. Electronically Signed   By: Oneil Devonshire M.D.   On: 07/30/2024 02:48   CT Head Wo Contrast Result Date: 07/29/2024 EXAM: CT HEAD WITHOUT CONTRAST 07/29/2024 05:47:57 PM TECHNIQUE: CT of the head was performed without the administration of intravenous contrast. Automated exposure control, iterative reconstruction, and/or weight based adjustment of the mA/kV was utilized to reduce the radiation dose to as low as reasonably achievable. COMPARISON: None available. CLINICAL HISTORY: Headache, new onset (Age >= 51y); HA HTN. FINDINGS: BRAIN AND VENTRICLES: Atherosclerotic calcifications are present in the cavernous carotid arteries bilaterally. No hyperdense vessel is present. No acute hemorrhage. No evidence of acute infarct. No hydrocephalus. No extra-axial collection. No mass effect or midline shift. ORBITS: Bilateral lens replacements are noted. The globes and orbits are otherwise within normal limits. SINUSES: Wall thickening  is present in the left maxillary sinus suggesting history of chronic disease. No active sinus disease is present. SOFT TISSUES AND SKULL: No acute soft tissue abnormality. No skull fracture. IMPRESSION: 1. No acute intracranial abnormality. 2. Atherosclerotic calcifications in the cavernous carotid arteries bilaterally. No hyperdense vessel. Electronically signed by: Lonni Necessary MD 07/29/2024 05:59 PM EDT RP Workstation: [id number]   DG Chest Portable 1 View Result Date: 07/29/2024 CLINICAL DATA:  Bradycardia and hypertension EXAM: PORTABLE CHEST 1 VIEW COMPARISON:  Chest radiograph dated 08/26/2023 FINDINGS: Normal lung volumes. No focal consolidations. No pleural effusion or pneumothorax. Enlarged cardiomediastinal silhouette is likely projectional. No acute osseous abnormality. IMPRESSION: No active disease. Electronically Signed   By: Limin  Xu M.D.   On: 07/29/2024 17:18     The results of significant diagnostics from this hospitalization (including imaging, microbiology, ancillary and laboratory) are listed below for reference.     Microbiology: No results found for this or any previous visit (from the past 240 hours).   Labs: BNP (last 3 results) No results for input(s): BNP in the last 8760 hours. Basic Metabolic Panel: Recent Labs  Lab 07/29/24 1822 07/30/24 0523 07/30/24 1103 07/31/24 0837 08/01/24 0504 08/02/24 0331  NA  --  137 136 143 137 138  K  --  3.4* 4.1 3.6 3.5 3.8  CL  --  106 105 105 104 105  CO2  --  20* 21* 21* 22 23  GLUCOSE  --  88 89 88 94 85  BUN  --  6* 5* 5* 6* <5*  CREATININE  --  0.65 0.68 0.71 0.74 0.67  CALCIUM   --  8.3* 8.4* 8.6* 8.4* 8.3*  MG 1.7  --  1.8 1.6* 1.7 2.1  PHOS  --   --  3.8  --   --   --  Liver Function Tests: Recent Labs  Lab 07/30/24 1103  AST 29  ALT 11  ALKPHOS 51  BILITOT 1.4*  PROT 6.9  ALBUMIN 3.1*   Recent Labs  Lab 07/30/24 1103  LIPASE 48   No results for input(s): AMMONIA in the last 168  hours. CBC: Recent Labs  Lab 07/29/24 1639 07/30/24 0523 07/31/24 0837 08/01/24 0504 08/02/24 0331  WBC 4.2 4.0 3.7* 3.1* 3.0*  HGB 11.9* 11.3* 12.0 11.7* 11.4*  HCT 37.4 35.4* 37.0 35.8* 35.5*  MCV 88.8 88.3 87.3 87.1 87.2  PLT 268 257 278 255 239   Cardiac Enzymes: No results for input(s): CKTOTAL, CKMB, CKMBINDEX, TROPONINI in the last 168 hours. BNP: Invalid input(s): POCBNP CBG: Recent Labs  Lab 08/01/24 0744 08/01/24 1200 08/01/24 1636 08/01/24 2106 08/02/24 0843  GLUCAP 89 83 76 120* 115*   D-Dimer No results for input(s): DDIMER in the last 72 hours. Hgb A1c No results for input(s): HGBA1C in the last 72 hours. Lipid Profile No results for input(s): CHOL, HDL, LDLCALC, TRIG, CHOLHDL, LDLDIRECT in the last 72 hours. Thyroid  function studies No results for input(s): TSH, T4TOTAL, T3FREE, THYROIDAB in the last 72 hours.  Invalid input(s): FREET3 Anemia work up No results for input(s): VITAMINB12, FOLATE, FERRITIN, TIBC, IRON, RETICCTPCT in the last 72 hours. Urinalysis    Component Value Date/Time   COLORURINE DARK YELLOW 04/11/2020 1115   APPEARANCEUR CLOUDY (A) 04/11/2020 1115   LABSPEC 1.025 04/11/2020 1115   PHURINE 5.0 04/11/2020 1115   GLUCOSEU NEGATIVE 04/11/2020 1115   HGBUR NEGATIVE 04/11/2020 1115   BILIRUBINUR 2 mg/dL 88/92/7975 8994   KETONESUR TRACE (A) 04/11/2020 1115   PROTEINUR Positive (A) 08/27/2023 1005   PROTEINUR NEGATIVE 04/11/2020 1115   UROBILINOGEN 1.0 08/27/2023 1005   NITRITE Negative 08/27/2023 1005   NITRITE NEGATIVE 01/21/2017 1214   LEUKOCYTESUR Large (3+) (A) 08/27/2023 1005   Sepsis Labs Recent Labs  Lab 07/30/24 0523 07/31/24 0837 08/01/24 0504 08/02/24 0331  WBC 4.0 3.7* 3.1* 3.0*   Microbiology No results found for this or any previous visit (from the past 240 hours).   Time coordinating discharge:  I have spent 35 minutes face to face with the patient and  on the ward discussing the patients care, assessment, plan and disposition with other care givers. >50% of the time was devoted counseling the patient about the risks and benefits of treatment/Discharge disposition and coordinating care.   SIGNED:   Burgess JAYSON Dare, MD  [facility] 08/02/2024, 12:40 PM   If 7PM-7AM, please contact night-coverage

## 2024-08-02 NOTE — Evaluation (Signed)
 Occupational Therapy Evaluation Patient Details Name: [name] MRN: [id number] DOB: 07/12/1941 Today's Date: 08/02/2024   History of Present Illness   Pt is 83 yo female who presents on 07/29/24 with HTN, bradycardia, chest pressure, and HA. New dx of a-flutter. PMH: DMII, dysrhythmia s/p ablation, HTN, HLD, GERD, RA.     Clinical Impressions Pt has assist for mobility at baseline to get OOB and assist for some ADLs, pt sponge bathes at baseline. Pt currently needs up to min A for ADLs and CGA for transfers with RW. Reviewed PPM precaution handout with pt and educated on compensatory strategies for UB ADLs and sling wear (handout provided). Pt able to demo during session, needs min cues for precautions throughout. Pt presenting with impairments listed below, will follow acutely. Recommend HHOT at d/c.      If plan is discharge home, recommend the following:   A little help with walking and/or transfers;A little help with bathing/dressing/bathroom;Assistance with cooking/housework;Assist for transportation;Help with stairs or ramp for entrance     Functional Status Assessment   Patient has had a recent decline in their functional status and demonstrates the ability to make significant improvements in function in a reasonable and predictable amount of time.     Equipment Recommendations   None recommended by OT     Recommendations for Other Services   PT consult     Precautions/Restrictions   Precautions Precautions: ICD/Pacemaker;Fall Recall of Precautions/Restrictions: Intact Precaution/Restrictions Comments: handout given; LUE sling Restrictions Weight Bearing Restrictions Per Provider Order: No     Mobility Bed Mobility               General bed mobility comments: EOB upon arrival, educated on not rolling to L shoulder    Transfers Overall transfer level: Needs assistance Equipment used: Rolling walker (2 wheels) Transfers: Sit to/from  Stand Sit to Stand: Contact guard assist                  Balance Overall balance assessment: History of Falls, Mild deficits observed, not formally tested                                         ADL either performed or assessed with clinical judgement   ADL Overall ADL's : Needs assistance/impaired Eating/Feeding: Set up   Grooming: Set up   Upper Body Bathing: Minimal assistance   Lower Body Bathing: Minimal assistance   Upper Body Dressing : Minimal assistance   Lower Body Dressing: Minimal assistance   Toilet Transfer: Contact guard assist;Ambulation;Rolling walker (2 wheels)   Toileting- Clothing Manipulation and Hygiene: Contact guard assist       Functional mobility during ADLs: Contact guard assist;Rolling walker (2 wheels)       Vision   Vision Assessment?: No apparent visual deficits     Perception Perception: Not tested       Praxis Praxis: Not tested       Pertinent Vitals/Pain Pain Assessment Pain Assessment: Faces Pain Score: 6  Faces Pain Scale: Hurts even more Pain Location: knees (arthritic pain) Pain Descriptors / Indicators: Aching Pain Intervention(s): Limited activity within patient's tolerance, Monitored during session, Repositioned     Extremity/Trunk Assessment Upper Extremity Assessment Upper Extremity Assessment: Generalized weakness   Lower Extremity Assessment Lower Extremity Assessment: Defer to PT evaluation   Cervical / Trunk Assessment Cervical / Trunk Assessment: Kyphotic   Communication  Communication Communication: No apparent difficulties   Cognition Arousal: Alert Behavior During Therapy: WFL for tasks assessed/performed Cognition: No apparent impairments                               Following commands: Intact       Cueing  General Comments   Cueing Techniques: Verbal cues  VSS   Exercises     Shoulder Instructions      Home Living Family/patient expects to  be discharged to:: Private residence Living Arrangements: Spouse/significant other Available Help at Discharge: Family;Available PRN/intermittently Type of Home: House Home Access: Level entry     Home Layout: One level     Bathroom Shower/Tub: Tub/shower unit;Sponge bathes at baseline         Home Equipment: Agricultural consultant (2 wheels);Cane - single point   Additional Comments: husband leaves occasionally to mow yards      Prior Functioning/Environment Prior Level of Function : Needs assist             Mobility Comments: husband helps her get OOB and as needed due to her RA. Usually ambulates with cane ADLs Comments: husband assists with sponge bathing as needed but she can usually do on her own    OT Problem List: Decreased strength;Decreased range of motion;Decreased activity tolerance;Impaired balance (sitting and/or standing);Decreased knowledge of precautions   OT Treatment/Interventions: Self-care/ADL training;Therapeutic exercise;Energy conservation;DME and/or AE instruction;Therapeutic activities;Patient/family education;Balance training;Cognitive remediation/compensation      OT Goals(Current goals can be found in the care plan section)   Acute Rehab OT Goals Patient Stated Goal: none stated OT Goal Formulation: With patient Time For Goal Achievement: 08/16/24 Potential to Achieve Goals: Good ADL Goals Pt Will Perform Upper Body Dressing: with modified independence;sitting Pt Will Perform Lower Body Dressing: with modified independence;sitting/lateral leans;sit to/from stand Pt Will Transfer to Toilet: with modified independence;ambulating;regular height toilet Additional ADL Goal #1: pt will require min cues for PPM precautions during ADls and functional mobility tasks   OT Frequency:  Min 2X/week    Co-evaluation              AM-PAC OT 6 Clicks Daily Activity     Outcome Measure Help from another person eating meals?: A Little Help from  another person taking care of personal grooming?: A Little Help from another person toileting, which includes using toliet, bedpan, or urinal?: A Little Help from another person bathing (including washing, rinsing, drying)?: A Little Help from another person to put on and taking off regular upper body clothing?: A Little Help from another person to put on and taking off regular lower body clothing?: A Little 6 Click Score: 18   End of Session Equipment Utilized During Treatment: Rolling walker (2 wheels);Other (comment) Nurse Communication: Mobility status  Activity Tolerance: Patient tolerated treatment well Patient left: in bed;with call bell/phone within reach;with family/visitor present (seated EOB)  OT Visit Diagnosis: Unsteadiness on feet (R26.81);Other abnormalities of gait and mobility (R26.89);Muscle weakness (generalized) (M62.81)                Time: 8952-8889 OT Time Calculation (min): 23 min Charges:  OT General Charges $OT Visit: 1 Visit OT Evaluation $OT Eval Low Complexity: 1 Low OT Treatments $Self Care/Home Management : 8-22 mins  Edythe Riches K, OTD, OTR/L SecureChat Preferred Acute Rehab [phone]  Laneta POUR Koonce 08/02/2024, 1:39 PM

## 2024-08-02 NOTE — Care Management Important Message (Deleted)
 Important Message  Patient Details  Name: [name] MRN: [id number] Date of Birth: 07-Jan-1941   Important Message Given:  Yes - Medicare IM     Vonzell Arrie Sharps 08/02/2024, 10:04 AM

## 2024-08-02 NOTE — Evaluation (Signed)
 Physical Therapy Evaluation Patient Details Name: [name] MRN: [id number] DOB: 1941/04/12 Today's Date: 08/02/2024  History of Present Illness  Pt is 83 yo female who presents on 07/29/24 with HTN, bradycardia, chest pressure, and HA. New dx of a-flutter. PMH: DMII, dysrhythmia s/p ablation, HTN, HLD, GERD, RA.  Clinical Impression  Patient evaluated by Physical Therapy with no further acute PT needs identified. All education has been completed and the patient has no further questions. Pt given handout with precautions for pacer. Pt with good understanding. Needed min HHA to come to EOB which is her baseline and husband assists her at home. Pt mobility limited by her RA. Pt ambulated 100' with RW and supervision. Pt has been receiving HHPT, recommend she continue this at d/c.  See below for any follow-up Physical Therapy or equipment needs. PT is signing off. Thank you for this referral.         If plan is discharge home, recommend the following: A little help with walking and/or transfers;A little help with bathing/dressing/bathroom;Assistance with cooking/housework;Assist for transportation   Can travel by private vehicle        Equipment Recommendations None recommended by PT  Recommendations for Other Services       Functional Status Assessment Patient has had a recent decline in their functional status and demonstrates the ability to make significant improvements in function in a reasonable and predictable amount of time.     Precautions / Restrictions Precautions Precautions: ICD/Pacemaker;Fall Recall of Precautions/Restrictions: Intact Precaution/Restrictions Comments: handout given Restrictions Weight Bearing Restrictions Per Provider Order: No      Mobility  Bed Mobility Overal bed mobility: Needs Assistance Bed Mobility: Supine to Sit     Supine to sit: Min assist     General bed mobility comments: min HHA to come to EOB without rolling onto or using  LUE    Transfers Overall transfer level: Needs assistance Equipment used: Rolling walker (2 wheels) Transfers: Sit to/from Stand Sit to Stand: Supervision           General transfer comment: slow moving but no physical assist needed    Ambulation/Gait Ambulation/Gait assistance: Supervision Gait Distance (Feet): 100 Feet Assistive device: Rolling walker (2 wheels) Gait Pattern/deviations: Step-through pattern (decreased step height) Gait velocity: decreased Gait velocity interpretation: 1.31 - 2.62 ft/sec, indicative of limited community ambulator   General Gait Details: pt able to ambulate without putting significant pressure LUE. Pt has fall risk, though none recently. Instructed to use RW over [facility] right now for precaution  Stairs            Wheelchair Mobility     Tilt Bed    Modified Rankin (Stroke Patients Only)       Balance Overall balance assessment: History of Falls, Mild deficits observed, not formally tested                                           Pertinent Vitals/Pain Pain Assessment Pain Assessment: Faces Faces Pain Scale: Hurts even more Pain Location: knees Pain Descriptors / Indicators: Aching Pain Intervention(s): Limited activity within patient's tolerance, Monitored during session    Home Living Family/patient expects to be discharged to:: Private residence Living Arrangements: Spouse/significant other Available Help at Discharge: Family;Available PRN/intermittently Type of Home: House Home Access: Level entry       Home Layout: One level Home Equipment: Agricultural consultant (2  wheels);Cane - single point Additional Comments: husband leaves occasionally to mow yards    Prior Function Prior Level of Function : Needs assist             Mobility Comments: husband helps her get OOB and as needed due to her RA. Usually ambulates with cane ADLs Comments: husband assists with sponge bathing as needed but she can  usually do on her own     Extremity/Trunk Assessment   Upper Extremity Assessment Upper Extremity Assessment: Defer to OT evaluation    Lower Extremity Assessment Lower Extremity Assessment: Generalized weakness    Cervical / Trunk Assessment Cervical / Trunk Assessment: Kyphotic  Communication   Communication Communication: No apparent difficulties    Cognition Arousal: Alert Behavior During Therapy: WFL for tasks assessed/performed   PT - Cognitive impairments: No apparent impairments                         Following commands: Intact       Cueing Cueing Techniques: Verbal cues     General Comments General comments (skin integrity, edema, etc.): HR 81 bpm with ambulation    Exercises     Assessment/Plan    PT Assessment All further PT needs can be met in the next venue of care (pt already receiving HHPT)  PT Problem List Decreased strength;Decreased range of motion;Decreased activity tolerance;Decreased balance;Decreased mobility;Pain       PT Treatment Interventions      PT Goals (Current goals can be found in the Care Plan section)  Acute Rehab PT Goals Patient Stated Goal: return home PT Goal Formulation: All assessment and education complete, DC therapy    Frequency       Co-evaluation               AM-PAC PT 6 Clicks Mobility  Outcome Measure Help needed turning from your back to your side while in a flat bed without using bedrails?: A Little Help needed moving from lying on your back to sitting on the side of a flat bed without using bedrails?: A Little Help needed moving to and from a bed to a chair (including a wheelchair)?: A Little Help needed standing up from a chair using your arms (e.g., wheelchair or bedside chair)?: A Little Help needed to walk in hospital room?: A Little Help needed climbing 3-5 steps with a railing? : A Lot 6 Click Score: 17    End of Session   Activity Tolerance: Patient tolerated treatment  well Patient left: in bed;with call bell/phone within reach;with family/visitor present Nurse Communication: Mobility status PT Visit Diagnosis: Muscle weakness (generalized) (M62.81);History of falling (Z91.81);Pain Pain - part of body: Knee    Time: 1024-1049 PT Time Calculation (min) (ACUTE ONLY): 25 min   Charges:   PT Evaluation $PT Eval Low Complexity: 1 Low PT Treatments $Gait Training: 8-22 mins PT General Charges $$ ACUTE PT VISIT: 1 Visit         Richerd Lipoma, PT  Acute Rehab Services Secure chat preferred Office [phone]   Richerd CROME Alvah Gilder 08/02/2024, 10:59 AM

## 2024-08-03 ENCOUNTER — Telehealth: Payer: Self-pay

## 2024-08-03 ENCOUNTER — Ambulatory Visit: Admitting: Family Medicine

## 2024-08-03 ENCOUNTER — Telehealth: Payer: Self-pay | Admitting: *Deleted

## 2024-08-03 LAB — CALCIUM, IONIZED: Calcium, Ionized, Serum: 4.7 mg/dL (ref 4.5–5.6)

## 2024-08-03 NOTE — Telephone Encounter (Signed)
 Copied from CRM [id number]. 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 Call back # for angela is [phone]

## 2024-08-03 NOTE — Transitions of Care (Post Inpatient/ED Visit) (Signed)
 08/03/2024  Name: [name] MRN: [id number] DOB: 18-Sep-1941  Today's TOC FU Call Status: Today's TOC FU Call Status:: Successful TOC FU Call Completed TOC FU Call Complete Date: 08/03/24 Patient's Name and Date of Birth confirmed.  Transition Care Management Follow-up Telephone Call Date of Discharge: 08/02/24 Discharge Facility: [facility]) Type of Discharge: Inpatient Admission Primary Inpatient Discharge Diagnosis:: bradycardia/ SVT How have you been since you were released from the hospital?: Same Any questions or concerns?: Yes Patient Questions/Concerns:: patient states she is having pain in her left shoulder on the side she had the pacemaker placed. Patient Questions/Concerns Addressed: Notified Provider of Patient Questions/Concerns  Items Reviewed: Did you receive and understand the discharge instructions provided?: Yes Any new allergies since your discharge?: No Dietary orders reviewed?: Yes Type of Diet Ordered:: diabetic Do you have support at home?: Yes People in Home [RPT]: spouse Name of Support/Comfort Primary Source: charlie Eisenhuth  Medications Reviewed Today: Medications Reviewed Today     Reviewed by Puneet Selden E, RN (Registered Nurse) on 08/03/24 at 1326  Med List Status: <None>   Medication Order Taking? Sig Documenting Provider Last Dose Status Informant  ACCU-CHEK GUIDE TEST test strip [id number]  USE TO CHECK BLOOD SUGAR DAILY Tower, Laine LABOR, MD  Active Spouse/Significant Other, Pharmacy Records  Accu-Chek Softclix Lancets lancets [id number]  Check glucose level daily and as needed for diabetes type 2  (lancets for the soft clicks lancing device AccuChek meter) Tower, Laine LABOR, MD  Active Spouse/Significant Other, Pharmacy Records  acetaminophen  (TYLENOL ) 325 MG tablet [id number] Yes Take 650 mg by mouth every 6 (six) hours as needed. [provider]  Active Spouse/Significant Other, Pharmacy Records  albuterol  (PROVENTIL  HFA;VENTOLIN   HFA) 108 (90 Base) MCG/ACT inhaler [id number] Yes Inhale 2 puffs into the lungs every 4 (four) hours as needed for wheezing or shortness of breath. Please give spacer if possible Tower, Laine LABOR, MD  Active Spouse/Significant Other, Pharmacy Records  amLODipine  (NORVASC ) 5 MG tablet [id number] Yes TAKE 1 TABLET (5 MG TOTAL) BY MOUTH DAILY. Tower, Laine LABOR, MD  Active Spouse/Significant Other, Pharmacy Records  atorvastatin  (LIPITOR) 10 MG tablet [id number] Yes TAKE 0.5 TABLETS (5 MG TOTAL) BY MOUTH EVERY OTHER DAY. Tower, Laine LABOR, MD  Active Spouse/Significant Other, Pharmacy Records           Med Note (WHITE, DONETA RAMAN   Fri Jul 29, 2024 10:06 PM) Taken occasionally  blood glucose meter kit and supplies [id number] Yes To check glucose daily and prn for DM2 E11.9 Tower, Laine LABOR, MD  Active Spouse/Significant Other, Pharmacy Records  Blood Pressure Monitoring (ADULT BLOOD PRESSURE CUFF LG) KIT [id number] Yes Blood pressure cuff size large for the arm to check blood pressure daily and as needed for HTN Tower, Laine LABOR, MD  Active Spouse/Significant Other, Pharmacy Records  chlorthalidone  (HYGROTON ) 25 MG tablet [id number]  Take 1 tablet (25 mg total) by mouth daily.  Patient not taking: Reported on 07/29/2024   Tower, Laine LABOR, MD  Active Spouse/Significant Other, Pharmacy Records           Med Note [facility], DONETA RAMAN   Fri Jul 29, 2024 10:14 PM) Spouse at the bedside doesn't recall the patient ever taking this medication.  EPINEPHrine (EPIPEN IJ) [id number]  Inject as directed as needed. Reported on 12/05/2015  Patient not taking: Reported on 08/03/2024   [provider]  Active Spouse/Significant Other, Pharmacy Records  fluticasone  (FLONASE ) 50 MCG/ACT nasal spray [id number] Yes PLACE 2 SPRAYS  INTO THE NOSE DAILY.  Patient taking differently: Place 2 sprays into both nostrils daily as needed for allergies or rhinitis.   Tower, Laine LABOR, MD  Active Spouse/Significant Other, Pharmacy Records           Med  Note (WHITE, DONETA RAMAN   Fri Jul 29, 2024 10:08 PM)    folic acid  (FOLVITE ) 1 MG tablet [id number] Yes Take 1 mg by mouth daily. [provider]  Active Spouse/Significant Other, Pharmacy Records  glipiZIDE  (GLUCOTROL  XL) 2.5 MG 24 hr tablet [id number] Yes Take 1 tablet (2.5 mg total) by mouth daily with breakfast. Tower, Laine LABOR, MD  Active Spouse/Significant Other, Pharmacy Records  hydroxychloroquine (PLAQUENIL) 200 MG tablet [id number] Yes Take 200 mg by mouth daily. [provider]  Active Spouse/Significant Other, Pharmacy Records  loratadine  (CLARITIN ) 10 MG tablet [id number] Yes Take 10 mg by mouth daily as needed for allergies. [provider]  Active Spouse/Significant Other, Pharmacy Records  methotrexate (RHEUMATREX) 2.5 MG tablet [id number] Yes Take 17.5 mg by mouth once a week. Caution:Chemotherapy. Protect from light. ( Tuesday of each week ) [provider]  Active Spouse/Significant Other, Pharmacy Records  pantoprazole  (PROTONIX ) 40 MG tablet [id number] Yes TAKE 1 TABLET BY MOUTH EVERY DAY Tower, Laine LABOR, MD  Active Spouse/Significant Other, Pharmacy Records  polyethylene glycol (MIRALAX  / GLYCOLAX ) packet [id number]  Take 17 g by mouth daily.  Patient not taking: Reported on 08/03/2024   [provider]  Active Spouse/Significant Other, Pharmacy Records  potassium chloride  (KLOR-CON  M) 10 MEQ tablet [id number]  TAKE 1 TABLET BY MOUTH TWICE A DAY  Patient not taking: Reported on 08/03/2024   Randeen Laine LABOR, MD  Active Spouse/Significant Other, Pharmacy Records  triamcinolone  cream (KENALOG ) 0.1 % [id number] Yes Apply 1 Application topically 2 (two) times daily as needed. On rash/affected area Tower, Laine LABOR, MD  Active Spouse/Significant Other, Pharmacy Records            Home Care and Equipment/Supplies: Were Home Health Services Ordered?: Yes Name of Home Health Agency:: [facility] home health Has Agency set up a time to come to your home?:  Yes First Home Health Visit Date: 07/28/24 Any new equipment or medical supplies ordered?: No  Functional Questionnaire: Do you need assistance with bathing/showering or dressing?: Yes Do you need assistance with meal preparation?: Yes Do you need assistance with eating?: No Do you have difficulty maintaining continence: No Do you need assistance with getting out of bed/getting out of a chair/moving?: Yes Do you have difficulty managing or taking your medications?: No  Follow up appointments reviewed: PCP Follow-up appointment confirmed?: Yes Date of PCP follow-up appointment?: 08/09/24 Follow-up Provider: Dr. Laine [facility] Follow-up appointment confirmed?: No Reason Specialist Follow-Up Not Confirmed: Patient has Specialist Provider Number and will Call for Appointment Do you need transportation to your follow-up appointment?: No Do you understand care options if your condition(s) worsen?: Yes-patient verbalized understanding  SDOH Interventions Today    Flowsheet Row Most Recent Value  SDOH Interventions   Food Insecurity Interventions Intervention Not Indicated  Housing Interventions Intervention Not Indicated  Transportation Interventions Intervention Not Indicated  Utilities Interventions Intervention Not Indicated   Discussed and offered 30 day TOC program.  Patient verbally agreed.  The patient has been provided with contact information for the care management team and has been advised to call with any health -related questions or concerns.  The patient verbalized understanding with current plan of care.  The patient is directed to  their insurance card regarding availability of benefits coverage.    Arvin Seip RN, BSN, CCM [employer], Population Health Case Manager Phone: [phone]

## 2024-08-03 NOTE — Telephone Encounter (Signed)
 VO's given to Angela

## 2024-08-03 NOTE — Telephone Encounter (Signed)
Please ok that verbal order  

## 2024-08-03 NOTE — Patient Instructions (Signed)
 Visit Information  Thank you for taking time to visit with me today. Please don't hesitate to contact me if I can be of assistance to you before our next scheduled telephone appointment.  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 and status post defibrillator placement  Goal:  Over the next 30 days, the patient will not experience hospital readmission  Interventions:  Transitions of Care: Doctor Visits  - discussed the importance of doctor visits Arranged PCP follow-up within 7 days (Care Guide Scheduled) Post discharge activity limitations prescribed by provider reviewed Post-op wound/incision care reviewed with patient/caregiver Reviewed Signs and symptoms of infection Provided patient contact phone number cardiology office Advised to call cardiology office to arrange follow up appointment and report ongoing left shoulder pain Advised to take medications as prescribed.  Advised to monitor incision site for redness, drainage, swelling, skin being hot to touch or fever.  Notify provider immediately for these symptoms Do not lift your arm above shoulder height for 1 week after your procedure. After 7 days, you may progress as below.  You should remove your sling 24 hours after your procedure, unless otherwise instructed by your provider.      Tuesday August 09, 2024  Wednesday August 10, 2024 Thursday August 11, 2024 Friday August 12, 2024    Do not lift, push, pull, or carry anything over 10 pounds with the affected arm until 6 weeks (Tuesday September 13, 2024 ) after your procedure.    You may drive AFTER your wound check, unless you have been told otherwise by your provider.     Patient Self Care Activities:   Attend all scheduled provider appointments Call pharmacy for medication refills 3-7 days in advance of running out of medications Call provider office for new concerns or questions  Notify RN Care Manager of TOC call rescheduling needs Participate in Transition of Care Program/Attend TOC scheduled calls Take medications as prescribed   Do not lift your arm above shoulder height for 1 week after your procedure. After 7 days, you may progress as below.  You should remove your sling 24 hours after your procedure, unless otherwise instructed by your provider.      Tuesday August 09, 2024  Wednesday August 10, 2024 Thursday August 11, 2024 Friday August 12, 2024    Do not lift, push, pull, or carry anything over 10 pounds with the affected arm until 6 weeks (Tuesday September 13, 2024 ) after your procedure.    You may drive AFTER your wound check, unless you have been told otherwise by your provider.     Plan:  Telephone follow up appointment with care management team member scheduled for:  08/10/24 at 1 pm The patient has been provided with contact information for the care management team and has been advised to call with any health related questions or concerns.         Patient verbalizes understanding of instructions and care plan provided today and agrees to view in MyChart. Active MyChart status and patient understanding of how to access instructions and care plan via MyChart confirmed with patient.     The patient has been provided  with contact information for the care management team and has been advised to call with any health related questions or concerns.   Please call the care guide team at [phone] if you need to cancel or reschedule your appointment.   Please call the Suicide and Crisis Lifeline: 988 call the [place]  National Suicide Prevention Lifeline: (563)384-7097 or TTY: 507-504-7229 TTY 971 677 6393) to talk to a trained counselor call 1-800-273-TALK (toll  free, 24 hour hotline) if you are experiencing a Mental Health or Behavioral Health Crisis or need someone to talk to.  Arvin Seip RN, BSN, CCM [employer], Population Health Case Manager Phone: [phone]

## 2024-08-04 ENCOUNTER — Telehealth: Payer: Self-pay | Admitting: Family Medicine

## 2024-08-04 ENCOUNTER — Telehealth: Payer: Self-pay

## 2024-08-04 NOTE — Telephone Encounter (Unsigned)
 Copied from CRM #[id number]. Topic: General - Other >> Aug 04, 2024 12:41 PM Alfonso HERO wrote: Reason for CRM: Daughter (A Delcid)  Called in requesting a doctors note for work due to her FMLA  paperwork not being completed yet for caring for the patient. To call once note is avail for pick up...SABRASABRA [phone]

## 2024-08-04 NOTE — Telephone Encounter (Signed)
 Completed forms received and faxed to [employer] for Pt's daughter as caregiver.  Copy sent to scan.  Copy will be picked up at the front desk by daughter.

## 2024-08-05 NOTE — Telephone Encounter (Signed)
 Note placed in pt's chart, see other phone note

## 2024-08-05 NOTE — Telephone Encounter (Signed)
 I did her forms- were they sent?  What does she need the note to say Thanks

## 2024-08-09 ENCOUNTER — Ambulatory Visit: Payer: Self-pay | Admitting: Family Medicine

## 2024-08-09 ENCOUNTER — Ambulatory Visit (INDEPENDENT_AMBULATORY_CARE_PROVIDER_SITE_OTHER): Admitting: Family Medicine

## 2024-08-09 ENCOUNTER — Encounter: Payer: Self-pay | Admitting: Family Medicine

## 2024-08-09 VITALS — BP 122/65 | HR 88 | Temp 98.3°F | Ht 67.0 in | Wt 155.5 lb

## 2024-08-09 DIAGNOSIS — R001 Bradycardia, unspecified: Secondary | ICD-10-CM

## 2024-08-09 DIAGNOSIS — Z7984 Long term (current) use of oral hypoglycemic drugs: Secondary | ICD-10-CM

## 2024-08-09 DIAGNOSIS — I340 Nonrheumatic mitral (valve) insufficiency: Secondary | ICD-10-CM | POA: Diagnosis not present

## 2024-08-09 DIAGNOSIS — E876 Hypokalemia: Secondary | ICD-10-CM

## 2024-08-09 DIAGNOSIS — R011 Cardiac murmur, unspecified: Secondary | ICD-10-CM | POA: Diagnosis not present

## 2024-08-09 DIAGNOSIS — Z950 Presence of cardiac pacemaker: Secondary | ICD-10-CM | POA: Insufficient documentation

## 2024-08-09 DIAGNOSIS — I10 Essential (primary) hypertension: Secondary | ICD-10-CM

## 2024-08-09 DIAGNOSIS — E119 Type 2 diabetes mellitus without complications: Secondary | ICD-10-CM | POA: Diagnosis not present

## 2024-08-09 DIAGNOSIS — Z95811 Presence of heart assist device: Secondary | ICD-10-CM | POA: Diagnosis not present

## 2024-08-09 LAB — BASIC METABOLIC PANEL WITH GFR
BUN: 10 mg/dL (ref 6–23)
CO2: 26 meq/L (ref 19–32)
Calcium: 9.2 mg/dL (ref 8.4–10.5)
Chloride: 103 meq/L (ref 96–112)
Creatinine, Ser: 0.64 mg/dL (ref 0.40–1.20)
GFR: 82.39 mL/min (ref >60.00)
Glucose, Bld: 91 mg/dL (ref 70–99)
Potassium: 4.1 meq/L (ref 3.5–5.1)
Sodium: 137 meq/L (ref 135–145)

## 2024-08-09 LAB — CBC WITH DIFFERENTIAL/PLATELET
Basophils Absolute: 0 K/uL (ref 0.0–0.1)
Basophils Relative: 0.4 % (ref 0.0–3.0)
Eosinophils Absolute: 0.1 K/uL (ref 0.0–0.7)
Eosinophils Relative: 2.9 % (ref 0.0–5.0)
HCT: 36.5 % (ref 36.0–46.0)
Hemoglobin: 12 g/dL (ref 12.0–15.0)
Lymphocytes Relative: 22.5 % (ref 12.0–46.0)
Lymphs Abs: 1 K/uL (ref 0.7–4.0)
MCHC: 32.9 g/dL (ref 30.0–36.0)
MCV: 85.9 fl (ref 78.0–100.0)
Monocytes Absolute: 0.1 K/uL (ref 0.1–1.0)
Monocytes Relative: 1.4 % — ABNORMAL LOW (ref 3.0–12.0)
Neutro Abs: 3.3 K/uL (ref 1.4–7.7)
Neutrophils Relative %: 72.8 % (ref 43.0–77.0)
Platelets: 291 K/uL (ref 150.0–400.0)
RBC: 4.25 Mil/uL (ref 3.87–5.11)
RDW: 16.7 % — ABNORMAL HIGH (ref 11.5–15.5)
WBC: 4.5 K/uL (ref 4.0–10.5)

## 2024-08-09 LAB — HEMOGLOBIN A1C: Hgb A1c MFr Bld: 5.4 % (ref 4.6–6.5)

## 2024-08-09 NOTE — Assessment & Plan Note (Signed)
 Moderate MR on echo Not symptomatic

## 2024-08-09 NOTE — Assessment & Plan Note (Signed)
 Reviewed echo No symptoms

## 2024-08-09 NOTE — Assessment & Plan Note (Signed)
 Off thiazide and K now  Lab today

## 2024-08-09 NOTE — Assessment & Plan Note (Signed)
 From prednisone  Asked pt to call with her dose of prednisone  , is not on med list A1c today

## 2024-08-09 NOTE — Assessment & Plan Note (Signed)
 BP: 122/65  Blood pressure seems adequately controlled off chlorthalidone  now (holding this and K) On amlodipine  5 mg daily  With pacemaker  Reviewed hospital records, lab results and studies in detail   Bmet today Continues to have daughter organize and admin medications

## 2024-08-09 NOTE — Patient Instructions (Addendum)
 Call or message us  and let me know how much prednisone  you take daily (then I will add it to your medicine list)   I took the diuretic and the potassium off your medicine list   Labs today   Check your updated medicine list with your pill box when you get home and make sure you have what you need

## 2024-08-09 NOTE — Assessment & Plan Note (Signed)
 Pacemaker Doing well

## 2024-08-09 NOTE — Progress Notes (Signed)
 Subjective:    Patient ID: [name], female    DOB: 1941-04-21, 83 y.o.   MRN: [id number]  HPI  Wt Readings from Last 3 Encounters:  08/09/24 155 lb 8 oz (70.5 kg)  07/29/24 153 lb (69.4 kg)  07/22/24 153 lb (69.4 kg)   24.35 kg/m  Vitals:   08/09/24 1144 08/09/24 1210  BP: 130/78 122/65  Pulse: 88   Temp: 98.3 F (36.8 C)   SpO2: 98%     Pt presents for follow up of hospitalization for  Bradycardia Pacemaker    Hosp from10/10 to 10/14  Presented to ER with elevated blood pressure and pulse in the 30s   Per d/c summary Bradycardia, new diagnosis of SVT? Resolved.  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   - Echocardiogram-preserved EF with moderate MR - dc hep for now. No flutter on tele per EP - Cardiology/EP following. Dual Chamber pacemaker implanted 10/13   Has appointment with cardiology on 08/16/24  Still some soreness of the pacer site  No palpitations  No shortness of breath     Abdominal pain, nonspecific -CT abdomen pelvis is negative.  LFTs and lipase are normal   Hypertensive urgency -Initial systolic blood pressures greater than 200.  Amlodipine  and hydralazine have been initiated.  IV as needed   Chest pressure  Poor historian, troponins are flat.  Echocardiogram ordered    Diabetes mellitus-A1c 6.1- 12/02/23. From chronic prednisone   -Holding home med.  Sliding scale and Accu-Cheks-in hospital  Now back on glipiide xl 2.5 mg daily   Appetite is better     Lab Results  Component Value Date   HGBA1C 6.1 12/02/2023   HGBA1C 6.8 (H) 05/12/2023   HGBA1C 7.2 (H) 02/06/2023      Rheumatoid arthritis -Pain control.  On Plaquenil   DVT prophylaxis: SCDs Start: 08/01/24 1625Heparin drip    Code Status: Full Code Family Communication: Spouse is present at bedside Dc once cleared by EP  HTN bp is stable today  No cp or palpitations or headaches or edema  No side effects to medicines   BP Readings from Last 3 Encounters:  08/09/24 122/65  08/02/24 121/74  06/03/24 136/84     Holding chlorthalidone  and klor con (holding since discharge)  Taking amllodipine 5 mg daily  Lab Results  Component Value Date   NA 138 08/02/2024   K 3.8 08/02/2024   CO2 23 08/02/2024   GLUCOSE 85 08/02/2024   BUN <5 (L) 08/02/2024   CREATININE 0.67 08/02/2024   CALCIUM  8.3 (L) 08/02/2024   GFR 82.93 03/23/2024   GFRNONAA >60 08/02/2024     Lab Results  Component Value Date   ALT 11 07/30/2024   AST 29 07/30/2024   ALKPHOS 51 07/30/2024   BILITOT 1.4 (H) 07/30/2024   Lab Results  Component Value Date   WBC 3.0 (L) 08/02/2024   HGB 11.4 (L) 08/02/2024   HCT 35.5 (L) 08/02/2024   MCV 87.2 08/02/2024   PLT 239 08/02/2024   Lab Results  Component Value Date   CHOL 157 12/02/2023   HDL 34.10 (L) 12/02/2023   LDLCALC 76 12/02/2023   LDLDIRECT 60.0 10/26/2018   TRIG 234.0 (H) 12/02/2023   CHOLHDL 5 12/02/2023  Atorvastatin  10 mg every other day         08/09/2024   12:02 PM 08/03/2024    2:19 PM 07/22/2024   11:20 AM 06/24/2024   11:39 AM  05/27/2024   11:44 AM  Depression screen PHQ 2/9  Decreased Interest 3 0 0 0 0  Down, Depressed, Hopeless 0 0 1 3 0  PHQ - 2 Score 3 0 1 3 0  Altered sleeping 2   2   Tired, decreased energy 3   3   Change in appetite 3   3   Feeling bad or failure about yourself  2   1   Trouble concentrating 1   0   Moving slowly or fidgety/restless 0   0   Suicidal thoughts 0   1   PHQ-9 Score 14   13   Difficult doing work/chores Somewhat difficult   Very difficult       08/09/2024   12:03 PM 08/26/2023   10:59 AM 05/27/2023    3:23 PM 03/02/2023    2:24 PM  GAD 7 : Generalized Anxiety Score  Nervous, Anxious, on Edge 2 2 0 1  Control/stop worrying 1 0 0 0  Worry too much - different things 1 0 1 0  Trouble relaxing 3 0 0 0  Restless 2 2 0 0  Easily annoyed or irritable 1 1 0 2  Afraid - awful might happen 1 0 0 1  Total GAD 7  Score 11 5 1 4   Anxiety Difficulty Very difficult Extremely difficult Not difficult at all Not difficult at all       Patient Active Problem List   Diagnosis Date Noted   Moderate mitral regurgitation 08/09/2024   Presence of heart assist device (HCC) 08/09/2024   Bradycardia 07/29/2024   Knee swelling 06/03/2024   Buttock pain 06/03/2024   Injury of right hip 02/02/2024   Coccyx pain 02/02/2024   Fall at home, initial encounter 02/02/2024   Estrogen deficiency 12/02/2023   Depressed mood 09/05/2023   Hypokalemia 09/04/2023   Dizziness 08/26/2023   Decreased appetite 08/26/2023   History of UTI 08/26/2023   Heart murmur 08/27/2022   Mobility impaired 07/22/2022   Current use of proton pump inhibitor 10/01/2021   General weakness 10/01/2021   Poor balance 10/01/2021   On prednisone  therapy 10/01/2021   Diabetes mellitus treated with oral medication (HCC) 11/13/2020   Hyperlipidemia associated with type 2 diabetes mellitus (HCC) 10/15/2020   Fatigue 01/30/2020   Encounter for screening mammogram for breast cancer 10/27/2017   Routine general medical examination at a health care facility 10/27/2017   Aortic atherosclerosis 02/03/2017   RA (rheumatoid arthritis) (HCC)    Adjustment disorder with mixed anxiety and depressed mood 06/24/2016   Essential hypertension 01/07/2016   Coronary atherosclerosis 11/07/2015   Osteoarthritis 05/10/2014   GERD 07/09/2007   Past Medical History:  Diagnosis Date   Allergic rhinitis    Cataract 2015   bilateral   COVID-19 10/2019   ASYMPTOMATIC   Diabetes mellitus without complication (HCC)    Diverticulosis    Esophageal reflux    Essential hypertension 01/07/2016   Fatty liver 2018   Fibula fracture 02/2005   Former smoker    Gastritis 2003   Heart murmur    MILD, NO CARDIOLOGIST   Hemorrhoids    History of cardiac dysrhythmia 2011   ABLATION  AT [facility]   History of kidney stones YRS AGO   Hyperglycemia    Hyperlipemia     Osteoarthritis    Personal history of colonic polyps 1998   hyperplastic   PMB (postmenopausal bleeding)    Pre-diabetes    BORDERLINE DIET CONTROLLED DOES NOT CHECK  CBG   RA (rheumatoid arthritis) (HCC)    Rectal bleed 2018   TRANSFUSION GIVEN   Shingles 2012   Uterine fibroid    Past Surgical History:  Procedure Laterality Date   CHOLECYSTECTOMY  YRS AGO   COLONOSCOPY  11/08   internal hemorrhoids   DILATATION & CURETTAGE/HYSTEROSCOPY WITH MYOSURE N/A 07/06/2020   Procedure: DILATATION & CURETTAGE/HYSTEROSCOPY;  Surgeon: Arnaldo Purchase, MD;  Location: [facility] [place];  Service: Gynecology;  Laterality: N/A;  request 7:30am OR time in [place] Gyn block requests 30 minutes   EP procedure  1994   SVT; normal echo   ESOPHAGOGASTRODUODENOSCOPY  11/03   reactive gastropathy   EYE SURGERY Bilateral 2017   CATARACTS   LIPOMA EXCISION  11/2002   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 ablation  2011   AT [facility]   TUBAL LIGATION  YRS AGO   vaginal polyp excised  12/2000   benign   Social History   Tobacco Use   Smoking status: Former    Current packs/day: 0.00    Average packs/day: 0.8 packs/day for 15.0 years (11.3 ttl pk-yrs)    Types: Cigarettes    Start date: 10/20/1988    Quit date: 10/21/2003    Years since quitting: 20.8   Smokeless tobacco: Never  Vaping Use   Vaping status: Never Used  Substance Use Topics   Alcohol use: No    Alcohol/week: 0.0 standard drinks of alcohol   Drug use: No   Family History  Problem Relation Age of Onset   Colon cancer Father 61   Diabetes Mother    Heart disease Mother    Kidney disease Mother        ESRD   Stroke Mother    Hypertension Brother    Breast cancer Sister 30   Diabetes Other        nephews   Colon cancer Sister    Esophageal cancer Neg Hx     Stomach cancer Neg Hx    Rectal cancer Neg Hx    Allergies  Allergen Reactions   Bee Venom Anaphylaxis   Dicyclomine      Blurry vision   Humira [Adalimumab]     LIPS SWELLED AND RASH   Metformin  And Related     rash   Current Outpatient Medications on File Prior to Visit  Medication Sig Dispense Refill   ACCU-CHEK GUIDE TEST test strip USE TO CHECK BLOOD SUGAR DAILY 100 strip 1   Accu-Chek Softclix Lancets lancets Check glucose level daily and as needed for diabetes type 2  (lancets for the soft clicks lancing device AccuChek meter) 100 each 3   acetaminophen  (TYLENOL ) 325 MG tablet Take 650 mg by mouth every 6 (six) hours as needed.     albuterol  (PROVENTIL  HFA;VENTOLIN  HFA) 108 (90 Base) MCG/ACT inhaler Inhale 2 puffs into the lungs every 4 (four) hours as needed for wheezing or shortness of breath. Please give spacer if possible 1 Inhaler 3   amLODipine  (NORVASC ) 5 MG tablet TAKE 1 TABLET (5 MG TOTAL) BY MOUTH DAILY. 90 tablet 1   atorvastatin  (LIPITOR) 10 MG tablet TAKE 0.5 TABLETS (5 MG TOTAL) BY MOUTH EVERY OTHER DAY. 22 tablet 3   blood glucose meter kit and supplies To check glucose daily and prn for DM2 E11.9 1 each 0   Blood Pressure  Monitoring (ADULT BLOOD PRESSURE CUFF LG) KIT Blood pressure cuff size large for the arm to check blood pressure daily and as needed for HTN 1 kit 0   EPINEPHrine (EPIPEN IJ) Inject as directed as needed. Reported on 12/05/2015     fluticasone  (FLONASE ) 50 MCG/ACT nasal spray PLACE 2 SPRAYS INTO THE NOSE DAILY. (Patient taking differently: Place 2 sprays into both nostrils daily as needed for allergies or rhinitis.) 48 g 3   folic acid  (FOLVITE ) 1 MG tablet Take 1 mg by mouth daily.     glipiZIDE  (GLUCOTROL  XL) 2.5 MG 24 hr tablet Take 1 tablet (2.5 mg total) by mouth daily with breakfast. 90 tablet 3   hydroxychloroquine (PLAQUENIL) 200 MG tablet Take 200 mg by mouth daily.     loratadine  (CLARITIN ) 10 MG tablet Take 10 mg by mouth daily as  needed for allergies.     methotrexate (RHEUMATREX) 2.5 MG tablet Take 17.5 mg by mouth once a week. Caution:Chemotherapy. Protect from light. ( Tuesday of each week )     pantoprazole  (PROTONIX ) 40 MG tablet TAKE 1 TABLET BY MOUTH EVERY DAY 90 tablet 0   polyethylene glycol (MIRALAX  / GLYCOLAX ) packet Take 17 g by mouth daily.     triamcinolone  cream (KENALOG ) 0.1 % Apply 1 Application topically 2 (two) times daily as needed. On rash/affected area 15 g 0   No current facility-administered medications on file prior to visit.    Review of Systems  Constitutional:  Positive for fatigue. Negative for activity change, appetite change, fever and unexpected weight change.  HENT:  Negative for congestion, ear pain, rhinorrhea, sinus pressure and sore throat.   Eyes:  Negative for pain, redness and visual disturbance.  Respiratory:  Negative for cough, shortness of breath and wheezing.   Cardiovascular:  Negative for chest pain, palpitations and leg swelling.  Gastrointestinal:  Negative for abdominal pain, blood in stool, constipation and diarrhea.  Endocrine: Negative for polydipsia and polyuria.  Genitourinary:  Negative for dysuria, frequency and urgency.  Musculoskeletal:  Positive for arthralgias. Negative for back pain and myalgias.  Skin:  Negative for pallor and rash.  Allergic/Immunologic: Negative for environmental allergies.  Neurological:  Negative for dizziness, syncope and headaches.  Hematological:  Negative for adenopathy. Does not bruise/bleed easily.  Psychiatric/Behavioral:  Negative for decreased concentration and dysphoric mood. The patient is not nervous/anxious.        Objective:   Physical Exam Constitutional:      General: She is not in acute distress.    Appearance: Normal appearance. She is well-developed and normal weight. She is not ill-appearing or diaphoretic.  HENT:     Head: Normocephalic and atraumatic.  Eyes:     Conjunctiva/sclera: Conjunctivae normal.      Pupils: Pupils are equal, round, and reactive to light.  Neck:     Thyroid : No thyromegaly.     Vascular: No carotid bruit or JVD.  Cardiovascular:     Rate and Rhythm: Normal rate and regular rhythm.     Heart sounds: Murmur heard.     No gallop.  Pulmonary:     Effort: Pulmonary effort is normal. No respiratory distress.     Breath sounds: Normal breath sounds. No wheezing or rales.  Chest:     Chest wall: No tenderness.  Abdominal:     General: There is no distension or abdominal bruit.     Palpations: Abdomen is soft.  Musculoskeletal:     Cervical back: Normal range of motion  and neck supple.     Right lower leg: No edema.     Left lower leg: No edema.     Comments: Mobility impaired Wheelchair bound   Lymphadenopathy:     Cervical: No cervical adenopathy.  Skin:    General: Skin is warm and dry.     Coloration: Skin is not pale.     Findings: No rash.  Neurological:     Mental Status: She is alert.     Cranial Nerves: No cranial nerve deficit.     Coordination: Coordination normal.     Deep Tendon Reflexes: Reflexes are normal and symmetric. Reflexes normal.  Psychiatric:        Mood and Affect: Mood normal.           Assessment & Plan:   Problem List Items Addressed This Visit       Cardiovascular and Mediastinum   Moderate mitral regurgitation   Reviewed echo No symptoms       Essential hypertension - Primary   BP: 122/65  Blood pressure seems adequately controlled off chlorthalidone  now (holding this and K) On amlodipine  5 mg daily  With pacemaker  Reviewed hospital records, lab results and studies in detail   Bmet today Continues to have daughter organize and admin medications       Relevant Orders   Basic metabolic panel with GFR   CBC with Differential/Platelet     Endocrine   Diabetes mellitus treated with oral medication (HCC)   From prednisone  Asked pt to call with her dose of prednisone  , is not on med list A1c today        Relevant Orders   Hemoglobin A1c     Other   Presence of heart assist device [facility])   Pacemaker Doing well      Hypokalemia   Off thiazide and K now  Lab today      Relevant Orders   Basic metabolic panel with GFR   Heart murmur   Moderate MR on echo Not symptomatic       Bradycardia   Seen in ER with rate in 30s, symptomatic  Reviewed hospital records, lab results and studies in detail  S/p pacemaker Symptomatically much improved now Pulse 88   For cardiology follow up in a week

## 2024-08-09 NOTE — Assessment & Plan Note (Signed)
 Seen in ER with rate in 30s, symptomatic  Reviewed hospital records, lab results and studies in detail  S/p pacemaker Symptomatically much improved now Pulse 88   For cardiology follow up in a week

## 2024-08-10 NOTE — Transitions of Care (Post Inpatient/ED Visit) (Signed)
 08/10/2024  Patient ID: [name], female   DOB: 1941/03/27, 83 y.o.   MRN: [id number]  Telephone call attempt to patient. Attempted call x 2. Phone only rang. Unable to leave voice message.    Arvin Seip RN, BSN, CCM [employer], Population Health Case Manager Phone: [phone]

## 2024-08-11 ENCOUNTER — Telehealth: Payer: Self-pay

## 2024-08-11 NOTE — Patient Instructions (Signed)
 Visit Information  Thank you for taking time to visit with me today. Please don't hesitate to contact me if I can be of assistance to you before our next scheduled telephone appointment.  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  Reviewed upcoming provider visits Advised to take medications as prescribed.  Assessed for signs of infection at wound site Assessed pain level Assessed for ongoing home health services Assessed for falls Advised to monitor incision site for redness, drainage, swelling, skin being hot to touch or fever.  Notify provider immediately for these symptoms Do not lift your arm above shoulder height for 1 week after your procedure. After 7 days, you may progress as below.  You should remove your sling 24 hours after your procedure, unless otherwise instructed by your provider.      Tuesday August 09, 2024  Wednesday August 10, 2024 Thursday August 11, 2024 Friday August 12, 2024    Reinforced: Do not lift, push, pull, or carry anything over 10 pounds with the affected arm until 6 weeks (Tuesday September 13, 2024 ) after your procedure.    You may drive AFTER your wound check, unless you have been told otherwise by your provider.     Patient Self Care Activities:  Call pharmacy for medication  refills 3-7 days in advance of running out of medications Call provider office for new concerns or questions  Notify RN Care Manager of TOC call rescheduling needs Participate in Transition of Care Program/Attend TOC scheduled calls Take medications as prescribed   Continue to use ambulatory device for fall safety Do not lift your arm above shoulder height for 1 week after your procedure. After 7 days, you may progress as below.  You should remove your sling 24 hours after your procedure, unless otherwise instructed by your provider.      Tuesday August 09, 2024  Wednesday August 10, 2024 Thursday August 11, 2024 Friday August 12, 2024    Do not lift, push, pull, or carry anything over 10 pounds with the affected arm until 6 weeks (Tuesday September 13, 2024 ) after your procedure.    You may drive AFTER your wound check, unless you have been told otherwise by your provider.     Plan:  Telephone follow up appointment with care management team member scheduled for:  08/17/24 at 1 pm The patient has been provided with contact information for the care management team and has been advised to call with any health related questions or concerns.         Patient verbalizes understanding of instructions and care plan provided today and agrees to view in MyChart. Active MyChart status and patient understanding of how to access instructions and care plan via MyChart confirmed with patient.     The patient has been provided with contact  information for the care management team and has been advised to call with any health related questions or concerns.   Please call the care guide team at [phone] if you need to cancel or reschedule your appointment.   Please call the Suicide and Crisis Lifeline: 988 call the [place]  National Suicide Prevention Lifeline: (684)634-0215 or TTY: 726-587-5585 TTY 901 876 6659) to talk to a trained counselor call 1-800-273-TALK (toll free, 24 hour hotline) if  you are experiencing a Mental Health or Behavioral Health Crisis or need someone to talk to.  Arvin Seip RN, BSN, CCM [employer], Population Health Case Manager Phone: [phone]

## 2024-08-11 NOTE — Transitions of Care (Post Inpatient/ED Visit) (Signed)
 Transition of Care week 2  Visit Note  08/11/2024  Name: [name] MRN: [id number]          DOB: Feb 25, 1941  Situation: Patient enrolled in [facility] 30-day program. Visit completed with patient by telephone.   Background:   Initial Transition Care Management Follow-up Telephone Call Discharge Date and Diagnosis: 08/02/24, bradycardia/ SVT   Past Medical History:  Diagnosis Date   Allergic rhinitis    Cataract 2015   bilateral   COVID-19 10/2019   ASYMPTOMATIC   Diabetes mellitus without complication (HCC)    Diverticulosis    Esophageal reflux    Essential hypertension 01/07/2016   Fatty liver 2018   Fibula fracture 02/2005   Former smoker    Gastritis 2003   Heart murmur    MILD, NO CARDIOLOGIST   Hemorrhoids    History of cardiac dysrhythmia 2011   ABLATION  AT [facility]   History of kidney stones YRS AGO   Hyperglycemia    Hyperlipemia    Osteoarthritis    Personal history of colonic polyps 1998   hyperplastic   PMB (postmenopausal bleeding)    Pre-diabetes    BORDERLINE DIET CONTROLLED DOES NOT CHECK CBG   RA (rheumatoid arthritis) (HCC)    Rectal bleed 2018   TRANSFUSION GIVEN   Shingles 2012   Uterine fibroid     Assessment: Patient Reported Symptoms: Cognitive Cognitive Status: No symptoms reported, Alert and oriented to person, place, and time, Insightful and able to interpret abstract concepts, Normal speech and language skills      Neurological Neurological Review of Symptoms: No symptoms reported    HEENT HEENT Symptoms Reported: No symptoms reported      Cardiovascular Cardiovascular Symptoms Reported: Other: Other Cardiovascular Symptoms: patient reports ongoing mild shoulder pain. She states she reported this to her primary care provider at 08/09/24 appointment.  Patient states otherwise she is doing well status post defibrillator placement. patient reports having a cardiology wound check appointment on 08/16/24. Cardiovascular Management  Strategies: Routine screening, Medical device  Respiratory Respiratory Symptoms Reported: No symptoms reported    Endocrine Endocrine Symptoms Reported: No symptoms reported Is patient diabetic?: Yes Is patient checking blood sugars at home?: Yes List most recent blood sugar readings, include date and time of day: patient states she normally checks her blood sugars however she has not been able to lately because the needle doesn't prick her skin well enough to draw blood.  Patient states she spoke with a nurse at her primary care providers office and she is going to help her deal with this issue.    Gastrointestinal Gastrointestinal Symptoms Reported: Nausea Additional Gastrointestinal Details: patient reports occasional nausea around 1-2 times per month.  She states her appetite is a little better.      Genitourinary Genitourinary Symptoms Reported: No symptoms reported    Integumentary Integumentary Symptoms Reported: Incision Additional Integumentary Details: Per chart review patient is status post defibrillator placement on 07/22/24. She states she is unable to see the incision site due to dressing still being on. Patient states around the dressing she is not seeing any signs of infection. She reports having a wound check scheduled for 08/16/24    Musculoskeletal Musculoskelatal Symptoms Reviewed: Joint pain, Back pain, Unsteady gait, Weakness Additional Musculoskeletal Details: patient reports ongoing pain due to RA. she reports using her rolling walker/ cane and having ongoing home health PT weekly. Musculoskeletal Management Strategies: Routine screening, Medical device, Medication therapy      Psychosocial Psychosocial Symptoms  Reported: No symptoms reported         There were no vitals filed for this visit.  Medications Reviewed Today     Reviewed by Isabellarose Kope E, RN (Registered Nurse) on 08/11/24 at [phone]  Med List Status: <None>   Medication Order Taking? Sig Documenting  Provider Last Dose Status Informant  ACCU-CHEK GUIDE TEST test strip [id number] Yes USE TO CHECK BLOOD SUGAR DAILY Tower, Laine LABOR, MD  Active Spouse/Significant Other, Pharmacy Records  Accu-Chek Softclix Lancets lancets [id number] Yes Check glucose level daily and as needed for diabetes type 2  (lancets for the soft clicks lancing device AccuChek meter) Tower, Laine LABOR, MD  Active Spouse/Significant Other, Pharmacy Records  acetaminophen  (TYLENOL ) 325 MG tablet [id number] Yes Take 650 mg by mouth every 6 (six) hours as needed. [provider]  Active Spouse/Significant Other, Pharmacy Records  albuterol  (PROVENTIL  HFA;VENTOLIN  HFA) 108 (90 Base) MCG/ACT inhaler [id number] Yes Inhale 2 puffs into the lungs every 4 (four) hours as needed for wheezing or shortness of breath. Please give spacer if possible Tower, Laine LABOR, MD  Active Spouse/Significant Other, Pharmacy Records  amLODipine  (NORVASC ) 5 MG tablet [id number] Yes TAKE 1 TABLET (5 MG TOTAL) BY MOUTH DAILY. Tower, Laine LABOR, MD  Active Spouse/Significant Other, Pharmacy Records  atorvastatin  (LIPITOR) 10 MG tablet [id number] Yes TAKE 0.5 TABLETS (5 MG TOTAL) BY MOUTH EVERY OTHER DAY. Tower, Laine LABOR, MD  Active Spouse/Significant Other, Pharmacy Records           Med Note (WHITE, DONETA RAMAN   Fri Jul 29, 2024 10:06 PM) Taken occasionally  blood glucose meter kit and supplies [id number] Yes To check glucose daily and prn for DM2 E11.9 Tower, Laine LABOR, MD  Active Spouse/Significant Other, Pharmacy Records  Blood Pressure Monitoring (ADULT BLOOD PRESSURE CUFF LG) KIT [id number] Yes Blood pressure cuff size large for the arm to check blood pressure daily and as needed for HTN Tower, Laine LABOR, MD  Active Spouse/Significant Other, Pharmacy Records  EPINEPHrine [facility] IJ) [id number] Yes Inject as directed as needed. Reported on 12/05/2015 [provider]  Active Spouse/Significant Other, Pharmacy Records  fluticasone  (FLONASE ) 50 MCG/ACT nasal spray  [id number] Yes PLACE 2 SPRAYS INTO THE NOSE DAILY.  Patient taking differently: Place 2 sprays into both nostrils daily as needed for allergies or rhinitis.   Tower, Laine LABOR, MD  Active Spouse/Significant Other, Pharmacy Records           Med Note (WHITE, DONETA RAMAN   Fri Jul 29, 2024 10:08 PM)    folic acid  (FOLVITE ) 1 MG tablet [id number] Yes Take 1 mg by mouth daily. [provider]  Active Spouse/Significant Other, Pharmacy Records  glipiZIDE  (GLUCOTROL  XL) 2.5 MG 24 hr tablet [id number] Yes Take 1 tablet (2.5 mg total) by mouth daily with breakfast. Tower, Laine LABOR, MD  Active Spouse/Significant Other, Pharmacy Records  hydroxychloroquine (PLAQUENIL) 200 MG tablet [id number] Yes Take 200 mg by mouth daily. [provider]  Active Spouse/Significant Other, Pharmacy Records  loratadine  (CLARITIN ) 10 MG tablet [id number] Yes Take 10 mg by mouth daily as needed for allergies. [provider]  Active Spouse/Significant Other, Pharmacy Records  methotrexate (RHEUMATREX) 2.5 MG tablet [id number] Yes Take 17.5 mg by mouth once a week. Caution:Chemotherapy. Protect from light. ( Tuesday of each week ) [provider]  Active Spouse/Significant Other, Pharmacy Records  pantoprazole  (PROTONIX ) 40 MG tablet [id number] Yes TAKE 1 TABLET BY MOUTH EVERY DAY Tower, Laine LABOR, MD  Active Spouse/Significant Other,  Pharmacy Records  polyethylene glycol (MIRALAX  / GLYCOLAX ) packet [id number] Yes Take 17 g by mouth daily. [provider]  Active Spouse/Significant Other, Pharmacy Records  triamcinolone  cream (KENALOG ) 0.1 % [id number] Yes Apply 1 Application topically 2 (two) times daily as needed. 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  Reviewed upcoming provider visits Advised to take medications as prescribed.  Assessed for signs of infection at wound site Assessed pain level Assessed for ongoing home health services Assessed for falls Advised to monitor incision site for redness, drainage, swelling, skin being hot to touch or fever.  Notify provider immediately for these symptoms Do not lift your arm above shoulder height for 1 week after your procedure. After 7 days, you may progress as below.  You should remove your sling 24 hours after your procedure, unless otherwise instructed by your provider.      Tuesday August 09, 2024  Wednesday August 10, 2024 Thursday August 11, 2024 Friday August 12, 2024    Reinforced: Do not lift, push, pull, or carry anything over 10 pounds with the affected arm until 6 weeks (Tuesday September 13, 2024 ) after your procedure.    You may drive AFTER your wound check, unless you have been told otherwise by your provider.     Patient Self Care Activities:  Call pharmacy for medication refills 3-7 days in advance of running out of medications Call provider office for new concerns or questions  Notify RN Care Manager of TOC call rescheduling needs Participate in Transition of Care Program/Attend TOC scheduled calls Take medications as prescribed   Continue to use ambulatory device for fall safety Do not lift your arm above shoulder height for 1 week after your procedure. After 7 days, you may progress as below.  You should remove your sling 24 hours  after your procedure, unless otherwise instructed by your provider.      Tuesday August 09, 2024  Wednesday August 10, 2024 Thursday August 11, 2024 Friday August 12, 2024    Do not lift, push, pull, or carry anything over 10 pounds with the affected arm until 6 weeks (Tuesday September 13, 2024 ) after your procedure.    You may drive AFTER your wound check, unless you have been told otherwise by your provider.     Plan:  Telephone follow up appointment with care management team member scheduled for:  08/17/24 at 1 pm The patient has been provided with contact information for the care management team and has been advised to call with any health related questions or concerns.          Recommendation:   Continue Current Plan of Care  Follow Up Plan:   Telephone follow-up in 1 week  Arvin Seip RN, BSN, CCM [place]  [facility], [employer]  Health Case Manager Phone: [phone]

## 2024-08-11 NOTE — Patient Outreach (Signed)
 RNCM notified by [facility] Arvin Seip patient will be in [facility] Program x4 Paytan Recine. Spoke with patient and rescheduled for 08/31/2024 at 1030.

## 2024-08-12 ENCOUNTER — Telehealth: Payer: Self-pay | Admitting: *Deleted

## 2024-08-12 NOTE — Telephone Encounter (Signed)
 Copied from CRM [id number]. Topic: Clinical - Home Health Verbal Orders >> Aug 12, 2024 12:46 PM Mercedes MATSU wrote: Caller/Agency: Corean from [facility] Callback Number: [phone] secure line Service Requested: Occupational Therapy Frequency: 1 time a week for 3 weeks. Any new concerns about the patient? No

## 2024-08-12 NOTE — Telephone Encounter (Signed)
Left VM giving VO ?

## 2024-08-12 NOTE — Telephone Encounter (Signed)
Please ok that verbal order  

## 2024-08-15 ENCOUNTER — Other Ambulatory Visit: Payer: Self-pay | Admitting: Family Medicine

## 2024-08-16 ENCOUNTER — Ambulatory Visit: Attending: Internal Medicine

## 2024-08-16 ENCOUNTER — Ambulatory Visit

## 2024-08-16 DIAGNOSIS — I441 Atrioventricular block, second degree: Secondary | ICD-10-CM

## 2024-08-16 NOTE — Patient Instructions (Signed)
   After Your Pacemaker   Monitor your pacemaker site for redness, swelling, and drainage. Call the device clinic at [phone] if you experience these symptoms or fever/chills.  Your incision was closed with Steri-strips or staples:  You may shower 7 days after your procedure and wash your incision with soap and water. Avoid lotions, ointments, or perfumes over your incision until it is well-healed.  You may use a hot tub or a pool after your wound check appointment if the incision is completely closed.  Do not lift, push or pull greater than 10 pounds with the affected arm until 6 weeks after your procedure. UNTIL AFTER NOVEMBER 24TH.  There are no other restrictions in arm movement after your wound check appointment.  You may drive, unless driving has been restricted by your healthcare providers.  Remote monitoring is used to monitor your pacemaker from home. This monitoring is scheduled every 91 days by our office. It allows us  to keep an eye on the functioning of your device to ensure it is working properly. You will routinely see your Electrophysiologist annually (more often if necessary).

## 2024-08-17 ENCOUNTER — Telehealth: Payer: Self-pay

## 2024-08-17 LAB — CUP PACEART INCLINIC DEVICE CHECK
Battery Remaining Longevity: 127 mo
Battery Voltage: 3.05 V
Brady Statistic RA Percent Paced: 1.7 %
Brady Statistic RV Percent Paced: 99.8 %
Date Time Interrogation Session: 20251028105300
Implantable Lead Connection Status: 753985
Implantable Lead Connection Status: 753985
Implantable Lead Implant Date: 20251013
Implantable Lead Implant Date: 20251013
Implantable Lead Location: 753859
Implantable Lead Location: 753860
Implantable Pulse Generator Implant Date: 20251013
Lead Channel Impedance Value: 462.5 Ohm
Lead Channel Impedance Value: 475 Ohm
Lead Channel Pacing Threshold Amplitude: 0.625 V
Lead Channel Pacing Threshold Amplitude: 0.75 V
Lead Channel Pacing Threshold Amplitude: 1 V
Lead Channel Pacing Threshold Amplitude: 1 V
Lead Channel Pacing Threshold Pulse Width: 0.5 ms
Lead Channel Pacing Threshold Pulse Width: 0.5 ms
Lead Channel Pacing Threshold Pulse Width: 0.5 ms
Lead Channel Pacing Threshold Pulse Width: 0.5 ms
Lead Channel Sensing Intrinsic Amplitude: 5 mV
Lead Channel Sensing Intrinsic Amplitude: 6.9 mV
Lead Channel Setting Pacing Amplitude: 0.875
Lead Channel Setting Pacing Amplitude: 1.875
Lead Channel Setting Pacing Pulse Width: 0.5 ms
Lead Channel Setting Sensing Sensitivity: 2 mV
Pulse Gen Model: 2272
Pulse Gen Serial Number: 8302199

## 2024-08-17 NOTE — Patient Instructions (Signed)
 Visit Information  Thank you for taking time to visit with me today. Please don't hesitate to contact me if I can be of assistance to you before our next scheduled telephone appointment.  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  Discussed cardiology visit from 08/16/24.  Assessed for new/ ongoing symptoms.  Reviewed upcoming provider visits Medications reviewed and compliance discussed.  Assessed pain level Assessed for ongoing home health services Assessed for falls Advised to monitor incision site for redness, drainage, swelling, skin being hot to touch or fever.  Notify provider immediately for these symptoms Do not lift your arm above shoulder height for 1 week after your procedure. After 7 days, you may progress as below.  You should remove your sling 24 hours after your procedure, unless otherwise instructed by your provider.      Reinforced: Do not lift, push, pull, or carry anything over 10 pounds with the affected arm until 6 weeks (Tuesday September 13, 2024 ) after your procedure.    Patient Self Care Activities:  Call pharmacy for medication refills 3-7 days in advance of running out of medications Call provider office for new concerns or questions  Notify RN Care Manager of TOC call rescheduling  needs Participate in Transition of Care Program/Attend TOC scheduled calls Take medications as prescribed   Continue to use ambulatory device for fall safety Do not lift your arm above shoulder height for 1 week after your procedure. After 7 days, you may progress as below.      Tuesday August 09, 2024  Wednesday August 10, 2024 Thursday August 11, 2024 Friday August 12, 2024    Do not lift, push, pull, or carry anything over 10 pounds with the affected arm until 6 weeks (Tuesday September 13, 2024 ) after your procedure.    You may drive AFTER your wound check, unless you have been told otherwise by your provider.     Plan:  Telephone follow up appointment with care management team member scheduled for:  08/26/24 at 2 pm The patient has been provided with contact information for the care management team and has been advised to call with any health related questions or concerns.         Patient verbalizes understanding of instructions and care plan provided today and agrees to view in MyChart. Active MyChart status and patient understanding of how to access instructions and care plan via MyChart confirmed with patient.     The patient has been provided with contact information for the care management team and has been advised to call with any health related questions or concerns.   Please call the care guide team at [phone] if you need to cancel or reschedule your appointment.   Please call the Suicide and Crisis Lifeline: 988 call the [place]  National Suicide Prevention  Lifeline: 917 515 6798 or TTY: 914-616-7756 TTY 364-153-9849) to talk to a trained counselor call 1-800-273-TALK (toll free, 24 hour hotline) if you are experiencing a Mental Health or Behavioral Health Crisis or need someone to talk to.  Arvin Seip RN, BSN, CCM [employer], Population Health Case Manager Phone: [phone]

## 2024-08-17 NOTE — Telephone Encounter (Signed)
 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   During interrogation noted the following to review with Dr. Deno:    Patient continues to have short bursts of Afib, longest duration 6 minutes.  16 AMS episodes recorded by device over past few days.  No OAC, patient states, has hx of Afib but they took her off her blood thinner years ago.   Dr. Inocencio recent hospital progress note addressing AF history states:  Atrial flutter: Noted by general cardiology.  She has not had atrial flutter on telemetry or EKGs that I can see since she has been here.  Will hold heparin.  Will restart anticoagulation if she has atrial flutter noted on pacemaker interrogation.    Do we want to restart OAC at this point or continue to monitor?  Patient has RV - LB lead.  She has intact A/V conduction with underlying today (AS/VS 93), presenting is AS/VP at 94 due to AV delays at 190/170.  She is VP 99% of the time.  States she feels great and family reports noticeable more energy since implant.    Just flagging for review, would we want to consider extending AV delays to 200/200 to promote intrinsic or just to continue to monitor for now given she is feeling very well and has a left bundle lead? She does 1:1 conduct with av delays at .

## 2024-08-17 NOTE — Transitions of Care (Post Inpatient/ED Visit) (Signed)
 Transition of Care week 3  Visit Note  08/17/2024  Name: [name] MRN: [id number]          DOB: [birth year]  Situation: Patient enrolled in [facility] 30-day program. Visit completed with patient by telephone.   Background:   Initial Transition Care Management Follow-up Telephone Call Discharge Date and Diagnosis: 08/02/24, bradycardia/ SVT   Past Medical History:  Diagnosis Date   Allergic rhinitis    Cataract 2015   bilateral   COVID-19 10/2019   ASYMPTOMATIC   Diabetes mellitus without complication (HCC)    Diverticulosis    Esophageal reflux    Essential hypertension 01/07/2016   Fatty liver 2018   Fibula fracture 02/2005   Former smoker    Gastritis 2003   Heart murmur    MILD, NO CARDIOLOGIST   Hemorrhoids    History of cardiac dysrhythmia 2011   ABLATION  AT [facility]   History of kidney stones YRS AGO   Hyperglycemia    Hyperlipemia    Osteoarthritis    Personal history of colonic polyps 1998   hyperplastic   PMB (postmenopausal bleeding)    Pre-diabetes    BORDERLINE DIET CONTROLLED DOES NOT CHECK CBG   RA (rheumatoid arthritis) (HCC)    Rectal bleed 2018   TRANSFUSION GIVEN   Shingles 2012   Uterine fibroid     Assessment: Patient Reported Symptoms: Cognitive Cognitive Status: Alert and oriented to person, place, and time, Insightful and able to interpret abstract concepts, Normal speech and language skills      Neurological Neurological Review of Symptoms: No symptoms reported    HEENT HEENT Symptoms Reported: No symptoms reported      Cardiovascular Cardiovascular Symptoms Reported: Other: Other Cardiovascular Symptoms: Patient states,  I'm not having any problems out of my heart. My heart seems to be doing really wel since having the procedre. Patient statse she is scheduled for a pacemaker check on 09/12/24 and is scheduled for a pacemaker check on 11/24 and has a follow up appointment with the cardiologist on 11/03/23.    Respiratory  Respiratory Symptoms Reported: Shortness of breath    Endocrine Endocrine Symptoms Reported: No symptoms reported    Gastrointestinal Gastrointestinal Symptoms Reported: No symptoms reported      Genitourinary Genitourinary Symptoms Reported: No symptoms reported    Integumentary Integumentary Symptoms Reported: Incision Additional Integumentary Details: patient states pacemaker surgical site is doing fine. She denies any signs of infection.  Patient states the doctor removed her dressing to the pacemaker site. She states she area is a little tight however she doens't have any pain there.  Patient states most of her pain stems from the Rheumatoid arthritis. Skin Management Strategies: Routine screening, Medication therapy, Medical device  Musculoskeletal Musculoskelatal Symptoms Reviewed: Joint pain, Unsteady gait, Weakness Additional Musculoskeletal Details: patient reports ongoing Rhuematoid arthritis pain. She states she is scheduled to see the rheumatologist in 09/2024. Musculoskeletal Management Strategies: Routine screening, Medication therapy, Medical device      Psychosocial Psychosocial Symptoms Reported: No symptoms reported         There were no vitals filed for this visit.  Medications Reviewed Today     Reviewed by Sheralyn Pinegar E, RN (Registered Nurse) on 08/17/24 at 1344  Med List Status: <None>   Medication Order Taking? Sig Documenting Provider Last Dose Status Informant  ACCU-CHEK GUIDE TEST test strip [id number] Yes USE TO CHECK BLOOD SUGAR DAILY Tower, Laine LABOR, MD  Active Spouse/Significant Other, Pharmacy Records  Accu-Chek Softclix Lancets  lancets [id number] Yes Check glucose level daily and as needed for diabetes type 2  (lancets for the soft clicks lancing device AccuChek meter) Tower, Laine LABOR, MD  Active Spouse/Significant Other, Pharmacy Records  acetaminophen  (TYLENOL ) 325 MG tablet [id number] Yes Take 650 mg by mouth every 6 (six) hours as needed. [provider]  Active Spouse/Significant Other, Pharmacy Records  albuterol  (PROVENTIL  HFA;VENTOLIN  HFA) 108 (90 Base) MCG/ACT inhaler [id number] Yes Inhale 2 puffs into the lungs every 4 (four) hours as needed for wheezing or shortness of breath. Please give spacer if possible Tower, Laine LABOR, MD  Active Spouse/Significant Other, Pharmacy Records  amLODipine  (NORVASC ) 5 MG tablet [id number] Yes TAKE 1 TABLET (5 MG TOTAL) BY MOUTH DAILY. Tower, Laine LABOR, MD  Active Spouse/Significant Other, Pharmacy Records  atorvastatin  (LIPITOR) 10 MG tablet [id number] Yes TAKE 0.5 TABLETS (5 MG TOTAL) BY MOUTH EVERY OTHER DAY. Tower, Laine LABOR, MD  Active Spouse/Significant Other, Pharmacy Records           Med Note (WHITE, DONETA RAMAN   Fri Jul 29, 2024 10:06 PM) Taken occasionally  blood glucose meter kit and supplies [id number] Yes To check glucose daily and prn for DM2 E11.9 Tower, Laine LABOR, MD  Active Spouse/Significant Other, Pharmacy Records  Blood Pressure Monitoring (ADULT BLOOD PRESSURE CUFF LG) KIT [id number] Yes Blood pressure cuff size large for the arm to check blood pressure daily and as needed for HTN Tower, Laine LABOR, MD  Active Spouse/Significant Other, Pharmacy Records  EPINEPHrine [facility] IJ) [id number] Yes Inject as directed as needed. Reported on 12/05/2015 [provider]  Active Spouse/Significant Other, Pharmacy Records  fluticasone  (FLONASE ) 50 MCG/ACT nasal spray [id number] Yes PLACE 2 SPRAYS INTO THE NOSE DAILY. Tower, Laine LABOR, MD  Active Spouse/Significant Other, Pharmacy Records           Med Note (WHITE, DONETA RAMAN   Fri Jul 29, 2024 10:08 PM)    folic acid  (FOLVITE ) 1 MG tablet [id number] Yes Take 1 mg by mouth daily. [provider]  Active Spouse/Significant Other, Pharmacy Records  glipiZIDE  (GLUCOTROL  XL) 2.5 MG 24 hr tablet [id number] Yes TAKE 1 TABLET BY MOUTH DAILY WITH BREAKFAST. Tower, Laine LABOR, MD  Active   hydroxychloroquine (PLAQUENIL) 200 MG tablet [id number] Yes Take 200 mg by  mouth daily. [provider]  Active Spouse/Significant Other, Pharmacy Records  loratadine  (CLARITIN ) 10 MG tablet [id number] Yes Take 10 mg by mouth daily as needed for allergies. [provider]  Active Spouse/Significant Other, Pharmacy Records  methotrexate (RHEUMATREX) 2.5 MG tablet [id number] Yes Take 17.5 mg by mouth once a week. Caution:Chemotherapy. Protect from light. ( Tuesday of each week ) [provider]  Active Spouse/Significant Other, Pharmacy Records  pantoprazole  (PROTONIX ) 40 MG tablet [id number] Yes TAKE 1 TABLET BY MOUTH EVERY DAY Tower, Laine LABOR, MD  Active Spouse/Significant Other, Pharmacy Records  polyethylene glycol (MIRALAX  / GLYCOLAX ) packet [id number] Yes Take 17 g by mouth daily. [provider]  Active Spouse/Significant Other, Pharmacy Records  triamcinolone  cream (KENALOG ) 0.1 % [id number] Yes Apply 1 Application topically 2 (two) times daily as needed. On rash/affected area Tower, Laine LABOR, MD  Active Spouse/Significant Other, Pharmacy Records          '  Goals Addressed             This Visit's Progress    VBCI Transitions of Care (TOC) Care Plan       Problems:  Recent Hospitalization for treatment of bradycardia Knowledge  Deficit Related to bradycardia and status post defibrillator placement  Goal:  Over the next 30 days, the patient will not experience hospital readmission  Interventions:  Transitions of Care: Post discharge activity limitations prescribed by provider reviewed Post-op wound/incision care reviewed with patient/caregiver Reviewed Signs and symptoms of infection Confirmed patient aware of wound check follow up appointment on 09/12/24.  Discussed cardiology visit from 08/16/24.  Assessed for new/ ongoing symptoms.  Reviewed upcoming provider visits Medications reviewed and compliance discussed.  Assessed pain level Assessed for ongoing home health services Assessed for falls Advised to monitor  incision site for redness, drainage, swelling, skin being hot to touch or fever.  Notify provider immediately for these symptoms Do not lift your arm above shoulder height for 1 week after your procedure. After 7 days, you may progress as below.  You should remove your sling 24 hours after your procedure, unless otherwise instructed by your provider.      Reinforced: Do not lift, push, pull, or carry anything over 10 pounds with the affected arm until 6 weeks (Tuesday September 13, 2024 ) after your procedure.    Patient Self Care Activities:  Call pharmacy for medication refills 3-7 days in advance of running out of medications Call provider office for new concerns or questions  Notify RN Care Manager of TOC call rescheduling needs Participate in Transition of Care Program/Attend TOC scheduled calls Take medications as prescribed   Continue to use ambulatory device for fall safety Do not lift your arm above shoulder height for 1 week after your procedure. After 7 days, you may progress as below.      Tuesday August 09, 2024  Wednesday August 10, 2024 Thursday August 11, 2024 Friday August 12, 2024    Do not lift, push, pull, or carry anything over 10 pounds with the affected arm until 6 weeks (Tuesday September 13, 2024 ) after your procedure.    You may drive AFTER your wound check, unless you have been told otherwise by your provider.     Plan:  Telephone follow up appointment with care management team member scheduled for:  08/26/24 at 2 pm The patient has been provided with contact information for the care management team and has been advised to call with any health related questions or concerns.          Recommendation:   Continue Current Plan of Care  Follow Up Plan:   Telephone follow-up in 1 week  Arvin Seip RN, BSN, CCM [place]  [facility], Population Health Case Manager Phone: [phone]

## 2024-08-17 NOTE — Progress Notes (Signed)
 Normal DUAL chamber pacemaker wound check. Presenting rhythm: AS/VP 94 . Wound well healed. Routine testing performed. Thresholds, sensing, and impedance consistent with implant measurements and at 3.5V safety margin/auto capture until 3 month visit.  No changes made to programming today.  Reviewed arm restrictions to continue for 6 weeks total post op.  Pt. enrolled in remote monitoring.   9 AMS episodes consistent with AF, longest duration 16 seconds. No OAC, patient states has prior hx years ago, was taken off OAC due to low burden per patient.  Per Dr. Inocencio recent hospital progress note addressing AF/flutter history:  Atrial flutter: Noted by general cardiology.  She has not had atrial flutter on telemetry or EKGs that I can see since she has been here.  Will hold heparin.  Will restart anticoagulation if she has atrial flutter noted on pacemaker interrogation.    OF NOTE:  Patient's heart rates are mildly tachycardic today with presenting between 90 - 105, with occasional PAC's noted.  She is AS/VP at 36; however, does have intact conduction AS/VS 93 on underlying.  She has a Left Bundle Lead.  Will review with Dr. Inocencio as to whether we would want to extend AV Delays from 190/170 to 200/200 to better promote intrinsic conduction; however, patient says she feels so much better since pacemaker implanted and daughter reports patient having more energy.

## 2024-08-19 ENCOUNTER — Telehealth: Payer: Self-pay

## 2024-08-19 ENCOUNTER — Ambulatory Visit: Payer: Self-pay | Admitting: Cardiology

## 2024-08-21 ENCOUNTER — Other Ambulatory Visit: Payer: Self-pay | Admitting: Family Medicine

## 2024-08-21 DIAGNOSIS — K219 Gastro-esophageal reflux disease without esophagitis: Secondary | ICD-10-CM

## 2024-08-23 NOTE — Telephone Encounter (Signed)
 Noted, have added to appointment notes to review AV delays at 90 day post op check.

## 2024-08-23 NOTE — Telephone Encounter (Addendum)
(  See response to clinical summary from Dr. Inocencio regarding AV delays): Ok to extend delays at next appointment.   I will place note in her 90 day follow up to consider whether extending delays to promote intrinsic is appropriate at next visit to 200/200 PAV/SAV.  Patient says she is feeling great with current programming so no changes made at present.    Will also continue to monitor for AF episodes.    Notified patient and daughter - who was present at device clinic appointment and patient okay'ed to call for follow up.

## 2024-08-23 NOTE — Telephone Encounter (Signed)
 ERROR

## 2024-08-25 ENCOUNTER — Telehealth: Payer: Self-pay

## 2024-08-25 NOTE — Telephone Encounter (Signed)
 Continued monitoring for AF events.  Patient not on an OAC, has been off of OAC for several years, states was taken off due to lack of events.  Short bursts seen at device clinic visit last week and noted by general cardiology in hospital notes.  Now have recording of longest duration: 2 hours . Will flag for Dr. Inocencio to consider duration and when to consider AF clinic referral.    Patient and daughter are aware and will wait to hear from Dr. Inocencio if anything further.

## 2024-08-26 ENCOUNTER — Telehealth: Payer: Self-pay

## 2024-08-26 NOTE — Transitions of Care (Post Inpatient/ED Visit) (Signed)
 Transition of Care week 4  Visit Note  08/26/2024  Name: [name] MRN: [id number]          DOB: 1941-08-20  Situation: Patient enrolled in [facility] 30-day program. Visit completed with patient by telephone.   Background:   Initial Transition Care Management Follow-up Telephone Call Discharge Date and Diagnosis: 08/02/24, bradycardia/ SVT   Past Medical History:  Diagnosis Date   Allergic rhinitis    Cataract 2015   bilateral   COVID-19 10/2019   ASYMPTOMATIC   Diabetes mellitus without complication (HCC)    Diverticulosis    Esophageal reflux    Essential hypertension 01/07/2016   Fatty liver 2018   Fibula fracture 02/2005   Former smoker    Gastritis 2003   Heart murmur    MILD, NO CARDIOLOGIST   Hemorrhoids    History of cardiac dysrhythmia 2011   ABLATION  AT [facility]   History of kidney stones YRS AGO   Hyperglycemia    Hyperlipemia    Osteoarthritis    Personal history of colonic polyps 1998   hyperplastic   PMB (postmenopausal bleeding)    Pre-diabetes    BORDERLINE DIET CONTROLLED DOES NOT CHECK CBG   RA (rheumatoid arthritis) (HCC)    Rectal bleed 2018   TRANSFUSION GIVEN   Shingles 2012   Uterine fibroid     Assessment: Patient Reported Symptoms: Cognitive Cognitive Status: Alert and oriented to person, place, and time, Insightful and able to interpret abstract concepts, Normal speech and language skills      Neurological Neurological Review of Symptoms: No symptoms reported    HEENT HEENT Symptoms Reported: Other: HEENT Comment: patient reports having occasional headaches.    Cardiovascular Cardiovascular Symptoms Reported: Other: Other Cardiovascular Symptoms: patient denies having any atrial fibrillation symptoms. She states she is scheduled with the atrial fibrillation clinic on 09/02/24.  Confirmed patient aware of pacer check on 09/12/24.  Patient states she does not monitor her blood pressure or pulse.  Patient reports taking her  medications as prescribed.  Patient reports having occasional chest discomfort. She states she lays on her left side alot and will occasionally have chest discomfort when she turns to lay on her back. Cardiovascular Management Strategies: Routine screening, Medication therapy  Respiratory Respiratory Symptoms Reported: No symptoms reported    Endocrine Endocrine Symptoms Reported: No symptoms reported    Gastrointestinal Gastrointestinal Symptoms Reported: No symptoms reported      Genitourinary Genitourinary Symptoms Reported: No symptoms reported    Integumentary Integumentary Symptoms Reported: Incision Additional Integumentary Details: patient reports pacemaker surgical site is healing. Denies any signs of infection at sight. Skin Management Strategies: Routine screening  Musculoskeletal Musculoskelatal Symptoms Reviewed: Other Additional Musculoskeletal Details: patient reports having chronic generalized arthritis pain Musculoskeletal Management Strategies: Routine screening, Medication therapy, Medical device      Psychosocial Psychosocial Symptoms Reported: No symptoms reported         There were no vitals filed for this visit.  Medications Reviewed Today     Reviewed by Verbie Babic E, RN (Registered Nurse) on 08/26/24 at 1448  Med List Status: <None>   Medication Order Taking? Sig Documenting Provider Last Dose Status Informant  ACCU-CHEK GUIDE TEST test strip [id number] Yes USE TO CHECK BLOOD SUGAR DAILY Tower, Laine LABOR, MD  Active Spouse/Significant Other, Pharmacy Records  Accu-Chek Softclix Lancets lancets [id number] Yes Check glucose level daily and as needed for diabetes type 2  (lancets for the soft clicks lancing device AccuChek meter) Tower, [employer]  A, MD  Active Spouse/Significant Other, Pharmacy Records  acetaminophen  (TYLENOL ) 325 MG tablet [id number] Yes Take 650 mg by mouth every 6 (six) hours as needed. [provider]  Active Spouse/Significant Other,  Pharmacy Records  albuterol  (PROVENTIL  HFA;VENTOLIN  HFA) 108 (90 Base) MCG/ACT inhaler [id number] Yes Inhale 2 puffs into the lungs every 4 (four) hours as needed for wheezing or shortness of breath. Please give spacer if possible Tower, Laine LABOR, MD  Active Spouse/Significant Other, Pharmacy Records  amLODipine  (NORVASC ) 5 MG tablet [id number] Yes TAKE 1 TABLET (5 MG TOTAL) BY MOUTH DAILY. Tower, Laine LABOR, MD  Active Spouse/Significant Other, Pharmacy Records  atorvastatin  (LIPITOR) 10 MG tablet [id number] Yes TAKE 0.5 TABLETS (5 MG TOTAL) BY MOUTH EVERY OTHER DAY. Tower, Laine LABOR, MD  Active Spouse/Significant Other, Pharmacy Records           Med Note (WHITE, DONETA RAMAN   Fri Jul 29, 2024 10:06 PM) Taken occasionally  blood glucose meter kit and supplies [id number] Yes To check glucose daily and prn for DM2 E11.9 Tower, Laine LABOR, MD  Active Spouse/Significant Other, Pharmacy Records  Blood Pressure Monitoring (ADULT BLOOD PRESSURE CUFF LG) KIT [id number] Yes Blood pressure cuff size large for the arm to check blood pressure daily and as needed for HTN Tower, Laine LABOR, MD  Active Spouse/Significant Other, Pharmacy Records  EPINEPHrine [facility] IJ) [id number] Yes Inject as directed as needed. Reported on 12/05/2015 [provider]  Active Spouse/Significant Other, Pharmacy Records  fluticasone  (FLONASE ) 50 MCG/ACT nasal spray [id number] Yes PLACE 2 SPRAYS INTO THE NOSE DAILY. Tower, Laine LABOR, MD  Active Spouse/Significant Other, Pharmacy Records           Med Note (WHITE, DONETA RAMAN   Fri Jul 29, 2024 10:08 PM)    folic acid  (FOLVITE ) 1 MG tablet [id number] Yes Take 1 mg by mouth daily. [provider]  Active Spouse/Significant Other, Pharmacy Records  glipiZIDE  (GLUCOTROL  XL) 2.5 MG 24 hr tablet [id number]  TAKE 1 TABLET BY MOUTH DAILY WITH BREAKFAST. Tower, Laine LABOR, MD  Active   hydroxychloroquine (PLAQUENIL) 200 MG tablet [id number] Yes Take 200 mg by mouth daily. [provider]  Active  Spouse/Significant Other, Pharmacy Records  loratadine  (CLARITIN ) 10 MG tablet [id number] Yes Take 10 mg by mouth daily as needed for allergies. [provider]  Active Spouse/Significant Other, Pharmacy Records  methotrexate (RHEUMATREX) 2.5 MG tablet [id number] Yes Take 17.5 mg by mouth once a week. Caution:Chemotherapy. Protect from light. ( Tuesday of each week ) [provider]  Active Spouse/Significant Other, Pharmacy Records  pantoprazole  (PROTONIX ) 40 MG tablet [id number] Yes TAKE 1 TABLET BY MOUTH EVERY DAY Tower, Laine LABOR, MD  Active   polyethylene glycol (MIRALAX  / GLYCOLAX ) packet [id number] Yes Take 17 g by mouth daily. [provider]  Active Spouse/Significant Other, Pharmacy Records  triamcinolone  cream (KENALOG ) 0.1 % [id number] Yes Apply 1 Application topically 2 (two) times daily as needed. On rash/affected area Tower, Laine LABOR, MD  Active Spouse/Significant Other, Pharmacy Records            Goals Addressed             This Visit's Progress    VBCI Transitions of Care (TOC) Care Plan       Problems:  Recent Hospitalization for treatment of bradycardia Knowledge Deficit Related to bradycardia and status post defibrillator placement  Goal:  Over the next 30 days, the patient will not experience hospital readmission  Interventions:  Transitions of  Care: Post-op wound/incision care reviewed with patient/caregiver Reviewed Signs and symptoms of infection Confirmed patient aware of wound check follow up appointment on 09/12/24.  Reviewed upcoming provider visits- patient scheduled for atrial fibrillation clinic on 09/02/24.  Patient states she was provided contact phone number for the device clinic by her cardiology office.  Assessed for new/ ongoing symptoms.  Medications reviewed and compliance discussed - patient states her daughter fills her pill box weekly.  Assessed pain level Assessed for ongoing home health services Assessed for  falls Advised to monitor incision site for redness, drainage, swelling, skin being hot to touch or fever.  Notify provider immediately for these symptoms Reinforced: Do not lift, push, pull, or carry anything over 10 pounds with the affected arm until 6 weeks (Tuesday September 13, 2024 ) after your procedure.  Reviewed and assessed for atrial fibrillation symptoms.  Discussed importance of monitoring pulse/ blood pressure -patient states she has a wrist blood pressure monitor but doesn't use it.  She states she will try to get a blood pressure monitor that wraps around the arm.  Advised to call 911 for severe shortness of breath or chest pain   Patient Self Care Activities:  Call pharmacy for medication refills 3-7 days in advance of running out of medications Call provider office for new concerns or questions  Notify RN Care Manager of TOC call rescheduling needs Participate in Transition of Care Program/Attend TOC scheduled calls Take medications as prescribed   Do not lift, push, pull, or carry anything over 10 pounds with the affected arm until 6 weeks (Tuesday September 13, 2024 ) after your procedure.  Notify cardiologist for atrial fibrillation symptoms: heart palpitations, shortness of breath, fatigue, weakness, dizziness, lightheadedness Consider getting arm blood pressure monitor.  Call 911 for severe shortness of breath or chest pain   Plan:  Telephone follow up appointment with care management team member scheduled for:  08/31/24 at 11amm         Recommendation:   Continue Current Plan of Care  Follow Up Plan:   Telephone follow-up in 1 week  Arvin Seip RN, BSN, CCM [facility], Population Health Case Manager Phone: [phone]

## 2024-08-26 NOTE — Telephone Encounter (Signed)
 Spoke w/ patient regarding need for AF Clinic F/U appointment to discuss possible need for OAC per Dr. Inocencio. Patient aware and agreeable. Hx AF not currently on OAC and has been off medication for several years. States was taken off OAC d/t lack of events.   Device clinic number given and informed to call if any symptoms arise or has any further questions. Patient appreciative for call.   AF Clinic appointment scheduled for 09/02/2024 w/ Clint Fenton, PA @ 3:00 pm.

## 2024-08-26 NOTE — Patient Instructions (Signed)
 Visit Information  Thank you for taking time to visit with me today. Please don't hesitate to contact me if I can be of assistance to you before our next scheduled telephone appointment.  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  Reviewed upcoming provider visits- patient scheduled for atrial fibrillation clinic on 09/02/24.  Patient states she was provided contact phone number for the device clinic by her cardiology office.  Assessed for new/ ongoing symptoms.  Medications reviewed and compliance discussed - patient states her daughter fills her pill box weekly.  Assessed pain level Assessed for ongoing home health services Assessed for falls Advised to monitor incision site for redness, drainage, swelling, skin being hot to touch or fever.  Notify provider immediately for these symptoms Reinforced: Do not lift, push, pull, or carry anything over 10 pounds with the affected arm until 6 weeks (Tuesday September 13, 2024 ) after your procedure.  Reviewed and assessed for atrial fibrillation symptoms.  Discussed importance of monitoring pulse/ blood pressure -patient states she has a wrist blood pressure monitor but doesn't use it.  She states she will try to get a blood pressure monitor that wraps around the arm.  Advised to call 911 for severe shortness of breath or chest pain   Patient  Self Care Activities:  Call pharmacy for medication refills 3-7 days in advance of running out of medications Call provider office for new concerns or questions  Notify RN Care Manager of TOC call rescheduling needs Participate in Transition of Care Program/Attend TOC scheduled calls Take medications as prescribed   Do not lift, push, pull, or carry anything over 10 pounds with the affected arm until 6 weeks (Tuesday September 13, 2024 ) after your procedure.  Notify cardiologist for atrial fibrillation symptoms: heart palpitations, shortness of breath, fatigue, weakness, dizziness, lightheadedness Consider getting arm blood pressure monitor.  Call 911 for severe shortness of breath or chest pain   Plan:  Telephone follow up appointment with care management team member scheduled for:  08/31/24 at 11amm        Patient verbalizes understanding of instructions and care plan provided today and agrees to view in MyChart. Active MyChart status and patient understanding of how to access instructions and care plan via MyChart confirmed with patient.     The patient has been provided with contact information for the care management team and has been advised to call with any health related questions or concerns.   Please call the care guide team at [phone] if you need to cancel or reschedule your appointment.   Please call the Suicide and Crisis Lifeline: 988 call the [place]  National Suicide Prevention Lifeline: 212-754-4332 or TTY: 343-345-1378 TTY 850-176-3315) to talk to a trained counselor call 1-800-273-TALK (toll free, 24 hour hotline) if you are experiencing a Mental Health or Behavioral Health Crisis or need someone to  talk to.  Arvin Seip RN, BSN, CCM [employer], Population Health Case Manager Phone: [phone]

## 2024-08-31 ENCOUNTER — Other Ambulatory Visit: Payer: Self-pay

## 2024-08-31 ENCOUNTER — Telehealth: Payer: Self-pay

## 2024-08-31 NOTE — Patient Instructions (Signed)
 Visit Information  Thank you for taking time to visit with me today. Please don't hesitate to contact me if I can be of assistance to you before our next scheduled appointment.  Your next care management appointment is by telephone on 09/28/2024 at 10:00 am  Telephone follow-up in 1 month  Please call the care guide team at [phone] if you need to cancel, schedule, or reschedule an appointment.   Please call the Suicide and Crisis Lifeline: 988 call the [place]  National Suicide Prevention Lifeline: 917 732 1543 or TTY: 318-018-7971 TTY (806)684-7969) to talk to a trained counselor call 1-800-273-TALK (toll free, 24 hour hotline) go to [facility] [address] [place] [phone]) call 911 if you are experiencing a Mental Health or Behavioral Health Crisis or need someone to talk to.  Nestora Duos, MSN, RN [facility], [facility] RN Care Manager Direct Dial: [phone] Fax: [phone]'  Atrial Fibrillation Atrial fibrillation (AFib) is a type of heartbeat that is irregular or fast. If you have AFib, your heart beats without any order. This makes it hard for your heart to pump blood in a normal way. AFib may come and go, or it may become a long-lasting problem. If AFib is not treated, it can put you at higher risk for stroke, heart failure, and other heart problems. What are the causes? AFib may be caused by diseases that damage the heart's electrical system. They include: High blood pressure. Heart failure. Heart valve diseases. Heart surgery. Diabetes. Thyroid  disease. Kidney disease. Lung diseases, such as pneumonia or COPD. Sleep apnea. Sometimes the cause is not known. What increases the risk? You are more likely to develop AFib if: You are older. You exercise often and very hard. You have a family history of AFib. You are female. You are Caucasian. You are overweight. You  smoke. You drink a lot of alcohol. What are the signs or symptoms? Common symptoms of this condition include: A feeling that your heart is beating very fast. Chest pain or discomfort. Feeling short of breath. Suddenly feeling light-headed or weak. Getting tired easily during activity. Fainting. Sweating. In some cases, there are no symptoms. How is this treated? Medicines to: Prevent blood clots. Treat heart rate or heart rhythm problems. Using devices, such as a pacemaker, to correct heart rhythm problems. Doing surgery to remove the part of the heart that sends bad signals. Closing an area where clots can form in the heart (left atrial appendage). In some cases, your doctor will treat other underlying conditions. Follow these instructions at home: Medicines Take over-the-counter and prescription medicines only as told by your doctor. Do not take any new medicines without first talking to your doctor. If you are taking blood thinners: Talk with your doctor before taking aspirin or NSAIDs, such as ibuprofen . Take your medicines as told. Take them at the same time each day. Do not do things that could hurt or bruise you. Be careful to avoid falls. Wear an alert bracelet or carry a card that says you take blood thinners. Lifestyle Do not smoke or use any products that contain nicotine or tobacco. If you need help quitting, ask your doctor. Eat heart-healthy foods. Talk with your doctor about the right eating plan for you. Exercise regularly as told by your doctor. Do not drink alcohol. Lose weight if you are overweight. General instructions If you have sleep apnea, treat it as told by your doctor. Do not use diet pills unless your doctor  says they are safe for you. Diet pills may make heart problems worse. Keep all follow-up visits. Your doctor will check your heart rate and rhythm regularly. Contact a doctor if: You notice a change in the speed, rhythm, or strength of your  heartbeat. You are taking a blood-thinning medicine and you get more bruising. You get tired more easily when you move or exercise. You have a sudden change in weight. Get help right away if:  You have pain in your chest. You have trouble breathing. You have side effects of blood thinners, such as blood in your vomit, poop (stool), or pee (urine), or bleeding that cannot stop. You have any signs of a stroke. BE FAST is an easy way to remember the main warning signs: B - Balance. Dizziness, sudden trouble walking, or loss of balance. E - Eyes. Trouble seeing or a change in how you see. F - Face. Sudden weakness or loss of feeling in the face. The face or eyelid may droop on one side. A - Arms.Weakness or loss of feeling in an arm. This happens suddenly and usually on one side of the body. S - Speech. Sudden trouble speaking, slurred speech, or trouble understanding what people say. T - Time.Time to call emergency services. Write down what time symptoms started. You have other signs of a stroke, such as: A sudden, very bad headache with no known cause. Feeling like you may vomit (nausea). Vomiting. A seizure. These symptoms may be an emergency. Get help right away. Call 911. Do not wait to see if the symptoms will go away. Do not drive yourself to the hospital. This information is not intended to replace advice given to you by your health care provider. Make sure you discuss any questions you have with your health care provider. Document Revised: 06/25/2022 Document Reviewed: 06/25/2022 Elsevier Patient Education  2024 [employer]   Cardioverter Defibrillator Implantation, Care After After surgery to get an implantable cardioverter defibrillator (ICD) put in, it's common to have some pain for a few days. You may also have a slight bump where the device was placed. You might be able to feel the device under your skin. This is normal. Follow these instructions at  home: Medicines Take over-the-counter and prescription medicines only as told by your health care provider. If you were prescribed antibiotics, take them as told by your provider. Do not stop taking the antibiotic even if you start to feel better. Bathing Do not take baths, swim, or use a hot tub until your provider approves. Ask your provider if you may take showers. You may only be allowed to take sponge baths. Cover your incision with a watertight covering if you take a sponge bath. Incision care  Follow instructions from your provider about how to take care of your incision. Make sure you: Wash your hands with soap and water for at least 20 seconds before and after you change your bandage. If soap and water are not available, use hand sanitizer. Change your bandage as told by your provider. Leave stitches, skin glue, tape strips, or staples in place. These skin closures may need to stay in place for 2 Milika Ventress or longer. If tape strip edges start to loosen and curl up, you may trim the loose edges. Do not remove tape strips completely unless your provider tells you to do that. Check your incision area every day for signs of infection. Check for: Redness, swelling, or more pain. Fluid or blood. Warmth. Pus or a  bad smell. Do not use lotions or ointments near the incision unless told by your provider. Do not wear tight clothes or clothes that could rub on your incision. Activity You may have to avoid lifting. Ask your provider how much you can safely lift. Return to your normal activities as told by your provider. Ask what activities are safe for you. Follow instructions about sports, exercise, work, and sexual activity after surgery. Do not sit for a long time without moving. Get up to take short walks every 1-2 hours. This will improve blood flow and breathing. Ask for help if you feel weak or unsteady. For at least 6 Ylonda Storr: Do not lift your upper arm above your shoulders. You may be given  a sling to use. This means no tennis, golf, or swimming for this period of time. If you tend to sleep with your arm above your head, use a sling or other restraint to keep from moving your arm during sleep. You should use your shoulder on the side of the ICD in daily tasks that don't require a lot of motion. Avoid sudden jerking, pulling, or chopping movements that pull your upper arm far away from your body. Do not drive or use machinery until your provider says that it's safe. Take part in a cardiac rehab program as told by your provider. This is a program that helps you exercise safely and learn ways to improve your heart health and well-being. Tourist information centre manager Tell all providers, including your dentist, that you have an ICD. Some medical devices and imaging tests like MRIs may affect your device. If you must pass through a metal detector, quickly walk through it. Do not stop under the detector. Do not stand near it. Avoid places or objects that have a strong electric or magnetic field. These include: [employer]. At the airport, let officials know that you have an ICD. Your ID card that shows you have an ICD will let you be checked in a way that's safe and won't damage your ICD. Do not let a security person wave a magnetic wand near your ICD. That can make it stop working. Power plants. Large electrical generators. Anti-theft systems or electronic article surveillance (EAS). Radiofrequency transmission towers, like mobile phone and radio towers. Name badges with magnetic clips. Some household devices and appliances may give off electric or magnetic waves that affect your ICD. If you're not sure if something is safe, ask your provider. When using your mobile phone, hold it to the ear that's on the opposite side from the ICD. Do not keep your phone in a pocket over the ICD. Many things have magnets in them, like headphones and e-cigarettes. Do not put them in a pocket  close to your ICD. If they're wearable, wear them as far away from your ICD as you can. Some devices are safe to use if they're held at least 12 inches (30 cm) away from your ICD. These include power tools, chain saws, lawn mowers, and speakers. It's safe to use smart watches, electric blankets, heating pads, computers, and microwaves. Do not use amateur or ham radio equipment, electric (arc) welding torches, magnetic mattresses or chairs, and scales that measure body fat. General instructions Do not use any products that contain nicotine or tobacco. These products include cigarettes, chewing tobacco, and vaping devices, such as e-cigarettes. If you need help quitting, ask your provider. Always keep your device ID card with you. The card should list the implant date, device  model, and manufacturer. Think about wearing a medical alert bracelet or necklace. Talk about specific rules for your device with the manufacturer or your provider. Get screened for depression and anxiety. Get treatment if needed. Keep all follow-up visits. Your provider needs to check your ICD every few months. The generator is monitored over time and will be replaced when the battery is wearing down. Most last for 5-7 years. Your provider may give you more instructions. Make sure you know what you can and can't do. Contact a health care provider if: You feel one shock in your chest. You quickly gain weight or have swelling in your legs or feet. You have a fever. You have swelling in your arm on the same side of your body as the ICD. Your heart feels like it's fluttering or skipping beats. You have any signs of infection in your incision. You feel depressed or anxious. You hear beeping sounds from your device. Get help right away if: You have chest pain. You feel more than one shock. You have trouble breathing. You feel dizzy or you faint. Your incision starts to open up. These symptoms may be an emergency. Get help  right away. Call 911. Do not wait to see if the symptoms will go away. Do not drive yourself to the hospital. This information is not intended to replace advice given to you by your health care provider. Make sure you discuss any questions you have with your health care provider. Document Revised: 12/25/2022 Document Reviewed: 12/25/2022 Elsevier Patient Education  2024 [employer].

## 2024-08-31 NOTE — Patient Outreach (Signed)
 Complex Care Management   Visit Note  08/31/2024  Name:  [name] MRN: [id number] DOB: [birth year]  Situation: Referral received for Complex Care Management related to RA, Falls I obtained verbal consent from Patient.  Visit completed with Patient  on the phone  Background:   Past Medical History:  Diagnosis Date   Allergic rhinitis    Cataract 2015   bilateral   COVID-19 10/2019   ASYMPTOMATIC   Diabetes mellitus without complication (HCC)    Diverticulosis    Esophageal reflux    Essential hypertension 01/07/2016   Fatty liver 2018   Fibula fracture 02/2005   Former smoker    Gastritis 2003   Heart murmur    MILD, NO CARDIOLOGIST   Hemorrhoids    History of cardiac dysrhythmia 2011   ABLATION  AT [facility]   History of kidney stones YRS AGO   Hyperglycemia    Hyperlipemia    Osteoarthritis    Personal history of colonic polyps 1998   hyperplastic   PMB (postmenopausal bleeding)    Pre-diabetes    BORDERLINE DIET CONTROLLED DOES NOT CHECK CBG   RA (rheumatoid arthritis) (HCC)    Rectal bleed 2018   TRANSFUSION GIVEN   Shingles 2012   Uterine fibroid     Assessment: Patient Reported Symptoms:  Cognitive Cognitive Status: No symptoms reported Cognitive/Intellectual Conditions Management [RPT]: None reported or documented in medical history or problem list   Health Maintenance Behaviors: Annual physical exam  Neurological Neurological Review of Symptoms: Headaches, Numbness Oher Neurological Symptoms/Conditions [RPT]: 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 - pain in toes, had headaches earlier in week but now resolved Neurological Management Strategies: Medication therapy, Routine screening  HEENT HEENT Symptoms Reported: Other: HEENT Comment: history throat surgery - sometimes issues drinking    Cardiovascular Other Cardiovascular Symptoms: defibrilator incision healed, no chest pain, reports has machine that will notify if defib activates, will call [employer] for  BP cuff, PT currently checking VS and reports good, reminded 11/14 [facility] at [facility] and remote pacer check 11/24, reviewed red flags for ED Does patient have uncontrolled Hypertension?: No Cardiovascular Management Strategies: Routine screening, Medication therapy  Respiratory Respiratory Symptoms Reported: Dry cough Other Respiratory Symptoms: occasional cough due to sinus issues, PND Respiratory Management Strategies: Routine screening, Medication therapy  Endocrine Endocrine Symptoms Reported: No symptoms reported Is patient diabetic?: Yes List most recent blood sugar readings, include date and time of day: no longer checking BG - A1C 5.4 denies feeling high/low, difficulty sticking finger, previously instructed    Gastrointestinal Gastrointestinal Symptoms Reported: No symptoms reported Additional Gastrointestinal Details: Miralax  - appetite normal      Genitourinary Genitourinary Symptoms Reported: No symptoms reported    Integumentary Integumentary Symptoms Reported: No symptoms reported Skin Management Strategies: Routine screening  Musculoskeletal Additional Musculoskeletal Details: tylenol  and kenalog  cream help but not for long, genrealized arthritic pain, worsening arthritis in hands limiting ability to cook Musculoskeletal Management Strategies: Medication therapy, Routine screening Musculoskeletal Comment: no recent falls Falls in the past year?: Yes Number of falls in past year: 2 or more Was there an injury with Fall?: Yes Fall Risk Category Calculator: 3 Patient Fall Risk Level: High Fall Risk Patient at Risk for Falls Due to: History of fall(s), Impaired balance/gait, Impaired mobility Fall risk Follow up: Falls evaluation completed, Falls prevention discussed  Psychosocial Psychosocial Symptoms Reported: No symptoms reported Additional Psychological Details: pain gets to her at times but husband very supportive and helpful, seeing Rheum in December Behavioral  Management Strategies: Support system Major Change/Loss/Stressor/Fears (CP): Medical condition, self Quality of Family Relationships: helpful Do you feel physically threatened by others?: No    08/31/2024    PHQ2-9 Depression Screening   Little interest or pleasure in doing things Not at all  Feeling down, depressed, or hopeless Not at all  PHQ-2 - Total Score 0  Trouble falling or staying asleep, or sleeping too much    Feeling tired or having little energy    Poor appetite or overeating     Feeling bad about yourself - or that you are a failure or have let yourself or your family down    Trouble concentrating on things, such as reading the newspaper or watching television    Moving or speaking so slowly that other people could have noticed.  Or the opposite - being so fidgety or restless that you have been moving around a lot more than usual    Thoughts that you would be better off dead, or hurting yourself in some way    PHQ2-9 Total Score    If you checked off any problems, how difficult have these problems made it for you to do your work, take care of things at home, or get along with other people    Depression Interventions/Treatment      There were no vitals filed for this visit.    Medications Reviewed Today     Reviewed by Devra Lands, RN (Registered Nurse) on 08/31/24 at 1039  Med List Status: <None>   Medication Order Taking? Sig Documenting Provider Last Dose Status Informant  ACCU-CHEK GUIDE TEST test strip [id number] Yes USE TO CHECK BLOOD SUGAR DAILY Tower, Laine LABOR, MD  Active Spouse/Significant Other, Pharmacy Records  Accu-Chek Softclix Lancets lancets [id number] Yes Check glucose level daily and as needed for diabetes type 2  (lancets for the soft clicks lancing device AccuChek meter) Tower, Laine LABOR, MD  Active Spouse/Significant Other, Pharmacy Records  acetaminophen  (TYLENOL ) 325 MG tablet [id number] Yes Take 650 mg by mouth every 6 (six) hours as needed.  [provider]  Active Spouse/Significant Other, Pharmacy Records  albuterol  (PROVENTIL  HFA;VENTOLIN  HFA) 108 (90 Base) MCG/ACT inhaler [id number] Yes Inhale 2 puffs into the lungs every 4 (four) hours as needed for wheezing or shortness of breath. Please give spacer if possible Tower, Laine LABOR, MD  Active Spouse/Significant Other, Pharmacy Records  amLODipine  (NORVASC ) 5 MG tablet [id number] Yes TAKE 1 TABLET (5 MG TOTAL) BY MOUTH DAILY. Tower, Laine LABOR, MD  Active Spouse/Significant Other, Pharmacy Records  atorvastatin  (LIPITOR) 10 MG tablet [id number] Yes TAKE 0.5 TABLETS (5 MG TOTAL) BY MOUTH EVERY OTHER DAY.  Patient taking differently: Takes occasionally - about 2-3x week per patient   Tower, Laine LABOR, MD  Active Spouse/Significant Other, Pharmacy Records           Med Note (WHITE, DONETA RAMAN   Fri Jul 29, 2024 10:06 PM) Taken occasionally  blood glucose meter kit and supplies [id number] Yes To check glucose daily and prn for DM2 E11.9 Tower, Laine LABOR, MD  Active Spouse/Significant Other, Pharmacy Records  Blood Pressure Monitoring (ADULT BLOOD PRESSURE CUFF LG) KIT [id number]  Blood pressure cuff size large for the arm to check blood pressure daily and as needed for HTN Tower, Laine LABOR, MD  Active Spouse/Significant Other, Pharmacy Records  EPINEPHrine [facility] IJ) [id number] Yes Inject as directed as needed. Reported on 12/05/2015 [provider]  Active Spouse/Significant Other, Pharmacy Records  fluticasone  (FLONASE ) 50 MCG/ACT nasal  spray [id number] Yes PLACE 2 SPRAYS INTO THE NOSE DAILY.  Patient taking differently: PLACE 2 SPRAYS INTO THE NOSE DAILY. Reports as needed   Tower, Laine LABOR, MD  Active Spouse/Significant Other, Pharmacy Records           Med Note (WHITE, DONETA RAMAN   Fri Jul 29, 2024 10:08 PM)    folic acid  (FOLVITE ) 1 MG tablet [id number] Yes Take 1 mg by mouth daily. [provider]  Active Spouse/Significant Other, Pharmacy Records  glipiZIDE  (GLUCOTROL  XL) 2.5 MG  24 hr tablet [id number] Yes TAKE 1 TABLET BY MOUTH DAILY WITH BREAKFAST. Tower, Laine LABOR, MD  Active   hydroxychloroquine (PLAQUENIL) 200 MG tablet [id number] Yes Take 200 mg by mouth daily. [provider]  Active Spouse/Significant Other, Pharmacy Records  loratadine  (CLARITIN ) 10 MG tablet [id number] Yes Take 10 mg by mouth daily as needed for allergies. [provider]  Active Spouse/Significant Other, Pharmacy Records  methotrexate (RHEUMATREX) 2.5 MG tablet [id number] Yes Take 17.5 mg by mouth once a week. Caution:Chemotherapy. Protect from light. ( Tuesday of each week ) [provider]  Active Spouse/Significant Other, Pharmacy Records  pantoprazole  (PROTONIX ) 40 MG tablet [id number] Yes TAKE 1 TABLET BY MOUTH EVERY DAY Tower, Laine LABOR, MD  Active   polyethylene glycol (MIRALAX  / GLYCOLAX ) packet [id number] Yes Take 17 g by mouth daily. [provider]  Active Spouse/Significant Other, Pharmacy Records  triamcinolone  cream (KENALOG ) 0.1 % [id number] Yes Apply 1 Application topically 2 (two) times daily as needed. On rash/affected area Tower, Laine LABOR, MD  Active Spouse/Significant Other, Pharmacy Records            Recommendation:   PCP Follow-up Specialty provider follow-up as discussed Continue Current Plan of Care  Follow Up Plan:   Telephone follow-up in 1 month  Nestora Duos, MSN, RN [facility]  [facility], [facility] RN Care Manager Direct Dial: [phone] Fax: [phone]

## 2024-08-31 NOTE — Patient Instructions (Addendum)
 Visit Information  Thank you for taking time to visit with me today. You have completed your 30 day TOC program and your goals have been met.   Following is a copy of your care plan:   Goals Addressed             This Visit's Progress    COMPLETED: VBCI Transitions of Care (TOC) Care Plan       Patient has completed the 30 day program.  Goals met.  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  Reviewed upcoming provider visits- patient scheduled for atrial fibrillation clinic on 09/02/24.   Assessed for new/ ongoing symptoms.  Medications reviewed and compliance discussed  Assessed pain level Assessed for ongoing home health services Advised to monitor incision site for redness, drainage, swelling, skin being hot to touch or fever.  Notify provider immediately for these symptoms Reinforced: Do not lift, push, pull, or carry anything over 10 pounds with the affected arm until 6 weeks (Tuesday September 13, 2024 ) after your procedure.  Reviewed and assessed for atrial fibrillation symptoms.  Advised to call 911 for severe shortness of breath or chest pain Discussed and offered ongoing longitudinal nurse case manager follow up.  Patient verbally agreed to ongoing follow up with assigned case manager Nestora Duos, RN   Patient Self Care Activities:  Call pharmacy for medication refills 3-7 days in advance of running out of medications Call provider office for new concerns or questions  Take medications as prescribed   Do not lift, push, pull, or carry anything over 10 pounds with the affected arm until 6 weeks (Tuesday September 13, 2024 ) after your procedure.  Notify cardiologist for  atrial fibrillation symptoms: heart palpitations, shortness of breath, fatigue, weakness, dizziness, lightheadedness Consider getting arm blood pressure monitor.  Call 911 for severe shortness of breath or chest pain  Plan:  No further follow up required: patient has completed the 30 day TOC program        Patient verbalizes understanding of instructions and care plan provided today and agrees to view in MyChart. Active MyChart status and patient understanding of how to access instructions and care plan via MyChart confirmed with patient.     The patient has been provided with contact information for the care management team and has been advised to call with any health related questions or concerns.   Please call the care guide team at [phone] if you need to cancel or reschedule your appointment.   Please call the Suicide and Crisis Lifeline: 988 call the [place]  National Suicide Prevention Lifeline: 817-752-6460 or TTY: 252-396-9829 TTY 209-187-8467) to talk to a trained counselor call 1-800-273-TALK (toll free, 24 hour hotline) if you are experiencing a Mental Health or Behavioral Health Crisis or need someone to talk to.  Arvin Seip RN, BSN, CCM [employer], Population Health Case Manager Phone: [phone]

## 2024-08-31 NOTE — Transitions of Care (Post Inpatient/ED Visit) (Signed)
 Transition of Care week #5  Visit Note  08/31/2024  Name: [name] MRN: [id number]          DOB: [birth year]  Situation: Patient enrolled in [facility] 30-day program. Visit completed with patient by telephone.   Background:   Initial Transition Care Management Follow-up Telephone Call Discharge Date and Diagnosis: 08/02/24, bradycardia/ SVT   Past Medical History:  Diagnosis Date   Allergic rhinitis    Cataract 2015   bilateral   COVID-19 10/2019   ASYMPTOMATIC   Diabetes mellitus without complication (HCC)    Diverticulosis    Esophageal reflux    Essential hypertension 01/07/2016   Fatty liver 2018   Fibula fracture 02/2005   Former smoker    Gastritis 2003   Heart murmur    MILD, NO CARDIOLOGIST   Hemorrhoids    History of cardiac dysrhythmia 2011   ABLATION  AT [facility]   History of kidney stones YRS AGO   Hyperglycemia    Hyperlipemia    Osteoarthritis    Personal history of colonic polyps 1998   hyperplastic   PMB (postmenopausal bleeding)    Pre-diabetes    BORDERLINE DIET CONTROLLED DOES NOT CHECK CBG   RA (rheumatoid arthritis) (HCC)    Rectal bleed 2018   TRANSFUSION GIVEN   Shingles 2012   Uterine fibroid     Assessment: Patient Reported Symptoms: Cognitive Cognitive Status: Alert and oriented to person, place, and time, Insightful and able to interpret abstract concepts, Normal speech and language skills      Neurological Neurological Review of Symptoms: No symptoms reported    HEENT HEENT Symptoms Reported: No symptoms reported      Cardiovascular Cardiovascular Symptoms Reported: Other: Other Cardiovascular Symptoms: patient denies any symptoms. She states she has a follow up appointment with the cardiologist on 09/02/24 and a pacemaker check on 09/12/24.  She reports incision site has healed. Patient states she is still not monitoring her blood pressure or pulse.    Respiratory Respiratory Symptoms Reported: No symptoms reported     Endocrine Endocrine Symptoms Reported: No symptoms reported    Gastrointestinal Gastrointestinal Symptoms Reported: No symptoms reported      Genitourinary Genitourinary Symptoms Reported: No symptoms reported    Integumentary Integumentary Symptoms Reported: Other Additional Integumentary Details: patient reports pacemaker surgical site has healed.    Musculoskeletal Musculoskelatal Symptoms Reviewed: Other Additional Musculoskeletal Details: patient reports using rollator for ambulation        Psychosocial Psychosocial Symptoms Reported: No symptoms reported         There were no vitals filed for this visit. Pain Scale: 0-10 Pain Score: 3  Pain Type: Chronic pain Pain Location:  (patient reports having generalized arthritis pain) Pain Descriptors / Indicators: Aching Pain Onset: On-going Patients Stated Pain Goal: 0 Pain Intervention(s): Medication (See eMAR)  Medications Reviewed Today     Reviewed by Matika Bartell E, RN (Registered Nurse) on 08/31/24 at 1144  Med List Status: <None>   Medication Order Taking? Sig Documenting Provider Last Dose Status Informant  ACCU-CHEK GUIDE TEST test strip [id number] Yes USE TO CHECK BLOOD SUGAR DAILY Tower, Laine LABOR, MD  Active Spouse/Significant Other, Pharmacy Records  Accu-Chek Softclix Lancets lancets [id number] Yes Check glucose level daily and as needed for diabetes type 2  (lancets for the soft clicks lancing device AccuChek meter) Tower, Laine LABOR, MD  Active Spouse/Significant Other, Pharmacy Records  acetaminophen  (TYLENOL ) 325 MG tablet [id number] Yes Take 650 mg by mouth every 6 (six)  hours as needed. [provider]  Active Spouse/Significant Other, Pharmacy Records  albuterol  (PROVENTIL  HFA;VENTOLIN  HFA) 108 (90 Base) MCG/ACT inhaler [id number] Yes Inhale 2 puffs into the lungs every 4 (four) hours as needed for wheezing or shortness of breath. Please give spacer if possible Tower, Laine LABOR, MD  Active Spouse/Significant  Other, Pharmacy Records  amLODipine  (NORVASC ) 5 MG tablet [id number] Yes TAKE 1 TABLET (5 MG TOTAL) BY MOUTH DAILY. Tower, Laine LABOR, MD  Active Spouse/Significant Other, Pharmacy Records  atorvastatin  (LIPITOR) 10 MG tablet [id number] Yes TAKE 0.5 TABLETS (5 MG TOTAL) BY MOUTH EVERY OTHER DAY.  Patient taking differently: Takes occasionally - about 2-3x week per patient   Tower, Laine LABOR, MD  Active Spouse/Significant Other, Pharmacy Records           Med Note (WHITE, DONETA RAMAN   Fri Jul 29, 2024 10:06 PM) Taken occasionally  blood glucose meter kit and supplies [id number] Yes To check glucose daily and prn for DM2 E11.9 Tower, Laine LABOR, MD  Active Spouse/Significant Other, Pharmacy Records  Blood Pressure Monitoring (ADULT BLOOD PRESSURE CUFF LG) KIT [id number] Yes Blood pressure cuff size large for the arm to check blood pressure daily and as needed for HTN Tower, Laine LABOR, MD  Active Spouse/Significant Other, Pharmacy Records  EPINEPHrine [facility] IJ) [id number] Yes Inject as directed as needed. Reported on 12/05/2015 [provider]  Active Spouse/Significant Other, Pharmacy Records  fluticasone  (FLONASE ) 50 MCG/ACT nasal spray [id number] Yes PLACE 2 SPRAYS INTO THE NOSE DAILY.  Patient taking differently: PLACE 2 SPRAYS INTO THE NOSE DAILY. Reports as needed   Tower, Laine LABOR, MD  Active Spouse/Significant Other, Pharmacy Records           Med Note (WHITE, DONETA RAMAN   Fri Jul 29, 2024 10:08 PM)    folic acid  (FOLVITE ) 1 MG tablet [id number] Yes Take 1 mg by mouth daily. [provider]  Active Spouse/Significant Other, Pharmacy Records  glipiZIDE  (GLUCOTROL  XL) 2.5 MG 24 hr tablet [id number] Yes TAKE 1 TABLET BY MOUTH DAILY WITH BREAKFAST. Tower, Laine LABOR, MD  Active   hydroxychloroquine (PLAQUENIL) 200 MG tablet [id number] Yes Take 200 mg by mouth daily. [provider]  Active Spouse/Significant Other, Pharmacy Records  loratadine  (CLARITIN ) 10 MG tablet [id number] Yes Take 10 mg by mouth  daily as needed for allergies. [provider]  Active Spouse/Significant Other, Pharmacy Records  methotrexate (RHEUMATREX) 2.5 MG tablet [id number] Yes Take 17.5 mg by mouth once a week. Caution:Chemotherapy. Protect from light. ( Tuesday of each week ) [provider]  Active Spouse/Significant Other, Pharmacy Records  pantoprazole  (PROTONIX ) 40 MG tablet [id number] Yes TAKE 1 TABLET BY MOUTH EVERY DAY Tower, Laine LABOR, MD  Active   polyethylene glycol (MIRALAX  / GLYCOLAX ) packet [id number] Yes Take 17 g by mouth daily. [provider]  Active Spouse/Significant Other, Pharmacy Records  triamcinolone  cream (KENALOG ) 0.1 % [id number] Yes Apply 1 Application topically 2 (two) times daily as needed. On rash/affected area Tower, Laine LABOR, MD  Active Spouse/Significant Other, Pharmacy Records            Recommendation:   Continue Current Plan of Care  Follow Up Plan:   Closing From:  Transitions of Care Program goals met.  Patient to have ongoing follow up with Nestora Duos, RN case manager   Arvin Seip RN, BSN, CCM [place]  [facility], Population Health Case Manager Phone: [phone]

## 2024-09-02 ENCOUNTER — Ambulatory Visit (HOSPITAL_COMMUNITY)
Admission: RE | Admit: 2024-09-02 | Discharge: 2024-09-02 | Disposition: A | Discharge status: Home / Self Care | Source: Ambulatory Visit | Attending: Physician Assistant | Admitting: Physician Assistant

## 2024-09-02 VITALS — BP 152/80 | HR 100 | Ht 67.0 in | Wt 148.4 lb

## 2024-09-02 DIAGNOSIS — I4891 Unspecified atrial fibrillation: Secondary | ICD-10-CM | POA: Diagnosis not present

## 2024-09-02 DIAGNOSIS — I480 Paroxysmal atrial fibrillation: Secondary | ICD-10-CM

## 2024-09-02 DIAGNOSIS — D6869 Other thrombophilia: Secondary | ICD-10-CM | POA: Diagnosis not present

## 2024-09-02 MED ORDER — APIXABAN 5 MG PO TABS: 5.0000 mg | ORAL_TABLET | Freq: Two times a day (BID) | ORAL | 3 refills | Status: DC

## 2024-09-02 NOTE — Progress Notes (Signed)
 Primary Care Physician: Tower, Laine LABOR, MD Primary Cardiologist: None Electrophysiologist: Will Gladis Norton, MD  Referring Physician: Dr Norton Macintosh MIZANI DILDAY is a 83 y.o. 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 She was working with home PT on 07/29/24 and was noted to have significantly elevated blood pressures. On arrival to urgent care, she was found to be significantly bradycardic and hypertensive. Heart rates were in the 30s. She was therefore sent to the emergency room for further evaluation. EP was consulted and she is s/p PPM implant. The device clinic received alerts for several brief afib episodes, longest lasting ~2.5 hours, V rates controlled.    Patient presents today for follow up for atrial fibrillation. She is in SR today. She was unaware of her arrhythmia at the time. She does feel improved since having the PPM implanted.   Today, she denies symptoms of palpitations, chest pain, shortness of breath, orthopnea, PND, lower extremity edema, dizziness, presyncope, syncope, snoring, daytime somnolence, bleeding, or neurologic sequela. The patient is tolerating medications without difficulties and is otherwise without complaint today.    Atrial Fibrillation Risk Factors:  she does not have symptoms or diagnosis of sleep apnea. she does not have a history of rheumatic fever. she does not have a history of alcohol use. The patient does not have a history of early familial atrial fibrillation or other arrhythmias.  Atrial Fibrillation Management history:  Previous antiarrhythmic drugs: none Previous cardioversions: none Previous ablations: none Anticoagulation history: remotely   ROS- All systems are reviewed and negative except as per the HPI above.  Past Medical History:  Diagnosis Date   Allergic rhinitis     Cataract 2015   bilateral   COVID-19 10/2019   ASYMPTOMATIC   Diabetes mellitus without complication (HCC)    Diverticulosis    Esophageal reflux    Essential hypertension 01/07/2016   Fatty liver 2018   Fibula fracture 02/2005   Former smoker    Gastritis 2003   Heart murmur    MILD, NO CARDIOLOGIST   Hemorrhoids    History of cardiac dysrhythmia 2011   ABLATION  AT [facility]   History of kidney stones YRS AGO   Hyperglycemia    Hyperlipemia    Osteoarthritis    Personal history of colonic polyps 1998   hyperplastic   PMB (postmenopausal bleeding)    Pre-diabetes    BORDERLINE DIET CONTROLLED DOES NOT CHECK CBG   RA (rheumatoid arthritis) (HCC)    Rectal bleed 2018   TRANSFUSION GIVEN   Shingles 2012   Uterine fibroid     Current Outpatient Medications  Medication Sig Dispense Refill   ACCU-CHEK GUIDE TEST test strip USE TO CHECK BLOOD SUGAR DAILY 100 strip 1   Accu-Chek Softclix Lancets lancets Check glucose level daily and as needed for diabetes type 2  (lancets for the soft clicks lancing device AccuChek meter) 100 each 3   acetaminophen  (TYLENOL ) 325 MG tablet Take 650 mg by mouth every 6 (six) hours as needed.     albuterol  (PROVENTIL  HFA;VENTOLIN  HFA) 108 (90 Base) MCG/ACT inhaler Inhale 2 puffs into the lungs every 4 (four) hours as needed for wheezing or shortness of breath. Please give spacer if possible 1 Inhaler 3   amLODipine  (NORVASC ) 5 MG tablet TAKE 1 TABLET (5 MG TOTAL) BY MOUTH  DAILY. 90 tablet 1   atorvastatin  (LIPITOR) 10 MG tablet TAKE 0.5 TABLETS (5 MG TOTAL) BY MOUTH EVERY OTHER DAY. (Patient taking differently: Takes occasionally - about 2-3x week per patient) 22 tablet 3   blood glucose meter kit and supplies To check glucose daily and prn for DM2 E11.9 1 each 0   Blood Pressure Monitoring (ADULT BLOOD PRESSURE CUFF LG) KIT Blood pressure cuff size large for the arm to check blood pressure daily and as needed for HTN 1 kit 0   EPINEPHrine (EPIPEN  IJ) Inject as directed as needed. Reported on 12/05/2015     fluticasone  (FLONASE ) 50 MCG/ACT nasal spray PLACE 2 SPRAYS INTO THE NOSE DAILY. (Patient taking differently: PLACE 2 SPRAYS INTO THE NOSE DAILY. Reports as needed) 48 g 3   folic acid  (FOLVITE ) 1 MG tablet Take 1 mg by mouth daily.     glipiZIDE  (GLUCOTROL  XL) 2.5 MG 24 hr tablet TAKE 1 TABLET BY MOUTH DAILY WITH BREAKFAST. 90 tablet 1   hydroxychloroquine (PLAQUENIL) 200 MG tablet Take 200 mg by mouth daily.     loratadine  (CLARITIN ) 10 MG tablet Take 10 mg by mouth daily as needed for allergies.     methotrexate (RHEUMATREX) 2.5 MG tablet Take 17.5 mg by mouth once a week. Caution:Chemotherapy. Protect from light. ( Tuesday of each week )     pantoprazole  (PROTONIX ) 40 MG tablet TAKE 1 TABLET BY MOUTH EVERY DAY 90 tablet 0   polyethylene glycol (MIRALAX  / GLYCOLAX ) packet Take 17 g by mouth daily.     triamcinolone  cream (KENALOG ) 0.1 % Apply 1 Application topically 2 (two) times daily as needed. On rash/affected area 15 g 0   No current facility-administered medications for this visit.    Physical Exam: There were no vitals taken for this visit.  GEN: Well nourished, well developed in no acute distress CARDIAC: Regular rate and rhythm, no murmurs, rubs, gallops RESPIRATORY:  Clear to auscultation without rales, wheezing or rhonchi  ABDOMEN: Soft, non-tender, non-distended EXTREMITIES:  No edema; No deformity   Wt Readings from Last 3 Encounters:  08/09/24 70.5 kg  07/29/24 69.4 kg  07/22/24 69.4 kg     EKG today demonstrates  A sense V paced rhythm Vent. rate 100 BPM PR interval 170 ms QRS duration 132 ms QT/QTcB 382/492 ms   Echo 07/31/24 demonstrated  1. Left ventricular ejection fraction, by estimation, is 65 to 70%. The  left ventricle has normal function. The left ventricle has no regional  wall motion abnormalities. There is mild concentric left ventricular  hypertrophy. Left ventricular diastolic  parameters are indeterminate.   2. Right ventricular systolic function is normal. The right ventricular  size is normal. There is normal pulmonary artery systolic pressure. The  estimated right ventricular systolic pressure is 32.6 mmHg.   3. Left atrial size was mildly dilated.   4. The mitral valve is grossly normal. Mild to moderate mitral valve  regurgitation.   5. The aortic valve is tricuspid. There is moderate calcification of the  aortic valve. Aortic valve regurgitation is mild. Aortic valve mean  gradient measures 5.0 mmHg.   6. The inferior vena cava is normal in size with <50% respiratory  variability, suggesting right atrial pressure of 8 mmHg.    CHA2DS2-VASc Score = 6  The patient's score is based upon: CHF History: 0 HTN History: 1 Diabetes History: 1 Stroke History: 0 Vascular Disease History: 1 Age Score: 2 Gender Score: 1  ASSESSMENT AND PLAN: Paroxysmal Atrial Fibrillation (ICD10:  I48.0) The patient's CHA2DS2-VASc score is 6, indicating a 9.7% annual risk of stroke.   General education about afib provided and questions answered. We also discussed her stroke risk and the risks and benefits of anticoagulation. Will start Eliquis 5 mg BID, recent lab work reviewed.  Will not start AV nodal agent as her V rates have been controlled.  Continue to monitor afib burden on device.   Secondary Hypercoagulable State (ICD10:  D68.69) The patient is at significant risk for stroke/thromboembolism based upon her CHA2DS2-VASc Score of 6.  Start Apixaban (Eliquis). No bleeding issues.   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     [facility] [address] [place], [place] [address] [phone]

## 2024-09-02 NOTE — Patient Instructions (Signed)
 Start Eliquis 5mg  twice a day

## 2024-09-04 ENCOUNTER — Telehealth: Payer: Self-pay | Admitting: Family Medicine

## 2024-09-04 DIAGNOSIS — M069 Rheumatoid arthritis, unspecified: Secondary | ICD-10-CM

## 2024-09-04 DIAGNOSIS — M1990 Unspecified osteoarthritis, unspecified site: Secondary | ICD-10-CM

## 2024-09-04 NOTE — Telephone Encounter (Signed)
 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 If would qualify for [facility] aide   From nurse Devra Lands, RN  Angelino Rumery, Laine LABOR, MD Does it give you an option for OT eval since issue with hands? And then in comments - evaluate need for HHA. Thank you - she reported her ability to do things I her home has really declined and it frustrates her a lot.   The order is in for [facility]

## 2024-09-06 ENCOUNTER — Telehealth: Payer: Self-pay | Admitting: *Deleted

## 2024-09-06 NOTE — Telephone Encounter (Signed)
 Copied from CRM [id number]. Topic: Clinical - Prescription Issue >> Sep 06, 2024 10:53 AM Aleatha BROCKS wrote: Reason for CRM: Patient daughter was going through her medications and she seen that about 4 of her medications causes  heart rhythm problems, bone fractures as well as Weakness and rapid weight loss she would like the Dr tower to give her a call to discuss further call back #[phone]

## 2024-09-06 NOTE — Telephone Encounter (Signed)
 Thanks for the heads up  Please tell me what medications she is concerned with and will look into it

## 2024-09-07 NOTE — Telephone Encounter (Addendum)
**  daughter is still asking if PCP can call her back** [phone]**  Daughter [name] is concerned because pt's cardiologist called her because pt was in a-fib for over 2 hrs and they had to bring her in the office and give her meds to get her out of a-fib. Pt is also severely weak is having muscle pain and has loss a lot of weight daughter said she's 144 lbs now, and not very mobile. Given pt's recent decline daughter decided to go through her meds to see the side eff to see if they are causing some of these sxs. Pt said she only went through 4 so far but plans to go through all of her meds. Listed below are the meds daughter looked up and the side eff it causes that pt is having now.  Hydroxychloroquine: causes heart rate problem, no appetite, stomach pain, muscle weakness  Protonix : bone issues/fractures, low magnesium , low B12  Glipizide : fast heartbeat, HA, sweating (daughter ? If still needs to take it, she wants her off of it given weight loss if possible)  Lipitor: muscle and joint pain, increase risk for heart attacks/issues, weakness (not a sxs she has but it also increases stroke risk and that worries daughter)  Daughter is asking if PCP can review and call her back and let her know what she thinks about these meds and pt's overall health

## 2024-09-07 NOTE — Telephone Encounter (Signed)
 Daughter states she was told to arrive in office to Pick up a DPR and take home to have mom sign so she can speak on her behalf.  Advised that she would have to complete in office.

## 2024-09-08 NOTE — Telephone Encounter (Signed)
 Let me know when we have a DPR, thanks   Routing back to myself as well

## 2024-09-09 ENCOUNTER — Telehealth: Payer: Self-pay | Admitting: Family Medicine

## 2024-09-09 NOTE — Telephone Encounter (Signed)
 Copied from CRM [id number]. Topic: Clinical - Medication Question >> Sep 09, 2024  8:52 AM Larissa RAMAN wrote: Reason for CRM: Patient has questions regarding her medications, declined to provide additional details.

## 2024-09-12 ENCOUNTER — Ambulatory Visit

## 2024-09-12 DIAGNOSIS — I4891 Unspecified atrial fibrillation: Secondary | ICD-10-CM

## 2024-09-13 LAB — CUP PACEART REMOTE DEVICE CHECK
Battery Remaining Longevity: 123 mo
Battery Remaining Percentage: 95.5 %
Battery Voltage: 3.05 V
Brady Statistic AP VP Percent: 3.8 %
Brady Statistic AP VS Percent: 1 %
Brady Statistic AS VP Percent: 96 %
Brady Statistic AS VS Percent: 1 %
Brady Statistic RA Percent Paced: 3.7 %
Brady Statistic RV Percent Paced: 99 %
Date Time Interrogation Session: 20251124025239
Implantable Lead Connection Status: 753985
Implantable Lead Connection Status: 753985
Implantable Lead Implant Date: 20251013
Implantable Lead Implant Date: 20251013
Implantable Lead Location: 753859
Implantable Lead Location: 753860
Implantable Pulse Generator Implant Date: 20251013
Lead Channel Impedance Value: 400 Ohm
Lead Channel Impedance Value: 480 Ohm
Lead Channel Pacing Threshold Amplitude: 0.75 V
Lead Channel Pacing Threshold Amplitude: 0.75 V
Lead Channel Pacing Threshold Pulse Width: 0.5 ms
Lead Channel Pacing Threshold Pulse Width: 0.5 ms
Lead Channel Sensing Intrinsic Amplitude: 5 mV
Lead Channel Sensing Intrinsic Amplitude: 9.3 mV
Lead Channel Setting Pacing Amplitude: 1 V
Lead Channel Setting Pacing Amplitude: 1.75 V
Lead Channel Setting Pacing Pulse Width: 0.5 ms
Lead Channel Setting Sensing Sensitivity: 2 mV
Pulse Gen Model: 2272
Pulse Gen Serial Number: 8302199

## 2024-09-14 ENCOUNTER — Ambulatory Visit: Payer: Self-pay | Admitting: Cardiology

## 2024-09-14 NOTE — Progress Notes (Signed)
 Remote PPM Transmission

## 2024-09-28 ENCOUNTER — Other Ambulatory Visit: Payer: Self-pay

## 2024-09-28 NOTE — Patient Instructions (Signed)
 Visit Information  Thank you for taking time to visit with me today. Please don't hesitate to contact me if I can be of assistance to you before our next scheduled appointment.  Your next care management appointment is by telephone on 11/03/23 at 11:00am  Telephone follow-up in 1 month  Please call the care guide team at [phone] if you need to cancel, schedule, or reschedule an appointment.   Please call the Suicide and Crisis Lifeline: 988 call the [place]  National Suicide Prevention Lifeline: 513-397-3263 or TTY: 469-807-9628 TTY (951)363-4520) to talk to a trained counselor call 1-800-273-TALK (toll free, 24 hour hotline) if you are experiencing a Mental Health or Behavioral Health Crisis or need someone to talk to.  Nichael Ehly RN RN Care Manager [facility] [phone]

## 2024-09-28 NOTE — Patient Outreach (Signed)
 Complex Care Management   Visit Note  09/28/2024  Name:  [name] MRN: [id number] DOB: 06-20-1941  Situation: Referral received for Complex Care Management related to Diabetes and osteoarthritis I obtained verbal consent from Patient.  Visit completed with Patient  on the phone  Background:   Past Medical History:  Diagnosis Date   Allergic rhinitis    Cataract 2015   bilateral   COVID-19 10/2019   ASYMPTOMATIC   Diabetes mellitus without complication (HCC)    Diverticulosis    Esophageal reflux    Essential hypertension 01/07/2016   Fatty liver 2018   Fibula fracture 02/2005   Former smoker    Gastritis 2003   Heart murmur    MILD, NO CARDIOLOGIST   Hemorrhoids    History of cardiac dysrhythmia 2011   ABLATION  AT [facility]   History of kidney stones YRS AGO   Hyperglycemia    Hyperlipemia    Osteoarthritis    Personal history of colonic polyps 1998   hyperplastic   PMB (postmenopausal bleeding)    Pre-diabetes    BORDERLINE DIET CONTROLLED DOES NOT CHECK CBG   RA (rheumatoid arthritis) (HCC)    Rectal bleed 2018   TRANSFUSION GIVEN   Shingles 2012   Uterine fibroid     Assessment: Patient Reported Symptoms:  Cognitive Cognitive Status: Able to follow simple commands, Alert and oriented to person, place, and time   Health Maintenance Behaviors: Annual physical exam Healing Pattern: Average  Neurological Neurological Review of Symptoms: Numbness Oher Neurological Symptoms/Conditions [RPT]: numbness in toes and bottom of feet Neurological Management Strategies: Adequate rest, Routine screening Neurological Self-Management Outcome: 4 (good)  HEENT HEENT Symptoms Reported: Frequent sneezing HEENT Management Strategies: Routine screening, Medication therapy HEENT Self-Management Outcome: 4 (good) HEENT Comment: seasonal allergies    Cardiovascular Cardiovascular Symptoms Reported: No symptoms reported Does patient have uncontrolled Hypertension?:  No Cardiovascular Management Strategies: Routine screening Weight: 143 lb (64.9 kg) Cardiovascular Self-Management Outcome: 4 (good)  Respiratory Other Respiratory Symptoms: sneezing Respiratory Management Strategies: Routine screening Respiratory Self-Management Outcome: 4 (good)  Endocrine Endocrine Symptoms Reported: No symptoms reported Is patient diabetic?: Yes Is patient checking blood sugars at home?: No List most recent blood sugar readings, include date and time of day: reports she is getting instruction at next appt. Endocrine Self-Management Outcome: 3 (uncertain)  Gastrointestinal Gastrointestinal Symptoms Reported: No symptoms reported Additional Gastrointestinal Details: takes mirilax Gastrointestinal Management Strategies: Adequate rest Gastrointestinal Self-Management Outcome: 3 (uncertain) Gastrointestinal Comment: poor appetite Nutrition Risk Screen (CP): Reduced oral intake over the last month, Unintentional loss of 10 lbs or more in the past 2 months  Genitourinary Genitourinary Symptoms Reported: No symptoms reported Genitourinary Management Strategies: Adequate rest, Incontinence garment/pad Genitourinary Self-Management Outcome: 4 (good)  Integumentary Integumentary Symptoms Reported: No symptoms reported Skin Management Strategies: Routine screening Skin Self-Management Outcome: 4 (good)  Musculoskeletal Musculoskelatal Symptoms Reviewed: Joint pain Additional Musculoskeletal Details: severe joint pain 9/10 taking tylenol  Musculoskeletal Management Strategies: Medication therapy, Coping strategies Musculoskeletal Self-Management Outcome: 4 (good) Falls in the past year?: No Number of falls in past year: 1 or less Was there an injury with Fall?: No Fall Risk Category Calculator: 0 Patient Fall Risk Level: Low Fall Risk Patient at Risk for Falls Due to: History of fall(s) Fall risk Follow up: Falls evaluation completed, Education provided, Falls prevention  discussed  Psychosocial Psychosocial Symptoms Reported: No symptoms reported Behavioral Management Strategies: Support system Behavioral Health Self-Management Outcome: 4 (good) Major Change/Loss/Stressor/Fears (CP): Medical condition, self Behaviors When  Feeling Stressed/Fearful: pray Quality of Family Relationships: supportive, involved Do you feel physically threatened by others?: No    09/28/2024    PHQ2-9 Depression Screening   Little interest or pleasure in doing things Not at all  Feeling down, depressed, or hopeless Not at all  PHQ-2 - Total Score 0  Trouble falling or staying asleep, or sleeping too much More than half the days  Feeling tired or having little energy Nearly every day  Poor appetite or overeating  Nearly every day  Feeling bad about yourself - or that you are a failure or have let yourself or your family down More than half the days  Trouble concentrating on things, such as reading the newspaper or watching television Several days  Moving or speaking so slowly that other people could have noticed.  Or the opposite - being so fidgety or restless that you have been moving around a lot more than usual Not at all  Thoughts that you would be better off dead, or hurting yourself in some way Not at all  PHQ2-9 Total Score 11  If you checked off any problems, how difficult have these problems made it for you to do your work, take care of things at home, or get along with other people Very difficult  Depression Interventions/Treatment Patient refuses Treatment    Today's Vitals   09/28/24 1014  Weight: 143 lb (64.9 kg)   Pain Scale: 0-10 Pain Score: 8  Pain Type: Chronic pain Pain Location: Foot Pain Orientation: Left, Right Pain Descriptors / Indicators: Aching, Crying Pain Onset: On-going Patients Stated Pain Goal: 0 Pain Intervention(s): Hot/Cold interventions Multiple Pain Sites: No  Medications Reviewed Today     Reviewed by Nivia, Ripley Lovecchio , RN  (Registered Nurse) on 09/28/24 at 1008  Med List Status: <None>   Medication Order Taking? Sig Documenting Provider Last Dose Status Informant  ACCU-CHEK GUIDE TEST test strip [id number] Yes USE TO CHECK BLOOD SUGAR DAILY Tower, Laine LABOR, MD  Active Spouse/Significant Other, Pharmacy Records  Accu-Chek Softclix Lancets lancets [id number] Yes Check glucose level daily and as needed for diabetes type 2  (lancets for the soft clicks lancing device AccuChek meter) Tower, Laine LABOR, MD  Active Spouse/Significant Other, Pharmacy Records  acetaminophen  (TYLENOL ) 325 MG tablet [id number]  Take 650 mg by mouth every 6 (six) hours as needed.  Patient taking differently: Take 650 mg by mouth as needed.   [provider]  Active Spouse/Significant Other, Pharmacy Records  albuterol  (PROVENTIL  HFA;VENTOLIN  HFA) 108 (90 Base) MCG/ACT inhaler [id number] Yes Inhale 2 puffs into the lungs every 4 (four) hours as needed for wheezing or shortness of breath. Please give spacer if possible Tower, Laine LABOR, MD  Active Spouse/Significant Other, Pharmacy Records  amLODipine  (NORVASC ) 5 MG tablet [id number] Yes TAKE 1 TABLET (5 MG TOTAL) BY MOUTH DAILY. Tower, Laine LABOR, MD  Active Spouse/Significant Other, Pharmacy Records  apixaban  (ELIQUIS ) 5 MG TABS tablet [id number] Yes Take 1 tablet (5 mg total) by mouth 2 (two) times daily. Fenton, Clint R, PA  Active   atorvastatin  (LIPITOR) 10 MG tablet [id number]  TAKE 0.5 TABLETS (5 MG TOTAL) BY MOUTH EVERY OTHER DAY.  Patient taking differently: Take 10 mg by mouth daily. Takes occasionally - about 2-3x week per patient   Tower, Laine LABOR, MD  Active Spouse/Significant Other, Pharmacy Records           Med Note (WHITE, DONETA RAMAN   Fri Jul 29, 2024 10:06 PM) Taken occasionally  blood  glucose meter kit and supplies [id number] Yes To check glucose daily and prn for DM2 E11.9 Tower, Laine LABOR, MD  Active Spouse/Significant Other, Pharmacy Records  Blood Pressure Monitoring (ADULT BLOOD  PRESSURE CUFF LG) KIT [id number] Yes Blood pressure cuff size large for the arm to check blood pressure daily and as needed for HTN Tower, Laine LABOR, MD  Active Spouse/Significant Other, Pharmacy Records  EPINEPHrine [facility] IJ) [id number] Yes Inject as directed as needed. Reported on 12/05/2015 [provider]  Active Spouse/Significant Other, Pharmacy Records  fluticasone  (FLONASE ) 50 MCG/ACT nasal spray [id number] Yes PLACE 2 SPRAYS INTO THE NOSE DAILY. Tower, Laine LABOR, MD  Active Spouse/Significant Other, Pharmacy Records           Med Note (WHITE, DONETA RAMAN   Fri Jul 29, 2024 10:08 PM)    folic acid  (FOLVITE ) 1 MG tablet [id number] Yes Take 1 mg by mouth daily. [provider]  Active Spouse/Significant Other, Pharmacy Records  glipiZIDE  (GLUCOTROL  XL) 2.5 MG 24 hr tablet [id number] Yes TAKE 1 TABLET BY MOUTH DAILY WITH BREAKFAST. Tower, Laine LABOR, MD  Active   hydroxychloroquine  (PLAQUENIL ) 200 MG tablet [id number] Yes Take 200 mg by mouth daily. [provider]  Active Spouse/Significant Other, Pharmacy Records  loratadine  (CLARITIN ) 10 MG tablet [id number]  Take 10 mg by mouth daily as needed for allergies.  Patient taking differently: Take 10 mg by mouth as needed for allergies.   [provider]  Active Spouse/Significant Other, Pharmacy Records  methotrexate (RHEUMATREX) 2.5 MG tablet [id number] Yes Take 17.5 mg by mouth once a week. Caution:Chemotherapy. Protect from light. ( Tuesday of each week ) [provider]  Active Spouse/Significant Other, Pharmacy Records  pantoprazole  (PROTONIX ) 40 MG tablet [id number] Yes TAKE 1 TABLET BY MOUTH EVERY DAY Tower, Laine LABOR, MD  Active   polyethylene glycol (MIRALAX  / GLYCOLAX ) packet [id number] Yes Take 17 g by mouth daily. [provider]  Active Spouse/Significant Other, Pharmacy Records  triamcinolone  cream (KENALOG ) 0.1 % [id number] Yes Apply 1 Application topically 2 (two) times daily as needed. On rash/affected  area Tower, Laine LABOR, MD  Active Spouse/Significant Other, Pharmacy Records            Recommendation:   Continue Current Plan of Care  Follow Up Plan:   Telephone follow-up in 1 month  Mazzie Brodrick RN RN Care Manager [facility] [phone]

## 2024-10-17 ENCOUNTER — Ambulatory Visit: Payer: Self-pay

## 2024-10-17 NOTE — Telephone Encounter (Signed)
 Will have to see her first before recommending anything  Especially since it started last wk Agree with UC/ER precautions  Will see her then

## 2024-10-17 NOTE — Telephone Encounter (Signed)
 FYI Only or Action Required?: FYI only for provider: appointment scheduled on 12/31 by PAS.  Patient was last seen in primary care on 08/09/2024 by Randeen Laine LABOR, MD.  Called Nurse Triage reporting Rash.  Symptoms began several days ago.  Interventions attempted: OTC medications: cream and Rest, hydration, or home remedies.  Symptoms are: gradually worsening.  Triage Disposition: See Physician Within 24 Hours  Patient/caregiver understands and will follow disposition?: Yes     Message from Amber H sent at 10/17/2024 10:50 AM EST  Reason for Triage: Patient complaining of redness/burning and itching bumpy spot on right shoulder, appeared last week. Tried taking cream and its not helping. Wanted to be seen today or tomorrow however, no acute visits available until 12/31. I went ahead and booked appt however, she wants to know what could done today   [phone]   Reason for Disposition  [1] Shingles rash AND [2] onset > 72 hours ago (3 days)  Answer Assessment - Initial Assessment Questions This RN recommended pt be examined in next 24 hours for symptoms, pt already scheduled for 12/31 as earliest appt she could do with transportation issues. Advised pt call back if any new or worsening symptoms, wash hands and cut nails short, avoid itching, avoid contact with fluid if bumps were to give fluid. Pt verbalized understanding.    Covers from breast over to shoulder and up to neck, chest just little bit from over shoulder, on back just little bit Little red spots No pus or spreading redness No injury or friction No fever Burning and itching Not on face or genital area Right ear pain a little bit Ever since last week Arthritis been crying but not able to do stronger med because of my heart, see doc for arthritis  Protocols used: Shingles (Zoster)-A-AH

## 2024-10-19 ENCOUNTER — Encounter: Payer: Self-pay | Admitting: Family Medicine

## 2024-10-19 ENCOUNTER — Ambulatory Visit: Admitting: Family Medicine

## 2024-10-19 VITALS — BP 136/68 | HR 87 | Temp 98.4°F | Ht 66.0 in | Wt 145.0 lb

## 2024-10-19 DIAGNOSIS — R630 Anorexia: Secondary | ICD-10-CM | POA: Diagnosis not present

## 2024-10-19 DIAGNOSIS — M06061 Rheumatoid arthritis without rheumatoid factor, right knee: Secondary | ICD-10-CM | POA: Diagnosis not present

## 2024-10-19 DIAGNOSIS — I2510 Atherosclerotic heart disease of native coronary artery without angina pectoris: Secondary | ICD-10-CM | POA: Diagnosis not present

## 2024-10-19 DIAGNOSIS — M06059 Rheumatoid arthritis without rheumatoid factor, unspecified hip: Secondary | ICD-10-CM | POA: Diagnosis not present

## 2024-10-19 DIAGNOSIS — F4323 Adjustment disorder with mixed anxiety and depressed mood: Secondary | ICD-10-CM

## 2024-10-19 DIAGNOSIS — B029 Zoster without complications: Secondary | ICD-10-CM | POA: Insufficient documentation

## 2024-10-19 DIAGNOSIS — M1990 Unspecified osteoarthritis, unspecified site: Secondary | ICD-10-CM | POA: Diagnosis not present

## 2024-10-19 DIAGNOSIS — E1169 Type 2 diabetes mellitus with other specified complication: Secondary | ICD-10-CM | POA: Diagnosis not present

## 2024-10-19 DIAGNOSIS — Z7952 Long term (current) use of systemic steroids: Secondary | ICD-10-CM | POA: Diagnosis not present

## 2024-10-19 DIAGNOSIS — R4589 Other symptoms and signs involving emotional state: Secondary | ICD-10-CM

## 2024-10-19 DIAGNOSIS — I2584 Coronary atherosclerosis due to calcified coronary lesion: Secondary | ICD-10-CM | POA: Diagnosis not present

## 2024-10-19 DIAGNOSIS — I10 Essential (primary) hypertension: Secondary | ICD-10-CM | POA: Diagnosis not present

## 2024-10-19 DIAGNOSIS — D649 Anemia, unspecified: Secondary | ICD-10-CM | POA: Diagnosis not present

## 2024-10-19 DIAGNOSIS — I4892 Unspecified atrial flutter: Secondary | ICD-10-CM | POA: Diagnosis not present

## 2024-10-19 DIAGNOSIS — I441 Atrioventricular block, second degree: Secondary | ICD-10-CM | POA: Diagnosis not present

## 2024-10-19 DIAGNOSIS — E785 Hyperlipidemia, unspecified: Secondary | ICD-10-CM | POA: Diagnosis not present

## 2024-10-19 MED ORDER — GABAPENTIN 100 MG PO CAPS: 100.0000 mg | ORAL_CAPSULE | Freq: Two times a day (BID) | ORAL | 1 refills | Status: DC

## 2024-10-19 NOTE — Assessment & Plan Note (Signed)
 See a/p adj rxn Declines treatment

## 2024-10-19 NOTE — Progress Notes (Signed)
 "  Subjective:    Patient ID: [name], female    DOB: 1941-05-27, 83 y.o.   MRN: [id number]  HPI  Wt Readings from Last 3 Encounters:  10/19/24 145 lb (65.8 kg)  09/28/24 143 lb (64.9 kg)  09/02/24 148 lb 6.4 oz (67.3 kg)   23.40 kg/m  Vitals:   10/19/24 1203  BP: 136/68  Pulse: 87  Temp: 98.4 F (36.9 C)  SpO2: 99%   Pt presents for  Rash on right shoulder  Weight loss    Rash -right shoulder and neck  Started last week  Painful and sensitive Not itchy   Had shingrix 10/2022    Weight loss   Not hungry  Lost appetite for about a year  Correlates with depressed mood Will not make herself eat if not hungry  Interested in stopping glipizide  /was on it for increase glucose from prednisone   Mood is not optimal     10/19/2024   12:16 PM 09/28/2024   10:26 AM 08/31/2024   10:53 AM 08/11/2024    9:53 AM 08/09/2024   12:02 PM  Depression screen PHQ 2/9  Decreased Interest 0 0 0 0 3  Down, Depressed, Hopeless 2 0 0 0 0  PHQ - 2 Score 2 0 0 0 3  Altered sleeping 2 2   2   Tired, decreased energy 3 3   3   Change in appetite 3 3   3   Feeling bad or failure about yourself  3 2   2   Trouble concentrating 3 1   1   Moving slowly or fidgety/restless 3 0   0  Suicidal thoughts 0 0   0  PHQ-9 Score 19 11   14    Difficult doing work/chores Very difficult Very difficult   Somewhat difficult     Data saved with a previous flowsheet row definition   Declines treatment for mood entirely     Lab Results  Component Value Date   WBC 4.5 08/09/2024   HGB 12.0 08/09/2024   HCT 36.5 08/09/2024   MCV 85.9 08/09/2024   PLT 291.0 08/09/2024   Lab Results  Component Value Date   NA 137 08/09/2024   K 4.1 08/09/2024   CO2 26 08/09/2024   GLUCOSE 91 08/09/2024   BUN 10 08/09/2024   CREATININE 0.64 08/09/2024   CALCIUM  9.2 08/09/2024   GFR 82.39 08/09/2024   GFRNONAA >60 08/02/2024   Lab Results  Component Value Date   ALT 11 07/30/2024   AST 29  07/30/2024   ALKPHOS 51 07/30/2024   BILITOT 1.4 (H) 07/30/2024   Lab Results  Component Value Date   TSH 1.572 07/29/2024   Mag normal at 2.1 in oct   Last vitamin D No results found for: MARIEN BOLLS, VD25OH   Lab Results  Component Value Date   HGBA1C 5.4 08/09/2024   HGBA1C 6.1 12/02/2023   HGBA1C 6.8 (H) 05/12/2023     Patient Active Problem List   Diagnosis Date Noted   Shingles 10/19/2024   Moderate mitral regurgitation 08/09/2024   Presence of heart assist device (HCC) 08/09/2024   Bradycardia 07/29/2024   Knee swelling 06/03/2024   Buttock pain 06/03/2024   Injury of right hip 02/02/2024   Coccyx pain 02/02/2024   Fall at home, initial encounter 02/02/2024   Estrogen deficiency 12/02/2023   Depressed mood 09/05/2023   Hypokalemia 09/04/2023   Decreased appetite 08/26/2023   History of UTI 08/26/2023   Heart murmur 08/27/2022  Mobility impaired 07/22/2022   Current use of proton pump inhibitor 10/01/2021   General weakness 10/01/2021   Poor balance 10/01/2021   On prednisone  therapy 10/01/2021   Diabetes mellitus treated with oral medication (HCC) 11/13/2020   Hyperlipidemia associated with type 2 diabetes mellitus (HCC) 10/15/2020   Fatigue 01/30/2020   Encounter for screening mammogram for breast cancer 10/27/2017   Routine general medical examination at a health care facility 10/27/2017   Aortic atherosclerosis 02/03/2017   RA (rheumatoid arthritis) (HCC)    Adjustment disorder with mixed anxiety and depressed mood 06/24/2016   Essential hypertension 01/07/2016   Coronary atherosclerosis 11/07/2015   Osteoarthritis 05/10/2014   GERD 07/09/2007   Past Medical History:  Diagnosis Date   Allergic rhinitis    Cataract 2015   bilateral   COVID-19 10/2019   ASYMPTOMATIC   Diabetes mellitus without complication (HCC)    Diverticulosis    Esophageal reflux    Essential hypertension 01/07/2016   Fatty liver 2018   Fibula fracture  02/2005   Former smoker    Gastritis 2003   Heart murmur    MILD, NO CARDIOLOGIST   Hemorrhoids    History of cardiac dysrhythmia 2011   ABLATION  AT [facility]   History of kidney stones YRS AGO   Hyperglycemia    Hyperlipemia    Osteoarthritis    Personal history of colonic polyps 1998   hyperplastic   PMB (postmenopausal bleeding)    Pre-diabetes    BORDERLINE DIET CONTROLLED DOES NOT CHECK CBG   RA (rheumatoid arthritis) (HCC)    Rectal bleed 2018   TRANSFUSION GIVEN   Shingles 2012   Uterine fibroid    Past Surgical History:  Procedure Laterality Date   CHOLECYSTECTOMY  YRS AGO   COLONOSCOPY  11/08   internal hemorrhoids   DILATATION & CURETTAGE/HYSTEROSCOPY WITH MYOSURE N/A 07/06/2020   Procedure: DILATATION & CURETTAGE/HYSTEROSCOPY;  Surgeon: Arnaldo Purchase, MD;  Location: [facility];  Service: Gynecology;  Laterality: N/A;  request 7:30am OR time in [place] Gyn block requests 30 minutes   EP procedure  1994   SVT; normal echo   ESOPHAGOGASTRODUODENOSCOPY  11/03   reactive gastropathy   EYE SURGERY Bilateral 2017   CATARACTS   LIPOMA EXCISION  11/2002   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 ablation  2011   AT [facility]   TUBAL LIGATION  YRS AGO   vaginal polyp excised  12/2000   benign   Social History[1] Family History  Problem Relation Age of Onset   Colon cancer Father 69   Diabetes Mother    Heart disease Mother    Kidney disease Mother        ESRD   Stroke Mother    Hypertension Brother    Breast cancer Sister 30   Diabetes Other        nephews   Colon cancer Sister    Esophageal cancer Neg Hx    Stomach cancer Neg Hx    Rectal cancer Neg Hx    Allergies[2] Medications Ordered Prior to Encounter[3]  Review of Systems  Constitutional:  Positive for appetite change and  fatigue. Negative for fever.  Gastrointestinal:  Negative for nausea and vomiting.  Musculoskeletal:  Positive for arthralgias, back pain, gait problem, joint swelling, myalgias and neck pain.  Skin:  Positive for rash.  Objective:   Physical Exam Constitutional:      General: She is not in acute distress.    Appearance: Normal appearance. She is well-developed and normal weight. She is not ill-appearing or diaphoretic.  HENT:     Head: Normocephalic and atraumatic.  Eyes:     Conjunctiva/sclera: Conjunctivae normal.     Pupils: Pupils are equal, round, and reactive to light.  Neck:     Thyroid : No thyromegaly.     Vascular: No carotid bruit or JVD.  Cardiovascular:     Rate and Rhythm: Normal rate and regular rhythm.     Heart sounds: Normal heart sounds.     No gallop.  Pulmonary:     Effort: Pulmonary effort is normal. No respiratory distress.     Breath sounds: Normal breath sounds. No wheezing or rales.  Abdominal:     General: There is no distension or abdominal bruit.     Palpations: Abdomen is soft.  Musculoskeletal:     Cervical back: Normal range of motion and neck supple.     Right lower leg: No edema.     Left lower leg: No edema.     Comments: Diffusely tender -joints and myofasical   Lymphadenopathy:     Cervical: No cervical adenopathy.  Skin:    General: Skin is warm and dry.     Coloration: Skin is not pale.     Findings: No rash.     Comments: Dried/scabbed vesicular patchy rash over right shoulder and neck  Tender to touch No open areas     Neurological:     Mental Status: She is alert.     Cranial Nerves: No cranial nerve deficit.     Coordination: Coordination normal.     Deep Tendon Reflexes: Reflexes are normal and symmetric. Reflexes normal.  Psychiatric:        Mood and Affect: Mood normal.     Comments: Irritable today  Argumentative at times re: discussion of depressed mood and pain            Assessment & Plan:   Problem  List Items Addressed This Visit       Nervous and Auditory   Shingles - Primary   Right shoulder/ neck -dried (almost resolved) vesicular rash Pain persists  Pain refers to jaw and base of skull  Started last week -out of the window for anti viral treatment  Unfortunately per husband, pt declined visit until now  High risk for post herpetic neuralgia  Pt already has mod to severe chronic pain from RA as well   Discussed treatment options  Will try gabapentin  100 mg bid  (if well tolerated-we can consider titration up)  Reviewed potential side effects including dizziness/sedation Instructed to hold med and call if not tolerated  May help anxiety and appetite (hopeful)   Follow up 2 wk for re check  Handout given on shingles  Of note pt had first shingrix vaccine / but not 2nd  She is also immunocomp due to RA medications          Other   On prednisone  therapy   Glucose has remained low even on prednisone   Do not think glipizide  is needed D/c that today      Depressed mood   See a/p adj rxn Declines treatment       Decreased appetite   Suspect due to depression /mood disorder Causing less po intake And weight loss  Pt declines treatment  Adjustment disorder with mixed anxiety and depressed mood   Depressed mood (marked) No SI  I suspect this is primary reason for reduced appetite causing decreased po intake and weight loss  Per pt due to her chronic pain that is not controlled / RA She is irritable today  Continues to decline treatment of any kind-urged her to re consider as treatment of depression may also help her pain She and husband voiced understanding  Planning follow up 2 wk          [1]  Social History Tobacco Use   Smoking status: Former    Current packs/day: 0.00    Average packs/day: 0.8 packs/day for 15.0 years (11.3 ttl pk-yrs)    Types: Cigarettes    Start date: 10/20/1988    Quit date: 10/21/2003    Years since quitting: 21.0    Smokeless tobacco: Never  Vaping Use   Vaping status: Never Used  Substance Use Topics   Alcohol use: No    Alcohol/week: 0.0 standard drinks of alcohol   Drug use: No  [2]  Allergies Allergen Reactions   Bee Venom Anaphylaxis   Dicyclomine      Blurry vision   Humira [Adalimumab]     LIPS SWELLED AND RASH   Metformin  And Related     rash  [3]  Current Outpatient Medications on File Prior to Visit  Medication Sig Dispense Refill   ACCU-CHEK GUIDE TEST test strip USE TO CHECK BLOOD SUGAR DAILY 100 strip 1   Accu-Chek Softclix Lancets lancets Check glucose level daily and as needed for diabetes type 2  (lancets for the soft clicks lancing device AccuChek meter) 100 each 3   acetaminophen  (TYLENOL ) 325 MG tablet Take 650 mg by mouth every 6 (six) hours as needed. (Patient taking differently: Take 650 mg by mouth as needed.)     albuterol  (PROVENTIL  HFA;VENTOLIN  HFA) 108 (90 Base) MCG/ACT inhaler Inhale 2 puffs into the lungs every 4 (four) hours as needed for wheezing or shortness of breath. Please give spacer if possible 1 Inhaler 3   amLODipine  (NORVASC ) 5 MG tablet TAKE 1 TABLET (5 MG TOTAL) BY MOUTH DAILY. 90 tablet 1   apixaban  (ELIQUIS ) 5 MG TABS tablet Take 1 tablet (5 mg total) by mouth 2 (two) times daily. 60 tablet 3   atorvastatin  (LIPITOR) 10 MG tablet TAKE 0.5 TABLETS (5 MG TOTAL) BY MOUTH EVERY OTHER DAY. (Patient taking differently: Take 10 mg by mouth daily. Takes occasionally - about 2-3x week per patient) 22 tablet 3   blood glucose meter kit and supplies To check glucose daily and prn for DM2 E11.9 1 each 0   Blood Pressure Monitoring (ADULT BLOOD PRESSURE CUFF LG) KIT Blood pressure cuff size large for the arm to check blood pressure daily and as needed for HTN 1 kit 0   EPINEPHrine (EPIPEN IJ) Inject as directed as needed. Reported on 12/05/2015     fluticasone  (FLONASE ) 50 MCG/ACT nasal spray PLACE 2 SPRAYS INTO THE NOSE DAILY. 48 g 3   folic acid  (FOLVITE ) 1 MG tablet  Take 1 mg by mouth daily.     hydroxychloroquine  (PLAQUENIL ) 200 MG tablet Take 200 mg by mouth daily.     loratadine  (CLARITIN ) 10 MG tablet Take 10 mg by mouth daily as needed for allergies. (Patient taking differently: Take 10 mg by mouth as needed for allergies.)     methotrexate (RHEUMATREX) 2.5 MG tablet Take 17.5 mg by mouth once a week. Caution:Chemotherapy. Protect from  light. ( Tuesday of each week )     pantoprazole  (PROTONIX ) 40 MG tablet TAKE 1 TABLET BY MOUTH EVERY DAY 90 tablet 0   polyethylene glycol (MIRALAX  / GLYCOLAX ) packet Take 17 g by mouth daily.     triamcinolone  cream (KENALOG ) 0.1 % Apply 1 Application topically 2 (two) times daily as needed. On rash/affected area 15 g 0   No current facility-administered medications on file prior to visit.   "

## 2024-10-19 NOTE — Assessment & Plan Note (Signed)
 Depressed mood (marked) No SI  I suspect this is primary reason for reduced appetite causing decreased po intake and weight loss  Per pt due to her chronic pain that is not controlled / RA She is irritable today  Continues to decline treatment of any kind-urged her to re consider as treatment of depression may also help her pain She and husband voiced understanding  Planning follow up 2 wk

## 2024-10-19 NOTE — Assessment & Plan Note (Signed)
 Glucose has remained low even on prednisone   Do not think glipizide  is needed D/c that today

## 2024-10-19 NOTE — Patient Instructions (Addendum)
 Stop your glipizide  -I don't think you need it    I think you have shingles  The rash is getting better , but it will take longer for the pain to improve after the rash goes away   I want to try a medicine called gabapentin  (for nerve pain)  We start low with it because it can cause sleepiness and light headedness -use caution   Take 100 mg twice daily (am and pm)  If you have any side effects -stop it and let us  know   It may help your anxiety  It also help your appetite    I do think that you suffer from depression  Medication can help in the future but I know you do not want that right now  Let us  know if you change your mind about that   Follow up in about 2 weeks  Cancel the appointment at end of January

## 2024-10-19 NOTE — Assessment & Plan Note (Addendum)
 Right shoulder/ neck -dried (almost resolved) vesicular rash Pain persists  Pain refers to jaw and base of skull  Started last week -out of the window for anti viral treatment  Unfortunately per husband, pt declined visit until now  High risk for post herpetic neuralgia  Pt already has mod to severe chronic pain from RA as well   Discussed treatment options  Will try gabapentin  100 mg bid  (if well tolerated-we can consider titration up)  Reviewed potential side effects including dizziness/sedation Instructed to hold med and call if not tolerated  May help anxiety and appetite (hopeful)   Follow up 2 wk for re check  Handout given on shingles  Of note pt had first shingrix vaccine / but not 2nd  She is also immunocomp due to RA medications

## 2024-10-19 NOTE — Assessment & Plan Note (Signed)
 Suspect due to depression /mood disorder Causing less po intake And weight loss  Pt declines treatment

## 2024-10-28 ENCOUNTER — Other Ambulatory Visit (HOSPITAL_COMMUNITY): Payer: Self-pay

## 2024-10-28 MED ORDER — APIXABAN 5 MG PO TABS: 5.0000 mg | ORAL_TABLET | Freq: Two times a day (BID) | ORAL | Status: AC

## 2024-10-28 MED ORDER — APIXABAN 5 MG PO TABS: 5.0000 mg | ORAL_TABLET | Freq: Two times a day (BID) | ORAL | Status: DC

## 2024-10-31 ENCOUNTER — Telehealth: Payer: Self-pay

## 2024-10-31 NOTE — Telephone Encounter (Signed)
 Pt called in stating that she has shingles and it is not completely dry and crusted so she needs to reschedule appt for 11/02/2024.

## 2024-11-01 ENCOUNTER — Telehealth: Payer: Self-pay | Admitting: Cardiology

## 2024-11-01 ENCOUNTER — Ambulatory Visit: Payer: Self-pay

## 2024-11-01 NOTE — Telephone Encounter (Signed)
 This RN contacted pt to make her aware that cardiology office was attempting to contact her but her VM was full. Provided patient with cardiology office phone number for her to call them back, pt wrote down number and agreed to call.

## 2024-11-01 NOTE — Telephone Encounter (Signed)
Called pt mail box is full unable to leave msg

## 2024-11-01 NOTE — Telephone Encounter (Signed)
 Denise from [facility] reached out on behalf of patient to inform her Dr. of fall due to possible pacemaker that was received last month, pt would like a c/b to discuss possible outcomes, please advise
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
## 2024-11-01 NOTE — Telephone Encounter (Signed)
 Forwarding to device clinic for transmission report to see if it shows anything that could be associated with fall, before I call back to PCP office

## 2024-11-01 NOTE — Telephone Encounter (Signed)
 Updated PCP office that we are attempting to  reach pt for a transmission to see if any issues/correlation to fall

## 2024-11-01 NOTE — Telephone Encounter (Signed)
 Aware, will watch for correspondence Unfortunate she declined disposition   Please check in with her tomorrow to see how she is

## 2024-11-02 ENCOUNTER — Ambulatory Visit: Admitting: Family Medicine

## 2024-11-02 ENCOUNTER — Other Ambulatory Visit

## 2024-11-02 ENCOUNTER — Ambulatory Visit: Admitting: Cardiology

## 2024-11-02 ENCOUNTER — Telehealth: Payer: Self-pay | Admitting: *Deleted

## 2024-11-02 NOTE — Telephone Encounter (Signed)
 Copied from CRM [id number]. Topic: General - Other >> Nov 01, 2024  4:49 PM China J wrote: Reason for CRM: Sherri from [facility], returning a call from [place] (insurance claims handler).   She wanted to let the clinic know that they have received the update about the patient in regards to her recent falls as well as the pacemaker situation. As of now, she is trying to reach the patient in order to get the pacemaker transmission to see what has been going on before they can follow up with the clinic.   For any questions, please call: [phone]

## 2024-11-02 NOTE — Telephone Encounter (Signed)
Aware. Thanks for the update.

## 2024-11-02 NOTE — Patient Instructions (Signed)
 Visit Information  Thank you for taking time to visit with me today. Please don't hesitate to contact me if I can be of assistance to you before our next scheduled appointment.  Your next care management appointment is by telephone on 11/29/2024 at 10:30 am  Telephone follow-up in 1 month  Please call the care guide team at [phone] if you need to cancel, schedule, or reschedule an appointment.   Please call the Suicide and Crisis Lifeline: 988 call the [place]  National Suicide Prevention Lifeline: (779) 184-5399 or TTY: 856-441-6620 TTY 787-721-7890) to talk to a trained counselor call 1-800-273-TALK (toll free, 24 hour hotline) go to [facility] [address] [place] [phone]) call 911 if you are experiencing a Mental Health or Behavioral Health Crisis or need someone to talk to.  Nestora Duos, MSN, RN [place]  [facility], [facility] RN Care Manager Direct Dial: [phone] Fax: [phone]   Nerve Pain After Shingles Postherpetic neuralgia (PHN) is nerve pain you may get after you have shingles. Shingles is an infection that causes a painful rash and blisters. It's caused by the same germ that causes chickenpox. PHN affects the spot on your body where you had the shingles rash. It can last for 3 months after your rash has gone away. What are the causes? PHN may be caused by damage to your nerves. This damage may come from swelling from the shingles infection. What increases the risk? You may be more likely to get PHN if: You're older than 83 years of age. You have severe pain before your rash starts. You have a very bad rash. You have shingles in and around your eye. Your body defense system (immune system) is weak. What are the signs or symptoms? The main symptom of PHN is pain. The pain may: Be stabbing, burning, or shooting. Feel like an electric shock. Come and go, or it may be there all the  time. Get worse if: Something touches your skin. The temperature goes up or down. You may also have itching. How is this diagnosed? PHN may be diagnosed based on: Your symptoms. Whether you've had shingles before. How is this treated? There's no cure, but treatment can help with the pain. Normal pain medicines may not help. You may need to work with an expert in treating pain to find what works best for you. Treatment may include: Anti-seizure medicines. Antidepressants. Strong pain medicines. A numbing patch worn on the skin. Shots of: Numbing medicines. Medicines to treat inflammation. Botulinum toxin. This can block pain signals and stop you from feeling pain. Follow these instructions at home: Medicines Take over-the-counter and prescription medicines only as told by your health care provider. Ask your provider if the medicine prescribed to you: Requires you to avoid driving or using machinery. Can cause trouble pooping or constipation. You may need to take these actions to prevent or treat trouble pooping: Drink enough fluid to keep your pee (urine) pale yellow. Take over-the-counter or prescription medicines. Eat foods that are high in fiber, such as beans, whole grains, and fresh fruits and vegetables. Limit foods that are high in fat and processed sugars, such as fried or sweet foods. Managing pain  If told, put ice on the painful area. Put ice in a plastic bag. Place a towel between your skin and the bag. Leave the ice on for 20 minutes, 2-3 times a day. If your skin turns bright red, remove the ice right away to prevent skin damage.  The risk of damage is higher if you can't feel pain, heat, or cold. Cover sensitive spots with a bandage, or dressing, to stop clothes from rubbing. Wear loose clothes. General instructions It may take a long time for you to get better. Work closely with your provider. Think about talking with a mental health care provider. They can help  you find ways to cope with feeling overwhelmed or hopeless. Have a good support system at home. Think about joining a pain support group. How is this prevented? Vaccines are the best way to prevent shingles and PHN. You should get the vaccine shot for shingles once you're older than 83 years of age. Talk with your provider about getting the shot. Contact a health care provider if: Your medicine isn't helping. You can't manage your pain at home. You feel sad or depressed. Get help right away if: You have thoughts about hurting yourself or others. Get help right away if you feel like you may hurt yourself or others, or have thoughts about taking your own life. Go to your nearest emergency room or: Call 911. Call the National Suicide Prevention Lifeline at 954-835-5900 or 988. This is open 24 hours a day. Text the Crisis Text Line at (640)032-8519. This information is not intended to replace advice given to you by your health care provider. Make sure you discuss any questions you have with your health care provider. Document Revised: 01/08/2023 Document Reviewed: 01/08/2023 Elsevier Patient Education  2024 Elsevier Inc.   Shingles  Shingles, or herpes zoster, is an infection. It gives you a skin rash and blisters. These infected areas may hurt a lot. Shingles only happens if: You've had chickenpox. You've been given a shot called a vaccine to protect you from getting chickenpox. Shingles is rare in this case. What are the causes? Shingles is caused by a germ called the varicella-zoster virus. This is the same germ that causes chickenpox. After you're exposed to the germ, it stays in your body but is dormant. This means it isn't active. Shingles happens if the germ becomes active again. This can happen years after you're first exposed to the germ. What increases the risk? You may be more likely to get shingles if: You're older than 83 years of age. You're under a lot of stress. You have a  weak immune system. The immune system is your body's defense system. It may be weak if: You have human immunodeficiency virus (HIV). You have acquired immunodeficiency syndrome (AIDS). You have cancer. You take medicines that weaken your immune system. These include organ transplant medicines. What are the signs or symptoms? The first symptoms of shingles may be itching, tingling, or pain. Your skin may feel like it's burning. A few days or Dioselina Brumbaugh later, you'll get a rash. Here's what you can expect: The rash is likely to be on one side of your body. The rash may be shaped like a belt or a band. Over time, it will turn into blisters filled with fluid. The blisters will break open and change into scabs. The scabs will dry up in about 2-3 Iona Stay. You may also have: A fever. Chills. A headache. Nausea. How is this diagnosed? Shingles is diagnosed with a skin exam. A sample called a culture may be taken from one of your blisters and sent to a lab. This will show if you have shingles. How is this treated? The rash may last for several Johanna Matto. There's no cure for shingles, but your health care provider may  give you medicines. These medicines may: Help with pain. Help with itching. Help with irritation and swelling. Help you get better sooner. Help to prevent long-term problems. If the rash is on your face, you may need to see an eye doctor or an ear, nose, and throat (ENT) doctor. Follow these instructions at home: Medicines Take your medicines only as told by your provider. Put an anti-itch cream or numbing cream on the rash or blisters as told by your provider. Relieving itching and discomfort  To help with itching: Put cold, wet cloths called cold compresses on the rash or blisters. Take a cool bath. Try adding baking soda or dry oatmeal to the water. Do not bathe in hot water. Use calamine lotion on the rash or blisters. You can get this type of lotion at the store. Blister and rash  care Keep your rash covered with a loose bandage. Wear loose clothes that don't rub on your rash. Take care of your rash as told by your provider. Make sure you: Wash your hands with soap and water for at least 20 seconds before and after you change your bandage. If you can't use soap and water, use hand sanitizer. Keep your rash and blisters clean by washing them with mild soap and cool water. Change your bandage. Check your rash every day for signs of infection. Check for: More redness, swelling, or pain. Fluid or blood. Warmth. Pus or a bad smell. Do not scratch your rash. Do not pick at your blisters. To help you not scratch: Keep your fingernails clean and cut short. Try to wear gloves or mittens when you sleep. General instructions Rest. Wash your hands often with soap and water for at least 20 seconds. If you can't use soap and water, use hand sanitizer. Washing your hands lowers your chance of getting a skin infection. Your infection can cause chickenpox in others. If you have blisters that aren't scabs yet, stay away from: Babies. Pregnant people. Children who have eczema. Older people who have organ transplants. People who have a long-term, or chronic, illness. Anyone who hasn't had chickenpox before. Anyone who hasn't gotten the chickenpox vaccine. How is this prevented? Vaccines are the best way to prevent you from getting chickenpox or shingles. Talk with your provider about getting these shots. Where to find more information Centers for Disease Control and Prevention (CDC): tonerpromos.no Contact a health care provider if: Your pain doesn't get better with medicine. Your pain doesn't get better after the rash heals. You have any signs of infection around the rash. Your rash or blisters get worse. You have a fever or chills. Get help right away if: The rash is on your face or nose. You have pain in your face or by your eye. You lose feeling on one side of your face. You  have trouble seeing. You have ear pain or ringing in your ear. This information is not intended to replace advice given to you by your health care provider. Make sure you discuss any questions you have with your health care provider. Document Revised: 07/09/2023 Document Reviewed: 11/21/2022 Elsevier Patient Education  2024 [employer].

## 2024-11-02 NOTE — Telephone Encounter (Addendum)
 Called patient has phone call with cardiology at 11 today. She states she is feeling a little better just sore. She is able to get up and walk around a little. She still having shoulder pain. She states her shingles have dried up a little more.

## 2024-11-02 NOTE — Telephone Encounter (Signed)
 Scheduled for 1/27 with Camnitz.

## 2024-11-02 NOTE — Patient Outreach (Signed)
 Complex Care Management   Visit Note  11/02/2024  Name:  [name] MRN: [id number] DOB: 09/07/1941  Situation: Referral received for Complex Care Management related to RA Pain, Falls I obtained verbal consent from Patient.  Visit completed with Patient  on the phone  Background:   Past Medical History:  Diagnosis Date   Allergic rhinitis    Cataract 2015   bilateral   COVID-19 10/2019   ASYMPTOMATIC   Diabetes mellitus without complication (HCC)    Diverticulosis    Esophageal reflux    Essential hypertension 01/07/2016   Fatty liver 2018   Fibula fracture 02/2005   Former smoker    Gastritis 2003   Heart murmur    MILD, NO CARDIOLOGIST   Hemorrhoids    History of cardiac dysrhythmia 2011   ABLATION  AT [facility]   History of kidney stones YRS AGO   Hyperglycemia    Hyperlipemia    Osteoarthritis    Personal history of colonic polyps 1998   hyperplastic   PMB (postmenopausal bleeding)    Pre-diabetes    BORDERLINE DIET CONTROLLED DOES NOT CHECK CBG   RA (rheumatoid arthritis) (HCC)    Rectal bleed 2018   TRANSFUSION GIVEN   Shingles 2012   Uterine fibroid     Assessment: Patient Reported Symptoms:  Cognitive Cognitive Status: No symptoms reported Cognitive/Intellectual Conditions Management [RPT]: None reported or documented in medical history or problem list      Neurological Neurological Review of Symptoms: Numbness, Other: Neurological Comment: currently has shingles  gabapentin  for pain and neuropathy  HEENT HEENT Symptoms Reported: No symptoms reported      Cardiovascular   Cardiovascular Comment: no dizziness or palpitations, later reported sometimes issues with light-headedness if does not eat - poor appetite, discussed eating small amounts throughout day - agreed to try  Respiratory Respiratory Symptoms Reported: No symptoms reported Other Respiratory Symptoms: encouraged to take deep breaths every hour since limited movement diue to pain     Endocrine Endocrine Symptoms Reported: No symptoms reported List most recent blood sugar readings, include date and time of day: A1C 5.4 no longer checking BG    Gastrointestinal Gastrointestinal Symptoms Reported: Constipation Additional Gastrointestinal Details: manages with Miralax , appetite fair - discussed eating small snacks/portions throughout day Gastrointestinal Management Strategies: Adequate rest, Medication therapy    Genitourinary Genitourinary Symptoms Reported: No symptoms reported Genitourinary Management Strategies: Incontinence garment/pad  Integumentary Additional Integumentary Details: currently with shingles for about 2 [name], missed window for antiviral, no open areas, drying, on R upper back shoulder and by breast, occasional itch, applies pressure or rubs gently, not scratching, reports tylenol  and gabapentin  helping    Musculoskeletal Musculoskelatal Symptoms Reviewed: Back pain, Joint pain, Muscle pain Additional Musculoskeletal Details: shingles pain 8/10 at times, back pain with moving due to fall and hitting lower back 8/10, RA joint pain 8/10 at times tylenol  and gabapentin , recent fall Musculoskeletal Management Strategies: Routine screening, Medical device, Medication therapy Falls in the past year?: Yes Number of falls in past year: 1 or less Was there an injury with Fall?: No Fall Risk Category Calculator: 1 Patient Fall Risk Level: Low Fall Risk Patient at Risk for Falls Due to: History of fall(s), Impaired mobility Fall risk Follow up: Falls evaluation completed, Falls prevention discussed  Psychosocial Psychosocial Symptoms Reported: No symptoms reported Additional Psychological Details: patient reports mood pretty good, PCP noted concerns about depression, patient would like to be able to do more for herself, notinterested in LCSW or  Pallitive Care Behavioral Management Strategies: Support system Major Change/Loss/Stressor/Fears (CP): Medical  condition, self      11/02/2024    PHQ2-9 Depression Screening   Little interest or pleasure in doing things More than half the days  Feeling down, depressed, or hopeless More than half the days (reports depressed when hurting)  PHQ-2 - Total Score 4  Trouble falling or staying asleep, or sleeping too much More than half the days (due to pain)  Feeling tired or having little energy Nearly every day  Poor appetite or overeating  Nearly every day  Feeling bad about yourself - or that you are a failure or have let yourself or your family down Not at all  Trouble concentrating on things, such as reading the newspaper or watching television Not at all  Moving or speaking so slowly that other people could have noticed.  Or the opposite - being so fidgety or restless that you have been moving around a lot more than usual Several days  Thoughts that you would be better off dead, or hurting yourself in some way Not at all  PHQ2-9 Total Score 13  If you checked off any problems, how difficult have these problems made it for you to do your work, take care of things at home, or get along with other people Very difficult  Depression Interventions/Treatment Patient refuses Treatment    There were no vitals filed for this visit.    Medications Reviewed Today     Reviewed by Devra Lands, RN (Registered Nurse) on 11/02/24 at 1111  Med List Status: <None>   Medication Order Taking? Sig Documenting Provider Last Dose Status Informant  ACCU-CHEK GUIDE TEST test strip [id number]  USE TO CHECK BLOOD SUGAR DAILY  Patient not taking: Reported on 11/02/2024   Randeen Laine LABOR, MD  Active Spouse/Significant Other, Pharmacy Records  Accu-Chek Softclix Lancets lancets [id number]  Check glucose level daily and as needed for diabetes type 2  (lancets for the soft clicks lancing device AccuChek meter)  Patient not taking: Reported on 11/02/2024   Randeen Laine LABOR, MD  Active Spouse/Significant Other, Pharmacy Records   acetaminophen  (TYLENOL ) 325 MG tablet [id number] Yes Take 650 mg by mouth every 6 (six) hours as needed. [provider]  Active Spouse/Significant Other, Pharmacy Records  albuterol  (PROVENTIL  HFA;VENTOLIN  HFA) 108 (90 Base) MCG/ACT inhaler [id number] Yes Inhale 2 puffs into the lungs every 4 (four) hours as needed for wheezing or shortness of breath. Please give spacer if possible Tower, Laine LABOR, MD  Active Spouse/Significant Other, Pharmacy Records  amLODipine  (NORVASC ) 5 MG tablet [id number] Yes TAKE 1 TABLET (5 MG TOTAL) BY MOUTH DAILY. Tower, Laine LABOR, MD  Active Spouse/Significant Other, Pharmacy Records  apixaban  (ELIQUIS ) 5 MG TABS tablet [id number] Yes Take 1 tablet (5 mg total) by mouth 2 (two) times daily. Fenton, Clint R, PA  Active   atorvastatin  (LIPITOR) 10 MG tablet [id number] Yes TAKE 0.5 TABLETS (5 MG TOTAL) BY MOUTH EVERY OTHER DAY.  Patient taking differently: Take 10 mg by mouth daily. Takes occasionally - about 2-3x week per patient   Tower, Laine LABOR, MD  Active Spouse/Significant Other, Pharmacy Records           Med Note (WHITE, DONETA RAMAN   Fri Jul 29, 2024 10:06 PM) Taken occasionally  blood glucose meter kit and supplies [id number]  To check glucose daily and prn for DM2 E11.9  Patient not taking: Reported on 11/02/2024   Randeen Laine LABOR, MD  Active  Spouse/Significant Other, Pharmacy Records  Blood Pressure Monitoring (ADULT BLOOD PRESSURE CUFF LG) KIT [id number]  Blood pressure cuff size large for the arm to check blood pressure daily and as needed for HTN Tower, Laine LABOR, MD  Active Spouse/Significant Other, Pharmacy Records  EPINEPHrine [facility] IJ) [id number] Yes Inject as directed as needed. Reported on 12/05/2015 [provider]  Active Spouse/Significant Other, Pharmacy Records  fluticasone  (FLONASE ) 50 MCG/ACT nasal spray [id number] Yes PLACE 2 SPRAYS INTO THE NOSE DAILY. Tower, Laine LABOR, MD  Active Spouse/Significant Other, Pharmacy Records           Med Note  (WHITE, DONETA RAMAN   Fri Jul 29, 2024 10:08 PM)    folic acid  (FOLVITE ) 1 MG tablet [id number] Yes Take 1 mg by mouth daily. [provider]  Active Spouse/Significant Other, Pharmacy Records  gabapentin  (NEURONTIN ) 100 MG capsule [id number] Yes Take 1 capsule (100 mg total) by mouth 2 (two) times daily. Tower, Laine LABOR, MD  Active   hydroxychloroquine  (PLAQUENIL ) 200 MG tablet [id number] Yes Take 200 mg by mouth daily. [provider]  Active Spouse/Significant Other, Pharmacy Records  loratadine  (CLARITIN ) 10 MG tablet [id number] Yes Take 10 mg by mouth daily as needed for allergies.  Patient taking differently: Take 10 mg by mouth as needed for allergies.   [provider]  Active Spouse/Significant Other, Pharmacy Records  methotrexate (RHEUMATREX) 2.5 MG tablet [id number] Yes Take 17.5 mg by mouth once a week. Caution:Chemotherapy. Protect from light. ( Tuesday of each week ) [provider]  Active Spouse/Significant Other, Pharmacy Records  pantoprazole  (PROTONIX ) 40 MG tablet [id number] Yes TAKE 1 TABLET BY MOUTH EVERY DAY Tower, Marne A, MD  Active   polyethylene glycol (MIRALAX  / GLYCOLAX ) packet [id number] Yes Take 17 g by mouth daily. [provider]  Active Spouse/Significant Other, Pharmacy Records  triamcinolone  cream (KENALOG ) 0.1 % [id number] Yes Apply 1 Application topically 2 (two) times daily as needed. On rash/affected area Tower, Laine LABOR, MD  Active Spouse/Significant Other, Pharmacy Records            Recommendation:   PCP Follow-up Specialty provider follow-up Call ardiology to reschedule [phone] Continue Current Plan of Care  Follow Up Plan:   Telephone follow-up in 1 month  Nestora Duos, MSN, RN [facility]  [facility], [facility] RN Care Manager Direct Dial: [phone] Fax: [phone]

## 2024-11-02 NOTE — Telephone Encounter (Signed)
 Thanks for letting me know!

## 2024-11-03 ENCOUNTER — Telehealth: Payer: Self-pay | Admitting: *Deleted

## 2024-11-03 DIAGNOSIS — M069 Rheumatoid arthritis, unspecified: Secondary | ICD-10-CM

## 2024-11-03 NOTE — Telephone Encounter (Signed)
 Copied from CRM #[id number]. Topic: General - Other >> Nov 03, 2024 10:45 AM Mercedes MATSU wrote: Reason for CRM: Johnnie calling from [facility]. She is Requesting [facility] insurance referral, and is done in the [facility] provider portal. Johnnie states that the referral needs to know the number of visits, diagnosis code.   Patient start 1/28 Dr. Mai Primary Diagnosis code : M05.79  Infusion treatment center patient as well, may need multiple visits, is requesting to not put a restrain on visits because she will be seen multiple times per year.    CB [phone] ext [phone]

## 2024-11-07 NOTE — Telephone Encounter (Signed)
 Spoke with pt and requested she send a device transmission per request of her PCP due to recent fall.  Pt verbalizes understanding and states she is sending transmission now. Will forward to device for further review.  Pt thanked [employer] for the call.

## 2024-11-07 NOTE — Telephone Encounter (Signed)
 I put the referral in.

## 2024-11-07 NOTE — Telephone Encounter (Signed)
 Manual transmission received and reviewed. Normal device function. Presenting rhythm AS/VP. AT/AF burden <1%. Battery longevity 9.7-10.3 years. Pt enrolled in remote F/U.   Will continue to monitor and update accordingly.

## 2024-11-08 ENCOUNTER — Ambulatory Visit: Admitting: Family Medicine

## 2024-11-08 ENCOUNTER — Encounter: Payer: Self-pay | Admitting: Family Medicine

## 2024-11-08 VITALS — BP 128/76 | HR 85 | Temp 97.6°F

## 2024-11-08 DIAGNOSIS — B029 Zoster without complications: Secondary | ICD-10-CM | POA: Diagnosis not present

## 2024-11-08 DIAGNOSIS — F4323 Adjustment disorder with mixed anxiety and depressed mood: Secondary | ICD-10-CM

## 2024-11-08 DIAGNOSIS — I480 Paroxysmal atrial fibrillation: Secondary | ICD-10-CM

## 2024-11-08 DIAGNOSIS — M0579 Rheumatoid arthritis with rheumatoid factor of multiple sites without organ or systems involvement: Secondary | ICD-10-CM

## 2024-11-08 DIAGNOSIS — R630 Anorexia: Secondary | ICD-10-CM

## 2024-11-08 DIAGNOSIS — Z950 Presence of cardiac pacemaker: Secondary | ICD-10-CM

## 2024-11-08 DIAGNOSIS — E1169 Type 2 diabetes mellitus with other specified complication: Secondary | ICD-10-CM | POA: Diagnosis not present

## 2024-11-08 DIAGNOSIS — E785 Hyperlipidemia, unspecified: Secondary | ICD-10-CM

## 2024-11-08 MED ORDER — GABAPENTIN 100 MG PO CAPS: 200.0000 mg | ORAL_CAPSULE | Freq: Two times a day (BID) | ORAL | 1 refills | Status: AC

## 2024-11-08 NOTE — Telephone Encounter (Signed)
 Spoke with pt and advised pacemaker function normal with no alerts.  Pt verbalizes understanding and thanked [employer] for the call.  Pt will plan to keep appointment as scheduled with Dr Inocencio on 11/15/24.

## 2024-11-08 NOTE — Assessment & Plan Note (Signed)
 More pain and swelling in knees today  More on right  Taking methotrexate and plaquenil  and prednisone    Rheumatology follow up upcoming

## 2024-11-08 NOTE — Progress Notes (Signed)
 "  Subjective:    Patient ID: [name], female    DOB: [birth year], 84 y.o.   MRN: [id number]  HPI  Wt Readings from Last 3 Encounters:  10/19/24 145 lb (65.8 kg)  09/28/24 143 lb (64.9 kg)  09/02/24 148 lb 6.4 oz (67.3 kg)      Vitals:   11/08/24 1522  BP: 128/76  Pulse: 85  Temp: 97.6 F (36.4 C)  SpO2: 98%    Pt presents for follow up of  Shingles  Depressed mood  Low appetite     Shingles  Seen last in 12/31 Prescription gabapentin  to help nerve pain 100 mg bid  Wondered if it may help anxiety and appetite also   Rash is dried up  Still hurts a lot   Pain is just slightly improved   For the last 2 days - appetite was a little better   Husband has to fix her food  Has to eat out   Has not had more falls since the last one    Patient Active Problem List   Diagnosis Date Noted   Paroxysmal atrial fibrillation (HCC) 11/08/2024   Shingles 10/19/2024   Moderate mitral regurgitation 08/09/2024   Presence of cardiac pacemaker 08/09/2024   Bradycardia 07/29/2024   Knee swelling 06/03/2024   Buttock pain 06/03/2024   Injury of right hip 02/02/2024   Coccyx pain 02/02/2024   Fall at home, initial encounter 02/02/2024   Estrogen deficiency 12/02/2023   Depressed mood 09/05/2023   Hypokalemia 09/04/2023   Decreased appetite 08/26/2023   History of UTI 08/26/2023   Heart murmur 08/27/2022   Mobility impaired 07/22/2022   Current use of proton pump inhibitor 10/01/2021   General weakness 10/01/2021   Poor balance 10/01/2021   On prednisone  therapy 10/01/2021   Diabetes mellitus treated with oral medication (HCC) 11/13/2020   Hyperlipidemia associated with type 2 diabetes mellitus (HCC) 10/15/2020   Fatigue 01/30/2020   Encounter for screening mammogram for breast cancer 10/27/2017   Routine general medical examination at a health care facility 10/27/2017   Aortic atherosclerosis 02/03/2017   RA (rheumatoid arthritis) (HCC)    Adjustment disorder  with mixed anxiety and depressed mood 06/24/2016   Essential hypertension 01/07/2016   Coronary atherosclerosis 11/07/2015   Osteoarthritis 05/10/2014   GERD 07/09/2007   Past Medical History:  Diagnosis Date   Allergic rhinitis    Cataract 2015   bilateral   COVID-19 10/2019   ASYMPTOMATIC   Diabetes mellitus without complication (HCC)    Diverticulosis    Esophageal reflux    Essential hypertension 01/07/2016   Fatty liver 2018   Fibula fracture 02/2005   Former smoker    Gastritis 2003   Heart murmur    MILD, NO CARDIOLOGIST   Hemorrhoids    History of cardiac dysrhythmia 2011   ABLATION  AT [facility]   History of kidney stones YRS AGO   Hyperglycemia    Hyperlipemia    Osteoarthritis    Personal history of colonic polyps 1998   hyperplastic   PMB (postmenopausal bleeding)    Pre-diabetes    BORDERLINE DIET CONTROLLED DOES NOT CHECK CBG   RA (rheumatoid arthritis) (HCC)    Rectal bleed 2018   TRANSFUSION GIVEN   Shingles 2012   Uterine fibroid    Past Surgical History:  Procedure Laterality Date   CHOLECYSTECTOMY  YRS AGO   COLONOSCOPY  11/08   internal hemorrhoids   DILATATION & CURETTAGE/HYSTEROSCOPY WITH MYOSURE N/A 07/06/2020  Procedure: DILATATION & CURETTAGE/HYSTEROSCOPY;  Surgeon: Arnaldo Purchase, MD;  Location: [facility];  Service: Gynecology;  Laterality: N/A;  request 7:30am OR time in [place] Gyn block requests 30 minutes   EP procedure  1994   SVT; normal echo   ESOPHAGOGASTRODUODENOSCOPY  11/03   reactive gastropathy   EYE SURGERY Bilateral 2017   CATARACTS   LIPOMA EXCISION  11/2002   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 ablation  2011   AT [facility]   TUBAL LIGATION  YRS AGO   vaginal polyp excised  12/2000   benign   Social History[1] Family History  Problem  Relation Age of Onset   Colon cancer Father 14   Diabetes Mother    Heart disease Mother    Kidney disease Mother        ESRD   Stroke Mother    Hypertension Brother    Breast cancer Sister 30   Diabetes Other        nephews   Colon cancer Sister    Esophageal cancer Neg Hx    Stomach cancer Neg Hx    Rectal cancer Neg Hx    Allergies[2] Medications Ordered Prior to Encounter[3]  Review of Systems  Constitutional:  Positive for appetite change and fatigue.  Musculoskeletal:  Positive for arthralgias, joint swelling and myalgias.  Skin:  Positive for rash.  Psychiatric/Behavioral:  Positive for dysphoric mood and sleep disturbance. Negative for suicidal ideas. The patient is nervous/anxious.        Objective:   Physical Exam Constitutional:      General: She is not in acute distress.    Appearance: Normal appearance. She is well-developed and normal weight. She is not ill-appearing or diaphoretic.  HENT:     Head: Normocephalic and atraumatic.     Mouth/Throat:     Mouth: Mucous membranes are moist.  Eyes:     General: No scleral icterus.    Conjunctiva/sclera: Conjunctivae normal.     Pupils: Pupils are equal, round, and reactive to light.  Neck:     Thyroid : No thyromegaly.     Vascular: No carotid bruit or JVD.  Cardiovascular:     Rate and Rhythm: Normal rate and regular rhythm.     Heart sounds: Normal heart sounds.     No gallop.  Pulmonary:     Effort: Pulmonary effort is normal. No respiratory distress.     Breath sounds: Normal breath sounds. No stridor. No wheezing, rhonchi or rales.  Abdominal:     General: There is no distension or abdominal bruit.     Palpations: Abdomen is soft.  Musculoskeletal:     Cervical back: Normal range of motion and neck supple.     Right lower leg: No edema.     Left lower leg: No edema.  Lymphadenopathy:     Cervical: No cervical adenopathy.  Skin:    General: Skin is warm and dry.     Coloration: Skin is not pale.      Findings: No rash.     Comments: Zoster rash (patchy/vesicular) - much improved on right shoulder and neck area  Drying up  Erythema is much less Some tenderness  Neurological:     Mental Status: She is alert.     Coordination: Coordination normal.     Deep Tendon Reflexes: Reflexes are  normal and symmetric. Reflexes normal.  Psychiatric:        Attention and Perception: Attention normal.        Mood and Affect: Mood is depressed.     Comments: Candidly discusses symptoms and stressors             Assessment & Plan:   Problem List Items Addressed This Visit       Cardiovascular and Mediastinum   Paroxysmal atrial fibrillation (HCC)   Regular rhythm today  HR 85 No clinical changes  Continues eliquis  [facility] f/u        Endocrine   Hyperlipidemia associated with type 2 diabetes mellitus (HCC)   Disc goals for lipids and reasons to control them Rev last labs with pt Rev low sat fat diet in detail Continues atorvastatin  every other day (most tolerated)  Lab Results  Component Value Date   CHOL 157 12/02/2023   HDL 34.10 (L) 12/02/2023   LDLCALC 76 12/02/2023   LDLDIRECT 60.0 10/26/2018   TRIG 234.0 (H) 12/02/2023   CHOLHDL 5 12/02/2023           Nervous and Auditory   Shingles - Primary    Right shoulder/neck  Rash is significantly improved  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   Pain continues but slight improvement (also improved appetite) Tolerating gabapentin  100 mg bid  No dizziness or new falls or sedation   Open to gradual increase in dose  Will increase to 200 mg bid (call if not tolerated)  Reviewed side effects in detail         Musculoskeletal and Integument   RA (rheumatoid arthritis) (HCC)   More pain and swelling in knees today  More on right  Taking methotrexate and plaquenil  and prednisone    Rheumatology follow up upcoming          Other   Presence of cardiac pacemaker   Under cardiology care Last fall was not caused  by arrhythmia  Continues follow up Overall doing well  Normal rate       Decreased appetite   Some improvement-last few days With increase calorie intake Also weight is up 2 lb from last visit  Unsure if gabapentin  may be helping or not   Plan to monitor as we increase dose for shingles neuralgia       Adjustment disorder with mixed anxiety and depressed mood   Due to chronic pain/health problems Affecting appetite  Per pt appetite did improve in last several days  Unsure if gabapentin  may improve mood slightly   Continues to decline any antidepressant medication          [1]  Social History Tobacco Use   Smoking status: Former    Current packs/day: 0.00    Average packs/day: 0.8 packs/day for 15.0 years (11.3 ttl pk-yrs)    Types: Cigarettes    Start date: 10/20/1988    Quit date: 10/21/2003    Years since quitting: 21.0   Smokeless tobacco: Never  Vaping Use   Vaping status: Never Used  Substance Use Topics   Alcohol use: No    Alcohol/week: 0.0 standard drinks of alcohol   Drug use: No  [2]  Allergies Allergen Reactions   Bee Venom Anaphylaxis   Dicyclomine      Blurry vision   Humira [Adalimumab]     LIPS SWELLED AND RASH   Metformin  And Related     rash  [3]  Current Outpatient Medications on File Prior to Visit  Medication Sig Dispense  Refill   ACCU-CHEK GUIDE TEST test strip USE TO CHECK BLOOD SUGAR DAILY 100 strip 1   Accu-Chek Softclix Lancets lancets Check glucose level daily and as needed for diabetes type 2  (lancets for the soft clicks lancing device AccuChek meter) 100 each 3   acetaminophen  (TYLENOL ) 325 MG tablet Take 650 mg by mouth every 6 (six) hours as needed.     albuterol  (PROVENTIL  HFA;VENTOLIN  HFA) 108 (90 Base) MCG/ACT inhaler Inhale 2 puffs into the lungs every 4 (four) hours as needed for wheezing or shortness of breath. Please give spacer if possible 1 Inhaler 3   amLODipine  (NORVASC ) 5 MG tablet TAKE 1 TABLET (5 MG TOTAL) BY MOUTH  DAILY. 90 tablet 1   apixaban  (ELIQUIS ) 5 MG TABS tablet Take 1 tablet (5 mg total) by mouth 2 (two) times daily. 28 tablet    atorvastatin  (LIPITOR) 10 MG tablet TAKE 0.5 TABLETS (5 MG TOTAL) BY MOUTH EVERY OTHER DAY. (Patient taking differently: Take 10 mg by mouth daily. Takes occasionally - about 2-3x week per patient) 22 tablet 3   blood glucose meter kit and supplies To check glucose daily and prn for DM2 E11.9 1 each 0   Blood Pressure Monitoring (ADULT BLOOD PRESSURE CUFF LG) KIT Blood pressure cuff size large for the arm to check blood pressure daily and as needed for HTN 1 kit 0   EPINEPHrine (EPIPEN IJ) Inject as directed as needed. Reported on 12/05/2015     fluticasone  (FLONASE ) 50 MCG/ACT nasal spray PLACE 2 SPRAYS INTO THE NOSE DAILY. 48 g 3   folic acid  (FOLVITE ) 1 MG tablet Take 1 mg by mouth daily.     hydroxychloroquine  (PLAQUENIL ) 200 MG tablet Take 200 mg by mouth daily.     loratadine  (CLARITIN ) 10 MG tablet Take 10 mg by mouth daily as needed for allergies. (Patient taking differently: Take 10 mg by mouth as needed for allergies.)     methotrexate (RHEUMATREX) 2.5 MG tablet Take 17.5 mg by mouth once a week. Caution:Chemotherapy. Protect from light. ( Tuesday of each week )     pantoprazole  (PROTONIX ) 40 MG tablet TAKE 1 TABLET BY MOUTH EVERY DAY 90 tablet 0   polyethylene glycol (MIRALAX  / GLYCOLAX ) packet Take 17 g by mouth daily.     triamcinolone  cream (KENALOG ) 0.1 % Apply 1 Application topically 2 (two) times daily as needed. On rash/affected area 15 g 0   No current facility-administered medications on file prior to visit.   "

## 2024-11-08 NOTE — Assessment & Plan Note (Signed)
 Disc goals for lipids and reasons to control them Rev last labs with pt Rev low sat fat diet in detail Continues atorvastatin  every other day (most tolerated)  Lab Results  Component Value Date   CHOL 157 12/02/2023   HDL 34.10 (L) 12/02/2023   LDLCALC 76 12/02/2023   LDLDIRECT 60.0 10/26/2018   TRIG 234.0 (H) 12/02/2023   CHOLHDL 5 12/02/2023

## 2024-11-08 NOTE — Assessment & Plan Note (Signed)
 Due to chronic pain/health problems Affecting appetite  Per pt appetite did improve in last several days  Unsure if gabapentin  may improve mood slightly   Continues to decline any antidepressant medication

## 2024-11-08 NOTE — Assessment & Plan Note (Addendum)
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

## 2024-11-08 NOTE — Assessment & Plan Note (Signed)
 Regular rhythm today  HR 85 No clinical changes  Continues eliquis  [facility] f/u

## 2024-11-08 NOTE — Assessment & Plan Note (Signed)
 Some improvement-last few days With increase calorie intake Also weight is up 2 lb from last visit  Unsure if gabapentin  may be helping or not   Plan to monitor as we increase dose for shingles neuralgia

## 2024-11-08 NOTE — Patient Instructions (Addendum)
 Go up on the gabapentin  to 200 mg (2 pills) twice daily   This is for shingles pain  It may also help back pain  It may also help mood (anxiety) and appetite    Watch closely for side effects Sedation / dizziness  If you don't tolerate the gabapentin  please call and let us  know !  The shingles rash looks much better  The pain will take longer to go away    Please reach out (mychart or phone) in about 2 weeks to update me with how your pain and appetite are

## 2024-11-08 NOTE — Assessment & Plan Note (Signed)
 Under cardiology care Last fall was not caused by arrhythmia  Continues follow up Overall doing well  Normal rate

## 2024-11-11 ENCOUNTER — Telehealth: Payer: Self-pay

## 2024-11-11 NOTE — Telephone Encounter (Signed)
 [name]

## 2024-11-11 NOTE — Telephone Encounter (Signed)
 Per chart review referral has been sent.  No further action needed at this time.

## 2024-11-15 ENCOUNTER — Ambulatory Visit: Admitting: Cardiology

## 2024-11-15 ENCOUNTER — Ambulatory Visit

## 2024-11-17 ENCOUNTER — Ambulatory Visit

## 2024-11-17 ENCOUNTER — Ambulatory Visit: Payer: Medicare Other

## 2024-11-17 VITALS — Ht 67.0 in | Wt 143.0 lb

## 2024-11-17 DIAGNOSIS — Z0000 Encounter for general adult medical examination without abnormal findings: Secondary | ICD-10-CM

## 2024-11-17 NOTE — Patient Instructions (Signed)
 [name],  Thank you for taking the time for your Medicare Wellness Visit. I appreciate your continued commitment to your health goals. Please review the care plan we discussed, and feel free to reach out if I can assist you further.  Please note that Annual Wellness Visits do not include a physical exam. Some assessments may be limited, especially if the visit was conducted virtually. If needed, we may recommend an in-person follow-up with your provider.  Ongoing Care Seeing your primary care provider every 3 to 6 months helps us  monitor your health and provide consistent, personalized care.   Referrals If a referral was made during today's visit and you haven't received any updates within two weeks, please contact the referred provider directly to check on the status.  Recommended Screenings:  Health Maintenance  Topic Date Due   Breast Cancer Screening  01/27/2024   Eye exam for diabetics  05/06/2024   Medicare Annual Wellness Visit  11/16/2024   Kidney health urinalysis for diabetes  12/02/2024   Flu Shot  01/17/2025*   Zoster (Shingles) Vaccine (2 of 2) 02/28/2025*   COVID-19 Vaccine (6 - 2025-26 season) 08/25/2026*   Hemoglobin A1C  02/07/2025   Yearly kidney function blood test for diabetes  08/09/2025   DTaP/Tdap/Td vaccine (3 - Tdap) 02/05/2033   Pneumococcal Vaccine for age over 39  Completed   Osteoporosis screening with Bone Density Scan  Completed   Meningitis B Vaccine  Aged Out   Complete foot exam   Discontinued  *Topic was postponed. The date shown is not the original due date.       11/17/2024   11:02 AM  Advanced Directives  Does Patient Have a Medical Advance Directive? Yes  Type of Advance Directive Healthcare Power of Attorney  Copy of Healthcare Power of Attorney in Chart? No - copy requested    Vision: Annual vision screenings are recommended for early detection of glaucoma, cataracts, and diabetic retinopathy. These exams can also reveal signs of  chronic conditions such as diabetes and high blood pressure.  Dental: Annual dental screenings help detect early signs of oral cancer, gum disease, and other conditions linked to overall health, including heart disease and diabetes.  Please see the attached documents for additional preventive care recommendations.

## 2024-11-17 NOTE — Progress Notes (Signed)
 "  Chief Complaint  Patient presents with   Medicare Wellness     Subjective:   [name] is a 84 y.o. female who presents for a Medicare Annual Wellness Visit.  Visit info / Clinical Intake: Medicare Wellness Visit Type:: Subsequent Annual Wellness Visit Persons participating in visit and providing information:: patient Medicare Wellness Visit Mode:: Telephone If telephone:: video declined Since this visit was completed virtually, some vitals may be partially provided or unavailable. Missing vitals are due to the limitations of the virtual format.: Documented vitals are patient reported If Telephone or Video please confirm:: I connected with patient using audio/video enable telemedicine. I verified patient identity with two identifiers, discussed telehealth limitations, and patient agreed to proceed. Patient Location:: home Provider Location:: office Interpreter Needed?: No Pre-visit prep was completed: yes AWV questionnaire completed by patient prior to visit?: no Living arrangements:: lives with spouse/significant other Patient's Overall Health Status Rating: (!) fair Typical amount of pain: (!) a lot Does pain affect daily life?: (!) yes Are you currently prescribed opioids?: no  Dietary Habits and Nutritional Risks How many meals a day?: 2 Eats fruit and vegetables daily?: yes Most meals are obtained by: eating out In the last 2 weeks, have you had any of the following?: none Diabetic:: (!) yes Any non-healing wounds?: no How often do you check your BS?: 0 Would you like to be referred to a Nutritionist or for Diabetic Management? : no  Functional Status Activities of Daily Living (to include ambulation/medication): (!) Needs Assist Feeding: Independent Dressing/Grooming: Needs assistance (husband helps) Bathing: Independent Toileting: Independent Transfer: Independent Ambulation: Independent with device- listed below Home Assistive Devices/Equipment: Walker  (specify Type) Medication Administration: Needs assistance (comment) (daughter sets up medication) Home Management (perform basic housework or laundry): Needs assistance (comment) Manage your own finances?: (!) no Primary transportation is: family / friends Concerns about vision?: no *vision screening is required for WTM* Concerns about hearing?: no  Fall Screening Falls in the past year?: 1 Number of falls in past year: 1 Was there an injury with Fall?: 0 Fall Risk Category Calculator: 2 Patient Fall Risk Level: Moderate Fall Risk  Fall Risk Patient at Risk for Falls Due to: Impaired balance/gait; Impaired mobility; Medication side effect Fall risk Follow up: Falls prevention discussed; Education provided; Falls evaluation completed  Home and Transportation Safety: All rugs have non-skid backing?: N/A, no rugs All stairs or steps have railings?: N/A, no stairs Grab bars in the bathtub or shower?: (!) no Have non-skid surface in bathtub or shower?: yes Good home lighting?: yes Regular seat belt use?: yes Hospital stays in the last year:: (!) yes How many hospital stays:: 1 Reason: pacemaker implant  Cognitive Assessment Difficulty concentrating, remembering, or making decisions? : no Will 6CIT or Mini Cog be Completed: yes What year is it?: 0 points What month is it?: 0 points Give patient an address phrase to remember (5 components): [address] [place] About what time is it?: 0 points Count backwards from 20 to 1: 4 points Say the months of the year in reverse: 4 points Repeat the address phrase from earlier: 10 points 6 CIT Score: 18 points  Advance Directives (For Healthcare) Does Patient Have a Medical Advance Directive?: Yes Type of Advance Directive: Healthcare Power of Attorney Copy of Healthcare Power of Attorney in Chart?: No - copy requested Copy of Living Will in Chart?: No - copy requested Would patient like information on creating a medical advance  directive?: Yes (Inpatient -  patient defers creating a medical advance directive and declines information at this time)  Reviewed/Updated  Reviewed/Updated: Reviewed All (Medical, Surgical, Family, Medications, Allergies, Care Teams, Patient Goals)    Allergies (verified) Bee venom, Dicyclomine , Humira [adalimumab], and Metformin  and related   Current Medications (verified) Outpatient Encounter Medications as of 11/17/2024  Medication Sig   acetaminophen  (TYLENOL ) 325 MG tablet Take 650 mg by mouth every 6 (six) hours as needed.   albuterol  (PROVENTIL  HFA;VENTOLIN  HFA) 108 (90 Base) MCG/ACT inhaler Inhale 2 puffs into the lungs every 4 (four) hours as needed for wheezing or shortness of breath. Please give spacer if possible   amLODipine  (NORVASC ) 5 MG tablet TAKE 1 TABLET (5 MG TOTAL) BY MOUTH DAILY.   apixaban  (ELIQUIS ) 5 MG TABS tablet Take 1 tablet (5 mg total) by mouth 2 (two) times daily.   atorvastatin  (LIPITOR) 10 MG tablet TAKE 0.5 TABLETS (5 MG TOTAL) BY MOUTH EVERY OTHER DAY. (Patient taking differently: Take 10 mg by mouth daily. Takes occasionally - about 2-3x week per patient)   EPINEPHrine (EPIPEN IJ) Inject as directed as needed. Reported on 12/05/2015   folic acid  (FOLVITE ) 1 MG tablet Take 1 mg by mouth daily.   gabapentin  (NEURONTIN ) 100 MG capsule Take 2 capsules (200 mg total) by mouth 2 (two) times daily.   hydroxychloroquine  (PLAQUENIL ) 200 MG tablet Take 200 mg by mouth daily.   loratadine  (CLARITIN ) 10 MG tablet Take 10 mg by mouth daily as needed for allergies. (Patient taking differently: Take 10 mg by mouth as needed for allergies.)   methotrexate (RHEUMATREX) 2.5 MG tablet Take 17.5 mg by mouth once a week. Caution:Chemotherapy. Protect from light. ( Tuesday of each week )   pantoprazole  (PROTONIX ) 40 MG tablet TAKE 1 TABLET BY MOUTH EVERY DAY   polyethylene glycol (MIRALAX  / GLYCOLAX ) packet Take 17 g by mouth daily.   triamcinolone  cream (KENALOG ) 0.1 % Apply 1  Application topically 2 (two) times daily as needed. On rash/affected area   ACCU-CHEK GUIDE TEST test strip USE TO CHECK BLOOD SUGAR DAILY (Patient not taking: Reported on 11/17/2024)   Accu-Chek Softclix Lancets lancets Check glucose level daily and as needed for diabetes type 2  (lancets for the soft clicks lancing device AccuChek meter) (Patient not taking: Reported on 11/17/2024)   blood glucose meter kit and supplies To check glucose daily and prn for DM2 E11.9 (Patient not taking: Reported on 11/17/2024)   Blood Pressure Monitoring (ADULT BLOOD PRESSURE CUFF LG) KIT Blood pressure cuff size large for the arm to check blood pressure daily and as needed for HTN (Patient not taking: Reported on 11/17/2024)   fluticasone  (FLONASE ) 50 MCG/ACT nasal spray PLACE 2 SPRAYS INTO THE NOSE DAILY. (Patient not taking: Reported on 11/17/2024)   No facility-administered encounter medications on file as of 11/17/2024.    History: Past Medical History:  Diagnosis Date   Allergic rhinitis    Cataract 2015   bilateral   COVID-19 10/2019   ASYMPTOMATIC   Diabetes mellitus without complication (HCC)    Diverticulosis    Esophageal reflux    Essential hypertension 01/07/2016   Fatty liver 2018   Fibula fracture 02/2005   Former smoker    Gastritis 2003   Heart murmur    MILD, NO CARDIOLOGIST   Hemorrhoids    History of cardiac dysrhythmia 2011   ABLATION  AT [facility]   History of kidney stones YRS AGO   Hyperglycemia    Hyperlipemia    Osteoarthritis  Personal history of colonic polyps 1998   hyperplastic   PMB (postmenopausal bleeding)    Pre-diabetes    BORDERLINE DIET CONTROLLED DOES NOT CHECK CBG   RA (rheumatoid arthritis) (HCC)    Rectal bleed 2018   TRANSFUSION GIVEN   Shingles 2012   Uterine fibroid    Past Surgical History:  Procedure Laterality Date   CHOLECYSTECTOMY  YRS AGO   COLONOSCOPY  11/08   internal hemorrhoids   DILATATION & CURETTAGE/HYSTEROSCOPY WITH MYOSURE N/A  07/06/2020   Procedure: DILATATION & CURETTAGE/HYSTEROSCOPY;  Surgeon: Arnaldo Purchase, MD;  Location: [facility];  Service: Gynecology;  Laterality: N/A;  request 7:30am OR time in [place] Gyn block requests 30 minutes   EP procedure  1994   SVT; normal echo   ESOPHAGOGASTRODUODENOSCOPY  11/03   reactive gastropathy   EYE SURGERY Bilateral 2017   CATARACTS   LIPOMA EXCISION  11/2002   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 ablation  2011   AT [facility]   TUBAL LIGATION  YRS AGO   vaginal polyp excised  12/2000   benign   Family History  Problem Relation Age of Onset   Colon cancer Father 48   Diabetes Mother    Heart disease Mother    Kidney disease Mother        ESRD   Stroke Mother    Hypertension Brother    Breast cancer Sister 19   Diabetes Other        nephews   Colon cancer Sister    Esophageal cancer Neg Hx    Stomach cancer Neg Hx    Rectal cancer Neg Hx    Social History   Occupational History   Occupation: retired    Associate Professor: RETIRED  Tobacco Use   Smoking status: Former    Current packs/day: 0.00    Average packs/day: 0.8 packs/day for 15.0 years (11.3 ttl pk-yrs)    Types: Cigarettes    Start date: 10/20/1988    Quit date: 10/21/2003    Years since quitting: 21.0   Smokeless tobacco: Never  Vaping Use   Vaping status: Never Used  Substance and Sexual Activity   Alcohol use: No    Alcohol/week: 0.0 standard drinks of alcohol   Drug use: No   Sexual activity: Yes    Comment: 1st intercourse 84 yo-Fewer than 5 partners   Tobacco Counseling Counseling given: Not Answered  SDOH Screenings   Food Insecurity: No Food Insecurity (11/17/2024)  Housing: Unknown (11/17/2024)  Transportation Needs: No Transportation Needs (11/17/2024)  Utilities: Not At Risk (11/17/2024)  Alcohol  Screen: Low Risk (11/17/2024)  Depression (PHQ2-9): High Risk (11/17/2024)  Financial Resource Strain: Low Risk (11/17/2024)  Physical Activity: Inactive (11/17/2024)  Social Connections: Moderately Isolated (11/17/2024)  Stress: No Stress Concern Present (11/17/2024)  Tobacco Use: Medium Risk (11/17/2024)  Health Literacy: Adequate Health Literacy (11/17/2024)   See flowsheets for full screening details  Depression Screen PHQ 2 & 9 Depression Scale- Over the past 2 weeks, how often have you been bothered by any of the following problems? Little interest or pleasure in doing things: 1 Feeling down, depressed, or hopeless (PHQ Adolescent also includes...irritable): 3 (due to arthritis) PHQ-2 Total Score: 4 Trouble falling or staying asleep, or sleeping too much: 2 (due to pain) Feeling tired or having little energy: 3 Poor appetite  or overeating (PHQ Adolescent also includes...weight loss): 1 Feeling bad about yourself - or that you are a failure or have let yourself or your family down: 1 Trouble concentrating on things, such as reading the newspaper or watching television (PHQ Adolescent also includes...like school work): 0 Moving or speaking so slowly that other people could have noticed. Or the opposite - being so fidgety or restless that you have been moving around a lot more than usual: 0 Thoughts that you would be better off dead, or of hurting yourself in some way: 0 PHQ-9 Total Score: 11 If you checked off any problems, how difficult have these problems made it for you to do your work, take care of things at home, or get along with other people?: Somewhat difficult  Depression Treatment Depression Interventions/Treatment : Patient refuses Treatment     Goals Addressed               This Visit's Progress     denies goals (pt-stated)               Objective:    Today's Vitals   11/17/24 1050  Weight: 143 lb (64.9 kg)  Height: 5' 7 (1.702 m)   Body mass index is 22.4  kg/m.  Hearing/Vision screen Vision Screening - Comments:: No regular eye exams Immunizations and Health Maintenance Health Maintenance  Topic Date Due   Mammogram  01/27/2024   OPHTHALMOLOGY EXAM  05/06/2024   Diabetic kidney evaluation - Urine ACR  12/02/2024   Influenza Vaccine  01/17/2025 (Originally 05/20/2024)   Zoster Vaccines- Shingrix (2 of 2) 02/28/2025 (Originally 01/07/2023)   COVID-19 Vaccine (6 - 2025-26 season) 08/25/2026 (Originally 06/20/2024)   HEMOGLOBIN A1C  02/07/2025   Diabetic kidney evaluation - eGFR measurement  08/09/2025   Medicare Annual Wellness (AWV)  11/17/2025   DTaP/Tdap/Td (3 - Tdap) 02/05/2033   Pneumococcal Vaccine: 50+ Years  Completed   Bone Density Scan  Completed   Meningococcal B Vaccine  Aged Out   FOOT EXAM  Discontinued        Assessment/Plan:  This is a routine wellness examination for [name].  Patient Care Team: Tower, Laine LABOR, MD as PCP - General (Family Medicine) Inocencio Soyla Lunger, MD as PCP - Electrophysiology (Cardiology) Okey Vina GAILS, MD as Consulting Physician (Cardiology) Aneita Gwendlyn DASEN, MD (Inactive) as Consulting Physician (Gastroenterology) [facility], [place], Chance, LCSW as Social Worker Devra Lands, RN as [facility] Mai Lynwood FALCON, MD as Consulting Physician (Rheumatology)  I have personally reviewed and noted the following in the patients chart:   Medical and social history Use of alcohol, tobacco or illicit drugs  Current medications and supplements including opioid prescriptions. Functional ability and status Nutritional status Physical activity Advanced directives List of other physicians Hospitalizations, surgeries, and ER visits in previous 12 months Vitals Screenings to include cognitive, depression, and falls Referrals and appointments  No orders of the defined types were placed in this encounter.  In addition, I have reviewed and discussed with patient  certain preventive protocols, quality metrics, and best practice recommendations. A written personalized care plan for preventive services as well as general preventive health recommendations were provided to patient.   Ardella FORBES Dawn, LPN   8/70/7973   Return in 1 year (on 11/17/2025).  After Visit Summary: (In Person-Declined) Patient declined AVS at this time.  Nurse Notes: Screenings not ordered: Declined Referral/Order for mammogram HM Addressed: Looking for a new eye doctor  "

## 2024-11-18 ENCOUNTER — Ambulatory Visit

## 2024-11-29 ENCOUNTER — Telehealth

## 2024-12-12 ENCOUNTER — Ambulatory Visit

## 2024-12-22 ENCOUNTER — Ambulatory Visit: Admitting: Cardiology

## 2025-02-14 ENCOUNTER — Ambulatory Visit

## 2025-05-16 ENCOUNTER — Ambulatory Visit

## 2025-08-15 ENCOUNTER — Ambulatory Visit

## 2025-11-14 ENCOUNTER — Ambulatory Visit
